# Patient Record
Sex: Female | Born: 1935
Health system: Southern US, Community
[De-identification: ages and names within clinical notes are randomized; demographics above are authoritative.]

## PROBLEM LIST (undated history)

## (undated) DIAGNOSIS — I10 Essential (primary) hypertension: Secondary | ICD-10-CM

## (undated) DIAGNOSIS — Z950 Presence of cardiac pacemaker: Secondary | ICD-10-CM

## (undated) DIAGNOSIS — E538 Deficiency of other specified B group vitamins: Secondary | ICD-10-CM

## (undated) DIAGNOSIS — I499 Cardiac arrhythmia, unspecified: Secondary | ICD-10-CM

## (undated) DIAGNOSIS — Z90710 Acquired absence of both cervix and uterus: Secondary | ICD-10-CM

## (undated) DIAGNOSIS — R0600 Dyspnea, unspecified: Secondary | ICD-10-CM

## (undated) DIAGNOSIS — IMO0001 Reserved for inherently not codable concepts without codable children: Secondary | ICD-10-CM

## (undated) DIAGNOSIS — C50919 Malignant neoplasm of unspecified site of unspecified female breast: Secondary | ICD-10-CM

## (undated) DIAGNOSIS — E1110 Type 2 diabetes mellitus with ketoacidosis without coma: Secondary | ICD-10-CM

## (undated) DIAGNOSIS — C50912 Malignant neoplasm of unspecified site of left female breast: Secondary | ICD-10-CM

## (undated) DIAGNOSIS — E785 Hyperlipidemia, unspecified: Secondary | ICD-10-CM

## (undated) DIAGNOSIS — N189 Chronic kidney disease, unspecified: Secondary | ICD-10-CM

## (undated) DIAGNOSIS — G839 Paralytic syndrome, unspecified: Secondary | ICD-10-CM

## (undated) DIAGNOSIS — Z9289 Personal history of other medical treatment: Secondary | ICD-10-CM

## (undated) DIAGNOSIS — E039 Hypothyroidism, unspecified: Secondary | ICD-10-CM

## (undated) DIAGNOSIS — Z9889 Other specified postprocedural states: Secondary | ICD-10-CM

## (undated) DIAGNOSIS — I4891 Unspecified atrial fibrillation: Secondary | ICD-10-CM

## (undated) HISTORY — DX: Essential (primary) hypertension: I10

## (undated) HISTORY — PX: BREAST LUMPECTOMY: SHX2

## (undated) HISTORY — PX: OTHER SURGICAL HISTORY: SHX169

## (undated) HISTORY — DX: Hyperlipidemia, unspecified: E78.5

## (undated) HISTORY — PX: BACK SURGERY: SHX140

## (undated) HISTORY — DX: Personal history of other medical treatment: Z92.89

## (undated) HISTORY — DX: Reserved for inherently not codable concepts without codable children: IMO0001

## (undated) HISTORY — PX: LAPAROSCOPIC CHOLECYSTECTOMY: SUR755

## (undated) HISTORY — PX: BREAST EXCISIONAL BIOPSY: SUR124

## (undated) HISTORY — DX: Acquired absence of both cervix and uterus: Z90.710

## (undated) HISTORY — DX: Other specified postprocedural states: Z98.890

## (undated) HISTORY — PX: ROTATOR CUFF REPAIR: SHX139

## (undated) HISTORY — PX: INSERT / REPLACE / REMOVE PACEMAKER: SUR710

## (undated) HISTORY — DX: Unspecified atrial fibrillation: I48.91

## (undated) HISTORY — PX: TUBAL LIGATION: SHX77

## (undated) HISTORY — DX: Type 2 diabetes mellitus with ketoacidosis without coma: E11.10

## (undated) HISTORY — PX: ABDOMINAL HYSTERECTOMY: SHX81

## (undated) HISTORY — DX: Deficiency of other specified B group vitamins: E53.8

---

## 1999-05-04 ENCOUNTER — Encounter (HOSPITAL_BASED_OUTPATIENT_CLINIC_OR_DEPARTMENT_OTHER): Payer: Self-pay | Admitting: Internal Medicine

## 1999-05-04 ENCOUNTER — Encounter: Admission: RE | Admit: 1999-05-04 | Discharge: 1999-05-04 | Payer: Self-pay | Admitting: Internal Medicine

## 1999-05-12 ENCOUNTER — Encounter (HOSPITAL_BASED_OUTPATIENT_CLINIC_OR_DEPARTMENT_OTHER): Payer: Self-pay | Admitting: Internal Medicine

## 1999-05-12 ENCOUNTER — Encounter: Admission: RE | Admit: 1999-05-12 | Discharge: 1999-05-12 | Payer: Self-pay | Admitting: Internal Medicine

## 2000-12-27 ENCOUNTER — Encounter (HOSPITAL_BASED_OUTPATIENT_CLINIC_OR_DEPARTMENT_OTHER): Payer: Self-pay | Admitting: Internal Medicine

## 2000-12-27 ENCOUNTER — Encounter: Admission: RE | Admit: 2000-12-27 | Discharge: 2000-12-27 | Payer: Self-pay | Admitting: Internal Medicine

## 2001-02-13 ENCOUNTER — Ambulatory Visit (HOSPITAL_COMMUNITY): Admission: RE | Admit: 2001-02-13 | Discharge: 2001-02-13 | Payer: Self-pay | Admitting: Gastroenterology

## 2001-10-09 ENCOUNTER — Encounter: Payer: Self-pay | Admitting: Urology

## 2001-10-14 ENCOUNTER — Observation Stay (HOSPITAL_COMMUNITY): Admission: RE | Admit: 2001-10-14 | Discharge: 2001-10-15 | Payer: Self-pay | Admitting: Urology

## 2002-05-19 ENCOUNTER — Encounter: Payer: Self-pay | Admitting: Internal Medicine

## 2002-05-19 ENCOUNTER — Encounter: Admission: RE | Admit: 2002-05-19 | Discharge: 2002-05-19 | Payer: Self-pay | Admitting: Internal Medicine

## 2003-06-12 ENCOUNTER — Encounter: Admission: RE | Admit: 2003-06-12 | Discharge: 2003-06-12 | Payer: Self-pay | Admitting: Urology

## 2003-06-22 ENCOUNTER — Ambulatory Visit (HOSPITAL_BASED_OUTPATIENT_CLINIC_OR_DEPARTMENT_OTHER): Admission: RE | Admit: 2003-06-22 | Discharge: 2003-06-22 | Payer: Self-pay | Admitting: Urology

## 2003-06-22 ENCOUNTER — Ambulatory Visit (HOSPITAL_COMMUNITY): Admission: RE | Admit: 2003-06-22 | Discharge: 2003-06-22 | Payer: Self-pay | Admitting: Urology

## 2003-12-11 ENCOUNTER — Encounter (INDEPENDENT_AMBULATORY_CARE_PROVIDER_SITE_OTHER): Payer: Self-pay | Admitting: *Deleted

## 2003-12-11 ENCOUNTER — Encounter: Admission: RE | Admit: 2003-12-11 | Discharge: 2003-12-11 | Payer: Self-pay | Admitting: Internal Medicine

## 2004-01-14 ENCOUNTER — Encounter: Admission: RE | Admit: 2004-01-14 | Discharge: 2004-01-14 | Payer: Self-pay | Admitting: Internal Medicine

## 2004-08-17 ENCOUNTER — Ambulatory Visit (HOSPITAL_COMMUNITY): Admission: RE | Admit: 2004-08-17 | Discharge: 2004-08-17 | Payer: Self-pay | Admitting: Internal Medicine

## 2004-09-13 ENCOUNTER — Observation Stay (HOSPITAL_COMMUNITY): Admission: EM | Admit: 2004-09-13 | Discharge: 2004-09-14 | Payer: Self-pay | Admitting: Emergency Medicine

## 2004-09-13 ENCOUNTER — Encounter (INDEPENDENT_AMBULATORY_CARE_PROVIDER_SITE_OTHER): Payer: Self-pay | Admitting: Specialist

## 2004-12-21 ENCOUNTER — Encounter: Admission: RE | Admit: 2004-12-21 | Discharge: 2004-12-21 | Payer: Self-pay | Admitting: Internal Medicine

## 2006-01-11 ENCOUNTER — Encounter: Admission: RE | Admit: 2006-01-11 | Discharge: 2006-01-11 | Payer: Self-pay | Admitting: Internal Medicine

## 2007-01-21 ENCOUNTER — Encounter: Admission: RE | Admit: 2007-01-21 | Discharge: 2007-01-21 | Payer: Self-pay | Admitting: Internal Medicine

## 2008-06-02 ENCOUNTER — Encounter: Admission: RE | Admit: 2008-06-02 | Discharge: 2008-06-02 | Payer: Self-pay | Admitting: Internal Medicine

## 2009-06-17 ENCOUNTER — Encounter: Admission: RE | Admit: 2009-06-17 | Discharge: 2009-06-17 | Payer: Self-pay | Admitting: Internal Medicine

## 2009-08-04 DIAGNOSIS — Z9289 Personal history of other medical treatment: Secondary | ICD-10-CM

## 2009-08-04 HISTORY — DX: Personal history of other medical treatment: Z92.89

## 2010-01-25 DIAGNOSIS — Z9889 Other specified postprocedural states: Secondary | ICD-10-CM

## 2010-01-25 HISTORY — DX: Other specified postprocedural states: Z98.890

## 2010-04-03 ENCOUNTER — Encounter: Payer: Self-pay | Admitting: Internal Medicine

## 2010-07-29 NOTE — Op Note (Signed)
[name], Summer Hawkins                            ACCOUNT NO.:  [id number]   MEDICAL RECORD NO.:  [id number]                   PATIENT TYPE:  AMB   LOCATION:  [facility]                                 FACILITY:  [facility]   PHYSICIAN:  Sigmund I. Patsi Sears, M.D.         DATE OF BIRTH:  1934-09-11   DATE OF PROCEDURE:  06/22/2003  DATE OF DISCHARGE:                                 OPERATIVE REPORT   PREOPERATIVE DIAGNOSIS:  Sphincteric urinary incontinence.   POSTOPERATIVE DIAGNOSIS:  Sphincteric urinary incontinence.   OPERATION/PROCEDURE:  Transurethral collagen injection.   SURGEON:  Sigmund I. Patsi Sears, M.D.   ANESTHESIA:  LMA.   PREPARATION:  After appropriate preanesthesia, the patient was brought to  the operating room and placed on the operating table in the dorsal supine  position.  General LMA anesthesia was introduced.  She was then replaced in  the dorsal lithotomy position where the pubis was prepped with Betadine  solution and draped in the usual fashion.   REVIEW OF HISTORY:  [name] is a 76 year old, obese, diabetic female  who has lost from 235 pounds to 195 pounds in the last year.  Status post  hysterectomy in 1980, and now status post pubovaginal sling in August 2003.  She recently had a leak point pressure of 14 cm of pressure and is  approximately 90% better after a pubovaginal sling surgery.  She now has  incontinence which has not been treatable by anticholinergics and is for  collagen injection.  The patient is status post urodynamics which show a  peak flow of 7.7 mL per second, low leak point pressure, decrease in bladder  compliance, multiple phasic contractions.  She has been treated with  multiple anticholinergics including __________ and multiple oral  medications, but these have not helped her incontinence.  She is now for  collagen injection.   DESCRIPTION OF PROCEDURE:  With the patient in the lithotomy position,  cystoscope was placed and an open  urethra and bladder neck was noted.  Two  vials of collagen were injected and excellent closure of the bladder neck  and urethra were obtained.  A 12-French catheter was used to drain the  bladder.  The patient was then awakened and taken to the recovery room in  good condition.  She had B&O suppository to start at the beginning of the  case.                                               Sigmund I. Patsi Sears, M.D.    SIT/MEDQ  D:  06/22/2003  T:  06/22/2003  Job:  [id number]

## 2010-07-29 NOTE — Op Note (Signed)
NAMEGEORGIANNE, GRITZ                ACCOUNT NO.:  [id number]   MEDICAL RECORD NO.:  [id number]          PATIENT TYPE:  INP   LOCATION:  1617                         FACILITY:  [facility]   PHYSICIAN:  Angelia Mould. Derrell Lolling, M.D.DATE OF BIRTH:  1934-07-09   DATE OF PROCEDURE:  DATE OF DISCHARGE:                                 OPERATIVE REPORT   PREOPERATIVE DIAGNOSIS:  Subacute cholecystitis with cholelithiasis.   POSTOPERATIVE DIAGNOSIS:  Subacute cholecystitis with cholelithiasis.   OPERATION PERFORMED:  Laparoscopic cholecystectomy with intraoperative  cholangiogram.   SURGEON:  Angelia Mould. Derrell Lolling, M.D.   FIRST ASSISTANT:  Velora Heckler, MD   OPERATIVE INDICATIONS:  This is a 76 year old white female with diabetes,  hypertension, hyperlipidemia, anemia, and hypothyroidism. For 3 months she  has been having intermittent episodes of right upper quadrant pain. She had  ultrasound about 1 month ago which showed several small gallstones, but no  other abnormalities. I saw her in the office about 3 weeks ago; and we had  planned elective cholecystectomy on July7. She states that she has continued  to have episodes of right upper quadrant pain and states that she had pain  all night last night. She came to the emergency room today. Her lab work was  normal and on exam she is found to be tender in the right upper quadrant,  but without any signs of peritonitis. I offered her cholecystectomy today;  and she stated that she would very much like have that done. She is brought  to operating room for that surgery.   OPERATIVE FINDINGS:  The gallbladder was thin-walled and chronically  inflamed but was not acutely inflamed. The cystic duct was tiny. The  cholangiogram was normal. The biliary tree was very small caliber, the  anatomy of the biliary tree was normal, the cholangiogram showed no filling  defects and no blockage with good flow of contrast into the duodenum. The  liver, stomach,  duodenum, small intestine and large intestine were grossly  normal to inspection.  I saw no evidence of any other inflammatory process.   OPERATIVE TECHNIQUE:  Following induction of general endotracheal  anesthesia. Intravenous antibiotics were given. The abdomen was prepped and  draped in sterile fashion. Marcaine 0.5% with epinephrine was used as a  local infiltration anesthetic. Transverse incision was made at the lower rim  of the umbilicus through a previous laparoscopy scar. The fascia was incised  in midline. The abdominal cavity was entered under direct vision. A 10-mm  Hassan trocar was inserted and secured with a pursestring suture of #0  Vicryl. A pneumoperitoneum was created. Video camera was inserted with  visualization and findings as described above. A 10-mm camera was inserted.  We found no abnormalities.   A 10-mm trocar was placed in the subxiphoid region and two 5-mm trocars  placed in the right midabdomen. The gallbladder fundus was elevated. We had  a lot of adhesions of omentum and duodenum to the gallbladder, but these  were chronic adhesions. These were taken down in a very straight-forward  fashion without any injury. The infundibulum was thus  identified and  retracted laterally. We dissected out the cystic duct and the cystic artery.  We created a large window behind the cystic duct. A metal clip was placed on  the cystic duct close to the gallbladder. A cholangiogram catheter was  inserted into the cystic duct. A cholangiogram was obtained using the C-arm.  This showed normal intrahepatic and extrahepatic biliary anatomy, although  the bile ducts were very small in caliber. There was no filling defect and  no obstruction. The cholangiogram catheter was removed.   The cystic duct was secured with multiple metal clips and divided. The  cystic artery was then secured with multiple metal clips and divided. The  gallbladder was dissected from its bed. We created  one small hole in the  gallbladder and spilled some clear bile. This was evacuated. The gallbladder  was placed in the specimen bag and removed. The operative field was  inspected and copiously irrigated. After irrigating extensively, the  irrigation fluid was completely clear, there was no bleeding and no bile  leak whatsoever. The trocars were removed under direct vision; and there was  no bleeding from trocar sites. The pneumoperitoneum was released. The fascia  at the umbilicus was closed with #0 Vicryl sutures. Skin incisions were  closed with subcuticular sutures of 4-0 Monocryl and interrupted sutures of  4-0 nylon. Clean bandages were placed; and the patient taken recovery room  in stable condition. Estimated blood loss was about 10 mL. Complications  were none  Sponge and needle counts were correct.       HMI/MEDQ  D:  09/13/2004  T:  09/13/2004  Job:  [id number]   cc:   Gaspar Garbe, M.D.  [address]  [place]  [place] [address]  Fax: [phone]

## 2010-07-29 NOTE — Op Note (Signed)
Summer Hawkins, Summer Hawkins                         ACCOUNT NO.:  [id number]   MEDICAL RECORD NO.:  [id number]                   PATIENT TYPE:  AMB   LOCATION:  DAY                                  FACILITY:  [facility]   PHYSICIAN:  Sigmund I. Patsi Sears, M.D.         DATE OF BIRTH:  [birth year]   DATE OF PROCEDURE:  DATE OF DISCHARGE:                                 OPERATIVE REPORT   PREOPERATIVE DIAGNOSES:  Urinary sphincteric incontinence.   POSTOPERATIVE DIAGNOSES:  Urinary sphincteric incontinence.   OPERATION:  Sparc pubovaginal sling.   SURGEON:  Sigmund I. Patsi Sears, M.D.   ANESTHESIA:  General (LMA).   PREPARATION:  After appropriate anesthesia, the patient was brought to the  operating room and placed on the operative table in the dorsal supine  position.  General LMA anesthesia was introduced.  She was then re-placed in  the dorsal lithotomy position, where the pubis was prepped with Betadine  solution and draped in usual sterile fashion.   REVIEW OF HISTORY:  This 76 year old obese female weighs 233 pounds,  diabetic with hypertension, is status post hysterectomy in 1980.  Currently  complains of urinary incontinence, mixed urge and stress.  She had  urodynamics showing a 14 cm leak point pressures, and has not been cured  with anticholinergics.  She is now for a pubovaginal sling.   PHYSICAL EXAMINATION:  Did not show a symptomatic cystocele or rectocele or  enterocele.   The patient is a four pad/day incontinence.   PROCEDURE:  Vaginal inspection under anesthesia again shows minimal, grade 1  cystocele, and no rectocele and no enterocele.  It was elected to proceed  with Sparc pubovaginal sling, and 4 cc of Marcaine 0.5% with epinephrine  1:200,000 was injected into the midline along the urethral surface of the  vaginal mucosa.  A 2 cm incision was made in the midline of the urethra,  distal to the palpable bladder neck.  Dissection was accomplished  bilaterally,  and bilateral pubic incisions are made two fingerbreadths above  the pubic tubercle on the right and left sides of the pelvis.  These  incisions measure approximately 1 cm each.  Subcutaneous tissue was  dissected with electrosurgical __________ .  Using the special Sparc  needles, the Sparc sling was brought into the retropubic space, with a right  angle behind the midline of the Sparc itself.  Each Sparc sleeve is cut, and  the plastic removed.  A right angled clamp is kept behind the midline to  ensure that the Sparc is not too tight.  Following this, cystoscopy had  previously been accomplished with the needles in place, with both the 12-  degree and 7-degree lenses; showing that there was no evidence of any needle  within her bladder.  The Sparc sleeves were cut below the level of the  dermis, and the suprapubic wounds closed with skin stapler.  The vaginal mucosa was closed with running  3-0 Vicryl suture.  Repeat  cystoscopy was accomplished with 12 and  7-degree lenses, and again showed no evidence of any suture or sling within  the bladder.  No bleeding was noted.  Sterile dressing was applied.  The  patient was awakened and taken to the recovery room in good condition.  She  was given B&O suppository at anesthesia induction, and IV Toradol prior to  awakening.                                                Sigmund I. Patsi Sears, M.D.    SIT/MEDQ  D:  10/14/2001  T:  10/17/2001  Job:  [id number]

## 2010-08-01 ENCOUNTER — Other Ambulatory Visit: Payer: Self-pay | Admitting: Internal Medicine

## 2010-08-01 DIAGNOSIS — Z1231 Encounter for screening mammogram for malignant neoplasm of breast: Secondary | ICD-10-CM

## 2010-08-16 ENCOUNTER — Ambulatory Visit
Admission: RE | Admit: 2010-08-16 | Discharge: 2010-08-16 | Disposition: A | Discharge status: Home / Self Care | Payer: Medicare Other | Source: Ambulatory Visit | Attending: Internal Medicine | Admitting: Internal Medicine

## 2010-08-16 DIAGNOSIS — Z1231 Encounter for screening mammogram for malignant neoplasm of breast: Secondary | ICD-10-CM

## 2011-01-04 ENCOUNTER — Other Ambulatory Visit (HOSPITAL_COMMUNITY): Payer: Self-pay | Admitting: Internal Medicine

## 2011-01-04 DIAGNOSIS — R0789 Other chest pain: Secondary | ICD-10-CM

## 2011-01-05 ENCOUNTER — Ambulatory Visit (HOSPITAL_COMMUNITY): Payer: Medicare Other | Attending: Internal Medicine | Admitting: Radiology

## 2011-01-05 ENCOUNTER — Ambulatory Visit (INDEPENDENT_AMBULATORY_CARE_PROVIDER_SITE_OTHER): Payer: Medicare Other | Admitting: Cardiovascular Disease

## 2011-01-05 VITALS — Ht 64.0 in | Wt 227.0 lb

## 2011-01-05 VITALS — BP 132/80 | HR 115 | Resp 12

## 2011-01-05 DIAGNOSIS — R079 Chest pain, unspecified: Secondary | ICD-10-CM | POA: Insufficient documentation

## 2011-01-05 DIAGNOSIS — R0989 Other specified symptoms and signs involving the circulatory and respiratory systems: Secondary | ICD-10-CM

## 2011-01-05 DIAGNOSIS — I4891 Unspecified atrial fibrillation: Secondary | ICD-10-CM

## 2011-01-05 DIAGNOSIS — R0609 Other forms of dyspnea: Secondary | ICD-10-CM

## 2011-01-05 DIAGNOSIS — I482 Chronic atrial fibrillation, unspecified: Secondary | ICD-10-CM | POA: Insufficient documentation

## 2011-01-05 DIAGNOSIS — R0789 Other chest pain: Secondary | ICD-10-CM

## 2011-01-05 MED ORDER — REGADENOSON 0.4 MG/5ML IV SOLN: 0.4000 mg | Freq: Once | INTRAVENOUS | Status: DC

## 2011-01-05 MED ORDER — REGADENOSON 0.4 MG/5ML IV SOLN
0.4000 mg | Freq: Once | INTRAVENOUS | Status: AC
  Administered 2011-01-05: 0.4 mg via INTRAVENOUS

## 2011-01-05 MED ORDER — METOPROLOL SUCCINATE ER 25 MG PO TB24: 25.0000 mg | ORAL_TABLET | Freq: Every day | ORAL | Status: DC

## 2011-01-05 MED ORDER — TECHNETIUM TC 99M TETROFOSMIN IV KIT
11.0000 | PACK | Freq: Once | INTRAVENOUS | Status: AC | PRN
  Administered 2011-01-05: 11 via INTRAVENOUS

## 2011-01-05 MED ORDER — DABIGATRAN ETEXILATE MESYLATE 150 MG PO CAPS: 150.0000 mg | ORAL_CAPSULE | Freq: Two times a day (BID) | ORAL | Status: DC

## 2011-01-05 MED ORDER — TECHNETIUM TC 99M TETROFOSMIN IV KIT
33.0000 | PACK | Freq: Once | INTRAVENOUS | Status: AC | PRN
  Administered 2011-01-05: 33 via INTRAVENOUS

## 2011-01-05 NOTE — Assessment & Plan Note (Signed)
New onset atrial fibrillation. She is asymptomatic. I have spent 30 minutes with the patient and her husband reviewing the mechanism of atrial fibrillation and the potential for stroke. She is willing to start Pradaxa for anticoagulation. I will also start Toprol XL 25 mg po once daily. I will arrange an echo. We will check a CBC today. Continue other meds. I think her atrial fibrillation likely explains her diaphoresis and weakness. It is unclear how long she has been in this rhythm.

## 2011-01-05 NOTE — Progress Notes (Signed)
[facility] SITE 3 NUCLEAR MED [address] [place] [address] [phone]  Cardiology Nuclear Med Study  Summer Hawkins is a 76 y.o. female [id number] 1934/07/16   Nuclear Med Background Indication for Stress Test:  Evaluation for Ischemia History:  ~15 yrs ago GXT:OK per patient Cardiac Risk Factors: Family History - CAD, Hypertension, Lipids, NIDDM and Obesity  Symptoms:  Diaphoresis with Exertion, DOE, Fatigue and Fatigue with Exertion   Nuclear Pre-Procedure Caffeine/Decaff Intake:  None NPO After: 7:00am   Lungs:  Clear.  O2 SAT 94% on RA. IV 0.9% NS with Angio Cath:  22g  IV Site: R Hand  IV Started by:  Bonnita Levan, RN  Chest Size (in):  48 Cup Size: C  Height: 5\' 4"  (1.626 m)  Weight:  227 lb (102.967 kg)  BMI:  Body mass index is 38.96 kg/(m^2). Tech Comments:  Patient took all medications today.    Nuclear Med Study 1 or 2 day study: 1 day  Stress Test Type:  Stress  Reading MD: Charlton Haws, MD  Order Authorizing Provider:  Guerry Bruin, MD  Resting Radionuclide: Technetium 49m Tetrofosmin  Resting Radionuclide Dose: 11.0 mCi   Stress Radionuclide:  Technetium 66m Tetrofosmin  Stress Radionuclide Dose: 33.0 mCi           Stress Protocol Rest HR: 113-128 Stress HR: 150  Rest BP: 140/95 Stress BP: 138/89  Exercise Time (min): n/a METS: n/a   Predicted Max HR: 146 bpm % Max HR: 102.74 bpm Rate Pressure Product: 78469   Dose of Adenosine (mg):  n/a Dose of Lexiscan: 0.4 mg  Dose of Atropine (mg): n/a Dose of Dobutamine: n/a mcg/kg/min (at max HR)  Stress Test Technologist: Smiley Houseman, CMA-N  Nuclear Technologist:  Domenic Polite, CNMT     Rest Procedure:  Myocardial perfusion imaging was performed at rest 45 minutes following the intravenous administration of Technetium 72m Tetrofosmin.  Rest ECG:  New Atrial Fibrilliation with RVR, rare PVC.  Spoke with Dr. Clifton James, DOD, and he said to complete study with Lexiscan and he  will see patient after study is completed.  Stress Procedure:  The patient received IV Lexiscan 0.4 mg over 15-seconds.  Technetium 31m Tetrofosmin injected at 30-seconds.  There were nonspecific T-wave changes and occasional PVC's with Lexiscan.  Quantitative spect images were obtained after a 45 minute delay.  Stress ECG: No significant change from baseline ECG  QPS Raw Data Images:  Patient motion noted. Stress Images:  There is decreased uptake in the anterior wall. Rest Images:  There is decreased uptake in the anterior wall. Subtraction (SDS):  Motion but breast artifact not apparent on RAW images Transient Ischemic Dilatation (Normal <1.22):  1.03 Lung/Heart Ratio (Normal <0.45):  0.36  Quantitative Gated Spect Images QGS EDV:  69 ml QGS ESV:  33 ml QGS cine images:  NL LV Function; NL Wall Motion QGS EF: 52%  Impression Exercise Capacity:  Lexiscan with no exercise. BP Response:  Normal blood pressure response. Clinical Symptoms:  No chest pain. ECG Impression:  Afib with nonspecific ST/T wave changes Comparison with Prior Nuclear Study: No previous nuclear study performed  Overall Impression:  Low risk study.  Small area of apical reversibility seen only on short axis and small area of midanterior wall reversibility seen only on vertical images.  This suggests artifact.  SDS 1 and normal wall motion       Charlton Haws

## 2011-01-05 NOTE — Progress Notes (Signed)
History of Present Illness:76 yo WF with history of DM, hypothyroidism, HTN, HLD, Vitamin B12 deficiency, iron deficiency here today as an add on to my schedule. She presented for her stress test as ordered by Dr. Wylene Simmer and was found to be in atrial fibrillation with HR of 115. She tells me that she feels well. NO awareness of palpitations. She has noticed frequent episodes of diaphoresis. This is associated with weakness. She has no chest pains or SOB. She has been coughing for several months. No fever or chills. She has no personal history of CAD or heart disease but she does have a strong family history of CAD. She has 7 aunts and uncles who died suddenly from heart issues. She has 2 brothers who have had CAD/MI. No stress induced ischemia on nuclear study today. Low risk study. Normal LVEF.   Past Medical History  Diagnosis Date  . Hyperthyroidism   . Hyperlipidemia   . Hypertension   . H/O: hysterectomy   . Ketoacidosis, diabetic, no coma, non-insulin dependent   . Vitamin B12 deficiency   . Glaucoma 2012    [place]  . History of colonoscopy 01/25/2010  . History of mammogram 08/04/2009  . Diabetes mellitus     Past Surgical History  Procedure Date  . Bilateral foot surgery   . Abdominal hysterectomy   . Tubal ligation   . Rotator cuff repair   . Laparoscopic cholecystectomy     Current Outpatient Prescriptions  Medication Sig Dispense Refill  . cyanocobalamin 100 MCG tablet Take 100 mcg by mouth daily.        . ferrous sulfate 325 (65 FE) MG tablet Take 325 mg by mouth daily with breakfast.        . levothyroxine (SYNTHROID, LEVOTHROID) 125 MCG tablet Take 125 mcg by mouth daily.        Marland Kitchen linagliptin (TRADJENTA) 5 MG TABS tablet Take 5 mg by mouth daily.        Marland Kitchen lisinopril (PRINIVIL,ZESTRIL) 40 MG tablet Take 40 mg by mouth daily.        . metFORMIN (GLUCOPHAGE) 850 MG tablet Take 850 mg by mouth 2 (two) times daily with a meal.        . Multiple Vitamins-Minerals  (MULTIVITAMIN WITH MINERALS) tablet Take 1 tablet by mouth daily.        . rosuvastatin (CRESTOR) 10 MG tablet Take 5 mg by mouth daily.        . solifenacin (VESICARE) 5 MG tablet Take 10 mg by mouth daily.        . dabigatran (PRADAXA) 150 MG CAPS Take 1 capsule (150 mg total) by mouth every 12 (twelve) hours.  60 capsule  6  . metoprolol succinate (TOPROL XL) 25 MG 24 hr tablet Take 1 tablet (25 mg total) by mouth daily.  30 tablet  6   No current facility-administered medications for this visit.   Facility-Administered Medications Ordered in Other Visits  Medication Dose Route Frequency Provider Last Rate Last Dose  . regadenoson (LEXISCAN) injection SOLN 0.4 mg  0.4 mg Intravenous Once Gaspar Garbe, MD   0.4 mg at 01/05/11 1410  . regadenoson (LEXISCAN) injection SOLN 0.4 mg  0.4 mg Intravenous Once Gaspar Garbe, MD      . technetium tetrofosmin (TC-MYOVIEW) injection 11 milli Curie  11 milli Curie Intravenous Once PRN Wendall Stade, MD   11 milli Curie at 01/05/11 1230  . technetium tetrofosmin (TC-MYOVIEW) injection 33 milli Curie  33  milli Curie Intravenous Once PRN Wendall Stade, MD   33 milli Curie at 01/05/11 1410    Allergies  Allergen Reactions  . Penicillins     History   Social History  . Marital Status: Married    Spouse Name: N/A    Number of Children: N/A  . Years of Education: N/A   Occupational History  . Not on file.   Social History Main Topics  . Smoking status: Never Smoker   . Smokeless tobacco: Not on file  . Alcohol Use: No  . Drug Use: No  . Sexually Active: Not on file   Other Topics Concern  . Not on file   Social History Narrative  . No narrative on file    No family history on file.  Review of Systems:  As stated in the HPI and otherwise negative.   BP 132/80  Pulse 115  Physical Examination: General: Well developed, well nourished, NAD HEENT: OP clear, mucus membranes moist SKIN: warm, dry. No rashes. Neuro: No  focal deficits Musculoskeletal: Muscle strength 5/5 all ext Psychiatric: Mood and affect normal Neck: No JVD, no carotid bruits, no thyromegaly, no lymphadenopathy. Lungs:Clear bilaterally, no wheezes, rhonci, crackles Cardiovascular: Irregular rate and rhythm. No murmurs, gallops or rubs. Abdomen:Soft. Bowel sounds present. Non-tender.  Extremities: No lower extremity edema. Pulses are 2 + in the bilateral DP/PT.  FAO:ZHYQMV fibrillation, 113 bpm.   Stress myoview: No ischemia. LVEF 52%. Apical thinning. Low risk study.

## 2011-01-05 NOTE — Patient Instructions (Signed)
Your physician recommends that you schedule a follow-up appointment in: 10-14 days  Your physician has requested that you have an echocardiogram. Echocardiography is a painless test that uses sound waves to create images of your heart. It provides your doctor with information about the size and shape of your heart and how well your heart's chambers and valves are working. This procedure takes approximately one hour. There are no restrictions for this procedure.   Your physician has recommended you make the following change in your medication:   Start Pradaxa 150 mg by mouth twice daily. Start Toprol XL 25 mg by mouth daily.

## 2011-01-06 LAB — CBC WITH DIFFERENTIAL/PLATELET
Basophils Relative: 0.2 % (ref 0.0–3.0)
HCT: 45.5 % (ref 36.0–46.0)
Lymphocytes Relative: 27.5 % (ref 12.0–46.0)
MCHC: 34.2 g/dL (ref 30.0–36.0)
MCV: 92.6 fl (ref 78.0–100.0)
Monocytes Relative: 7.1 % (ref 3.0–12.0)
Neutrophils Relative %: 58.3 % (ref 43.0–77.0)
Platelets: 224 10*3/uL (ref 150.0–400.0)

## 2011-01-06 NOTE — Progress Notes (Signed)
COPY OF NUCLEAR STUDY AND OFFICE NOTE FAXED TO DR. TISOVEC @ [phone]

## 2011-01-12 ENCOUNTER — Ambulatory Visit (HOSPITAL_COMMUNITY): Payer: Medicare Other | Attending: Cardiovascular Disease

## 2011-01-12 DIAGNOSIS — I4891 Unspecified atrial fibrillation: Secondary | ICD-10-CM

## 2011-01-12 DIAGNOSIS — I059 Rheumatic mitral valve disease, unspecified: Secondary | ICD-10-CM | POA: Insufficient documentation

## 2011-01-12 DIAGNOSIS — I079 Rheumatic tricuspid valve disease, unspecified: Secondary | ICD-10-CM | POA: Insufficient documentation

## 2011-01-12 DIAGNOSIS — I10 Essential (primary) hypertension: Secondary | ICD-10-CM | POA: Insufficient documentation

## 2011-01-12 DIAGNOSIS — I379 Nonrheumatic pulmonary valve disorder, unspecified: Secondary | ICD-10-CM | POA: Insufficient documentation

## 2011-01-12 DIAGNOSIS — E119 Type 2 diabetes mellitus without complications: Secondary | ICD-10-CM | POA: Insufficient documentation

## 2011-01-12 DIAGNOSIS — E785 Hyperlipidemia, unspecified: Secondary | ICD-10-CM | POA: Insufficient documentation

## 2011-01-18 ENCOUNTER — Encounter: Payer: Self-pay | Admitting: Cardiovascular Disease

## 2011-01-18 ENCOUNTER — Ambulatory Visit (INDEPENDENT_AMBULATORY_CARE_PROVIDER_SITE_OTHER): Payer: Medicare Other | Admitting: Cardiovascular Disease

## 2011-01-18 VITALS — BP 150/81 | HR 80 | Ht 64.0 in | Wt 231.0 lb

## 2011-01-18 DIAGNOSIS — I4891 Unspecified atrial fibrillation: Secondary | ICD-10-CM

## 2011-01-18 NOTE — Assessment & Plan Note (Signed)
She remains in atrial fibrillation today with good rate control on Toprol XL. Her echo shows normal LV function but she does have biatrial enlargement. Continue Pradaxa for anticoagulation. I will see her back in one month. If she is still in atrial fibrillation, will plan cardioversion at that time.

## 2011-01-18 NOTE — Patient Instructions (Signed)
Your physician recommends that you schedule a follow-up appointment in: 1 month. Your physician recommends that you continue on your current medications as directed. Please refer to the Current Medication list given to you today. 

## 2011-01-18 NOTE — Progress Notes (Signed)
History of Present Illness:76 yo WF with history of DM, hypothyroidism, HTN, HLD, Vitamin B12 deficiency, iron deficiency here today for cardiac followup. I saw her two weeks ago as a new patient. She presented for her stress test as ordered by Dr. Wylene Simmer and was found to be in atrial fibrillation with HR of 115. She told  me that she felt well. NO awareness of palpitations. She has noticed frequent episodes of diaphoresis. This is associated with weakness. She has no chest pains or SOB. She has been coughing for several months. No fever or chills. She has no personal history of CAD or heart disease but she does have a strong family history of CAD. She has 7 aunts and uncles who died suddenly from heart issues. She has 2 brothers who have had CAD/MI. No stress induced ischemia on nuclear study. Low risk study. Normal LVEF. I started Toprol XL and Pradaxa. Echo on 01/12/11 with normal LV size and function, LVEF of 55-60%, mild LVH, moderate TR, mild MR.   She is here today for two week f/u. She has been taking Pradaxa. No bleeding issues. She has no awareness of irregularity of her heart rhythm. She has no chest pain or SOB.    Past Medical History  Diagnosis Date  . Hyperthyroidism   . Hyperlipidemia   . Hypertension   . H/O: hysterectomy   . Ketoacidosis, diabetic, no coma, non-insulin dependent   . Vitamin B12 deficiency   . Glaucoma 2012    [place]  . History of colonoscopy 01/25/2010  . History of mammogram 08/04/2009  . Diabetes mellitus     Past Surgical History  Procedure Date  . Bilateral foot surgery   . Abdominal hysterectomy   . Tubal ligation   . Rotator cuff repair   . Laparoscopic cholecystectomy     Current Outpatient Prescriptions  Medication Sig Dispense Refill  . cyanocobalamin 100 MCG tablet Take 100 mcg by mouth daily.        . dabigatran (PRADAXA) 150 MG CAPS Take 1 capsule (150 mg total) by mouth every 12 (twelve) hours.  60 capsule  6  . ferrous sulfate  325 (65 FE) MG tablet Take 325 mg by mouth daily with breakfast.        . levothyroxine (SYNTHROID, LEVOTHROID) 125 MCG tablet Take 125 mcg by mouth daily.        Marland Kitchen linagliptin (TRADJENTA) 5 MG TABS tablet Take 5 mg by mouth daily.        Marland Kitchen lisinopril (PRINIVIL,ZESTRIL) 40 MG tablet Take 40 mg by mouth daily.        . metFORMIN (GLUCOPHAGE) 850 MG tablet Take 850 mg by mouth 2 (two) times daily with a meal.        . metoprolol succinate (TOPROL XL) 25 MG 24 hr tablet Take 1 tablet (25 mg total) by mouth daily.  30 tablet  6  . Multiple Vitamins-Minerals (MULTIVITAMIN WITH MINERALS) tablet Take 1 tablet by mouth daily.        . rosuvastatin (CRESTOR) 10 MG tablet Take 5 mg by mouth daily.        . solifenacin (VESICARE) 5 MG tablet Take 10 mg by mouth daily.          Allergies  Allergen Reactions  . Penicillins     History   Social History  . Marital Status: Married    Spouse Name: N/A    Number of Children: 2  . Years of Education: N/A  Occupational History  .  Other    Worked at [employer] History Main Topics  . Smoking status: Never Smoker   . Smokeless tobacco: Not on file  . Alcohol Use: No  . Drug Use: No  . Sexually Active: Not on file   Other Topics Concern  . Not on file   Social History Narrative   Patient since [phone] with prostate cancer10-siblings-no cancer    Family History  Problem Relation Age of Onset  . Heart failure Father 83    enlarged heart  . Pneumonia Mother 34  . Diabetes Mother 31  . Cancer Neg Hx     Review of Systems:  As stated in the HPI and otherwise negative.   BP 150/81  Pulse 80  Ht 5\' 4"  (1.626 m)  Wt 231 lb (104.781 kg)  BMI 39.65 kg/m2  Physical Examination: General: Well developed, well nourished, NAD HEENT: OP clear, mucus membranes moist SKIN: warm, dry. No rashes. Neuro: No focal deficits Musculoskeletal: Muscle strength 5/5 all ext Psychiatric: Mood and affect normal Neck: No JVD, no carotid bruits,  no thyromegaly, no lymphadenopathy. Lungs:Irregular, no wheezes, rhonci, crackles Cardiovascular: Regular rate and rhythm. No murmurs, gallops or rubs. Abdomen:Soft. Bowel sounds present. Non-tender.  Extremities: No lower extremity edema. Pulses are 2 + in the bilateral DP/PT.  YNW:GNFAOZ fibrillation, rate 80 bpm.   Echo 01/12/11:  Left ventricle: The cavity size was normal. Wall thickness was increased in a pattern of mild LVH. Systolic function was normal. The estimated ejection fraction was in the range of 55% to 65%. Wall motion was normal; there were no regional wall motion abnormalities. - Mitral valve: Calcified annulus. Mild regurgitation. - Left atrium: The atrium was moderately dilated. - Right atrium: The atrium was mildly dilated. - Tricuspid valve: Moderate regurgitation. - Pulmonary arteries: Systolic pressure was mildly increased.

## 2011-02-20 ENCOUNTER — Ambulatory Visit (INDEPENDENT_AMBULATORY_CARE_PROVIDER_SITE_OTHER): Payer: Medicare Other | Admitting: Cardiovascular Disease

## 2011-02-20 ENCOUNTER — Encounter: Payer: Self-pay | Admitting: *Deleted

## 2011-02-20 ENCOUNTER — Encounter (HOSPITAL_COMMUNITY): Payer: Self-pay | Admitting: Respiratory Therapy

## 2011-02-20 ENCOUNTER — Encounter: Payer: Self-pay | Admitting: Cardiovascular Disease

## 2011-02-20 VITALS — BP 140/83 | HR 61 | Ht 64.0 in | Wt 233.0 lb

## 2011-02-20 DIAGNOSIS — I4891 Unspecified atrial fibrillation: Secondary | ICD-10-CM

## 2011-02-20 NOTE — Progress Notes (Signed)
History of Present Illness: 76 yo WF with history of DM, hypothyroidism, HTN, HLD, Vitamin B12 deficiency, iron deficiency here today for cardiac followup. I saw her two weeks ago as a new patient. She presented for her stress test as ordered by Dr. Wylene Simmer and was found to be in atrial fibrillation with HR of 115. She told me that she felt well. NO awareness of palpitations. She has noticed frequent episodes of diaphoresis. This is associated with weakness. She has no chest pains or SOB. She has been coughing for several months. No fever or chills. She has no personal history of CAD or heart disease but she does have a strong family history of CAD. She has 7 aunts and uncles who died suddenly from heart issues. She has 2 brothers who have had CAD/MI. No stress induced ischemia on nuclear study. Low risk study. Normal LVEF. I started Toprol XL and Pradaxa. Echo on 01/12/11 with normal LV size and function, LVEF of 55-60%, mild LVH, moderate TR, mild MR.   She is here today for follow up.  She has been taking Pradaxa for one month. No bleeding issues. She has no awareness of irregularity of her heart rhythm. She has no chest pain or SOB.    Past Medical History  Diagnosis Date  . Hyperthyroidism   . Hyperlipidemia   . Hypertension   . H/O: hysterectomy   . Ketoacidosis, diabetic, no coma, non-insulin dependent   . Vitamin B12 deficiency   . Glaucoma 2012    [place]  . History of colonoscopy 01/25/2010  . History of mammogram 08/04/2009  . Diabetes mellitus   . Atrial fibrillation     Past Surgical History  Procedure Date  . Bilateral foot surgery   . Abdominal hysterectomy   . Tubal ligation   . Rotator cuff repair   . Laparoscopic cholecystectomy     Current Outpatient Prescriptions  Medication Sig Dispense Refill  . cyanocobalamin 100 MCG tablet Take 100 mcg by mouth daily.        . ferrous sulfate 325 (65 FE) MG tablet Take 325 mg by mouth daily with breakfast.        .  levothyroxine (SYNTHROID, LEVOTHROID) 125 MCG tablet Take 125 mcg by mouth daily.        Marland Kitchen linagliptin (TRADJENTA) 5 MG TABS tablet Take 5 mg by mouth daily.        Marland Kitchen lisinopril (PRINIVIL,ZESTRIL) 40 MG tablet Take 40 mg by mouth daily.        . metFORMIN (GLUCOPHAGE) 850 MG tablet Take 850 mg by mouth 2 (two) times daily with a meal.        . metoprolol succinate (TOPROL XL) 25 MG 24 hr tablet Take 1 tablet (25 mg total) by mouth daily.  30 tablet  6  . Multiple Vitamins-Minerals (MULTIVITAMIN WITH MINERALS) tablet Take 1 tablet by mouth daily.        . rosuvastatin (CRESTOR) 10 MG tablet Take 5 mg by mouth daily.        . solifenacin (VESICARE) 5 MG tablet Take 10 mg by mouth daily.        . dabigatran (PRADAXA) 150 MG CAPS Take 1 capsule (150 mg total) by mouth every 12 (twelve) hours.  60 capsule  6    Allergies  Allergen Reactions  . Penicillins     History   Social History  . Marital Status: Married    Spouse Name: N/A    Number of Children:  2  . Years of Education: N/A   Occupational History  .  Other    Worked at [employer] History Main Topics  . Smoking status: Never Smoker   . Smokeless tobacco: Not on file  . Alcohol Use: No  . Drug Use: No  . Sexually Active: Not on file   Other Topics Concern  . Not on file   Social History Narrative   Patient since [phone] with prostate cancer10-siblings-no cancer    Family History  Problem Relation Age of Onset  . Heart failure Father 83    enlarged heart  . Pneumonia Mother 46  . Diabetes Mother 76  . Cancer Neg Hx     Review of Systems:  As stated in the HPI and otherwise negative.   BP 140/83  Pulse 61  Ht 5\' 4"  (1.626 m)  Wt 233 lb (105.688 kg)  BMI 39.99 kg/m2  Physical Examination: General: Well developed, well nourished, NAD HEENT: OP clear, mucus membranes moist SKIN: warm, dry. No rashes. Neuro: No focal deficits Musculoskeletal: Muscle strength 5/5 all ext Psychiatric: Mood and affect  normal Neck: No JVD, no carotid bruits, no thyromegaly, no lymphadenopathy. Lungs:Clear bilaterally, no wheezes, rhonci, crackles Cardiovascular: Irregular. No murmurs, gallops or rubs. Abdomen:Soft. Bowel sounds present. Non-tender.  Extremities: No lower extremity edema. Pulses are 2 + in the bilateral DP/PT.  EKG: Atrial fibrillation, rate 73 bpm. Non-specific T wave changes.

## 2011-02-20 NOTE — Assessment & Plan Note (Addendum)
She remains in atrial fibrillation today. She is rate controlled. She has been anti-coagulated with Pradaxa for over one month. She is asymptomatic. I have discussed long term rate control with anti-coagulation versus arranging DCCV to attempt to restore sinus rhythm. She wishes to proceed with cardioversion. She cannot do it this week. I will be out of town next week. I will arrange for Monday December 17th with Dr. Shirlee Latch. I have reviewed the risks and potential benefits of the procedure. Will check BMET and CBC today.

## 2011-02-20 NOTE — Patient Instructions (Signed)
Your physician recommends that you schedule a follow-up appointment in: 1 month.  Your physician has recommended that you have a Cardioversion (DCCV). Electrical Cardioversion uses a jolt of electricity to your heart either through paddles or wired patches attached to your chest. This is a controlled, usually prescheduled, procedure. Defibrillation is done under light anesthesia in the hospital, and you usually go home the day of the procedure. This is done to get your heart back into a normal rhythm. You are not awake for the procedure. Please see the instruction sheet given to you today. Scheduled for February 27, 2011

## 2011-02-21 LAB — BASIC METABOLIC PANEL
CO2: 27 mEq/L (ref 19–32)
Calcium: 8.9 mg/dL (ref 8.4–10.5)
Creatinine, Ser: 0.9 mg/dL (ref 0.4–1.2)
GFR: 66.6 mL/min (ref >60.00)
Potassium: 4.7 mEq/L (ref 3.5–5.1)

## 2011-02-21 LAB — CBC WITH DIFFERENTIAL/PLATELET
HCT: 42.9 % (ref 36.0–46.0)
Hemoglobin: 14.5 g/dL (ref 12.0–15.0)
Lymphocytes Relative: 34.9 % (ref 12.0–46.0)
MCHC: 33.8 g/dL (ref 30.0–36.0)
MCV: 92.9 fl (ref 78.0–100.0)
Monocytes Relative: 9.3 % (ref 3.0–12.0)
Neutro Abs: 4.8 10*3/uL (ref 1.4–7.7)
Neutrophils Relative %: 45.6 % (ref 43.0–77.0)
Platelets: 219 10*3/uL (ref 150.0–400.0)
RBC: 4.62 Mil/uL (ref 3.87–5.11)
WBC: 10.5 10*3/uL (ref 4.5–10.5)

## 2011-02-27 ENCOUNTER — Encounter (HOSPITAL_COMMUNITY): Payer: Self-pay

## 2011-02-27 ENCOUNTER — Encounter (HOSPITAL_COMMUNITY)
Admission: RE | Disposition: A | Discharge status: Home / Self Care | Payer: Self-pay | Source: Ambulatory Visit | Attending: Cardiology

## 2011-02-27 ENCOUNTER — Other Ambulatory Visit: Payer: Self-pay

## 2011-02-27 ENCOUNTER — Ambulatory Visit (HOSPITAL_COMMUNITY): Payer: Medicare Other

## 2011-02-27 ENCOUNTER — Ambulatory Visit (HOSPITAL_COMMUNITY)
Admission: RE | Admit: 2011-02-27 | Discharge: 2011-02-27 | Disposition: A | Discharge status: Home / Self Care | Payer: Medicare Other | Source: Ambulatory Visit | Attending: Cardiology | Admitting: Cardiology

## 2011-02-27 DIAGNOSIS — I4891 Unspecified atrial fibrillation: Secondary | ICD-10-CM

## 2011-02-27 DIAGNOSIS — I10 Essential (primary) hypertension: Secondary | ICD-10-CM | POA: Insufficient documentation

## 2011-02-27 DIAGNOSIS — E119 Type 2 diabetes mellitus without complications: Secondary | ICD-10-CM | POA: Insufficient documentation

## 2011-02-27 DIAGNOSIS — E0590 Thyrotoxicosis, unspecified without thyrotoxic crisis or storm: Secondary | ICD-10-CM | POA: Insufficient documentation

## 2011-02-27 DIAGNOSIS — Z01810 Encounter for preprocedural cardiovascular examination: Secondary | ICD-10-CM | POA: Insufficient documentation

## 2011-02-27 HISTORY — PX: CARDIOVERSION: SHX1299

## 2011-02-27 LAB — GLUCOSE, CAPILLARY: Glucose-Capillary: 162 mg/dL — ABNORMAL HIGH (ref 70–99)

## 2011-02-27 SURGERY — CARDIOVERSION
Anesthesia: General | Wound class: Clean

## 2011-02-27 MED ORDER — SODIUM CHLORIDE 0.9 % IV SOLN: INTRAVENOUS | Status: AC

## 2011-02-27 MED ORDER — HYDROCORTISONE 1 % EX CREA
1.0000 "application " | TOPICAL_CREAM | Freq: Three times a day (TID) | CUTANEOUS | Status: DC | PRN
  Administered 2011-02-27: 1 via TOPICAL
  Filled 2011-02-27: qty 28

## 2011-02-27 MED ORDER — HYDRALAZINE HCL 20 MG/ML IJ SOLN
10.0000 mg | Freq: Once | INTRAMUSCULAR | Status: AC
  Administered 2011-02-27: 10 mg via INTRAVENOUS
  Filled 2011-02-27 (×2): qty 0.5

## 2011-02-27 MED ORDER — PROPOFOL 10 MG/ML IV BOLUS
INTRAVENOUS | Status: DC | PRN
  Administered 2011-02-27: 60 mg via INTRAVENOUS

## 2011-02-27 NOTE — Procedures (Signed)
Electrical Cardioversion Procedure Note Kary Sugrue [id number] 03/23/1934  Procedure: Electrical Cardioversion Indications:  Atrial Fibrillation.  The patient has been on Pradaxa for > 1 month without missing a dose.   Procedure Details Consent: Risks of procedure as well as the alternatives and risks of each were explained to the (patient/caregiver).  Consent for procedure obtained. Time Out: Verified patient identification, verified procedure, site/side was marked, verified correct patient position, special equipment/implants available, medications/allergies/relevent history reviewed, required imaging and test results available.  Performed  Patient placed on cardiac monitor, pulse oximetry, supplemental oxygen as necessary.  Sedation given: Propofol IV Pacer pads placed anterior and posterior chest.  Cardioverted 2 time(s).  Cardioverted at 150 J initially with conversion to atrial flutter.  Cardioverted at 200 J with conversion to NSR.  Evaluation Findings: Post procedure EKG shows: NSR Complications: None Patient did tolerate procedure well.   Marca Ancona 02/27/2011, 1:10 PM

## 2011-02-27 NOTE — Anesthesia Preprocedure Evaluation (Addendum)
Anesthesia Evaluation  Patient identified by MRN, date of birth, ID band Patient awake    Reviewed: Allergy & Precautions, H&P , NPO status , Patient's Chart, lab work & pertinent test results  Airway       Dental   Pulmonary          Cardiovascular hypertension, Pt. on home beta blockers + dysrhythmias Atrial Fibrillation     Neuro/Psych    GI/Hepatic   Endo/Other  Diabetes mellitus-, Type 2, Oral Hypoglycemic AgentsHyperthyroidism (medicated)   Renal/GU      Musculoskeletal   Abdominal   Peds  Hematology   Anesthesia Other Findings   Reproductive/Obstetrics                           Anesthesia Physical Anesthesia Plan  ASA: III  Anesthesia Plan: General   Post-op Pain Management:    Induction: Intravenous  Airway Management Planned: Mask  Additional Equipment:   Intra-op Plan:   Post-operative Plan:   Informed Consent: I have reviewed the patients History and Physical, chart, labs and discussed the procedure including the risks, benefits and alternatives for the proposed anesthesia with the patient or authorized representative who has indicated his/her understanding and acceptance.     Plan Discussed with: CRNA  Anesthesia Plan Comments:         Anesthesia Quick Evaluation

## 2011-02-27 NOTE — H&P (View-Only) (Signed)
 History of Present Illness: 76 yo WF with history of DM, hypothyroidism, HTN, HLD, Vitamin B12 deficiency, iron deficiency here today for cardiac followup. I saw her two weeks ago as a new patient. She presented for her stress test as ordered by Dr. Tisovec and was found to be in atrial fibrillation with HR of 115. She told me that she felt well. NO awareness of palpitations. She has noticed frequent episodes of diaphoresis. This is associated with weakness. She has no chest pains or SOB. She has been coughing for several months. No fever or chills. She has no personal history of CAD or heart disease but she does have a strong family history of CAD. She has 7 aunts and uncles who died suddenly from heart issues. She has 2 brothers who have had CAD/MI. No stress induced ischemia on nuclear study. Low risk study. Normal LVEF. I started Toprol XL and Pradaxa. Echo on 01/12/11 with normal LV size and function, LVEF of 55-60%, mild LVH, moderate TR, mild MR.   She is here today for follow up.  She has been taking Pradaxa for one month. No bleeding issues. She has no awareness of irregularity of her heart rhythm. She has no chest pain or SOB.    Past Medical History  Diagnosis Date  . Hyperthyroidism   . Hyperlipidemia   . Hypertension   . H/O: hysterectomy   . Ketoacidosis, diabetic, no coma, non-insulin dependent   . Vitamin B12 deficiency   . Glaucoma 2012    [place]  . History of colonoscopy 01/25/2010  . History of mammogram 08/04/2009  . Diabetes mellitus   . Atrial fibrillation     Past Surgical History  Procedure Date  . Bilateral foot surgery   . Abdominal hysterectomy   . Tubal ligation   . Rotator cuff repair   . Laparoscopic cholecystectomy     Current Outpatient Prescriptions  Medication Sig Dispense Refill  . cyanocobalamin 100 MCG tablet Take 100 mcg by mouth daily.        . ferrous sulfate 325 (65 FE) MG tablet Take 325 mg by mouth daily with breakfast.        .  levothyroxine (SYNTHROID, LEVOTHROID) 125 MCG tablet Take 125 mcg by mouth daily.        . linagliptin (TRADJENTA) 5 MG TABS tablet Take 5 mg by mouth daily.        . lisinopril (PRINIVIL,ZESTRIL) 40 MG tablet Take 40 mg by mouth daily.        . metFORMIN (GLUCOPHAGE) 850 MG tablet Take 850 mg by mouth 2 (two) times daily with a meal.        . metoprolol succinate (TOPROL XL) 25 MG 24 hr tablet Take 1 tablet (25 mg total) by mouth daily.  30 tablet  6  . Multiple Vitamins-Minerals (MULTIVITAMIN WITH MINERALS) tablet Take 1 tablet by mouth daily.        . rosuvastatin (CRESTOR) 10 MG tablet Take 5 mg by mouth daily.        . solifenacin (VESICARE) 5 MG tablet Take 10 mg by mouth daily.        . dabigatran (PRADAXA) 150 MG CAPS Take 1 capsule (150 mg total) by mouth every 12 (twelve) hours.  60 capsule  6    Allergies  Allergen Reactions  . Penicillins     History   Social History  . Marital Status: Married    Spouse Name: N/A    Number of Children:   2  . Years of Education: N/A   Occupational History  .  Other    Worked at [employer]   Social History Main Topics  . Smoking status: Never Smoker   . Smokeless tobacco: Not on file  . Alcohol Use: No  . Drug Use: No  . Sexually Active: Not on file   Other Topics Concern  . Not on file   Social History Narrative   Patient since 1995Husband with prostate cancer10-siblings-no cancer    Family History  Problem Relation Age of Onset  . Heart failure Father 83    enlarged heart  . Pneumonia Mother 76  . Diabetes Mother 76  . Cancer Neg Hx     Review of Systems:  As stated in the HPI and otherwise negative.   BP 140/83  Pulse 61  Ht 5' 4" (1.626 m)  Wt 233 lb (105.688 kg)  BMI 39.99 kg/m2  Physical Examination: General: Well developed, well nourished, NAD HEENT: OP clear, mucus membranes moist SKIN: warm, dry. No rashes. Neuro: No focal deficits Musculoskeletal: Muscle strength 5/5 all ext Psychiatric: Mood and affect  normal Neck: No JVD, no carotid bruits, no thyromegaly, no lymphadenopathy. Lungs:Clear bilaterally, no wheezes, rhonci, crackles Cardiovascular: Irregular. No murmurs, gallops or rubs. Abdomen:Soft. Bowel sounds present. Non-tender.  Extremities: No lower extremity edema. Pulses are 2 + in the bilateral DP/PT.  EKG: Atrial fibrillation, rate 73 bpm. Non-specific T wave changes.   

## 2011-02-27 NOTE — Interval H&P Note (Signed)
History and Physical Interval Note:  02/27/2011 12:46 PM  Summer Hawkins  has presented today for surgery, with the diagnosis of AFIB  The various methods of treatment have been discussed with the patient and family. After consideration of risks, benefits and other options for treatment, the patient has consented to  Procedure(s): CARDIOVERSION as a surgical intervention .  The patients' history has been reviewed, patient examined, no change in status, stable for surgery.  I have reviewed the patients' chart and labs.  Questions were answered to the patient's satisfaction.     Isabelle Matt [employer]

## 2011-02-27 NOTE — Progress Notes (Signed)
At 1351 Dr. Shirlee Latch paged and notified of elevated BP 185/159. Verbal order received for 10 mg Hydralazine IV x 1. At 1430: 168/72 IV hydralazine given.

## 2011-02-27 NOTE — Transfer of Care (Signed)
Immediate Anesthesia Transfer of Care Note  Patient: Summer Hawkins  Procedure(s) Performed:  CARDIOVERSION  Patient Location: PACU and Short Stay  Anesthesia Type: General  Level of Consciousness: sedated  Airway & Oxygen Therapy: Patient Spontanous Breathing and Patient connected to nasal cannula oxygen  Post-op Assessment: Report given to PACU RN and Post -op Vital signs reviewed and stable  Post vital signs: Reviewed and stable  Complications: No apparent anesthesia complications

## 2011-02-27 NOTE — Preoperative (Signed)
Beta Blockers   Reason not to administer Beta Blockers:pt took Toprol last pm

## 2011-02-27 NOTE — Anesthesia Postprocedure Evaluation (Signed)
  Anesthesia Post-op Note  Patient: Summer Hawkins  Procedure(s) Performed:  CARDIOVERSION  Patient Location: PACU and Short Stay  Anesthesia Type: General  Level of Consciousness: awake  Airway and Oxygen Therapy: Patient Spontanous Breathing and Patient connected to nasal cannula oxygen  Post-op Pain: none  Post-op Assessment: Post-op Vital signs reviewed, Patient's Cardiovascular Status Stable and Respiratory Function Stable  Post-op Vital Signs: Reviewed and stable  Complications: No apparent anesthesia complications

## 2011-02-28 ENCOUNTER — Encounter (HOSPITAL_COMMUNITY): Payer: Self-pay | Admitting: Cardiology

## 2011-03-04 ENCOUNTER — Encounter (HOSPITAL_COMMUNITY): Payer: Self-pay | Admitting: *Deleted

## 2011-03-04 ENCOUNTER — Other Ambulatory Visit: Payer: Self-pay

## 2011-03-04 ENCOUNTER — Emergency Department (HOSPITAL_COMMUNITY): Payer: Medicare Other

## 2011-03-04 ENCOUNTER — Emergency Department (HOSPITAL_COMMUNITY)
Admission: EM | Admit: 2011-03-04 | Discharge: 2011-03-05 | Disposition: A | Discharge status: Home / Self Care | Payer: Medicare Other | Attending: Emergency Medicine | Admitting: Emergency Medicine

## 2011-03-04 DIAGNOSIS — R0789 Other chest pain: Secondary | ICD-10-CM | POA: Insufficient documentation

## 2011-03-04 DIAGNOSIS — E876 Hypokalemia: Secondary | ICD-10-CM

## 2011-03-04 DIAGNOSIS — K219 Gastro-esophageal reflux disease without esophagitis: Secondary | ICD-10-CM

## 2011-03-04 LAB — POCT I-STAT, CHEM 8
BUN: 12 mg/dL (ref 6–23)
Calcium, Ion: <0.25 mmol/L — CL (ref 1.12–1.32)
Chloride: 101 mEq/L (ref 96–112)
HCT: 41 % (ref 36.0–46.0)
Potassium: 2.8 mEq/L — ABNORMAL LOW (ref 3.5–5.1)
Sodium: 142 mEq/L (ref 135–145)

## 2011-03-04 LAB — TROPONIN I: Troponin I: <0.3 ng/mL (ref <0.30)

## 2011-03-04 NOTE — ED Notes (Signed)
Patient states was shocked twice on Monday for afib. States since has had intermittent chest pain on the right side today lasted for a couple hours

## 2011-03-04 NOTE — ED Notes (Signed)
Chest pain on her midsternal area radiating to her right side of her chest.  Patient had cardiac conversion on Monday for patient has A-Fib.  Patient denies any other symptoms.  Patient's midsternal pain has been intermittent for 5 days

## 2011-03-05 MED ORDER — POTASSIUM CHLORIDE CRYS ER 20 MEQ PO TBCR
40.0000 meq | EXTENDED_RELEASE_TABLET | Freq: Once | ORAL | Status: AC
  Administered 2011-03-05: 40 meq via ORAL
  Filled 2011-03-05: qty 2

## 2011-03-05 NOTE — ED Provider Notes (Signed)
Medical screening examination/treatment/procedure(s) were performed by non-physician practitioner and as supervising physician I was immediately available for consultation/collaboration.  Izak Anding L Latron Ribas, MD 03/05/11 1301 

## 2011-03-05 NOTE — ED Provider Notes (Signed)
History     CSN: [id number]  Arrival date & time 03/04/11  1752   First MD Initiated Contact with Patient 03/04/11 2214      Chief Complaint  Patient presents with  . Chest Pain    (Consider location/radiation/quality/duration/timing/severity/associated sxs/prior treatment) HPI Comments: Patient here with a several day history of episodic epigastric to substernal chest pain with radiation to the right anterior chest - states the pain is burning in nature - last episode was about 4 hours ago which lasted the entire 2 hours while at the movies.  States upon arrival here the pain spontaneously resolved - denies pain at this time - reports that the pain was not associated with shortness of breath, nausea, vomiting, diarrhea, abdominal pain - states also not associated with food intake as well.  Reports recent history of cardioversion for afib with Dr. Shirlee Latch on Monday  Patient is a 76 y.o. female presenting with chest pain. The history is provided by the patient and the spouse. No language interpreter was used.  Chest Pain The chest pain began 3 - 5 days ago. Duration of episode(s) is 2 hours. Chest pain occurs intermittently. The chest pain is resolved. At its most intense, the pain is at 6/10. The pain is currently at 0/10. The severity of the pain is moderate. The quality of the pain is described as burning. The pain does not radiate. Pertinent negatives for primary symptoms include no fever, no fatigue, no syncope, no shortness of breath, no cough, no wheezing, no palpitations, no abdominal pain, no nausea, no vomiting, no dizziness and no altered mental status.  Pertinent negatives for associated symptoms include no diaphoresis, no lower extremity edema, no near-syncope, no numbness, no orthopnea, no paroxysmal nocturnal dyspnea and no weakness. She tried nothing for the symptoms.  Her past medical history is significant for CAD.     Past Medical History  Diagnosis Date  .  Hyperthyroidism   . Hyperlipidemia   . Hypertension   . H/O: hysterectomy   . Ketoacidosis, diabetic, no coma, non-insulin dependent   . Vitamin B12 deficiency   . Glaucoma 2012    [place]  . History of colonoscopy 01/25/2010  . History of mammogram 08/04/2009  . Diabetes mellitus   . Atrial fibrillation     Past Surgical History  Procedure Date  . Bilateral foot surgery   . Abdominal hysterectomy   . Tubal ligation   . Rotator cuff repair   . Laparoscopic cholecystectomy   . Cardioversion 02/27/2011    Procedure: CARDIOVERSION;  Surgeon: Marca Ancona, MD;  Location: [facility];  Service: Cardiovascular;  Laterality: N/A;    Family History  Problem Relation Age of Onset  . Heart failure Father 83    enlarged heart  . Pneumonia Mother 28  . Diabetes Mother 54  . Cancer Neg Hx     History  Substance Use Topics  . Smoking status: Never Smoker   . Smokeless tobacco: Not on file  . Alcohol Use: No    OB History    Grav Para Term Preterm Abortions TAB SAB Ect Mult Living                  Review of Systems  Constitutional: Negative for fever, diaphoresis and fatigue.  Respiratory: Negative for cough, shortness of breath and wheezing.   Cardiovascular: Positive for chest pain. Negative for palpitations, orthopnea, syncope and near-syncope.  Gastrointestinal: Negative for nausea, vomiting and abdominal pain.  Neurological: Negative for dizziness, weakness  and numbness.  Psychiatric/Behavioral: Negative for altered mental status.  All other systems reviewed and are negative.    Allergies  Penicillins  Home Medications   Current Outpatient Rx  Name Route Sig Dispense Refill  . CYANOCOBALAMIN 100 MCG PO TABS Oral Take 100 mcg by mouth daily.      Marland Kitchen DABIGATRAN ETEXILATE MESYLATE 150 MG PO CAPS Oral Take 1 capsule (150 mg total) by mouth every 12 (twelve) hours. 60 capsule 6  . FERROUS SULFATE 325 (65 FE) MG PO TABS Oral Take 325 mg by mouth daily with breakfast.       . LEVOTHYROXINE SODIUM 125 MCG PO TABS Oral Take 125 mcg by mouth daily.      Marland Kitchen LINAGLIPTIN 5 MG PO TABS Oral Take 5 mg by mouth daily.      Marland Kitchen LISINOPRIL 40 MG PO TABS Oral Take 40 mg by mouth daily.      Marland Kitchen METFORMIN HCL 850 MG PO TABS Oral Take 850 mg by mouth 2 (two) times daily with a meal.      . METOPROLOL SUCCINATE ER 25 MG PO TB24 Oral Take 1 tablet (25 mg total) by mouth daily. 30 tablet 6  . MULTI-VITAMIN/MINERALS PO TABS Oral Take 1 tablet by mouth daily.      Marland Kitchen ROSUVASTATIN CALCIUM 10 MG PO TABS Oral Take 5 mg by mouth daily.      Marland Kitchen SOLIFENACIN SUCCINATE 5 MG PO TABS Oral Take 5 mg by mouth daily.       BP 178/82  Pulse 72  Temp(Src) 98.5 F (36.9 C) (Oral)  Resp 20  SpO2 95%  Physical Exam  Nursing note and vitals reviewed. Constitutional: She is oriented to person, place, and time. She appears well-developed and well-nourished. No distress.  HENT:  Head: Normocephalic and atraumatic.  Right Ear: External ear normal.  Left Ear: External ear normal.  Mouth/Throat: Oropharynx is clear and moist. No oropharyngeal exudate.  Eyes: Conjunctivae are normal. Pupils are equal, round, and reactive to light. No scleral icterus.  Neck: Normal range of motion. Neck supple. No JVD present. No tracheal deviation present.  Cardiovascular: Normal rate, regular rhythm, normal heart sounds and intact distal pulses.  Exam reveals no gallop and no friction rub.   No murmur heard. Pulmonary/Chest: Effort normal and breath sounds normal. No respiratory distress. She exhibits no tenderness.  Abdominal: Soft. Bowel sounds are normal. She exhibits no distension. There is no tenderness.  Musculoskeletal: Normal range of motion.  Neurological: She is alert and oriented to person, place, and time. No cranial nerve deficit.  Skin: Skin is warm and dry. No rash noted. No erythema. No pallor.  Psychiatric: She has a normal mood and affect. Her behavior is normal. Judgment and thought content  normal.    ED Course  Procedures (including critical care time)  Labs Reviewed  POCT I-STAT, CHEM 8 - Abnormal; Notable for the following:    Potassium 2.8 (*)    Glucose, Bld 150 (*)    Calcium, Ion <0.25 (*)    All other components within normal limits  TROPONIN I  I-STAT, CHEM 8   Dg Chest 2 View  03/04/2011  *RADIOLOGY REPORT*  Clinical Data: Chest pain  CHEST - 2 VIEW  Comparison: 06/12/2003  Findings: The lungs are clear without focal infiltrate, edema, pneumothorax or pleural effusion.  Linear atelectasis or scarring is seen at the left lung base. The cardiopericardial silhouette is enlarged. Imaged bony structures of the thorax are intact.  IMPRESSION: No acute findings.  Original Report Authenticated By: ERIC A. MANSELL, M.D.    Date: 03/05/2011  Rate: 71  Rhythm: normal sinus rhythm  QRS Axis: normal  Intervals: QT prolonged  ST/T Wave abnormalities: normal  Conduction Disutrbances:none  Narrative Interpretation: Reviewed by Dr. Effie Shy  Old EKG Reviewed: unchanged   Hypokalemia Atypical chest pain GERD    MDM  I have spoken with Dr. Maryelizabeth Kaufmann with [facility] cardiology about this patient and with the exception of the hypokalemia, all other lab and x-ray values are normal - given the nature of this pain with the burning sensation, I feel that this is likely GERD - she would like to try tums at home first - Dr. Maryelizabeth Kaufmann believes that the K is also likely a one time lab abnormality - we will give the patient a single dose of of K here and then have her follow up with Dr. Shirlee Latch on Monday.        Izola Price [place], [place] 03/05/11 [phone]

## 2011-03-20 DIAGNOSIS — Z7901 Long term (current) use of anticoagulants: Secondary | ICD-10-CM | POA: Diagnosis not present

## 2011-03-20 DIAGNOSIS — E1129 Type 2 diabetes mellitus with other diabetic kidney complication: Secondary | ICD-10-CM | POA: Diagnosis not present

## 2011-03-20 DIAGNOSIS — I10 Essential (primary) hypertension: Secondary | ICD-10-CM | POA: Diagnosis not present

## 2011-03-20 DIAGNOSIS — I4891 Unspecified atrial fibrillation: Secondary | ICD-10-CM | POA: Diagnosis not present

## 2011-03-20 DIAGNOSIS — E039 Hypothyroidism, unspecified: Secondary | ICD-10-CM | POA: Diagnosis not present

## 2011-03-22 DIAGNOSIS — Z7901 Long term (current) use of anticoagulants: Secondary | ICD-10-CM | POA: Diagnosis not present

## 2011-03-22 DIAGNOSIS — I4891 Unspecified atrial fibrillation: Secondary | ICD-10-CM | POA: Diagnosis not present

## 2011-03-29 DIAGNOSIS — Z7901 Long term (current) use of anticoagulants: Secondary | ICD-10-CM | POA: Diagnosis not present

## 2011-03-29 DIAGNOSIS — I4891 Unspecified atrial fibrillation: Secondary | ICD-10-CM | POA: Diagnosis not present

## 2011-04-04 ENCOUNTER — Encounter: Payer: Self-pay | Admitting: Cardiovascular Disease

## 2011-04-04 ENCOUNTER — Ambulatory Visit (INDEPENDENT_AMBULATORY_CARE_PROVIDER_SITE_OTHER): Payer: Medicare Other | Admitting: Cardiovascular Disease

## 2011-04-04 VITALS — BP 132/70 | HR 90 | Ht 64.0 in | Wt 227.8 lb

## 2011-04-04 DIAGNOSIS — I4891 Unspecified atrial fibrillation: Secondary | ICD-10-CM

## 2011-04-04 MED ORDER — METOPROLOL SUCCINATE ER 25 MG PO TB24: 25.0000 mg | ORAL_TABLET | Freq: Every day | ORAL | Status: DC

## 2011-04-04 NOTE — Assessment & Plan Note (Addendum)
Maintaining sinus rhythm. Coumadin followed in Dr. Wetzel Bjornstad office. We would be glad to follow her in our coumadin clinic if needed in the future. Will restart Toprol XL 25 mg po Qdaily.

## 2011-04-04 NOTE — Patient Instructions (Signed)
Your physician wants you to follow-up in: 6 months.  You will receive a reminder letter in the mail two months in advance. If you don't receive a letter, please call our office to schedule the follow-up appointment.  Your physician has recommended you make the following change in your medication: Start Toprol XL 25 mg by mouth daily

## 2011-04-04 NOTE — Progress Notes (Signed)
History of Present Illness: 77 yo WF with history of DM, hypothyroidism, HTN, HLD, Vitamin B12 deficiency, iron deficiency here today for cardiac followup. I saw her in October 2012 as a new patient. She presented for her stress test as ordered by Dr. Wylene Simmer and was found to be in atrial fibrillation with HR of 115. She told me that she felt well. NO awareness of palpitations. She had no chest pains or SOB. She had been coughing for several months. No fever or chills. No stress induced ischemia on nuclear study. Low risk study. Normal LVEF. I started Toprol XL and Pradaxa. Echo on 01/12/11 with normal LV size and function, LVEF of 55-60%, mild LVH, moderate TR, mild MR. I started her on Pradaxa for anticoagulation. She was cardioverted on 02/27/11 and has maintained NSR since then. She started having heartburn and was seen in [facility] ED first week of January 2013. Chest pain felt to be GERD related. She stopped her Pradaxa and started coumadin per Dr. Darral Dash. Her coumadin is being followed in his office. She has had no bleeding issues. Her GERD has resolved. She has been doing well otherwise. No chest pain or SOB.    Past Medical History  Diagnosis Date  . Hyperthyroidism   . Hyperlipidemia   . Hypertension   . H/O: hysterectomy   . Ketoacidosis, diabetic, no coma, non-insulin dependent   . Vitamin B12 deficiency   . Glaucoma 2012    [place]  . History of colonoscopy 01/25/2010  . History of mammogram 08/04/2009  . Diabetes mellitus   . Atrial fibrillation     Past Surgical History  Procedure Date  . Bilateral foot surgery   . Abdominal hysterectomy   . Tubal ligation   . Rotator cuff repair   . Laparoscopic cholecystectomy   . Cardioversion 02/27/2011    Procedure: CARDIOVERSION;  Surgeon: Marca Ancona, MD;  Location: [facility] OR;  Service: Cardiovascular;  Laterality: N/A;    Current Outpatient Prescriptions  Medication Sig Dispense Refill  . cyanocobalamin 100 MCG tablet Take 100  mcg by mouth daily.        . ferrous sulfate 325 (65 FE) MG tablet Take 325 mg by mouth daily with breakfast.        . levothyroxine (SYNTHROID, LEVOTHROID) 125 MCG tablet Take 125 mcg by mouth daily.        Marland Kitchen linagliptin (TRADJENTA) 5 MG TABS tablet Take 5 mg by mouth daily.        Marland Kitchen lisinopril (PRINIVIL,ZESTRIL) 40 MG tablet Take 40 mg by mouth daily.        . metFORMIN (GLUCOPHAGE) 850 MG tablet Take 850 mg by mouth 2 (two) times daily with a meal.        . Multiple Vitamins-Minerals (MULTIVITAMIN WITH MINERALS) tablet Take 1 tablet by mouth daily.        . rosuvastatin (CRESTOR) 10 MG tablet Take 5 mg by mouth daily.        . solifenacin (VESICARE) 5 MG tablet Take 5 mg by mouth daily.       . WARFARIN SODIUM PO Take by mouth as directed.        Allergies  Allergen Reactions  . Penicillins     Patient stated that throat closes up, and she breaks out in hives.     History   Social History  . Marital Status: Married    Spouse Name: N/A    Number of Children: 2  . Years of Education: N/A  Occupational History  .  Other    Worked at [employer] History Main Topics  . Smoking status: Never Smoker   . Smokeless tobacco: Not on file  . Alcohol Use: No  . Drug Use: No  . Sexually Active: Not on file   Other Topics Concern  . Not on file   Social History Narrative   Patient since [phone] with prostate cancer10-siblings-no cancer    Family History  Problem Relation Age of Onset  . Heart failure Father 83    enlarged heart  . Pneumonia Mother 60  . Diabetes Mother 55  . Cancer Neg Hx     Review of Systems:  As stated in the HPI and otherwise negative.   BP 132/70  Pulse 90  Ht 5\' 4"  (1.626 m)  Wt 227 lb 12.8 oz (103.329 kg)  BMI 39.10 kg/m2  Physical Examination: General: Well developed, well nourished, NAD HEENT: OP clear, mucus membranes moist SKIN: warm, dry. No rashes. Neuro: No focal deficits Musculoskeletal: Muscle strength 5/5 all  ext Psychiatric: Mood and affect normal Neck: No JVD, no carotid bruits, no thyromegaly, no lymphadenopathy. Lungs:Clear bilaterally, no wheezes, rhonci, crackles Cardiovascular: Regular rate and rhythm. No murmurs, gallops or rubs. Abdomen:Soft. Bowel sounds present. Non-tender.  Extremities: No lower extremity edema. Pulses are 2 + in the bilateral DP/PT.  EKG: NSR, rate 90 bpm. Poor R wave progression precordial leads.

## 2011-04-12 DIAGNOSIS — I4891 Unspecified atrial fibrillation: Secondary | ICD-10-CM | POA: Diagnosis not present

## 2011-04-12 DIAGNOSIS — Z7901 Long term (current) use of anticoagulants: Secondary | ICD-10-CM | POA: Diagnosis not present

## 2011-04-18 DIAGNOSIS — I4891 Unspecified atrial fibrillation: Secondary | ICD-10-CM | POA: Diagnosis not present

## 2011-04-18 DIAGNOSIS — Z7901 Long term (current) use of anticoagulants: Secondary | ICD-10-CM | POA: Diagnosis not present

## 2011-04-26 DIAGNOSIS — I4891 Unspecified atrial fibrillation: Secondary | ICD-10-CM | POA: Diagnosis not present

## 2011-04-26 DIAGNOSIS — Z7901 Long term (current) use of anticoagulants: Secondary | ICD-10-CM | POA: Diagnosis not present

## 2011-05-03 ENCOUNTER — Telehealth: Payer: Self-pay | Admitting: Cardiovascular Disease

## 2011-05-03 NOTE — Telephone Encounter (Signed)
New Msg:  Pt wanted to see if new refill for coumadin has already been sent to [facility].  please return pt call to discuss further.  If not then pt needs new refill of coumadin sent to [facility].

## 2011-05-03 NOTE — Telephone Encounter (Signed)
Spoke with pt and told her Coumadin was monitored by Dr. Deneen Harts office and she should contact them for refills for this.  She then stated it was metoprolol she needed refills for.  This was sent at office visit on April 04, 2011. She will call us back if refill for metoprolol needed.

## 2011-05-22 DIAGNOSIS — J209 Acute bronchitis, unspecified: Secondary | ICD-10-CM | POA: Diagnosis not present

## 2011-05-24 DIAGNOSIS — Z7901 Long term (current) use of anticoagulants: Secondary | ICD-10-CM | POA: Diagnosis not present

## 2011-05-24 DIAGNOSIS — I4891 Unspecified atrial fibrillation: Secondary | ICD-10-CM | POA: Diagnosis not present

## 2011-06-07 DIAGNOSIS — Z7901 Long term (current) use of anticoagulants: Secondary | ICD-10-CM | POA: Diagnosis not present

## 2011-06-07 DIAGNOSIS — I4891 Unspecified atrial fibrillation: Secondary | ICD-10-CM | POA: Diagnosis not present

## 2011-06-19 DIAGNOSIS — E1129 Type 2 diabetes mellitus with other diabetic kidney complication: Secondary | ICD-10-CM | POA: Diagnosis not present

## 2011-06-19 DIAGNOSIS — E785 Hyperlipidemia, unspecified: Secondary | ICD-10-CM | POA: Diagnosis not present

## 2011-06-19 DIAGNOSIS — I4891 Unspecified atrial fibrillation: Secondary | ICD-10-CM | POA: Diagnosis not present

## 2011-06-19 DIAGNOSIS — E039 Hypothyroidism, unspecified: Secondary | ICD-10-CM | POA: Diagnosis not present

## 2011-06-28 DIAGNOSIS — Z7901 Long term (current) use of anticoagulants: Secondary | ICD-10-CM | POA: Diagnosis not present

## 2011-06-28 DIAGNOSIS — I4891 Unspecified atrial fibrillation: Secondary | ICD-10-CM | POA: Diagnosis not present

## 2011-07-27 DIAGNOSIS — I4891 Unspecified atrial fibrillation: Secondary | ICD-10-CM | POA: Diagnosis not present

## 2011-07-27 DIAGNOSIS — Z7901 Long term (current) use of anticoagulants: Secondary | ICD-10-CM | POA: Diagnosis not present

## 2011-08-31 DIAGNOSIS — Z7901 Long term (current) use of anticoagulants: Secondary | ICD-10-CM | POA: Diagnosis not present

## 2011-08-31 DIAGNOSIS — I4891 Unspecified atrial fibrillation: Secondary | ICD-10-CM | POA: Diagnosis not present

## 2011-09-18 ENCOUNTER — Other Ambulatory Visit: Payer: Self-pay | Admitting: Internal Medicine

## 2011-09-18 DIAGNOSIS — Z1231 Encounter for screening mammogram for malignant neoplasm of breast: Secondary | ICD-10-CM

## 2011-09-25 DIAGNOSIS — Z7901 Long term (current) use of anticoagulants: Secondary | ICD-10-CM | POA: Diagnosis not present

## 2011-09-25 DIAGNOSIS — E1129 Type 2 diabetes mellitus with other diabetic kidney complication: Secondary | ICD-10-CM | POA: Diagnosis not present

## 2011-09-25 DIAGNOSIS — I10 Essential (primary) hypertension: Secondary | ICD-10-CM | POA: Diagnosis not present

## 2011-09-25 DIAGNOSIS — E785 Hyperlipidemia, unspecified: Secondary | ICD-10-CM | POA: Diagnosis not present

## 2011-09-25 DIAGNOSIS — E039 Hypothyroidism, unspecified: Secondary | ICD-10-CM | POA: Diagnosis not present

## 2011-10-03 ENCOUNTER — Encounter: Payer: Self-pay | Admitting: Cardiovascular Disease

## 2011-10-03 ENCOUNTER — Ambulatory Visit (INDEPENDENT_AMBULATORY_CARE_PROVIDER_SITE_OTHER): Payer: Medicare Other | Admitting: Cardiovascular Disease

## 2011-10-03 VITALS — BP 130/80 | HR 66 | Ht 64.0 in | Wt 234.0 lb

## 2011-10-03 DIAGNOSIS — I4891 Unspecified atrial fibrillation: Secondary | ICD-10-CM | POA: Diagnosis not present

## 2011-10-03 NOTE — Patient Instructions (Addendum)
Your physician wants you to follow-up in: 1 year. You will receive a reminder letter in the mail two months in advance. If you don't receive a letter, please call our office to schedule the follow-up appointment.  

## 2011-10-03 NOTE — Progress Notes (Signed)
History of Present Illness: 77 yo WF with history of DM, hypothyroidism, HTN, HLD, Vitamin B12 deficiency, iron deficiency here today for cardiac followup. I saw her in October 2012 as a new patient. She presented for her stress test as ordered by Dr. Wylene Simmer and was found to be in atrial fibrillation with HR of 115. She told me that she felt well. NO awareness of palpitations. She had no chest pains or SOB. She had been coughing for several months. No fever or chills. No stress induced ischemia on nuclear study. Low risk study. Normal LVEF. I started Toprol XL and Pradaxa. Echo on 01/12/11 with normal LV size and function, LVEF of 55-60%, mild LVH, moderate TR, mild MR. She was cardioverted on 02/27/11 and has maintained NSR since then. She started having heartburn and was seen in [facility] ED first week of January 2013. Chest pain felt to be GERD related. She stopped her Pradaxa and started coumadin per Dr. Darral Dash. Her coumadin is being followed in his office.   She is here today for follow up. She is doing well. She has had no bleeding issues. Her GERD has resolved.  No chest pain or SOB.   Primary Care Physician: Guerry Bruin   Past Medical History  Diagnosis Date  . Hyperthyroidism   . Hyperlipidemia   . Hypertension   . H/O: hysterectomy   . Ketoacidosis, diabetic, no coma, non-insulin dependent   . Vitamin B12 deficiency   . Glaucoma 2012    [place]  . History of colonoscopy 01/25/2010  . History of mammogram 08/04/2009  . Diabetes mellitus   . Atrial fibrillation     Past Surgical History  Procedure Date  . Bilateral foot surgery   . Abdominal hysterectomy   . Tubal ligation   . Rotator cuff repair   . Laparoscopic cholecystectomy   . Cardioversion 02/27/2011    Procedure: CARDIOVERSION;  Surgeon: Marca Ancona, MD;  Location: [facility] OR;  Service: Cardiovascular;  Laterality: N/A;    Current Outpatient Prescriptions  Medication Sig Dispense Refill  . cyanocobalamin 100  MCG tablet Take 100 mcg by mouth daily.        . ferrous sulfate 325 (65 FE) MG tablet Take 325 mg by mouth daily with breakfast.        . levothyroxine (SYNTHROID, LEVOTHROID) 125 MCG tablet Take 125 mcg by mouth daily.        Marland Kitchen linagliptin (TRADJENTA) 5 MG TABS tablet Take 5 mg by mouth daily.        Marland Kitchen lisinopril (PRINIVIL,ZESTRIL) 40 MG tablet Take 40 mg by mouth daily.        . metFORMIN (GLUCOPHAGE) 850 MG tablet Take 850 mg by mouth 2 (two) times daily with a meal.        . metoprolol succinate (TOPROL XL) 25 MG 24 hr tablet Take 1 tablet (25 mg total) by mouth daily.  90 tablet  3  . Multiple Vitamins-Minerals (MULTIVITAMIN WITH MINERALS) tablet Take 1 tablet by mouth daily.        . rosuvastatin (CRESTOR) 10 MG tablet Take 5 mg by mouth daily.        . solifenacin (VESICARE) 5 MG tablet Take 5 mg by mouth daily.       . WARFARIN SODIUM PO Take by mouth as directed.        Allergies  Allergen Reactions  . Penicillins     Patient stated that throat closes up, and she breaks out in hives.  History   Social History  . Marital Status: Married    Spouse Name: N/A    Number of Children: 2  . Years of Education: N/A   Occupational History  .  Other    Worked at [employer] History Main Topics  . Smoking status: Never Smoker   . Smokeless tobacco: Not on file  . Alcohol Use: No  . Drug Use: No  . Sexually Active: Not on file   Other Topics Concern  . Not on file   Social History Narrative   Patient since [phone] with prostate cancer10-siblings-no cancer    Family History  Problem Relation Age of Onset  . Heart failure Father 83    enlarged heart  . Pneumonia Mother 27  . Diabetes Mother 95  . Cancer Neg Hx     Review of Systems:  As stated in the HPI and otherwise negative.   BP 179/89  Pulse 66  Ht 5\' 4"  (1.626 m)  Wt 234 lb (106.142 kg)  BMI 40.17 kg/m2  Physical Examination: General: Well developed, well nourished, NAD HEENT: OP clear,  mucus membranes moist SKIN: warm, dry. No rashes. Neuro: No focal deficits Musculoskeletal: Muscle strength 5/5 all ext Psychiatric: Mood and affect normal Neck: No JVD, no carotid bruits, no thyromegaly, no lymphadenopathy. Lungs:Clear bilaterally, no wheezes, rhonci, crackles Cardiovascular: Regular rate and rhythm. No murmurs, gallops or rubs. Abdomen:Soft. Bowel sounds present. Non-tender.  Extremities: No lower extremity edema. Pulses are 2 + in the bilateral DP/PT.

## 2011-10-03 NOTE — Assessment & Plan Note (Addendum)
She is maintaining NSR. She feels well. Will continue coumadin (followed by Dr. Wylene Simmer). Will continue Toprol. No changes today.

## 2011-10-16 DIAGNOSIS — I4891 Unspecified atrial fibrillation: Secondary | ICD-10-CM | POA: Diagnosis not present

## 2011-10-16 DIAGNOSIS — Z7901 Long term (current) use of anticoagulants: Secondary | ICD-10-CM | POA: Diagnosis not present

## 2011-10-20 ENCOUNTER — Ambulatory Visit
Admission: RE | Admit: 2011-10-20 | Discharge: 2011-10-20 | Disposition: A | Discharge status: Home / Self Care | Payer: Medicare Other | Source: Ambulatory Visit | Attending: Internal Medicine | Admitting: Internal Medicine

## 2011-10-20 DIAGNOSIS — Z1231 Encounter for screening mammogram for malignant neoplasm of breast: Secondary | ICD-10-CM

## 2011-11-14 DIAGNOSIS — Z7901 Long term (current) use of anticoagulants: Secondary | ICD-10-CM | POA: Diagnosis not present

## 2011-11-14 DIAGNOSIS — I4891 Unspecified atrial fibrillation: Secondary | ICD-10-CM | POA: Diagnosis not present

## 2011-12-14 DIAGNOSIS — I4891 Unspecified atrial fibrillation: Secondary | ICD-10-CM | POA: Diagnosis not present

## 2011-12-14 DIAGNOSIS — Z23 Encounter for immunization: Secondary | ICD-10-CM | POA: Diagnosis not present

## 2011-12-14 DIAGNOSIS — Z7901 Long term (current) use of anticoagulants: Secondary | ICD-10-CM | POA: Diagnosis not present

## 2012-01-08 DIAGNOSIS — E1139 Type 2 diabetes mellitus with other diabetic ophthalmic complication: Secondary | ICD-10-CM | POA: Diagnosis not present

## 2012-01-08 DIAGNOSIS — Z961 Presence of intraocular lens: Secondary | ICD-10-CM | POA: Diagnosis not present

## 2012-01-08 DIAGNOSIS — H52209 Unspecified astigmatism, unspecified eye: Secondary | ICD-10-CM | POA: Diagnosis not present

## 2012-01-17 DIAGNOSIS — Z7901 Long term (current) use of anticoagulants: Secondary | ICD-10-CM | POA: Diagnosis not present

## 2012-01-17 DIAGNOSIS — I4891 Unspecified atrial fibrillation: Secondary | ICD-10-CM | POA: Diagnosis not present

## 2012-02-22 DIAGNOSIS — I4891 Unspecified atrial fibrillation: Secondary | ICD-10-CM | POA: Diagnosis not present

## 2012-02-22 DIAGNOSIS — Z7901 Long term (current) use of anticoagulants: Secondary | ICD-10-CM | POA: Diagnosis not present

## 2012-03-02 DIAGNOSIS — N390 Urinary tract infection, site not specified: Secondary | ICD-10-CM | POA: Diagnosis not present

## 2012-03-27 DIAGNOSIS — E039 Hypothyroidism, unspecified: Secondary | ICD-10-CM | POA: Diagnosis not present

## 2012-03-27 DIAGNOSIS — I4891 Unspecified atrial fibrillation: Secondary | ICD-10-CM | POA: Diagnosis not present

## 2012-03-27 DIAGNOSIS — E538 Deficiency of other specified B group vitamins: Secondary | ICD-10-CM | POA: Diagnosis not present

## 2012-03-27 DIAGNOSIS — E785 Hyperlipidemia, unspecified: Secondary | ICD-10-CM | POA: Diagnosis not present

## 2012-03-27 DIAGNOSIS — E1129 Type 2 diabetes mellitus with other diabetic kidney complication: Secondary | ICD-10-CM | POA: Diagnosis not present

## 2012-03-27 DIAGNOSIS — R82998 Other abnormal findings in urine: Secondary | ICD-10-CM | POA: Diagnosis not present

## 2012-03-27 DIAGNOSIS — Z7901 Long term (current) use of anticoagulants: Secondary | ICD-10-CM | POA: Diagnosis not present

## 2012-04-04 DIAGNOSIS — J029 Acute pharyngitis, unspecified: Secondary | ICD-10-CM | POA: Diagnosis not present

## 2012-04-04 DIAGNOSIS — R509 Fever, unspecified: Secondary | ICD-10-CM | POA: Diagnosis not present

## 2012-04-04 DIAGNOSIS — R05 Cough: Secondary | ICD-10-CM | POA: Diagnosis not present

## 2012-04-04 DIAGNOSIS — R0602 Shortness of breath: Secondary | ICD-10-CM | POA: Diagnosis not present

## 2012-04-04 DIAGNOSIS — R059 Cough, unspecified: Secondary | ICD-10-CM | POA: Diagnosis not present

## 2012-04-18 DIAGNOSIS — I4891 Unspecified atrial fibrillation: Secondary | ICD-10-CM | POA: Diagnosis not present

## 2012-04-18 DIAGNOSIS — Z7901 Long term (current) use of anticoagulants: Secondary | ICD-10-CM | POA: Diagnosis not present

## 2012-05-02 DIAGNOSIS — I4891 Unspecified atrial fibrillation: Secondary | ICD-10-CM | POA: Diagnosis not present

## 2012-05-02 DIAGNOSIS — Z7901 Long term (current) use of anticoagulants: Secondary | ICD-10-CM | POA: Diagnosis not present

## 2012-05-30 DIAGNOSIS — R079 Chest pain, unspecified: Secondary | ICD-10-CM | POA: Diagnosis not present

## 2012-06-05 DIAGNOSIS — I4891 Unspecified atrial fibrillation: Secondary | ICD-10-CM | POA: Diagnosis not present

## 2012-06-05 DIAGNOSIS — Z7901 Long term (current) use of anticoagulants: Secondary | ICD-10-CM | POA: Diagnosis not present

## 2012-06-06 DIAGNOSIS — Z961 Presence of intraocular lens: Secondary | ICD-10-CM | POA: Diagnosis not present

## 2012-06-06 DIAGNOSIS — E119 Type 2 diabetes mellitus without complications: Secondary | ICD-10-CM | POA: Diagnosis not present

## 2012-06-06 DIAGNOSIS — H40019 Open angle with borderline findings, low risk, unspecified eye: Secondary | ICD-10-CM | POA: Diagnosis not present

## 2012-06-19 DIAGNOSIS — Z7901 Long term (current) use of anticoagulants: Secondary | ICD-10-CM | POA: Diagnosis not present

## 2012-06-19 DIAGNOSIS — I4891 Unspecified atrial fibrillation: Secondary | ICD-10-CM | POA: Diagnosis not present

## 2012-07-09 DIAGNOSIS — I4891 Unspecified atrial fibrillation: Secondary | ICD-10-CM | POA: Diagnosis not present

## 2012-07-09 DIAGNOSIS — Z7901 Long term (current) use of anticoagulants: Secondary | ICD-10-CM | POA: Diagnosis not present

## 2012-08-14 DIAGNOSIS — I4891 Unspecified atrial fibrillation: Secondary | ICD-10-CM | POA: Diagnosis not present

## 2012-08-14 DIAGNOSIS — Z7901 Long term (current) use of anticoagulants: Secondary | ICD-10-CM | POA: Diagnosis not present

## 2012-09-17 DIAGNOSIS — Z7901 Long term (current) use of anticoagulants: Secondary | ICD-10-CM | POA: Diagnosis not present

## 2012-09-17 DIAGNOSIS — I4891 Unspecified atrial fibrillation: Secondary | ICD-10-CM | POA: Diagnosis not present

## 2012-09-26 DIAGNOSIS — E538 Deficiency of other specified B group vitamins: Secondary | ICD-10-CM | POA: Diagnosis not present

## 2012-09-26 DIAGNOSIS — Z7901 Long term (current) use of anticoagulants: Secondary | ICD-10-CM | POA: Diagnosis not present

## 2012-09-26 DIAGNOSIS — I10 Essential (primary) hypertension: Secondary | ICD-10-CM | POA: Diagnosis not present

## 2012-09-26 DIAGNOSIS — E1129 Type 2 diabetes mellitus with other diabetic kidney complication: Secondary | ICD-10-CM | POA: Diagnosis not present

## 2012-09-26 DIAGNOSIS — E039 Hypothyroidism, unspecified: Secondary | ICD-10-CM | POA: Diagnosis not present

## 2012-09-26 DIAGNOSIS — E785 Hyperlipidemia, unspecified: Secondary | ICD-10-CM | POA: Diagnosis not present

## 2012-09-26 DIAGNOSIS — I4891 Unspecified atrial fibrillation: Secondary | ICD-10-CM | POA: Diagnosis not present

## 2012-09-26 DIAGNOSIS — Z23 Encounter for immunization: Secondary | ICD-10-CM | POA: Diagnosis not present

## 2012-10-08 ENCOUNTER — Ambulatory Visit: Payer: Medicare Other | Admitting: Cardiovascular Disease

## 2012-10-17 DIAGNOSIS — Z1212 Encounter for screening for malignant neoplasm of rectum: Secondary | ICD-10-CM | POA: Diagnosis not present

## 2012-10-21 ENCOUNTER — Ambulatory Visit (INDEPENDENT_AMBULATORY_CARE_PROVIDER_SITE_OTHER): Payer: Medicare Other | Admitting: Cardiovascular Disease

## 2012-10-21 ENCOUNTER — Encounter: Payer: Self-pay | Admitting: Cardiovascular Disease

## 2012-10-21 VITALS — BP 126/84 | HR 73 | Ht 63.5 in | Wt 232.0 lb

## 2012-10-21 DIAGNOSIS — I4891 Unspecified atrial fibrillation: Secondary | ICD-10-CM

## 2012-10-21 DIAGNOSIS — Z7901 Long term (current) use of anticoagulants: Secondary | ICD-10-CM | POA: Diagnosis not present

## 2012-10-21 MED ORDER — METOPROLOL SUCCINATE ER 25 MG PO TB24: 25.0000 mg | ORAL_TABLET | Freq: Every day | ORAL | Status: DC

## 2012-10-21 NOTE — Progress Notes (Signed)
History of Present Illness: 78 yo WF with history of DM, hypothyroidism, HTN, HLD, Vitamin B12 deficiency, iron deficiency here today for cardiac followup. I saw her in October 2012 as a new patient. She presented for her stress test as ordered by Dr. Wylene Simmer and was found to be in atrial fibrillation with HR of 115. She told me that she felt well. NO awareness of palpitations. She had no chest pains or SOB. She had been coughing for several months. No fever or chills. No stress induced ischemia on nuclear study. Low risk study. Normal LVEF. I started Toprol XL and Pradaxa. Echo on 01/12/11 with normal LV size and function, LVEF of 55-60%, mild LVH, moderate TR, mild MR. She was cardioverted on 02/27/11. She stopped her Pradaxa and started coumadin per Dr. Darral Dash. Her coumadin is being followed in his office.   She is here today for follow up. She is doing well. She has had no bleeding issue. No chest pain or SOB. No palpitations. She is exercising every day.   Primary Care Physician: Guerry Bruin  Past Medical History  Diagnosis Date  . Hyperthyroidism   . Hyperlipidemia   . Hypertension   . H/O: hysterectomy   . Ketoacidosis, diabetic, no coma, non-insulin dependent   . Vitamin B12 deficiency   . Glaucoma 2012    [place]  . History of colonoscopy 01/25/2010  . History of mammogram 08/04/2009  . Diabetes mellitus   . Atrial fibrillation     Past Surgical History  Procedure Laterality Date  . Bilateral foot surgery    . Abdominal hysterectomy    . Tubal ligation    . Rotator cuff repair    . Laparoscopic cholecystectomy    . Cardioversion  02/27/2011    Procedure: CARDIOVERSION;  Surgeon: Marca Ancona, MD;  Location: [facility] OR;  Service: Cardiovascular;  Laterality: N/A;    Current Outpatient Prescriptions  Medication Sig Dispense Refill  . cyanocobalamin 100 MCG tablet Take 100 mcg by mouth daily.        . ferrous sulfate 325 (65 FE) MG tablet Take 325 mg by mouth daily  with breakfast.        . glimepiride (AMARYL) 2 MG tablet 1 tab daily      . levothyroxine (SYNTHROID, LEVOTHROID) 125 MCG tablet Take 125 mcg by mouth daily.        Marland Kitchen linagliptin (TRADJENTA) 5 MG TABS tablet Take 5 mg by mouth daily.        Marland Kitchen lisinopril (PRINIVIL,ZESTRIL) 40 MG tablet Take 40 mg by mouth daily.        . metFORMIN (GLUCOPHAGE) 850 MG tablet Take 850 mg by mouth 2 (two) times daily with a meal.        . Multiple Vitamins-Minerals (MULTIVITAMIN WITH MINERALS) tablet Take 1 tablet by mouth daily.        . rosuvastatin (CRESTOR) 10 MG tablet Take 5 mg by mouth daily.        . solifenacin (VESICARE) 5 MG tablet Take 5 mg by mouth daily.       . WARFARIN SODIUM PO Take by mouth as directed.       No current facility-administered medications for this visit.    Allergies  Allergen Reactions  . Penicillins     Patient stated that throat closes up, and she breaks out in hives.     History   Social History  . Marital Status: Married    Spouse Name: N/A  Number of Children: 2  . Years of Education: N/A   Occupational History  .  Other    Worked at [employer] History Main Topics  . Smoking status: Never Smoker   . Smokeless tobacco: Not on file  . Alcohol Use: No  . Drug Use: No  . Sexually Active: Not on file   Other Topics Concern  . Not on file   Social History Narrative   Patient since 68   Husband with prostate cancer   10-siblings-no cancer    Family History  Problem Relation Age of Onset  . Heart failure Father 83    enlarged heart  . Pneumonia Mother 41  . Diabetes Mother 41  . Cancer Neg Hx     Review of Systems:  As stated in the HPI and otherwise negative.   BP 126/84  Pulse 73  Ht 5' 3.5" (1.613 m)  Wt 232 lb (105.235 kg)  BMI 40.45 kg/m2  Physical Examination: General: Well developed, well nourished, NAD HEENT: OP clear, mucus membranes moist SKIN: warm, dry. No rashes. Neuro: No focal deficits Musculoskeletal: Muscle  strength 5/5 all ext Psychiatric: Mood and affect normal Neck: No JVD, no carotid bruits, no thyromegaly, no lymphadenopathy. Lungs:Clear bilaterally, no wheezes, rhonci, crackles Cardiovascular: Regular rate and rhythm. No murmurs, gallops or rubs. Abdomen:Soft. Bowel sounds present. Non-tender.  Extremities: No lower extremity edema. Pulses are 2 + in the bilateral DP/PT.  EKG: Sinus, rate 73 bpm. Non-specific QTc 484 msec.   Assessment and Plan:   1. Atrial fibrillation: She is in NSR today. Will continue Toprol. Will continue coumadin for anticoagulation (followed by Dr. Wylene Simmer).

## 2012-10-21 NOTE — Patient Instructions (Addendum)
Your physician wants you to follow-up in:  12 months.  You will receive a reminder letter in the mail two months in advance. If you don't receive a letter, please call our office to schedule the follow-up appointment.   

## 2012-11-18 DIAGNOSIS — Z7901 Long term (current) use of anticoagulants: Secondary | ICD-10-CM | POA: Diagnosis not present

## 2012-11-18 DIAGNOSIS — Z23 Encounter for immunization: Secondary | ICD-10-CM | POA: Diagnosis not present

## 2012-11-18 DIAGNOSIS — S0003XA Contusion of scalp, initial encounter: Secondary | ICD-10-CM | POA: Diagnosis not present

## 2012-11-18 DIAGNOSIS — I4891 Unspecified atrial fibrillation: Secondary | ICD-10-CM | POA: Diagnosis not present

## 2012-11-18 DIAGNOSIS — T148XXA Other injury of unspecified body region, initial encounter: Secondary | ICD-10-CM | POA: Diagnosis not present

## 2012-11-20 DIAGNOSIS — T148XXA Other injury of unspecified body region, initial encounter: Secondary | ICD-10-CM | POA: Diagnosis not present

## 2012-11-20 DIAGNOSIS — Z7901 Long term (current) use of anticoagulants: Secondary | ICD-10-CM | POA: Diagnosis not present

## 2012-11-20 DIAGNOSIS — I4891 Unspecified atrial fibrillation: Secondary | ICD-10-CM | POA: Diagnosis not present

## 2012-11-22 ENCOUNTER — Other Ambulatory Visit: Payer: Self-pay

## 2012-11-22 DIAGNOSIS — Z1231 Encounter for screening mammogram for malignant neoplasm of breast: Secondary | ICD-10-CM

## 2012-12-04 DIAGNOSIS — I4891 Unspecified atrial fibrillation: Secondary | ICD-10-CM | POA: Diagnosis not present

## 2012-12-04 DIAGNOSIS — Z7901 Long term (current) use of anticoagulants: Secondary | ICD-10-CM | POA: Diagnosis not present

## 2012-12-18 DIAGNOSIS — Z7901 Long term (current) use of anticoagulants: Secondary | ICD-10-CM | POA: Diagnosis not present

## 2012-12-18 DIAGNOSIS — I4891 Unspecified atrial fibrillation: Secondary | ICD-10-CM | POA: Diagnosis not present

## 2012-12-20 ENCOUNTER — Ambulatory Visit
Admission: RE | Admit: 2012-12-20 | Discharge: 2012-12-20 | Disposition: A | Discharge status: Home / Self Care | Payer: Medicare Other | Source: Ambulatory Visit

## 2012-12-20 DIAGNOSIS — Z1231 Encounter for screening mammogram for malignant neoplasm of breast: Secondary | ICD-10-CM

## 2013-01-02 DIAGNOSIS — E538 Deficiency of other specified B group vitamins: Secondary | ICD-10-CM | POA: Diagnosis not present

## 2013-01-02 DIAGNOSIS — N318 Other neuromuscular dysfunction of bladder: Secondary | ICD-10-CM | POA: Diagnosis not present

## 2013-01-02 DIAGNOSIS — E1129 Type 2 diabetes mellitus with other diabetic kidney complication: Secondary | ICD-10-CM | POA: Diagnosis not present

## 2013-01-02 DIAGNOSIS — E785 Hyperlipidemia, unspecified: Secondary | ICD-10-CM | POA: Diagnosis not present

## 2013-01-02 DIAGNOSIS — I10 Essential (primary) hypertension: Secondary | ICD-10-CM | POA: Diagnosis not present

## 2013-01-02 DIAGNOSIS — E039 Hypothyroidism, unspecified: Secondary | ICD-10-CM | POA: Diagnosis not present

## 2013-01-02 DIAGNOSIS — Z7901 Long term (current) use of anticoagulants: Secondary | ICD-10-CM | POA: Diagnosis not present

## 2013-01-02 DIAGNOSIS — I4891 Unspecified atrial fibrillation: Secondary | ICD-10-CM | POA: Diagnosis not present

## 2013-01-02 DIAGNOSIS — Z1331 Encounter for screening for depression: Secondary | ICD-10-CM | POA: Diagnosis not present

## 2013-01-14 DIAGNOSIS — I4891 Unspecified atrial fibrillation: Secondary | ICD-10-CM | POA: Diagnosis not present

## 2013-01-14 DIAGNOSIS — Z7901 Long term (current) use of anticoagulants: Secondary | ICD-10-CM | POA: Diagnosis not present

## 2013-01-27 DIAGNOSIS — H04129 Dry eye syndrome of unspecified lacrimal gland: Secondary | ICD-10-CM | POA: Diagnosis not present

## 2013-01-27 DIAGNOSIS — E1139 Type 2 diabetes mellitus with other diabetic ophthalmic complication: Secondary | ICD-10-CM | POA: Diagnosis not present

## 2013-01-27 DIAGNOSIS — E11319 Type 2 diabetes mellitus with unspecified diabetic retinopathy without macular edema: Secondary | ICD-10-CM | POA: Diagnosis not present

## 2013-01-27 DIAGNOSIS — H3500 Unspecified background retinopathy: Secondary | ICD-10-CM | POA: Diagnosis not present

## 2013-02-11 DIAGNOSIS — K1321 Leukoplakia of oral mucosa, including tongue: Secondary | ICD-10-CM | POA: Diagnosis not present

## 2013-02-11 DIAGNOSIS — K1370 Unspecified lesions of oral mucosa: Secondary | ICD-10-CM | POA: Diagnosis not present

## 2013-02-12 DIAGNOSIS — Z7901 Long term (current) use of anticoagulants: Secondary | ICD-10-CM | POA: Diagnosis not present

## 2013-02-12 DIAGNOSIS — I4891 Unspecified atrial fibrillation: Secondary | ICD-10-CM | POA: Diagnosis not present

## 2013-02-17 DIAGNOSIS — K1321 Leukoplakia of oral mucosa, including tongue: Secondary | ICD-10-CM | POA: Diagnosis not present

## 2013-02-17 DIAGNOSIS — K1370 Unspecified lesions of oral mucosa: Secondary | ICD-10-CM | POA: Diagnosis not present

## 2013-03-18 DIAGNOSIS — Z7901 Long term (current) use of anticoagulants: Secondary | ICD-10-CM | POA: Diagnosis not present

## 2013-03-18 DIAGNOSIS — I4891 Unspecified atrial fibrillation: Secondary | ICD-10-CM | POA: Diagnosis not present

## 2013-04-22 DIAGNOSIS — Z7901 Long term (current) use of anticoagulants: Secondary | ICD-10-CM | POA: Diagnosis not present

## 2013-04-22 DIAGNOSIS — I4891 Unspecified atrial fibrillation: Secondary | ICD-10-CM | POA: Diagnosis not present

## 2013-05-06 DIAGNOSIS — Z7901 Long term (current) use of anticoagulants: Secondary | ICD-10-CM | POA: Diagnosis not present

## 2013-05-06 DIAGNOSIS — E039 Hypothyroidism, unspecified: Secondary | ICD-10-CM | POA: Diagnosis not present

## 2013-05-06 DIAGNOSIS — H3500 Unspecified background retinopathy: Secondary | ICD-10-CM | POA: Diagnosis not present

## 2013-05-06 DIAGNOSIS — I10 Essential (primary) hypertension: Secondary | ICD-10-CM | POA: Diagnosis not present

## 2013-05-06 DIAGNOSIS — E785 Hyperlipidemia, unspecified: Secondary | ICD-10-CM | POA: Diagnosis not present

## 2013-05-06 DIAGNOSIS — E1129 Type 2 diabetes mellitus with other diabetic kidney complication: Secondary | ICD-10-CM | POA: Diagnosis not present

## 2013-05-06 DIAGNOSIS — Z1331 Encounter for screening for depression: Secondary | ICD-10-CM | POA: Diagnosis not present

## 2013-05-06 DIAGNOSIS — E538 Deficiency of other specified B group vitamins: Secondary | ICD-10-CM | POA: Diagnosis not present

## 2013-05-06 DIAGNOSIS — I4891 Unspecified atrial fibrillation: Secondary | ICD-10-CM | POA: Diagnosis not present

## 2013-05-06 DIAGNOSIS — N318 Other neuromuscular dysfunction of bladder: Secondary | ICD-10-CM | POA: Diagnosis not present

## 2013-05-20 DIAGNOSIS — L821 Other seborrheic keratosis: Secondary | ICD-10-CM | POA: Diagnosis not present

## 2013-05-20 DIAGNOSIS — L219 Seborrheic dermatitis, unspecified: Secondary | ICD-10-CM | POA: Diagnosis not present

## 2013-05-20 DIAGNOSIS — D235 Other benign neoplasm of skin of trunk: Secondary | ICD-10-CM | POA: Diagnosis not present

## 2013-05-21 DIAGNOSIS — I4891 Unspecified atrial fibrillation: Secondary | ICD-10-CM | POA: Diagnosis not present

## 2013-05-21 DIAGNOSIS — Z7901 Long term (current) use of anticoagulants: Secondary | ICD-10-CM | POA: Diagnosis not present

## 2013-06-12 DIAGNOSIS — I4891 Unspecified atrial fibrillation: Secondary | ICD-10-CM | POA: Diagnosis not present

## 2013-06-12 DIAGNOSIS — Z7901 Long term (current) use of anticoagulants: Secondary | ICD-10-CM | POA: Diagnosis not present

## 2013-07-15 DIAGNOSIS — I4891 Unspecified atrial fibrillation: Secondary | ICD-10-CM | POA: Diagnosis not present

## 2013-07-15 DIAGNOSIS — Z7901 Long term (current) use of anticoagulants: Secondary | ICD-10-CM | POA: Diagnosis not present

## 2013-08-14 DIAGNOSIS — I4891 Unspecified atrial fibrillation: Secondary | ICD-10-CM | POA: Diagnosis not present

## 2013-08-14 DIAGNOSIS — Z7901 Long term (current) use of anticoagulants: Secondary | ICD-10-CM | POA: Diagnosis not present

## 2013-09-08 DIAGNOSIS — H3500 Unspecified background retinopathy: Secondary | ICD-10-CM | POA: Diagnosis not present

## 2013-09-08 DIAGNOSIS — E785 Hyperlipidemia, unspecified: Secondary | ICD-10-CM | POA: Diagnosis not present

## 2013-09-08 DIAGNOSIS — N318 Other neuromuscular dysfunction of bladder: Secondary | ICD-10-CM | POA: Diagnosis not present

## 2013-09-08 DIAGNOSIS — E1129 Type 2 diabetes mellitus with other diabetic kidney complication: Secondary | ICD-10-CM | POA: Diagnosis not present

## 2013-09-08 DIAGNOSIS — I10 Essential (primary) hypertension: Secondary | ICD-10-CM | POA: Diagnosis not present

## 2013-09-08 DIAGNOSIS — E538 Deficiency of other specified B group vitamins: Secondary | ICD-10-CM | POA: Diagnosis not present

## 2013-09-08 DIAGNOSIS — Z7901 Long term (current) use of anticoagulants: Secondary | ICD-10-CM | POA: Diagnosis not present

## 2013-09-08 DIAGNOSIS — E039 Hypothyroidism, unspecified: Secondary | ICD-10-CM | POA: Diagnosis not present

## 2013-09-16 DIAGNOSIS — I4891 Unspecified atrial fibrillation: Secondary | ICD-10-CM | POA: Diagnosis not present

## 2013-09-16 DIAGNOSIS — Z7901 Long term (current) use of anticoagulants: Secondary | ICD-10-CM | POA: Diagnosis not present

## 2013-09-18 DIAGNOSIS — H04129 Dry eye syndrome of unspecified lacrimal gland: Secondary | ICD-10-CM | POA: Diagnosis not present

## 2013-09-18 DIAGNOSIS — Z961 Presence of intraocular lens: Secondary | ICD-10-CM | POA: Diagnosis not present

## 2013-09-18 DIAGNOSIS — E119 Type 2 diabetes mellitus without complications: Secondary | ICD-10-CM | POA: Diagnosis not present

## 2013-09-18 DIAGNOSIS — H52209 Unspecified astigmatism, unspecified eye: Secondary | ICD-10-CM | POA: Diagnosis not present

## 2013-09-26 DIAGNOSIS — I4891 Unspecified atrial fibrillation: Secondary | ICD-10-CM | POA: Diagnosis not present

## 2013-09-26 DIAGNOSIS — Z7901 Long term (current) use of anticoagulants: Secondary | ICD-10-CM | POA: Diagnosis not present

## 2013-10-21 ENCOUNTER — Ambulatory Visit (INDEPENDENT_AMBULATORY_CARE_PROVIDER_SITE_OTHER): Payer: Medicare Other | Admitting: Cardiovascular Disease

## 2013-10-21 ENCOUNTER — Encounter: Payer: Self-pay | Admitting: Cardiovascular Disease

## 2013-10-21 VITALS — BP 178/93 | HR 106 | Ht 64.0 in | Wt 235.8 lb

## 2013-10-21 DIAGNOSIS — I4891 Unspecified atrial fibrillation: Secondary | ICD-10-CM

## 2013-10-21 DIAGNOSIS — I10 Essential (primary) hypertension: Secondary | ICD-10-CM

## 2013-10-21 DIAGNOSIS — I480 Paroxysmal atrial fibrillation: Secondary | ICD-10-CM

## 2013-10-21 MED ORDER — METOPROLOL SUCCINATE ER 50 MG PO TB24: 50.0000 mg | ORAL_TABLET | Freq: Every day | ORAL | Status: DC

## 2013-10-21 NOTE — Patient Instructions (Addendum)
Your physician recommends that you schedule a follow-up appointment in:  3-4 months. --January 29, 2014 at 1:45  Your physician has recommended you make the following change in your medication:  Increase Toprol to 50 mg by mouth daily

## 2013-10-21 NOTE — Progress Notes (Signed)
History of Present Illness: 79 yo WF with history of DM, hypothyroidism, HTN, HLD, Vitamin B12 deficiency, iron deficiency here today for cardiac followup. I saw her in October 2012 as a new patient. She presented for her stress test as ordered by Dr. Osborne Casco and was found to be in atrial fibrillation with HR of 115. NO awareness of palpitations. No stress induced ischemia on nuclear study. Low risk study. Normal LVEF. I started Toprol XL and Pradaxa. Echo on 01/12/11 with normal LV size and function, LVEF of 55-60%, mild LVH, moderate TR, mild MR. She was cardioverted on 02/27/11. She stopped her Pradaxa and started coumadin per Dr. Odette Fraction. Her coumadin is being followed in his office.    She is here today for follow up. She is doing well. She has had no bleeding issue. No chest pain or SOB. No palpitations. She is exercising every day. EKG today with atrial fib.   Primary Care Physician: Domenick Gong  Past Medical History  Diagnosis Date  . Hyperthyroidism   . Hyperlipidemia   . Hypertension   . H/O: hysterectomy   . Ketoacidosis, diabetic, no coma, non-insulin dependent   . Vitamin B12 deficiency   . Glaucoma 2012    [place]  . History of colonoscopy 01/25/2010  . History of mammogram 08/04/2009  . Diabetes mellitus   . Atrial fibrillation     Past Surgical History  Procedure Laterality Date  . Bilateral foot surgery    . Abdominal hysterectomy    . Tubal ligation    . Rotator cuff repair    . Laparoscopic cholecystectomy    . Cardioversion  02/27/2011    Procedure: CARDIOVERSION;  Surgeon: Loralie Champagne, MD;  Location: [facility] OR;  Service: Cardiovascular;  Laterality: N/A;    Current Outpatient Prescriptions  Medication Sig Dispense Refill  . cyanocobalamin 100 MCG tablet Take 100 mcg by mouth daily.        . ferrous sulfate 325 (65 FE) MG tablet Take 325 mg by mouth daily with breakfast.        . glimepiride (AMARYL) 2 MG tablet 1 tab daily      . levothyroxine  (SYNTHROID, LEVOTHROID) 125 MCG tablet Take 125 mcg by mouth daily.        Marland Kitchen lisinopril (PRINIVIL,ZESTRIL) 40 MG tablet Take 40 mg by mouth daily.        Marland Kitchen losartan (COZAAR) 50 MG tablet Take 50 mg by mouth daily.      . metFORMIN (GLUCOPHAGE) 850 MG tablet Take 850 mg by mouth 2 (two) times daily with a meal.        . metoprolol succinate (TOPROL XL) 25 MG 24 hr tablet Take 1 tablet (25 mg total) by mouth daily.  90 tablet  3  . Multiple Vitamins-Minerals (MULTIVITAMIN WITH MINERALS) tablet Take 1 tablet by mouth daily.        . rosuvastatin (CRESTOR) 10 MG tablet Take 5 mg by mouth daily.        . solifenacin (VESICARE) 5 MG tablet Take 5 mg by mouth daily.       Marland Kitchen VICTOZA 18 MG/3ML SOPN Inject 6 Units as directed daily.      . WARFARIN SODIUM PO Take by mouth as directed.       No current facility-administered medications for this visit.    Allergies  Allergen Reactions  . Penicillins     Patient stated that throat closes up, and she breaks out in hives.  History   Social History  . Marital Status: Married    Spouse Name: N/A    Number of Children: 2  . Years of Education: N/A   Occupational History  .  Other    Worked at [employer] Top[employer]  . Smoking status: Never Smoker   . Smokeless tobacco: Not on file  . Alcohol Use: No  . Drug Use: No  . Sexual Activity: Not on file   Other Topics Concern  . Not on file   Social History Narrative   Patient since 85   Husband with prostate cancer   10-siblings-no cancer    Family History  Problem Relation Age of Onset  . Heart failure Father 60    enlarged heart  . Pneumonia Mother 34  . Diabetes Mother 74  . Cancer Neg Hx     Review of Systems:  As stated in the HPI and otherwise negative.   BP 178/93  Pulse 106  Ht 5\' 4"  (1.626 m)  Wt 235 lb 12.8 oz (106.958 kg)  BMI 40.45 kg/m2  Physical Examination: General: Well developed, well nourished, NAD HEENT: OP clear, mucus membranes  moist SKIN: warm, dry. No rashes. Neuro: No focal deficits Musculoskeletal: Muscle strength 5/5 all ext Psychiatric: Mood and affect normal Neck: No JVD, no carotid bruits, no thyromegaly, no lymphadenopathy. Lungs:Clear bilaterally, no wheezes, rhonci, crackles Cardiovascular: Regular rate and rhythm. No murmurs, gallops or rubs. Abdomen:Soft. Bowel sounds present. Non-tender.  Extremities: No lower extremity edema. Pulses are 2 + in the bilateral DP/PT.  EKG: Atrial fib, rate 106 bpm.   Assessment and Plan:   1. Atrial fibrillation: She is in atrial fib today. She has not been known to have recurrence since her DCCV in 2012. HR is 100-105 bpm today. She is asymptomatic. Will increase Toprol to 50 mg daily for better rate control. Will continue coumadin for anticoagulation (followed by Dr. Osborne Casco). No bleeding issues.   2. HTN: BP elevated. Will increase Toprol as above

## 2013-10-22 DIAGNOSIS — I4891 Unspecified atrial fibrillation: Secondary | ICD-10-CM | POA: Diagnosis not present

## 2013-10-22 DIAGNOSIS — E1129 Type 2 diabetes mellitus with other diabetic kidney complication: Secondary | ICD-10-CM | POA: Diagnosis not present

## 2013-10-22 DIAGNOSIS — Z7901 Long term (current) use of anticoagulants: Secondary | ICD-10-CM | POA: Diagnosis not present

## 2013-11-26 DIAGNOSIS — I4891 Unspecified atrial fibrillation: Secondary | ICD-10-CM | POA: Diagnosis not present

## 2013-11-26 DIAGNOSIS — Z7901 Long term (current) use of anticoagulants: Secondary | ICD-10-CM | POA: Diagnosis not present

## 2013-12-03 ENCOUNTER — Other Ambulatory Visit: Payer: Self-pay | Admitting: *Deleted

## 2013-12-03 MED ORDER — METOPROLOL SUCCINATE ER 50 MG PO TB24: 50.0000 mg | ORAL_TABLET | Freq: Every day | ORAL | Status: DC

## 2013-12-05 DIAGNOSIS — I4891 Unspecified atrial fibrillation: Secondary | ICD-10-CM | POA: Diagnosis not present

## 2013-12-05 DIAGNOSIS — Z23 Encounter for immunization: Secondary | ICD-10-CM | POA: Diagnosis not present

## 2013-12-05 DIAGNOSIS — I10 Essential (primary) hypertension: Secondary | ICD-10-CM | POA: Diagnosis not present

## 2013-12-05 DIAGNOSIS — E1129 Type 2 diabetes mellitus with other diabetic kidney complication: Secondary | ICD-10-CM | POA: Diagnosis not present

## 2013-12-05 DIAGNOSIS — H3500 Unspecified background retinopathy: Secondary | ICD-10-CM | POA: Diagnosis not present

## 2013-12-05 DIAGNOSIS — E785 Hyperlipidemia, unspecified: Secondary | ICD-10-CM | POA: Diagnosis not present

## 2013-12-05 DIAGNOSIS — Z7901 Long term (current) use of anticoagulants: Secondary | ICD-10-CM | POA: Diagnosis not present

## 2013-12-05 DIAGNOSIS — E039 Hypothyroidism, unspecified: Secondary | ICD-10-CM | POA: Diagnosis not present

## 2013-12-09 DIAGNOSIS — Z7901 Long term (current) use of anticoagulants: Secondary | ICD-10-CM | POA: Diagnosis not present

## 2013-12-09 DIAGNOSIS — E785 Hyperlipidemia, unspecified: Secondary | ICD-10-CM | POA: Diagnosis not present

## 2013-12-09 DIAGNOSIS — E039 Hypothyroidism, unspecified: Secondary | ICD-10-CM | POA: Diagnosis not present

## 2013-12-09 DIAGNOSIS — I4891 Unspecified atrial fibrillation: Secondary | ICD-10-CM | POA: Diagnosis not present

## 2013-12-09 DIAGNOSIS — I10 Essential (primary) hypertension: Secondary | ICD-10-CM | POA: Diagnosis not present

## 2013-12-09 DIAGNOSIS — E1129 Type 2 diabetes mellitus with other diabetic kidney complication: Secondary | ICD-10-CM | POA: Diagnosis not present

## 2013-12-09 DIAGNOSIS — H3500 Unspecified background retinopathy: Secondary | ICD-10-CM | POA: Diagnosis not present

## 2013-12-30 DIAGNOSIS — I480 Paroxysmal atrial fibrillation: Secondary | ICD-10-CM | POA: Diagnosis not present

## 2013-12-30 DIAGNOSIS — Z7901 Long term (current) use of anticoagulants: Secondary | ICD-10-CM | POA: Diagnosis not present

## 2014-01-28 DIAGNOSIS — Z7901 Long term (current) use of anticoagulants: Secondary | ICD-10-CM | POA: Diagnosis not present

## 2014-01-28 DIAGNOSIS — I480 Paroxysmal atrial fibrillation: Secondary | ICD-10-CM | POA: Diagnosis not present

## 2014-01-29 ENCOUNTER — Encounter: Payer: Self-pay | Admitting: Cardiovascular Disease

## 2014-01-29 ENCOUNTER — Ambulatory Visit (INDEPENDENT_AMBULATORY_CARE_PROVIDER_SITE_OTHER): Payer: Medicare Other | Admitting: Cardiovascular Disease

## 2014-01-29 VITALS — BP 130/80 | HR 85 | Ht 64.0 in | Wt 235.0 lb

## 2014-01-29 DIAGNOSIS — I10 Essential (primary) hypertension: Secondary | ICD-10-CM | POA: Diagnosis not present

## 2014-01-29 DIAGNOSIS — I481 Persistent atrial fibrillation: Secondary | ICD-10-CM | POA: Diagnosis not present

## 2014-01-29 DIAGNOSIS — I4819 Other persistent atrial fibrillation: Secondary | ICD-10-CM

## 2014-01-29 NOTE — Progress Notes (Signed)
History of Present Illness: 79 yo WF with history of DM, hypothyroidism, HTN, HLD, Vitamin B12 deficiency, iron deficiency here today for cardiac followup. I saw her in October 2012 as a new patient. She presented for her stress test as ordered by Dr. Osborne Casco and was found to be in atrial fibrillation with HR of 115. NO awareness of palpitations. No stress induced ischemia on nuclear study. Low risk study. Normal LVEF. I started Toprol XL and Pradaxa. Echo on 01/12/11 with normal LV size and function, LVEF of 55-60%, mild LVH, moderate TR, mild MR. She was cardioverted on 02/27/11. She stopped her Pradaxa and started coumadin per Dr. Odette Fraction. Her coumadin is being followed in his office.  She was seen here in August 2015 for planned f/u and was in atrial fib. HR was 100-110. I increased her Toprol to 50 mg per day.   She is here today for follow up. She is doing well. She has had no bleeding issue. No chest pain or SOB. No palpitations. She is exercising every day on her [facility] for 20 minutes then 30 minutes on a bicycle and 10 minutes of light weights.   Primary Care Physician: Domenick Gong  Past Medical History  Diagnosis Date  . Hyperthyroidism   . Hyperlipidemia   . Hypertension   . H/O: hysterectomy   . Ketoacidosis, diabetic, no coma, non-insulin dependent   . Vitamin B12 deficiency   . Glaucoma 2012    [place]  . History of colonoscopy 01/25/2010  . History of mammogram 08/04/2009  . Diabetes mellitus   . Atrial fibrillation     Past Surgical History  Procedure Laterality Date  . Bilateral foot surgery    . Abdominal hysterectomy    . Tubal ligation    . Rotator cuff repair    . Laparoscopic cholecystectomy    . Cardioversion  02/27/2011    Procedure: CARDIOVERSION;  Surgeon: Loralie Champagne, MD;  Location: [facility] OR;  Service: Cardiovascular;  Laterality: N/A;    Current Outpatient Prescriptions  Medication Sig Dispense Refill  . cyanocobalamin 100 MCG tablet Take  100 mcg by mouth daily.      . Dulaglutide (TRULICITY [place]) Inject into the skin once a week.    . ferrous sulfate 325 (65 FE) MG tablet Take 325 mg by mouth daily with breakfast.      . glimepiride (AMARYL) 2 MG tablet 1 tab daily    . levothyroxine (SYNTHROID, LEVOTHROID) 125 MCG tablet Take 125 mcg by mouth daily.      Marland Kitchen lisinopril (PRINIVIL,ZESTRIL) 40 MG tablet Take 40 mg by mouth daily.      Marland Kitchen losartan (COZAAR) 50 MG tablet Take 50 mg by mouth daily.    . metFORMIN (GLUCOPHAGE) 850 MG tablet Take 850 mg by mouth 2 (two) times daily with a meal.      . metoprolol succinate (TOPROL-XL) 50 MG 24 hr tablet Take 1 tablet (50 mg total) by mouth daily. 90 tablet 1  . Multiple Vitamins-Minerals (MULTIVITAMIN WITH MINERALS) tablet Take 1 tablet by mouth daily.      . rosuvastatin (CRESTOR) 10 MG tablet Take 5 mg by mouth daily.      . solifenacin (VESICARE) 5 MG tablet Take 5 mg by mouth daily.     . WARFARIN SODIUM PO Take by mouth as directed.     No current facility-administered medications for this visit.    Allergies  Allergen Reactions  . Penicillins     Patient stated  that throat closes up, and she breaks out in hives.     History   Social History  . Marital Status: Married    Spouse Name: N/A    Number of Children: 2  . Years of Education: N/A   Occupational History  .  Other    Worked at [employer] Topics  . Smoking status: Never Smoker   . Smokeless tobacco: Not on file  . Alcohol Use: No  . Drug Use: No  . Sexual Activity: Not on file   Other Topics Concern  . Not on file   Social History Narrative   Patient since 50   Husband with prostate cancer   10-siblings-no cancer    Family History  Problem Relation Age of Onset  . Heart failure Father 49    enlarged heart  . Pneumonia Mother 76  . Diabetes Mother 72  . Cancer Neg Hx     Review of Systems:  As stated in the HPI and otherwise negative.   BP 130/80 mmHg  Pulse 85  Ht 5\' 4"   (1.626 m)  Wt 235 lb (106.595 kg)  BMI 40.32 kg/m2  Physical Examination: General: Well developed, well nourished, NAD HEENT: OP clear, mucus membranes moist SKIN: warm, dry. No rashes. Neuro: No focal deficits Musculoskeletal: Muscle strength 5/5 all ext Psychiatric: Mood and affect normal Neck: No JVD, no carotid bruits, no thyromegaly, no lymphadenopathy. Lungs:Clear bilaterally, no wheezes, rhonci, crackles Cardiovascular: Regular rate and rhythm. No murmurs, gallops or rubs. Abdomen:Soft. Bowel sounds present. Non-tender.  Extremities: No lower extremity edema. Pulses are 2 + in the bilateral DP/PT.  EKG: Atrial fib, rate 85 bpm. Poor R wave progression precordial leads.   Assessment and Plan:   1. Atrial fibrillation:  Rate controlled. Continue Toprol XL 50 mg po Qdaily. Continue coumadin. No bleeding issues.   2. HTN: BP controlled. No changes

## 2014-01-29 NOTE — Patient Instructions (Signed)
Your physician wants you to follow-up in:  12 months.  You will receive a reminder letter in the mail two months in advance. If you don't receive a letter, please call our office to schedule the follow-up appointment.   

## 2014-02-24 ENCOUNTER — Encounter: Payer: Self-pay | Admitting: Cardiovascular Disease

## 2014-03-10 DIAGNOSIS — Z7901 Long term (current) use of anticoagulants: Secondary | ICD-10-CM | POA: Diagnosis not present

## 2014-03-10 DIAGNOSIS — R8299 Other abnormal findings in urine: Secondary | ICD-10-CM | POA: Diagnosis not present

## 2014-03-10 DIAGNOSIS — I480 Paroxysmal atrial fibrillation: Secondary | ICD-10-CM | POA: Diagnosis not present

## 2014-03-10 DIAGNOSIS — I10 Essential (primary) hypertension: Secondary | ICD-10-CM | POA: Diagnosis not present

## 2014-03-10 DIAGNOSIS — E538 Deficiency of other specified B group vitamins: Secondary | ICD-10-CM | POA: Diagnosis not present

## 2014-03-10 DIAGNOSIS — E1129 Type 2 diabetes mellitus with other diabetic kidney complication: Secondary | ICD-10-CM | POA: Diagnosis not present

## 2014-03-10 DIAGNOSIS — E039 Hypothyroidism, unspecified: Secondary | ICD-10-CM | POA: Diagnosis not present

## 2014-03-10 DIAGNOSIS — E785 Hyperlipidemia, unspecified: Secondary | ICD-10-CM | POA: Diagnosis not present

## 2014-03-16 DIAGNOSIS — H3500 Unspecified background retinopathy: Secondary | ICD-10-CM | POA: Diagnosis not present

## 2014-03-16 DIAGNOSIS — Z0000 Encounter for general adult medical examination without abnormal findings: Secondary | ICD-10-CM | POA: Diagnosis not present

## 2014-03-16 DIAGNOSIS — E538 Deficiency of other specified B group vitamins: Secondary | ICD-10-CM | POA: Diagnosis not present

## 2014-03-16 DIAGNOSIS — N3281 Overactive bladder: Secondary | ICD-10-CM | POA: Diagnosis not present

## 2014-03-16 DIAGNOSIS — I480 Paroxysmal atrial fibrillation: Secondary | ICD-10-CM | POA: Diagnosis not present

## 2014-03-16 DIAGNOSIS — Z6841 Body Mass Index (BMI) 40.0 and over, adult: Secondary | ICD-10-CM | POA: Diagnosis not present

## 2014-03-16 DIAGNOSIS — Z7901 Long term (current) use of anticoagulants: Secondary | ICD-10-CM | POA: Diagnosis not present

## 2014-03-16 DIAGNOSIS — I10 Essential (primary) hypertension: Secondary | ICD-10-CM | POA: Diagnosis not present

## 2014-03-16 DIAGNOSIS — E785 Hyperlipidemia, unspecified: Secondary | ICD-10-CM | POA: Diagnosis not present

## 2014-03-16 DIAGNOSIS — E039 Hypothyroidism, unspecified: Secondary | ICD-10-CM | POA: Diagnosis not present

## 2014-03-16 DIAGNOSIS — E1129 Type 2 diabetes mellitus with other diabetic kidney complication: Secondary | ICD-10-CM | POA: Diagnosis not present

## 2014-03-16 DIAGNOSIS — Z1389 Encounter for screening for other disorder: Secondary | ICD-10-CM | POA: Diagnosis not present

## 2014-04-09 DIAGNOSIS — Z7901 Long term (current) use of anticoagulants: Secondary | ICD-10-CM | POA: Diagnosis not present

## 2014-04-09 DIAGNOSIS — I480 Paroxysmal atrial fibrillation: Secondary | ICD-10-CM | POA: Diagnosis not present

## 2014-04-09 DIAGNOSIS — E1129 Type 2 diabetes mellitus with other diabetic kidney complication: Secondary | ICD-10-CM | POA: Diagnosis not present

## 2014-04-13 ENCOUNTER — Emergency Department (HOSPITAL_COMMUNITY)
Admission: EM | Admit: 2014-04-13 | Discharge: 2014-04-14 | Disposition: A | Discharge status: Home / Self Care | Payer: Medicare Other | Attending: Emergency Medicine | Admitting: Emergency Medicine

## 2014-04-13 ENCOUNTER — Encounter (HOSPITAL_COMMUNITY): Payer: Self-pay | Admitting: Emergency Medicine

## 2014-04-13 DIAGNOSIS — I499 Cardiac arrhythmia, unspecified: Secondary | ICD-10-CM | POA: Diagnosis not present

## 2014-04-13 DIAGNOSIS — E785 Hyperlipidemia, unspecified: Secondary | ICD-10-CM | POA: Insufficient documentation

## 2014-04-13 DIAGNOSIS — Z90710 Acquired absence of both cervix and uterus: Secondary | ICD-10-CM | POA: Insufficient documentation

## 2014-04-13 DIAGNOSIS — Z880 Allergy status to penicillin: Secondary | ICD-10-CM | POA: Diagnosis not present

## 2014-04-13 DIAGNOSIS — E538 Deficiency of other specified B group vitamins: Secondary | ICD-10-CM | POA: Diagnosis not present

## 2014-04-13 DIAGNOSIS — Z79899 Other long term (current) drug therapy: Secondary | ICD-10-CM | POA: Diagnosis not present

## 2014-04-13 DIAGNOSIS — R040 Epistaxis: Secondary | ICD-10-CM

## 2014-04-13 DIAGNOSIS — Z7901 Long term (current) use of anticoagulants: Secondary | ICD-10-CM | POA: Diagnosis not present

## 2014-04-13 DIAGNOSIS — I10 Essential (primary) hypertension: Secondary | ICD-10-CM | POA: Insufficient documentation

## 2014-04-13 DIAGNOSIS — E0590 Thyrotoxicosis, unspecified without thyrotoxic crisis or storm: Secondary | ICD-10-CM | POA: Insufficient documentation

## 2014-04-13 DIAGNOSIS — E1310 Other specified diabetes mellitus with ketoacidosis without coma: Secondary | ICD-10-CM | POA: Diagnosis not present

## 2014-04-13 DIAGNOSIS — I4891 Unspecified atrial fibrillation: Secondary | ICD-10-CM | POA: Insufficient documentation

## 2014-04-13 NOTE — ED Notes (Signed)
Pt states her nose started bleeding today at 2pm and bled til 4pm  Pt states it has been bleeding off and on since then  Bleeding is uncontrolled upon arrival to triage  Pt is on coumadin

## 2014-04-14 DIAGNOSIS — R040 Epistaxis: Secondary | ICD-10-CM | POA: Diagnosis not present

## 2014-04-14 LAB — CBC WITH DIFFERENTIAL/PLATELET
BASOS ABS: 0.1 10*3/uL (ref 0.0–0.1)
BASOS PCT: 1 % (ref 0–1)
EOS ABS: 0.4 10*3/uL (ref 0.0–0.7)
EOS PCT: 5 % (ref 0–5)
HEMATOCRIT: 42 % (ref 36.0–46.0)
HEMOGLOBIN: 13.5 g/dL (ref 12.0–15.0)
LYMPHS ABS: 3.1 10*3/uL (ref 0.7–4.0)
Lymphocytes Relative: 34 % (ref 12–46)
MCH: 30.2 pg (ref 26.0–34.0)
MCHC: 32.1 g/dL (ref 30.0–36.0)
MCV: 94 fL (ref 78.0–100.0)
Monocytes Absolute: 1 10*3/uL (ref 0.1–1.0)
Monocytes Relative: 10 % (ref 3–12)
NEUTROS ABS: 4.6 10*3/uL (ref 1.7–7.7)
Neutrophils Relative %: 50 % (ref 43–77)
PLATELETS: 233 10*3/uL (ref 150–400)
RBC: 4.47 MIL/uL (ref 3.87–5.11)
RDW: 13.5 % (ref 11.5–15.5)
WBC: 9.1 10*3/uL (ref 4.0–10.5)

## 2014-04-14 LAB — BASIC METABOLIC PANEL
ANION GAP: 7 (ref 5–15)
BUN: 16 mg/dL (ref 6–23)
CALCIUM: 8.8 mg/dL (ref 8.4–10.5)
CO2: 25 mmol/L (ref 19–32)
CREATININE: 0.69 mg/dL (ref 0.50–1.10)
Chloride: 106 mmol/L (ref 96–112)
GFR, EST NON AFRICAN AMERICAN: 81 mL/min — AB (ref >90)
GLUCOSE: 152 mg/dL — AB (ref 70–99)
POTASSIUM: 3.9 mmol/L (ref 3.5–5.1)
Sodium: 138 mmol/L (ref 135–145)

## 2014-04-14 LAB — PROTIME-INR
INR: 2.36 — ABNORMAL HIGH (ref 0.00–1.49)
Prothrombin Time: 26 seconds — ABNORMAL HIGH (ref 11.6–15.2)

## 2014-04-14 MED ORDER — CLINDAMYCIN HCL 300 MG PO CAPS
300.0000 mg | ORAL_CAPSULE | Freq: Once | ORAL | Status: AC
  Administered 2014-04-14: 300 mg via ORAL
  Filled 2014-04-14: qty 1

## 2014-04-14 MED ORDER — SILVER NITRATE-POT NITRATE 75-25 % EX MISC
1.0000 | Freq: Once | CUTANEOUS | Status: AC
  Administered 2014-04-14: 1 via TOPICAL
  Filled 2014-04-14: qty 1

## 2014-04-14 MED ORDER — CLINDAMYCIN HCL 300 MG PO CAPS: 300.0000 mg | ORAL_CAPSULE | Freq: Four times a day (QID) | ORAL | Status: DC

## 2014-04-14 MED ORDER — TRANEXAMIC ACID 100 MG/ML IV SOLN
500.0000 mg | Freq: Once | INTRAVENOUS | Status: AC
  Administered 2014-04-14: 500 mg via INTRAVENOUS
  Filled 2014-04-14: qty 10

## 2014-04-14 NOTE — ED Notes (Signed)
Nose bleeding controlled. Pt resting quietly with eyes closed. Appears in no distress.

## 2014-04-14 NOTE — ED Provider Notes (Signed)
CSN: [id number]     Arrival date & time 04/13/14  2344 History   First MD Initiated Contact with Patient 04/14/14 0004     Chief Complaint  Patient presents with  . Epistaxis     (Consider location/radiation/quality/duration/timing/severity/associated sxs/prior Treatment) HPI 80 year old female presents to emergency department with complaint of nosebleed.  She reports onset of right sided nosebleed yesterday.  She reports bleeding from 2 until about 4.  She is on Coumadin for A. fib.  After holding pressure.  She was able to get the bleeding to stop.  Starting around 10 PM the bleeding returned and has been nonstop.  No prior history of same. Past Medical History  Diagnosis Date  . Hyperthyroidism   . Hyperlipidemia   . Hypertension   . H/O: hysterectomy   . Ketoacidosis, diabetic, no coma, non-insulin dependent   . Vitamin B12 deficiency   . History of colonoscopy 01/25/2010  . History of mammogram 08/04/2009  . Diabetes mellitus   . Atrial fibrillation    Past Surgical History  Procedure Laterality Date  . Bilateral foot surgery    . Abdominal hysterectomy    . Tubal ligation    . Rotator cuff repair    . Laparoscopic cholecystectomy    . Cardioversion  02/27/2011    Procedure: CARDIOVERSION;  Surgeon: Loralie Champagne, MD;  Location: [facility] OR;  Service: Cardiovascular;  Laterality: N/A;   Family History  Problem Relation Age of Onset  . Heart failure Father 23    enlarged heart  . Pneumonia Mother 2  . Diabetes Mother 16  . Cancer Neg Hx    History  Substance Use Topics  . Smoking status: Never Smoker   . Smokeless tobacco: Not on file  . Alcohol Use: No   OB History    No data available     Review of Systems  See History of Present Illness; otherwise all other systems are reviewed and negative   Allergies  Penicillins  Home Medications   Prior to Admission medications   Medication Sig Start Date End Date Taking? Authorizing Provider  cyanocobalamin 100  MCG tablet Take 100 mcg by mouth daily.     Yes Historical Provider, MD  Dulaglutide (TRULICITY [place]) Inject into the skin once a week.   Yes Historical Provider, MD  ferrous sulfate 325 (65 FE) MG tablet Take 325 mg by mouth daily with breakfast.     Yes Historical Provider, MD  glimepiride (AMARYL) 2 MG tablet 1 tab daily 09/11/11  Yes Historical Provider, MD  levothyroxine (SYNTHROID, LEVOTHROID) 125 MCG tablet Take 125 mcg by mouth daily.     Yes Historical Provider, MD  lisinopril (PRINIVIL,ZESTRIL) 40 MG tablet Take 40 mg by mouth daily.     Yes Historical Provider, MD  losartan (COZAAR) 50 MG tablet Take 50 mg by mouth daily. 10/18/13  Yes Historical Provider, MD  metFORMIN (GLUCOPHAGE) 850 MG tablet Take 850 mg by mouth 2 (two) times daily with a meal.     Yes Historical Provider, MD  metoprolol succinate (TOPROL-XL) 50 MG 24 hr tablet Take 1 tablet (50 mg total) by mouth daily. 12/03/13 12/03/14 Yes Burnell Blanks, MD  Multiple Vitamins-Minerals (MULTIVITAMIN WITH MINERALS) tablet Take 1 tablet by mouth daily.     Yes Historical Provider, MD  rosuvastatin (CRESTOR) 10 MG tablet Take 5 mg by mouth daily.     Yes Historical Provider, MD  solifenacin (VESICARE) 5 MG tablet Take 5 mg by mouth daily.  Yes Historical Provider, MD  WARFARIN SODIUM PO Take by mouth as directed.   Yes Historical Provider, MD   BP 174/93 mmHg  Pulse 96  Temp(Src) 97.6 F (36.4 C) (Oral)  Resp 18  SpO2 96% Physical Exam  Constitutional: She is oriented to person, place, and time. She appears well-developed and well-nourished.  HENT:  Head: Normocephalic and atraumatic.  Right Ear: External ear normal.  Left Ear: External ear normal.  Mouth/Throat: Oropharynx is clear and moist.  Patient has small pulsatile bleeding vessel to the right anterior plexus.  No bleeding down the back of throat noted.  No bleeding in the left naris  Eyes: Conjunctivae and EOM are normal. Pupils are equal, round, and reactive to  light.  Neck: Normal range of motion. Neck supple. No JVD present. No tracheal deviation present. No thyromegaly present.  Cardiovascular: Normal rate, normal heart sounds and intact distal pulses.  Exam reveals no gallop and no friction rub.   No murmur heard. Irregular rate  Pulmonary/Chest: Effort normal and breath sounds normal. No stridor. No respiratory distress. She has no wheezes. She has no rales. She exhibits no tenderness.  Abdominal: Soft. Bowel sounds are normal. She exhibits no distension and no mass. There is no tenderness. There is no rebound and no guarding.  Musculoskeletal: Normal range of motion. She exhibits no edema or tenderness.  Lymphadenopathy:    She has no cervical adenopathy.  Neurological: She is alert and oriented to person, place, and time. She displays normal reflexes. She exhibits normal muscle tone. Coordination normal.  Skin: Skin is warm and dry. No rash noted. No erythema. No pallor.  Psychiatric: She has a normal mood and affect. Her behavior is normal. Judgment and thought content normal.  Nursing note and vitals reviewed.   ED Course  EPISTAXIS MANAGEMENT Date/Time: 04/14/2014 3:00 AM Performed by: Kalman Drape Authorized by: Kalman Drape Consent: Verbal consent obtained. Patient sedated: no Treatment site: right anterior Repair method: silver nitrate, merocel sponge, nasal balloon and anterior pack Post-procedure assessment: bleeding stopped Treatment complexity: complex Recurrence: recurrence of recent bleed Patient tolerance: Patient tolerated the procedure well with no immediate complications Comments:  Bleed initially cauterized by silver nitrite, but had rebleed. Cautery powder used , with good success.  Patient was to be discharged when she had rebleed. Aerosolized transexamic acid  Utilize, had poor uptake. Initially, a Merocel sponge was placed , with only moderate success.  A Rhino Rocket with balloon was placed 5.5 cm.  Patient has had  no further rebleeding.  Will start her on antibiotics and have her follow-up with Dr. Benjamine Mola   (including critical care time) Labs Review Labs Reviewed  PROTIME-INR - Abnormal; Notable for the following:    Prothrombin Time 26.0 (*)    INR 2.36 (*)    All other components within normal limits  BASIC METABOLIC PANEL - Abnormal; Notable for the following:    Glucose, Bld 152 (*)    GFR calc non Af Amer 81 (*)    All other components within normal limits  CBC WITH DIFFERENTIAL/PLATELET    Imaging Review No results found.   EKG Interpretation None     CRITICAL CARE Performed by: Kalman Drape Total critical care time: 30 min Critical care time was exclusive of separately billable procedures and treating other patients. Critical care was necessary to treat or prevent imminent or life-threatening deterioration. Critical care was time spent personally by me on the following activities: development of treatment plan with  patient and/or surrogate as well as nursing, discussions with consultants, evaluation of patient's response to treatment, examination of patient, obtaining history from patient or surrogate, ordering and performing treatments and interventions, ordering and review of laboratory studies, ordering and review of radiographic studies, pulse oximetry and re-evaluation of patient's condition.  MDM   Final diagnoses:  Anterior epistaxis     80 year old female with anterior epistaxis that is been difficult to control. At this time , packing is preventing further bleeding.  We'll start her on clindamycin to cover for superimposed infection.  Patient to contact Dr. Deeann Saint office in the morning for close follow-up.    Kalman Drape, MD 04/14/14 [phone]

## 2014-04-14 NOTE — Discharge Instructions (Signed)
Please contact Dr. Mick Sell office for follow-up this week.  Return to the emergency department for return of bleeding or other concerning symptoms.  Take antibiotics as prescribed.   Nosebleed Nosebleeds can be caused by many conditions, including trauma, infections, polyps, foreign bodies, dry mucous membranes or climate, medicines, and air conditioning. Most nosebleeds occur in the front of the nose. Because of this location, most nosebleeds can be controlled by pinching the nostrils gently and continuously for at least 10 to 20 minutes. The long, continuous pressure allows enough time for the blood to clot. If pressure is released during that 10 to 20 minute time period, the process may have to be started again. The nosebleed may stop by itself or quit with pressure, or it may need concentrated heating (cautery) or pressure from packing. HOME CARE INSTRUCTIONS   If your nose was packed, try to maintain the pack inside until your health care provider removes it. If a gauze pack was used and it starts to fall out, gently replace it or cut the end off. Do not cut if a balloon catheter was used to pack the nose. Otherwise, do not remove unless instructed.  Avoid blowing your nose for 12 hours after treatment. This could dislodge the pack or clot and start the bleeding again.  If the bleeding starts again, sit up and bend forward, gently pinching the front half of your nose continuously for 20 minutes.  If bleeding was caused by dry mucous membranes, use over-the-counter saline nasal spray or gel. This will keep the mucous membranes moist and allow them to heal. If you must use a lubricant, choose the water-soluble variety. Use it only sparingly and not within several hours of lying down.  Do not use petroleum jelly or mineral oil, as these may drip into the lungs and cause serious problems.  Maintain humidity in your home by using less air conditioning or by using a humidifier.  Do not use aspirin or  medicines which make bleeding more likely. Your health care provider can give you recommendations on this.  Resume normal activities as you are able, but try to avoid straining, lifting, or bending at the waist for several days.  If the nosebleeds become recurrent and the cause is unknown, your health care provider may suggest laboratory tests. SEEK MEDICAL CARE IF: You have a fever. SEEK IMMEDIATE MEDICAL CARE IF:   Bleeding recurs and cannot be controlled.  There is unusual bleeding from or bruising on other parts of the body.  Nosebleeds continue.  There is any worsening of the condition which originally brought you in.  You become light-headed, feel faint, become sweaty, or vomit blood. MAKE SURE YOU:   Understand these instructions.  Will watch your condition.  Will get help right away if you are not doing well or get worse. Document Released: 12/07/2004 Document Revised: 07/14/2013 Document Reviewed: 01/28/2009 [facility] Patient Information 2015 [place], [place]. This information is not intended to replace advice given to you by your health care provider. Make sure you discuss any questions you have with your health care provider.

## 2014-04-14 NOTE — ED Notes (Signed)
MD at bedside. (Dr. Sharol Given)

## 2014-04-14 NOTE — ED Notes (Signed)
Patients nose bleed is being controlled by the rapid rhino but spitting up blood with medium size clots. Provided pt hygiene with cleaning face with wet wash clothes.

## 2014-04-14 NOTE — ED Notes (Signed)
Patients nose bleed is still stable with less spitting up blood. Pt is aware of monitoring of nose bleed.

## 2014-04-15 DIAGNOSIS — R040 Epistaxis: Secondary | ICD-10-CM | POA: Diagnosis not present

## 2014-04-21 DIAGNOSIS — R040 Epistaxis: Secondary | ICD-10-CM | POA: Diagnosis not present

## 2014-04-29 DIAGNOSIS — Z6841 Body Mass Index (BMI) 40.0 and over, adult: Secondary | ICD-10-CM | POA: Diagnosis not present

## 2014-04-29 DIAGNOSIS — Z7901 Long term (current) use of anticoagulants: Secondary | ICD-10-CM | POA: Diagnosis not present

## 2014-04-29 DIAGNOSIS — R040 Epistaxis: Secondary | ICD-10-CM | POA: Diagnosis not present

## 2014-04-29 DIAGNOSIS — I480 Paroxysmal atrial fibrillation: Secondary | ICD-10-CM | POA: Diagnosis not present

## 2014-05-12 DIAGNOSIS — I480 Paroxysmal atrial fibrillation: Secondary | ICD-10-CM | POA: Diagnosis not present

## 2014-05-12 DIAGNOSIS — Z7901 Long term (current) use of anticoagulants: Secondary | ICD-10-CM | POA: Diagnosis not present

## 2014-05-26 DIAGNOSIS — Z7901 Long term (current) use of anticoagulants: Secondary | ICD-10-CM | POA: Diagnosis not present

## 2014-05-26 DIAGNOSIS — I480 Paroxysmal atrial fibrillation: Secondary | ICD-10-CM | POA: Diagnosis not present

## 2014-06-10 DIAGNOSIS — Z7901 Long term (current) use of anticoagulants: Secondary | ICD-10-CM | POA: Diagnosis not present

## 2014-06-10 DIAGNOSIS — I480 Paroxysmal atrial fibrillation: Secondary | ICD-10-CM | POA: Diagnosis not present

## 2014-06-16 DIAGNOSIS — D225 Melanocytic nevi of trunk: Secondary | ICD-10-CM | POA: Diagnosis not present

## 2014-06-16 DIAGNOSIS — L820 Inflamed seborrheic keratosis: Secondary | ICD-10-CM | POA: Diagnosis not present

## 2014-06-16 DIAGNOSIS — L219 Seborrheic dermatitis, unspecified: Secondary | ICD-10-CM | POA: Diagnosis not present

## 2014-06-23 DIAGNOSIS — I480 Paroxysmal atrial fibrillation: Secondary | ICD-10-CM | POA: Diagnosis not present

## 2014-06-23 DIAGNOSIS — Z7901 Long term (current) use of anticoagulants: Secondary | ICD-10-CM | POA: Diagnosis not present

## 2014-07-10 DIAGNOSIS — Z1212 Encounter for screening for malignant neoplasm of rectum: Secondary | ICD-10-CM | POA: Diagnosis not present

## 2014-07-28 DIAGNOSIS — I480 Paroxysmal atrial fibrillation: Secondary | ICD-10-CM | POA: Diagnosis not present

## 2014-07-28 DIAGNOSIS — Z7901 Long term (current) use of anticoagulants: Secondary | ICD-10-CM | POA: Diagnosis not present

## 2014-09-01 DIAGNOSIS — Z7901 Long term (current) use of anticoagulants: Secondary | ICD-10-CM | POA: Diagnosis not present

## 2014-09-01 DIAGNOSIS — I480 Paroxysmal atrial fibrillation: Secondary | ICD-10-CM | POA: Diagnosis not present

## 2014-09-08 ENCOUNTER — Other Ambulatory Visit: Payer: Self-pay

## 2014-09-08 DIAGNOSIS — Z1231 Encounter for screening mammogram for malignant neoplasm of breast: Secondary | ICD-10-CM

## 2014-09-17 ENCOUNTER — Ambulatory Visit
Admission: RE | Admit: 2014-09-17 | Discharge: 2014-09-17 | Disposition: A | Discharge status: Home / Self Care | Payer: Medicare Other | Source: Ambulatory Visit

## 2014-09-17 DIAGNOSIS — Z1231 Encounter for screening mammogram for malignant neoplasm of breast: Secondary | ICD-10-CM

## 2014-09-22 DIAGNOSIS — I480 Paroxysmal atrial fibrillation: Secondary | ICD-10-CM | POA: Diagnosis not present

## 2014-09-22 DIAGNOSIS — E538 Deficiency of other specified B group vitamins: Secondary | ICD-10-CM | POA: Diagnosis not present

## 2014-09-22 DIAGNOSIS — E785 Hyperlipidemia, unspecified: Secondary | ICD-10-CM | POA: Diagnosis not present

## 2014-09-22 DIAGNOSIS — R809 Proteinuria, unspecified: Secondary | ICD-10-CM | POA: Diagnosis not present

## 2014-09-22 DIAGNOSIS — Z6839 Body mass index (BMI) 39.0-39.9, adult: Secondary | ICD-10-CM | POA: Diagnosis not present

## 2014-09-22 DIAGNOSIS — Z7901 Long term (current) use of anticoagulants: Secondary | ICD-10-CM | POA: Diagnosis not present

## 2014-09-22 DIAGNOSIS — E11359 Type 2 diabetes mellitus with proliferative diabetic retinopathy without macular edema: Secondary | ICD-10-CM | POA: Diagnosis not present

## 2014-09-22 DIAGNOSIS — N182 Chronic kidney disease, stage 2 (mild): Secondary | ICD-10-CM | POA: Diagnosis not present

## 2014-09-22 DIAGNOSIS — E1129 Type 2 diabetes mellitus with other diabetic kidney complication: Secondary | ICD-10-CM | POA: Diagnosis not present

## 2014-09-22 DIAGNOSIS — I129 Hypertensive chronic kidney disease with stage 1 through stage 4 chronic kidney disease, or unspecified chronic kidney disease: Secondary | ICD-10-CM | POA: Diagnosis not present

## 2014-09-22 DIAGNOSIS — Z1389 Encounter for screening for other disorder: Secondary | ICD-10-CM | POA: Diagnosis not present

## 2014-10-22 DIAGNOSIS — H26493 Other secondary cataract, bilateral: Secondary | ICD-10-CM | POA: Diagnosis not present

## 2014-10-22 DIAGNOSIS — E119 Type 2 diabetes mellitus without complications: Secondary | ICD-10-CM | POA: Diagnosis not present

## 2014-10-22 DIAGNOSIS — H52203 Unspecified astigmatism, bilateral: Secondary | ICD-10-CM | POA: Diagnosis not present

## 2014-10-23 DIAGNOSIS — R350 Frequency of micturition: Secondary | ICD-10-CM | POA: Diagnosis not present

## 2014-10-23 DIAGNOSIS — N3941 Urge incontinence: Secondary | ICD-10-CM | POA: Diagnosis not present

## 2014-10-23 DIAGNOSIS — R351 Nocturia: Secondary | ICD-10-CM | POA: Diagnosis not present

## 2014-11-03 DIAGNOSIS — Z7901 Long term (current) use of anticoagulants: Secondary | ICD-10-CM | POA: Diagnosis not present

## 2014-11-03 DIAGNOSIS — I480 Paroxysmal atrial fibrillation: Secondary | ICD-10-CM | POA: Diagnosis not present

## 2014-11-04 DIAGNOSIS — R350 Frequency of micturition: Secondary | ICD-10-CM | POA: Diagnosis not present

## 2014-11-04 DIAGNOSIS — N3941 Urge incontinence: Secondary | ICD-10-CM | POA: Diagnosis not present

## 2014-11-04 DIAGNOSIS — R351 Nocturia: Secondary | ICD-10-CM | POA: Diagnosis not present

## 2014-11-04 DIAGNOSIS — N281 Cyst of kidney, acquired: Secondary | ICD-10-CM | POA: Diagnosis not present

## 2014-11-04 DIAGNOSIS — N3020 Other chronic cystitis without hematuria: Secondary | ICD-10-CM | POA: Diagnosis not present

## 2014-11-05 DIAGNOSIS — H26491 Other secondary cataract, right eye: Secondary | ICD-10-CM | POA: Diagnosis not present

## 2014-11-05 DIAGNOSIS — H2640 Unspecified secondary cataract: Secondary | ICD-10-CM | POA: Diagnosis not present

## 2014-12-03 DIAGNOSIS — I480 Paroxysmal atrial fibrillation: Secondary | ICD-10-CM | POA: Diagnosis not present

## 2014-12-03 DIAGNOSIS — Z7901 Long term (current) use of anticoagulants: Secondary | ICD-10-CM | POA: Diagnosis not present

## 2014-12-04 DIAGNOSIS — I480 Paroxysmal atrial fibrillation: Secondary | ICD-10-CM | POA: Diagnosis not present

## 2014-12-04 DIAGNOSIS — Z7901 Long term (current) use of anticoagulants: Secondary | ICD-10-CM | POA: Diagnosis not present

## 2014-12-07 DIAGNOSIS — Z7901 Long term (current) use of anticoagulants: Secondary | ICD-10-CM | POA: Diagnosis not present

## 2014-12-07 DIAGNOSIS — Z23 Encounter for immunization: Secondary | ICD-10-CM | POA: Diagnosis not present

## 2014-12-07 DIAGNOSIS — I480 Paroxysmal atrial fibrillation: Secondary | ICD-10-CM | POA: Diagnosis not present

## 2014-12-09 DIAGNOSIS — N3941 Urge incontinence: Secondary | ICD-10-CM | POA: Diagnosis not present

## 2014-12-09 DIAGNOSIS — N281 Cyst of kidney, acquired: Secondary | ICD-10-CM | POA: Diagnosis not present

## 2014-12-09 DIAGNOSIS — K7689 Other specified diseases of liver: Secondary | ICD-10-CM | POA: Diagnosis not present

## 2014-12-09 DIAGNOSIS — N2889 Other specified disorders of kidney and ureter: Secondary | ICD-10-CM | POA: Diagnosis not present

## 2014-12-09 DIAGNOSIS — N3020 Other chronic cystitis without hematuria: Secondary | ICD-10-CM | POA: Diagnosis not present

## 2014-12-24 DIAGNOSIS — N3281 Overactive bladder: Secondary | ICD-10-CM | POA: Diagnosis not present

## 2014-12-24 DIAGNOSIS — Z6841 Body Mass Index (BMI) 40.0 and over, adult: Secondary | ICD-10-CM | POA: Diagnosis not present

## 2014-12-24 DIAGNOSIS — E785 Hyperlipidemia, unspecified: Secondary | ICD-10-CM | POA: Diagnosis not present

## 2014-12-24 DIAGNOSIS — Z1389 Encounter for screening for other disorder: Secondary | ICD-10-CM | POA: Diagnosis not present

## 2014-12-24 DIAGNOSIS — E1129 Type 2 diabetes mellitus with other diabetic kidney complication: Secondary | ICD-10-CM | POA: Diagnosis not present

## 2014-12-24 DIAGNOSIS — R809 Proteinuria, unspecified: Secondary | ICD-10-CM | POA: Diagnosis not present

## 2014-12-24 DIAGNOSIS — I480 Paroxysmal atrial fibrillation: Secondary | ICD-10-CM | POA: Diagnosis not present

## 2014-12-24 DIAGNOSIS — Z7901 Long term (current) use of anticoagulants: Secondary | ICD-10-CM | POA: Diagnosis not present

## 2014-12-24 DIAGNOSIS — I129 Hypertensive chronic kidney disease with stage 1 through stage 4 chronic kidney disease, or unspecified chronic kidney disease: Secondary | ICD-10-CM | POA: Diagnosis not present

## 2014-12-24 DIAGNOSIS — N182 Chronic kidney disease, stage 2 (mild): Secondary | ICD-10-CM | POA: Diagnosis not present

## 2014-12-29 ENCOUNTER — Other Ambulatory Visit: Payer: Self-pay | Admitting: Gastroenterology

## 2014-12-29 DIAGNOSIS — D124 Benign neoplasm of descending colon: Secondary | ICD-10-CM | POA: Diagnosis not present

## 2014-12-29 DIAGNOSIS — K5730 Diverticulosis of large intestine without perforation or abscess without bleeding: Secondary | ICD-10-CM | POA: Diagnosis not present

## 2014-12-29 DIAGNOSIS — Z09 Encounter for follow-up examination after completed treatment for conditions other than malignant neoplasm: Secondary | ICD-10-CM | POA: Diagnosis not present

## 2014-12-29 DIAGNOSIS — K621 Rectal polyp: Secondary | ICD-10-CM | POA: Diagnosis not present

## 2014-12-29 DIAGNOSIS — Z86010 Personal history of colonic polyps: Secondary | ICD-10-CM | POA: Diagnosis not present

## 2014-12-29 DIAGNOSIS — D123 Benign neoplasm of transverse colon: Secondary | ICD-10-CM | POA: Diagnosis not present

## 2014-12-29 DIAGNOSIS — D126 Benign neoplasm of colon, unspecified: Secondary | ICD-10-CM | POA: Diagnosis not present

## 2015-01-05 DIAGNOSIS — E1129 Type 2 diabetes mellitus with other diabetic kidney complication: Secondary | ICD-10-CM | POA: Diagnosis not present

## 2015-01-05 DIAGNOSIS — Z7901 Long term (current) use of anticoagulants: Secondary | ICD-10-CM | POA: Diagnosis not present

## 2015-01-05 DIAGNOSIS — I480 Paroxysmal atrial fibrillation: Secondary | ICD-10-CM | POA: Diagnosis not present

## 2015-01-05 DIAGNOSIS — Z6838 Body mass index (BMI) 38.0-38.9, adult: Secondary | ICD-10-CM | POA: Diagnosis not present

## 2015-01-05 DIAGNOSIS — I129 Hypertensive chronic kidney disease with stage 1 through stage 4 chronic kidney disease, or unspecified chronic kidney disease: Secondary | ICD-10-CM | POA: Diagnosis not present

## 2015-01-05 DIAGNOSIS — N182 Chronic kidney disease, stage 2 (mild): Secondary | ICD-10-CM | POA: Diagnosis not present

## 2015-01-22 DIAGNOSIS — E784 Other hyperlipidemia: Secondary | ICD-10-CM | POA: Diagnosis not present

## 2015-01-22 DIAGNOSIS — N182 Chronic kidney disease, stage 2 (mild): Secondary | ICD-10-CM | POA: Diagnosis not present

## 2015-01-22 DIAGNOSIS — I480 Paroxysmal atrial fibrillation: Secondary | ICD-10-CM | POA: Diagnosis not present

## 2015-01-22 DIAGNOSIS — E1129 Type 2 diabetes mellitus with other diabetic kidney complication: Secondary | ICD-10-CM | POA: Diagnosis not present

## 2015-01-22 DIAGNOSIS — I129 Hypertensive chronic kidney disease with stage 1 through stage 4 chronic kidney disease, or unspecified chronic kidney disease: Secondary | ICD-10-CM | POA: Diagnosis not present

## 2015-01-22 DIAGNOSIS — I10 Essential (primary) hypertension: Secondary | ICD-10-CM | POA: Diagnosis not present

## 2015-01-22 DIAGNOSIS — Z7901 Long term (current) use of anticoagulants: Secondary | ICD-10-CM | POA: Diagnosis not present

## 2015-01-22 DIAGNOSIS — Z6841 Body Mass Index (BMI) 40.0 and over, adult: Secondary | ICD-10-CM | POA: Diagnosis not present

## 2015-01-22 DIAGNOSIS — R808 Other proteinuria: Secondary | ICD-10-CM | POA: Diagnosis not present

## 2015-01-22 DIAGNOSIS — E039 Hypothyroidism, unspecified: Secondary | ICD-10-CM | POA: Diagnosis not present

## 2015-01-25 DIAGNOSIS — N3020 Other chronic cystitis without hematuria: Secondary | ICD-10-CM | POA: Diagnosis not present

## 2015-01-25 DIAGNOSIS — N3941 Urge incontinence: Secondary | ICD-10-CM | POA: Diagnosis not present

## 2015-02-17 DIAGNOSIS — I480 Paroxysmal atrial fibrillation: Secondary | ICD-10-CM | POA: Diagnosis not present

## 2015-02-17 DIAGNOSIS — Z7901 Long term (current) use of anticoagulants: Secondary | ICD-10-CM | POA: Diagnosis not present

## 2015-03-02 DIAGNOSIS — N3020 Other chronic cystitis without hematuria: Secondary | ICD-10-CM | POA: Diagnosis not present

## 2015-03-02 DIAGNOSIS — N3941 Urge incontinence: Secondary | ICD-10-CM | POA: Diagnosis not present

## 2015-03-10 DIAGNOSIS — E1129 Type 2 diabetes mellitus with other diabetic kidney complication: Secondary | ICD-10-CM | POA: Diagnosis not present

## 2015-03-10 DIAGNOSIS — E538 Deficiency of other specified B group vitamins: Secondary | ICD-10-CM | POA: Diagnosis not present

## 2015-03-10 DIAGNOSIS — R8290 Unspecified abnormal findings in urine: Secondary | ICD-10-CM | POA: Diagnosis not present

## 2015-03-10 DIAGNOSIS — E784 Other hyperlipidemia: Secondary | ICD-10-CM | POA: Diagnosis not present

## 2015-03-10 DIAGNOSIS — E038 Other specified hypothyroidism: Secondary | ICD-10-CM | POA: Diagnosis not present

## 2015-03-17 DIAGNOSIS — E538 Deficiency of other specified B group vitamins: Secondary | ICD-10-CM | POA: Diagnosis not present

## 2015-03-17 DIAGNOSIS — Z7901 Long term (current) use of anticoagulants: Secondary | ICD-10-CM | POA: Diagnosis not present

## 2015-03-17 DIAGNOSIS — Z1389 Encounter for screening for other disorder: Secondary | ICD-10-CM | POA: Diagnosis not present

## 2015-03-17 DIAGNOSIS — E113599 Type 2 diabetes mellitus with proliferative diabetic retinopathy without macular edema, unspecified eye: Secondary | ICD-10-CM | POA: Diagnosis not present

## 2015-03-17 DIAGNOSIS — Z0000 Encounter for general adult medical examination without abnormal findings: Secondary | ICD-10-CM | POA: Diagnosis not present

## 2015-03-17 DIAGNOSIS — E1129 Type 2 diabetes mellitus with other diabetic kidney complication: Secondary | ICD-10-CM | POA: Diagnosis not present

## 2015-03-17 DIAGNOSIS — I480 Paroxysmal atrial fibrillation: Secondary | ICD-10-CM | POA: Diagnosis not present

## 2015-03-17 DIAGNOSIS — R7989 Other specified abnormal findings of blood chemistry: Secondary | ICD-10-CM | POA: Diagnosis not present

## 2015-03-17 DIAGNOSIS — I1310 Hypertensive heart and chronic kidney disease without heart failure, with stage 1 through stage 4 chronic kidney disease, or unspecified chronic kidney disease: Secondary | ICD-10-CM | POA: Diagnosis not present

## 2015-03-17 DIAGNOSIS — Z6839 Body mass index (BMI) 39.0-39.9, adult: Secondary | ICD-10-CM | POA: Diagnosis not present

## 2015-03-17 NOTE — Progress Notes (Signed)
Chief Complaint  Patient presents with  . Palpitations    History of Present Illness: 81 yo WF with history of DM, hypothyroidism, HTN, HLD, Vitamin B12 deficiency, iron deficiency here today for cardiac followup. I saw her in October 2012 as a new patient. She presented for her stress test as ordered by Dr. Osborne Casco and was found to be in atrial fibrillation with HR of 115. NO awareness of palpitations. No stress induced ischemia on nuclear study. Low risk study. Normal LVEF. I started Toprol XL and Pradaxa. Echo on 01/12/11 with normal LV size and function, LVEF of 55-60%, mild LVH, moderate TR, mild MR. She was cardioverted on 02/27/11. She stopped her Pradaxa and started coumadin per Dr. Odette Fraction. Her coumadin is being followed in his office.  She was seen here in August 2015 for planned f/u and was in atrial fib. HR was 100-110. I increased her Toprol to 50 mg per day.   She is here today for follow up. She is doing well. She has had no bleeding issue. No chest pain or SOB. No palpitations. She is exercising every day.   Primary Care Physician: Domenick Gong  Past Medical History  Diagnosis Date  . Hyperthyroidism   . Hyperlipidemia   . Hypertension   . H/O: hysterectomy   . Ketoacidosis, diabetic, no coma, non-insulin dependent   . Vitamin B12 deficiency   . History of colonoscopy 01/25/2010  . History of mammogram 08/04/2009  . Diabetes mellitus   . Atrial fibrillation [facility])     Past Surgical History  Procedure Laterality Date  . Bilateral foot surgery    . Abdominal hysterectomy    . Tubal ligation    . Rotator cuff repair    . Laparoscopic cholecystectomy    . Cardioversion  02/27/2011    Procedure: CARDIOVERSION;  Surgeon: Loralie Champagne, MD;  Location: [facility] OR;  Service: Cardiovascular;  Laterality: N/A;    Current Outpatient Prescriptions  Medication Sig Dispense Refill  . clindamycin (CLEOCIN) 300 MG capsule Take 1 capsule (300 mg total) by mouth 4 (four) times  daily. 40 capsule 0  . cyanocobalamin 100 MCG tablet Take 100 mcg by mouth daily.      . Dulaglutide (TRULICITY [place]) Inject into the skin once a week.    . ferrous sulfate 325 (65 FE) MG tablet Take 325 mg by mouth daily with breakfast.      . glimepiride (AMARYL) 2 MG tablet 1 tab daily    . INVOKANA 100 MG TABS tablet Take 100 mg by mouth daily.    Marland Kitchen levothyroxine (SYNTHROID, LEVOTHROID) 125 MCG tablet Take 125 mcg by mouth daily.      Marland Kitchen lisinopril (PRINIVIL,ZESTRIL) 40 MG tablet Take 40 mg by mouth daily.      Marland Kitchen losartan (COZAAR) 50 MG tablet Take 50 mg by mouth daily.    . metFORMIN (GLUCOPHAGE) 850 MG tablet Take 850 mg by mouth 2 (two) times daily with a meal.      . metoprolol succinate (TOPROL-XL) 50 MG 24 hr tablet Take 1 tablet (50 mg total) by mouth daily. 30 tablet 11  . Multiple Vitamins-Minerals (MULTIVITAMIN WITH MINERALS) tablet Take 1 tablet by mouth daily.      Marland Kitchen MYRBETRIQ 50 MG TB24 tablet Take 50 mg by mouth daily.  11  . rosuvastatin (CRESTOR) 10 MG tablet Take 5 mg by mouth daily.      . solifenacin (VESICARE) 5 MG tablet Take 5 mg by mouth daily.     Marland Kitchen  TOUJEO SOLOSTAR 300 UNIT/ML SOPN INJECT 12 UNITS UNDER THE SKIN DAILY AND INCREASE AS DIRECTED UP TO 20 UNITS DAILY  1  . trimethoprim (TRIMPEX) 100 MG tablet Take 100 mg by mouth daily.  9  . warfarin (COUMADIN) 5 MG tablet Take 5 mg by mouth daily.    . WARFARIN SODIUM PO Take by mouth as directed.     No current facility-administered medications for this visit.    Allergies  Allergen Reactions  . Penicillins     Patient stated that throat closes up, and she breaks out in hives.     Social History   Social History  . Marital Status: Married    Spouse Name: N/A  . Number of Children: 2  . Years of Education: N/A   Occupational History  .  Other    Worked at [employer] Topics  . Smoking status: Never Smoker   . Smokeless tobacco: Never Used  . Alcohol Use: No  . Drug Use: No  .  Sexual Activity: Not on file   Other Topics Concern  . Not on file   Social History Narrative   Patient since 77   Husband with prostate cancer   10-siblings-no cancer    Family History  Problem Relation Age of Onset  . Heart failure Father 48    enlarged heart  . Pneumonia Mother 70  . Diabetes Mother 64  . Cancer Neg Hx     Review of Systems:  As stated in the HPI and otherwise negative.   BP 136/92 mmHg  Pulse 97  Ht 5' 4.5" (1.638 m)  Wt 235 lb 1.9 oz (106.65 kg)  BMI 39.75 kg/m2  Physical Examination: General: Well developed, well nourished, NAD HEENT: OP clear, mucus membranes moist SKIN: warm, dry. No rashes. Neuro: No focal deficits Musculoskeletal: Muscle strength 5/5 all ext Psychiatric: Mood and affect normal Neck: No JVD, no carotid bruits, no thyromegaly, no lymphadenopathy. Lungs:Clear bilaterally, no wheezes, rhonci, crackles Cardiovascular: Regular rate and rhythm. No murmurs, gallops or rubs. Abdomen:Soft. Bowel sounds present. Non-tender.  Extremities: No lower extremity edema. Pulses are 2 + in the bilateral DP/PT.  EKG:  EKG is ordered today. The ekg ordered today demonstrates Atrial fib, rate 97 bpm.   Recent Labs: 04/14/2014: BUN 16; Creatinine, Ser 0.69; Hemoglobin 13.5; Platelets 233; Potassium 3.9; Sodium 138   Lipid Panel No results found for: CHOL, TRIG, HDL, CHOLHDL, VLDL, LDLCALC, LDLDIRECT   Wt Readings from Last 3 Encounters:  03/18/15 235 lb 1.9 oz (106.65 kg)  01/29/14 235 lb (106.595 kg)  10/21/13 235 lb 12.8 oz (106.958 kg)     Other studies Reviewed: Additional studies/ records that were reviewed today include: . Review of the above records demonstrates:    Assessment and Plan:   1. Atrial fibrillation:  Rate controlled. Continue Toprol XL 50 mg po Qdaily. Continue coumadin. No bleeding issues.   2. HTN: BP controlled. No changes  Current medicines are reviewed at length with the patient today.  The patient does  not have concerns regarding medicines.  The following changes have been made:  no change  Labs/ tests ordered today include:   Orders Placed This Encounter  Procedures  . EKG 12-Lead  . Echocardiogram    Disposition:   FU with me in 12  months  Signed, Lauree Chandler, MD 03/18/2015 9:47 AM    [facility] [address], [place], [place]  [address] Phone: [phone])  [phone]; Fax: [phone]    

## 2015-03-18 ENCOUNTER — Ambulatory Visit (INDEPENDENT_AMBULATORY_CARE_PROVIDER_SITE_OTHER): Payer: Medicare Other | Admitting: Cardiovascular Disease

## 2015-03-18 ENCOUNTER — Encounter: Payer: Self-pay | Admitting: Cardiovascular Disease

## 2015-03-18 VITALS — BP 136/92 | HR 97 | Ht 64.5 in | Wt 235.1 lb

## 2015-03-18 DIAGNOSIS — I4821 Permanent atrial fibrillation: Secondary | ICD-10-CM

## 2015-03-18 DIAGNOSIS — I482 Chronic atrial fibrillation: Secondary | ICD-10-CM | POA: Diagnosis not present

## 2015-03-18 MED ORDER — METOPROLOL SUCCINATE ER 50 MG PO TB24: 50.0000 mg | ORAL_TABLET | Freq: Every day | ORAL | Status: DC

## 2015-03-18 NOTE — Patient Instructions (Signed)
Medication Instructions:  Your physician recommends that you continue on your current medications as directed. Please refer to the Current Medication list given to you today.   Labwork: none  Testing/Procedures: Your physician has requested that you have an echocardiogram. Echocardiography is a painless test that uses sound waves to create images of your heart. It provides your doctor with information about the size and shape of your heart and how well your heart's chambers and valves are working. This procedure takes approximately one hour. There are no restrictions for this procedure. To be done in 12 months--week or so prior to appt with Dr. Angelena Form    Follow-Up: Your physician wants you to follow-up in: 12 months.  You will receive a reminder letter in the mail two months in advance. If you don't receive a letter, please call our office to schedule the follow-up appointment.   Any Other Special Instructions Will Be Listed Below (If Applicable).     If you need a refill on your cardiac medications before your next appointment, please call your pharmacy.

## 2015-04-14 DIAGNOSIS — Z7901 Long term (current) use of anticoagulants: Secondary | ICD-10-CM | POA: Diagnosis not present

## 2015-04-14 DIAGNOSIS — I480 Paroxysmal atrial fibrillation: Secondary | ICD-10-CM | POA: Diagnosis not present

## 2015-05-24 DIAGNOSIS — R350 Frequency of micturition: Secondary | ICD-10-CM | POA: Diagnosis not present

## 2015-05-24 DIAGNOSIS — N3941 Urge incontinence: Secondary | ICD-10-CM | POA: Diagnosis not present

## 2015-05-24 DIAGNOSIS — Z0000 Encounter for general adult medical examination without abnormal findings: Secondary | ICD-10-CM | POA: Diagnosis not present

## 2015-06-16 DIAGNOSIS — I480 Paroxysmal atrial fibrillation: Secondary | ICD-10-CM | POA: Diagnosis not present

## 2015-06-16 DIAGNOSIS — R808 Other proteinuria: Secondary | ICD-10-CM | POA: Diagnosis not present

## 2015-06-16 DIAGNOSIS — E113599 Type 2 diabetes mellitus with proliferative diabetic retinopathy without macular edema, unspecified eye: Secondary | ICD-10-CM | POA: Diagnosis not present

## 2015-06-16 DIAGNOSIS — N3281 Overactive bladder: Secondary | ICD-10-CM | POA: Diagnosis not present

## 2015-06-16 DIAGNOSIS — N183 Chronic kidney disease, stage 3 (moderate): Secondary | ICD-10-CM | POA: Diagnosis not present

## 2015-06-16 DIAGNOSIS — E538 Deficiency of other specified B group vitamins: Secondary | ICD-10-CM | POA: Diagnosis not present

## 2015-06-16 DIAGNOSIS — I10 Essential (primary) hypertension: Secondary | ICD-10-CM | POA: Diagnosis not present

## 2015-06-16 DIAGNOSIS — I129 Hypertensive chronic kidney disease with stage 1 through stage 4 chronic kidney disease, or unspecified chronic kidney disease: Secondary | ICD-10-CM | POA: Diagnosis not present

## 2015-06-16 DIAGNOSIS — Z7901 Long term (current) use of anticoagulants: Secondary | ICD-10-CM | POA: Diagnosis not present

## 2015-06-16 DIAGNOSIS — E1129 Type 2 diabetes mellitus with other diabetic kidney complication: Secondary | ICD-10-CM | POA: Diagnosis not present

## 2015-06-16 DIAGNOSIS — Z6839 Body mass index (BMI) 39.0-39.9, adult: Secondary | ICD-10-CM | POA: Diagnosis not present

## 2015-06-16 DIAGNOSIS — E038 Other specified hypothyroidism: Secondary | ICD-10-CM | POA: Diagnosis not present

## 2015-06-18 DIAGNOSIS — R350 Frequency of micturition: Secondary | ICD-10-CM | POA: Diagnosis not present

## 2015-06-18 DIAGNOSIS — R351 Nocturia: Secondary | ICD-10-CM | POA: Diagnosis not present

## 2015-06-18 DIAGNOSIS — Z0000 Encounter for general adult medical examination without abnormal findings: Secondary | ICD-10-CM | POA: Diagnosis not present

## 2015-06-18 DIAGNOSIS — N3941 Urge incontinence: Secondary | ICD-10-CM | POA: Diagnosis not present

## 2015-06-24 DIAGNOSIS — R350 Frequency of micturition: Secondary | ICD-10-CM | POA: Diagnosis not present

## 2015-06-24 DIAGNOSIS — N3020 Other chronic cystitis without hematuria: Secondary | ICD-10-CM | POA: Diagnosis not present

## 2015-07-20 ENCOUNTER — Encounter: Payer: Self-pay | Admitting: *Deleted

## 2015-07-20 DIAGNOSIS — M25562 Pain in left knee: Secondary | ICD-10-CM

## 2015-07-20 DIAGNOSIS — I480 Paroxysmal atrial fibrillation: Secondary | ICD-10-CM | POA: Diagnosis not present

## 2015-07-20 DIAGNOSIS — Z7901 Long term (current) use of anticoagulants: Secondary | ICD-10-CM | POA: Diagnosis not present

## 2015-07-20 DIAGNOSIS — R350 Frequency of micturition: Secondary | ICD-10-CM | POA: Diagnosis not present

## 2015-07-20 DIAGNOSIS — N3941 Urge incontinence: Secondary | ICD-10-CM | POA: Diagnosis not present

## 2015-07-20 NOTE — Congregational Nurse Program (Signed)
Congregational Nurse Program Note  Date of Encounter: 07/20/2015  Past Medical History: Past Medical History  Diagnosis Date  . Hyperthyroidism   . Hyperlipidemia   . Hypertension   . H/O: hysterectomy   . Ketoacidosis, diabetic, no coma, non-insulin dependent   . Vitamin B12 deficiency   . History of colonoscopy 01/25/2010  . History of mammogram 08/04/2009  . Diabetes mellitus   . Atrial fibrillation [facility])     Encounter Details:     CNP Questionnaire - 07/20/15 1824    Patient Demographics   Is this a new or existing patient? New   Patient is considered a/an Not Applicable   Race Caucasian/White   Patient Assistance   Location of Patient Assistance Not Applicable   Patient's financial/insurance status Medicare   Uninsured Patient No   Patient referred to apply for the following financial assistance Not Applicable   Food insecurities addressed Not Applicable   Transportation assistance No   Assistance securing medications No   Educational health offerings Other   Encounter Details   Primary purpose of visit Other   Was an Emergency Department visit averted? Not Applicable   Does patient have a medical provider? Yes   Patient referred to Doctor referral for a non-emergent behavioral health crisis   Was a mental health screening completed? (GAINS tool) No   Does patient have dental issues? No   Does patient have vision issues? No   Does your patient have an abnormal blood pressure today? No   Since previous encounter, have you referred patient for abnormal blood pressure that resulted in a new diagnosis or medication change? No   Does your patient have an abnormal blood glucose today? No   Since previous encounter, have you referred patient for abnormal blood glucose that resulted in a new diagnosis or medication change? No   Was there a life-saving intervention made? No

## 2015-07-29 DIAGNOSIS — M25552 Pain in left hip: Secondary | ICD-10-CM | POA: Diagnosis not present

## 2015-07-29 DIAGNOSIS — M1612 Unilateral primary osteoarthritis, left hip: Secondary | ICD-10-CM | POA: Diagnosis not present

## 2015-07-29 DIAGNOSIS — S76212A Strain of adductor muscle, fascia and tendon of left thigh, initial encounter: Secondary | ICD-10-CM | POA: Diagnosis not present

## 2015-07-29 DIAGNOSIS — M7062 Trochanteric bursitis, left hip: Secondary | ICD-10-CM | POA: Diagnosis not present

## 2015-08-02 ENCOUNTER — Telehealth: Payer: Self-pay | Admitting: *Deleted

## 2015-08-02 DIAGNOSIS — Z0000 Encounter for general adult medical examination without abnormal findings: Secondary | ICD-10-CM | POA: Diagnosis not present

## 2015-08-02 DIAGNOSIS — R350 Frequency of micturition: Secondary | ICD-10-CM | POA: Diagnosis not present

## 2015-08-02 DIAGNOSIS — N3941 Urge incontinence: Secondary | ICD-10-CM | POA: Diagnosis not present

## 2015-08-02 NOTE — Telephone Encounter (Signed)
[id number]/tcf-patient/went to see md about back and leg pain,  Was given cortisone injection and pain meds.  States that she is feeling better and may be able to do physical therapy in the near future./Summer Hawkins Otho, [place], CCM, CN.

## 2015-08-13 DIAGNOSIS — S76212D Strain of adductor muscle, fascia and tendon of left thigh, subsequent encounter: Secondary | ICD-10-CM | POA: Diagnosis not present

## 2015-08-23 DIAGNOSIS — R1032 Left lower quadrant pain: Secondary | ICD-10-CM | POA: Diagnosis not present

## 2015-08-23 DIAGNOSIS — S76212A Strain of adductor muscle, fascia and tendon of left thigh, initial encounter: Secondary | ICD-10-CM | POA: Diagnosis not present

## 2015-08-23 DIAGNOSIS — M1612 Unilateral primary osteoarthritis, left hip: Secondary | ICD-10-CM | POA: Diagnosis not present

## 2015-08-23 DIAGNOSIS — M7062 Trochanteric bursitis, left hip: Secondary | ICD-10-CM | POA: Diagnosis not present

## 2015-08-24 DIAGNOSIS — Z7901 Long term (current) use of anticoagulants: Secondary | ICD-10-CM | POA: Diagnosis not present

## 2015-08-24 DIAGNOSIS — I480 Paroxysmal atrial fibrillation: Secondary | ICD-10-CM | POA: Diagnosis not present

## 2015-09-01 ENCOUNTER — Encounter: Payer: Self-pay | Admitting: *Deleted

## 2015-09-01 DIAGNOSIS — R32 Unspecified urinary incontinence: Secondary | ICD-10-CM

## 2015-09-01 DIAGNOSIS — N3642 Intrinsic sphincter deficiency (ISD): Secondary | ICD-10-CM

## 2015-09-01 DIAGNOSIS — R351 Nocturia: Secondary | ICD-10-CM | POA: Diagnosis not present

## 2015-09-01 DIAGNOSIS — N3941 Urge incontinence: Secondary | ICD-10-CM | POA: Diagnosis not present

## 2015-09-01 NOTE — Congregational Nurse Program (Signed)
Congregational Nurse Program Note  Date of Encounter: 09/01/2015  Past Medical History: Past Medical History  Diagnosis Date  . Hyperthyroidism   . Hyperlipidemia   . Hypertension   . H/O: hysterectomy   . Ketoacidosis, diabetic, no coma, non-insulin dependent   . Vitamin B12 deficiency   . History of colonoscopy 01/25/2010  . History of mammogram 08/04/2009  . Diabetes mellitus   . Atrial fibrillation [facility])     Encounter Details:     CNP Questionnaire - 09/01/15 1735    Patient Demographics   Is this a new or existing patient? Existing   Patient is considered a/an Not Applicable   Race Caucasian/White   Patient Assistance   Location of Patient Assistance Not Applicable   Uninsured Patient No   Patient referred to apply for the following financial assistance Not Applicable   Food insecurities addressed Not Applicable   Transportation assistance No   Assistance securing medications No   Educational health offerings Not Applicable   Encounter Details   Primary purpose of visit Other   Was an Emergency Department visit averted? Not Applicable   Does patient have a medical provider? Yes   Patient referred to Not Applicable   Was a mental health screening completed? (GAINS tool) No   Does patient have dental issues? No   Does patient have vision issues? No   Does your patient have an abnormal blood pressure today? No   Since previous encounter, have you referred patient for abnormal blood pressure that resulted in a new diagnosis or medication change? No   Does your patient have an abnormal blood glucose today? No   Since previous encounter, have you referred patient for abnormal blood glucose that resulted in a new diagnosis or medication change? No   Was there a life-saving intervention made? No       See the previous note

## 2015-09-17 DIAGNOSIS — N3941 Urge incontinence: Secondary | ICD-10-CM | POA: Diagnosis not present

## 2015-09-17 DIAGNOSIS — R350 Frequency of micturition: Secondary | ICD-10-CM | POA: Diagnosis not present

## 2015-09-23 DIAGNOSIS — I480 Paroxysmal atrial fibrillation: Secondary | ICD-10-CM | POA: Diagnosis not present

## 2015-09-23 DIAGNOSIS — Z7901 Long term (current) use of anticoagulants: Secondary | ICD-10-CM | POA: Diagnosis not present

## 2015-10-05 DIAGNOSIS — E538 Deficiency of other specified B group vitamins: Secondary | ICD-10-CM | POA: Diagnosis not present

## 2015-10-05 DIAGNOSIS — I10 Essential (primary) hypertension: Secondary | ICD-10-CM | POA: Diagnosis not present

## 2015-10-05 DIAGNOSIS — N183 Chronic kidney disease, stage 3 (moderate): Secondary | ICD-10-CM | POA: Diagnosis not present

## 2015-10-05 DIAGNOSIS — E113599 Type 2 diabetes mellitus with proliferative diabetic retinopathy without macular edema, unspecified eye: Secondary | ICD-10-CM | POA: Diagnosis not present

## 2015-10-05 DIAGNOSIS — R808 Other proteinuria: Secondary | ICD-10-CM | POA: Diagnosis not present

## 2015-10-05 DIAGNOSIS — E039 Hypothyroidism, unspecified: Secondary | ICD-10-CM | POA: Diagnosis not present

## 2015-10-05 DIAGNOSIS — I480 Paroxysmal atrial fibrillation: Secondary | ICD-10-CM | POA: Diagnosis not present

## 2015-10-05 DIAGNOSIS — Z6841 Body Mass Index (BMI) 40.0 and over, adult: Secondary | ICD-10-CM | POA: Diagnosis not present

## 2015-10-05 DIAGNOSIS — E7800 Pure hypercholesterolemia, unspecified: Secondary | ICD-10-CM | POA: Diagnosis not present

## 2015-10-05 DIAGNOSIS — E038 Other specified hypothyroidism: Secondary | ICD-10-CM | POA: Diagnosis not present

## 2015-10-05 DIAGNOSIS — I129 Hypertensive chronic kidney disease with stage 1 through stage 4 chronic kidney disease, or unspecified chronic kidney disease: Secondary | ICD-10-CM | POA: Diagnosis not present

## 2015-10-05 DIAGNOSIS — N3281 Overactive bladder: Secondary | ICD-10-CM | POA: Diagnosis not present

## 2015-10-06 DIAGNOSIS — Z7901 Long term (current) use of anticoagulants: Secondary | ICD-10-CM | POA: Diagnosis not present

## 2015-10-25 DIAGNOSIS — E113293 Type 2 diabetes mellitus with mild nonproliferative diabetic retinopathy without macular edema, bilateral: Secondary | ICD-10-CM | POA: Diagnosis not present

## 2015-10-25 DIAGNOSIS — H353131 Nonexudative age-related macular degeneration, bilateral, early dry stage: Secondary | ICD-10-CM | POA: Diagnosis not present

## 2015-10-25 DIAGNOSIS — Z0100 Encounter for examination of eyes and vision without abnormal findings: Secondary | ICD-10-CM | POA: Diagnosis not present

## 2015-10-25 DIAGNOSIS — H04123 Dry eye syndrome of bilateral lacrimal glands: Secondary | ICD-10-CM | POA: Diagnosis not present

## 2015-11-04 DIAGNOSIS — Z7901 Long term (current) use of anticoagulants: Secondary | ICD-10-CM | POA: Diagnosis not present

## 2015-11-04 DIAGNOSIS — I480 Paroxysmal atrial fibrillation: Secondary | ICD-10-CM | POA: Diagnosis not present

## 2015-11-10 ENCOUNTER — Other Ambulatory Visit: Payer: Self-pay | Admitting: Internal Medicine

## 2015-11-10 DIAGNOSIS — Z1231 Encounter for screening mammogram for malignant neoplasm of breast: Secondary | ICD-10-CM

## 2015-11-22 ENCOUNTER — Ambulatory Visit: Payer: Medicare Other

## 2015-11-23 DIAGNOSIS — I480 Paroxysmal atrial fibrillation: Secondary | ICD-10-CM | POA: Diagnosis not present

## 2015-11-23 DIAGNOSIS — Z7901 Long term (current) use of anticoagulants: Secondary | ICD-10-CM | POA: Diagnosis not present

## 2015-11-29 ENCOUNTER — Ambulatory Visit
Admission: RE | Admit: 2015-11-29 | Discharge: 2015-11-29 | Disposition: A | Discharge status: Home / Self Care | Payer: Medicare Other | Source: Ambulatory Visit | Attending: Internal Medicine | Admitting: Internal Medicine

## 2015-11-29 DIAGNOSIS — Z1231 Encounter for screening mammogram for malignant neoplasm of breast: Secondary | ICD-10-CM

## 2015-12-02 ENCOUNTER — Other Ambulatory Visit: Payer: Self-pay | Admitting: Internal Medicine

## 2015-12-02 DIAGNOSIS — R928 Other abnormal and inconclusive findings on diagnostic imaging of breast: Secondary | ICD-10-CM

## 2015-12-08 NOTE — Patient Instructions (Signed)
Follow with pcp

## 2015-12-09 ENCOUNTER — Other Ambulatory Visit: Payer: Medicare Other

## 2015-12-10 ENCOUNTER — Ambulatory Visit
Admission: RE | Admit: 2015-12-10 | Discharge: 2015-12-10 | Disposition: A | Discharge status: Home / Self Care | Payer: Medicare Other | Source: Ambulatory Visit | Attending: Internal Medicine | Admitting: Internal Medicine

## 2015-12-10 ENCOUNTER — Other Ambulatory Visit: Payer: Self-pay | Admitting: Internal Medicine

## 2015-12-10 DIAGNOSIS — IMO0002 Reserved for concepts with insufficient information to code with codable children: Secondary | ICD-10-CM

## 2015-12-10 DIAGNOSIS — N6489 Other specified disorders of breast: Secondary | ICD-10-CM | POA: Diagnosis not present

## 2015-12-10 DIAGNOSIS — R928 Other abnormal and inconclusive findings on diagnostic imaging of breast: Secondary | ICD-10-CM

## 2015-12-10 DIAGNOSIS — R921 Mammographic calcification found on diagnostic imaging of breast: Secondary | ICD-10-CM | POA: Diagnosis not present

## 2015-12-10 DIAGNOSIS — R229 Localized swelling, mass and lump, unspecified: Principal | ICD-10-CM

## 2015-12-12 HISTORY — PX: BREAST EXCISIONAL BIOPSY: SUR124

## 2015-12-14 ENCOUNTER — Other Ambulatory Visit: Payer: Self-pay | Admitting: Internal Medicine

## 2015-12-14 DIAGNOSIS — N6320 Unspecified lump in the left breast, unspecified quadrant: Secondary | ICD-10-CM

## 2015-12-14 DIAGNOSIS — R229 Localized swelling, mass and lump, unspecified: Principal | ICD-10-CM

## 2015-12-14 DIAGNOSIS — IMO0002 Reserved for concepts with insufficient information to code with codable children: Secondary | ICD-10-CM

## 2015-12-15 ENCOUNTER — Ambulatory Visit
Admission: RE | Admit: 2015-12-15 | Discharge: 2015-12-15 | Disposition: A | Discharge status: Home / Self Care | Payer: Medicare Other | Source: Ambulatory Visit | Attending: Internal Medicine | Admitting: Internal Medicine

## 2015-12-15 ENCOUNTER — Other Ambulatory Visit: Payer: Self-pay | Admitting: Internal Medicine

## 2015-12-15 DIAGNOSIS — IMO0002 Reserved for concepts with insufficient information to code with codable children: Secondary | ICD-10-CM

## 2015-12-15 DIAGNOSIS — R928 Other abnormal and inconclusive findings on diagnostic imaging of breast: Secondary | ICD-10-CM | POA: Diagnosis not present

## 2015-12-15 DIAGNOSIS — N6489 Other specified disorders of breast: Secondary | ICD-10-CM

## 2015-12-15 DIAGNOSIS — C50412 Malignant neoplasm of upper-outer quadrant of left female breast: Secondary | ICD-10-CM | POA: Diagnosis not present

## 2015-12-15 DIAGNOSIS — R229 Localized swelling, mass and lump, unspecified: Principal | ICD-10-CM

## 2015-12-15 DIAGNOSIS — N6320 Unspecified lump in the left breast, unspecified quadrant: Secondary | ICD-10-CM

## 2015-12-15 DIAGNOSIS — R921 Mammographic calcification found on diagnostic imaging of breast: Secondary | ICD-10-CM

## 2015-12-22 ENCOUNTER — Encounter: Payer: Self-pay | Admitting: Genetic Counselor

## 2015-12-24 ENCOUNTER — Ambulatory Visit: Payer: Self-pay | Admitting: General Surgery

## 2015-12-24 DIAGNOSIS — C50412 Malignant neoplasm of upper-outer quadrant of left female breast: Secondary | ICD-10-CM | POA: Diagnosis not present

## 2015-12-24 DIAGNOSIS — Z170 Estrogen receptor positive status [ER+]: Secondary | ICD-10-CM

## 2015-12-27 ENCOUNTER — Encounter: Payer: Self-pay | Admitting: Cardiovascular Disease

## 2015-12-27 ENCOUNTER — Telehealth: Payer: Self-pay | Admitting: *Deleted

## 2015-12-27 NOTE — Telephone Encounter (Signed)
I placed call to pt and left message to call back.

## 2015-12-27 NOTE — Telephone Encounter (Signed)
Letter has been written. Can we make sure she is having no chest pain or SOB? Thanks, chris

## 2015-12-27 NOTE — Telephone Encounter (Signed)
I spoke with pt and she is not having any chest pain or shortness of breath. I told her Dr. Angelena Form would clear her from a cardiac standpoint to have surgery.

## 2015-12-27 NOTE — Telephone Encounter (Signed)
Request for surgical clearance received in office from Dr. Chase Caller [facility]. Per note plan is for a left breast radioactive seed localized lumpectomy and sentinel node mapping.  Need to obtain cardiac clearance prior to scheduling surgery as she is on coumadin for atrial fibrillation.  If OK to proceed with a note indicating cardiac clearance is requested.

## 2015-12-28 DIAGNOSIS — Z7901 Long term (current) use of anticoagulants: Secondary | ICD-10-CM | POA: Diagnosis not present

## 2015-12-28 DIAGNOSIS — I480 Paroxysmal atrial fibrillation: Secondary | ICD-10-CM | POA: Diagnosis not present

## 2015-12-28 DIAGNOSIS — Z23 Encounter for immunization: Secondary | ICD-10-CM | POA: Diagnosis not present

## 2016-01-04 ENCOUNTER — Telehealth: Payer: Self-pay | Admitting: Hematology and Oncology

## 2016-01-04 NOTE — Congregational Nurse Program (Signed)
01/04/2015 note to close case. [id number] note to close case

## 2016-01-04 NOTE — Telephone Encounter (Signed)
Appt scheduled w/Summer Hawkins on 10/26 @345pm . Pt agreed. Demographics verified. Pt aware to arrive 30 minutes early.

## 2016-01-04 NOTE — Progress Notes (Signed)
Location of Breast Cancer: left breast cancer  Histology per Pathology Report:   12/15/15 Diagnosis Breast, left, needle core biopsy, outer breast - INVASIVE DUCTAL CARCINOMA WITH CALCIFICATIONS. - DUCTAL CARCINOMA IN SITU. - SEE COMMENT.  Receptor Status: ER(100%), PR (90%), Her2-neu (neg), Ki-(5%)  Did patient present with symptoms (if so, please note symptoms) or was this found on screening mammography?: screening mammogram  Past/Anticipated interventions by surgeon, if any: plans for lumpectomy and sentinel lymph node biopsy  Past/Anticipated interventions by medical oncology, if any: will see Dr. Lindi Adie tomorrow.  Lymphedema issues, if any: no  Pain issues, if any:  no   OB Gyn history: patient was 18 with her first menses.  Patient has 2 children.  She was 22 at birth of first child.  She used birth control pills for 15 years.  She used hormone replacement for 15 years.  She has a sister with breast cancer.  SAFETY ISSUES:  Prior radiation? no  Pacemaker/ICD? no  Possible current pregnancy?no  Is the patient on methotrexate? no  Current Complaints / other details:    BP (!) 171/73 (BP Location: Right Arm, Patient Position: Sitting)   Pulse 81   Temp 98.9 F (37.2 C) (Oral)   Ht 5' 4.5" (1.638 m)   Wt 238 lb 6.4 oz (108.1 kg)   LMP  (LMP Unknown)   SpO2 97%   BMI 40.29 kg/m    Wt Readings from Last 3 Encounters:  01/05/16 238 lb 6.4 oz (108.1 kg)  03/18/15 235 lb 1.9 oz (106.6 kg)  01/29/14 235 lb (106.6 kg)      Hess, Craige Cotta, RN 01/04/2016,2:45 PM

## 2016-01-05 ENCOUNTER — Encounter: Payer: Self-pay | Admitting: Radiation Oncology

## 2016-01-05 ENCOUNTER — Ambulatory Visit
Admission: RE | Admit: 2016-01-05 | Discharge: 2016-01-05 | Disposition: A | Discharge status: Home / Self Care | Payer: Medicare Other | Source: Ambulatory Visit | Attending: Radiation Oncology | Admitting: Radiation Oncology

## 2016-01-05 ENCOUNTER — Other Ambulatory Visit: Payer: Self-pay | Admitting: *Deleted

## 2016-01-05 DIAGNOSIS — C50412 Malignant neoplasm of upper-outer quadrant of left female breast: Secondary | ICD-10-CM | POA: Diagnosis not present

## 2016-01-05 DIAGNOSIS — Z803 Family history of malignant neoplasm of breast: Secondary | ICD-10-CM | POA: Diagnosis not present

## 2016-01-05 DIAGNOSIS — E785 Hyperlipidemia, unspecified: Secondary | ICD-10-CM | POA: Diagnosis not present

## 2016-01-05 DIAGNOSIS — E0590 Thyrotoxicosis, unspecified without thyrotoxic crisis or storm: Secondary | ICD-10-CM | POA: Diagnosis not present

## 2016-01-05 DIAGNOSIS — I4891 Unspecified atrial fibrillation: Secondary | ICD-10-CM | POA: Diagnosis not present

## 2016-01-05 DIAGNOSIS — Z79899 Other long term (current) drug therapy: Secondary | ICD-10-CM | POA: Diagnosis not present

## 2016-01-05 DIAGNOSIS — Z8249 Family history of ischemic heart disease and other diseases of the circulatory system: Secondary | ICD-10-CM | POA: Insufficient documentation

## 2016-01-05 DIAGNOSIS — Z170 Estrogen receptor positive status [ER+]: Secondary | ICD-10-CM

## 2016-01-05 DIAGNOSIS — Z880 Allergy status to penicillin: Secondary | ICD-10-CM | POA: Insufficient documentation

## 2016-01-05 DIAGNOSIS — Z833 Family history of diabetes mellitus: Secondary | ICD-10-CM | POA: Diagnosis not present

## 2016-01-05 DIAGNOSIS — Z7901 Long term (current) use of anticoagulants: Secondary | ICD-10-CM | POA: Diagnosis not present

## 2016-01-05 DIAGNOSIS — E119 Type 2 diabetes mellitus without complications: Secondary | ICD-10-CM | POA: Insufficient documentation

## 2016-01-05 DIAGNOSIS — Z90710 Acquired absence of both cervix and uterus: Secondary | ICD-10-CM | POA: Insufficient documentation

## 2016-01-05 DIAGNOSIS — I10 Essential (primary) hypertension: Secondary | ICD-10-CM | POA: Diagnosis not present

## 2016-01-05 DIAGNOSIS — Z7984 Long term (current) use of oral hypoglycemic drugs: Secondary | ICD-10-CM | POA: Insufficient documentation

## 2016-01-05 DIAGNOSIS — C50011 Malignant neoplasm of nipple and areola, right female breast: Secondary | ICD-10-CM

## 2016-01-05 HISTORY — DX: Malignant neoplasm of unspecified site of left female breast: C50.912

## 2016-01-05 NOTE — Progress Notes (Signed)
Radiation Oncology         [phone] ________________________________  Initial Outpatient Consultation  Name: Summer Hawkins MRN: [id number]  Date: 01/05/2016  DOB: Jan 05, 1935  WU:[id number] Sandrea Hughs, MD  Jovita Kussmaul, MD   REFERRING PHYSICIAN: Autumn Messing III, MD  DIAGNOSIS: The encounter diagnosis was Malignant neoplasm of upper-outer quadrant of left breast in female, estrogen receptor positive ([place]).   Clinical grade 1-2, T1c invasive ductal carcinoma and DCIS of the left breast (ER 100% +, PR 90% +, HER2 -, Ki67 5%)  HISTORY OF PRESENT ILLNESS::Summer Hawkins is a 81 y.o. female who had a screening mammogram on 11/29/15. This noted a possible mass and asymmetry in the left breast.  Diagnostic mammogram of the left breast on 12/10/15 revealed an opacity in the upper outer left breast spanning 1.9 x 1.0 x 1.1 cm. There were also associated mildly pleomorphic calcifications. On physical exam, no mass was palpated in the upper outer left breast. Targeted ultrasound showed a vague area of hypo echogenicity in the 1:00 position, but no convincing mass. Ultrasound of the left axilla was negative for adenopathy.  Needle core biopsy of the outer left breast on 12/15/15 revealed grade 1-2 invasive ductal carcinoma with calcifications and DCIS (ER 100% +, PR 90% +, HER2 -, Ki67 5%).  The patient presents today to discuss the role of radiation for the management of her disease.  PREVIOUS RADIATION THERAPY: No  PAST MEDICAL HISTORY:  has a past medical history of Atrial fibrillation ([place]); Breast cancer, left ([place]); Diabetes mellitus; H/O: hysterectomy; History of colonoscopy (01/25/2010); History of mammogram (08/04/2009); Hyperlipidemia; Hypertension; Hyperthyroidism; Ketoacidosis, diabetic, no coma, non-insulin dependent; and Vitamin B12 deficiency.    PAST SURGICAL HISTORY: Past Surgical History:  Procedure Laterality Date  . ABDOMINAL HYSTERECTOMY    . Bilateral foot surgery    .  CARDIOVERSION  02/27/2011   Procedure: CARDIOVERSION;  Surgeon: Loralie Champagne, MD;  Location: [facility];  Service: Cardiovascular;  Laterality: N/A;  . LAPAROSCOPIC CHOLECYSTECTOMY    . ROTATOR CUFF REPAIR    . TUBAL LIGATION      FAMILY HISTORY: family history includes Breast cancer in her sister; Diabetes (age of onset: 32) in her mother; Heart failure (age of onset: 103) in her father; Pneumonia (age of onset: 3) in her mother.  SOCIAL HISTORY:  reports that she has never smoked. She has never used smokeless tobacco. She reports that she does not drink alcohol or use drugs.  ALLERGIES: Penicillins  MEDICATIONS:  Current Outpatient Prescriptions  Medication Sig Dispense Refill  . cyanocobalamin 100 MCG tablet Take 100 mcg by mouth daily.      . ferrous sulfate 325 (65 FE) MG tablet Take 325 mg by mouth daily with breakfast.      . glimepiride (AMARYL) 2 MG tablet 1 tab daily    . levothyroxine (SYNTHROID, LEVOTHROID) 125 MCG tablet Take 125 mcg by mouth daily.      Marland Kitchen losartan (COZAAR) 50 MG tablet Take 50 mg by mouth daily.    . metFORMIN (GLUCOPHAGE) 850 MG tablet Take 850 mg by mouth 2 (two) times daily with a meal.      . metoprolol succinate (TOPROL-XL) 50 MG 24 hr tablet Take 1 tablet (50 mg total) by mouth daily. 30 tablet 11  . Multiple Vitamins-Minerals (MULTIVITAMIN WITH MINERALS) tablet Take 1 tablet by mouth daily.      Marland Kitchen MYRBETRIQ 50 MG TB24 tablet Take 50 mg by mouth daily.  11  . rosuvastatin (  CRESTOR) 10 MG tablet Take 5 mg by mouth daily.      . TOUJEO SOLOSTAR 300 UNIT/ML SOPN INJECT 12 UNITS UNDER THE SKIN DAILY AND INCREASE AS DIRECTED UP TO 20 UNITS DAILY  1  . trimethoprim (TRIMPEX) 100 MG tablet Take 100 mg by mouth daily.  9  . warfarin (COUMADIN) 5 MG tablet Take 5 mg by mouth daily.    . WARFARIN SODIUM PO Take by mouth as directed.     No current facility-administered medications for this encounter.     REVIEW OF SYSTEMS:  A 15 point review of systems is  documented in the electronic medical record. This was obtained by the nursing staff. However, I reviewed this with the patient to discuss relevant findings and make appropriate changes.  Pertinent items noted in HPI and remainder of comprehensive ROS otherwise negative.   PHYSICAL EXAM:  height is 5' 4.5" (1.638 m) and weight is 238 lb 6.4 oz (108.1 kg). Her oral temperature is 98.9 F (37.2 C). Her blood pressure is 171/73 (abnormal) and her pulse is 81. Her oxygen saturation is 97%.   General: Alert and oriented, in no acute distress HEENT: Head is normocephalic. Extraocular movements are intact. Oropharynx is clear. Neck: Neck is supple, no palpable cervical or supraclavicular lymphadenopathy. Heart: She had an irregular rhythm consistent with A-fib. Chest: Clear to auscultation bilaterally, with no rhonchi, wheezes, or rales. Abdomen: Soft, nontender, nondistended, with no rigidity or guarding. Extremities: No cyanosis or edema. Lymphatics: see Neck Exam. No axillary adenopathy. Skin: No concerning lesions. Musculoskeletal: symmetric strength and muscle tone throughout. Neurologic: Cranial nerves II through XII are grossly intact. No obvious focalities. Speech is fluent. Coordination is intact. Psychiatric: Judgment and insight are intact. Affect is appropriate. Breast Exam: Right breast no palpable mass or nipple discharge. Left breast small biopsy site in the UOQ, no palpable mass or nipple discharge.  ECOG = 1  LABORATORY DATA:  Lab Results  Component Value Date   WBC 9.1 04/14/2014   HGB 13.5 04/14/2014   HCT 42.0 04/14/2014   MCV 94.0 04/14/2014   PLT 233 04/14/2014   NEUTROABS 4.6 04/14/2014   Lab Results  Component Value Date   NA 138 04/14/2014   K 3.9 04/14/2014   CL 106 04/14/2014   CO2 25 04/14/2014   GLUCOSE 152 (H) 04/14/2014   CREATININE 0.69 04/14/2014   CALCIUM 8.8 04/14/2014      RADIOGRAPHY: US Breast Ltd Uni Left Inc Axilla  Result Date:  12/10/2015 CLINICAL DATA:  Screening recall for a possible mass in the left breast. EXAM: 2D DIGITAL DIAGNOSTIC LEFT MAMMOGRAM WITH CAD AND ADJUNCT TOMO ULTRASOUND LEFT BREAST COMPARISON:  Previous exam(s). ACR Breast Density Category b: There are scattered areas of fibroglandular density. FINDINGS: The possible left breast mass appears is a focal irregular opacity in the upper outer left breast on the diagnostic 2D and 3D images. There are associated mildly pleomorphic calcifications. The area of opacity spans approximately 19 x 10 x 11 mm. There are no other areas of significant asymmetry. There are no discrete masses. No other suspicious calcifications. Mammographic images were processed with CAD. On physical exam, no mass is palpated in the upper outer left breast. Targeted ultrasound is performed, showing a vague area of hypo echogenicity in the 1 o'clock position, but no convincing mass. Ultrasound of the left axilla shows no enlarged or abnormal lymph nodes. IMPRESSION: Suspicious focal opacity with associated calcifications in the upper outer left breast. RECOMMENDATION: Stereotactic  core needle biopsy. I have discussed the findings and recommendations with the patient. Results were also provided in writing at the conclusion of the visit. If applicable, a reminder letter will be sent to the patient regarding the next appointment. BI-RADS CATEGORY  4: Suspicious. Electronically Signed   By: Lajean Manes M.D.   On: 12/10/2015 09:07   Mm Diag Breast Tomo Uni Left  Result Date: 12/15/2015 CLINICAL DATA:  Post stereotactic core needle biopsy of left breast upper outer quadrant focal asymmetry. EXAM: DIAGNOSTIC LEFT MAMMOGRAM POST STEREOTACTIC BIOPSY COMPARISON:  Previous exam(s). FINDINGS: Mammographic images were obtained following stereotactic guided biopsy of left breast upper outer quadrant focal asymmetry. Two-view mammography demonstrates presence of coil shaped marker within the biopsy site in the  left breast upper outer quadrant. Expected post biopsy changes are seen. IMPRESSION: Successful placement of coil shaped marker within left breast upper outer quadrant biopsy site. Final Assessment: Post Procedure Mammograms for Marker Placement Electronically Signed   By: Fidela Salisbury M.D.   On: 12/15/2015 10:29   Mm Diag Breast Tomo Uni Left  Result Date: 12/10/2015 CLINICAL DATA:  Screening recall for a possible mass in the left breast. EXAM: 2D DIGITAL DIAGNOSTIC LEFT MAMMOGRAM WITH CAD AND ADJUNCT TOMO ULTRASOUND LEFT BREAST COMPARISON:  Previous exam(s). ACR Breast Density Category b: There are scattered areas of fibroglandular density. FINDINGS: The possible left breast mass appears is a focal irregular opacity in the upper outer left breast on the diagnostic 2D and 3D images. There are associated mildly pleomorphic calcifications. The area of opacity spans approximately 19 x 10 x 11 mm. There are no other areas of significant asymmetry. There are no discrete masses. No other suspicious calcifications. Mammographic images were processed with CAD. On physical exam, no mass is palpated in the upper outer left breast. Targeted ultrasound is performed, showing a vague area of hypo echogenicity in the 1 o'clock position, but no convincing mass. Ultrasound of the left axilla shows no enlarged or abnormal lymph nodes. IMPRESSION: Suspicious focal opacity with associated calcifications in the upper outer left breast. RECOMMENDATION: Stereotactic core needle biopsy. I have discussed the findings and recommendations with the patient. Results were also provided in writing at the conclusion of the visit. If applicable, a reminder letter will be sent to the patient regarding the next appointment. BI-RADS CATEGORY  4: Suspicious. Electronically Signed   By: Lajean Manes M.D.   On: 12/10/2015 09:07   Mm Lt Breast Bx W Loc Dev 1st Lesion Image Bx Spec Stereo Guide  Addendum Date: 12/16/2015   ADDENDUM REPORT:  12/16/2015 13:57 ADDENDUM: Pathology revealed GRADE I-II INVASIVE DUCTAL CARCINOMA WITH CALCIFICATIONS, DUCTAL CARCINOMA IN SITU of the outer Left breast. This was found to be concordant by Dr. Fidela Salisbury. Pathology results were discussed with the patient by telephone. The patient reported doing well after the biopsy with tenderness at the site. Post biopsy instructions and care were reviewed and questions were answered. The patient was encouraged to call [facility] for any additional concerns. Surgical consultation has been arranged with Dr. Autumn Messing at [facility] on December 24, 2015. Pathology results reported by Terie Purser, RN on 12/16/2015. Electronically Signed   By: Fidela Salisbury M.D.   On: 12/16/2015 13:57   Result Date: 12/16/2015 CLINICAL DATA:  Left breast upper outer quadrant focal asymmetry. EXAM: LEFT BREAST STEREOTACTIC CORE NEEDLE BIOPSY COMPARISON:  Previous exams. FINDINGS: The patient and I discussed the procedure of stereotactic-guided biopsy  including benefits and alternatives. We discussed the high likelihood of a successful procedure. We discussed the risks of the procedure including infection, bleeding, tissue injury, clip migration, and inadequate sampling. Informed written consent was given. The usual time out protocol was performed immediately prior to the procedure. Using sterile technique and 1% Lidocaine as local anesthetic, under stereotactic guidance, a 9 gauge vacuum device was used to perform core needle biopsy of focal asymmetry in the left breast upper outer quadrant using a superior approach. Specimen radiograph was not performed. At the conclusion of the procedure, a coil shaped tissue marker clip was deployed into the biopsy cavity. Follow-up 2-view mammogram was performed and dictated separately. IMPRESSION: Stereotactic-guided biopsy of left breast focal asymmetry. No apparent complications. Electronically Signed:  By: Fidela Salisbury M.D. On: 12/15/2015 10:27      IMPRESSION: Clinical grade 1-2 invasive ductal carcinoma and DCIS of the left breast (ER 100% +, PR 90% +, HER2 -, Ki67 5%)  The patient may be a candidate for radiation therapy depending on the results on her surgery. If her margins are clear, she has a small tumor size, negative lymph nodes, and agrees to take an AI, then she may not need radiation therapy.  I spoke to the patient today regarding her diagnosis and options for treatment. We discussed the equivalence in terms of survival and local failure between mastectomy and breast conservation. We discussed the role of radiation in decreasing local failures in patients who undergo lumpectomy. We discussed the process of simulation and the placement tattoos. We discussed 4-6 weeks of treatment as an outpatient. We discussed the possibility of asymptomatic lung damage. We discussed the low likelihood of secondary malignancies. We discussed the possible side effects including but not limited to skin redness, fatigue, permanent skin darkening, and breast swelling.  We discussed the use of cardiac sparing with deep inspiration breath hold if needed.  PLAN: The patient is scheduled to consult with Dr. Lindi Adie on 01/06/16. The patient will be scheduled for surgery at a later date. She will return to radiation oncology approximately 2 weeks after surgery to discuss radiation therapy.     ------------------------------------------------  Blair Promise, PhD, MD  This document serves as a record of services personally performed by Gery Pray, MD. It was created on his behalf by Darcus Austin, a trained medical scribe. The creation of this record is based on the scribe's personal observations and the provider's statements to them. This document has been checked and approved by the attending provider.

## 2016-01-06 ENCOUNTER — Ambulatory Visit (HOSPITAL_BASED_OUTPATIENT_CLINIC_OR_DEPARTMENT_OTHER): Payer: Medicare Other | Admitting: Hematology and Oncology

## 2016-01-06 ENCOUNTER — Encounter: Payer: Self-pay | Admitting: Hematology and Oncology

## 2016-01-06 ENCOUNTER — Other Ambulatory Visit: Payer: Self-pay | Admitting: General Surgery

## 2016-01-06 ENCOUNTER — Other Ambulatory Visit (HOSPITAL_BASED_OUTPATIENT_CLINIC_OR_DEPARTMENT_OTHER): Payer: Medicare Other

## 2016-01-06 DIAGNOSIS — C50412 Malignant neoplasm of upper-outer quadrant of left female breast: Secondary | ICD-10-CM

## 2016-01-06 DIAGNOSIS — Z170 Estrogen receptor positive status [ER+]: Secondary | ICD-10-CM | POA: Diagnosis not present

## 2016-01-06 DIAGNOSIS — C50011 Malignant neoplasm of nipple and areola, right female breast: Secondary | ICD-10-CM

## 2016-01-06 LAB — CBC WITH DIFFERENTIAL/PLATELET
BASO%: 0.5 % (ref 0.0–2.0)
Basophils Absolute: 0.1 10*3/uL (ref 0.0–0.1)
EOS ABS: 0.7 10*3/uL — AB (ref 0.0–0.5)
EOS%: 6.8 % (ref 0.0–7.0)
HEMATOCRIT: 41.9 % (ref 34.8–46.6)
HEMOGLOBIN: 13.9 g/dL (ref 11.6–15.9)
LYMPH#: 3.1 10*3/uL (ref 0.9–3.3)
LYMPH%: 28.2 % (ref 14.0–49.7)
MCH: 30.9 pg (ref 25.1–34.0)
MCHC: 33.2 g/dL (ref 31.5–36.0)
MCV: 93.1 fL (ref 79.5–101.0)
MONO#: 0.9 10*3/uL (ref 0.1–0.9)
MONO%: 8.1 % (ref 0.0–14.0)
NEUT%: 56.4 % (ref 38.4–76.8)
NEUTROS ABS: 6.2 10*3/uL (ref 1.5–6.5)
PLATELETS: 246 10*3/uL (ref 145–400)
RBC: 4.5 10*6/uL (ref 3.70–5.45)
RDW: 12.7 % (ref 11.2–14.5)
WBC: 11 10*3/uL — AB (ref 3.9–10.3)

## 2016-01-06 LAB — COMPREHENSIVE METABOLIC PANEL
ALBUMIN: 3.6 g/dL (ref 3.5–5.0)
ALK PHOS: 103 U/L (ref 40–150)
ALT: 26 U/L (ref 0–55)
ANION GAP: 10 meq/L (ref 3–11)
AST: 21 U/L (ref 5–34)
BILIRUBIN TOTAL: 0.5 mg/dL (ref 0.20–1.20)
BUN: 16.8 mg/dL (ref 7.0–26.0)
CALCIUM: 9.1 mg/dL (ref 8.4–10.4)
CO2: 24 meq/L (ref 22–29)
CREATININE: 0.9 mg/dL (ref 0.6–1.1)
Chloride: 104 mEq/L (ref 98–109)
EGFR: 59 mL/min/{1.73_m2} — AB (ref >90)
Glucose: 234 mg/dl — ABNORMAL HIGH (ref 70–140)
Potassium: 4.2 mEq/L (ref 3.5–5.1)
Sodium: 138 mEq/L (ref 136–145)
TOTAL PROTEIN: 7.3 g/dL (ref 6.4–8.3)

## 2016-01-06 NOTE — Addendum Note (Signed)
Encounter addended by: Jacqulyn Liner, RN on: 01/06/2016  9:02 AM<BR>    Actions taken: Charge Capture section accepted

## 2016-01-06 NOTE — Assessment & Plan Note (Signed)
12/10/2015: Left breast biopsy: Grade 1-2 IDC with calcifications with DCIS, ER 100%, PR 90%, Ki-67 5%, HER-2 negative ratio 1.45; left breast mass 1.9 x 1 x 1.1 cm; T1 CN 0 stage IA clinical stage  Pathology and radiology counseling:Discussed with the patient, the details of pathology including the type of breast cancer,the clinical staging, the significance of ER, PR and HER-2/neu receptors and the implications for treatment. After reviewing the pathology in detail, we proceeded to discuss the different treatment options between surgery, radiation, chemotherapy, antiestrogen therapies.  Recommendations: 1. Breast conserving surgery followed by 2. Oncotype DX testing to determine if chemotherapy would be of any benefit followed by 3. Adjuvant radiation therapy followed by 4. Adjuvant antiestrogen therapy  Oncotype counseling: I discussed Oncotype DX test. I explained to the patient that this is a 21 gene panel to evaluate patient tumors DNA to calculate recurrence score. This would help determine whether patient has high risk or intermediate risk or low risk breast cancer. She understands that if her tumor was found to be high risk, she would benefit from systemic chemotherapy. If low risk, no need of chemotherapy. If she was found to be intermediate risk, we would need to evaluate the score as well as other risk factors and determine if an abbreviated chemotherapy may be of benefit.  Return to clinic after surgery to discuss final pathology report and then determine if Oncotype DX testing will need to be sent.

## 2016-01-06 NOTE — Progress Notes (Signed)
[facility] NOTE  Patient Care Team: Haywood Pao, MD as PCP - General (Internal Medicine)  CHIEF COMPLAINTS/PURPOSE OF CONSULTATION:  Newly diagnosed breast cancer  HISTORY OF PRESENTING ILLNESS:  Summer Hawkins 81 y.o. female is here because of recent diagnosis of left breast cancer. Patient had a routine screening mammogram the detected abnormality in the left breast which by ultrasound measured 1.9 cm. She underwent ultrasound guided biopsy which revealed invasive ductal carcinoma with DCIS that was ER/PR positive HER-2 negative with a Ki-67 of 5%. She was seen by Dr. Marlou Starks who is planning to do a lumpectomy. She also saw radiation oncology yesterday. She is here by herself today. Her husband has severe arthritis and could not come.  I reviewed her records extensively and collaborated the history with the patient.  SUMMARY OF ONCOLOGIC HISTORY:   Breast cancer of upper-outer quadrant of left female breast ([place])   12/15/2015 Initial Diagnosis    Left breast biopsy: Grade 1-2 IDC with calcifications with DCIS, ER 100%, PR 90%, Ki-67 5%, HER-2 negative ratio 1.45; left breast mass 1.9 x 1 x 1.1 cm; T1 CN 0 stage IA clinical stage      MEDICAL HISTORY:  Past Medical History:  Diagnosis Date  . Atrial fibrillation ([place])   . Breast cancer, left ([place])   . Diabetes mellitus   . H/O: hysterectomy   . History of colonoscopy 01/25/2010  . History of mammogram 08/04/2009  . Hyperlipidemia   . Hypertension   . Hyperthyroidism   . Ketoacidosis, diabetic, no coma, non-insulin dependent   . Vitamin B12 deficiency     SURGICAL HISTORY: Past Surgical History:  Procedure Laterality Date  . ABDOMINAL HYSTERECTOMY    . Bilateral foot surgery    . CARDIOVERSION  02/27/2011   Procedure: CARDIOVERSION;  Surgeon: Loralie Champagne, MD;  Location: [facility];  Service: Cardiovascular;  Laterality: N/A;  . LAPAROSCOPIC CHOLECYSTECTOMY    . ROTATOR CUFF REPAIR    . TUBAL LIGATION       SOCIAL HISTORY: Social History   Social History  . Marital status: Married    Spouse name: N/A  . Number of children: 2  . Years of education: N/A   Occupational History  .  Other    Worked at [employer] Topics  . Smoking status: Never Smoker  . Smokeless tobacco: Never Used  . Alcohol use No  . Drug use: No  . Sexual activity: Not on file   Other Topics Concern  . Not on file   Social History Narrative   Patient since 61   Husband with prostate cancer   10-siblings-no cancer    FAMILY HISTORY: Family History  Problem Relation Age of Onset  . Heart failure Father 40    enlarged heart  . Pneumonia Mother 19  . Diabetes Mother 96  . Breast cancer Sister   . Cancer Neg Hx     ALLERGIES:  is allergic to penicillins.  MEDICATIONS:  Current Outpatient Prescriptions  Medication Sig Dispense Refill  . cyanocobalamin 100 MCG tablet Take 100 mcg by mouth daily.      . ferrous sulfate 325 (65 FE) MG tablet Take 325 mg by mouth daily with breakfast.      . glimepiride (AMARYL) 2 MG tablet 1 tab daily    . levothyroxine (SYNTHROID, LEVOTHROID) 125 MCG tablet Take 125 mcg by mouth daily.      Marland Kitchen losartan (COZAAR) 50 MG tablet Take  50 mg by mouth daily.    . metFORMIN (GLUCOPHAGE) 850 MG tablet Take 850 mg by mouth 2 (two) times daily with a meal.      . metoprolol succinate (TOPROL-XL) 50 MG 24 hr tablet Take 1 tablet (50 mg total) by mouth daily. 30 tablet 11  . Multiple Vitamins-Minerals (MULTIVITAMIN WITH MINERALS) tablet Take 1 tablet by mouth daily.      Marland Kitchen MYRBETRIQ 50 MG TB24 tablet Take 50 mg by mouth daily.  11  . rosuvastatin (CRESTOR) 10 MG tablet Take 5 mg by mouth daily.      . TOUJEO SOLOSTAR 300 UNIT/ML SOPN INJECT 12 UNITS UNDER THE SKIN DAILY AND INCREASE AS DIRECTED UP TO 20 UNITS DAILY  1  . trimethoprim (TRIMPEX) 100 MG tablet Take 100 mg by mouth daily.  9  . warfarin (COUMADIN) 5 MG tablet Take 5 mg by mouth daily.    .  WARFARIN SODIUM PO Take by mouth as directed.     No current facility-administered medications for this visit.     REVIEW OF SYSTEMS:   Constitutional: Denies fevers, chills or abnormal night sweats Eyes: Denies blurriness of vision, double vision or watery eyes Ears, nose, mouth, throat, and face: Denies mucositis or sore throat Respiratory: Denies cough, dyspnea or wheezes Cardiovascular: Atrial fibrillation on Coumadin Gastrointestinal:  Denies nausea, heartburn or change in bowel habits Skin: Denies abnormal skin rashes Lymphatics: Denies new lymphadenopathy or easy bruising Neurological:Denies numbness, tingling or new weaknesses Behavioral/Psych: Mood is stable, no new changes  Breast: Occasional mastitis with bloody nipple discharge All other systems were reviewed with the patient and are negative.  PHYSICAL EXAMINATION: ECOG PERFORMANCE STATUS: 1 - Symptomatic but completely ambulatory  Vitals:   01/06/16 1552  BP: (!) 166/83  Pulse: 87  Resp: 20  Temp: 98.9 F (37.2 C)   Filed Weights   01/06/16 1552  Weight: 240 lb 1.6 oz (108.9 kg)    GENERAL:alert, no distress and comfortable SKIN: skin color, texture, turgor are normal, no rashes or significant lesions EYES: normal, conjunctiva are pink and non-injected, sclera clear OROPHARYNX:no exudate, no erythema and lips, buccal mucosa, and tongue normal  NECK: supple, thyroid normal size, non-tender, without nodularity LYMPH:  no palpable lymphadenopathy in the cervical, axillary or inguinal LUNGS: clear to auscultation and percussion with normal breathing effort HEART: regular rate & rhythm and no murmurs and no lower extremity edema ABDOMEN:abdomen soft, non-tender and normal bowel sounds Musculoskeletal:no cyanosis of digits and no clubbing  PSYCH: alert & oriented x 3 with fluent speech NEURO: no focal motor/sensory deficits BREAST: No palpable nodules in breast. No palpable axillary or supraclavicular  lymphadenopathy (exam performed in the presence of a chaperone)   LABORATORY DATA:  I have reviewed the data as listed Lab Results  Component Value Date   WBC 11.0 (H) 01/06/2016   HGB 13.9 01/06/2016   HCT 41.9 01/06/2016   MCV 93.1 01/06/2016   PLT 246 01/06/2016   Lab Results  Component Value Date   NA 138 01/06/2016   K 4.2 01/06/2016   CL 106 04/14/2014   CO2 24 01/06/2016    RADIOGRAPHIC STUDIES: I have personally reviewed the radiological reports and agreed with the findings in the report.  ASSESSMENT AND PLAN:  Breast cancer of upper-outer quadrant of left female breast ([place]) 12/10/2015: Left breast biopsy: Grade 1-2 IDC with calcifications with DCIS, ER 100%, PR 90%, Ki-67 5%, HER-2 negative ratio 1.45; left breast mass 1.9 x 1 x  1.1 cm; T1 CN 0 stage IA clinical stage  Pathology and radiology counseling:Discussed with the patient, the details of pathology including the type of breast cancer,the clinical staging, the significance of ER, PR and HER-2/neu receptors and the implications for treatment. After reviewing the pathology in detail, we proceeded to discuss the different treatment options between surgery, radiation, chemotherapy, antiestrogen therapies.  Recommendations: 1. Breast conserving surgery followed by 2. Oncotype DX testing to determine if chemotherapy would be of any benefit followed by 3. Adjuvant radiation therapy followed by 4. Adjuvant antiestrogen therapy  Oncotype counseling: I discussed Oncotype DX test. I explained to the patient that this is a 21 gene panel to evaluate patient tumors DNA to calculate recurrence score. This would help determine whether patient has high risk or intermediate risk or low risk breast cancer. She understands that if her tumor was found to be high risk, she would benefit from systemic chemotherapy. If low risk, no need of chemotherapy. If she was found to be intermediate risk, we would need to evaluate the score as well  as other risk factors and determine if an abbreviated chemotherapy may be of benefit.  Return to clinic after surgery to discuss final pathology report and then determine if Oncotype DX testing will need to be sent.     All questions were answered. The patient knows to call the clinic with any problems, questions or concerns.    Rulon Eisenmenger, MD 01/06/16

## 2016-01-07 ENCOUNTER — Telehealth: Payer: Self-pay | Admitting: *Deleted

## 2016-01-07 DIAGNOSIS — E1129 Type 2 diabetes mellitus with other diabetic kidney complication: Secondary | ICD-10-CM | POA: Diagnosis not present

## 2016-01-07 DIAGNOSIS — E538 Deficiency of other specified B group vitamins: Secondary | ICD-10-CM | POA: Diagnosis not present

## 2016-01-07 DIAGNOSIS — E113599 Type 2 diabetes mellitus with proliferative diabetic retinopathy without macular edema, unspecified eye: Secondary | ICD-10-CM | POA: Diagnosis not present

## 2016-01-07 DIAGNOSIS — C50919 Malignant neoplasm of unspecified site of unspecified female breast: Secondary | ICD-10-CM | POA: Diagnosis not present

## 2016-01-07 DIAGNOSIS — Z7901 Long term (current) use of anticoagulants: Secondary | ICD-10-CM | POA: Diagnosis not present

## 2016-01-07 DIAGNOSIS — N183 Chronic kidney disease, stage 3 (moderate): Secondary | ICD-10-CM | POA: Diagnosis not present

## 2016-01-07 DIAGNOSIS — I480 Paroxysmal atrial fibrillation: Secondary | ICD-10-CM | POA: Diagnosis not present

## 2016-01-07 DIAGNOSIS — E7800 Pure hypercholesterolemia, unspecified: Secondary | ICD-10-CM | POA: Diagnosis not present

## 2016-01-07 DIAGNOSIS — I10 Essential (primary) hypertension: Secondary | ICD-10-CM | POA: Diagnosis not present

## 2016-01-07 DIAGNOSIS — E038 Other specified hypothyroidism: Secondary | ICD-10-CM | POA: Diagnosis not present

## 2016-01-07 DIAGNOSIS — Z6839 Body mass index (BMI) 39.0-39.9, adult: Secondary | ICD-10-CM | POA: Diagnosis not present

## 2016-01-07 DIAGNOSIS — I129 Hypertensive chronic kidney disease with stage 1 through stage 4 chronic kidney disease, or unspecified chronic kidney disease: Secondary | ICD-10-CM | POA: Diagnosis not present

## 2016-01-07 NOTE — Telephone Encounter (Signed)
  Oncology Nurse Navigator Documentation  Navigator Location: [facility]- (01/07/16 1000) Referral date to RadOnc/MedOnc: 01/04/16 (01/07/16 1000) )Navigator Encounter Type: Initial MedOnc (01/07/16 1000)   Abnormal Finding Date: 11/29/15 (01/07/16 1000) Confirmed Diagnosis Date: 12/15/15 (01/07/16 1000)               Patient Visit Type: MedOnc;Initial (01/07/16 1000) Treatment Phase: Pre-Tx/Tx Discussion (01/07/16 1000)                  Acuity: Level 2 (01/07/16 1000)   Acuity Level 2: Initial guidance, education and coordination as needed;Educational needs;Assistance expediting appointments;Referrals such as genetics, survivorship;Ongoing guidance and education throughout treatment as needed (01/07/16 1000)     Time Spent with Patient: 15 (01/07/16 1000)

## 2016-01-13 ENCOUNTER — Encounter (HOSPITAL_COMMUNITY): Payer: Self-pay

## 2016-01-13 ENCOUNTER — Encounter (HOSPITAL_COMMUNITY)
Admission: RE | Admit: 2016-01-13 | Discharge: 2016-01-13 | Disposition: A | Discharge status: Home / Self Care | Payer: Medicare Other | Source: Ambulatory Visit | Attending: General Surgery | Admitting: General Surgery

## 2016-01-13 ENCOUNTER — Telehealth: Payer: Self-pay | Admitting: Hematology and Oncology

## 2016-01-13 DIAGNOSIS — E119 Type 2 diabetes mellitus without complications: Secondary | ICD-10-CM | POA: Insufficient documentation

## 2016-01-13 DIAGNOSIS — Z01812 Encounter for preprocedural laboratory examination: Secondary | ICD-10-CM | POA: Diagnosis not present

## 2016-01-13 HISTORY — DX: Dyspnea, unspecified: R06.00

## 2016-01-13 HISTORY — DX: Chronic kidney disease, unspecified: N18.9

## 2016-01-13 HISTORY — DX: Hypothyroidism, unspecified: E03.9

## 2016-01-13 HISTORY — DX: Cardiac arrhythmia, unspecified: I49.9

## 2016-01-13 LAB — GLUCOSE, CAPILLARY: Glucose-Capillary: 186 mg/dL — ABNORMAL HIGH (ref 65–99)

## 2016-01-13 NOTE — Progress Notes (Addendum)
  How to Manage Your Diabetes Before and After Surgery  Why is it important to control my blood sugar before and after surgery? . Improving blood sugar levels before and after surgery helps healing and can limit problems. . A way of improving blood sugar control is eating a healthy diet by: o  Eating less sugar and carbohydrates o  Increasing activity/exercise o  Talking with your doctor about reaching your blood sugar goals . High blood sugars (greater than 180 mg/dL) can raise your risk of infections and slow your recovery, so you will need to focus on controlling your diabetes during the weeks before surgery. . Make sure that the doctor who takes care of your diabetes knows about your planned surgery including the date and location.  How do I manage my blood sugar before surgery? . Check your blood sugar at least 4 times a day, starting 2 days before surgery, to make sure that the level is not too high or low. o Check your blood sugar the morning of your surgery when you wake up and every 2 hours until you get to the Short Stay unit. . If your blood sugar is less than 70 mg/dL, you will need to treat for low blood sugar: o Do not take insulin. o Treat a low blood sugar (less than 70 mg/dL) with  cup of clear juice (cranberry or apple), 4 glucose tablets, OR glucose gel. o Recheck blood sugar in 15 minutes after treatment (to make sure it is greater than 70 mg/dL). If your blood sugar is not greater than 70 mg/dL on recheck, call [phone] for further instructions. . Report your blood sugar to the short stay nurse when you get to Short Stay.  . If you are admitted to the hospital after surgery: o Your blood sugar will be checked by the staff and you will probably be given insulin after surgery (instead of oral diabetes medicines) to make sure you have good blood sugar levels. o The goal for blood sugar control after surgery is 80-180 mg/dL.   WHAT DO I DO ABOUT MY DIABETES MEDICATION  ? DO Not take Gylimepiride the evening prior to surgery . Do not take .  oral diabetes medicines (pills) the morning of surgery.        . THE MORNING OF SURGERY, take ______8_______ units of ___Toujeo_______insulin.    Patient Signature:  Date:   Nurse Signature:  Date:   Reviewed and Endorsed by [facility] Patient Education Committee, August 2015

## 2016-01-13 NOTE — Telephone Encounter (Signed)
lvm to inform pt of 11/27 appt date/time per LOS °

## 2016-01-13 NOTE — Pre-Procedure Instructions (Addendum)
    Codey Livingston  01/13/2016    Your procedure is scheduled on Wednesday, November 8.  Report to [facility] Admitting at 6:30 AM               Your surgery or procedure is scheduled for 8:30 AM   Call this number if you have problems the morning of surgery:[phone]                 For any other questions, please call [phone], Monday - Friday 8 AM - 4 PM.   Remember:  Do not eat food or drink liquids after midnight Tuesday, November 7 .  Take these medicines the morning of surgery with A SIP OF WATER:levothyroxinemetoprolol succinate (TOPROL-XL), .                  Do NOT take Glimepride the evening before surgery                DO NOT take Metformin the morning of Surgery.                 1 Week prior to surgery STOP taking Aspirin , Aspirin Products (Goody Powder, Excedrin Migraine), Ibuprofen (Advil), Naproxen (Aleve), Vitamins and Herbal Products (ie Fish Oil)                   Stop Coumadin as Instructed by Dr Marlou Starks   Do not wear jewelry, make-up or nail polish.  Do not wear lotions, powders, or perfumes, or deodorant.  Do not shave 48 hours prior to surgery.    Do not bring valuables to the hospital.  [facility] is not responsible for any belongings or valuables.  Contacts, dentures or bridgework may not be worn into surgery.  Leave your suitcase in the car.  After surgery it may be brought to your room.  For patients admitted to the hospital, discharge time will be determined by your treatment team.  Patients discharged the day of surgery will not be allowed to drive home.   Special instructions:  Review  [facility] - Preparing For Surgery, How to Manage you Diabetes Before and After Surgery Please read over the following fact sheets that you were given:  [facility]- Preparing For Surgery, Coughing and Deep Breathing, Pain Booklet

## 2016-01-17 ENCOUNTER — Ambulatory Visit
Admission: RE | Admit: 2016-01-17 | Discharge: 2016-01-17 | Disposition: A | Discharge status: Home / Self Care | Payer: Medicare Other | Source: Ambulatory Visit | Attending: General Surgery | Admitting: General Surgery

## 2016-01-17 DIAGNOSIS — C50412 Malignant neoplasm of upper-outer quadrant of left female breast: Secondary | ICD-10-CM

## 2016-01-17 DIAGNOSIS — R928 Other abnormal and inconclusive findings on diagnostic imaging of breast: Secondary | ICD-10-CM | POA: Diagnosis not present

## 2016-01-17 DIAGNOSIS — Z170 Estrogen receptor positive status [ER+]: Secondary | ICD-10-CM

## 2016-01-18 MED ORDER — VANCOMYCIN HCL 10 G IV SOLR
1500.0000 mg | INTRAVENOUS | Status: AC
  Administered 2016-01-19: 1500 mg via INTRAVENOUS
  Filled 2016-01-18: qty 1500

## 2016-01-18 NOTE — Anesthesia Preprocedure Evaluation (Addendum)
Anesthesia Evaluation  Patient identified by MRN, date of birth, ID band Patient awake    Reviewed: Allergy & Precautions, H&P , NPO status , Patient's Chart, lab work & pertinent test results  History of Anesthesia Complications Negative for: history of anesthetic complications  Airway Mallampati: III  TM Distance: >3 FB Neck ROM: Full    Dental  (+) Teeth Intact, Dental Advisory Given   Pulmonary    Pulmonary exam normal        Cardiovascular hypertension, Pt. on home beta blockers Normal cardiovascular exam+ dysrhythmias Atrial Fibrillation      Neuro/Psych negative neurological ROS  negative psych ROS   GI/Hepatic negative GI ROS, Neg liver ROS,   Endo/Other  diabetes, Type 2, Oral Hypoglycemic AgentsHyperthyroidism (medicated)   Renal/GU Renal disease     Musculoskeletal   Abdominal   Peds  Hematology   Anesthesia Other Findings   Reproductive/Obstetrics                           Anesthesia Physical  Anesthesia Plan  ASA: III  Anesthesia Plan: General   Post-op Pain Management:    Induction: Intravenous  Airway Management Planned: LMA  Additional Equipment:   Intra-op Plan:   Post-operative Plan: Extubation in OR  Informed Consent: I have reviewed the patients History and Physical, chart, labs and discussed the procedure including the risks, benefits and alternatives for the proposed anesthesia with the patient or authorized representative who has indicated his/her understanding and acceptance.     Plan Discussed with: CRNA and Anesthesiologist  Anesthesia Plan Comments:        Anesthesia Quick Evaluation

## 2016-01-19 ENCOUNTER — Ambulatory Visit (HOSPITAL_COMMUNITY)
Admission: RE | Admit: 2016-01-19 | Discharge: 2016-01-20 | Disposition: A | Discharge status: Home / Self Care | Payer: Medicare Other | Source: Ambulatory Visit | Attending: General Surgery | Admitting: General Surgery

## 2016-01-19 ENCOUNTER — Encounter (HOSPITAL_COMMUNITY): Payer: Self-pay | Admitting: *Deleted

## 2016-01-19 ENCOUNTER — Ambulatory Visit (HOSPITAL_COMMUNITY)
Admission: RE | Admit: 2016-01-19 | Discharge: 2016-01-19 | Disposition: A | Discharge status: Home / Self Care | Payer: Medicare Other | Source: Ambulatory Visit | Attending: General Surgery | Admitting: General Surgery

## 2016-01-19 ENCOUNTER — Ambulatory Visit (HOSPITAL_COMMUNITY): Payer: Medicare Other | Admitting: Anesthesiology

## 2016-01-19 ENCOUNTER — Ambulatory Visit
Admission: RE | Admit: 2016-01-19 | Discharge: 2016-01-19 | Disposition: A | Discharge status: Home / Self Care | Payer: Medicare Other | Source: Ambulatory Visit | Attending: General Surgery | Admitting: General Surgery

## 2016-01-19 ENCOUNTER — Encounter (HOSPITAL_COMMUNITY)
Admission: RE | Disposition: A | Discharge status: Home / Self Care | Payer: Self-pay | Source: Ambulatory Visit | Attending: General Surgery

## 2016-01-19 DIAGNOSIS — Z90710 Acquired absence of both cervix and uterus: Secondary | ICD-10-CM | POA: Diagnosis not present

## 2016-01-19 DIAGNOSIS — I10 Essential (primary) hypertension: Secondary | ICD-10-CM | POA: Diagnosis not present

## 2016-01-19 DIAGNOSIS — E119 Type 2 diabetes mellitus without complications: Secondary | ICD-10-CM | POA: Insufficient documentation

## 2016-01-19 DIAGNOSIS — Z880 Allergy status to penicillin: Secondary | ICD-10-CM | POA: Diagnosis not present

## 2016-01-19 DIAGNOSIS — Z170 Estrogen receptor positive status [ER+]: Secondary | ICD-10-CM

## 2016-01-19 DIAGNOSIS — C50412 Malignant neoplasm of upper-outer quadrant of left female breast: Secondary | ICD-10-CM | POA: Diagnosis present

## 2016-01-19 DIAGNOSIS — Z794 Long term (current) use of insulin: Secondary | ICD-10-CM | POA: Diagnosis not present

## 2016-01-19 DIAGNOSIS — I4891 Unspecified atrial fibrillation: Secondary | ICD-10-CM | POA: Diagnosis not present

## 2016-01-19 DIAGNOSIS — E0590 Thyrotoxicosis, unspecified without thyrotoxic crisis or storm: Secondary | ICD-10-CM | POA: Diagnosis not present

## 2016-01-19 DIAGNOSIS — Z7901 Long term (current) use of anticoagulants: Secondary | ICD-10-CM | POA: Insufficient documentation

## 2016-01-19 DIAGNOSIS — Z803 Family history of malignant neoplasm of breast: Secondary | ICD-10-CM | POA: Insufficient documentation

## 2016-01-19 DIAGNOSIS — R928 Other abnormal and inconclusive findings on diagnostic imaging of breast: Secondary | ICD-10-CM | POA: Diagnosis not present

## 2016-01-19 DIAGNOSIS — C50912 Malignant neoplasm of unspecified site of left female breast: Secondary | ICD-10-CM | POA: Diagnosis not present

## 2016-01-19 DIAGNOSIS — G8918 Other acute postprocedural pain: Secondary | ICD-10-CM | POA: Diagnosis not present

## 2016-01-19 HISTORY — PX: BREAST LUMPECTOMY WITH RADIOACTIVE SEED AND SENTINEL LYMPH NODE BIOPSY: SHX6550

## 2016-01-19 HISTORY — PX: BREAST LUMPECTOMY WITH RADIOACTIVE SEED LOCALIZATION: SHX6424

## 2016-01-19 LAB — PROTIME-INR
INR: 1.1
PROTHROMBIN TIME: 14.2 s (ref 11.4–15.2)

## 2016-01-19 LAB — GLUCOSE, CAPILLARY
GLUCOSE-CAPILLARY: 145 mg/dL — AB (ref 65–99)
GLUCOSE-CAPILLARY: 210 mg/dL — AB (ref 65–99)
GLUCOSE-CAPILLARY: 319 mg/dL — AB (ref 65–99)
Glucose-Capillary: 117 mg/dL — ABNORMAL HIGH (ref 65–99)
Glucose-Capillary: 214 mg/dL — ABNORMAL HIGH (ref 65–99)
Glucose-Capillary: 240 mg/dL — ABNORMAL HIGH (ref 65–99)

## 2016-01-19 SURGERY — BREAST LUMPECTOMY WITH RADIOACTIVE SEED AND SENTINEL LYMPH NODE BIOPSY
Anesthesia: General | Site: Breast | Laterality: Left

## 2016-01-19 MED ORDER — WARFARIN SODIUM 5 MG PO TABS: 2.5000 mg | ORAL_TABLET | ORAL | Status: DC

## 2016-01-19 MED ORDER — EPHEDRINE SULFATE 50 MG/ML IJ SOLN
INTRAMUSCULAR | Status: DC | PRN
  Administered 2016-01-19: 10 mg via INTRAVENOUS

## 2016-01-19 MED ORDER — HYDROMORPHONE HCL 1 MG/ML IJ SOLN: 0.2500 mg | INTRAMUSCULAR | Status: DC | PRN

## 2016-01-19 MED ORDER — DEXAMETHASONE SODIUM PHOSPHATE 10 MG/ML IJ SOLN
INTRAMUSCULAR | Status: AC
  Filled 2016-01-19: qty 1

## 2016-01-19 MED ORDER — BUPIVACAINE HCL 0.25 % IJ SOLN
INTRAMUSCULAR | Status: DC | PRN
  Administered 2016-01-19: 10 mL

## 2016-01-19 MED ORDER — BUPIVACAINE HCL (PF) 0.25 % IJ SOLN
INTRAMUSCULAR | Status: AC
  Filled 2016-01-19: qty 30

## 2016-01-19 MED ORDER — SODIUM CHLORIDE 0.9 % IJ SOLN
INTRAMUSCULAR | Status: AC
  Filled 2016-01-19: qty 10

## 2016-01-19 MED ORDER — PHENYLEPHRINE HCL 10 MG/ML IJ SOLN
INTRAMUSCULAR | Status: DC | PRN
  Administered 2016-01-19: 40 ug via INTRAVENOUS

## 2016-01-19 MED ORDER — PHENYLEPHRINE HCL 10 MG/ML IJ SOLN
INTRAVENOUS | Status: DC | PRN
  Administered 2016-01-19: 50 ug/min via INTRAVENOUS

## 2016-01-19 MED ORDER — LIDOCAINE 2% (20 MG/ML) 5 ML SYRINGE
INTRAMUSCULAR | Status: AC
  Filled 2016-01-19: qty 5

## 2016-01-19 MED ORDER — PROPOFOL 10 MG/ML IV BOLUS
INTRAVENOUS | Status: AC
  Filled 2016-01-19: qty 20

## 2016-01-19 MED ORDER — MIDAZOLAM HCL 5 MG/5ML IJ SOLN
INTRAMUSCULAR | Status: DC | PRN
Start: 2016-01-19 — End: 2016-01-19
  Administered 2016-01-19: 2 mg via INTRAVENOUS

## 2016-01-19 MED ORDER — ROSUVASTATIN CALCIUM 5 MG PO TABS
5.0000 mg | ORAL_TABLET | Freq: Every evening | ORAL | Status: DC
Start: 2016-01-19 — End: 2016-01-20
  Administered 2016-01-19: 5 mg via ORAL
  Filled 2016-01-19: qty 1

## 2016-01-19 MED ORDER — LACTATED RINGERS IV SOLN
INTRAVENOUS | Status: DC | PRN
  Administered 2016-01-19: 08:00:00 via INTRAVENOUS

## 2016-01-19 MED ORDER — ONDANSETRON HCL 4 MG/2ML IJ SOLN
INTRAMUSCULAR | Status: DC | PRN
  Administered 2016-01-19: 4 mg via INTRAVENOUS

## 2016-01-19 MED ORDER — TRIMETHOPRIM 100 MG PO TABS
100.0000 mg | ORAL_TABLET | Freq: Every day | ORAL | Status: DC
  Administered 2016-01-20: 100 mg via ORAL
  Filled 2016-01-19 (×2): qty 1

## 2016-01-19 MED ORDER — HEPARIN SODIUM (PORCINE) 5000 UNIT/ML IJ SOLN
5000.0000 [IU] | Freq: Three times a day (TID) | INTRAMUSCULAR | Status: DC
  Administered 2016-01-20: 5000 [IU] via SUBCUTANEOUS
  Filled 2016-01-19: qty 1

## 2016-01-19 MED ORDER — METHYLENE BLUE 0.5 % INJ SOLN
INTRAVENOUS | Status: AC
  Filled 2016-01-19: qty 10

## 2016-01-19 MED ORDER — LEVOTHYROXINE SODIUM 100 MCG PO TABS
125.0000 ug | ORAL_TABLET | Freq: Every day | ORAL | Status: DC
  Administered 2016-01-20: 125 ug via ORAL
  Filled 2016-01-19: qty 1

## 2016-01-19 MED ORDER — BUPIVACAINE HCL (PF) 0.5 % IJ SOLN
INTRAMUSCULAR | Status: DC | PRN
  Administered 2016-01-19: 30 mg

## 2016-01-19 MED ORDER — ARTIFICIAL TEARS OP OINT
TOPICAL_OINTMENT | OPHTHALMIC | Status: DC | PRN
  Administered 2016-01-19: 1 via OPHTHALMIC

## 2016-01-19 MED ORDER — DEXAMETHASONE SODIUM PHOSPHATE 10 MG/ML IJ SOLN
INTRAMUSCULAR | Status: DC | PRN
  Administered 2016-01-19: 5 mg via INTRAVENOUS

## 2016-01-19 MED ORDER — DIPHENHYDRAMINE HCL 50 MG/ML IJ SOLN
INTRAMUSCULAR | Status: DC | PRN
  Administered 2016-01-19: 12.5 mg via INTRAVENOUS

## 2016-01-19 MED ORDER — ADULT MULTIVITAMIN W/MINERALS CH
1.0000 | ORAL_TABLET | Freq: Every day | ORAL | Status: DC
  Administered 2016-01-20: 1 via ORAL
  Filled 2016-01-19: qty 1

## 2016-01-19 MED ORDER — 0.9 % SODIUM CHLORIDE (POUR BTL) OPTIME
TOPICAL | Status: DC | PRN
  Administered 2016-01-19: 1000 mL

## 2016-01-19 MED ORDER — POTASSIUM CHLORIDE IN NACL 20-0.9 MEQ/L-% IV SOLN
INTRAVENOUS | Status: DC
  Administered 2016-01-19: 13:00:00 via INTRAVENOUS
  Filled 2016-01-19: qty 1000

## 2016-01-19 MED ORDER — METOPROLOL SUCCINATE ER 50 MG PO TB24
50.0000 mg | ORAL_TABLET | Freq: Every day | ORAL | Status: DC
  Administered 2016-01-20: 50 mg via ORAL
  Filled 2016-01-19: qty 1

## 2016-01-19 MED ORDER — KCL IN DEXTROSE-NACL 20-5-0.9 MEQ/L-%-% IV SOLN
INTRAVENOUS | Status: DC
Start: 2016-01-19 — End: 2016-01-19
  Filled 2016-01-19: qty 1000

## 2016-01-19 MED ORDER — FENTANYL CITRATE (PF) 100 MCG/2ML IJ SOLN
INTRAMUSCULAR | Status: AC
  Filled 2016-01-19: qty 4

## 2016-01-19 MED ORDER — GLIMEPIRIDE 2 MG PO TABS
2.0000 mg | ORAL_TABLET | Freq: Every day | ORAL | Status: DC
  Administered 2016-01-20: 2 mg via ORAL
  Filled 2016-01-19: qty 1

## 2016-01-19 MED ORDER — TECHNETIUM TC 99M SULFUR COLLOID FILTERED
1.0000 | Freq: Once | INTRAVENOUS | Status: AC | PRN
  Administered 2016-01-19: 1 via INTRADERMAL

## 2016-01-19 MED ORDER — EPHEDRINE 5 MG/ML INJ
INTRAVENOUS | Status: AC
  Filled 2016-01-19: qty 10

## 2016-01-19 MED ORDER — MIDAZOLAM HCL 2 MG/2ML IJ SOLN
INTRAMUSCULAR | Status: AC
  Filled 2016-01-19: qty 2

## 2016-01-19 MED ORDER — ONDANSETRON HCL 4 MG/2ML IJ SOLN: 4.0000 mg | Freq: Four times a day (QID) | INTRAMUSCULAR | Status: DC | PRN

## 2016-01-19 MED ORDER — HYDROCODONE-ACETAMINOPHEN 5-325 MG PO TABS
1.0000 | ORAL_TABLET | ORAL | Status: DC | PRN
  Administered 2016-01-19: 1 via ORAL
  Filled 2016-01-19: qty 1

## 2016-01-19 MED ORDER — PROPOFOL 10 MG/ML IV BOLUS
INTRAVENOUS | Status: DC | PRN
  Administered 2016-01-19: 150 mg via INTRAVENOUS

## 2016-01-19 MED ORDER — ONDANSETRON HCL 4 MG/2ML IJ SOLN
INTRAMUSCULAR | Status: AC
  Filled 2016-01-19: qty 2

## 2016-01-19 MED ORDER — METFORMIN HCL 850 MG PO TABS
850.0000 mg | ORAL_TABLET | Freq: Two times a day (BID) | ORAL | Status: DC
Start: 2016-01-19 — End: 2016-01-20
  Administered 2016-01-19 – 2016-01-20 (×2): 850 mg via ORAL
  Filled 2016-01-19 (×2): qty 1

## 2016-01-19 MED ORDER — ARTIFICIAL TEARS OP OINT
TOPICAL_OINTMENT | OPHTHALMIC | Status: AC
  Filled 2016-01-19: qty 3.5

## 2016-01-19 MED ORDER — MIRABEGRON ER 25 MG PO TB24
50.0000 mg | ORAL_TABLET | Freq: Every day | ORAL | Status: DC
  Administered 2016-01-20: 50 mg via ORAL
  Filled 2016-01-19: qty 2

## 2016-01-19 MED ORDER — FENTANYL CITRATE (PF) 100 MCG/2ML IJ SOLN
INTRAMUSCULAR | Status: DC | PRN
  Administered 2016-01-19: 75 ug via INTRAVENOUS
  Administered 2016-01-19 (×3): 25 ug via INTRAVENOUS

## 2016-01-19 MED ORDER — ONDANSETRON 4 MG PO TBDP: 4.0000 mg | ORAL_TABLET | Freq: Four times a day (QID) | ORAL | Status: DC | PRN

## 2016-01-19 MED ORDER — DIPHENHYDRAMINE HCL 50 MG/ML IJ SOLN
INTRAMUSCULAR | Status: AC
  Filled 2016-01-19: qty 1

## 2016-01-19 MED ORDER — INSULIN GLARGINE 100 UNIT/ML ~~LOC~~ SOLN
16.0000 [IU] | Freq: Every day | SUBCUTANEOUS | Status: DC
  Administered 2016-01-20: 16 [IU] via SUBCUTANEOUS
  Filled 2016-01-19: qty 0.16

## 2016-01-19 MED ORDER — PROMETHAZINE HCL 25 MG/ML IJ SOLN
6.2500 mg | INTRAMUSCULAR | Status: DC | PRN
Start: 2016-01-19 — End: 2016-01-19

## 2016-01-19 MED ORDER — VITAMIN B-12 1000 MCG PO TABS
1000.0000 ug | ORAL_TABLET | Freq: Every day | ORAL | Status: DC
  Administered 2016-01-19: 1000 ug via ORAL
  Filled 2016-01-19 (×3): qty 1

## 2016-01-19 MED ORDER — INSULIN ASPART 100 UNIT/ML ~~LOC~~ SOLN
0.0000 [IU] | Freq: Three times a day (TID) | SUBCUTANEOUS | Status: DC
  Administered 2016-01-19: 7 [IU] via SUBCUTANEOUS
  Administered 2016-01-20: 2 [IU] via SUBCUTANEOUS

## 2016-01-19 MED ORDER — CHLORHEXIDINE GLUCONATE CLOTH 2 % EX PADS: 6.0000 | MEDICATED_PAD | Freq: Once | CUTANEOUS | Status: DC

## 2016-01-19 MED ORDER — KCL IN DEXTROSE-NACL 20-5-0.45 MEQ/L-%-% IV SOLN
INTRAVENOUS | Status: AC
  Filled 2016-01-19: qty 1000

## 2016-01-19 MED ORDER — FAMOTIDINE IN NACL 20-0.9 MG/50ML-% IV SOLN
20.0000 mg | Freq: Two times a day (BID) | INTRAVENOUS | Status: DC
  Administered 2016-01-19 (×2): 20 mg via INTRAVENOUS
  Filled 2016-01-19 (×4): qty 50

## 2016-01-19 MED ORDER — PHENYLEPHRINE 40 MCG/ML (10ML) SYRINGE FOR IV PUSH (FOR BLOOD PRESSURE SUPPORT)
PREFILLED_SYRINGE | INTRAVENOUS | Status: AC
  Filled 2016-01-19: qty 10

## 2016-01-19 MED ORDER — WARFARIN - PHYSICIAN DOSING INPATIENT
Freq: Every day | Status: DC
Start: 2016-01-19 — End: 2016-01-20
  Administered 2016-01-19: 18:00:00

## 2016-01-19 MED ORDER — LOSARTAN POTASSIUM 50 MG PO TABS
50.0000 mg | ORAL_TABLET | Freq: Every day | ORAL | Status: DC
  Administered 2016-01-20: 50 mg via ORAL
  Filled 2016-01-19: qty 1

## 2016-01-19 MED ORDER — WARFARIN SODIUM 5 MG PO TABS
5.0000 mg | ORAL_TABLET | ORAL | Status: AC
  Administered 2016-01-19: 5 mg via ORAL
  Filled 2016-01-19: qty 1

## 2016-01-19 MED ORDER — MORPHINE SULFATE (PF) 2 MG/ML IV SOLN: 1.0000 mg | INTRAVENOUS | Status: DC | PRN

## 2016-01-19 MED ORDER — FERROUS SULFATE 325 (65 FE) MG PO TABS
325.0000 mg | ORAL_TABLET | Freq: Every day | ORAL | Status: DC
  Administered 2016-01-20: 325 mg via ORAL
  Filled 2016-01-19: qty 1

## 2016-01-19 SURGICAL SUPPLY — 48 items
ADH SKN CLS APL DERMABOND .7 (GAUZE/BANDAGES/DRESSINGS) ×1
APPLIER CLIP 9.375 MED OPEN (MISCELLANEOUS) ×2
APR CLP MED 9.3 20 MLT OPN (MISCELLANEOUS) ×1
BLADE SURG 15 STRL LF DISP TIS (BLADE) ×1 IMPLANT
BLADE SURG 15 STRL SS (BLADE) ×2
CANISTER SUCTION 2500CC (MISCELLANEOUS) ×2 IMPLANT
CHLORAPREP W/TINT 26ML (MISCELLANEOUS) ×2 IMPLANT
CLIP APPLIE 9.375 MED OPEN (MISCELLANEOUS) ×1 IMPLANT
CONT SPEC 4OZ CLIKSEAL STRL BL (MISCELLANEOUS) ×2 IMPLANT
COVER MAYO STAND STRL (DRAPES) ×1 IMPLANT
COVER PROBE W GEL 5X96 (DRAPES) ×2 IMPLANT
COVER SURGICAL LIGHT HANDLE (MISCELLANEOUS) ×2 IMPLANT
DECANTER SPIKE VIAL GLASS SM (MISCELLANEOUS) ×1 IMPLANT
DERMABOND ADVANCED (GAUZE/BANDAGES/DRESSINGS) ×1
DERMABOND ADVANCED .7 DNX12 (GAUZE/BANDAGES/DRESSINGS) ×1 IMPLANT
DEVICE DUBIN SPECIMEN MAMMOGRA (MISCELLANEOUS) ×1 IMPLANT
DRAPE CHEST BREAST 15X10 FENES (DRAPES) ×2 IMPLANT
DRAPE UTILITY XL STRL (DRAPES) ×2 IMPLANT
ELECT COATED BLADE 2.86 ST (ELECTRODE) ×2 IMPLANT
ELECT REM PT RETURN 9FT ADLT (ELECTROSURGICAL) ×2
ELECTRODE REM PT RTRN 9FT ADLT (ELECTROSURGICAL) ×1 IMPLANT
GLOVE BIO SURGEON STRL SZ7.5 (GLOVE) ×3 IMPLANT
GLOVE BIOGEL PI IND STRL 6.5 (GLOVE) IMPLANT
GLOVE BIOGEL PI INDICATOR 6.5 (GLOVE) ×1
GLOVE ECLIPSE 7.0 STRL STRAW (GLOVE) ×1 IMPLANT
GLOVE SURG SS PI 6.5 STRL IVOR (GLOVE) ×1 IMPLANT
GOWN STRL REUS W/ TWL LRG LVL3 (GOWN DISPOSABLE) ×2 IMPLANT
GOWN STRL REUS W/TWL LRG LVL3 (GOWN DISPOSABLE) ×4
KIT BASIN OR (CUSTOM PROCEDURE TRAY) ×2 IMPLANT
KIT MARKER MARGIN INK (KITS) ×3 IMPLANT
LIGHT WAVEGUIDE WIDE FLAT (MISCELLANEOUS) ×1 IMPLANT
NDL HYPO 25GX1X1/2 BEV (NEEDLE) IMPLANT
NDL HYPO 25X1 1.5 SAFETY (NEEDLE) ×1 IMPLANT
NEEDLE HYPO 25GX1X1/2 BEV (NEEDLE) ×2 IMPLANT
NEEDLE HYPO 25X1 1.5 SAFETY (NEEDLE) ×2 IMPLANT
NS IRRIG 1000ML POUR BTL (IV SOLUTION) ×2 IMPLANT
PACK SURGICAL SETUP 50X90 (CUSTOM PROCEDURE TRAY) ×2 IMPLANT
PENCIL BUTTON HOLSTER BLD 10FT (ELECTRODE) ×2 IMPLANT
SPONGE LAP 18X18 X RAY DECT (DISPOSABLE) ×2 IMPLANT
SUT ETHILON 3 0 FSL (SUTURE) IMPLANT
SUT MNCRL AB 4-0 PS2 18 (SUTURE) ×4 IMPLANT
SUT VIC AB 3-0 SH 18 (SUTURE) ×2 IMPLANT
SYR BULB 3OZ (MISCELLANEOUS) ×2 IMPLANT
SYR CONTROL 10ML LL (SYRINGE) ×2 IMPLANT
TOWEL OR 17X24 6PK STRL BLUE (TOWEL DISPOSABLE) ×2 IMPLANT
TOWEL OR 17X26 10 PK STRL BLUE (TOWEL DISPOSABLE) ×2 IMPLANT
TUBE CONNECTING 12X1/4 (SUCTIONS) ×2 IMPLANT
YANKAUER SUCT BULB TIP NO VENT (SUCTIONS) ×2 IMPLANT

## 2016-01-19 NOTE — Interval H&P Note (Signed)
History and Physical Interval Note:  01/19/2016 8:08 AM  Summer Hawkins  has presented today for surgery, with the diagnosis of LEFT BREAST CANCER  The various methods of treatment have been discussed with the patient and family. After consideration of risks, benefits and other options for treatment, the patient has consented to  Procedure(s): LEFT BREAST LUMPECTOMY WITH RADIOACTIVE SEED AND SENTINEL LYMPH NODE BIOPSY (Left) as a surgical intervention .  The patient's history has been reviewed, patient examined, no change in status, stable for surgery.  I have reviewed the patient's chart and labs.  Questions were answered to the patient's satisfaction.     TOTH III,Alonia Dibuono S

## 2016-01-19 NOTE — Op Note (Signed)
01/19/2016  9:45 AM  PATIENT:  Summer Hawkins  81 y.o. female  PRE-OPERATIVE DIAGNOSIS:  LEFT BREAST CANCER  POST-OPERATIVE DIAGNOSIS:  LEFT BREAST CANCER  PROCEDURE:  Procedure(s): LEFT BREAST LUMPECTOMY WITH RADIOACTIVE SEED AND SENTINEL LYMPH NODE BIOPSY (Left)  SURGEON:  Surgeon(s) and Role:    * Jovita Kussmaul, MD - Primary  PHYSICIAN ASSISTANT:   ASSISTANTS: none   ANESTHESIA:   local and general  EBL:  Minimal   BLOOD ADMINISTERED:none  DRAINS: none   LOCAL MEDICATIONS USED:  MARCAINE     SPECIMEN:  Source of Specimen:  left breast tissue and sentinel node  DISPOSITION OF SPECIMEN:  PATHOLOGY  COUNTS:  YES  TOURNIQUET:  * No tourniquets in log *  DICTATION: .Dragon Dictation   After informed consent was obtained the patient was brought to the operating room and placed in the supine position on the operating room table. After adequate induction of general anesthesia the patient's left chest, breast, and axillary area were prepped with ChloraPrep, allowed to dry, and draped in usual sterile manner. An appropriate timeout was performed. Earlier in the day the patient underwent injection of 1 mCi of technetium sulfur colloid in the subareolar position on the left. Previously an I-125 seed was placed in the upper outer quadrant of the left breast to mark an area of an invasive breast cancer. The neoprobe was initially set to technetium in the left axilla was examined. A hot spot was readily identified. The neoprobe was then set to I-125 in the area of the radioactive seed was also identified in the upper outer quadrant of the left breast. I decided to access both of these areas through a curvilinear incision on the upper outer edge of the breast. This area was infiltrated with quarter percent Marcaine. A small incision was made near the anterior axillary line at the edge of the breast with a 15 blade knife. The incision was carried through the skin and subcutaneous tissue  sharply with electrocautery. The neoprobe was then set to technetium. The dissection was carried into the axilla under the direction of the neoprobe. One hot lymph node was identified. This lymph node was excised sharply with the electrocautery and the lymphatics were controlled with clips. Ex vivo counts on the sentinel node were approximately 2500. No other hot or palpable lymph nodes were identified in the left axilla. The neoprobe was then set to I-125. The area of the radioactive seed was readily identified. The dissection was then carried through the previous incision into the upper outer quadrant of the breast. This dissection was directed by the neoprobe. Once we neared the area of the radioactive seeds then the area was grasped with an Allis clamp. A circular portion of breast tissue was excised sharply around the radioactive seed while checking the area of radioactivity frequently. Once the specimen was removed it was oriented with the appropriate paint colors. A specimen radiograph was obtained that showed the clip and seed to be near the center of the specimen. The specimen was then sent to pathology for further evaluation. Hemostasis was achieved using Bovie electrocautery. The wound was irrigated with saline and infiltrated with quarter percent Marcaine. The cavity was marked with clips. The deep layer of both wounds were then closed with layers of interrupted 3-0 Vicryl stitches. The skin was then closed with interrupted 4-0 Monocryl subcuticular stitches. Dermabond dressings were applied. The patient tolerated the procedure well. At the end of the case all needle sponge and  instrument counts were correct. The patient was then awakened and taken to recovery in stable condition.  PLAN OF CARE: Admit for overnight observation  PATIENT DISPOSITION:  PACU - hemodynamically stable.   Delay start of Pharmacological VTE agent (>24hrs) due to surgical blood loss or risk of bleeding: no

## 2016-01-19 NOTE — Anesthesia Procedure Notes (Signed)
Procedure Name: LMA Insertion Date/Time: 01/19/2016 8:41 AM Performed by: Suzy Bouchard Pre-anesthesia Checklist: Patient identified, Emergency Drugs available, Suction available, Patient being monitored and Timeout performed Patient Re-evaluated:Patient Re-evaluated prior to inductionOxygen Delivery Method: Circle system utilized Preoxygenation: Pre-oxygenation with 100% oxygen Intubation Type: IV induction Ventilation: Mask ventilation without difficulty LMA: LMA inserted LMA Size: 4.0 Number of attempts: 1 Placement Confirmation: ETT inserted through vocal cords under direct vision,  positive ETCO2 and breath sounds checked- equal and bilateral Tube secured with: Tape Dental Injury: Teeth and Oropharynx as per pre-operative assessment

## 2016-01-19 NOTE — Discharge Instructions (Signed)

## 2016-01-19 NOTE — Anesthesia Procedure Notes (Addendum)
Anesthesia Regional Block:  TAP block  Pre-Anesthetic Checklist: ,, timeout performed, Correct Patient, Correct Site, Correct Laterality, Correct Procedure, Correct Position, site marked, Risks and benefits discussed,  Surgical consent,  Pre-op evaluation,  At surgeon's request and post-op pain management  Laterality: Left  Prep: chloraprep       Needles:  Injection technique: Single-shot  Needle Type: Echogenic Stimulator Needle     Needle Length: 10cm 10 cm Needle Gauge: 21 and 21 G    Additional Needles:  Procedures: ultrasound guided (picture in chart) TAP block Narrative:  Start time: 01/19/2016 8:01 AM End time: 01/19/2016 8:11 AM Injection made incrementally with aspirations every 5 mL.  Performed by: Personally

## 2016-01-19 NOTE — Transfer of Care (Signed)
Immediate Anesthesia Transfer of Care Note  Patient: Summer Hawkins  Procedure(s) Performed: Procedure(s): LEFT BREAST LUMPECTOMY WITH RADIOACTIVE SEED AND SENTINEL LYMPH NODE BIOPSY (Left)  Patient Location: PACU  Anesthesia Type:General  Level of Consciousness: sedated  Airway & Oxygen Therapy: Patient Spontanous Breathing and Patient connected to nasal cannula oxygen  Post-op Assessment: Report given to RN and Post -op Vital signs reviewed and stable  Post vital signs: Reviewed and stable  Last Vitals:  Vitals:   01/19/16 0657 01/19/16 0952  BP: (!) 176/99   Pulse: 91   Resp: 20   Temp: 36.9 C 36.4 C    Last Pain:  Vitals:   01/19/16 0657  TempSrc: Oral      Patients Stated Pain Goal: 6 ([id number])  Complications: No apparent anesthesia complications

## 2016-01-19 NOTE — Anesthesia Postprocedure Evaluation (Signed)
Anesthesia Post Note  Patient: Summer Hawkins  Procedure(s) Performed: Procedure(s) (LRB): LEFT BREAST LUMPECTOMY WITH RADIOACTIVE SEED AND SENTINEL LYMPH NODE BIOPSY (Left)  Patient location during evaluation: PACU Anesthesia Type: General Level of consciousness: sedated Pain management: pain level controlled Vital Signs Assessment: post-procedure vital signs reviewed and stable Respiratory status: spontaneous breathing and respiratory function stable Cardiovascular status: stable Anesthetic complications: no    Last Vitals:  Vitals:   01/19/16 1028 01/19/16 1056  BP:  (!) 157/82  Pulse:  77  Resp:    Temp: 36.1 C 36.3 C    Last Pain:  Vitals:   01/19/16 1056  TempSrc: Oral                 Lawonda Pretlow DANIEL

## 2016-01-19 NOTE — Progress Notes (Signed)
1050 Received pt from PACU A&Ox4. Left breast incision with skin glue dry and intact, open to air. Pt denies pain at this time.

## 2016-01-19 NOTE — H&P (Signed)
Summer Hawkins  Location: [facility] Patient #: [id number] DOB: Sep 19, 1934 Married / Language: English / Race: White Female   History of Present Illness The patient is a 81 year old female who presents with breast cancer. We are asked to see the patient in consultation by Dr. Osborne Casco to evaluate her for a new left breast cancer. The patient is a 81 year old white female who recently went for a routine screening mammogram. At that time she was found to have an area of abnormal calcification in the upper outer left breast. This measured 1.9 cm. This was biopsied and came back as an invasive breast cancer. She was ER/PR positive and HER-2 negative with a Ki-67 of 5%. She does not take any hormone replacement. She is on Coumadin for atrial fibrillation. Her only family history of breast cancer is a sister at the age of 93 years old   Other Problems  Atrial Fibrillation Breast Cancer Cholelithiasis Diabetes Mellitus High blood pressure Oophorectomy Bilateral. Thyroid Disease  Past Surgical History Breast Biopsy Bilateral. Cataract Surgery Bilateral. Foot Surgery Bilateral. Gallbladder Surgery - Laparoscopic Hysterectomy (not due to cancer) - Complete Shoulder Surgery Left.  Diagnostic Studies History Colonoscopy 1-5 years ago Mammogram within last year Pap Smear >5 years ago  Allergies  Penicillin G Pot in Dextrose *PENICILLINS*  Medication History Levothyroxine Sodium (125MCG Tablet, Oral) Active. Myrbetriq (50MG Tablet ER 24HR, Oral) Active. Warfarin Sodium (5MG Tablet, Oral) Active. Glimepiride (2MG Tablet, Oral) Active. Invokana (100MG Tablet, Oral) Active. Losartan Potassium (50MG Tablet, Oral) Active. Metoprolol Succinate ER (50MG Tablet ER 24HR, Oral) Active. Rosuvastatin Calcium (10MG Tablet, Oral) Active. Toujeo SoloStar (300UNIT/ML Soln Pen-inj, Subcutaneous) Active. MetFORMIN HCl (850MG Tablet, Oral)  Active. Medications Reconciled  Social History Caffeine use Coffee. No alcohol use No drug use Tobacco use Never smoker.  Family History Arthritis Father, Mother, Sister. Cervical Cancer Sister. Diabetes Mellitus Brother, Mother, Sister. Heart Disease Mother, Sister. Hypertension Brother, Mother, Sister. Seizure disorder Sister.  Pregnancy / Birth History  Age at menarche 49 years. Age of menopause 69-60 Gravida 2 Length (months) of breastfeeding 7-12 Maternal age 50-20 Para 2    Review of Systems  General Not Present- Appetite Loss, Chills, Fatigue, Fever, Night Sweats, Weight Gain and Weight Loss. Skin Not Present- Change in Wart/Mole, Dryness, Hives, Jaundice, New Lesions, Non-Healing Wounds, Rash and Ulcer. HEENT Present- Wears glasses/contact lenses. Not Present- Earache, Hearing Loss, Hoarseness, Nose Bleed, Oral Ulcers, Ringing in the Ears, Seasonal Allergies, Sinus Pain, Sore Throat, Visual Disturbances and Yellow Eyes. Breast Present- Breast Mass. Not Present- Breast Pain, Nipple Discharge and Skin Changes. Cardiovascular Not Present- Chest Pain, Difficulty Breathing Lying Down, Leg Cramps, Palpitations, Rapid Heart Rate, Shortness of Breath and Swelling of Extremities. Gastrointestinal Not Present- Abdominal Pain, Bloating, Bloody Stool, Change in Bowel Habits, Chronic diarrhea, Constipation, Difficulty Swallowing, Excessive gas, Gets full quickly at meals, Hemorrhoids, Indigestion, Nausea, Rectal Pain and Vomiting. Female Genitourinary Present- Frequency, Nocturia and Urgency. Not Present- Painful Urination and Pelvic Pain. Musculoskeletal Present- Joint Stiffness. Not Present- Back Pain, Joint Pain, Muscle Pain, Muscle Weakness and Swelling of Extremities. Neurological Present- Trouble walking. Not Present- Decreased Memory, Fainting, Headaches, Numbness, Seizures, Tingling, Tremor and Weakness. Psychiatric Not Present- Anxiety, Bipolar, Change in  Sleep Pattern, Depression, Fearful and Frequent crying. Endocrine Not Present- Cold Intolerance, Excessive Hunger, Hair Changes, Heat Intolerance, Hot flashes and New Diabetes. Hematology Present- Blood Thinners and Easy Bruising. Not Present- Excessive bleeding, Gland problems, HIV and Persistent Infections.  Vitals  Weight: 234 lb Height:  64in Body Surface Area: 2.09 m Body Mass Index: 40.17 kg/m  Temp.: 73F(Temporal)  Pulse: 76 (Regular)  BP: 132/72 (Sitting, Left Arm, Standard)       Physical Exam General Mental Status-Alert. General Appearance-Consistent with stated age. Hydration-Well hydrated. Voice-Normal.  Head and Neck Head-normocephalic, atraumatic with no lesions or palpable masses. Trachea-midline. Thyroid Gland Characteristics - normal size and consistency.  Eye Eyeball - Bilateral-Extraocular movements intact. Sclera/Conjunctiva - Bilateral-No scleral icterus.  Chest and Lung Exam Chest and lung exam reveals -quiet, even and easy respiratory effort with no use of accessory muscles and on auscultation, normal breath sounds, no adventitious sounds and normal vocal resonance. Inspection Chest Wall - Normal. Back - normal.  Breast Note: There is no palpable mass in either breast. There is no palpable axillary, supraclavicular, or cervical lymphadenopathy.   Cardiovascular Cardiovascular examination reveals -normal heart sounds, regular rate and rhythm with no murmurs and normal pedal pulses bilaterally.  Abdomen Inspection Inspection of the abdomen reveals - No Hernias. Skin - Scar - no surgical scars. Palpation/Percussion Palpation and Percussion of the abdomen reveal - Soft, Non Tender, No Rebound tenderness, No Rigidity (guarding) and No hepatosplenomegaly. Auscultation Auscultation of the abdomen reveals - Bowel sounds normal.  Neurologic Neurologic evaluation reveals -alert and oriented x 3 with no impairment  of recent or remote memory. Mental Status-Normal.  Musculoskeletal Normal Exam - Left-Upper Extremity Strength Normal and Lower Extremity Strength Normal. Normal Exam - Right-Upper Extremity Strength Normal and Lower Extremity Strength Normal.  Lymphatic Head & Neck  General Head & Neck Lymphatics: Bilateral - Description - Normal. Axillary  General Axillary Region: Bilateral - Description - Normal. Tenderness - Non Tender. Femoral & Inguinal  Generalized Femoral & Inguinal Lymphatics: Bilateral - Description - Normal. Tenderness - Non Tender.    Assessment & Plan  MALIGNANT NEOPLASM OF UPPER-OUTER QUADRANT OF LEFT BREAST IN FEMALE, ESTROGEN RECEPTOR POSITIVE (C50.412) Impression: The patient appears to have a small stage I cancer in the upper outer left breast. I have discussed with her in detail the different options for treatment and at this point she favors breast conservation. I think this is a very reasonable way of treating her breast cancer. I have discussed with her in detail the risks and benefits of the operation to remove the cancer as well as some of the technical aspects and she understands and wishes to proceed. I will plan for a left breast radioactive seed localized lumpectomy and sentinel node mapping. I will obtain cardiac clearance prior to scheduling surgery as she is on Coumadin for atrial fibrillation. I will go ahead and refer her to medical and radiation oncology as well. Current Plans Referred to Oncology, for evaluation and follow up (Oncology). Routine.

## 2016-01-20 ENCOUNTER — Encounter (HOSPITAL_COMMUNITY): Payer: Self-pay | Admitting: General Surgery

## 2016-01-20 DIAGNOSIS — C50412 Malignant neoplasm of upper-outer quadrant of left female breast: Secondary | ICD-10-CM | POA: Diagnosis not present

## 2016-01-20 DIAGNOSIS — I4891 Unspecified atrial fibrillation: Secondary | ICD-10-CM | POA: Diagnosis not present

## 2016-01-20 DIAGNOSIS — I10 Essential (primary) hypertension: Secondary | ICD-10-CM | POA: Diagnosis not present

## 2016-01-20 DIAGNOSIS — E119 Type 2 diabetes mellitus without complications: Secondary | ICD-10-CM | POA: Diagnosis not present

## 2016-01-20 DIAGNOSIS — Z880 Allergy status to penicillin: Secondary | ICD-10-CM | POA: Diagnosis not present

## 2016-01-20 DIAGNOSIS — E0590 Thyrotoxicosis, unspecified without thyrotoxic crisis or storm: Secondary | ICD-10-CM | POA: Diagnosis not present

## 2016-01-20 LAB — PROTIME-INR
INR: 1.07
Prothrombin Time: 13.9 seconds (ref 11.4–15.2)

## 2016-01-20 LAB — GLUCOSE, CAPILLARY: Glucose-Capillary: 194 mg/dL — ABNORMAL HIGH (ref 65–99)

## 2016-01-20 MED ORDER — HYDROCODONE-ACETAMINOPHEN 5-325 MG PO TABS: 1.0000 | ORAL_TABLET | ORAL | 0 refills | Status: DC | PRN

## 2016-01-20 NOTE — Progress Notes (Signed)
1 Day Post-Op  Subjective: Doing well. Denies pain  Objective: Vital signs in last 24 hours: Temp:  [97 F (36.1 C)-98.2 F (36.8 C)] 97.4 F (36.3 C) (11/09 0451) Pulse Rate:  [73-93] 73 (11/09 0451) Resp:  [17-18] 18 (11/09 0451) BP: (143-166)/(74-90) 166/90 (11/09 0451) SpO2:  [94 %-99 %] 99 % (11/09 0451) Last BM Date: 01/19/16  Intake/Output from previous day: 11/08 0701 - 11/09 0700 In: 1030 [P.O.:240; I.V.:740; IV Piggyback:50] Out: 250 [Urine:200; Blood:50] Intake/Output this shift: No intake/output data recorded.  Resp: clear to auscultation bilaterally Breasts: incision looks good Cardio: regular rate and rhythm GI: soft, non-tender; bowel sounds normal; no masses,  no organomegaly  Lab Results:  No results for input(s): WBC, HGB, HCT, PLT in the last 72 hours. BMET No results for input(s): NA, K, CL, CO2, GLUCOSE, BUN, CREATININE, CALCIUM in the last 72 hours. PT/INR  Recent Labs  01/19/16 0703 01/20/16 0243  LABPROT 14.2 13.9  INR 1.10 1.07   ABG No results for input(s): PHART, HCO3 in the last 72 hours.  Invalid input(s): PCO2, PO2  Studies/Results: Mm Breast Surgical Specimen  Result Date: 01/19/2016 CLINICAL DATA:  Post surgical removal of left breast lesion. EXAM: SPECIMEN RADIOGRAPH OF THE LEFT BREAST COMPARISON:  Previous exam(s). FINDINGS: Status post excision of the left breast. The radioactive seed and biopsy marker clip are present, completely intact, and were marked for pathology. IMPRESSION: Specimen radiograph of the left breast. Electronically Signed   By: Altamese Cabal M.D.   On: 01/19/2016 09:30    Anti-infectives: Anti-infectives    Start     Dose/Rate Route Frequency Ordered Stop   01/19/16 1100  trimethoprim (TRIMPEX) tablet 100 mg     100 mg Oral Daily 01/19/16 1054     01/19/16 0700  vancomycin (VANCOCIN) 1,500 mg in sodium chloride 0.9 % 500 mL IVPB     1,500 mg 250 mL/hr over 120 Minutes Intravenous To ShortStay  Surgical 01/18/16 1311 01/19/16 0941      Assessment/Plan: s/p Procedure(s): LEFT BREAST LUMPECTOMY WITH RADIOACTIVE SEED AND SENTINEL LYMPH NODE BIOPSY (Left) Advance diet Discharge  LOS: 0 days    TOTH III,Haitham Dolinsky S 01/20/2016

## 2016-01-20 NOTE — Discharge Summary (Signed)
Physician Discharge Summary  Patient ID: Summer Hawkins MRN: [id number] DOB/AGE: [birth year] 81 y.o.  Admit date: 01/19/2016 Discharge date: 01/20/2016  Admission Diagnoses:  Discharge Diagnoses:  Active Problems:   Breast cancer of upper-outer quadrant of left female breast [facility])   Discharged Condition: good  Hospital Course: the patient underwent left breast lumpectomy for breast cancer. She tolerated surgery well. On pod 1 she was ready for discharge home  Consults: None  Significant Diagnostic Studies: none  Treatments: surgery: as above  Discharge Exam: Blood pressure (!) 166/90, pulse 73, temperature 97.4 F (36.3 C), temperature source Oral, resp. rate 18, height 5\' 4"  (1.626 m), weight 108.9 kg (240 lb), SpO2 99 %. Resp: clear to auscultation bilaterally Breasts: incision looks good Cardio: regular rate and rhythm GI: soft, non-tender; bowel sounds normal; no masses,  no organomegaly  Disposition: 01-Home or Self Care  Discharge Instructions    Call MD for:  difficulty breathing, headache or visual disturbances    Complete by:  As directed    Call MD for:  extreme fatigue    Complete by:  As directed    Call MD for:  hives    Complete by:  As directed    Call MD for:  persistant dizziness or light-headedness    Complete by:  As directed    Call MD for:  persistant nausea and vomiting    Complete by:  As directed    Call MD for:  redness, tenderness, or signs of infection (pain, swelling, redness, odor or green/yellow discharge around incision site)    Complete by:  As directed    Call MD for:  severe uncontrolled pain    Complete by:  As directed    Call MD for:  temperature >100.4    Complete by:  As directed    Diet - low sodium heart healthy    Complete by:  As directed    Discharge instructions    Complete by:  As directed    May shower. Diet and activity as tolerated. You may be most comfortable supporting the breast in a bra. Avoid hard direct contact  to the incision for the first couple weeks.   Increase activity slowly    Complete by:  As directed    No wound care    Complete by:  As directed        Medication List    TAKE these medications   cyanocobalamin 1000 MCG tablet Take 1,000 mcg by mouth daily.   ferrous sulfate 325 (65 FE) MG tablet Take 325 mg by mouth daily with breakfast.   glimepiride 2 MG tablet Commonly known as:  AMARYL Take 2 mg by mouth daily with breakfast.   HYDROcodone-acetaminophen 5-325 MG tablet Commonly known as:  NORCO/VICODIN Take 1-2 tablets by mouth every 4 (four) hours as needed for moderate pain.   levothyroxine 125 MCG tablet Commonly known as:  SYNTHROID, LEVOTHROID Take 125 mcg by mouth daily.   losartan 50 MG tablet Commonly known as:  COZAAR Take 50 mg by mouth daily.   metFORMIN 850 MG tablet Commonly known as:  GLUCOPHAGE Take 850 mg by mouth 2 (two) times daily with a meal.   metoprolol succinate 50 MG 24 hr tablet Commonly known as:  TOPROL-XL Take 1 tablet (50 mg total) by mouth daily.   multivitamin with minerals tablet Take 1 tablet by mouth daily.   MYRBETRIQ 50 MG Tb24 tablet Generic drug:  mirabegron ER Take 50 mg by mouth  daily.   rosuvastatin 10 MG tablet Commonly known as:  CRESTOR Take 5 mg by mouth every evening.   TOUJEO SOLOSTAR 300 UNIT/ML Sopn Generic drug:  Insulin Glargine Inject 16 Units into the skin daily.   trimethoprim 100 MG tablet Commonly known as:  TRIMPEX Take 100 mg by mouth daily.   warfarin 5 MG tablet Commonly known as:  COUMADIN Take 2.5-5 mg by mouth See admin instructions. Take 2.5 mg by mouth daily on Monday and Friday. Take 5 mg by mouth on all other days      Follow-up Information    TOTH III,Myan Suit S, MD Follow up in 2 week(s).   Specialty:  General Surgery Contact information: [address] [place]  [address] [phone]           Signed: Merrie Roof 01/20/2016, 8:04 AM

## 2016-01-20 NOTE — Progress Notes (Signed)
D/C papers gone over with pt. And husband. IV taken out. No questions/complaints. Prescription given to pt. Pt. D/c'd successfully (refused w/c.).

## 2016-01-25 ENCOUNTER — Telehealth: Payer: Self-pay | Admitting: *Deleted

## 2016-01-25 ENCOUNTER — Other Ambulatory Visit (HOSPITAL_COMMUNITY): Payer: Medicare Other

## 2016-01-25 NOTE — Telephone Encounter (Signed)
Received order for onctoype testing. Requisition sent to pathology. Received by Summer Hawkins.

## 2016-01-29 ENCOUNTER — Emergency Department (HOSPITAL_COMMUNITY)
Admission: EM | Admit: 2016-01-29 | Discharge: 2016-01-29 | Disposition: A | Discharge status: Home / Self Care | Payer: Medicare Other | Attending: Emergency Medicine | Admitting: Emergency Medicine

## 2016-01-29 ENCOUNTER — Encounter (HOSPITAL_COMMUNITY): Payer: Self-pay

## 2016-01-29 DIAGNOSIS — Z7901 Long term (current) use of anticoagulants: Secondary | ICD-10-CM | POA: Diagnosis not present

## 2016-01-29 DIAGNOSIS — Z79899 Other long term (current) drug therapy: Secondary | ICD-10-CM | POA: Insufficient documentation

## 2016-01-29 DIAGNOSIS — Z853 Personal history of malignant neoplasm of breast: Secondary | ICD-10-CM | POA: Insufficient documentation

## 2016-01-29 DIAGNOSIS — M5440 Lumbago with sciatica, unspecified side: Secondary | ICD-10-CM | POA: Diagnosis not present

## 2016-01-29 DIAGNOSIS — M549 Dorsalgia, unspecified: Secondary | ICD-10-CM

## 2016-01-29 DIAGNOSIS — M5430 Sciatica, unspecified side: Secondary | ICD-10-CM

## 2016-01-29 DIAGNOSIS — N189 Chronic kidney disease, unspecified: Secondary | ICD-10-CM | POA: Insufficient documentation

## 2016-01-29 DIAGNOSIS — Z7984 Long term (current) use of oral hypoglycemic drugs: Secondary | ICD-10-CM | POA: Insufficient documentation

## 2016-01-29 DIAGNOSIS — E1122 Type 2 diabetes mellitus with diabetic chronic kidney disease: Secondary | ICD-10-CM | POA: Diagnosis not present

## 2016-01-29 DIAGNOSIS — M5441 Lumbago with sciatica, right side: Secondary | ICD-10-CM | POA: Diagnosis not present

## 2016-01-29 DIAGNOSIS — E039 Hypothyroidism, unspecified: Secondary | ICD-10-CM | POA: Diagnosis not present

## 2016-01-29 DIAGNOSIS — M545 Low back pain: Secondary | ICD-10-CM | POA: Diagnosis present

## 2016-01-29 DIAGNOSIS — I129 Hypertensive chronic kidney disease with stage 1 through stage 4 chronic kidney disease, or unspecified chronic kidney disease: Secondary | ICD-10-CM | POA: Diagnosis not present

## 2016-01-29 MED ORDER — HYDROCODONE-ACETAMINOPHEN 5-325 MG PO TABS: 2.0000 | ORAL_TABLET | ORAL | 0 refills | Status: DC | PRN

## 2016-01-29 MED ORDER — HYDROCODONE-ACETAMINOPHEN 5-325 MG PO TABS
1.0000 | ORAL_TABLET | Freq: Once | ORAL | Status: AC
  Administered 2016-01-29: 1 via ORAL
  Filled 2016-01-29: qty 1

## 2016-01-29 MED ORDER — DIAZEPAM 2 MG PO TABS: 2.0000 mg | ORAL_TABLET | Freq: Four times a day (QID) | ORAL | 0 refills | Status: DC | PRN

## 2016-01-29 MED ORDER — DIAZEPAM 2 MG PO TABS
2.0000 mg | ORAL_TABLET | Freq: Once | ORAL | Status: AC
Start: 2016-01-29 — End: 2016-01-29
  Administered 2016-01-29: 2 mg via ORAL
  Filled 2016-01-29: qty 1

## 2016-01-29 NOTE — ED Triage Notes (Signed)
Pt presents with c/o spasms in her right lower back. Pt reports she initially started having the spasms and pain approx 6 weeks ago but that this particular time, it started about 11 days ago. Pt reports she had breast cancer surgery on 11/8. Pt reports the pain is constant and is now sore in nature. Pt is ambulatory, denies any recent injury, urinary burning, or hematuria.

## 2016-01-29 NOTE — ED Provider Notes (Signed)
[name] DEPT Provider Note   CSN: [id number] Arrival date & time: 01/29/16  [id number]     History   Chief Complaint Chief Complaint  Patient presents with  . Back spasms    HPI Summer Hawkins is a 81 y.o. female.  81 year old female here with right-sided back spasms 6 weeks. Seen by her Dr. prescribed a muscle relaxant which has helped somewhat. Took an overdose of her hydrocodone which did help as well 2. Denies any history of trauma. No rashes. Pain comes and goes and nothing seems to make it better but is worse with movement. Denies any urinary symptoms. No radiation of the pain down her leg. Denies any trauma.      Past Medical History:  Diagnosis Date  . Atrial fibrillation ([place])   . Breast cancer, left ([place])   . Chronic kidney disease    stage III - patient was unaware  . Diabetes mellitus   . Dyspnea   . Dysrhythmia    Afib  . H/O: hysterectomy   . History of colonoscopy 01/25/2010  . History of mammogram 08/04/2009  . Hyperlipidemia   . Hypertension   . Hypothyroidism   . Ketoacidosis, diabetic, no coma, non-insulin dependent    Type II  . Vitamin B12 deficiency     Patient Active Problem List   Diagnosis Date Noted  . Breast cancer of upper-outer quadrant of left female breast ([place]) 01/05/2016    Past Surgical History:  Procedure Laterality Date  . ABDOMINAL HYSTERECTOMY    . Bilateral foot surgery    . BREAST LUMPECTOMY WITH RADIOACTIVE SEED AND SENTINEL LYMPH NODE BIOPSY Left 01/19/2016   Procedure: LEFT BREAST LUMPECTOMY WITH RADIOACTIVE SEED AND SENTINEL LYMPH NODE BIOPSY;  Surgeon: Autumn Messing III, MD;  Location: [facility];  Service: General;  Laterality: Left;  . BREAST LUMPECTOMY WITH RADIOACTIVE SEED LOCALIZATION Left 01/19/2016  . CARDIOVERSION  02/27/2011   Procedure: CARDIOVERSION;  Surgeon: Loralie Champagne, MD;  Location: [facility];  Service: Cardiovascular;  Laterality: N/A;  . LAPAROSCOPIC CHOLECYSTECTOMY    . ROTATOR CUFF REPAIR Left   .  TUBAL LIGATION      OB History    No data available       Home Medications    Prior to Admission medications   Medication Sig Start Date End Date Taking? Authorizing Provider  cyanocobalamin 1000 MCG tablet Take 1,000 mcg by mouth daily.    Yes Historical Provider, MD  cyclobenzaprine (FLEXERIL) 10 MG tablet Take 10 mg by mouth 3 (three) times daily as needed for muscle spasms. 01/24/16  Yes Historical Provider, MD  ferrous sulfate 325 (65 FE) MG tablet Take 325 mg by mouth daily with breakfast.     Yes Historical Provider, MD  glimepiride (AMARYL) 2 MG tablet Take 2 mg by mouth daily with breakfast.  09/11/11  Yes Historical Provider, MD  HYDROcodone-acetaminophen (NORCO/VICODIN) 5-325 MG tablet Take 1-2 tablets by mouth every 4 (four) hours as needed for moderate pain. 01/20/16  Yes Autumn Messing III, MD  levothyroxine (SYNTHROID, LEVOTHROID) 125 MCG tablet Take 125 mcg by mouth daily.     Yes Historical Provider, MD  losartan (COZAAR) 50 MG tablet Take 50 mg by mouth daily. 10/18/13  Yes Historical Provider, MD  metFORMIN (GLUCOPHAGE) 850 MG tablet Take 850 mg by mouth 2 (two) times daily with a meal.     Yes Historical Provider, MD  metoprolol succinate (TOPROL-XL) 50 MG 24 hr tablet Take 1 tablet (50 mg total) by  mouth daily. 03/18/15  Yes Burnell Blanks, MD  Multiple Vitamins-Minerals (MULTIVITAMIN WITH MINERALS) tablet Take 1 tablet by mouth daily.     Yes Historical Provider, MD  MYRBETRIQ 50 MG TB24 tablet Take 50 mg by mouth daily. 03/03/15  Yes Historical Provider, MD  rosuvastatin (CRESTOR) 10 MG tablet Take 5 mg by mouth every evening.    Yes Historical Provider, MD  TOUJEO SOLOSTAR 300 UNIT/ML SOPN Inject 16 Units into the skin daily.  03/12/15  Yes Historical Provider, MD  trimethoprim (TRIMPEX) 100 MG tablet Take 100 mg by mouth daily. 03/13/15  Yes Historical Provider, MD  warfarin (COUMADIN) 5 MG tablet Take 2.5-5 mg by mouth See admin instructions. Take 2.5 mg by mouth  daily on Monday and Friday. Take 5 mg by mouth on all other days   Yes Historical Provider, MD    Family History Family History  Problem Relation Age of Onset  . Heart failure Father 90    enlarged heart  . Pneumonia Mother 43  . Diabetes Mother 29  . Breast cancer Sister   . Cancer Neg Hx     Social History Social History  Substance Use Topics  . Smoking status: Never Smoker  . Smokeless tobacco: Never Used  . Alcohol use No     Allergies   Bee venom and Penicillins   Review of Systems Review of Systems  All other systems reviewed and are negative.    Physical Exam Updated Vital Signs BP 162/100 (BP Location: Right Arm)   Pulse 101   Temp 98.3 F (36.8 C) (Oral)   Resp 18   Ht 5\' 4"  (1.626 m)   LMP  (LMP Unknown)   SpO2 96%   Physical Exam  Constitutional: She is oriented to person, place, and time. She appears well-developed and well-nourished.  Non-toxic appearance. No distress.  HENT:  Head: Normocephalic and atraumatic.  Eyes: Conjunctivae, EOM and lids are normal. Pupils are equal, round, and reactive to light.  Neck: Normal range of motion. Neck supple. No tracheal deviation present. No thyroid mass present.  Cardiovascular: Normal rate, regular rhythm and normal heart sounds.  Exam reveals no gallop.   No murmur heard. Pulmonary/Chest: Effort normal and breath sounds normal. No stridor. No respiratory distress. She has no decreased breath sounds. She has no wheezes. She has no rhonchi. She has no rales.  Abdominal: Soft. Normal appearance and bowel sounds are normal. She exhibits no distension. There is no tenderness. There is no rebound and no CVA tenderness.  Musculoskeletal: Normal range of motion. She exhibits no edema or tenderness.       Arms: Neurological: She is alert and oriented to person, place, and time. She has normal strength. No cranial nerve deficit or sensory deficit. GCS eye subscore is 4. GCS verbal subscore is 5. GCS motor subscore  is 6.  Skin: Skin is warm and dry. No abrasion and no rash noted.  Psychiatric: She has a normal mood and affect. Her speech is normal and behavior is normal.  Nursing note and vitals reviewed.    ED Treatments / Results  Labs (all labs ordered are listed, but only abnormal results are displayed) Labs Reviewed - No data to display  EKG  EKG Interpretation None       Radiology No results found.  Procedures Procedures (including critical care time)  Medications Ordered in ED Medications  diazepam (VALIUM) tablet 2 mg (not administered)  HYDROcodone-acetaminophen (NORCO/VICODIN) 5-325 MG per tablet 1 tablet (not  administered)     Initial Impression / Assessment and Plan / ED Course  I have reviewed the triage vital signs and the nursing notes.  Pertinent labs & imaging results that were available during my care of the patient were reviewed by me and considered in my medical decision making (see chart for details).  Clinical Course     Patient likely muscular skeletal pain. Does not complain of being short of breath. Rashes noted. She has diffuse tenderness at her lateral right thoracic spine. Have offered her blood work to assess her potassium and calcium which she has deferred. Treated for pain here and she will follow-up with her doctor  Final Clinical Impressions(s) / ED Diagnoses   Final diagnoses:  None    New Prescriptions New Prescriptions   No medications on file     Lacretia Leigh, MD 01/29/16 [id number]

## 2016-01-31 ENCOUNTER — Encounter (HOSPITAL_COMMUNITY): Payer: Medicare Other

## 2016-02-02 DIAGNOSIS — C50412 Malignant neoplasm of upper-outer quadrant of left female breast: Secondary | ICD-10-CM | POA: Diagnosis not present

## 2016-02-04 ENCOUNTER — Telehealth: Payer: Self-pay | Admitting: *Deleted

## 2016-02-04 ENCOUNTER — Encounter (HOSPITAL_COMMUNITY): Payer: Self-pay

## 2016-02-04 NOTE — Telephone Encounter (Signed)
Received oncotype score of 8/6% Gave pt results and discussed she does NOT need chemo Confirmed appt with Dr Lindi Adie on 02/07/16 and that she will receive a call for Dr Clabe Seal appt for xrt. denies questions or needs.

## 2016-02-07 ENCOUNTER — Telehealth: Payer: Self-pay | Admitting: Hematology and Oncology

## 2016-02-07 ENCOUNTER — Encounter: Payer: Self-pay | Admitting: Hematology and Oncology

## 2016-02-07 ENCOUNTER — Ambulatory Visit (HOSPITAL_BASED_OUTPATIENT_CLINIC_OR_DEPARTMENT_OTHER): Payer: Medicare Other | Admitting: Hematology and Oncology

## 2016-02-07 DIAGNOSIS — C50412 Malignant neoplasm of upper-outer quadrant of left female breast: Secondary | ICD-10-CM

## 2016-02-07 DIAGNOSIS — Z170 Estrogen receptor positive status [ER+]: Secondary | ICD-10-CM | POA: Diagnosis not present

## 2016-02-07 MED ORDER — ANASTROZOLE 1 MG PO TABS: 1.0000 mg | ORAL_TABLET | Freq: Every day | ORAL | 3 refills | Status: DC

## 2016-02-07 NOTE — Telephone Encounter (Signed)
Gave patient avs report and appointments for April  °

## 2016-02-07 NOTE — Addendum Note (Signed)
Addended by: Thelma Barge MAY J on: 02/07/2016 05:11 PM   Modules accepted: Orders

## 2016-02-07 NOTE — Progress Notes (Signed)
Patient Care Team: Haywood Pao, MD as PCP - General (Internal Medicine)  DIAGNOSIS:  Encounter Diagnosis  Name Primary?  . Malignant neoplasm of upper-outer quadrant of left breast in female, estrogen receptor positive ([place])     SUMMARY OF ONCOLOGIC HISTORY:   Breast cancer of upper-outer quadrant of left female breast ([place])   12/15/2015 Initial Diagnosis    Left breast biopsy: Grade 1-2 IDC with calcifications with DCIS, ER 100%, PR 90%, Ki-67 5%, HER-2 negative ratio 1.45; left breast mass 1.9 x 1 x 1.1 cm; T1 CN 0 stage IA clinical stage      01/19/2016 Surgery    Left lumpectomy: IDC with calcifications, 1.8 cm, grade 2, DCIS with calcifications, 0/1 lymph node negative, T1 CN 0 stage IA      01/26/2016 Oncotype testing    Oncotype DX score 8: risk of recurrence 6%       CHIEF COMPLIANT: Follow-up to discuss Oncotype DX score  INTERVAL HISTORY: Summer Hawkins is a 81 year old with above-mentioned history of left breast cancer treated with lumpectomy. She had Oncotype DX testing which revealed low risk of recurrence. She is here to discuss the results and to discuss the follow-up plan. She is healing very well from the surgery.  REVIEW OF SYSTEMS:   Constitutional: Denies fevers, chills or abnormal weight loss Eyes: Denies blurriness of vision Ears, nose, mouth, throat, and face: Denies mucositis or sore throat Respiratory: Denies cough, dyspnea or wheezes Cardiovascular: Denies palpitation, chest discomfort Gastrointestinal:  Denies nausea, heartburn or change in bowel habits Skin: Denies abnormal skin rashes Lymphatics: Denies new lymphadenopathy or easy bruising Neurological:Denies numbness, tingling or new weaknesses Behavioral/Psych: Mood is stable, no new changes  Extremities: No lower extremity edema Breast:  denies any pain or lumps or nodules in either breasts All other systems were reviewed with the patient and are negative.  I have reviewed the past  medical history, past surgical history, social history and family history with the patient and they are unchanged from previous note.  ALLERGIES:  is allergic to bee venom and penicillins.  MEDICATIONS:  Current Outpatient Prescriptions  Medication Sig Dispense Refill  . [START ON 04/13/2016] anastrozole (ARIMIDEX) 1 MG tablet Take 1 tablet (1 mg total) by mouth daily. 90 tablet 3  . cyanocobalamin 1000 MCG tablet Take 1,000 mcg by mouth daily.     . cyclobenzaprine (FLEXERIL) 10 MG tablet Take 10 mg by mouth 3 (three) times daily as needed for muscle spasms.    . diazepam (VALIUM) 2 MG tablet Take 1 tablet (2 mg total) by mouth every 6 (six) hours as needed for anxiety. 15 tablet 0  . ferrous sulfate 325 (65 FE) MG tablet Take 325 mg by mouth daily with breakfast.      . glimepiride (AMARYL) 2 MG tablet Take 2 mg by mouth daily with breakfast.     . levothyroxine (SYNTHROID, LEVOTHROID) 125 MCG tablet Take 125 mcg by mouth daily.      Marland Kitchen losartan (COZAAR) 50 MG tablet Take 50 mg by mouth daily.    . metFORMIN (GLUCOPHAGE) 850 MG tablet Take 850 mg by mouth 2 (two) times daily with a meal.      . metoprolol succinate (TOPROL-XL) 50 MG 24 hr tablet Take 1 tablet (50 mg total) by mouth daily. 30 tablet 11  . Multiple Vitamins-Minerals (MULTIVITAMIN WITH MINERALS) tablet Take 1 tablet by mouth daily.      Marland Kitchen MYRBETRIQ 50 MG TB24 tablet Take 50  mg by mouth daily.  11  . rosuvastatin (CRESTOR) 10 MG tablet Take 5 mg by mouth every evening.     Marland Kitchen TOUJEO SOLOSTAR 300 UNIT/ML SOPN Inject 16 Units into the skin daily.   1  . trimethoprim (TRIMPEX) 100 MG tablet Take 100 mg by mouth daily.  9  . warfarin (COUMADIN) 5 MG tablet Take 2.5-5 mg by mouth See admin instructions. Take 2.5 mg by mouth daily on Monday and Friday. Take 5 mg by mouth on all other days     No current facility-administered medications for this visit.     PHYSICAL EXAMINATION: ECOG PERFORMANCE STATUS: 0 - Asymptomatic  Vitals:    02/07/16 1514  BP: (!) 179/124  Pulse: 93  Resp: 19  Temp: 98.3 F (36.8 C)   Filed Weights   02/07/16 1514  Weight: 242 lb (109.8 kg)    GENERAL:alert, no distress and comfortable SKIN: skin color, texture, turgor are normal, no rashes or significant lesions EYES: normal, Conjunctiva are pink and non-injected, sclera clear OROPHARYNX:no exudate, no erythema and lips, buccal mucosa, and tongue normal  NECK: supple, thyroid normal size, non-tender, without nodularity LYMPH:  no palpable lymphadenopathy in the cervical, axillary or inguinal LUNGS: clear to auscultation and percussion with normal breathing effort HEART: regular rate & rhythm and no murmurs and no lower extremity edema ABDOMEN:abdomen soft, non-tender and normal bowel sounds MUSCULOSKELETAL:no cyanosis of digits and no clubbing  NEURO: alert & oriented x 3 with fluent speech, no focal motor/sensory deficits EXTREMITIES: No lower extremity edema  LABORATORY DATA:  I have reviewed the data as listed   Chemistry      Component Value Date/Time   NA 138 01/06/2016 1533   K 4.2 01/06/2016 1533   CL 106 04/14/2014 0006   CO2 24 01/06/2016 1533   BUN 16.8 01/06/2016 1533   CREATININE 0.9 01/06/2016 1533      Component Value Date/Time   CALCIUM 9.1 01/06/2016 1533   ALKPHOS 103 01/06/2016 1533   AST 21 01/06/2016 1533   ALT 26 01/06/2016 1533   BILITOT 0.50 01/06/2016 1533       Lab Results  Component Value Date   WBC 11.0 (H) 01/06/2016   HGB 13.9 01/06/2016   HCT 41.9 01/06/2016   MCV 93.1 01/06/2016   PLT 246 01/06/2016   NEUTROABS 6.2 01/06/2016    ASSESSMENT & PLAN:  Breast cancer of upper-outer quadrant of left female breast ([place]) 12/10/2015: Left breast biopsy: Grade 1-2 IDC with calcifications with DCIS, ER 100%, PR 90%, Ki-67 5%, HER-2 negative ratio 1.45; left breast mass 1.9 x 1 x 1.1 cm; T1 CN 0 stage IA clinical stage 01/19/2016 Left lumpectomy: IDC with calcifications, 1.8 cms, grade 2,  DCIS with calcifications, 0/1 lymph node negative, T1 CN 0 stage IA Oncotype DX score 8: Risk of recurrence 6%  Recommendation: No role of systemic chemotherapy 1. Adjuvant radiation therapy followed by 2. adjuvant antiestrogen therapy with anastrozole 1 mg by mouth daily 5 years, this will start approximately in February 2018.  Anastrozole counseling: We discussed the risks and benefits of anti-estrogen therapy with aromatase inhibitors. These include but not limited to insomnia, hot flashes, mood changes, vaginal dryness, bone density loss, and weight gain. We strongly believe that the benefits far outweigh the risks. Patient understands these risks and consented to starting treatment. Planned treatment duration is 5 years.  I would like to see her back in April 2018 to discuss tolerability to antiestrogen therapy.  No orders of the defined types were placed in this encounter.  The patient has a good understanding of the overall plan. she agrees with it. she will call with any problems that may develop before the next visit here.   Rulon Eisenmenger, MD 02/07/16

## 2016-02-07 NOTE — Progress Notes (Signed)
Sent scheduling msg for pt to set up radiation treatment appt. Sent referral to radonc and in basket msg sent to Dr. Clabe Seal nurse.

## 2016-02-07 NOTE — Assessment & Plan Note (Signed)
12/10/2015: Left breast biopsy: Grade 1-2 IDC with calcifications with DCIS, ER 100%, PR 90%, Ki-67 5%, HER-2 negative ratio 1.45; left breast mass 1.9 x 1 x 1.1 cm; T1 CN 0 stage IA clinical stage 01/19/2016 Left lumpectomy: IDC with calcifications, 1.8 cinnamon is, grade 2, DCIS with calcifications, 0/1 lymph node negative, T1 CN 0 stage IA Oncotype DX score 8: Risk of recurrence 6%  Recommendation: No role of systemic chemotherapy 1. Adjuvant radiation therapy followed by 2. adjuvant antiestrogen therapy with anastrozole 1 mg by mouth daily 5 years  Return to clinic in 3 months to start antiestrogen therapy.

## 2016-02-10 DIAGNOSIS — I480 Paroxysmal atrial fibrillation: Secondary | ICD-10-CM | POA: Diagnosis not present

## 2016-02-10 DIAGNOSIS — Z7901 Long term (current) use of anticoagulants: Secondary | ICD-10-CM | POA: Diagnosis not present

## 2016-02-11 NOTE — Progress Notes (Signed)
Location of Breast Cancer: left breast cancer  Histology per Pathology Report:   01/19/16 FINAL DIAGNOSIS Diagnosis 1. Breast, lumpectomy, Left INVASIVE DUCTAL CARCINOMA WITH CALCIFICATIONS (1.8 CM, GRADE 2) CARCINOMA IN SITU WITH CALCIFICATIONS ALL MARGINS OF RESECTION ARE NEGATIVE FOR CARCINOMA 2. Lymph node, sentinel, biopsy, Left axillary #1 ONE LYMPH NODE, NEGATIVE FOR CARCINOMA (0/1)  12/15/15 Diagnosis Breast, left, needle core biopsy, outer breast - INVASIVE DUCTAL CARCINOMA WITH CALCIFICATIONS. - DUCTAL CARCINOMA IN SITU. - SEE COMMENT.  Receptor Status: ER(100%), PR (90%), Her2-neu (neg), Ki-(5%)  Did patient present with symptoms (if so, please note symptoms) or was this found on screening mammography?: screening mammogram  Past/Anticipated interventions by surgeon, if any: 01/19/16 - Procedure: LEFT BREAST LUMPECTOMY WITH RADIOACTIVE SEED AND SENTINEL LYMPH NODE BIOPSY;  Surgeon: Autumn Messing III, MD  Past/Anticipated interventions by medical oncology, if any: Plan per Dr. Lindi Adie is "No role of systemic chemotherapy. Adjuvant radiation therapy followed by adjuvant antiestrogen therapy with anastrozole 1 mg by mouth daily 5 years, this will start approximately in February 2018."   Lymphedema issues, if any: no  Pain issues, if any:  no   OB Gyn history: patient was 65 with her first menses.  Patient has 2 children.  She was 22 at birth of first child.  She used birth control pills for 15 years.  She used hormone replacement for 15 years.  She has a sister with breast cancer.  SAFETY ISSUES:  Prior radiation? no  Pacemaker/ICD? no  Possible current pregnancy?no  Is the patient on methotrexate? no  Current Complaints / other details:    BP (!) 144/81 (BP Location: Right Arm, Patient Position: Sitting)   Pulse (!) 107   Temp 98.6 F (37 C) (Oral)   Ht '5\' 4"'$  (1.626 m)   Wt 238 lb 9.6 oz (108.2 kg)   LMP  (LMP Unknown)   SpO2 98%   BMI 40.96 kg/m    Wt  Readings from Last 3 Encounters:  02/16/16 238 lb 9.6 oz (108.2 kg)  02/07/16 242 lb (109.8 kg)  01/19/16 240 lb (108.9 kg)      Jacqulyn Liner, RN 02/11/2016,9:47 AM

## 2016-02-16 ENCOUNTER — Telehealth: Payer: Self-pay | Admitting: *Deleted

## 2016-02-16 ENCOUNTER — Encounter: Payer: Self-pay | Admitting: Radiation Oncology

## 2016-02-16 ENCOUNTER — Ambulatory Visit
Admission: RE | Admit: 2016-02-16 | Discharge: 2016-02-16 | Disposition: A | Discharge status: Home / Self Care | Payer: Medicare Other | Source: Ambulatory Visit | Attending: Radiation Oncology | Admitting: Radiation Oncology

## 2016-02-16 DIAGNOSIS — I4891 Unspecified atrial fibrillation: Secondary | ICD-10-CM | POA: Diagnosis not present

## 2016-02-16 DIAGNOSIS — Z170 Estrogen receptor positive status [ER+]: Secondary | ICD-10-CM | POA: Diagnosis not present

## 2016-02-16 DIAGNOSIS — C50412 Malignant neoplasm of upper-outer quadrant of left female breast: Secondary | ICD-10-CM

## 2016-02-16 DIAGNOSIS — I10 Essential (primary) hypertension: Secondary | ICD-10-CM | POA: Diagnosis not present

## 2016-02-16 DIAGNOSIS — Z9889 Other specified postprocedural states: Secondary | ICD-10-CM | POA: Diagnosis not present

## 2016-02-16 DIAGNOSIS — E785 Hyperlipidemia, unspecified: Secondary | ICD-10-CM | POA: Diagnosis not present

## 2016-02-16 DIAGNOSIS — E119 Type 2 diabetes mellitus without complications: Secondary | ICD-10-CM | POA: Diagnosis not present

## 2016-02-16 NOTE — Telephone Encounter (Signed)
[id number] phone/left message to call for any needs or querstions about recent outpt therapies and surgery/Rhonda Davis,BSN,RN3,CCM

## 2016-02-16 NOTE — Progress Notes (Signed)
Radiation Oncology         [phone] ________________________________  Name: Summer Hawkins MRN: [id number]  Date: 02/16/2016  DOB: March 16, 1934  Re-Evaluation Visit Note  CC: Haywood Pao, MD  Nicholas Lose, MD    ICD-9-CM ICD-10-CM   1. Malignant neoplasm of upper-outer quadrant of left breast in female, estrogen receptor positive ([facility]) 174.4 C50.412    V86.0 Z17.0     Diagnosis: Stage IA (pT1c, pN0) grade 2 invasive ductal carcinoma of the left breast (ER/PR+, HER2-)  Interval Since Last Radiation:  N/A  Narrative:  The patient returns today for a re-evaluation. The patient's initial consultation with radiation oncology was on 01/05/16.  The patient had surgery consisting of a left lumpectomy and sentinel lymph node biopsy on 01/19/16. This revealed grade 2 invasive ductal carcinoma with calcifications measuring 1.8 cm and clear margins, intermediate grade DCIS, and 0/1 left axillary sentinel lymph nodes were negative.  Oncotype DX score was 8; low risk. Therefore chemotherapy was not recommended.  The patient presents today to discuss the role of radiation for the management of her disease.  On review of systems, the patient denies lymphedema or pain.  ALLERGIES:  is allergic to bee venom and penicillins.  Meds: Current Outpatient Prescriptions  Medication Sig Dispense Refill  . [START ON 04/13/2016] anastrozole (ARIMIDEX) 1 MG tablet Take 1 tablet (1 mg total) by mouth daily. 90 tablet 3  . cyanocobalamin 1000 MCG tablet Take 1,000 mcg by mouth daily.     . cyclobenzaprine (FLEXERIL) 10 MG tablet Take 10 mg by mouth 3 (three) times daily as needed for muscle spasms.    . ferrous sulfate 325 (65 FE) MG tablet Take 325 mg by mouth daily with breakfast.      . glimepiride (AMARYL) 2 MG tablet Take 2 mg by mouth daily with breakfast.     . levothyroxine (SYNTHROID, LEVOTHROID) 125 MCG tablet Take 125 mcg by mouth daily.      Marland Kitchen losartan (COZAAR) 50 MG tablet Take 50 mg by  mouth daily.    . metFORMIN (GLUCOPHAGE) 850 MG tablet Take 850 mg by mouth 2 (two) times daily with a meal.      . metoprolol succinate (TOPROL-XL) 50 MG 24 hr tablet Take 1 tablet (50 mg total) by mouth daily. 30 tablet 11  . Multiple Vitamins-Minerals (MULTIVITAMIN WITH MINERALS) tablet Take 1 tablet by mouth daily.      Marland Kitchen MYRBETRIQ 50 MG TB24 tablet Take 50 mg by mouth daily.  11  . rosuvastatin (CRESTOR) 10 MG tablet Take 5 mg by mouth every evening.     Marland Kitchen TOUJEO SOLOSTAR 300 UNIT/ML SOPN Inject 16 Units into the skin daily.   1  . warfarin (COUMADIN) 5 MG tablet Take 2.5-5 mg by mouth See admin instructions. Take 2.5 mg by mouth daily on Monday and Friday. Take 5 mg by mouth on all other days    . diazepam (VALIUM) 2 MG tablet Take 1 tablet (2 mg total) by mouth every 6 (six) hours as needed for anxiety. (Patient not taking: Reported on 02/16/2016) 15 tablet 0  . trimethoprim (TRIMPEX) 100 MG tablet Take 100 mg by mouth daily.  9   No current facility-administered medications for this encounter.     Physical Findings: The patient is in no acute distress. Patient is alert and oriented.  height is '5\' 4"'$  (1.626 m) and weight is 238 lb 9.6 oz (108.2 kg). Her oral temperature is 98.6 F (37 C).  Her blood pressure is 144/81 (abnormal) and her pulse is 107 (abnormal). Her oxygen saturation is 98%.   Lungs are clear to auscultation bilaterally. Heart has regular rate and rhythm. No palpable cervical, supraclavicular, or axillary adenopathy. Right breast no palpable mass or nipple discharge. Left breast one scar in the UOQ, no palpable mass or nipple discharge.  Lab Findings: Lab Results  Component Value Date   WBC 11.0 (H) 01/06/2016   HGB 13.9 01/06/2016   HCT 41.9 01/06/2016   MCV 93.1 01/06/2016   PLT 246 01/06/2016    Radiographic Findings: Mm Breast Surgical Specimen  Result Date: 01/19/2016 CLINICAL DATA:  Post surgical removal of left breast lesion. EXAM: SPECIMEN RADIOGRAPH OF  THE LEFT BREAST COMPARISON:  Previous exam(s). FINDINGS: Status post excision of the left breast. The radioactive seed and biopsy marker clip are present, completely intact, and were marked for pathology. IMPRESSION: Specimen radiograph of the left breast. Electronically Signed   By: Altamese Cabal M.D.   On: 01/19/2016 09:30    Impression:  Stage IA (pT1c, pN0) grade 2 invasive ductal carcinoma of the left breast (ER/PR+, HER2-)  The patient has clear margins, a small tumor size, negative lymph nodes, and agrees to take an aromatase inhibitor.   I spoke to the patient today regarding her diagnosis and options for treatment. We discussed the equivalence in terms of survival and local failure between mastectomy and breast conservation. We discussed the role of radiation in decreasing local failures in patients who undergo lumpectomy. We discussed the low rates of failure in patients her age with breast cancers. We discussed the randomized trials in elderly women showing equivalent survival when omitting radiaton and taking an AI instead. We discussed the randomized trials showing a slight improvement in local control with the combination. We discussed the differences between systemic and local treatment. We discussed the process of simulation and the placement tattoos. We discussed the possibility of asymptomatic lung damage. We discussed the low likelihood of secondary malignancies. We discussed the possible side effects including but not limited to skin redness, fatigue, permanent skin darkening, and breast swelling.  Plan: The patient has ultimately decided not to undergo radiation therapy and will undergo antiestrogen therapy under Dr. Lindi Adie. This seems very reasonable given the patient's age and stage as well as tumor characteristics. She is comfortable with this decision. ____________________________________ -----------------------------------  Blair Promise, PhD, MD  This document serves  as a record of services personally performed by Gery Pray, MD. It was created on his behalf by Darcus Austin, a trained medical scribe. The creation of this record is based on the scribe's personal observations and the provider's statements to them. This document has been checked and approved by the attending provider.

## 2016-02-16 NOTE — Progress Notes (Signed)
Please see the Nurse Progress Note in the MD Initial Consult Encounter for this patient. 

## 2016-02-17 ENCOUNTER — Ambulatory Visit: Payer: Self-pay | Admitting: Radiation Oncology

## 2016-03-14 DIAGNOSIS — E538 Deficiency of other specified B group vitamins: Secondary | ICD-10-CM | POA: Diagnosis not present

## 2016-03-14 DIAGNOSIS — E1129 Type 2 diabetes mellitus with other diabetic kidney complication: Secondary | ICD-10-CM | POA: Diagnosis not present

## 2016-03-14 DIAGNOSIS — I10 Essential (primary) hypertension: Secondary | ICD-10-CM | POA: Diagnosis not present

## 2016-03-14 DIAGNOSIS — E038 Other specified hypothyroidism: Secondary | ICD-10-CM | POA: Diagnosis not present

## 2016-03-17 ENCOUNTER — Encounter: Payer: Self-pay | Admitting: Radiology

## 2016-03-21 DIAGNOSIS — Z7901 Long term (current) use of anticoagulants: Secondary | ICD-10-CM | POA: Diagnosis not present

## 2016-03-21 DIAGNOSIS — N183 Chronic kidney disease, stage 3 (moderate): Secondary | ICD-10-CM | POA: Diagnosis not present

## 2016-03-21 DIAGNOSIS — I129 Hypertensive chronic kidney disease with stage 1 through stage 4 chronic kidney disease, or unspecified chronic kidney disease: Secondary | ICD-10-CM | POA: Diagnosis not present

## 2016-03-21 DIAGNOSIS — Z6839 Body mass index (BMI) 39.0-39.9, adult: Secondary | ICD-10-CM | POA: Diagnosis not present

## 2016-03-21 DIAGNOSIS — E11319 Type 2 diabetes mellitus with unspecified diabetic retinopathy without macular edema: Secondary | ICD-10-CM | POA: Diagnosis not present

## 2016-03-21 DIAGNOSIS — C50919 Malignant neoplasm of unspecified site of unspecified female breast: Secondary | ICD-10-CM | POA: Diagnosis not present

## 2016-03-21 DIAGNOSIS — E1129 Type 2 diabetes mellitus with other diabetic kidney complication: Secondary | ICD-10-CM | POA: Diagnosis not present

## 2016-03-21 DIAGNOSIS — I480 Paroxysmal atrial fibrillation: Secondary | ICD-10-CM | POA: Diagnosis not present

## 2016-03-21 DIAGNOSIS — Z0000 Encounter for general adult medical examination without abnormal findings: Secondary | ICD-10-CM | POA: Diagnosis not present

## 2016-03-21 DIAGNOSIS — Z1389 Encounter for screening for other disorder: Secondary | ICD-10-CM | POA: Diagnosis not present

## 2016-03-21 DIAGNOSIS — R808 Other proteinuria: Secondary | ICD-10-CM | POA: Diagnosis not present

## 2016-04-17 ENCOUNTER — Other Ambulatory Visit: Payer: Self-pay | Admitting: Cardiovascular Disease

## 2016-04-17 DIAGNOSIS — N3941 Urge incontinence: Secondary | ICD-10-CM | POA: Diagnosis not present

## 2016-04-17 DIAGNOSIS — N3020 Other chronic cystitis without hematuria: Secondary | ICD-10-CM | POA: Diagnosis not present

## 2016-04-18 DIAGNOSIS — Z7901 Long term (current) use of anticoagulants: Secondary | ICD-10-CM | POA: Diagnosis not present

## 2016-04-18 DIAGNOSIS — Z780 Asymptomatic menopausal state: Secondary | ICD-10-CM | POA: Diagnosis not present

## 2016-04-18 DIAGNOSIS — E538 Deficiency of other specified B group vitamins: Secondary | ICD-10-CM | POA: Diagnosis not present

## 2016-04-25 DIAGNOSIS — E113293 Type 2 diabetes mellitus with mild nonproliferative diabetic retinopathy without macular edema, bilateral: Secondary | ICD-10-CM | POA: Diagnosis not present

## 2016-04-25 DIAGNOSIS — H353131 Nonexudative age-related macular degeneration, bilateral, early dry stage: Secondary | ICD-10-CM | POA: Diagnosis not present

## 2016-04-28 DIAGNOSIS — N3941 Urge incontinence: Secondary | ICD-10-CM | POA: Diagnosis not present

## 2016-04-28 DIAGNOSIS — R350 Frequency of micturition: Secondary | ICD-10-CM | POA: Diagnosis not present

## 2016-05-02 DIAGNOSIS — Z170 Estrogen receptor positive status [ER+]: Secondary | ICD-10-CM | POA: Diagnosis not present

## 2016-05-02 DIAGNOSIS — C50412 Malignant neoplasm of upper-outer quadrant of left female breast: Secondary | ICD-10-CM | POA: Diagnosis not present

## 2016-05-04 DIAGNOSIS — Z7901 Long term (current) use of anticoagulants: Secondary | ICD-10-CM | POA: Diagnosis not present

## 2016-05-04 DIAGNOSIS — I480 Paroxysmal atrial fibrillation: Secondary | ICD-10-CM | POA: Diagnosis not present

## 2016-05-09 ENCOUNTER — Telehealth: Payer: Self-pay | Admitting: Hematology and Oncology

## 2016-05-09 NOTE — Telephone Encounter (Signed)
Left message for patient for June appt for SCP. Sent letter in the mail as a reminder. 05/09/2016

## 2016-05-17 DIAGNOSIS — N3941 Urge incontinence: Secondary | ICD-10-CM | POA: Diagnosis not present

## 2016-05-17 DIAGNOSIS — N3020 Other chronic cystitis without hematuria: Secondary | ICD-10-CM | POA: Diagnosis not present

## 2016-05-18 ENCOUNTER — Ambulatory Visit (HOSPITAL_BASED_OUTPATIENT_CLINIC_OR_DEPARTMENT_OTHER): Payer: Medicare Other | Admitting: Hematology and Oncology

## 2016-05-18 ENCOUNTER — Encounter: Payer: Self-pay | Admitting: Hematology and Oncology

## 2016-05-18 DIAGNOSIS — Z7901 Long term (current) use of anticoagulants: Secondary | ICD-10-CM | POA: Diagnosis not present

## 2016-05-18 DIAGNOSIS — Z79811 Long term (current) use of aromatase inhibitors: Secondary | ICD-10-CM

## 2016-05-18 DIAGNOSIS — Z170 Estrogen receptor positive status [ER+]: Secondary | ICD-10-CM

## 2016-05-18 DIAGNOSIS — C50412 Malignant neoplasm of upper-outer quadrant of left female breast: Secondary | ICD-10-CM | POA: Diagnosis not present

## 2016-05-18 DIAGNOSIS — N951 Menopausal and female climacteric states: Secondary | ICD-10-CM | POA: Diagnosis not present

## 2016-05-18 DIAGNOSIS — Z171 Estrogen receptor negative status [ER-]: Secondary | ICD-10-CM | POA: Diagnosis not present

## 2016-05-18 DIAGNOSIS — I480 Paroxysmal atrial fibrillation: Secondary | ICD-10-CM | POA: Diagnosis not present

## 2016-05-18 NOTE — Assessment & Plan Note (Signed)
12/10/2015: Left breast biopsy: Grade 1-2 IDC with calcifications with DCIS, ER 100%, PR 90%, Ki-67 5%, HER-2 negative ratio 1.45; left breast mass 1.9 x 1 x 1.1 cm; T1 CN 0 stage IA clinical stage 01/19/2016 Left lumpectomy: IDC with calcifications, 1.8 cms, grade 2, DCIS with calcifications, 0/1 lymph node negative, T1 CN 0 stage IA Oncotype DX score 8: Risk of recurrence 6%  Recommendation: No role of systemic chemotherapy 1. Adjuvant radiation therapy followed by 2. adjuvant antiestrogen therapy with anastrozole 1 mg by mouth daily 5 years, started February 2018.  Anastrozole toxicities:  Return to clinic in 6 months for follow-up

## 2016-05-18 NOTE — Progress Notes (Signed)
Patient Care Team: Haywood Pao, MD as PCP - General (Internal Medicine)  DIAGNOSIS:  Encounter Diagnosis  Name Primary?  . Malignant neoplasm of upper-outer quadrant of left breast in female, estrogen receptor positive ([facility])     SUMMARY OF ONCOLOGIC HISTORY:   Breast cancer of upper-outer quadrant of left female breast ([place])   12/15/2015 Initial Diagnosis    Left breast biopsy: Grade 1-2 IDC with calcifications with DCIS, ER 100%, PR 90%, Ki-67 5%, HER-2 negative ratio 1.45; left breast mass 1.9 x 1 x 1.1 cm; T1 CN 0 stage IA clinical stage      01/19/2016 Surgery    Left lumpectomy: IDC with calcifications, 1.8 cm, grade 2, DCIS with calcifications, 0/1 lymph node negative, T1 CN 0 stage IA      01/26/2016 Oncotype testing    Oncotype DX score 8: risk of recurrence 6%       Radiation Therapy    Refused radiation therapy      04/13/2016 -  Anti-estrogen oral therapy    Anastrozole 1 mg daily       CHIEF COMPLIANT: Follow-up on anastrozole therapy  INTERVAL HISTORY: Summer Hawkins is a 82-year-old with above-mentioned history of left breast cancer underwent lumpectomy followed by anastrozole therapy. She did not want to undergo radiation. She is tolerating anastrozole extremely well. She has very occasional hot flashes. Denies myalgias or arthralgias. She is very independent and can take care of herself. She drives as well.  REVIEW OF SYSTEMS:   Constitutional: Denies fevers, chills or abnormal weight loss Eyes: Denies blurriness of vision Ears, nose, mouth, throat, and face: Denies mucositis or sore throat Respiratory: Denies cough, dyspnea or wheezes Cardiovascular: Denies palpitation, chest discomfort Gastrointestinal:  Denies nausea, heartburn or change in bowel habits Skin: Denies abnormal skin rashes Lymphatics: Denies new lymphadenopathy or easy bruising Neurological:Denies numbness, tingling or new weaknesses Behavioral/Psych: Mood is stable, no new changes   Extremities: No lower extremity edema Breast:  denies any pain or lumps or nodules in either breasts All other systems were reviewed with the patient and are negative.  I have reviewed the past medical history, past surgical history, social history and family history with the patient and they are unchanged from previous note.  ALLERGIES:  is allergic to bee venom and penicillins.  MEDICATIONS:  Current Outpatient Prescriptions  Medication Sig Dispense Refill  . anastrozole (ARIMIDEX) 1 MG tablet Take 1 tablet (1 mg total) by mouth daily. 90 tablet 3  . cyanocobalamin 1000 MCG tablet Take 1,000 mcg by mouth daily.     . ferrous sulfate 325 (65 FE) MG tablet Take 325 mg by mouth daily with breakfast.      . glimepiride (AMARYL) 2 MG tablet Take 2 mg by mouth daily with breakfast.     . levothyroxine (SYNTHROID, LEVOTHROID) 125 MCG tablet Take 125 mcg by mouth daily.      Marland Kitchen losartan (COZAAR) 50 MG tablet Take 50 mg by mouth daily.    . metFORMIN (GLUCOPHAGE) 850 MG tablet Take 850 mg by mouth 2 (two) times daily with a meal.      . metoprolol succinate (TOPROL-XL) 50 MG 24 hr tablet TAKE 1 TABLET(50 MG) BY MOUTH DAILY 30 tablet 1  . Multiple Vitamins-Minerals (MULTIVITAMIN WITH MINERALS) tablet Take 1 tablet by mouth daily.      Marland Kitchen MYRBETRIQ 50 MG TB24 tablet Take 50 mg by mouth daily.  11  . rosuvastatin (CRESTOR) 10 MG tablet Take 5 mg  by mouth every evening.     Marland Kitchen TOUJEO SOLOSTAR 300 UNIT/ML SOPN Inject 16 Units into the skin daily.   1  . trimethoprim (TRIMPEX) 100 MG tablet Take 100 mg by mouth daily.  9  . warfarin (COUMADIN) 5 MG tablet Take 2.5-5 mg by mouth See admin instructions. Take 2.5 mg by mouth daily on Monday and Friday. Take 5 mg by mouth on all other days     No current facility-administered medications for this visit.     PHYSICAL EXAMINATION: ECOG PERFORMANCE STATUS: 1 - Symptomatic but completely ambulatory  Vitals:   05/18/16 1357  BP: (!) 185/78  Pulse: 84    Resp: 17  Temp: 97.8 F (36.6 C)   Filed Weights   05/18/16 1357  Weight: 231 lb 14.4 oz (105.2 kg)    GENERAL:alert, no distress and comfortable SKIN: skin color, texture, turgor are normal, no rashes or significant lesions EYES: normal, Conjunctiva are pink and non-injected, sclera clear OROPHARYNX:no exudate, no erythema and lips, buccal mucosa, and tongue normal  NECK: supple, thyroid normal size, non-tender, without nodularity LYMPH:  no palpable lymphadenopathy in the cervical, axillary or inguinal LUNGS: clear to auscultation and percussion with normal breathing effort HEART: Atrial fibrillation ABDOMEN:abdomen soft, non-tender and normal bowel sounds MUSCULOSKELETAL:no cyanosis of digits and no clubbing  NEURO: alert & oriented x 3 with fluent speech, no focal motor/sensory deficits EXTREMITIES: No lower extremity edema  LABORATORY DATA:  I have reviewed the data as listed   Chemistry      Component Value Date/Time   NA 138 01/06/2016 1533   K 4.2 01/06/2016 1533   CL 106 04/14/2014 0006   CO2 24 01/06/2016 1533   BUN 16.8 01/06/2016 1533   CREATININE 0.9 01/06/2016 1533      Component Value Date/Time   CALCIUM 9.1 01/06/2016 1533   ALKPHOS 103 01/06/2016 1533   AST 21 01/06/2016 1533   ALT 26 01/06/2016 1533   BILITOT 0.50 01/06/2016 1533       Lab Results  Component Value Date   WBC 11.0 (H) 01/06/2016   HGB 13.9 01/06/2016   HCT 41.9 01/06/2016   MCV 93.1 01/06/2016   PLT 246 01/06/2016   NEUTROABS 6.2 01/06/2016    ASSESSMENT & PLAN:  Breast cancer of upper-outer quadrant of left female breast ([facility]) 12/10/2015: Left breast biopsy: Grade 1-2 IDC with calcifications with DCIS, ER 100%, PR 90%, Ki-67 5%, HER-2 negative ratio 1.45; left breast mass 1.9 x 1 x 1.1 cm; T1 CN 0 stage IA clinical stage 01/19/2016 Left lumpectomy: IDC with calcifications, 1.8 cms, grade 2, DCIS with calcifications, 0/1 lymph node negative, T1 CN 0 stage IA Oncotype DX  score 8: Risk of recurrence 6% Patient refused radiation therapy  Current treatment: No role of systemic chemotherapy adjuvant antiestrogen therapy with anastrozole 1 mg by mouth daily 5 years, started February 2018.  Anastrozole toxicities: 1. Intermittent hot flashes Denies any arthralgias or myalgias  Return to clinic in 6 months for follow-up   I spent 25 minutes talking to the patient of which more than half was spent in counseling and coordination of care.  No orders of the defined types were placed in this encounter.  The patient has a good understanding of the overall plan. she agrees with it. she will call with any problems that may develop before the next visit here.   Rulon Eisenmenger, MD 05/18/16

## 2016-05-19 ENCOUNTER — Encounter: Payer: Self-pay | Admitting: Cardiovascular Disease

## 2016-05-26 ENCOUNTER — Encounter (INDEPENDENT_AMBULATORY_CARE_PROVIDER_SITE_OTHER): Payer: Self-pay

## 2016-05-26 ENCOUNTER — Ambulatory Visit (INDEPENDENT_AMBULATORY_CARE_PROVIDER_SITE_OTHER): Payer: Medicare Other | Admitting: Cardiovascular Disease

## 2016-05-26 ENCOUNTER — Encounter: Payer: Self-pay | Admitting: Cardiovascular Disease

## 2016-05-26 VITALS — BP 124/80 | HR 99 | Ht 64.0 in | Wt 233.0 lb

## 2016-05-26 DIAGNOSIS — I10 Essential (primary) hypertension: Secondary | ICD-10-CM | POA: Diagnosis not present

## 2016-05-26 DIAGNOSIS — I482 Chronic atrial fibrillation: Secondary | ICD-10-CM | POA: Diagnosis not present

## 2016-05-26 DIAGNOSIS — I4821 Permanent atrial fibrillation: Secondary | ICD-10-CM

## 2016-05-26 MED ORDER — METOPROLOL SUCCINATE ER 50 MG PO TB24: ORAL_TABLET | ORAL | 11 refills | Status: DC

## 2016-05-26 NOTE — Progress Notes (Signed)
Chief Complaint  Patient presents with  . Follow-up   History of Present Illness: 82 yo WF with history of atrial fibrillation, DM, hypothyroidism, HTN, HLD, Vitamin B12 deficiency, iron deficiency here today for cardiac followup. I saw her in October 2012 as a new patient. She presented for her stress test as ordered by Dr. Osborne Casco and was found to be in atrial fibrillation. She had no awareness of palpitations. No stress induced ischemia on nuclear study. Low risk study. Normal LVEF. I started Toprol XL and Pradaxa. Echo on 01/12/11 with normal LV size and function, LVEF of 55-60%, mild LVH, moderate TR, mild MR. She was cardioverted on 02/27/11. She stopped her Pradaxa and started coumadin per Dr. Odette Fraction. She was seen here in August 2015 for planned f/u and was in atrial fib. HR was 100-110. I increased her Toprol to 50 mg per day.   She is here today for follow up. She is doing well. She has had no bleeding issue. No chest pain or SOB. No palpitations. She is exercising every day.   Primary Care Physician: Haywood Pao, MD  Past Medical History:  Diagnosis Date  . Atrial fibrillation ([facility])   . Breast cancer, left ([facility])   . Chronic kidney disease    stage III - patient was unaware  . Diabetes mellitus   . Dyspnea   . Dysrhythmia    Afib  . H/O: hysterectomy   . History of colonoscopy 01/25/2010  . History of mammogram 08/04/2009  . Hyperlipidemia   . Hypertension   . Hypothyroidism   . Ketoacidosis, diabetic, no coma, non-insulin dependent    Type II  . Vitamin B12 deficiency     Past Surgical History:  Procedure Laterality Date  . ABDOMINAL HYSTERECTOMY    . Bilateral foot surgery    . BREAST LUMPECTOMY WITH RADIOACTIVE SEED AND SENTINEL LYMPH NODE BIOPSY Left 01/19/2016   Procedure: LEFT BREAST LUMPECTOMY WITH RADIOACTIVE SEED AND SENTINEL LYMPH NODE BIOPSY;  Surgeon: Autumn Messing III, MD;  Location: [facility];  Service: General;  Laterality: Left;  . BREAST LUMPECTOMY  WITH RADIOACTIVE SEED LOCALIZATION Left 01/19/2016  . CARDIOVERSION  02/27/2011   Procedure: CARDIOVERSION;  Surgeon: Loralie Champagne, MD;  Location: [facility];  Service: Cardiovascular;  Laterality: N/A;  . LAPAROSCOPIC CHOLECYSTECTOMY    . ROTATOR CUFF REPAIR Left   . TUBAL LIGATION      Current Outpatient Prescriptions  Medication Sig Dispense Refill  . anastrozole (ARIMIDEX) 1 MG tablet Take 1 tablet (1 mg total) by mouth daily. 90 tablet 3  . cyanocobalamin 1000 MCG tablet Take 1,000 mcg by mouth daily.     . ferrous sulfate 325 (65 FE) MG tablet Take 325 mg by mouth daily with breakfast.      . glimepiride (AMARYL) 2 MG tablet Take 2 mg by mouth daily with breakfast.     . levothyroxine (SYNTHROID, LEVOTHROID) 125 MCG tablet Take 125 mcg by mouth daily.      Marland Kitchen losartan (COZAAR) 50 MG tablet Take 50 mg by mouth daily.    . metFORMIN (GLUCOPHAGE) 850 MG tablet Take 850 mg by mouth 2 (two) times daily with a meal.      . metoprolol succinate (TOPROL-XL) 50 MG 24 hr tablet TAKE 1 TABLET(50 MG) BY MOUTH DAILY 30 tablet 11  . Multiple Vitamins-Minerals (MULTIVITAMIN WITH MINERALS) tablet Take 1 tablet by mouth daily.      Marland Kitchen MYRBETRIQ 50 MG TB24 tablet Take 50 mg by mouth  daily.  11  . TOUJEO SOLOSTAR 300 UNIT/ML SOPN Inject 16 Units into the skin daily.   1  . trimethoprim (TRIMPEX) 100 MG tablet Take 100 mg by mouth daily.  9  . warfarin (COUMADIN) 5 MG tablet Take 2.5-5 mg by mouth See admin instructions. Take 2.5 mg by mouth daily on Monday and Friday. Take 5 mg by mouth on all other days     No current facility-administered medications for this visit.     Allergies  Allergen Reactions  . Bee Venom Nausea And Vomiting    Pt feels faint  . Penicillins Anaphylaxis, Hives and Other (See Comments)    THROAT CLOSES LIPS SWELL TURNS PURPLE FEEL FAINT    Social History   Social History  . Marital status: Married    Spouse name: N/A  . Number of children: 2  . Years of education: N/A    Occupational History  .  Other    Worked at [employer] Topics  . Smoking status: Never Smoker  . Smokeless tobacco: Never Used  . Alcohol use No  . Drug use: No  . Sexual activity: Not on file   Other Topics Concern  . Not on file   Social History Narrative   Patient since 44   Husband with prostate cancer   10-siblings-no cancer    Family History  Problem Relation Age of Onset  . Heart failure Father 17    enlarged heart  . Pneumonia Mother 19  . Diabetes Mother 73  . Breast cancer Sister   . Cancer Neg Hx     Review of Systems:  As stated in the HPI and otherwise negative.   BP 124/80   Pulse 99   Ht 5\' 4"  (1.626 m)   Wt 233 lb (105.7 kg)   LMP  (LMP Unknown)   SpO2 98%   BMI 39.99 kg/m   Physical Examination: General: Well developed, well nourished, NAD  HEENT: OP clear, mucus membranes moist  SKIN: warm, dry. No rashes. Neuro: No focal deficits  Musculoskeletal: Muscle strength 5/5 all ext  Psychiatric: Mood and affect normal  Neck: No JVD, no carotid bruits, no thyromegaly, no lymphadenopathy.  Lungs:Clear bilaterally, no wheezes, rhonci, crackles Cardiovascular: Regular rate and rhythm. No murmurs, gallops or rubs. Abdomen:Soft. Bowel sounds present. Non-tender.  Extremities: No lower extremity edema. Pulses are 2 + in the bilateral DP/PT.  EKG:  EKG is  ordered today. The ekg ordered today demonstrates Atrial fib, rate 99 bpm. Poor R wave progression precordial leads. Unchanged  Recent Labs: 01/06/2016: ALT 26; BUN 16.8; Creatinine 0.9; HGB 13.9; Platelets 246; Potassium 4.2; Sodium 138   Lipid Panel No results found for: CHOL, TRIG, HDL, CHOLHDL, VLDL, LDLCALC, LDLDIRECT   Wt Readings from Last 3 Encounters:  05/26/16 233 lb (105.7 kg)  05/18/16 231 lb 14.4 oz (105.2 kg)  02/16/16 238 lb 9.6 oz (108.2 kg)     Other studies Reviewed: Additional studies/ records that were reviewed today include: . Review of the above  records demonstrates:    Assessment and Plan:   1. Atrial fibrillation, permanent: Rate controlled. Continue Toprol XL 50 mg po Qdaily. Continue coumadin. No bleeding issues. I have asked her to consider changing to Eliquis or Xarelto as she has had INR levels high and low recently.   2. HTN: BP controlled. No changes  Current medicines are reviewed at length with the patient today.  The patient does not have concerns  regarding medicines.  The following changes have been made:  no change  Labs/ tests ordered today include:   Orders Placed This Encounter  Procedures  . EKG 12-Lead    Disposition:   FU with me in 12  months  Signed, Lauree Chandler, MD 05/26/2016 1:33 PM    [facility] [address], [place], [place]  [address] Phone: [phone]; Fax: [phone]

## 2016-05-26 NOTE — Patient Instructions (Signed)

## 2016-06-01 DIAGNOSIS — Z7901 Long term (current) use of anticoagulants: Secondary | ICD-10-CM | POA: Diagnosis not present

## 2016-06-19 ENCOUNTER — Ambulatory Visit: Payer: Medicare Other | Admitting: Hematology and Oncology

## 2016-06-28 DIAGNOSIS — R809 Proteinuria, unspecified: Secondary | ICD-10-CM | POA: Diagnosis not present

## 2016-06-28 DIAGNOSIS — E11319 Type 2 diabetes mellitus with unspecified diabetic retinopathy without macular edema: Secondary | ICD-10-CM | POA: Diagnosis not present

## 2016-06-28 DIAGNOSIS — I129 Hypertensive chronic kidney disease with stage 1 through stage 4 chronic kidney disease, or unspecified chronic kidney disease: Secondary | ICD-10-CM | POA: Diagnosis not present

## 2016-06-28 DIAGNOSIS — E1129 Type 2 diabetes mellitus with other diabetic kidney complication: Secondary | ICD-10-CM | POA: Diagnosis not present

## 2016-06-28 DIAGNOSIS — Z7901 Long term (current) use of anticoagulants: Secondary | ICD-10-CM | POA: Diagnosis not present

## 2016-06-28 DIAGNOSIS — E538 Deficiency of other specified B group vitamins: Secondary | ICD-10-CM | POA: Diagnosis not present

## 2016-06-28 DIAGNOSIS — N183 Chronic kidney disease, stage 3 (moderate): Secondary | ICD-10-CM | POA: Diagnosis not present

## 2016-06-28 DIAGNOSIS — Z6838 Body mass index (BMI) 38.0-38.9, adult: Secondary | ICD-10-CM | POA: Diagnosis not present

## 2016-06-28 DIAGNOSIS — N3281 Overactive bladder: Secondary | ICD-10-CM | POA: Diagnosis not present

## 2016-06-28 DIAGNOSIS — C50919 Malignant neoplasm of unspecified site of unspecified female breast: Secondary | ICD-10-CM | POA: Diagnosis not present

## 2016-06-28 DIAGNOSIS — I480 Paroxysmal atrial fibrillation: Secondary | ICD-10-CM | POA: Diagnosis not present

## 2016-08-23 NOTE — Progress Notes (Deleted)
CLINIC:  Survivorship   REASON FOR VISIT:  Routine follow-up post-treatment for a recent history of breast cancer.  BRIEF ONCOLOGIC HISTORY:    Breast cancer of upper-outer quadrant of left female breast ([place])   12/15/2015 Initial Diagnosis    Left breast biopsy: Grade 1-2 IDC with calcifications with DCIS, ER 100%, PR 90%, Ki-67 5%, HER-2 negative ratio 1.45; left breast mass 1.9 x 1 x 1.1 cm; T1 CN 0 stage IA clinical stage      01/19/2016 Surgery    Left lumpectomy: IDC with calcifications, 1.8 cm, grade 2, DCIS with calcifications, 0/1 lymph node negative, T1 CN 0 stage IA      01/26/2016 Oncotype testing    Oncotype DX score 8: risk of recurrence 6%       Radiation Therapy    Refused radiation therapy      04/13/2016 -  Anti-estrogen oral therapy    Anastrozole 1 mg daily       INTERVAL HISTORY:  Summer Hawkins presents to the [facility] today for our initial meeting to review her survivorship care plan detailing her treatment course for breast cancer, as well as monitoring long-term side effects of that treatment, education regarding health maintenance, screening, and overall wellness and health promotion.     Overall, Summer Hawkins reports feeling quite well since completing her radiation therapy approximately 3 months ago.  She ***    REVIEW OF SYSTEMS:  ***  Breast: Denies any new nodularity, masses, tenderness, nipple changes, or nipple discharge.    A 14-point review of systems was completed and was negative, except as noted above.   ONCOLOGY TREATMENT TEAM:  1. Surgeon:  Dr. Harolyn Rutherford*** at [facility] 2. Medical Oncologist: Dr. Magrinat/Gudena/Feng***  3. Radiation Oncologist: Dr. Verl Bangs***    PAST MEDICAL/SURGICAL HISTORY:  Past Medical History:  Diagnosis Date  . Atrial fibrillation ([place])   . Breast cancer, left ([place])   . Chronic kidney disease    stage III - patient was  unaware  . Diabetes mellitus   . Dyspnea   . Dysrhythmia    Afib  . H/O: hysterectomy   . History of colonoscopy 01/25/2010  . History of mammogram 08/04/2009  . Hyperlipidemia   . Hypertension   . Hypothyroidism   . Ketoacidosis, diabetic, no coma, non-insulin dependent    Type II  . Vitamin B12 deficiency    Past Surgical History:  Procedure Laterality Date  . ABDOMINAL HYSTERECTOMY    . Bilateral foot surgery    . BREAST LUMPECTOMY WITH RADIOACTIVE SEED AND SENTINEL LYMPH NODE BIOPSY Left 01/19/2016   Procedure: LEFT BREAST LUMPECTOMY WITH RADIOACTIVE SEED AND SENTINEL LYMPH NODE BIOPSY;  Surgeon: Autumn Messing III, MD;  Location: [place];  Service: General;  Laterality: Left;  . BREAST LUMPECTOMY WITH RADIOACTIVE SEED LOCALIZATION Left 01/19/2016  . CARDIOVERSION  02/27/2011   Procedure: CARDIOVERSION;  Surgeon: Loralie Champagne, MD;  Location: [place];  Service: Cardiovascular;  Laterality: N/A;  . LAPAROSCOPIC CHOLECYSTECTOMY    . ROTATOR CUFF REPAIR Left   . TUBAL LIGATION       ALLERGIES:  Allergies  Allergen Reactions  . Bee Venom Nausea And Vomiting    Pt feels faint  . Penicillins Anaphylaxis, Hives and Other (See Comments)    THROAT CLOSES LIPS SWELL TURNS PURPLE FEEL FAINT     CURRENT MEDICATIONS:  Outpatient Encounter Prescriptions as of 08/24/2016  Medication Sig  . anastrozole (ARIMIDEX) 1 MG tablet Take 1 tablet (1 mg  total) by mouth daily.  . cyanocobalamin 1000 MCG tablet Take 1,000 mcg by mouth daily.   . ferrous sulfate 325 (65 FE) MG tablet Take 325 mg by mouth daily with breakfast.    . glimepiride (AMARYL) 2 MG tablet Take 2 mg by mouth daily with breakfast.   . levothyroxine (SYNTHROID, LEVOTHROID) 125 MCG tablet Take 125 mcg by mouth daily.    Marland Kitchen losartan (COZAAR) 50 MG tablet Take 50 mg by mouth daily.  . metFORMIN (GLUCOPHAGE) 850 MG tablet Take 850 mg by mouth 2 (two) times daily with a meal.    . metoprolol succinate (TOPROL-XL) 50 MG 24 hr tablet  TAKE 1 TABLET(50 MG) BY MOUTH DAILY  . Multiple Vitamins-Minerals (MULTIVITAMIN WITH MINERALS) tablet Take 1 tablet by mouth daily.    Marland Kitchen MYRBETRIQ 50 MG TB24 tablet Take 50 mg by mouth daily.  Nelva Nay SOLOSTAR 300 UNIT/ML SOPN Inject 16 Units into the skin daily.   Marland Kitchen trimethoprim (TRIMPEX) 100 MG tablet Take 100 mg by mouth daily.  Marland Kitchen warfarin (COUMADIN) 5 MG tablet Take 2.5-5 mg by mouth See admin instructions. Take 2.5 mg by mouth daily on Monday and Friday. Take 5 mg by mouth on all other days   No facility-administered encounter medications on file as of 08/24/2016.      ONCOLOGIC FAMILY HISTORY:  Family History  Problem Relation Age of Onset  . Heart failure Father 41       enlarged heart  . Pneumonia Mother 65  . Diabetes Mother 52  . Breast cancer Sister   . Cancer Neg Hx      GENETIC COUNSELING/TESTING: ***  SOCIAL HISTORY:  Summer Hawkins is /single/married/divorced/widowed/separated and lives alone/with her spouse/family/friend in (city), [place].  She has (#) children and they live in (city).  Summer Hawkins is currently retired/disabled/working part-time/full-time as ***.  She denies any current or history of tobacco, alcohol, or illicit drug use.     PHYSICAL EXAMINATION:  Vital Signs:  There were no vitals filed for this visit. There were no vitals filed for this visit. General: Well-nourished, well-appearing female in no acute distress.  She is unaccompanied/accompanied in clinic by her ***** today.   HEENT: Head is normocephalic.  Pupils equal and reactive to light. Conjunctivae clear without exudate.  Sclerae anicteric. Oral mucosa is pink, moist.  Oropharynx is pink without lesions or erythema.  Lymph: No cervical, supraclavicular, or infraclavicular lymphadenopathy noted on palpation.  Cardiovascular: Regular rate and rhythm.Marland Kitchen Respiratory: Clear to auscultation bilaterally. Chest expansion symmetric; breathing non-labored.  GI: Abdomen soft and round;  non-tender, non-distended. Bowel sounds normoactive.  GU: Deferred.  Neuro: No focal deficits. Steady gait.  Psych: Mood and affect normal and appropriate for situation.  Extremities: No edema. Skin: Warm and dry.  LABORATORY DATA:  None for this visit.  DIAGNOSTIC IMAGING:  None for this visit.      ASSESSMENT AND PLAN:  Ms.. Hawkins is a pleasant 82 y.o. female with Stage *** right/left breast invasive ductal carcinoma, ER+/PR+/HER2-, diagnosed in (date), treated with lumpectomy, adjuvant radiation therapy, and anti-estrogen therapy with *** beginning in (date).  She presents to the Survivorship Clinic for our initial meeting and routine follow-up post-completion of treatment for breast cancer.    1. Stage *** right/left breast cancer:  Summer Hawkins is continuing to recover from definitive treatment for breast cancer. She will follow-up with her medical oncologist, Dr. Ross Ludwig in (month) /2017 with history and physical exam per surveillance protocol.  She  will continue her anti-estrogen therapy with (drug). Thus far, she is tolerating the *** well, with minimal side effects. She was instructed to make Dr. Lindi Adie or myself aware if she begins to experience any worsening side effects of the medication and I could see her back in clinic to help manage those side effects, as needed. Though the incidence is low, there is an associated risk of endometrial cancer with anti-estrogen therapies like Tamoxifen.  Summer Hawkins was encouraged to contact Dr. Carrington Hawkins or myself with any vaginal bleeding while taking Tamoxifen. Other side effects of Tamoxifen were again reviewed with her as well. Today, a comprehensive survivorship care plan and treatment summary was reviewed with the patient today detailing her breast cancer diagnosis, treatment course, potential late/long-term effects of treatment, appropriate follow-up care with recommendations for the future, and patient education  resources.  A copy of this summary, along with a letter will be sent to the patient's primary care provider via mail/fax/In Basket message after today's visit.    #. Problem(s) at Visit______________  #. Bone health:  Given Summer Hawkins's age/history of breast cancer and her current treatment regimen including anti-estrogen therapy with _______, she is at risk for bone demineralization.  Her last DEXA scan was **/**/20**, which showed (results).***  In the meantime, she was encouraged to increase her consumption of foods rich in calcium, as well as increase her weight-bearing activities.  She was given education on specific activities to promote bone health.  #. Cancer screening:  Due to Summer Hawkins's history and her age, she should receive screening for skin cancers, colon cancer, and gynecologic cancers.  The information and recommendations are listed on the patient's comprehensive care plan/treatment summary and were reviewed in detail with the patient.    #. Health maintenance and wellness promotion: Summer Hawkins was encouraged to consume 5-7 servings of fruits and vegetables per day. We reviewed the "Nutrition Rainbow" handout, as well as the handout "Take Control of Your Health and Reduce Your Cancer Risk" from the [facility].  She was also encouraged to engage in moderate to vigorous exercise for 30 minutes per day most days of the week. We discussed the [employer] fitness program, which is designed for cancer survivors to help them become more physically fit after cancer treatments.  She was instructed to limit her alcohol consumption and continue to abstain from tobacco use/***was encouraged stop smoking.     #. Support services/counseling: It is not uncommon for this period of the patient's cancer care trajectory to be one of many emotions and stressors.  We discussed an opportunity for her to participate in the next session of [facility] ("Finding Your New Normal") support group series  designed for patients after they have completed treatment.   Summer Hawkins was encouraged to take advantage of our many other support services programs, support groups, and/or counseling in coping with her new life as a cancer survivor after completing anti-cancer treatment.  She was offered support today through active listening and expressive supportive counseling.  She was given information regarding our available services and encouraged to contact me with any questions or for help enrolling in any of our support group/programs.    Dispo:   -Return to cancer center ***  -She is welcome to return back to the Survivorship Clinic at any time; no additional follow-up needed at this time.  -Consider referral back to survivorship as a long-term survivor for continued surveillance  A total of (#) minutes of face-to-face time was  spent with this patient with greater than 50% of that time in counseling and care-coordination.   Charlestine Massed, NP Survivorship Program [place] [phone]   Note: PRIMARY CARE PROVIDER Haywood Pao, [place] [phone]

## 2016-08-24 ENCOUNTER — Encounter: Payer: Medicare Other | Admitting: Adult Health

## 2016-09-18 DIAGNOSIS — N3941 Urge incontinence: Secondary | ICD-10-CM | POA: Diagnosis not present

## 2016-09-18 DIAGNOSIS — N3020 Other chronic cystitis without hematuria: Secondary | ICD-10-CM | POA: Diagnosis not present

## 2016-10-24 DIAGNOSIS — H353131 Nonexudative age-related macular degeneration, bilateral, early dry stage: Secondary | ICD-10-CM | POA: Diagnosis not present

## 2016-10-24 DIAGNOSIS — E113293 Type 2 diabetes mellitus with mild nonproliferative diabetic retinopathy without macular edema, bilateral: Secondary | ICD-10-CM | POA: Diagnosis not present

## 2016-10-24 DIAGNOSIS — H43813 Vitreous degeneration, bilateral: Secondary | ICD-10-CM | POA: Diagnosis not present

## 2016-10-25 DIAGNOSIS — H52203 Unspecified astigmatism, bilateral: Secondary | ICD-10-CM | POA: Diagnosis not present

## 2016-10-25 DIAGNOSIS — H43813 Vitreous degeneration, bilateral: Secondary | ICD-10-CM | POA: Diagnosis not present

## 2016-10-25 DIAGNOSIS — Z961 Presence of intraocular lens: Secondary | ICD-10-CM | POA: Diagnosis not present

## 2016-10-25 DIAGNOSIS — H04123 Dry eye syndrome of bilateral lacrimal glands: Secondary | ICD-10-CM | POA: Diagnosis not present

## 2016-11-16 ENCOUNTER — Ambulatory Visit (HOSPITAL_BASED_OUTPATIENT_CLINIC_OR_DEPARTMENT_OTHER): Payer: Medicare Other | Admitting: Hematology and Oncology

## 2016-11-16 DIAGNOSIS — Z79811 Long term (current) use of aromatase inhibitors: Secondary | ICD-10-CM

## 2016-11-16 DIAGNOSIS — N951 Menopausal and female climacteric states: Secondary | ICD-10-CM

## 2016-11-16 DIAGNOSIS — C50412 Malignant neoplasm of upper-outer quadrant of left female breast: Secondary | ICD-10-CM | POA: Diagnosis not present

## 2016-11-16 DIAGNOSIS — Z170 Estrogen receptor positive status [ER+]: Secondary | ICD-10-CM | POA: Diagnosis not present

## 2016-11-16 MED ORDER — ANASTROZOLE 1 MG PO TABS: 1.0000 mg | ORAL_TABLET | Freq: Every day | ORAL | 3 refills | Status: DC

## 2016-11-16 MED ORDER — APIXABAN 5 MG PO TABS
5.0000 mg | ORAL_TABLET | Freq: Two times a day (BID) | ORAL | Status: DC
Start: 2016-11-16 — End: 2020-12-01

## 2016-11-16 NOTE — Assessment & Plan Note (Signed)
12/10/2015: Left breast biopsy: Grade 1-2 IDC with calcifications with DCIS, ER 100%, PR 90%, Ki-67 5%, HER-2 negative ratio 1.45; left breast mass 1.9 x 1 x 1.1 cm; T1 CN 0 stage IA clinical stage 01/19/2016 Left lumpectomy: IDC with calcifications, 1.8 cms, grade 2, DCIS with calcifications, 0/1 lymph node negative, T1 CN 0 stage IA Oncotype DX score 8: Risk of recurrence 6% Patient refused radiation therapy  Current treatment:No role of systemic chemotherapy adjuvant antiestrogen therapy with anastrozole 1 mg by mouth daily 5 years, started February 2018.  Anastrozole toxicities: 1. Intermittent hot flashes Denies any arthralgias or myalgias  Surveillance: 1. Breast exam 11/16/2016: No palpable hematochezia concern 2. Mammogram will be scheduled for September 2018  Return to clinic in one year for follow-up

## 2016-11-16 NOTE — Progress Notes (Signed)
Patient Care Team: Tisovec, Fransico Him, MD as PCP - General (Internal Medicine) Jovita Kussmaul, MD as Consulting Physician (General Surgery) Nicholas Lose, MD as Consulting Physician (Hematology and Oncology) Delice Bison Charlestine Massed, NP as Nurse Practitioner (Hematology and Oncology)  DIAGNOSIS:  Encounter Diagnosis  Name Primary?  . Malignant neoplasm of upper-outer quadrant of left breast in female, estrogen receptor positive ([name])     SUMMARY OF ONCOLOGIC HISTORY:   Breast cancer of upper-outer quadrant of left female breast ([name])   12/15/2015 Initial Diagnosis    Left breast biopsy: Grade 1-2 IDC with calcifications with DCIS, ER 100%, PR 90%, Ki-67 5%, HER-2 negative ratio 1.45; left breast mass 1.9 x 1 x 1.1 cm; T1 CN 0 stage IA clinical stage      01/19/2016 Surgery    Left lumpectomy: IDC with calcifications, 1.8 cm, grade 2, DCIS with calcifications, 0/1 lymph node negative, T1 CN 0 stage IA      01/26/2016 Oncotype testing    Oncotype DX score 8: risk of recurrence 6%       Radiation Therapy    Refused radiation therapy      04/13/2016 -  Anti-estrogen oral therapy    Anastrozole 1 mg daily       CHIEF COMPLIANT: Follow-up on anastrozole therapy  INTERVAL HISTORY: Summer Hawkins is a 82 year old with above-mentioned history left breast cancer treated with lumpectomy and is currently on anastrozole therapy. She refuses radiation. She appears to be tolerating anastrozole extremely well. She does have intermittent muscle aches and pains. Very occasional hot flashes. Denies any lumps or nodules in the breast.  REVIEW OF SYSTEMS:   Constitutional: Denies fevers, chills or abnormal weight loss Eyes: Denies blurriness of vision Ears, nose, mouth, throat, and face: Denies mucositis or sore throat Respiratory: Denies cough, dyspnea or wheezes Cardiovascular: Denies palpitation, chest discomfort Gastrointestinal:  Denies nausea, heartburn or change in bowel  habits Skin: Denies abnormal skin rashes Lymphatics: Denies new lymphadenopathy or easy bruising Neurological:Denies numbness, tingling or new weaknesses Behavioral/Psych: Mood is stable, no new changes  Extremities: No lower extremity edema Breast:  denies any pain or lumps or nodules in either breasts All other systems were reviewed with the patient and are negative.  I have reviewed the past medical history, past surgical history, social history and family history with the patient and they are unchanged from previous note.  ALLERGIES:  is allergic to bee venom and penicillins.  MEDICATIONS:  Current Outpatient Prescriptions  Medication Sig Dispense Refill  . anastrozole (ARIMIDEX) 1 MG tablet Take 1 tablet (1 mg total) by mouth daily. 90 tablet 3  . cyanocobalamin 1000 MCG tablet Take 1,000 mcg by mouth daily.     . ferrous sulfate 325 (65 FE) MG tablet Take 325 mg by mouth daily with breakfast.      . glimepiride (AMARYL) 2 MG tablet Take 2 mg by mouth daily with breakfast.     . levothyroxine (SYNTHROID, LEVOTHROID) 125 MCG tablet Take 125 mcg by mouth daily.      Marland Kitchen losartan (COZAAR) 50 MG tablet Take 50 mg by mouth daily.    . metFORMIN (GLUCOPHAGE) 850 MG tablet Take 850 mg by mouth 2 (two) times daily with a meal.      . metoprolol succinate (TOPROL-XL) 50 MG 24 hr tablet TAKE 1 TABLET(50 MG) BY MOUTH DAILY 30 tablet 11  . Multiple Vitamins-Minerals (MULTIVITAMIN WITH MINERALS) tablet Take 1 tablet by mouth daily.      Marland Kitchen  MYRBETRIQ 50 MG TB24 tablet Take 50 mg by mouth daily.  11  . TOUJEO SOLOSTAR 300 UNIT/ML SOPN Inject 16 Units into the skin daily.   1  . trimethoprim (TRIMPEX) 100 MG tablet Take 100 mg by mouth daily.  9  . warfarin (COUMADIN) 5 MG tablet Take 2.5-5 mg by mouth See admin instructions. Take 2.5 mg by mouth daily on Monday and Friday. Take 5 mg by mouth on all other days     No current facility-administered medications for this visit.     PHYSICAL  EXAMINATION: ECOG PERFORMANCE STATUS: 1 - Symptomatic but completely ambulatory  Vitals:   11/16/16 1404  BP: (!) 168/100  Pulse: 90  Resp: 18  Temp: 98.7 F (37.1 C)  SpO2: 97%   Filed Weights   11/16/16 1404  Weight: 232 lb (105.2 kg)    GENERAL:alert, no distress and comfortable SKIN: skin color, texture, turgor are normal, no rashes or significant lesions EYES: normal, Conjunctiva are pink and non-injected, sclera clear OROPHARYNX:no exudate, no erythema and lips, buccal mucosa, and tongue normal  NECK: supple, thyroid normal size, non-tender, without nodularity LYMPH:  no palpable lymphadenopathy in the cervical, axillary or inguinal LUNGS: clear to auscultation and percussion with normal breathing effort HEART: regular rate & rhythm and no murmurs and no lower extremity edema ABDOMEN:abdomen soft, non-tender and normal bowel sounds MUSCULOSKELETAL:no cyanosis of digits and no clubbing  NEURO: alert & oriented x 3 with fluent speech, no focal motor/sensory deficits EXTREMITIES: No lower extremity edema BREAST: No palpable masses or nodules in either right or left breasts. No palpable axillary supraclavicular or infraclavicular adenopathy no breast tenderness or nipple discharge. (exam performed in the presence of a chaperone)  LABORATORY DATA:  I have reviewed the data as listed   Chemistry      Component Value Date/Time   NA 138 01/06/2016 1533   K 4.2 01/06/2016 1533   CL 106 04/14/2014 0006   CO2 24 01/06/2016 1533   BUN 16.8 01/06/2016 1533   CREATININE 0.9 01/06/2016 1533      Component Value Date/Time   CALCIUM 9.1 01/06/2016 1533   ALKPHOS 103 01/06/2016 1533   AST 21 01/06/2016 1533   ALT 26 01/06/2016 1533   BILITOT 0.50 01/06/2016 1533       Lab Results  Component Value Date   WBC 11.0 (H) 01/06/2016   HGB 13.9 01/06/2016   HCT 41.9 01/06/2016   MCV 93.1 01/06/2016   PLT 246 01/06/2016   NEUTROABS 6.2 01/06/2016    ASSESSMENT & PLAN:   Breast cancer of upper-outer quadrant of left female breast ([name]) 12/10/2015: Left breast biopsy: Grade 1-2 IDC with calcifications with DCIS, ER 100%, PR 90%, Ki-67 5%, HER-2 negative ratio 1.45; left breast mass 1.9 x 1 x 1.1 cm; T1 CN 0 stage IA clinical stage 01/19/2016 Left lumpectomy: IDC with calcifications, 1.8 cms, grade 2, DCIS with calcifications, 0/1 lymph node negative, T1 CN 0 stage IA Oncotype DX score 8: Risk of recurrence 6% Patient refused radiation therapy  Current treatment:No role of systemic chemotherapy adjuvant antiestrogen therapy with anastrozole 1 mg by mouth daily 5 years, started February 2018.  Anastrozole toxicities: 1. Intermittent hot flashes Denies any arthralgias or myalgias  Surveillance: 1. Breast exam 11/16/2016: No palpable hematochezia concern 2. Mammogram will be scheduled for September 2018  Return to clinic in one year for follow-up   I spent 25 minutes talking to the patient of which more than half was spent  in counseling and coordination of care.  No orders of the defined types were placed in this encounter.  The patient has a good understanding of the overall plan. she agrees with it. she will call with any problems that may develop before the next visit here.   Rulon Eisenmenger, MD 11/16/16

## 2016-12-08 ENCOUNTER — Emergency Department (HOSPITAL_COMMUNITY)
Admission: EM | Admit: 2016-12-08 | Discharge: 2016-12-08 | Disposition: A | Discharge status: Home / Self Care | Payer: Medicare Other | Attending: Emergency Medicine | Admitting: Emergency Medicine

## 2016-12-08 ENCOUNTER — Encounter (HOSPITAL_COMMUNITY): Payer: Self-pay

## 2016-12-08 DIAGNOSIS — M62830 Muscle spasm of back: Secondary | ICD-10-CM | POA: Diagnosis not present

## 2016-12-08 DIAGNOSIS — Z5321 Procedure and treatment not carried out due to patient leaving prior to being seen by health care provider: Secondary | ICD-10-CM | POA: Diagnosis not present

## 2016-12-08 DIAGNOSIS — S39012A Strain of muscle, fascia and tendon of lower back, initial encounter: Secondary | ICD-10-CM | POA: Diagnosis not present

## 2016-12-08 NOTE — ED Notes (Signed)
Pt told registration and EMT she was leaving.

## 2016-12-08 NOTE — ED Triage Notes (Addendum)
Patient c/o intermittent left lower back spasms. Patient states she has been taking hydrocodone and the last being 0200 today with no relief. Patient states she last took an Aleve at 1200 last night

## 2016-12-08 NOTE — ED Notes (Signed)
Pt refused vital recheck. Pt stated they have already been done twice and she doesn't want EMT to do them again.

## 2016-12-12 ENCOUNTER — Ambulatory Visit
Admission: RE | Admit: 2016-12-12 | Discharge: 2016-12-12 | Disposition: A | Discharge status: Home / Self Care | Payer: Medicare Other | Source: Ambulatory Visit | Attending: Hematology and Oncology | Admitting: Hematology and Oncology

## 2016-12-12 DIAGNOSIS — Z170 Estrogen receptor positive status [ER+]: Secondary | ICD-10-CM

## 2016-12-12 DIAGNOSIS — R928 Other abnormal and inconclusive findings on diagnostic imaging of breast: Secondary | ICD-10-CM | POA: Diagnosis not present

## 2016-12-12 DIAGNOSIS — C50412 Malignant neoplasm of upper-outer quadrant of left female breast: Secondary | ICD-10-CM

## 2016-12-12 HISTORY — DX: Malignant neoplasm of unspecified site of unspecified female breast: C50.919

## 2016-12-26 DIAGNOSIS — I129 Hypertensive chronic kidney disease with stage 1 through stage 4 chronic kidney disease, or unspecified chronic kidney disease: Secondary | ICD-10-CM | POA: Diagnosis not present

## 2016-12-26 DIAGNOSIS — I480 Paroxysmal atrial fibrillation: Secondary | ICD-10-CM | POA: Diagnosis not present

## 2016-12-26 DIAGNOSIS — E7800 Pure hypercholesterolemia, unspecified: Secondary | ICD-10-CM | POA: Diagnosis not present

## 2016-12-26 DIAGNOSIS — Z6839 Body mass index (BMI) 39.0-39.9, adult: Secondary | ICD-10-CM | POA: Diagnosis not present

## 2016-12-26 DIAGNOSIS — R808 Other proteinuria: Secondary | ICD-10-CM | POA: Diagnosis not present

## 2016-12-26 DIAGNOSIS — Z7901 Long term (current) use of anticoagulants: Secondary | ICD-10-CM | POA: Diagnosis not present

## 2016-12-26 DIAGNOSIS — Z23 Encounter for immunization: Secondary | ICD-10-CM | POA: Diagnosis not present

## 2016-12-26 DIAGNOSIS — E11319 Type 2 diabetes mellitus with unspecified diabetic retinopathy without macular edema: Secondary | ICD-10-CM | POA: Diagnosis not present

## 2016-12-26 DIAGNOSIS — C50919 Malignant neoplasm of unspecified site of unspecified female breast: Secondary | ICD-10-CM | POA: Diagnosis not present

## 2016-12-26 DIAGNOSIS — E038 Other specified hypothyroidism: Secondary | ICD-10-CM | POA: Diagnosis not present

## 2016-12-26 DIAGNOSIS — N183 Chronic kidney disease, stage 3 (moderate): Secondary | ICD-10-CM | POA: Diagnosis not present

## 2016-12-26 DIAGNOSIS — E1129 Type 2 diabetes mellitus with other diabetic kidney complication: Secondary | ICD-10-CM | POA: Diagnosis not present

## 2017-03-15 DIAGNOSIS — N183 Chronic kidney disease, stage 3 (moderate): Secondary | ICD-10-CM | POA: Diagnosis not present

## 2017-03-15 DIAGNOSIS — E538 Deficiency of other specified B group vitamins: Secondary | ICD-10-CM | POA: Diagnosis not present

## 2017-03-15 DIAGNOSIS — E1129 Type 2 diabetes mellitus with other diabetic kidney complication: Secondary | ICD-10-CM | POA: Diagnosis not present

## 2017-03-15 DIAGNOSIS — E7800 Pure hypercholesterolemia, unspecified: Secondary | ICD-10-CM | POA: Diagnosis not present

## 2017-03-15 DIAGNOSIS — R82998 Other abnormal findings in urine: Secondary | ICD-10-CM | POA: Diagnosis not present

## 2017-03-15 DIAGNOSIS — L648 Other androgenic alopecia: Secondary | ICD-10-CM | POA: Diagnosis not present

## 2017-03-15 DIAGNOSIS — M792 Neuralgia and neuritis, unspecified: Secondary | ICD-10-CM | POA: Diagnosis not present

## 2017-03-15 DIAGNOSIS — E038 Other specified hypothyroidism: Secondary | ICD-10-CM | POA: Diagnosis not present

## 2017-03-22 DIAGNOSIS — Z1389 Encounter for screening for other disorder: Secondary | ICD-10-CM | POA: Diagnosis not present

## 2017-03-22 DIAGNOSIS — E1129 Type 2 diabetes mellitus with other diabetic kidney complication: Secondary | ICD-10-CM | POA: Diagnosis not present

## 2017-03-22 DIAGNOSIS — C50919 Malignant neoplasm of unspecified site of unspecified female breast: Secondary | ICD-10-CM | POA: Diagnosis not present

## 2017-03-22 DIAGNOSIS — Z7901 Long term (current) use of anticoagulants: Secondary | ICD-10-CM | POA: Diagnosis not present

## 2017-03-22 DIAGNOSIS — I480 Paroxysmal atrial fibrillation: Secondary | ICD-10-CM | POA: Diagnosis not present

## 2017-03-22 DIAGNOSIS — E11319 Type 2 diabetes mellitus with unspecified diabetic retinopathy without macular edema: Secondary | ICD-10-CM | POA: Diagnosis not present

## 2017-03-22 DIAGNOSIS — Z6839 Body mass index (BMI) 39.0-39.9, adult: Secondary | ICD-10-CM | POA: Diagnosis not present

## 2017-03-22 DIAGNOSIS — I129 Hypertensive chronic kidney disease with stage 1 through stage 4 chronic kidney disease, or unspecified chronic kidney disease: Secondary | ICD-10-CM | POA: Diagnosis not present

## 2017-03-22 DIAGNOSIS — E7800 Pure hypercholesterolemia, unspecified: Secondary | ICD-10-CM | POA: Diagnosis not present

## 2017-03-22 DIAGNOSIS — Z0000 Encounter for general adult medical examination without abnormal findings: Secondary | ICD-10-CM | POA: Diagnosis not present

## 2017-03-22 DIAGNOSIS — R808 Other proteinuria: Secondary | ICD-10-CM | POA: Diagnosis not present

## 2017-03-22 DIAGNOSIS — N183 Chronic kidney disease, stage 3 (moderate): Secondary | ICD-10-CM | POA: Diagnosis not present

## 2017-05-15 DIAGNOSIS — E113293 Type 2 diabetes mellitus with mild nonproliferative diabetic retinopathy without macular edema, bilateral: Secondary | ICD-10-CM | POA: Diagnosis not present

## 2017-05-15 DIAGNOSIS — H43813 Vitreous degeneration, bilateral: Secondary | ICD-10-CM | POA: Diagnosis not present

## 2017-05-15 DIAGNOSIS — H353131 Nonexudative age-related macular degeneration, bilateral, early dry stage: Secondary | ICD-10-CM | POA: Diagnosis not present

## 2017-06-19 DIAGNOSIS — I10 Essential (primary) hypertension: Secondary | ICD-10-CM | POA: Diagnosis not present

## 2017-06-19 DIAGNOSIS — R0609 Other forms of dyspnea: Secondary | ICD-10-CM | POA: Diagnosis not present

## 2017-06-19 DIAGNOSIS — I480 Paroxysmal atrial fibrillation: Secondary | ICD-10-CM | POA: Diagnosis not present

## 2017-06-19 DIAGNOSIS — I498 Other specified cardiac arrhythmias: Secondary | ICD-10-CM | POA: Diagnosis not present

## 2017-06-19 DIAGNOSIS — Z6839 Body mass index (BMI) 39.0-39.9, adult: Secondary | ICD-10-CM | POA: Diagnosis not present

## 2017-06-19 DIAGNOSIS — I129 Hypertensive chronic kidney disease with stage 1 through stage 4 chronic kidney disease, or unspecified chronic kidney disease: Secondary | ICD-10-CM | POA: Diagnosis not present

## 2017-06-22 DIAGNOSIS — Z6839 Body mass index (BMI) 39.0-39.9, adult: Secondary | ICD-10-CM | POA: Diagnosis not present

## 2017-06-22 DIAGNOSIS — I129 Hypertensive chronic kidney disease with stage 1 through stage 4 chronic kidney disease, or unspecified chronic kidney disease: Secondary | ICD-10-CM | POA: Diagnosis not present

## 2017-06-22 DIAGNOSIS — I480 Paroxysmal atrial fibrillation: Secondary | ICD-10-CM | POA: Diagnosis not present

## 2017-06-22 DIAGNOSIS — I498 Other specified cardiac arrhythmias: Secondary | ICD-10-CM | POA: Diagnosis not present

## 2017-06-22 DIAGNOSIS — R0609 Other forms of dyspnea: Secondary | ICD-10-CM | POA: Diagnosis not present

## 2017-07-12 DIAGNOSIS — C50919 Malignant neoplasm of unspecified site of unspecified female breast: Secondary | ICD-10-CM | POA: Diagnosis not present

## 2017-07-12 DIAGNOSIS — N183 Chronic kidney disease, stage 3 (moderate): Secondary | ICD-10-CM | POA: Diagnosis not present

## 2017-07-12 DIAGNOSIS — Z6838 Body mass index (BMI) 38.0-38.9, adult: Secondary | ICD-10-CM | POA: Diagnosis not present

## 2017-07-12 DIAGNOSIS — I129 Hypertensive chronic kidney disease with stage 1 through stage 4 chronic kidney disease, or unspecified chronic kidney disease: Secondary | ICD-10-CM | POA: Diagnosis not present

## 2017-07-12 DIAGNOSIS — E7800 Pure hypercholesterolemia, unspecified: Secondary | ICD-10-CM | POA: Diagnosis not present

## 2017-07-12 DIAGNOSIS — E11319 Type 2 diabetes mellitus with unspecified diabetic retinopathy without macular edema: Secondary | ICD-10-CM | POA: Diagnosis not present

## 2017-07-12 DIAGNOSIS — I480 Paroxysmal atrial fibrillation: Secondary | ICD-10-CM | POA: Diagnosis not present

## 2017-07-12 DIAGNOSIS — Z7901 Long term (current) use of anticoagulants: Secondary | ICD-10-CM | POA: Diagnosis not present

## 2017-07-12 DIAGNOSIS — I509 Heart failure, unspecified: Secondary | ICD-10-CM | POA: Diagnosis not present

## 2017-07-12 DIAGNOSIS — I10 Essential (primary) hypertension: Secondary | ICD-10-CM | POA: Diagnosis not present

## 2017-07-12 DIAGNOSIS — E1129 Type 2 diabetes mellitus with other diabetic kidney complication: Secondary | ICD-10-CM | POA: Diagnosis not present

## 2017-07-12 DIAGNOSIS — E038 Other specified hypothyroidism: Secondary | ICD-10-CM | POA: Diagnosis not present

## 2017-07-12 DIAGNOSIS — N3281 Overactive bladder: Secondary | ICD-10-CM | POA: Diagnosis not present

## 2017-07-13 ENCOUNTER — Ambulatory Visit (INDEPENDENT_AMBULATORY_CARE_PROVIDER_SITE_OTHER): Payer: Medicare Other | Admitting: Cardiovascular Disease

## 2017-07-13 ENCOUNTER — Encounter: Payer: Self-pay | Admitting: Cardiovascular Disease

## 2017-07-13 VITALS — BP 136/90 | HR 88 | Ht 64.0 in | Wt 231.6 lb

## 2017-07-13 DIAGNOSIS — I4821 Permanent atrial fibrillation: Secondary | ICD-10-CM

## 2017-07-13 DIAGNOSIS — I10 Essential (primary) hypertension: Secondary | ICD-10-CM | POA: Diagnosis not present

## 2017-07-13 DIAGNOSIS — Z1212 Encounter for screening for malignant neoplasm of rectum: Secondary | ICD-10-CM | POA: Diagnosis not present

## 2017-07-13 DIAGNOSIS — I482 Chronic atrial fibrillation: Secondary | ICD-10-CM | POA: Diagnosis not present

## 2017-07-13 DIAGNOSIS — I340 Nonrheumatic mitral (valve) insufficiency: Secondary | ICD-10-CM | POA: Diagnosis not present

## 2017-07-13 DIAGNOSIS — R001 Bradycardia, unspecified: Secondary | ICD-10-CM

## 2017-07-13 DIAGNOSIS — I5033 Acute on chronic diastolic (congestive) heart failure: Secondary | ICD-10-CM

## 2017-07-13 LAB — IFOBT (OCCULT BLOOD): IMMUNOLOGICAL FECAL OCCULT BLOOD TEST: NEGATIVE

## 2017-07-13 NOTE — Progress Notes (Signed)
Chief Complaint  Patient presents with  . Follow-up    atrial fib   History of Present Illness: 83 yo female with with history of atrial fibrillation, DM, hypothyroidism, HTN, HLD, Vitamin B12 deficiency, iron deficiency here today for cardiac followup. I saw her in October 2012 as a new patient. She presented for her stress test as ordered by Dr. Osborne Casco and was found to be in atrial fibrillation. She had no awareness of palpitations. No stress induced ischemia on nuclear study. Low risk study. Normal LVEF. I started Toprol XL and Pradaxa. Echo on 01/12/11 with normal LV size and function, LVEF of 55-60%, mild LVH, moderate TR, mild MR. She was cardioverted on 02/27/11. She has been on Eliquis. She was recently seen in primary care and was c/o dyspnea and weakness. HR was in the 40s on 06/22/17 with atrial fib. Her Toprol dose was reduced to 25 mg daily. She was felt to be volume overloaded and was started on Lasix 20 mg daily.   She is here today for follow up. The patient denies any chest pain, dyspnea, palpitations, lower extremity edema, orthopnea, PND, dizziness, near syncope or syncope. She is feeling much better. She is still taking Lasix 20 mg daily. She had labs yesterday in primary care.   Primary Care Physician: Haywood Pao, MD  Past Medical History:  Diagnosis Date  . Atrial fibrillation ([place])   . Breast cancer ([place])   . Breast cancer, left ([place])   . Chronic kidney disease    stage III - patient was unaware  . Diabetes mellitus   . Dyspnea   . Dysrhythmia    Afib  . H/O: hysterectomy   . History of colonoscopy 01/25/2010  . History of mammogram 08/04/2009  . Hyperlipidemia   . Hypertension   . Hypothyroidism   . Ketoacidosis, diabetic, no coma, non-insulin dependent    Type II  . Vitamin B12 deficiency     Past Surgical History:  Procedure Laterality Date  . ABDOMINAL HYSTERECTOMY    . Bilateral foot surgery    . BREAST EXCISIONAL BIOPSY    . BREAST  LUMPECTOMY Left    2017  . BREAST LUMPECTOMY WITH RADIOACTIVE SEED AND SENTINEL LYMPH NODE BIOPSY Left 01/19/2016   Procedure: LEFT BREAST LUMPECTOMY WITH RADIOACTIVE SEED AND SENTINEL LYMPH NODE BIOPSY;  Surgeon: Autumn Messing III, MD;  Location: [facility];  Service: General;  Laterality: Left;  . BREAST LUMPECTOMY WITH RADIOACTIVE SEED LOCALIZATION Left 01/19/2016  . CARDIOVERSION  02/27/2011   Procedure: CARDIOVERSION;  Surgeon: Loralie Champagne, MD;  Location: [facility];  Service: Cardiovascular;  Laterality: N/A;  . LAPAROSCOPIC CHOLECYSTECTOMY    . ROTATOR CUFF REPAIR Left   . TUBAL LIGATION      Current Outpatient Medications  Medication Sig Dispense Refill  . anastrozole (ARIMIDEX) 1 MG tablet Take 1 tablet (1 mg total) by mouth daily. 90 tablet 3  . apixaban (ELIQUIS) 5 MG TABS tablet Take 1 tablet (5 mg total) by mouth 2 (two) times daily. 60 tablet   . cyanocobalamin 1000 MCG tablet Take 1,000 mcg by mouth daily.     . ferrous sulfate 325 (65 FE) MG tablet Take 325 mg by mouth daily with breakfast.      . glimepiride (AMARYL) 2 MG tablet Take 2 mg by mouth daily with breakfast.     . levothyroxine (SYNTHROID, LEVOTHROID) 125 MCG tablet Take 125 mcg by mouth daily.      Marland Kitchen losartan (COZAAR) 50 MG  tablet Take 50 mg by mouth daily.    . metFORMIN (GLUCOPHAGE) 850 MG tablet Take 850 mg by mouth 2 (two) times daily with a meal.      . metoprolol succinate (TOPROL-XL) 25 MG 24 hr tablet Take 1 tablet by mouth daily.  5  . Multiple Vitamins-Minerals (MULTIVITAMIN WITH MINERALS) tablet Take 1 tablet by mouth daily.      Nelva Nay SOLOSTAR 300 UNIT/ML SOPN Inject 16 Units into the skin daily.   1   No current facility-administered medications for this visit.     Allergies  Allergen Reactions  . Bee Venom Nausea And Vomiting    Pt feels faint  . Penicillins Anaphylaxis, Hives and Other (See Comments)    THROAT CLOSES LIPS SWELL TURNS PURPLE FEEL FAINT    Social History   Socioeconomic  History  . Marital status: Married    Spouse name: Not on file  . Number of children: 2  . Years of education: Not on file  . Highest education level: Not on file  Occupational History    Employer: OTHER    Comment: Worked at [employer]  . Financial resource strain: Not on file  . Food insecurity:    Worry: Not on file    Inability: Not on file  . Transportation needs:    Medical: Not on file    Non-medical: Not on file  Tobacco Use  . Smoking status: Never Smoker  . Smokeless tobacco: Never Used  Substance and Sexual Activity  . Alcohol use: No    Alcohol/week: 0.0 oz  . Drug use: No  . Sexual activity: Not on file  Lifestyle  . Physical activity:    Days per week: Not on file    Minutes per session: Not on file  . Stress: Not on file  Relationships  . Social connections:    Talks on phone: Not on file    Gets together: Not on file    Attends religious service: Not on file    Active member of club or organization: Not on file    Attends meetings of clubs or organizations: Not on file    Relationship status: Not on file  . Intimate partner violence:    Fear of current or ex partner: Not on file    Emotionally abused: Not on file    Physically abused: Not on file    Forced sexual activity: Not on file  Other Topics Concern  . Not on file  Social History Narrative   Patient since 42   Husband with prostate cancer   10-siblings-no cancer    Family History  Problem Relation Age of Onset  . Heart failure Father 91       enlarged heart  . Pneumonia Mother 37  . Diabetes Mother 12  . Breast cancer Sister   . Cancer Neg Hx     Review of Systems:  As stated in the HPI and otherwise negative.   BP 136/90   Pulse 88   Ht 5\' 4"  (1.626 m)   Wt 231 lb 9.6 oz (105.1 kg)   LMP  (LMP Unknown)   SpO2 97%   BMI 39.75 kg/m   Physical Examination:  General: Well developed, well nourished, NAD  HEENT: OP clear, mucus membranes moist  SKIN: warm, dry.  No rashes. Neuro: No focal deficits  Musculoskeletal: Muscle strength 5/5 all ext  Psychiatric: Mood and affect normal  Neck: No JVD, no carotid bruits, no  thyromegaly, no lymphadenopathy.  Lungs:Clear bilaterally, no wheezes, rhonci, crackles Cardiovascular: Irregular. No murmurs, gallops or rubs. Abdomen:Soft. Bowel sounds present. Non-tender.  Extremities: No lower extremity edema. Pulses are 2 + in the bilateral DP/PT.  EKG:  EKG is  ordered today. The ekg ordered today demonstrates Atrial fib rate 88 bpm. Poor R wave progression septal leads.   Recent Labs: No results found for requested labs within last 8760 hours.   Lipid Panel No results found for: CHOL, TRIG, HDL, CHOLHDL, VLDL, LDLCALC, LDLDIRECT   Wt Readings from Last 3 Encounters:  07/13/17 231 lb 9.6 oz (105.1 kg)  11/16/16 232 lb (105.2 kg)  05/26/16 233 lb (105.7 kg)     Other studies Reviewed: Additional studies/ records that were reviewed today include: . Review of the above records demonstrates:    Assessment and Plan:   1. Atrial fibrillation, permanent: She is in atrial fibrillation today with controlled rate. Will continue Toprol 25 mg daily. Given recent bradycardia on Toprol 50 mg, this is concerning for tachy/brady and future bradycardic events. Will continue Toprol 25 mg daily. 48 hour cardiac monitor to follow HR. Continue Eliquis.    2. Bradycardia: See above. Resolved with lowering her Toprol to 25 mg daily. No indication for a pacemaker at this time  3. HTN: BP controlled. No changes.   4. Mitral valve insufficiency: Mild by echo in 2012. Will repeat echo now  5. Acute on chronic diastolic CHF: Volume controlled with Lasix 20 mg daily. BMET yesterday in primary care. We will try to get this to check on her renal function.   Current medicines are reviewed at length with the patient today.  The patient does not have concerns regarding medicines.  The following changes have been made:  no  change  Labs/ tests ordered today include:   No orders of the defined types were placed in this encounter.   Disposition:   FU with me in 12  months  Signed, Lauree Chandler, MD 07/13/2017 2:21 PM    [facility] [place], [place], [place]  [address] Phone: [phone]; Fax: [phone]

## 2017-07-13 NOTE — Patient Instructions (Signed)
Medication Instructions:  Your provider recommends that you continue on your current medications as directed. Please refer to the Current Medication list given to you today.    Labwork: None  Testing/Procedures: Your physician has recommended that you wear a holter monitor. Holter monitors are medical devices that record the heart's electrical activity. Doctors most often use these monitors to diagnose arrhythmias. Arrhythmias are problems with the speed or rhythm of the heartbeat. The monitor is a small, portable device. You can wear one while you do your normal daily activities. This is usually used to diagnose what is causing palpitations/syncope (passing out).   Your provider has requested that you have an echocardiogram. Echocardiography is a painless test that uses sound waves to create images of your heart. It provides your doctor with information about the size and shape of your heart and how well your heart's chambers and valves are working. This procedure takes approximately one hour. There are no restrictions for this procedure.  Follow-Up: Your provider recommends that you schedule a follow-up appointment in: 2-3 weeks with Dr. Camillia Herter assistant.   Any Other Special Instructions Will Be Listed Below (If Applicable).     If you need a refill on your cardiac medications before your next appointment, please call your pharmacy.

## 2017-07-26 ENCOUNTER — Ambulatory Visit (HOSPITAL_COMMUNITY): Payer: Medicare Other | Attending: Cardiovascular Disease

## 2017-07-26 ENCOUNTER — Other Ambulatory Visit: Payer: Self-pay

## 2017-07-26 ENCOUNTER — Ambulatory Visit (INDEPENDENT_AMBULATORY_CARE_PROVIDER_SITE_OTHER): Payer: Medicare Other

## 2017-07-26 DIAGNOSIS — I4821 Permanent atrial fibrillation: Secondary | ICD-10-CM

## 2017-07-26 DIAGNOSIS — I340 Nonrheumatic mitral (valve) insufficiency: Secondary | ICD-10-CM

## 2017-07-26 DIAGNOSIS — R001 Bradycardia, unspecified: Secondary | ICD-10-CM

## 2017-07-26 DIAGNOSIS — I119 Hypertensive heart disease without heart failure: Secondary | ICD-10-CM | POA: Diagnosis not present

## 2017-07-26 DIAGNOSIS — E785 Hyperlipidemia, unspecified: Secondary | ICD-10-CM | POA: Diagnosis not present

## 2017-07-26 DIAGNOSIS — I482 Chronic atrial fibrillation: Secondary | ICD-10-CM | POA: Insufficient documentation

## 2017-07-26 MED ORDER — PERFLUTREN LIPID MICROSPHERE
1.0000 mL | INTRAVENOUS | Status: AC | PRN
  Administered 2017-07-26: 1 mL via INTRAVENOUS
  Administered 2017-07-26: 2 mL via INTRAVENOUS

## 2017-08-08 ENCOUNTER — Ambulatory Visit (INDEPENDENT_AMBULATORY_CARE_PROVIDER_SITE_OTHER): Payer: Medicare Other | Admitting: Physician Assistant

## 2017-08-08 ENCOUNTER — Encounter: Payer: Self-pay | Admitting: Physician Assistant

## 2017-08-08 VITALS — BP 146/100 | HR 107 | Ht 64.0 in | Wt 223.2 lb

## 2017-08-08 DIAGNOSIS — I482 Chronic atrial fibrillation, unspecified: Secondary | ICD-10-CM

## 2017-08-08 DIAGNOSIS — I5032 Chronic diastolic (congestive) heart failure: Secondary | ICD-10-CM | POA: Diagnosis not present

## 2017-08-08 DIAGNOSIS — I10 Essential (primary) hypertension: Secondary | ICD-10-CM | POA: Diagnosis not present

## 2017-08-08 MED ORDER — FUROSEMIDE 20 MG PO TABS: 20.0000 mg | ORAL_TABLET | ORAL | 9 refills | Status: DC | PRN

## 2017-08-08 MED ORDER — METOPROLOL SUCCINATE ER 50 MG PO TB24: 50.0000 mg | ORAL_TABLET | Freq: Every day | ORAL | 11 refills | Status: DC

## 2017-08-08 NOTE — Patient Instructions (Addendum)
Medication Instructions:  Your physician has recommended you make the following change in your medication:  1. Increase Toprol (50 mg) daily. Sent in today to patient's requested pharmacy. 2. Change Lasix to as needed with the intentions of following the criteria below.   Labwork: -None   Testing/Procedures: -None  Follow-Up: Your physician wants you to follow-up in: 1 year with Dr. Angelena Form.  You will receive a reminder letter in the mail two months in advance. If you don't receive a letter, please call our office to schedule the follow-up appointment.   Any Other Special Instructions Will Be Listed Below (If Applicable).  If heart rate gets too fast or too slow please, below 50 or above 100,  BP  above135/85 and weight gain of 2-3 lbs overnite or 5 lbs in one week please call [facility], Dr. Camillia Herter nurse at [phone].      Two Gram Sodium Diet 2000 mg  What is Sodium? Sodium is a mineral found naturally in many foods. The most significant source of sodium in the diet is table salt, which is about 40% sodium.  Processed, convenience, and preserved foods also contain a large amount of sodium.  The body needs only 500 mg of sodium daily to function,  A normal diet provides more than enough sodium even if you do not use salt.  Why Limit Sodium? A build up of sodium in the body can cause thirst, increased blood pressure, shortness of breath, and water retention.  Decreasing sodium in the diet can reduce edema and risk of heart attack or stroke associated with high blood pressure.  Keep in mind that there are many other factors involved in these health problems.  Heredity, obesity, lack of exercise, cigarette smoking, stress and what you eat all play a role.  General Guidelines:  Do not add salt at the table or in cooking.  One teaspoon of salt contains over 2 grams of sodium.  Read food labels  Avoid processed and convenience foods  Ask your dietitian before eating any foods not  dicussed in the menu planning guidelines  Consult your physician if you wish to use a salt substitute or a sodium containing medication such as antacids.  Limit milk and milk products to 16 oz (2 cups) per day.  Shopping Hints:  READ LABELS!! "Dietetic" does not necessarily mean low sodium.  Salt and other sodium ingredients are often added to foods during processing.   Menu Planning Guidelines Food Group Choose More Often Avoid  Beverages (see also the milk group All fruit juices, low-sodium, salt-free vegetables juices, low-sodium carbonated beverages Regular vegetable or tomato juices, commercially softened water used for drinking or cooking  Breads and Cereals Enriched white, wheat, rye and pumpernickel bread, hard rolls and dinner rolls; muffins, cornbread and waffles; most dry cereals, cooked cereal without added salt; unsalted crackers and breadsticks; low sodium or homemade bread crumbs Bread, rolls and crackers with salted tops; quick breads; instant hot cereals; pancakes; commercial bread stuffing; self-rising flower and biscuit mixes; regular bread crumbs or cracker crumbs  Desserts and Sweets Desserts and sweets mad with mild should be within allowance Instant pudding mixes and cake mixes  Fats Butter or margarine; vegetable oils; unsalted salad dressings, regular salad dressings limited to 1 Tbs; light, sour and heavy cream Regular salad dressings containing bacon fat, bacon bits, and salt pork; snack dips made with instant soup mixes or processed cheese; salted nuts  Fruits Most fresh, frozen and canned fruits Fruits processed with salt or  sodium-containing ingredient (some dried fruits are processed with sodium sulfites        Vegetables Fresh, frozen vegetables and low- sodium canned vegetables Regular canned vegetables, sauerkraut, pickled vegetables, and others prepared in brine; frozen vegetables in sauces; vegetables seasoned with ham, bacon or salt pork  Condiments,  Sauces, Miscellaneous  Salt substitute with physician's approval; pepper, herbs, spices; vinegar, lemon or lime juice; hot pepper sauce; garlic powder, onion powder, low sodium soy sauce (1 Tbs.); low sodium condiments (ketchup, chili sauce, mustard) in limited amounts (1 tsp.) fresh ground horseradish; unsalted tortilla chips, pretzels, potato chips, popcorn, salsa (1/4 cup) Any seasoning made with salt including garlic salt, celery salt, onion salt, and seasoned salt; sea salt, rock salt, kosher salt; meat tenderizers; monosodium glutamate; mustard, regular soy sauce, barbecue, sauce, chili sauce, teriyaki sauce, steak sauce, Worcestershire sauce, and most flavored vinegars; canned gravy and mixes; regular condiments; salted snack foods, olives, picles, relish, horseradish sauce, catsup   Food preparation: Try these seasonings Meats:    Pork Sage, onion Serve with applesauce  Chicken Poultry seasoning, thyme, parsley Serve with cranberry sauce  Lamb Curry powder, rosemary, garlic, thyme Serve with mint sauce or jelly  Veal Marjoram, basil Serve with current jelly, cranberry sauce  Beef Pepper, bay leaf Serve with dry mustard, unsalted chive butter  Fish [place] leaf, dill Serve with unsalted lemon butter, unsalted parsley butter  Vegetables:    Asparagus Lemon juice   Broccoli Lemon juice   Carrots Mustard dressing parsley, mint, nutmeg, glazed with unsalted butter and sugar   Green beans Marjoram, lemon juice, nutmeg,dill seed   Tomatoes Basil, marjoram, onion   Spice /blend for [employer]" 4 tsp ground thyme 1 tsp ground sage 3 tsp ground rosemary 4 tsp ground marjoram   Test your knowledge 1. A product that says "Salt Free" may still contain sodium. True or False 2. Garlic Powder and Hot Pepper Sauce an be used as alternative seasonings.True or False 3. Processed foods have more sodium than fresh foods.  True or False 4. Canned Vegetables have less sodium than froze True or False  WAYS  TO DECREASE YOUR SODIUM INTAKE 1. Avoid the use of added salt in cooking and at the table.  Table salt (and other prepared seasonings which contain salt) is probably one of the greatest sources of sodium in the diet.  Unsalted foods can gain flavor from the sweet, sour, and butter taste sensations of herbs and spices.  Instead of using salt for seasoning, try the following seasonings with the foods listed.  Remember: how you use them to enhance natural food flavors is limited only by your creativity... Allspice-Meat, fish, eggs, fruit, peas, red and yellow vegetables Almond Extract-Fruit baked goods Anise Seed-Sweet breads, fruit, carrots, beets, cottage cheese, cookies (tastes like licorice) Basil-Meat, fish, eggs, vegetables, rice, vegetables salads, soups, sauces Bay Leaf-Meat, fish, stews, poultry Burnet-Salad, vegetables (cucumber-like flavor) Caraway Seed-Bread, cookies, cottage cheese, meat, vegetables, cheese, rice Cardamon-Baked goods, fruit, soups Celery Powder or seed-Salads, salad dressings, sauces, meatloaf, soup, bread.Do not use  celery salt Chervil-Meats, salads, fish, eggs, vegetables, cottage cheese (parsley-like flavor) Chili Power-Meatloaf, chicken cheese, corn, eggplant, egg dishes Chives-Salads cottage cheese, egg dishes, soups, vegetables, sauces Cilantro-Salsa, casseroles Cinnamon-Baked goods, fruit, pork, lamb, chicken, carrots Cloves-Fruit, baked goods, fish, pot roast, green beans, beets, carrots Coriander-Pastry, cookies, meat, salads, cheese (lemon-orange flavor) Cumin-Meatloaf, fish,cheese, eggs, cabbage,fruit pie (caraway flavor) [employer], fruit, eggs, fish, poultry, cottage cheese, vegetables Dill Seed-Meat, cottage cheese, poultry, vegetables, fish,  salads, bread Fennel Seed-Bread, cookies, apples, pork, eggs, fish, beets, cabbage, cheese, Licorice-like flavor Garlic-(buds or powder) Salads, meat, poultry, fish, bread, butter, vegetables, potatoes.Do  not  use garlic salt Ginger-Fruit, vegetables, baked goods, meat, fish, poultry Horseradish Root-Meet, vegetables, butter Lemon Juice or Extract-Vegetables, fruit, tea, baked goods, fish salads Mace-Baked goods fruit, vegetables, fish, poultry (taste like nutmeg) Maple Extract-Syrups Marjoram-Meat, chicken, fish, vegetables, breads, green salads (taste like Sage) Mint-Tea, lamb, sherbet, vegetables, desserts, carrots, cabbage Mustard, Dry or Seed-Cheese, eggs, meats, vegetables, poultry Nutmeg-Baked goods, fruit, chicken, eggs, vegetables, desserts Onion Powder-Meat, fish, poultry, vegetables, cheese, eggs, bread, rice salads (Do not use   Onion salt) Orange Extract-Desserts, baked goods Oregano-Pasta, eggs, cheese, onions, pork, lamb, fish, chicken, vegetables, green salads Paprika-Meat, fish, poultry, eggs, cheese, vegetables Parsley Flakes-Butter, vegetables, meat fish, poultry, eggs, bread, salads (certain forms may   Contain sodium Pepper-Meat fish, poultry, vegetables, eggs Peppermint Extract-Desserts, baked goods Poppy Seed-Eggs, bread, cheese, fruit dressings, baked goods, noodles, vegetables, cottage  [employer], poultry, meat, fish, cauliflower, turnips,eggs bread Saffron-Rice, bread, veal, chicken, fish, eggs Sage-Meat, fish, poultry, onions, eggplant, tomateos, pork, stews Savory-Eggs, salads, poultry, meat, rice, vegetables, soups, pork Tarragon-Meat, poultry, fish, eggs, butter, vegetables (licorice-like flavor)  Thyme-Meat, poultry, fish, eggs, vegetables, (clover-like flavor), sauces, soups Tumeric-Salads, butter, eggs, fish, rice, vegetables (saffron-like flavor) Vanilla Extract-Baked goods, candy Vinegar-Salads, vegetables, meat marinades Walnut Extract-baked goods, candy  2. Choose your Foods Wisely   The following is a list of foods to avoid which are high in sodium:  Meats-Avoid all smoked, canned, salt cured, dried and  kosher meat and fish as well as Anchovies   [employer] meats:Bologna, Liverwurst, Pastrami Canned meat or fish  Marinated herring Caviar    Pepperoni Corned Beef   Pizza Dried chipped beef  Salami Frozen breaded fish or meat Salt pork Frankfurters or hot dogs  Sardines Gefilte fish   Sausage Ham (boiled ham, Proscuitto Smoked butt    spiced ham)   Spam      TV Dinners Vegetables Canned vegetables (Regular) Relish Canned mushrooms  Sauerkraut Olives    Tomato juice Pickles  Bakery and Dessert Products Canned puddings  Cream pies Cheesecake   Decorated cakes Cookies  Beverages/Juices Tomato juice, regular  Gatorade   V-8 vegetable juice, regular  Breads and Cereals Biscuit mixes   Salted potato chips, corn chips, pretzels Bread stuffing mixes  Salted crackers and rolls Pancake and waffle mixes Self-rising flour  Seasonings Accent    Meat sauces Barbecue sauce  Meat tenderizer Catsup    Monosodium glutamate (MSG) Celery salt   Onion salt Chili sauce   Prepared mustard Garlic salt   Salt, seasoned salt, sea salt Gravy mixes   Soy sauce Horseradish   Steak sauce Ketchup   Tartar sauce Lite salt    Teriyaki sauce Marinade mixes   Worcestershire sauce  Others Baking powder   Cocoa and cocoa mixes Baking soda   Commercial casserole mixes Candy-caramels, chocolate  Dehydrated soups    Bars, fudge,nougats  Instant rice and pasta mixes Canned broth or soup  Maraschino cherries Cheese, aged and processed cheese and cheese spreads  Learning Assessment Quiz  Indicated T (for True) or F (for False) for each of the following statements:  1. _____ Fresh fruits and vegetables and unprocessed grains are generally low in sodium 2. _____ Water may contain a considerable amount of sodium, depending on the source 3. _____ You can always tell if a food  is high in sodium by tasting it 4. _____ Certain laxatives my be high in sodium and should be avoided unless  prescribed   by a physician or pharmacist 5. _____ Salt substitutes may be used freely by anyone on a sodium restricted diet 6. _____ Sodium is present in table salt, food additives and as a natural component of   most foods 7. _____ Table salt is approximately 90% sodium 8. _____ Limiting sodium intake may help prevent excess fluid accumulation in the body 9. _____ On a sodium-restricted diet, seasonings such as bouillon soy sauce, and    cooking wine should be used in place of table salt 10. _____ On an ingredient list, a product which lists monosodium glutamate as the first   ingredient is an appropriate food to include on a low sodium diet  Circle the best answer(s) to the following statements (Hint: there may be more than one correct answer)  11. On a low-sodium diet, some acceptable snack items are:    A. Olives  F. Bean dip   K. Grapefruit juice    B. Salted Pretzels G. Commercial Popcorn   L. Canned peaches    C. Carrot Sticks  H. Bouillon   M. Unsalted nuts   D. [place] fries  I. Peanut butter crackers N. Salami   E. Sweet pickles J. Tomato Juice   O. Pizza  12.  Seasonings that may be used freely on a reduced - sodium diet include   A. Lemon wedges F.Monosodium glutamate K. Celery seed    B.Soysauce   G. Pepper   L. Mustard powder   C. Sea salt  H. Cooking wine  M. Onion flakes   D. Vinegar  E. Prepared horseradish N. Salsa   E. Sage   J. Worcestershire sauce  O. Chutney  If you need a refill on your cardiac medications before your next appointment, please call your pharmacy.

## 2017-08-08 NOTE — Progress Notes (Signed)
Cardiology Office Note    Date:  08/08/2017   ID:  SENG FOUTS, DOB 1934-06-12, MRN [id number]  PCP:  Haywood Pao, MD  Cardiologist: Lauree Chandler, MD  Chief Complaint  Patient presents with  . Follow-up    History of Present Illness:  Summer Hawkins is a 83 y.o. female with history of atrial fibrillation status post cardioversion 2012 on Eliquis.  Heart rate slow in the 40s 06/22/2017 and Toprol decreased to 25 mg daily.  Also had fluid overload and was started on Lasix.  Has hypertension, HLD.  Patient saw Dr. Angelena Form 07/13/2017 which time she was in atrial fibrillation with controlled rate.  2D echo was ordered and showed normal LV function EF 55 to 60% with mild LVH and only mildly thickened mitral valve.  48-hour monitor was also ordered to rule out tachybradycardia.  She had atrial fibrillation with no rates below 57 bpm.  There were 1072 PVCs noted during the 48-hour period.  Dr. Angelena Form reviewed and did not recommend any changes.  Patient comes in today for follow-up.  She feels better than she has in a long time.  She has lost 8 pounds on the Lasix.  She does not like taking Lasix because she is going to the bathroom every 15 minutes for 6 hours.  Blood pressure is up today and pulse was 96 when she walked in here.  She eats out regularly at places such as [place].  She does not add salt.    Past Medical History:  Diagnosis Date  . Atrial fibrillation ([place])   . Breast cancer ([place])   . Breast cancer, left ([place])   . Chronic kidney disease    stage III - patient was unaware  . Diabetes mellitus   . Dyspnea   . Dysrhythmia    Afib  . H/O: hysterectomy   . History of colonoscopy 01/25/2010  . History of mammogram 08/04/2009  . Hyperlipidemia   . Hypertension   . Hypothyroidism   . Ketoacidosis, diabetic, no coma, non-insulin dependent    Type II  . Vitamin B12 deficiency     Past Surgical History:  Procedure Laterality Date  . ABDOMINAL  HYSTERECTOMY    . Bilateral foot surgery    . BREAST EXCISIONAL BIOPSY    . BREAST LUMPECTOMY Left    2017  . BREAST LUMPECTOMY WITH RADIOACTIVE SEED AND SENTINEL LYMPH NODE BIOPSY Left 01/19/2016   Procedure: LEFT BREAST LUMPECTOMY WITH RADIOACTIVE SEED AND SENTINEL LYMPH NODE BIOPSY;  Surgeon: Autumn Messing III, MD;  Location: [facility];  Service: General;  Laterality: Left;  . BREAST LUMPECTOMY WITH RADIOACTIVE SEED LOCALIZATION Left 01/19/2016  . CARDIOVERSION  02/27/2011   Procedure: CARDIOVERSION;  Surgeon: Loralie Champagne, MD;  Location: [facility];  Service: Cardiovascular;  Laterality: N/A;  . LAPAROSCOPIC CHOLECYSTECTOMY    . ROTATOR CUFF REPAIR Left   . TUBAL LIGATION      Current Medications: Current Meds  Medication Sig  . anastrozole (ARIMIDEX) 1 MG tablet Take 1 tablet (1 mg total) by mouth daily.  Marland Kitchen apixaban (ELIQUIS) 5 MG TABS tablet Take 1 tablet (5 mg total) by mouth 2 (two) times daily.  . cyanocobalamin 1000 MCG tablet Take 1,000 mcg by mouth daily.   . ferrous sulfate 325 (65 FE) MG tablet Take 325 mg by mouth daily with breakfast.    . furosemide (LASIX) 20 MG tablet Take 1 tablet (20 mg total) by mouth as needed for fluid or edema.  Marland Kitchen  glimepiride (AMARYL) 2 MG tablet Take 2 mg by mouth daily with breakfast.   . levothyroxine (SYNTHROID, LEVOTHROID) 125 MCG tablet Take 125 mcg by mouth daily.    Marland Kitchen losartan (COZAAR) 50 MG tablet Take 50 mg by mouth daily.  . metFORMIN (GLUCOPHAGE) 850 MG tablet Take 850 mg by mouth 2 (two) times daily with a meal.    . metoprolol succinate (TOPROL-XL) 25 MG 24 hr tablet Take 1 tablet by mouth daily.  . Multiple Vitamins-Minerals (MULTIVITAMIN WITH MINERALS) tablet Take 1 tablet by mouth daily.    Nelva Nay SOLOSTAR 300 UNIT/ML SOPN Inject 16 Units into the skin daily.   . [DISCONTINUED] furosemide (LASIX) 20 MG tablet Take 20 mg by mouth daily.     Allergies:   Bee venom and Penicillins   Social History   Socioeconomic History  . Marital  status: Married    Spouse name: Not on file  . Number of children: 2  . Years of education: Not on file  . Highest education level: Not on file  Occupational History    Employer: OTHER    Comment: Worked at [employer]  . Financial resource strain: Not on file  . Food insecurity:    Worry: Not on file    Inability: Not on file  . Transportation needs:    Medical: Not on file    Non-medical: Not on file  Tobacco Use  . Smoking status: Never Smoker  . Smokeless tobacco: Never Used  Substance and Sexual Activity  . Alcohol use: No    Alcohol/week: 0.0 oz  . Drug use: No  . Sexual activity: Not on file  Lifestyle  . Physical activity:    Days per week: Not on file    Minutes per session: Not on file  . Stress: Not on file  Relationships  . Social connections:    Talks on phone: Not on file    Gets together: Not on file    Attends religious service: Not on file    Active member of club or organization: Not on file    Attends meetings of clubs or organizations: Not on file    Relationship status: Not on file  Other Topics Concern  . Not on file  Social History Narrative   Patient since 89   Husband with prostate cancer   10-siblings-no cancer     Family History:  The patient's family history includes Breast cancer in her sister; Diabetes (age of onset: 28) in her mother; Heart failure (age of onset: 23) in her father; Pneumonia (age of onset: 41) in her mother.   ROS:   Please see the history of present illness.    Review of Systems  Constitution: Negative.  HENT: Negative.   Eyes: Negative.   Cardiovascular: Positive for irregular heartbeat.  Respiratory: Negative.   Hematologic/Lymphatic: Negative.   Musculoskeletal: Negative.  Negative for joint pain.  Gastrointestinal: Negative.   Genitourinary: Negative.   Neurological: Negative.    All other systems reviewed and are negative.   PHYSICAL EXAM:   VS:  BP (!) 146/100   Pulse (!) 107   Ht 5\' 4"   (1.626 m)   Wt 223 lb 3.2 oz (101.2 kg)   LMP  (LMP Unknown)   SpO2 96%   BMI 38.31 kg/m   Physical Exam  GEN: Obese, in no acute distress  Neck: no JVD, carotid bruits, or masses Cardiac: Irregular irregular, distant heart sounds Respiratory:  clear to auscultation bilaterally,  normal work of breathing GI: soft, nontender, nondistended, + BS Ext: without cyanosis, clubbing, or edema, Good distal pulses bilaterally Neuro:  Alert and Oriented x 3 Psych: euthymic mood, full affect  Wt Readings from Last 3 Encounters:  08/08/17 223 lb 3.2 oz (101.2 kg)  07/13/17 231 lb 9.6 oz (105.1 kg)  11/16/16 232 lb (105.2 kg)      Studies/Labs Reviewed:   EKG:  EKG is not ordered today.   Recent Labs: No results found for requested labs within last 8760 hours.   Lipid Panel No results found for: CHOL, TRIG, HDL, CHOLHDL, VLDL, LDLCALC, LDLDIRECT  Additional studies/ records that were reviewed today include:  2D echo 5/16/2019Study Conclusions   - Left ventricle: The cavity size was normal. Wall thickness was   increased in a pattern of mild LVH. Systolic function was normal.   The estimated ejection fraction was in the range of 55% to 60%.   Wall motion was normal; there were no regional wall motion   abnormalities. - Mitral valve: Mildly to moderately calcified annulus. Mildly   thickened leaflets . - Left atrium: The atrium was mildly dilated. - Pulmonary arteries: Systolic pressure was mildly increased. PA   peak pressure: 36 mm Hg (S).   48-hour monitor 5/16/2019Study Highlights   Atrial fibrillation during monitoring period. No heart rate below 57 bpm.  PVCs (1072 during 48 hour monitoring period)         ASSESSMENT:    1. Chronic atrial fibrillation ([place])   2. Chronic diastolic CHF (congestive heart failure) ([place])   3. Essential hypertension      PLAN:  In order of problems listed above:  Chronic diastolic CHF 2D echo showed normal LVEF 55 to 60% has lost 8  pounds on low-dose Lasix.  She did like to stop this.  I told her it is reasonable to take as needed if she follows a low-sodium diet but she does eat out a lot.  She has trouble with appointments and her insurance company paying for them so she would rather call us with her weight and fluid status then schedule an appointment.  I told her it is reasonable to try to take the Lasix as needed and to call Doristine Locks, RN with an update.  Essential hypertension blood pressure is up today.  It was 140/90 when I retook it.  Pulse was also elevated when she walked in here.  Discussed with Dr. Angelena Form who feels it is reasonable to try to increase her Toprol-XL to 50 mg once daily.  2 g sodium diet given.  Chronic atrial fibrillation on Eliquis.  Recent 48-hour monitor did not show any heart rates below 57.  Heart rate was 92 when she walked in here.  We will try to increase Toprol-XL 50 mg once daily.  Patient will call us with heart rates from home.    Medication Adjustments/Labs and Tests Ordered: Current medicines are reviewed at length with the patient today.  Concerns regarding medicines are outlined above.  Medication changes, Labs and Tests ordered today are listed in the Patient Instructions below. Patient Instructions   Medication Instructions:  Your physician has recommended you make the following change in your medication:  1. Increase Toprol (50 mg) daily. Sent in today to patient's requested pharmacy. 2. Change Lasix to as needed with the intentions of following the criteria below.   Labwork: -None   Testing/Procedures: -None  Follow-Up: Your physician wants you to follow-up in: 1 year with Dr.  McAlhany.  You will receive a reminder letter in the mail two months in advance. If you don't receive a letter, please call our office to schedule the follow-up appointment.   Any Other Special Instructions Will Be Listed Below (If Applicable).  If heart rate gets too fast or too slow  please, below 50 or above 100,  BP  above135/85 and weight gain of 2-3 lbs overnite or 5 lbs in one week please call Fraser Din, Dr. Camillia Herter nurse at [phone].      Two Gram Sodium Diet 2000 mg  What is Sodium? Sodium is a mineral found naturally in many foods. The most significant source of sodium in the diet is table salt, which is about 40% sodium.  Processed, convenience, and preserved foods also contain a large amount of sodium.  The body needs only 500 mg of sodium daily to function,  A normal diet provides more than enough sodium even if you do not use salt.  Why Limit Sodium? A build up of sodium in the body can cause thirst, increased blood pressure, shortness of breath, and water retention.  Decreasing sodium in the diet can reduce edema and risk of heart attack or stroke associated with high blood pressure.  Keep in mind that there are many other factors involved in these health problems.  Heredity, obesity, lack of exercise, cigarette smoking, stress and what you eat all play a role.  General Guidelines:  Do not add salt at the table or in cooking.  One teaspoon of salt contains over 2 grams of sodium.  Read food labels  Avoid processed and convenience foods  Ask your dietitian before eating any foods not dicussed in the menu planning guidelines  Consult your physician if you wish to use a salt substitute or a sodium containing medication such as antacids.  Limit milk and milk products to 16 oz (2 cups) per day.  Shopping Hints:  READ LABELS!! "Dietetic" does not necessarily mean low sodium.  Salt and other sodium ingredients are often added to foods during processing.   Menu Planning Guidelines Food Group Choose More Often Avoid  Beverages (see also the milk group All fruit juices, low-sodium, salt-free vegetables juices, low-sodium carbonated beverages Regular vegetable or tomato juices, commercially softened water used for drinking or cooking  Breads and Cereals  Enriched white, wheat, rye and pumpernickel bread, hard rolls and dinner rolls; muffins, cornbread and waffles; most dry cereals, cooked cereal without added salt; unsalted crackers and breadsticks; low sodium or homemade bread crumbs Bread, rolls and crackers with salted tops; quick breads; instant hot cereals; pancakes; commercial bread stuffing; self-rising flower and biscuit mixes; regular bread crumbs or cracker crumbs  Desserts and Sweets Desserts and sweets mad with mild should be within allowance Instant pudding mixes and cake mixes  Fats Butter or margarine; vegetable oils; unsalted salad dressings, regular salad dressings limited to 1 Tbs; light, sour and heavy cream Regular salad dressings containing bacon fat, bacon bits, and salt pork; snack dips made with instant soup mixes or processed cheese; salted nuts  Fruits Most fresh, frozen and canned fruits Fruits processed with salt or sodium-containing ingredient (some dried fruits are processed with sodium sulfites        Vegetables Fresh, frozen vegetables and low- sodium canned vegetables Regular canned vegetables, sauerkraut, pickled vegetables, and others prepared in brine; frozen vegetables in sauces; vegetables seasoned with ham, bacon or salt pork  Condiments, Sauces, Miscellaneous  Salt substitute with physician's approval; pepper, herbs, spices; vinegar,  lemon or lime juice; hot pepper sauce; garlic powder, onion powder, low sodium soy sauce (1 Tbs.); low sodium condiments (ketchup, chili sauce, mustard) in limited amounts (1 tsp.) fresh ground horseradish; unsalted tortilla chips, pretzels, potato chips, popcorn, salsa (1/4 cup) Any seasoning made with salt including garlic salt, celery salt, onion salt, and seasoned salt; sea salt, rock salt, kosher salt; meat tenderizers; monosodium glutamate; mustard, regular soy sauce, barbecue, sauce, chili sauce, teriyaki sauce, steak sauce, Worcestershire sauce, and most flavored vinegars;  canned gravy and mixes; regular condiments; salted snack foods, olives, picles, relish, horseradish sauce, catsup   Food preparation: Try these seasonings Meats:    Pork Sage, onion Serve with applesauce  Chicken Poultry seasoning, thyme, parsley Serve with cranberry sauce  Lamb Curry powder, rosemary, garlic, thyme Serve with mint sauce or jelly  Veal Marjoram, basil Serve with current jelly, cranberry sauce  Beef Pepper, bay leaf Serve with dry mustard, unsalted chive butter  Fish [place] leaf, dill Serve with unsalted lemon butter, unsalted parsley butter  Vegetables:    Asparagus Lemon juice   Broccoli Lemon juice   Carrots Mustard dressing parsley, mint, nutmeg, glazed with unsalted butter and sugar   Green beans Marjoram, lemon juice, nutmeg,dill seed   Tomatoes Basil, marjoram, onion   Spice /blend for [employer]" 4 tsp ground thyme 1 tsp ground sage 3 tsp ground rosemary 4 tsp ground marjoram   Test your knowledge 1. A product that says "Salt Free" may still contain sodium. True or False 2. Garlic Powder and Hot Pepper Sauce an be used as alternative seasonings.True or False 3. Processed foods have more sodium than fresh foods.  True or False 4. Canned Vegetables have less sodium than froze True or False  WAYS TO DECREASE YOUR SODIUM INTAKE 1. Avoid the use of added salt in cooking and at the table.  Table salt (and other prepared seasonings which contain salt) is probably one of the greatest sources of sodium in the diet.  Unsalted foods can gain flavor from the sweet, sour, and butter taste sensations of herbs and spices.  Instead of using salt for seasoning, try the following seasonings with the foods listed.  Remember: how you use them to enhance natural food flavors is limited only by your creativity... Allspice-Meat, fish, eggs, fruit, peas, red and yellow vegetables Almond Extract-Fruit baked goods Anise Seed-Sweet breads, fruit, carrots, beets, cottage cheese, cookies  (tastes like licorice) Basil-Meat, fish, eggs, vegetables, rice, vegetables salads, soups, sauces Bay Leaf-Meat, fish, stews, poultry Burnet-Salad, vegetables (cucumber-like flavor) Caraway Seed-Bread, cookies, cottage cheese, meat, vegetables, cheese, rice Cardamon-Baked goods, fruit, soups Celery Powder or seed-Salads, salad dressings, sauces, meatloaf, soup, bread.Do not use  celery salt Chervil-Meats, salads, fish, eggs, vegetables, cottage cheese (parsley-like flavor) Chili Power-Meatloaf, chicken cheese, corn, eggplant, egg dishes Chives-Salads cottage cheese, egg dishes, soups, vegetables, sauces Cilantro-Salsa, casseroles Cinnamon-Baked goods, fruit, pork, lamb, chicken, carrots Cloves-Fruit, baked goods, fish, pot roast, green beans, beets, carrots Coriander-Pastry, cookies, meat, salads, cheese (lemon-orange flavor) Cumin-Meatloaf, fish,cheese, eggs, cabbage,fruit pie (caraway flavor) [employer], fruit, eggs, fish, poultry, cottage cheese, vegetables Dill Seed-Meat, cottage cheese, poultry, vegetables, fish, salads, bread Fennel Seed-Bread, cookies, apples, pork, eggs, fish, beets, cabbage, cheese, Licorice-like flavor Garlic-(buds or powder) Salads, meat, poultry, fish, bread, butter, vegetables, potatoes.Do not  use garlic salt Ginger-Fruit, vegetables, baked goods, meat, fish, poultry Horseradish Root-Meet, vegetables, butter Lemon Juice or Extract-Vegetables, fruit, tea, baked goods, fish salads Mace-Baked goods fruit, vegetables, fish, poultry (taste like nutmeg) Maple Extract-Syrups Marjoram-Meat,  chicken, fish, vegetables, breads, green salads (taste like Sage) Mint-Tea, lamb, sherbet, vegetables, desserts, carrots, cabbage Mustard, Dry or Seed-Cheese, eggs, meats, vegetables, poultry Nutmeg-Baked goods, fruit, chicken, eggs, vegetables, desserts Onion Powder-Meat, fish, poultry, vegetables, cheese, eggs, bread, rice salads (Do not use   Onion salt) Orange  Extract-Desserts, baked goods Oregano-Pasta, eggs, cheese, onions, pork, lamb, fish, chicken, vegetables, green salads Paprika-Meat, fish, poultry, eggs, cheese, vegetables Parsley Flakes-Butter, vegetables, meat fish, poultry, eggs, bread, salads (certain forms may   Contain sodium Pepper-Meat fish, poultry, vegetables, eggs Peppermint Extract-Desserts, baked goods Poppy Seed-Eggs, bread, cheese, fruit dressings, baked goods, noodles, vegetables, cottage  [employer], poultry, meat, fish, cauliflower, turnips,eggs bread Saffron-Rice, bread, veal, chicken, fish, eggs Sage-Meat, fish, poultry, onions, eggplant, tomateos, pork, stews Savory-Eggs, salads, poultry, meat, rice, vegetables, soups, pork Tarragon-Meat, poultry, fish, eggs, butter, vegetables (licorice-like flavor)  Thyme-Meat, poultry, fish, eggs, vegetables, (clover-like flavor), sauces, soups Tumeric-Salads, butter, eggs, fish, rice, vegetables (saffron-like flavor) Vanilla Extract-Baked goods, candy Vinegar-Salads, vegetables, meat marinades Walnut Extract-baked goods, candy  2. Choose your Foods Wisely   The following is a list of foods to avoid which are high in sodium:  Meats-Avoid all smoked, canned, salt cured, dried and kosher meat and fish as well as Anchovies   [employer] meats:Bologna, Liverwurst, Pastrami Canned meat or fish  Marinated herring Caviar    Pepperoni Corned Beef   Pizza Dried chipped beef  Salami Frozen breaded fish or meat Salt pork Frankfurters or hot dogs  Sardines Gefilte fish   Sausage Ham (boiled ham, Proscuitto Smoked butt    spiced ham)   Spam      TV Dinners Vegetables Canned vegetables (Regular) Relish Canned mushrooms  Sauerkraut Olives    Tomato juice Pickles  Bakery and Dessert Products Canned puddings  Cream pies Cheesecake   Decorated cakes Cookies  Beverages/Juices Tomato juice, regular  Gatorade   V-8 vegetable  juice, regular  Breads and Cereals Biscuit mixes   Salted potato chips, corn chips, pretzels Bread stuffing mixes  Salted crackers and rolls Pancake and waffle mixes Self-rising flour  Seasonings Accent    Meat sauces Barbecue sauce  Meat tenderizer Catsup    Monosodium glutamate (MSG) Celery salt   Onion salt Chili sauce   Prepared mustard Garlic salt   Salt, seasoned salt, sea salt Gravy mixes   Soy sauce Horseradish   Steak sauce Ketchup   Tartar sauce Lite salt    Teriyaki sauce Marinade mixes   Worcestershire sauce  Others Baking powder   Cocoa and cocoa mixes Baking soda   Commercial casserole mixes Candy-caramels, chocolate  Dehydrated soups    Bars, fudge,nougats  Instant rice and pasta mixes Canned broth or soup  Maraschino cherries Cheese, aged and processed cheese and cheese spreads  Learning Assessment Quiz  Indicated T (for True) or F (for False) for each of the following statements:  1. _____ Fresh fruits and vegetables and unprocessed grains are generally low in sodium 2. _____ Water may contain a considerable amount of sodium, depending on the source 3. _____ You can always tell if a food is high in sodium by tasting it 4. _____ Certain laxatives my be high in sodium and should be avoided unless prescribed   by a physician or pharmacist 5. _____ Salt substitutes may be used freely by anyone on a sodium restricted diet 6. _____ Sodium is present in table salt, food additives and as a natural component of   most  foods 7. _____ Table salt is approximately 90% sodium 8. _____ Limiting sodium intake may help prevent excess fluid accumulation in the body 9. _____ On a sodium-restricted diet, seasonings such as bouillon soy sauce, and    cooking wine should be used in place of table salt 10. _____ On an ingredient list, a product which lists monosodium glutamate as the first   ingredient is an appropriate food to include on a low sodium diet  Circle the best  answer(s) to the following statements (Hint: there may be more than one correct answer)  11. On a low-sodium diet, some acceptable snack items are:    A. Olives  F. Bean dip   K. Grapefruit juice    B. Salted Pretzels G. Commercial Popcorn   L. Canned peaches    C. Carrot Sticks  H. Bouillon   M. Unsalted nuts   D. [place] fries  I. Peanut butter crackers N. Salami   E. Sweet pickles J. Tomato Juice   O. Pizza  12.  Seasonings that may be used freely on a reduced - sodium diet include   A. Lemon wedges F.Monosodium glutamate K. Celery seed    B.Soysauce   G. Pepper   L. Mustard powder   C. Sea salt  H. Cooking wine  M. Onion flakes   D. Vinegar  E. Prepared horseradish N. Salsa   E. Sage   J. Worcestershire sauce  O. Chutney  If you need a refill on your cardiac medications before your next appointment, please call your pharmacy.      Sumner Boast, PA-C  08/08/2017 11:27 AM    [facility] [place], [place], [place]  [address] Phone: [phone]; Fax: [phone]

## 2017-08-09 ENCOUNTER — Telehealth: Payer: Self-pay | Admitting: Physician Assistant

## 2017-08-09 NOTE — Telephone Encounter (Signed)
Spoke with [employer] (Chance).  Pt at store to pick up medications. Thought there were 2 for her to pick up but they only have one (Toprol XL 50 mg) Advised according to ov note from yesterday, the Toprol XL was increased and the lasix was adjusted but no new script was sent in for lasix. Advised Chance of this information.

## 2017-08-09 NOTE — Telephone Encounter (Signed)
Chance from [place] stated pt is at pharmacy and stated she is suppose to  Have 2 prescriptions from her visit with [facility] yesterday. But pharmacy only has one for Metoprolol 50mg 

## 2017-09-19 DIAGNOSIS — N183 Chronic kidney disease, stage 3 (moderate): Secondary | ICD-10-CM | POA: Diagnosis not present

## 2017-09-19 DIAGNOSIS — Z6838 Body mass index (BMI) 38.0-38.9, adult: Secondary | ICD-10-CM | POA: Diagnosis not present

## 2017-09-19 DIAGNOSIS — I509 Heart failure, unspecified: Secondary | ICD-10-CM | POA: Diagnosis not present

## 2017-09-19 DIAGNOSIS — R5381 Other malaise: Secondary | ICD-10-CM | POA: Diagnosis not present

## 2017-09-19 DIAGNOSIS — E038 Other specified hypothyroidism: Secondary | ICD-10-CM | POA: Diagnosis not present

## 2017-09-19 DIAGNOSIS — I10 Essential (primary) hypertension: Secondary | ICD-10-CM | POA: Diagnosis not present

## 2017-09-19 DIAGNOSIS — Z7901 Long term (current) use of anticoagulants: Secondary | ICD-10-CM | POA: Diagnosis not present

## 2017-09-19 DIAGNOSIS — I498 Other specified cardiac arrhythmias: Secondary | ICD-10-CM | POA: Diagnosis not present

## 2017-09-25 ENCOUNTER — Inpatient Hospital Stay (HOSPITAL_COMMUNITY)
Admission: EM | Admit: 2017-09-25 | Discharge: 2017-09-27 | DRG: 308 | Disposition: A | Discharge status: Home / Self Care | Payer: Medicare Other | Attending: Cardiovascular Disease | Admitting: Cardiovascular Disease

## 2017-09-25 ENCOUNTER — Other Ambulatory Visit: Payer: Self-pay

## 2017-09-25 ENCOUNTER — Encounter (HOSPITAL_COMMUNITY): Payer: Self-pay

## 2017-09-25 ENCOUNTER — Emergency Department (HOSPITAL_COMMUNITY): Payer: Medicare Other

## 2017-09-25 DIAGNOSIS — E785 Hyperlipidemia, unspecified: Secondary | ICD-10-CM | POA: Diagnosis present

## 2017-09-25 DIAGNOSIS — Z91030 Bee allergy status: Secondary | ICD-10-CM

## 2017-09-25 DIAGNOSIS — Z90710 Acquired absence of both cervix and uterus: Secondary | ICD-10-CM

## 2017-09-25 DIAGNOSIS — Z8249 Family history of ischemic heart disease and other diseases of the circulatory system: Secondary | ICD-10-CM

## 2017-09-25 DIAGNOSIS — I4821 Permanent atrial fibrillation: Secondary | ICD-10-CM | POA: Insufficient documentation

## 2017-09-25 DIAGNOSIS — Z803 Family history of malignant neoplasm of breast: Secondary | ICD-10-CM

## 2017-09-25 DIAGNOSIS — Z79890 Hormone replacement therapy (postmenopausal): Secondary | ICD-10-CM

## 2017-09-25 DIAGNOSIS — I481 Persistent atrial fibrillation: Secondary | ICD-10-CM | POA: Diagnosis present

## 2017-09-25 DIAGNOSIS — Z7901 Long term (current) use of anticoagulants: Secondary | ICD-10-CM

## 2017-09-25 DIAGNOSIS — Z794 Long term (current) use of insulin: Secondary | ICD-10-CM | POA: Diagnosis not present

## 2017-09-25 DIAGNOSIS — N183 Chronic kidney disease, stage 3 (moderate): Secondary | ICD-10-CM | POA: Diagnosis present

## 2017-09-25 DIAGNOSIS — I5033 Acute on chronic diastolic (congestive) heart failure: Secondary | ICD-10-CM | POA: Diagnosis present

## 2017-09-25 DIAGNOSIS — I482 Chronic atrial fibrillation: Secondary | ICD-10-CM | POA: Diagnosis not present

## 2017-09-25 DIAGNOSIS — I509 Heart failure, unspecified: Secondary | ICD-10-CM | POA: Insufficient documentation

## 2017-09-25 DIAGNOSIS — Z880 Allergy status to penicillin: Secondary | ICD-10-CM | POA: Diagnosis not present

## 2017-09-25 DIAGNOSIS — E1122 Type 2 diabetes mellitus with diabetic chronic kidney disease: Secondary | ICD-10-CM | POA: Diagnosis present

## 2017-09-25 DIAGNOSIS — Z7984 Long term (current) use of oral hypoglycemic drugs: Secondary | ICD-10-CM | POA: Diagnosis not present

## 2017-09-25 DIAGNOSIS — I130 Hypertensive heart and chronic kidney disease with heart failure and stage 1 through stage 4 chronic kidney disease, or unspecified chronic kidney disease: Secondary | ICD-10-CM | POA: Diagnosis present

## 2017-09-25 DIAGNOSIS — E039 Hypothyroidism, unspecified: Secondary | ICD-10-CM | POA: Diagnosis present

## 2017-09-25 DIAGNOSIS — R001 Bradycardia, unspecified: Secondary | ICD-10-CM | POA: Diagnosis not present

## 2017-09-25 DIAGNOSIS — R0602 Shortness of breath: Secondary | ICD-10-CM | POA: Diagnosis not present

## 2017-09-25 DIAGNOSIS — I5031 Acute diastolic (congestive) heart failure: Secondary | ICD-10-CM | POA: Diagnosis not present

## 2017-09-25 DIAGNOSIS — Z833 Family history of diabetes mellitus: Secondary | ICD-10-CM | POA: Diagnosis not present

## 2017-09-25 DIAGNOSIS — E0590 Thyrotoxicosis, unspecified without thyrotoxic crisis or storm: Secondary | ICD-10-CM | POA: Diagnosis present

## 2017-09-25 DIAGNOSIS — I4891 Unspecified atrial fibrillation: Secondary | ICD-10-CM | POA: Diagnosis not present

## 2017-09-25 DIAGNOSIS — I10 Essential (primary) hypertension: Secondary | ICD-10-CM | POA: Diagnosis not present

## 2017-09-25 DIAGNOSIS — Z853 Personal history of malignant neoplasm of breast: Secondary | ICD-10-CM

## 2017-09-25 DIAGNOSIS — I110 Hypertensive heart disease with heart failure: Secondary | ICD-10-CM | POA: Diagnosis not present

## 2017-09-25 DIAGNOSIS — Z9049 Acquired absence of other specified parts of digestive tract: Secondary | ICD-10-CM

## 2017-09-25 DIAGNOSIS — I495 Sick sinus syndrome: Secondary | ICD-10-CM | POA: Diagnosis not present

## 2017-09-25 DIAGNOSIS — Z9114 Patient's other noncompliance with medication regimen: Secondary | ICD-10-CM | POA: Diagnosis not present

## 2017-09-25 LAB — CBG MONITORING, ED: GLUCOSE-CAPILLARY: 145 mg/dL — AB (ref 70–99)

## 2017-09-25 LAB — CBC
HEMATOCRIT: 40.1 % (ref 36.0–46.0)
HEMOGLOBIN: 12.6 g/dL (ref 12.0–15.0)
MCH: 30.1 pg (ref 26.0–34.0)
MCHC: 31.4 g/dL (ref 30.0–36.0)
MCV: 95.7 fL (ref 78.0–100.0)
Platelets: 221 10*3/uL (ref 150–400)
RBC: 4.19 MIL/uL (ref 3.87–5.11)
RDW: 13 % (ref 11.5–15.5)
WBC: 9.8 10*3/uL (ref 4.0–10.5)

## 2017-09-25 LAB — PROTIME-INR
INR: 1.19
Prothrombin Time: 15 seconds (ref 11.4–15.2)

## 2017-09-25 LAB — BASIC METABOLIC PANEL
ANION GAP: 10 (ref 5–15)
BUN: 24 mg/dL — ABNORMAL HIGH (ref 8–23)
CHLORIDE: 108 mmol/L (ref 98–111)
CO2: 22 mmol/L (ref 22–32)
Calcium: 9.2 mg/dL (ref 8.9–10.3)
Creatinine, Ser: 1.27 mg/dL — ABNORMAL HIGH (ref 0.44–1.00)
GFR, EST AFRICAN AMERICAN: 45 mL/min — AB (ref >60)
GFR, EST NON AFRICAN AMERICAN: 38 mL/min — AB (ref >60)
Glucose, Bld: 217 mg/dL — ABNORMAL HIGH (ref 70–99)
POTASSIUM: 4 mmol/L (ref 3.5–5.1)
SODIUM: 140 mmol/L (ref 135–145)

## 2017-09-25 MED ORDER — INSULIN ASPART 100 UNIT/ML ~~LOC~~ SOLN
0.0000 [IU] | Freq: Three times a day (TID) | SUBCUTANEOUS | Status: DC
  Administered 2017-09-26: 3 [IU] via SUBCUTANEOUS
  Administered 2017-09-26: 8 [IU] via SUBCUTANEOUS
  Administered 2017-09-27 (×2): 3 [IU] via SUBCUTANEOUS

## 2017-09-25 MED ORDER — FUROSEMIDE 10 MG/ML IJ SOLN
40.0000 mg | Freq: Once | INTRAMUSCULAR | Status: AC
  Administered 2017-09-26: 40 mg via INTRAVENOUS
  Filled 2017-09-25: qty 4

## 2017-09-25 NOTE — ED Notes (Signed)
Called Patient to reassess vitals x3 and had no response. 

## 2017-09-25 NOTE — ED Notes (Signed)
Pt stated she takes Lasix and wished to be placed on a peerwick so this was completed per her request

## 2017-09-25 NOTE — ED Notes (Signed)
RN attempted to give report, RN unavailable  

## 2017-09-25 NOTE — ED Provider Notes (Signed)
[facility] EMERGENCY DEPARTMENT Provider Note   CSN: [id number] Arrival date & time: 09/25/17  1440     History   Chief Complaint Chief Complaint  Patient presents with  . Shortness of Breath    HPI Summer Hawkins is a 83 y.o. female.  Patient is an 83 year old female with a history of atrial fibrillation on Eliquis, hypertension, hyperlipidemia, chronic kidney disease and diabetes who is presenting today with worsening exertional dyspnea.  Patient states that in May she saw Dr. Angelena Form and at that time had an echo which showed an EF between 55 and 60%.  She states with atrial fibrillation she has struggled with having fast heart rate and that time had her metoprolol increased to 50 mg long acting which she takes daily.  She also had increase of her Synthroid because they felt like her thyroid was off.  At that time she was also started on Lasix which she has been taking daily.  She has been checking her weight regularly and has not noted any increased weight.  She is continued to take her medications as prescribed but states over the last few weeks she has increased swelling of her legs and exertional dyspnea.  She states now she is at the point where anytime she walks more than 5-10 steps she becomes very winded and has to sit down.  Also over the last few weeks she has had more episodes of feeling lightheaded like she might pass out.  She denies any chest pain, fevers, cough.  She has had no abdominal pain, nausea or vomiting.  She has eating and drinking normally.  The history is provided by the patient.  Shortness of Breath  This is a new problem. The average episode lasts 2 weeks. The problem occurs continuously.Episode onset: 2 weeks ago. The problem has been gradually worsening. Associated symptoms include leg swelling. Pertinent negatives include no fever, no cough, no PND, no orthopnea and no chest pain. Associated symptoms comments: SOB on exertion.  No can only  walk 5-10 steps and is short of breath. It is unknown what precipitated the problem. Treatments tried: lasix. The treatment provided no relief.    Past Medical History:  Diagnosis Date  . Atrial fibrillation ([place])   . Breast cancer ([place])   . Breast cancer, left ([place])   . Chronic kidney disease    stage III - patient was unaware  . Diabetes mellitus   . Dyspnea   . Dysrhythmia    Afib  . H/O: hysterectomy   . History of colonoscopy 01/25/2010  . History of mammogram 08/04/2009  . Hyperlipidemia   . Hypertension   . Hypothyroidism   . Ketoacidosis, diabetic, no coma, non-insulin dependent    Type II  . Vitamin B12 deficiency     Patient Active Problem List   Diagnosis Date Noted  . Bradycardia 09/25/2017  . Acute congestive heart failure ([place])   . Permanent atrial fibrillation ([place])   . Chronic diastolic CHF (congestive heart failure) ([place]) 08/08/2017  . Essential hypertension 08/08/2017  . Breast cancer of upper-outer quadrant of left female breast ([place]) 01/05/2016  . Chronic atrial fibrillation ([place]) 01/05/2011    Past Surgical History:  Procedure Laterality Date  . ABDOMINAL HYSTERECTOMY    . Bilateral foot surgery    . BREAST EXCISIONAL BIOPSY    . BREAST LUMPECTOMY Left    2017  . BREAST LUMPECTOMY WITH RADIOACTIVE SEED AND SENTINEL LYMPH NODE BIOPSY Left 01/19/2016   Procedure:  LEFT BREAST LUMPECTOMY WITH RADIOACTIVE SEED AND SENTINEL LYMPH NODE BIOPSY;  Surgeon: Autumn Messing III, MD;  Location: [facility];  Service: General;  Laterality: Left;  . BREAST LUMPECTOMY WITH RADIOACTIVE SEED LOCALIZATION Left 01/19/2016  . CARDIOVERSION  02/27/2011   Procedure: CARDIOVERSION;  Surgeon: Loralie Champagne, MD;  Location: [facility];  Service: Cardiovascular;  Laterality: N/A;  . LAPAROSCOPIC CHOLECYSTECTOMY    . ROTATOR CUFF REPAIR Left   . TUBAL LIGATION       OB History   None      Home Medications    Prior to Admission medications   Medication Sig Start Date End Date  Taking? Authorizing Provider  anastrozole (ARIMIDEX) 1 MG tablet Take 1 tablet (1 mg total) by mouth daily. 11/16/16  Yes Nicholas Lose, MD  apixaban (ELIQUIS) 5 MG TABS tablet Take 1 tablet (5 mg total) by mouth 2 (two) times daily. 11/16/16  Yes Nicholas Lose, MD  cyanocobalamin 1000 MCG tablet Take 1,000 mcg by mouth daily.    Yes [provider]  ferrous sulfate 325 (65 FE) MG tablet Take 325 mg by mouth daily with breakfast.     Yes [provider]  furosemide (LASIX) 20 MG tablet Take 1 tablet (20 mg total) by mouth as needed for fluid or edema. 08/08/17  Yes Imogene Burn, PA-C  glimepiride (AMARYL) 2 MG tablet Take 2 mg by mouth daily with breakfast.  09/11/11  Yes [provider]  levothyroxine (SYNTHROID, LEVOTHROID) 125 MCG tablet Take 125 mcg by mouth daily.     Yes [provider]  losartan (COZAAR) 50 MG tablet Take 50 mg by mouth daily. 10/18/13  Yes [provider]  metFORMIN (GLUCOPHAGE) 850 MG tablet Take 850 mg by mouth daily with breakfast.    Yes [provider]  metoprolol succinate (TOPROL-XL) 50 MG 24 hr tablet Take 1 tablet (50 mg total) by mouth daily. Take with or immediately following a meal. 08/08/17 11/06/17 Yes Imogene Burn, PA-C  Multiple Vitamins-Minerals (MULTIVITAMIN WITH MINERALS) tablet Take 1 tablet by mouth daily.     Yes [provider]  TOUJEO SOLOSTAR 300 UNIT/ML SOPN Inject 26 Units into the skin daily.  03/12/15  Yes [provider]    Family History Family History  Problem Relation Age of Onset  . Heart failure Father 49       enlarged heart  . Pneumonia Mother 61  . Diabetes Mother 53  . Breast cancer Sister   . Cancer Neg Hx     Social History Social History   Tobacco Use  . Smoking status: Never Smoker  . Smokeless tobacco: Never Used  Substance Use Topics  . Alcohol use: No    Alcohol/week: 0.0 oz  . Drug use: No     Allergies   Bee venom and  Penicillins   Review of Systems Review of Systems  Constitutional: Negative for fever.  Respiratory: Positive for shortness of breath. Negative for cough.   Cardiovascular: Positive for leg swelling. Negative for chest pain, orthopnea and PND.  All other systems reviewed and are negative.    Physical Exam Updated Vital Signs BP (!) 190/71   Pulse (!) 43   Temp 98.7 F (37.1 C) (Oral)   Resp 17   LMP  (LMP Unknown)   SpO2 100%   Physical Exam  Constitutional: She is oriented to person, place, and time. She appears well-developed and well-nourished. No distress.  HENT:  Head: Normocephalic and atraumatic.  Mouth/Throat: Oropharynx is clear and moist.  Eyes: Pupils are equal, round, and reactive to light. Conjunctivae and EOM are normal.  Neck: Normal range of motion. Neck supple.  Cardiovascular: Intact distal pulses. An irregularly irregular rhythm present. Bradycardia present.  No murmur heard. Pulmonary/Chest: Effort normal and breath sounds normal. No respiratory distress. She has no wheezes. She has no rales.  Abdominal: Soft. She exhibits no distension. There is no tenderness. There is no rebound and no guarding.  Musculoskeletal: Normal range of motion. She exhibits no tenderness.       Right lower leg: She exhibits edema.       Left lower leg: She exhibits edema.  1+ pitting edema bilateral lower extremities.  Neurological: She is alert and oriented to person, place, and time.  Skin: Skin is warm and dry. No rash noted. No erythema.  Psychiatric: She has a normal mood and affect. Her behavior is normal.  Nursing note and vitals reviewed.    ED Treatments / Results  Labs (all labs ordered are listed, but only abnormal results are displayed) Labs Reviewed  BASIC METABOLIC PANEL - Abnormal; Notable for the following components:      Result Value   Glucose, Bld 217 (*)    BUN 24 (*)    Creatinine, Ser 1.27 (*)    GFR calc non Af Amer 38 (*)    GFR calc Af Amer  45 (*)    All other components within normal limits  CBG MONITORING, ED - Abnormal; Notable for the following components:   Glucose-Capillary 145 (*)    All other components within normal limits  CBC  PROTIME-INR    EKG EKG Interpretation  Date/Time:  Tuesday September 25 2017 14:47:56 EDT Ventricular Rate:  45 PR Interval:    QRS Duration: 74 QT Interval:  492 QTC Calculation: 425 R Axis:   73 Text Interpretation:  Atrial fibrillation with slow ventricular response Low voltage QRS Cannot rule out Anterior infarct , age undetermined Confirmed by Blanchie Dessert ([id number]) on 09/25/2017 8:25:19 PM   Radiology Dg Chest 2 View  Result Date: 09/25/2017 CLINICAL DATA:  Shortness of breath. EXAM: CHEST - 2 VIEW COMPARISON:  03/04/2011. FINDINGS: Surgical clips are noted over the chest. Cardiomegaly. Mild basilar interstitial prominence. Mild CHF cannot be excluded. No pleural effusion or pneumothorax. Degenerative change thoracic spine. IMPRESSION: Cardiomegaly with mild bilateral interstitial prominence. Mild CHF cannot be excluded. Electronically Signed   By: Marcello Moores  Register   On: 09/25/2017 15:56    Procedures Procedures (including critical care time)  Medications Ordered in ED Medications  furosemide (LASIX) injection 40 mg (has no administration in time range)     Initial Impression / Assessment and Plan / ED Course  I have reviewed the triage vital signs and the nursing notes.  Pertinent labs & imaging results that were available during my care of the patient were reviewed by me and considered in my medical decision making (see chart for details).     Patient presenting today with symptoms of exertional dyspnea and evidence of fluid overload on exam and by history.  Patient on x-ray shows some mild CHF and mild elevation of her creatinine to 1.26.  However feel patient's fluid overload is most likely related to her symptomatic bradycardia.  This may be related to her metoprolol  but also with a history of atrial fibrillation she may have sick sinus syndrome and require a pacemaker.  She has no infectious symptoms today and is otherwise stable at  rest.  Discussed with cardiology who will admit the patient for further care.  Patient recently had her thyroid checked and had adjustment of her levothyroxine but do not feel that this is the cause of her bradycardia today.  No evidence of anemia.  Final Clinical Impressions(s) / ED Diagnoses   Final diagnoses:  Acute congestive heart failure, unspecified heart failure type [facility])  Bradycardia    ED Discharge Orders    None       Blanchie Dessert, MD 09/25/17 2323

## 2017-09-25 NOTE — H&P (Signed)
Admit date: 09/25/2017 Referring Physician: Dr. Maryan Rued Primary Cardiologist: Dr. Angelena Form Chief complaint/reason for admission:Bradycardia and atrial fibrillation  HPI: Summer Hawkins is a 83 y.o. female who is being seen today for the evaluation of bradycardia at the request of Dr. Maryan Rued.  This is an 83yo female with a history of atrial fibrillation s/p remote DCCV in 2012.  She has been on anticoagulation with Eliquis for CHADS2VASC score of 5.  She also has a history of DM, HTN, hypothyroidism and CKD stage 3.  She apparently has had some problems with her thyroid recently and her synthroid was increased for a while and then TSH came back low and her synthroid was decreased.    In April 2019, her HR was in the 40's and Toprol was decreased to 25mg  daily.  She was also dx with diastolic CHF as that time and started on Lasix but she says that she could not stand urinating so much so she stopped it and only has been taking it when she gets edema or SOB.  She saw Dr. Angelena Form back 07/13/2017 and her HR was well controlled. 2D echo showed normal LVF with EF 55-60%.  A 48 hour Holter was done to assess adequacy of rate control and showed an average heart rate of 94bpm with no HR below 57bpm.    She was seen back by Estella Husk, PA in the office 08/08/2017 feeling well and weight was down 8lbs.  Her HR was 96bpm so her lopressor was increased to 50mg  daily.  Her BP was also elevated at 140/21mmHg.  She was instructed to follow a low sodium diet.    She was doing well until about 2 weeks ago when she started having fatigue, increased SOB, LE edema and dizziness.  She denies any chest pain and no syncope.  In ER HR was noted to be in the 40's but would dip down into the mid 30's.      PMH:    Past Medical History:  Diagnosis Date  . Atrial fibrillation ([place])   . Breast cancer ([place])   . Breast cancer, left ([place])   . Chronic kidney disease    stage III - patient was unaware  . Diabetes  mellitus   . Dyspnea   . Dysrhythmia    Afib  . H/O: hysterectomy   . History of colonoscopy 01/25/2010  . History of mammogram 08/04/2009  . Hyperlipidemia   . Hypertension   . Hypothyroidism   . Ketoacidosis, diabetic, no coma, non-insulin dependent    Type II  . Vitamin B12 deficiency     PSH:    Past Surgical History:  Procedure Laterality Date  . ABDOMINAL HYSTERECTOMY    . Bilateral foot surgery    . BREAST EXCISIONAL BIOPSY    . BREAST LUMPECTOMY Left    2017  . BREAST LUMPECTOMY WITH RADIOACTIVE SEED AND SENTINEL LYMPH NODE BIOPSY Left 01/19/2016   Procedure: LEFT BREAST LUMPECTOMY WITH RADIOACTIVE SEED AND SENTINEL LYMPH NODE BIOPSY;  Surgeon: Autumn Messing III, MD;  Location: [facility];  Service: General;  Laterality: Left;  . BREAST LUMPECTOMY WITH RADIOACTIVE SEED LOCALIZATION Left 01/19/2016  . CARDIOVERSION  02/27/2011   Procedure: CARDIOVERSION;  Surgeon: Loralie Champagne, MD;  Location: [facility];  Service: Cardiovascular;  Laterality: N/A;  . LAPAROSCOPIC CHOLECYSTECTOMY    . ROTATOR CUFF REPAIR Left   . TUBAL LIGATION      ALLERGIES:   Bee venom and Penicillins  Prior to Admit Meds:   (  Not in a hospital admission) Family HX:    Family History  Problem Relation Age of Onset  . Heart failure Father 44       enlarged heart  . Pneumonia Mother 7  . Diabetes Mother 3  . Breast cancer Sister   . Cancer Neg Hx    Social HX:    Social History   Socioeconomic History  . Marital status: Married    Spouse name: Not on file  . Number of children: 2  . Years of education: Not on file  . Highest education level: Not on file  Occupational History    Employer: OTHER    Comment: Worked at [employer]  . Financial resource strain: Not on file  . Food insecurity:    Worry: Not on file    Inability: Not on file  . Transportation needs:    Medical: Not on file    Non-medical: Not on file  Tobacco Use  . Smoking status: Never Smoker  . Smokeless tobacco: Never  Used  Substance and Sexual Activity  . Alcohol use: No    Alcohol/week: 0.0 oz  . Drug use: No  . Sexual activity: Not on file  Lifestyle  . Physical activity:    Days per week: Not on file    Minutes per session: Not on file  . Stress: Not on file  Relationships  . Social connections:    Talks on phone: Not on file    Gets together: Not on file    Attends religious service: Not on file    Active member of club or organization: Not on file    Attends meetings of clubs or organizations: Not on file    Relationship status: Not on file  . Intimate partner violence:    Fear of current or ex partner: Not on file    Emotionally abused: Not on file    Physically abused: Not on file    Forced sexual activity: Not on file  Other Topics Concern  . Not on file  Social History Narrative   Patient since 40   Husband with prostate cancer   10-siblings-no cancer     ROS:  All ROS were addressed and are negative except what is stated in the HPI  PHYSICAL EXAM Vitals:   09/25/17 2115 09/25/17 2200  BP: (!) 181/69 (!) 190/71  Pulse: (!) 43 (!) 43  Resp: 17   Temp:    SpO2: 98% 100%   General: Well developed, well nourished, in no acute distress Head: Eyes PERRLA, No xanthomas.   Normal cephalic and atramatic  Lungs:   Clear bilaterally to auscultation and percussion. Heart:   Irregularly irregular and brady S1 S2 Pulses are 2+ & equal.            No carotid bruit. No JVD.  No abdominal bruits. No femoral bruits. Abdomen: Bowel sounds are positive, abdomen soft and non-tender without masses or                  Hernia's noted. Msk:  Back normal, normal gait. Normal strength and tone for age. Extremities:   No clubbing, cyanosis.  1-2+ pedal edema.  DP +1 Neuro: Alert and oriented X 3. Psych:  Good affect, responds appropriately   Labs:   Lab Results  Component Value Date   WBC 9.8 09/25/2017   HGB 12.6 09/25/2017   HCT 40.1 09/25/2017   MCV 95.7 09/25/2017   PLT 221  09/25/2017  Recent Labs  Lab 09/25/17 1513  NA 140  K 4.0  CL 108  CO2 22  BUN 24*  CREATININE 1.27*  CALCIUM 9.2  GLUCOSE 217*   Lab Results  Component Value Date   TROPONINI <0.30 03/04/2011   No results found for: PTT Lab Results  Component Value Date   INR 1.19 09/25/2017   INR 1.07 01/20/2016   INR 1.10 01/19/2016    No results found for: CHOL No results found for: HDL No results found for: LDLCALC No results found for: TRIG No results found for: CHOLHDL No results found for: LDLDIRECT    Radiology:  Dg Chest 2 View  Result Date: 09/25/2017 CLINICAL DATA:  Shortness of breath. EXAM: CHEST - 2 VIEW COMPARISON:  03/04/2011. FINDINGS: Surgical clips are noted over the chest. Cardiomegaly. Mild basilar interstitial prominence. Mild CHF cannot be excluded. No pleural effusion or pneumothorax. Degenerative change thoracic spine. IMPRESSION: Cardiomegaly with mild bilateral interstitial prominence. Mild CHF cannot be excluded. Electronically Signed   By: Marcello [place]  Register   On: 09/25/2017 15:56     Telemetry    Atrial fibrillation with slow VR - Personally Reviewed  ECG    Atrial fibrillation with SVR at 45bpm, low voltage QRS and anterior infarct  - Personally Reviewed   ASSESSMENT/PLAN:   1.  Symptomatic bradycardia - HR is in the 40's today with 1-2 week hx of fatigue, dizziness, SOB and LE edema.  Her Toprol was doubled the last of May and sx started the beginning of July.   -admit to tele bed -stop BB -check TSH given recent changes in Synthroid -watch on tele while BB washes out -may need PPM at some point since she likely has tachybrady syndrome  2.  Permanent atrial fibrillation -continue Eliquis 5mg  BID for CHADS2VASC score of 5. -BB on hold for bradycardia  3.  Acute diastolic CHF likely secondary to severe bradycardia -cxray with interstitial edema and she has LE edema on exam -she has only been taking Lasix PRN due to having to use the bathroom so  much -will give Lasix 40mg  IV now and reassess in am -recent 2D echo showed normal LVF  4.  HTN -BP poorly controlled currently -she tells me her BP always runs high at MD office -increase losartan to 100mg  daily daily since we are stopping BB  5.  DM type 2 -continue metformin, glimepiride and Toujeo. -qac and qhs BS checks with SS Insulin coverage  6.  Hypothyroidism -continue synthroid -check TSH   Fransico Him, MD  09/25/2017  11:04 PM

## 2017-09-25 NOTE — ED Triage Notes (Signed)
Pt here for shortness of breath worse with exertion. Pt states she has hx of afib. Pt bradycardic in triage, she does take a beta blocker. Pt denies chest pain.

## 2017-09-26 ENCOUNTER — Other Ambulatory Visit: Payer: Self-pay

## 2017-09-26 DIAGNOSIS — I4891 Unspecified atrial fibrillation: Secondary | ICD-10-CM

## 2017-09-26 DIAGNOSIS — I5031 Acute diastolic (congestive) heart failure: Secondary | ICD-10-CM

## 2017-09-26 DIAGNOSIS — E785 Hyperlipidemia, unspecified: Secondary | ICD-10-CM

## 2017-09-26 DIAGNOSIS — I10 Essential (primary) hypertension: Secondary | ICD-10-CM

## 2017-09-26 LAB — CBC WITH DIFFERENTIAL/PLATELET
Abs Immature Granulocytes: 0 10*3/uL (ref 0.0–0.1)
BASOS ABS: 0.1 10*3/uL (ref 0.0–0.1)
Basophils Relative: 1 %
EOS ABS: 0.4 10*3/uL (ref 0.0–0.7)
EOS PCT: 4 %
HCT: 39.8 % (ref 36.0–46.0)
Hemoglobin: 12.7 g/dL (ref 12.0–15.0)
Immature Granulocytes: 0 %
LYMPHS PCT: 31 %
Lymphs Abs: 2.8 10*3/uL (ref 0.7–4.0)
MCH: 30.7 pg (ref 26.0–34.0)
MCHC: 31.9 g/dL (ref 30.0–36.0)
MCV: 96.1 fL (ref 78.0–100.0)
Monocytes Absolute: 0.9 10*3/uL (ref 0.1–1.0)
Monocytes Relative: 11 %
Neutro Abs: 4.7 10*3/uL (ref 1.7–7.7)
Neutrophils Relative %: 53 %
Platelets: 190 10*3/uL (ref 150–400)
RBC: 4.14 MIL/uL (ref 3.87–5.11)
RDW: 13 % (ref 11.5–15.5)
WBC: 8.8 10*3/uL (ref 4.0–10.5)

## 2017-09-26 LAB — HEMOGLOBIN A1C
Hgb A1c MFr Bld: 7.7 % — ABNORMAL HIGH (ref 4.8–5.6)
MEAN PLASMA GLUCOSE: 174.29 mg/dL

## 2017-09-26 LAB — BASIC METABOLIC PANEL
Anion gap: 11 (ref 5–15)
BUN: 26 mg/dL — ABNORMAL HIGH (ref 8–23)
CO2: 23 mmol/L (ref 22–32)
CREATININE: 1.28 mg/dL — AB (ref 0.44–1.00)
Calcium: 9.1 mg/dL (ref 8.9–10.3)
Chloride: 106 mmol/L (ref 98–111)
GFR calc Af Amer: 44 mL/min — ABNORMAL LOW (ref >60)
GFR calc non Af Amer: 38 mL/min — ABNORMAL LOW (ref >60)
GLUCOSE: 245 mg/dL — AB (ref 70–99)
Potassium: 4 mmol/L (ref 3.5–5.1)
Sodium: 140 mmol/L (ref 135–145)

## 2017-09-26 LAB — PROTIME-INR
INR: 1.18
PROTHROMBIN TIME: 14.9 s (ref 11.4–15.2)

## 2017-09-26 LAB — TSH: TSH: 0.015 u[IU]/mL — AB (ref 0.350–4.500)

## 2017-09-26 LAB — COMPREHENSIVE METABOLIC PANEL
ALT: 42 U/L (ref 0–44)
ANION GAP: 11 (ref 5–15)
AST: 39 U/L (ref 15–41)
Albumin: 3.5 g/dL (ref 3.5–5.0)
Alkaline Phosphatase: 56 U/L (ref 38–126)
BUN: 24 mg/dL — ABNORMAL HIGH (ref 8–23)
CHLORIDE: 109 mmol/L (ref 98–111)
CO2: 21 mmol/L — ABNORMAL LOW (ref 22–32)
CREATININE: 1.17 mg/dL — AB (ref 0.44–1.00)
Calcium: 9.1 mg/dL (ref 8.9–10.3)
GFR, EST AFRICAN AMERICAN: 49 mL/min — AB (ref >60)
GFR, EST NON AFRICAN AMERICAN: 43 mL/min — AB (ref >60)
Glucose, Bld: 162 mg/dL — ABNORMAL HIGH (ref 70–99)
Potassium: 4.1 mmol/L (ref 3.5–5.1)
Sodium: 141 mmol/L (ref 135–145)
Total Bilirubin: 1 mg/dL (ref 0.3–1.2)
Total Protein: 6.1 g/dL — ABNORMAL LOW (ref 6.5–8.1)

## 2017-09-26 LAB — GLUCOSE, CAPILLARY
Glucose-Capillary: 183 mg/dL — ABNORMAL HIGH (ref 70–99)
Glucose-Capillary: 260 mg/dL — ABNORMAL HIGH (ref 70–99)
Glucose-Capillary: 104 mg/dL — ABNORMAL HIGH (ref 70–99)
Glucose-Capillary: 172 mg/dL — ABNORMAL HIGH (ref 70–99)

## 2017-09-26 LAB — APTT: aPTT: 28 seconds (ref 24–36)

## 2017-09-26 LAB — BRAIN NATRIURETIC PEPTIDE: B Natriuretic Peptide: 349.9 pg/mL — ABNORMAL HIGH (ref 0.0–100.0)

## 2017-09-26 LAB — MAGNESIUM: MAGNESIUM: 1.9 mg/dL (ref 1.7–2.4)

## 2017-09-26 LAB — TROPONIN I
Troponin I: <0.03 ng/mL (ref <0.03)
Troponin I: <0.03 ng/mL (ref <0.03)

## 2017-09-26 MED ORDER — SODIUM CHLORIDE 0.9 % IV SOLN
250.0000 mL | INTRAVENOUS | Status: DC | PRN
Start: 2017-09-26 — End: 2017-09-27

## 2017-09-26 MED ORDER — POTASSIUM CHLORIDE CRYS ER 20 MEQ PO TBCR
20.0000 meq | EXTENDED_RELEASE_TABLET | Freq: Once | ORAL | Status: AC
  Administered 2017-09-26: 20 meq via ORAL
  Filled 2017-09-26: qty 1

## 2017-09-26 MED ORDER — ANASTROZOLE 1 MG PO TABS
1.0000 mg | ORAL_TABLET | Freq: Every day | ORAL | Status: DC
  Administered 2017-09-26 – 2017-09-27 (×2): 1 mg via ORAL
  Filled 2017-09-26 (×2): qty 1

## 2017-09-26 MED ORDER — SODIUM CHLORIDE 0.9% FLUSH
3.0000 mL | Freq: Two times a day (BID) | INTRAVENOUS | Status: DC
  Administered 2017-09-26 (×2): 3 mL via INTRAVENOUS

## 2017-09-26 MED ORDER — ACETAMINOPHEN 325 MG PO TABS
650.0000 mg | ORAL_TABLET | ORAL | Status: DC | PRN
  Administered 2017-09-26: 650 mg via ORAL
  Filled 2017-09-26: qty 2

## 2017-09-26 MED ORDER — GLIMEPIRIDE 1 MG PO TABS
2.0000 mg | ORAL_TABLET | Freq: Every day | ORAL | Status: DC
  Administered 2017-09-26 – 2017-09-27 (×2): 2 mg via ORAL
  Filled 2017-09-26 (×2): qty 2

## 2017-09-26 MED ORDER — SODIUM CHLORIDE 0.9% FLUSH
3.0000 mL | INTRAVENOUS | Status: DC | PRN
Start: 2017-09-26 — End: 2017-09-27

## 2017-09-26 MED ORDER — FUROSEMIDE 10 MG/ML IJ SOLN
60.0000 mg | Freq: Once | INTRAMUSCULAR | Status: AC
  Administered 2017-09-26: 60 mg via INTRAVENOUS
  Filled 2017-09-26: qty 6

## 2017-09-26 MED ORDER — LEVOTHYROXINE SODIUM 100 MCG PO TABS
100.0000 ug | ORAL_TABLET | Freq: Every day | ORAL | Status: DC
  Administered 2017-09-27: 100 ug via ORAL
  Filled 2017-09-26: qty 1

## 2017-09-26 MED ORDER — LEVOTHYROXINE SODIUM 25 MCG PO TABS
125.0000 ug | ORAL_TABLET | Freq: Every day | ORAL | Status: DC
  Administered 2017-09-26: 125 ug via ORAL
  Filled 2017-09-26 (×2): qty 1

## 2017-09-26 MED ORDER — FERROUS SULFATE 325 (65 FE) MG PO TABS
325.0000 mg | ORAL_TABLET | Freq: Every day | ORAL | Status: DC
  Administered 2017-09-26 – 2017-09-27 (×2): 325 mg via ORAL
  Filled 2017-09-26 (×2): qty 1

## 2017-09-26 MED ORDER — AMLODIPINE BESYLATE 5 MG PO TABS
5.0000 mg | ORAL_TABLET | Freq: Every day | ORAL | Status: DC
  Administered 2017-09-26 – 2017-09-27 (×2): 5 mg via ORAL
  Filled 2017-09-26 (×2): qty 1

## 2017-09-26 MED ORDER — INSULIN GLARGINE 100 UNIT/ML ~~LOC~~ SOLN
26.0000 [IU] | Freq: Every day | SUBCUTANEOUS | Status: DC
  Administered 2017-09-26 – 2017-09-27 (×2): 26 [IU] via SUBCUTANEOUS
  Filled 2017-09-26 (×2): qty 0.26

## 2017-09-26 MED ORDER — APIXABAN 5 MG PO TABS
5.0000 mg | ORAL_TABLET | Freq: Two times a day (BID) | ORAL | Status: DC
  Administered 2017-09-26 – 2017-09-27 (×3): 5 mg via ORAL
  Filled 2017-09-26 (×3): qty 1

## 2017-09-26 MED ORDER — ONDANSETRON HCL 4 MG/2ML IJ SOLN: 4.0000 mg | Freq: Four times a day (QID) | INTRAMUSCULAR | Status: DC | PRN

## 2017-09-26 MED ORDER — LOSARTAN POTASSIUM 50 MG PO TABS
50.0000 mg | ORAL_TABLET | Freq: Every day | ORAL | Status: DC
  Administered 2017-09-26 – 2017-09-27 (×2): 50 mg via ORAL
  Filled 2017-09-26 (×2): qty 1

## 2017-09-26 MED ORDER — METFORMIN HCL 850 MG PO TABS
850.0000 mg | ORAL_TABLET | Freq: Every day | ORAL | Status: DC
  Administered 2017-09-26 – 2017-09-27 (×2): 850 mg via ORAL
  Filled 2017-09-26 (×2): qty 1

## 2017-09-26 NOTE — Progress Notes (Signed)
Notified MD of HTN

## 2017-09-26 NOTE — Progress Notes (Signed)
Progress Note  Patient Name: Summer Hawkins Date of Encounter: 09/26/2017  Primary Cardiologist: Lauree Chandler, MD   Subjective   Breathing is some better   No CP    Inpatient Medications    Scheduled Meds: . anastrozole  1 mg Oral Daily  . apixaban  5 mg Oral BID  . ferrous sulfate  325 mg Oral Q breakfast  . glimepiride  2 mg Oral Q breakfast  . insulin aspart  0-15 Units Subcutaneous TID WC  . insulin glargine  26 Units Subcutaneous Daily  . levothyroxine  125 mcg Oral QAC breakfast  . losartan  50 mg Oral Daily  . metFORMIN  850 mg Oral Q breakfast  . sodium chloride flush  3 mL Intravenous Q12H   Continuous Infusions: . sodium chloride     PRN Meds: sodium chloride, acetaminophen, ondansetron (ZOFRAN) IV, sodium chloride flush   Vital Signs    Vitals:   09/25/17 2359 09/26/17 0139 09/26/17 0518 09/26/17 0605  BP: (!) 132/112 (!) 177/61 (!) 166/66 (!) 197/81  Pulse: (!) 46 (!) 40 (!) 46 (!) 30  Resp: 19  18   Temp: (!) 97.5 F (36.4 C)  (!) 97.5 F (36.4 C)   TempSrc: Oral  Oral   SpO2: 100% 100% 100% 100%  Weight:   108.2 kg (238 lb 9.6 oz)     Intake/Output Summary (Last 24 hours) at 09/26/2017 0915 Last data filed at 09/26/2017 0500 Gross per 24 hour  Intake 240 ml  Output 600 ml  Net -360 ml   Filed Weights   09/26/17 0518  Weight: 108.2 kg (238 lb 9.6 oz)    Telemetry    afib 40s    - Personally Reviewed  ECG      Physical Exam   GEN: No acute distress.   Neck: No JVD Cardiac: Irreg irreg  no murmurs, rubs, or gallops.  Respiratory:Mild rales at base GI: Soft, nontender, non-distended  MS: 1+ edema; No deformity. Neuro:  Nonfocal  Psych: Normal affect   Labs    Chemistry Recent Labs  Lab 09/25/17 1513 09/26/17 0030 09/26/17 0534  NA 140 141 140  K 4.0 4.1 4.0  CL 108 109 106  CO2 22 21* 23  GLUCOSE 217* 162* 245*  BUN 24* 24* 26*  CREATININE 1.27* 1.17* 1.28*  CALCIUM 9.2 9.1 9.1  PROT  --  6.1*  --     ALBUMIN  --  3.5  --   AST  --  39  --   ALT  --  42  --   ALKPHOS  --  56  --   BILITOT  --  1.0  --   GFRNONAA 38* 43* 38*  GFRAA 45* 49* 44*  ANIONGAP 10 11 11      Hematology Recent Labs  Lab 09/25/17 1513 09/26/17 0030  WBC 9.8 8.8  RBC 4.19 4.14  HGB 12.6 12.7  HCT 40.1 39.8  MCV 95.7 96.1  MCH 30.1 30.7  MCHC 31.4 31.9  RDW 13.0 13.0  PLT 221 190    Cardiac Enzymes Recent Labs  Lab 09/26/17 0030 09/26/17 0534  TROPONINI <0.03 <0.03   No results for input(s): TROPIPOC in the last 168 hours.   BNP Recent Labs  Lab 09/26/17 0030  BNP 349.9*     DDimer No results for input(s): DDIMER in the last 168 hours.   Radiology    Dg Chest 2 View  Result Date: 09/25/2017 CLINICAL DATA:  Shortness  of breath. EXAM: CHEST - 2 VIEW COMPARISON:  03/04/2011. FINDINGS: Surgical clips are noted over the chest. Cardiomegaly. Mild basilar interstitial prominence. Mild CHF cannot be excluded. No pleural effusion or pneumothorax. Degenerative change thoracic spine. IMPRESSION: Cardiomegaly with mild bilateral interstitial prominence. Mild CHF cannot be excluded. Electronically Signed   By: Marcello [place]  Register   On: 09/25/2017 15:56    Cardiac Studies     Patient Profile     83 y.o. female   Assessment & Plan    1  Bradycardia  HR improving   Off metoprolol  Follow    2  Permanent atrial fib  On anticoag  Off AV nodal blocking agents    3   Acute diastolic CHF  Volume still a little increased even after 1 dose of lasix   Will give 1 more   4  HTN  BP is up   Continue loasartan   Add amlodipine    5  DM    6  Hypothyroidism  TSH is now low consistent with hyperthyroidism    Will cut to 100 mcg per day of synthroid   For questions or updates, please contact [facility] Please consult [email or web address] for contact info under Cardiology/STEMI.      Signed, Dorris Carnes, MD  09/26/2017, 9:15 AM

## 2017-09-27 ENCOUNTER — Other Ambulatory Visit: Payer: Self-pay | Admitting: Physician Assistant

## 2017-09-27 ENCOUNTER — Other Ambulatory Visit: Payer: Self-pay | Admitting: Hematology and Oncology

## 2017-09-27 LAB — GLUCOSE, CAPILLARY
GLUCOSE-CAPILLARY: 192 mg/dL — AB (ref 70–99)
Glucose-Capillary: 155 mg/dL — ABNORMAL HIGH (ref 70–99)

## 2017-09-27 MED ORDER — LEVOTHYROXINE SODIUM 100 MCG PO TABS: 100.0000 ug | ORAL_TABLET | Freq: Every day | ORAL | 1 refills | Status: DC

## 2017-09-27 MED ORDER — AMLODIPINE BESYLATE 5 MG PO TABS: 5.0000 mg | ORAL_TABLET | Freq: Two times a day (BID) | ORAL | 3 refills | Status: DC

## 2017-09-27 NOTE — Progress Notes (Signed)
Progress Note  Patient Name: Summer Hawkins Date of Encounter: 09/27/2017  Primary Cardiologist: Lauree Chandler, MD   Subjective   I feel great   No SOB   No CP    Inpatient Medications    Scheduled Meds: . amLODipine  5 mg Oral Daily  . anastrozole  1 mg Oral Daily  . apixaban  5 mg Oral BID  . ferrous sulfate  325 mg Oral Q breakfast  . glimepiride  2 mg Oral Q breakfast  . insulin aspart  0-15 Units Subcutaneous TID WC  . insulin glargine  26 Units Subcutaneous Daily  . levothyroxine  100 mcg Oral QAC breakfast  . losartan  50 mg Oral Daily  . metFORMIN  850 mg Oral Q breakfast  . sodium chloride flush  3 mL Intravenous Q12H   Continuous Infusions: . sodium chloride     PRN Meds: sodium chloride, acetaminophen, ondansetron (ZOFRAN) IV, sodium chloride flush   Vital Signs    Vitals:   09/26/17 1700 09/26/17 1800 09/26/17 2043 09/27/17 0555  BP:   (!) 143/127 109/73  Pulse: (!) 45 (!) 36 (!) 43 (!) 46  Resp:   (!) 21 16  Temp:   98.1 F (36.7 C) 97.6 F (36.4 C)  TempSrc:   Oral Oral  SpO2: 96% 95% 98% 93%  Weight:    102.9 kg (226 lb 12.8 oz)    Intake/Output Summary (Last 24 hours) at 09/27/2017 0835 Last data filed at 09/26/2017 2200 Gross per 24 hour  Intake 120 ml  Output 1400 ml  Net -1280 ml   Filed Weights   09/26/17 0518 09/27/17 0555  Weight: 108.2 kg (238 lb 9.6 oz) 102.9 kg (226 lb 12.8 oz)    Telemetry    afib 40s to 60s      - Personally Reviewed  ECG      Physical Exam   GEN: No acute distress.   Neck: No JVD Cardiac: Irreg irreg  no murmurs, rubs, or gallops.  Respiratory:Mild rales at base GI: Soft, nontender, non-distended  MS:  No edema; No deformity. Neuro:  Nonfocal  Psych: Normal affect   Labs    Chemistry Recent Labs  Lab 09/25/17 1513 09/26/17 0030 09/26/17 0534  NA 140 141 140  K 4.0 4.1 4.0  CL 108 109 106  CO2 22 21* 23  GLUCOSE 217* 162* 245*  BUN 24* 24* 26*  CREATININE 1.27* 1.17* 1.28*    CALCIUM 9.2 9.1 9.1  PROT  --  6.1*  --   ALBUMIN  --  3.5  --   AST  --  39  --   ALT  --  42  --   ALKPHOS  --  56  --   BILITOT  --  1.0  --   GFRNONAA 38* 43* 38*  GFRAA 45* 49* 44*  ANIONGAP 10 11 11      Hematology Recent Labs  Lab 09/25/17 1513 09/26/17 0030  WBC 9.8 8.8  RBC 4.19 4.14  HGB 12.6 12.7  HCT 40.1 39.8  MCV 95.7 96.1  MCH 30.1 30.7  MCHC 31.4 31.9  RDW 13.0 13.0  PLT 221 190    Cardiac Enzymes Recent Labs  Lab 09/26/17 0030 09/26/17 0534 09/26/17 1107  TROPONINI <0.03 <0.03 <0.03   No results for input(s): TROPIPOC in the last 168 hours.   BNP Recent Labs  Lab 09/26/17 0030  BNP 349.9*     DDimer No results for input(s): DDIMER  in the last 168 hours.   Radiology    Dg Chest 2 View  Result Date: 09/25/2017 CLINICAL DATA:  Shortness of breath. EXAM: CHEST - 2 VIEW COMPARISON:  03/04/2011. FINDINGS: Surgical clips are noted over the chest. Cardiomegaly. Mild basilar interstitial prominence. Mild CHF cannot be excluded. No pleural effusion or pneumothorax. Degenerative change thoracic spine. IMPRESSION: Cardiomegaly with mild bilateral interstitial prominence. Mild CHF cannot be excluded. Electronically Signed   By: Marcello Moores  Register   On: 09/25/2017 15:56    Cardiac Studies     Patient Profile     83 y.o. female   Assessment & Plan    1  Bradycardia  40sto 60s   Afib    Narrow complex  2  Permanent atrial fib  On anticoag  Off AV nodal blocking agents    3   Acute diastolic CHF Volume is good      4  HTN  BP is up   Continue loasartan   Added amlodipine   BP is better   Pt needs to take at home and bring in log   5  DM    6  Hypothyroidism  TSH is now low consistent with hyperthyroidism    Will cut to 100 mcg per day of synthroid  Inform pt of this  OK to d/c   WOuld follow up in cardiology in a few wks     For questions or updates, please contact [place] Please consult [email or web address] for contact info under  Cardiology/STEMI.      Signed, Dorris Carnes, MD  09/27/2017, 8:35 AM

## 2017-09-27 NOTE — Discharge Summary (Signed)
Discharge Summary    Patient ID: Summer Hawkins,  MRN: [id number], DOB/AGE: [birth year] 83 y.o.  Admit date: 09/25/2017 Discharge date: 09/27/2017  Primary Care Provider: Haywood Pao Primary Cardiologist: Lauree Chandler, MD  Discharge Diagnoses    Active Problems:   Symptomatic Bradycardia   Persistent Atrial fibrillation   HTN   DM   CKD stage III   Hypothyroidism   Allergies Allergies  Allergen Reactions  . Bee Venom Nausea And Vomiting    Pt feels faint  . Penicillins Anaphylaxis, Hives and Other (See Comments)    Has patient had a PCN reaction causing immediate rash, facial/tongue/throat swelling, SOB or lightheadedness with hypotension: Yes Has patient had a PCN reaction causing severe rash involving mucus membranes or skin necrosis: No Has patient had a PCN reaction that required hospitalization: No Has patient had a PCN reaction occurring within the last 10 years: No If all of the above answers are "NO", then may proceed with Cephalosporin use.     Diagnostic Studies/Procedures    None this admission    History of Present Illness     Summer Hawkins is a 83 y.o. female with a history of persistent atrial fibrillation s/p remote DCCV in 2012, on anticoagulation with Eliquis for CHADS2VASC score of 5, DM, chronic diastolic CHF, HTN, hypothyroidism and CKD stage 3 presented 7/16 with fatigue who admitted for bradycardia.   She apparently has had some problems with her thyroid recently and her synthroid was increased for a while and then TSH came back low and her synthroid was decreased.    In April 2019, her HR was in the 40's and Toprol was decreased to 25mg  daily.  She was also dx with diastolic CHF as that time and started on Lasix but she says that she could not stand urinating so much so she stopped it and only has been taking it when she gets edema or SOB.  She saw Dr. Angelena Form 07/13/2017 and her HR was well controlled. 2D echo showed normal LVF  with EF 55-60%.  A 48 hour Holter was done to assess adequacy of rate control and showed an average heart rate of 94bpm with no HR below 57bpm.    She was seen back by Estella Husk, PA in the office 08/08/2017 feeling well and weight was down 8lbs.  Her HR was 96bpm so her lopressor was increased to 50mg  daily.  Her BP was also elevated at 140/1mmHg.  She was instructed to follow a low sodium diet.    She was doing well until about 2 weeks ago prior to presentation when she started having fatigue, increased SOB, LE edema and dizziness.  She denies any chest pain and no syncope.  In ER HR was noted to be in the 40's but would dip down into the mid 30's.    Hospital Course     Consultants: None  1.  Symptomatic bradycardia - The patient was admitted and BB was held given her symptoms was likely due to symptomatic bradycardia.  - HR improved with holding BB.  - May need PPM at some point since she likely has tachybrady syndrome  2.  Permanent atrial fibrillation -Continued Eliquis 5mg  BID for CHADS2VASC score of 5. -BB on hold for bradycardia.   3.  Acute diastolic CHF likely secondary to severe bradycardia -cxray with interstitial edema and she has had LE edema on exam - recent 2D echo showed normal LVF - Treated with IV lasix intermittently.  Net I & O negative 1.4L.   4.  HTN -BP poorly controlled. Continued losartan to 50mg  daily (plan was to increase but never did). Due to persistently elevated BP of 170-180s, added amlodipine 5mg  qd which increased to BID dose at discarhge.  Advised to keep log and bring during office visit.   5.  DM type 2 -continue metformin, glimepiride and Toujeo.  6.  Hypothyroidism -TSH is low consistent with hyperthyroidism. Reduced to 100 mcg per day of synthroid. Follow up with PCP in 4 weeks.   Discharge Vitals Blood pressure (!) 180/76, pulse (!) 46, temperature 97.6 F (36.4 C), temperature source Oral, resp. rate 16, weight 226 lb 12.8 oz  (102.9 kg), SpO2 93 %.  Filed Weights   09/26/17 0518 09/27/17 0555  Weight: 238 lb 9.6 oz (108.2 kg) 226 lb 12.8 oz (102.9 kg)    Labs & Radiologic Studies    CBC Recent Labs    09/25/17 1513 09/26/17 0030  WBC 9.8 8.8  NEUTROABS  --  4.7  HGB 12.6 12.7  HCT 40.1 39.8  MCV 95.7 96.1  PLT 221 322   Basic Metabolic Panel Recent Labs    09/26/17 0030 09/26/17 0534  NA 141 140  K 4.1 4.0  CL 109 106  CO2 21* 23  GLUCOSE 162* 245*  BUN 24* 26*  CREATININE 1.17* 1.28*  CALCIUM 9.1 9.1  MG 1.9  --    Liver Function Tests Recent Labs    09/26/17 0030  AST 39  ALT 42  ALKPHOS 56  BILITOT 1.0  PROT 6.1*  ALBUMIN 3.5   Cardiac Enzymes Recent Labs    09/26/17 0030 09/26/17 0534 09/26/17 1107  TROPONINI <0.03 <0.03 <0.03    Hemoglobin A1C Recent Labs    09/26/17 0030  HGBA1C 7.7*   Fasting Lipid Panel Thyroid Function Tests Recent Labs    09/26/17 0030  TSH 0.015*   _____________  Dg Chest 2 View  Result Date: 09/25/2017 CLINICAL DATA:  Shortness of breath. EXAM: CHEST - 2 VIEW COMPARISON:  03/04/2011. FINDINGS: Surgical clips are noted over the chest. Cardiomegaly. Mild basilar interstitial prominence. Mild CHF cannot be excluded. No pleural effusion or pneumothorax. Degenerative change thoracic spine. IMPRESSION: Cardiomegaly with mild bilateral interstitial prominence. Mild CHF cannot be excluded. Electronically Signed   By: Marcello Moores  Register   On: 09/25/2017 15:56   Disposition   Pt is being discharged home today in good condition.  Follow-up Plans & Appointments    Follow-up Information    Tisovec, Fransico Him, MD Follow up in 4 week(s).   Specialty:  Internal Medicine Why:  for TSH check as synthroid reduced to 100 mcg per day during admission   Contact information: [address] [place] [address] [phone]        Richardson Dopp T, PA-C. Go on 10/15/2017.   Specialties:  Cardiology, Physician Assistant Why:  @11 :45am for  hospital follow up Contact information: [address] [address] [phone]          Discharge Instructions    Diet - low sodium heart healthy   Complete by:  As directed    Discharge instructions   Complete by:  As directed    Keep log of blood pressure and bring during next office visit.   Increase activity slowly   Complete by:  As directed       Discharge Medications   Allergies as of 09/27/2017      Reactions  Bee Venom Nausea And Vomiting   Pt feels faint   Penicillins Anaphylaxis, Hives, Other (See Comments)   Has patient had a PCN reaction causing immediate rash, facial/tongue/throat swelling, SOB or lightheadedness with hypotension: Yes Has patient had a PCN reaction causing severe rash involving mucus membranes or skin necrosis: No Has patient had a PCN reaction that required hospitalization: No Has patient had a PCN reaction occurring within the last 10 years: No If all of the above answers are "NO", then may proceed with Cephalosporin use.      Medication List    STOP taking these medications   metoprolol succinate 50 MG 24 hr tablet Commonly known as:  TOPROL-XL     TAKE these medications   amLODipine 5 MG tablet Commonly known as:  NORVASC Take 1 tablet (5 mg total) by mouth 2 (two) times daily.   anastrozole 1 MG tablet Commonly known as:  ARIMIDEX Take 1 tablet (1 mg total) by mouth daily.   apixaban 5 MG Tabs tablet Commonly known as:  ELIQUIS Take 1 tablet (5 mg total) by mouth 2 (two) times daily.   cyanocobalamin 1000 MCG tablet Take 1,000 mcg by mouth daily.   ferrous sulfate 325 (65 FE) MG tablet Take 325 mg by mouth daily with breakfast.   furosemide 20 MG tablet Commonly known as:  LASIX Take 1 tablet (20 mg total) by mouth as needed for fluid or edema.   glimepiride 2 MG tablet Commonly known as:  AMARYL Take 2 mg by mouth daily with breakfast.   levothyroxine 100 MCG tablet Commonly known  as:  SYNTHROID, LEVOTHROID Take 1 tablet (100 mcg total) by mouth daily before breakfast. Start taking on:  09/28/2017 What changed:    medication strength  how much to take  when to take this   losartan 50 MG tablet Commonly known as:  COZAAR Take 50 mg by mouth daily.   metFORMIN 850 MG tablet Commonly known as:  GLUCOPHAGE Take 850 mg by mouth daily with breakfast.   multivitamin with minerals tablet Take 1 tablet by mouth daily.   TOUJEO SOLOSTAR 300 UNIT/ML Sopn Generic drug:  Insulin Glargine Inject 26 Units into the skin daily.        Acute coronary syndrome (MI, NSTEMI, STEMI, etc) this admission?: No.    Outstanding Labs/Studies   TSH in 4 weeks with PCP  Duration of Discharge Encounter   Greater than 30 minutes including physician time.  Jarrett Soho, PA 09/27/2017, 1:44 PM

## 2017-09-27 NOTE — Telephone Encounter (Signed)
Pt's pharmacy is requesting a refill on levothyroxine. Would Dr. Angelena Form like to refill this medication? Please address

## 2017-09-27 NOTE — Consult Note (Signed)
            [facility] CM Primary Care Navigator  09/27/2017  [place] 1934-07-16 [id number]   Wenttosee patient at the bedside to identify possible discharge needs but she was alreadydischargedhome per staff.  Per chart review,patient presented with fatigue and was admitted for symptomatic bradycardia  Primary care provider's office is listed as providing transition of care (TOC) follow-up.   For additional questions please contact:  Edwena Felty A. Adelin Ventrella, BSN, RN-BC [facility] PRIMARY CARE Navigator Cell: [phone]

## 2017-09-27 NOTE — Discharge Instructions (Signed)
Bradycardia, Adult °Bradycardia is a slower-than-normal heartbeat. A normal resting heart rate for an adult ranges from 60 to 100 beats per minute. With bradycardia, the resting heart rate is less than 60 beats per minute. °Bradycardia can prevent enough oxygen from reaching certain areas of your body when you are active. It can be serious if it keeps enough oxygen from reaching your brain and other parts of your body. Bradycardia is not a problem for everyone. For some healthy adults, a slow resting heart rate is normal. °What are the causes? °This condition may be caused by: °· A problem with the heart, including: °? A problem with the heart's electrical system, such as a heart block. °? A problem with the heart's natural pacemaker (sinus node). °? Heart disease. °? A heart attack. °? Heart damage. °? A heart infection. °? A heart condition that is present at birth (congenital heart defect). °· Certain medicines that treat heart conditions. °· Certain conditions, such as hypothyroidism and obstructive sleep apnea. °· Problems with the balance of chemicals and other substances, like potassium, in the blood. ° °What increases the risk? °This condition is more likely to develop in adults who: °· Are age 65 or older. °· Have high blood pressure (hypertension), high cholesterol (hyperlipidemia), or diabetes. °· Drink heavily, use tobacco or nicotine products, or use drugs. °· Are stressed. ° °What are the signs or symptoms? °Symptoms of this condition include: °· Light-headedness. °· Feeling faint or fainting. °· Fatigue and weakness. °· Shortness of breath. °· Chest pain (angina). °· Drowsiness. °· Confusion. °· Dizziness. ° °How is this diagnosed? °This condition may be diagnosed based on: °· Your symptoms. °· Your medical history. °· A physical exam. ° °During the exam, your health care provider will listen to your heartbeat and check your pulse. To confirm the diagnosis, your health care provider may order tests,  such as: °· Blood tests. °· An electrocardiogram (ECG). This test records the heart's electrical activity. The test can show how fast your heart is beating and whether the heartbeat is steady. °· A test in which you wear a portable device (event recorder or Holter monitor) to record your heart's electrical activity while you go about your day. °· An exercise test. ° °How is this treated? °Treatment for this condition depends on the cause of the condition and how severe your symptoms are. Treatment may involve: °· Treatment of the underlying condition. °· Changing your medicines or how much medicine you take. °· Having a small, battery-operated device called a pacemaker implanted under the skin. When bradycardia occurs, this device can be used to increase your heart rate and help your heart to beat in a regular rhythm. ° °Follow these instructions at home: °Lifestyle ° °· Manage any health conditions that contribute to bradycardia as told by your health care provider. °· Follow a heart-healthy diet. A nutrition specialist (dietitian) can help to educate you about healthy food options and changes. °· Follow an exercise program that is approved by your health care provider. °· Maintain a healthy weight. °· Try to reduce or manage your stress, such as with yoga or meditation. If you need help reducing stress, ask your health care provider. °· Do not use use any products that contain nicotine or tobacco, such as cigarettes and e-cigarettes. If you need help quitting, ask your health care provider. °· Do not use illegal drugs. °· Limit alcohol intake to no more than 1 drink per day for nonpregnant women and 2 drinks per   day for men. One drink equals 12 oz of beer, 5 oz of wine, or 1 oz of hard liquor. General instructions  Take over-the-counter and prescription medicines only as told by your health care provider.  Keep all follow-up visits as directed by your health care provider. This is important. How is this  prevented? In some cases, bradycardia may be prevented by:  Treating underlying medical problems.  Stopping behaviors or medicines that can trigger the condition.  Contact a health care provider if:  You feel light-headed or dizzy.  You almost faint.  You feel weak or are easily fatigued during physical activity.  You experience confusion or have memory problems. Get help right away if:  You faint.  You have an irregular heartbeat (palpitations).  You have chest pain.  You have trouble breathing. This information is not intended to replace advice given to you by your health care provider. Make sure you discuss any questions you have with your health care provider. Document Released: 11/19/2001 Document Revised: 10/26/2015 Document Reviewed: 08/19/2015 Elsevier Interactive Patient Education  2017 [place] Heart-healthy meal planning includes:  Limiting unhealthy fats.  Increasing healthy fats.  Making other small dietary changes.  You may need to talk with your doctor or a diet specialist (dietitian) to create an eating plan that is right for you. What types of fat should I choose?  Choose healthy fats. These include olive oil and canola oil, flaxseeds, walnuts, almonds, and seeds.  Eat more omega-3 fats. These include salmon, mackerel, sardines, tuna, flaxseed oil, and ground flaxseeds. Try to eat fish at least twice each week.  Limit saturated fats. ? Saturated fats are often found in animal products, such as meats, butter, and cream. ? Plant sources of saturated fats include palm oil, palm kernel oil, and coconut oil.  Avoid foods with partially hydrogenated oils in them. These include stick margarine, some tub margarines, cookies, crackers, and other baked goods. These contain trans fats. What general guidelines do I need to follow?  Check food labels carefully. Identify foods with trans fats or high amounts of saturated  fat.  Fill one half of your plate with vegetables and green salads. Eat 4-5 servings of vegetables per day. A serving of vegetables is: ? 1 cup of raw leafy vegetables. ?  cup of raw or cooked cut-up vegetables. ?  cup of vegetable juice.  Fill one fourth of your plate with whole grains. Look for the word "whole" as the first word in the ingredient list.  Fill one fourth of your plate with lean protein foods.  Eat 4-5 servings of fruit per day. A serving of fruit is: ? One medium whole fruit. ?  cup of dried fruit. ?  cup of fresh, frozen, or canned fruit. ?  cup of 100% fruit juice.  Eat more foods that contain soluble fiber. These include apples, broccoli, carrots, beans, peas, and barley. Try to get 20-30 g of fiber per day.  Eat more home-cooked food. Eat less restaurant, buffet, and fast food.  Limit or avoid alcohol.  Limit foods high in starch and sugar.  Avoid fried foods.  Avoid frying your food. Try baking, boiling, grilling, or broiling it instead. You can also reduce fat by: ? Removing the skin from poultry. ? Removing all visible fats from meats. ? Skimming the fat off of stews, soups, and gravies before serving them. ? Steaming vegetables in water or broth.  Lose weight if you are overweight.  Eat 4-5 servings of nuts, legumes, and seeds per week: ? One serving of dried beans or legumes equals  cup after being cooked. ? One serving of nuts equals 1 ounces. ? One serving of seeds equals  ounce or one tablespoon.  You may need to keep track of how much salt or sodium you eat. This is especially true if you have high blood pressure. Talk with your doctor or dietitian to get more information. What foods can I eat? Grains Breads, including [place], white, pita, wheat, raisin, rye, oatmeal, and [place]. Tortillas that are neither fried nor made with lard or trans fat. Low-fat rolls, including hotdog and hamburger buns and English muffins. Biscuits. Muffins.  Waffles. Pancakes. Light popcorn. Whole-grain cereals. Flatbread. Melba toast. Pretzels. Breadsticks. Rusks. Low-fat snacks. Low-fat crackers, including oyster, saltine, matzo, graham, animal, and rye. Rice and pasta, including brown rice and pastas that are made with whole wheat. Vegetables All vegetables. Fruits All fruits, but limit coconut. Meats and Other Protein Sources Lean, well-trimmed beef, veal, pork, and lamb. Chicken and [place] without skin. All fish and shellfish. Wild duck, rabbit, pheasant, and venison. Egg whites or low-cholesterol egg substitutes. Dried beans, peas, lentils, and tofu. Seeds and most nuts. Dairy Low-fat or nonfat cheeses, including ricotta, string, and mozzarella. Skim or 1% milk that is liquid, powdered, or evaporated. Buttermilk that is made with low-fat milk. Nonfat or low-fat yogurt. Beverages Mineral water. Diet carbonated beverages. Sweets and Desserts Sherbets and fruit ices. Honey, jam, marmalade, jelly, and syrups. Meringues and gelatins. Pure sugar candy, such as hard candy, jelly beans, gumdrops, mints, marshmallows, and small amounts of dark chocolate. [employer] Eat all sweets and desserts in moderation. Fats and Oils Nonhydrogenated (trans-free) margarines. Vegetable oils, including soybean, sesame, sunflower, olive, peanut, safflower, corn, canola, and cottonseed. Salad dressings or mayonnaise made with a vegetable oil. Limit added fats and oils that you use for cooking, baking, salads, and as spreads. Other Cocoa powder. Coffee and tea. All seasonings and condiments. The items listed above may not be a complete list of recommended foods or beverages. Contact your dietitian for more options. What foods are not recommended? Grains Breads that are made with saturated or trans fats, oils, or whole milk. Croissants. Butter rolls. Cheese breads. Sweet rolls. Donuts. Buttered popcorn. Chow mein noodles. High-fat crackers, such as cheese or butter  crackers. Meats and Other Protein Sources Fatty meats, such as hotdogs, short ribs, sausage, spareribs, bacon, rib eye roast or steak, and mutton. High-fat deli meats, such as salami and bologna. Caviar. Domestic duck and goose. Organ meats, such as kidney, liver, sweetbreads, and heart. Dairy Cream, sour cream, cream cheese, and creamed cottage cheese. Whole-milk cheeses, including blue (bleu), Monte[place] Jack, [place], [place], American, [place], Swiss, cheddar, [place], and [place]. Whole or 2% milk that is liquid, evaporated, or condensed. Whole buttermilk. Cream sauce or high-fat cheese sauce. Yogurt that is made from whole milk. Beverages Regular sodas and juice drinks with added sugar. Sweets and Desserts Frosting. Pudding. Cookies. Cakes other than angel food cake. Candy that has milk chocolate or white chocolate, hydrogenated fat, butter, coconut, or unknown ingredients. Buttered syrups. Full-fat ice cream or ice cream drinks. Fats and Oils Gravy that has suet, meat fat, or shortening. Cocoa butter, hydrogenated oils, palm oil, coconut oil, palm kernel oil. These can often be found in baked products, candy, fried foods, nondairy creamers, and whipped toppings. Solid fats and shortenings, including bacon fat, salt pork, lard, and butter. Nondairy cream substitutes, such  as coffee creamers and sour cream substitutes. Salad dressings that are made of unknown oils, cheese, or sour cream. The items listed above may not be a complete list of foods and beverages to avoid. Contact your dietitian for more information. This information is not intended to replace advice given to you by your health care provider. Make sure you discuss any questions you have with your health care provider. Document Released: 08/29/2011 Document Revised: 08/05/2015 Document Reviewed: 08/21/2013 Elsevier Interactive Patient Education  [employer].

## 2017-10-02 ENCOUNTER — Encounter: Payer: Self-pay | Admitting: *Deleted

## 2017-10-02 NOTE — Congregational Nurse Program (Signed)
[id number]/[name] /doing well still some weakness.  BP still running somewhat high and glucose is higher than usual but number for both are improving.  Offered assistance to her for bp monitoring states that she is checking with an automated cuff and reporting levels back to the md.  No further needs found.

## 2017-10-03 NOTE — Telephone Encounter (Signed)
Prescription was sent to pharmacy on 7/18 upon discharge from hospital.  Further refills should be from primary care.

## 2017-10-12 DIAGNOSIS — E038 Other specified hypothyroidism: Secondary | ICD-10-CM | POA: Diagnosis not present

## 2017-10-12 DIAGNOSIS — E7800 Pure hypercholesterolemia, unspecified: Secondary | ICD-10-CM | POA: Diagnosis not present

## 2017-10-12 DIAGNOSIS — E1129 Type 2 diabetes mellitus with other diabetic kidney complication: Secondary | ICD-10-CM | POA: Diagnosis not present

## 2017-10-12 DIAGNOSIS — Z6838 Body mass index (BMI) 38.0-38.9, adult: Secondary | ICD-10-CM | POA: Diagnosis not present

## 2017-10-12 DIAGNOSIS — I10 Essential (primary) hypertension: Secondary | ICD-10-CM | POA: Diagnosis not present

## 2017-10-12 DIAGNOSIS — I498 Other specified cardiac arrhythmias: Secondary | ICD-10-CM | POA: Diagnosis not present

## 2017-10-12 DIAGNOSIS — E11319 Type 2 diabetes mellitus with unspecified diabetic retinopathy without macular edema: Secondary | ICD-10-CM | POA: Diagnosis not present

## 2017-10-12 DIAGNOSIS — N183 Chronic kidney disease, stage 3 (moderate): Secondary | ICD-10-CM | POA: Diagnosis not present

## 2017-10-12 DIAGNOSIS — R0609 Other forms of dyspnea: Secondary | ICD-10-CM | POA: Diagnosis not present

## 2017-10-12 DIAGNOSIS — I129 Hypertensive chronic kidney disease with stage 1 through stage 4 chronic kidney disease, or unspecified chronic kidney disease: Secondary | ICD-10-CM | POA: Diagnosis not present

## 2017-10-12 DIAGNOSIS — I509 Heart failure, unspecified: Secondary | ICD-10-CM | POA: Diagnosis not present

## 2017-10-15 ENCOUNTER — Encounter: Payer: Self-pay | Admitting: Physician Assistant

## 2017-10-15 ENCOUNTER — Ambulatory Visit (INDEPENDENT_AMBULATORY_CARE_PROVIDER_SITE_OTHER): Payer: Medicare Other | Admitting: Physician Assistant

## 2017-10-15 VITALS — BP 130/72 | HR 76 | Ht 64.0 in | Wt 231.0 lb

## 2017-10-15 DIAGNOSIS — R001 Bradycardia, unspecified: Secondary | ICD-10-CM

## 2017-10-15 DIAGNOSIS — I5032 Chronic diastolic (congestive) heart failure: Secondary | ICD-10-CM | POA: Diagnosis not present

## 2017-10-15 DIAGNOSIS — I482 Chronic atrial fibrillation: Secondary | ICD-10-CM | POA: Diagnosis not present

## 2017-10-15 DIAGNOSIS — I10 Essential (primary) hypertension: Secondary | ICD-10-CM | POA: Diagnosis not present

## 2017-10-15 DIAGNOSIS — M7989 Other specified soft tissue disorders: Secondary | ICD-10-CM

## 2017-10-15 DIAGNOSIS — I4821 Permanent atrial fibrillation: Secondary | ICD-10-CM

## 2017-10-15 NOTE — Patient Instructions (Addendum)
Medication Instructions:  Your physician recommends that you continue on your current medications as directed. Please refer to the Current Medication list given to you today.   Labwork: None ordered  Testing/Procedures: None ordered  Follow-Up:  Your physician wants you to follow-up in: 3 months with Dr. Angelena Form.   Any Other Special Instructions Will Be Listed Below (If Applicable).   If you need a refill on your cardiac medications before your next appointment, please call your pharmacy.

## 2017-10-15 NOTE — Progress Notes (Signed)
Cardiology Office Note:    Date:  10/15/2017   ID:  FARHEEN PFAHLER, DOB Aug 01, 1934, MRN [id number]  PCP:  Haywood Pao, MD  Cardiologist:  Lauree Chandler, MD    Referring MD: Haywood Pao, MD   Chief Complaint  Patient presents with  . Hospitalization Follow-up    bradycardia, CHF    History of Present Illness:    Summer Hawkins is a 83 y.o. female with permanent atrial fibrillation, diastolic heart failure, diabetes, hypertension, hyperlipidemia, iron deficiency.  She was seen in 07/2017 for symptomatic bradycardia.  Her beta-blocker dose was reduced.  A 48 Hour Holter did not demonstrate significant bradycardia.  She was admitted 7/16-7/18 with symptomatic bradycardia and decompensated heart failure. Her HR was in the 40s.  Her beta-blocker was discontinued with improved heart rate.  She responded to IV Lasix.  Amlodipine was added due to uncontrolled hypertension.  Ms. Moro returns for post hospital follow up.  She is here alone.  Since DC from the hospital, she has been feeling better. Her breathing is improved. She denies chest pain, syncope, paroxysmal nocturnal dyspnea.  She does note lower extremity swelling.  It is somewhat better when she awakens in the AM and is worse throughout the day.  She saw her PCP last week and had follow up lab work.    Prior CV studies:   The following studies were reviewed today:  48-hour Holter 08/01/2017 Atrial fibrillation during monitoring period. No heart rate below 57 bpm.  PVCs (1072 during 48 hour monitoring period)  Echo 07/26/2017 Mild LVH, EF 55-60, normal wall motion, mild to moderate MAC, mild LAE, PASP 36  Nuclear stress test 01/06/2011 Low risk, small area of apical reversibility suggesting artifact, EF 52  Past Medical History:  Diagnosis Date  . Atrial fibrillation ([place])   . Breast cancer ([place])   . Breast cancer, left ([place])   . Chronic kidney disease    stage III - patient was unaware  . Diabetes  mellitus   . Dyspnea   . Dysrhythmia    Afib  . H/O: hysterectomy   . History of colonoscopy 01/25/2010  . History of mammogram 08/04/2009  . Hyperlipidemia   . Hypertension   . Hypothyroidism   . Ketoacidosis, diabetic, no coma, non-insulin dependent    Type II  . Vitamin B12 deficiency    Surgical Hx: The patient  has a past surgical history that includes Bilateral foot surgery; Abdominal hysterectomy; Tubal ligation; Rotator cuff repair (Left); Laparoscopic cholecystectomy; Cardioversion (02/27/2011); Breast lumpectomy with radioactive seed localization (Left, 01/19/2016); Breast lumpectomy with radioactive seed and sentinel lymph node biopsy (Left, 01/19/2016); Breast lumpectomy (Left); and Breast excisional biopsy.   Current Medications: Current Meds  Medication Sig  . amLODipine (NORVASC) 5 MG tablet Take 1 tablet (5 mg total) by mouth 2 (two) times daily.  Marland Kitchen anastrozole (ARIMIDEX) 1 MG tablet TAKE 1 TABLET(1 MG) BY MOUTH DAILY  . apixaban (ELIQUIS) 5 MG TABS tablet Take 1 tablet (5 mg total) by mouth 2 (two) times daily.  . cyanocobalamin 1000 MCG tablet Take 1,000 mcg by mouth daily.   . ferrous sulfate 325 (65 FE) MG tablet Take 325 mg by mouth daily with breakfast.    . furosemide (LASIX) 20 MG tablet Take 1 tablet (20 mg total) by mouth as needed for fluid or edema.  Marland Kitchen glimepiride (AMARYL) 2 MG tablet Take 2 mg by mouth daily with breakfast.   . levothyroxine (SYNTHROID, LEVOTHROID) 100 MCG tablet  Take 1 tablet (100 mcg total) by mouth daily before breakfast.  . losartan (COZAAR) 50 MG tablet Take 50 mg by mouth daily.  . metFORMIN (GLUCOPHAGE) 850 MG tablet Take 850 mg by mouth daily with breakfast.   . Multiple Vitamins-Minerals (MULTIVITAMIN WITH MINERALS) tablet Take 1 tablet by mouth daily.    Nelva Nay SOLOSTAR 300 UNIT/ML SOPN Inject 30 Units into the skin daily.      Allergies:   Bee venom and Penicillins   Social History   Tobacco Use  . Smoking status: Never  Smoker  . Smokeless tobacco: Never Used  Substance Use Topics  . Alcohol use: No    Alcohol/week: 0.0 oz  . Drug use: No     Family Hx: The patient's family history includes Breast cancer in her sister; Diabetes (age of onset: 56) in her mother; Heart failure (age of onset: 10) in her father; Pneumonia (age of onset: 66) in her mother. There is no history of Cancer.  ROS:   Please see the history of present illness.    Review of Systems  Constitution: Positive for chills.  Cardiovascular: Positive for irregular heartbeat and leg swelling.   All other systems reviewed and are negative.   EKGs/Labs/Other Test Reviewed:    EKG:  EKG is  ordered today.  The ekg ordered today demonstrates atrial fibrillation, HR 97, septal Q waves, QTc 447  Recent Labs: 09/26/2017: ALT 42; B Natriuretic Peptide 349.9; BUN 26; Creatinine, Ser 1.28; Hemoglobin 12.7; Magnesium 1.9; Platelets 190; Potassium 4.0; Sodium 140; TSH 0.015   Recent Lipid Panel No results found for: CHOL, TRIG, HDL, CHOLHDL, LDLCALC, LDLDIRECT  Physical Exam:    VS:  BP 130/72   Pulse 76   Ht 5' 4"  (1.626 m)   Wt 231 lb (104.8 kg)   LMP  (LMP Unknown)   SpO2 97%   BMI 39.65 kg/m     Wt Readings from Last 3 Encounters:  10/15/17 231 lb (104.8 kg)  09/27/17 226 lb 12.8 oz (102.9 kg)  08/08/17 223 lb 3.2 oz (101.2 kg)     Physical Exam  Constitutional: She is oriented to person, place, and time. She appears well-developed and well-nourished. No distress.  HENT:  Head: Normocephalic and atraumatic.  Eyes: No scleral icterus.  Neck: No JVD present.  Cardiovascular: Normal rate. An irregularly irregular rhythm present.  No murmur heard. Pulmonary/Chest: Effort normal. She has no wheezes. She has no rales.  Abdominal: Soft. She exhibits no distension.  Musculoskeletal: She exhibits edema (1+ bilat LE edema).  Neurological: She is alert and oriented to person, place, and time.  Skin: Skin is warm and dry.    Psychiatric: She has a normal mood and affect.    ASSESSMENT & PLAN:    Permanent atrial fibrillation (HCC) HR is now in the 90s.  She is no longer having symptoms.  Her HR seems controlled for now.  If her rates increase, she will likely need referral to EP to consider pacemaker implantation.  She is tolerating anticoagulation with Eliquis.  Continue current regimen.  I will request recent labs from her PCP.  Bradycardia As noted, her heart rate is improved.  Continue observation.  If she shows more signs of tachybradycardia syndrome, refer to EP for pacemaker implantation.  Chronic diastolic CHF (congestive heart failure) (HCC) Volume appears stable.  Continue current therapy.  Essential hypertension The patient's blood pressure is controlled on her current regimen.  Continue current therapy.    Leg swelling  I suspect her leg swelling is due to the Amlodipine.  We discussed adjusting her dose of amlodipine to 5 mg QD and increasing Losartan to 100 mg QD.  However, for now, I have recommended she wear compression stockings and keep her legs elevated.  If her swelling is bothersome, we can adjust her medications.     Dispo:  Return in about 3 months (around 01/15/2018) for Routine Follow Up w/ Dr. Angelena Form.   Medication Adjustments/Labs and Tests Ordered: Current medicines are reviewed at length with the patient today.  Concerns regarding medicines are outlined above.  Tests Ordered: Orders Placed This Encounter  Procedures  . EKG 12-Lead   Medication Changes: No orders of the defined types were placed in this encounter.   Signed, Richardson Dopp, PA-C  10/15/2017 1:50 PM    [facility] [place], [place], [place]  [address] Phone: [phone]; Fax: [phone]

## 2017-11-13 ENCOUNTER — Telehealth: Payer: Self-pay | Admitting: Hematology and Oncology

## 2017-11-13 DIAGNOSIS — H353132 Nonexudative age-related macular degeneration, bilateral, intermediate dry stage: Secondary | ICD-10-CM | POA: Diagnosis not present

## 2017-11-13 DIAGNOSIS — H43813 Vitreous degeneration, bilateral: Secondary | ICD-10-CM | POA: Diagnosis not present

## 2017-11-13 DIAGNOSIS — E113293 Type 2 diabetes mellitus with mild nonproliferative diabetic retinopathy without macular edema, bilateral: Secondary | ICD-10-CM | POA: Diagnosis not present

## 2017-11-13 NOTE — Telephone Encounter (Signed)
[facility] CALL DAY - moved 9/10 f/u to AM. Left message for patient. Schedule mailed.

## 2017-11-20 ENCOUNTER — Telehealth: Payer: Self-pay | Admitting: Hematology and Oncology

## 2017-11-20 ENCOUNTER — Other Ambulatory Visit: Payer: Self-pay | Admitting: Internal Medicine

## 2017-11-20 ENCOUNTER — Inpatient Hospital Stay: Payer: Medicare Other | Attending: Hematology and Oncology | Admitting: Hematology and Oncology

## 2017-11-20 ENCOUNTER — Other Ambulatory Visit: Payer: Self-pay | Admitting: Hematology and Oncology

## 2017-11-20 DIAGNOSIS — Z170 Estrogen receptor positive status [ER+]: Secondary | ICD-10-CM | POA: Diagnosis not present

## 2017-11-20 DIAGNOSIS — Z79811 Long term (current) use of aromatase inhibitors: Secondary | ICD-10-CM

## 2017-11-20 DIAGNOSIS — Z853 Personal history of malignant neoplasm of breast: Secondary | ICD-10-CM

## 2017-11-20 DIAGNOSIS — N951 Menopausal and female climacteric states: Secondary | ICD-10-CM | POA: Diagnosis not present

## 2017-11-20 DIAGNOSIS — C50412 Malignant neoplasm of upper-outer quadrant of left female breast: Secondary | ICD-10-CM | POA: Diagnosis not present

## 2017-11-20 MED ORDER — ANASTROZOLE 1 MG PO TABS: ORAL_TABLET | ORAL | 3 refills | Status: DC

## 2017-11-20 NOTE — Assessment & Plan Note (Signed)
12/10/2015: Left breast biopsy: Grade 1-2 IDC with calcifications with DCIS, ER 100%, PR 90%, Ki-67 5%, HER-2 negative ratio 1.45; left breast mass 1.9 x 1 x 1.1 cm; T1 CN 0 stage IA clinical stage 01/19/2016 Left lumpectomy: IDC with calcifications, 1.8 cms, grade 2, DCIS with calcifications, 0/1 lymph node negative, T1 CN 0 stage IA Oncotype DX score 8: Risk of recurrence 6% Patient refusedradiation therapy  Current treatment:No role of systemic chemotherapy adjuvant antiestrogen therapy with anastrozole 1 mg by mouth daily 5 years,startedFebruary 2018.  Anastrozoletoxicities: 1. Intermittent hot flashes Denies any arthralgias or myalgias  Surveillance: 1. Breast exam 11/20/2017: Benign 2. Mammogram 12/12/2016: Breast density category B, no evidence of breast cancer.  Hospitalization for symptomatic bradycardia and atrial fibrillation 09/25/2017 to 09/27/2017 Return to clinic in one year for follow-up

## 2017-11-20 NOTE — Telephone Encounter (Signed)
Gave patient avs and calendar.   °

## 2017-11-20 NOTE — Progress Notes (Signed)
Patient Care Team: Tisovec, Fransico Him, MD as PCP - General (Internal Medicine) Burnell Blanks, MD as PCP - Cardiology (Cardiology) Jovita Kussmaul, MD as Consulting Physician (General Surgery) Nicholas Lose, MD as Consulting Physician (Hematology and Oncology) Delice Bison Charlestine Massed, NP as Nurse Practitioner (Hematology and Oncology)  DIAGNOSIS:  Encounter Diagnosis  Name Primary?  . Malignant neoplasm of upper-outer quadrant of left breast in female, estrogen receptor positive ([place])     SUMMARY OF ONCOLOGIC HISTORY:   Breast cancer of upper-outer quadrant of left female breast ([place])   12/15/2015 Initial Diagnosis    Left breast biopsy: Grade 1-2 IDC with calcifications with DCIS, ER 100%, PR 90%, Ki-67 5%, HER-2 negative ratio 1.45; left breast mass 1.9 x 1 x 1.1 cm; T1 CN 0 stage IA clinical stage    01/19/2016 Surgery    Left lumpectomy: IDC with calcifications, 1.8 cm, grade 2, DCIS with calcifications, 0/1 lymph node negative, T1 CN 0 stage IA    01/26/2016 Oncotype testing    Oncotype DX score 8: risk of recurrence 6%     Radiation Therapy    Refused radiation therapy    04/13/2016 -  Anti-estrogen oral therapy    Anastrozole 1 mg daily     CHIEF COMPLIANT: Follow-up on anastrozole therapy  INTERVAL HISTORY: RAVON MCILHENNY is a 83 year old with above-mentioned history of left breast cancer.  She underwent lumpectomy and refused radiation therapy and is currently on anastrozole therapy.  She is tolerating it extremely well.  She does have occasional hot flashes but they are not bothering her.  Denies any arthralgias or myalgias.  Denies any lumps or nodules in the breast.  REVIEW OF SYSTEMS:   Constitutional: Denies fevers, chills or abnormal weight loss Eyes: Denies blurriness of vision Ears, nose, mouth, throat, and face: Denies mucositis or sore throat Respiratory: Denies cough, dyspnea or wheezes Cardiovascular: Denies palpitation, chest  discomfort Gastrointestinal:  Denies nausea, heartburn or change in bowel habits Skin: Denies abnormal skin rashes Lymphatics: Denies new lymphadenopathy or easy bruising Neurological:Denies numbness, tingling or new weaknesses Behavioral/Psych: Mood is stable, no new changes  Extremities: No lower extremity edema  All other systems were reviewed with the patient and are negative.  I have reviewed the past medical history, past surgical history, social history and family history with the patient and they are unchanged from previous note.  ALLERGIES:  is allergic to bee venom and penicillins.  MEDICATIONS:  Current Outpatient Medications  Medication Sig Dispense Refill  . amLODipine (NORVASC) 5 MG tablet Take 1 tablet (5 mg total) by mouth 2 (two) times daily. 60 tablet 3  . anastrozole (ARIMIDEX) 1 MG tablet TAKE 1 TABLET(1 MG) BY MOUTH DAILY 90 tablet 0  . apixaban (ELIQUIS) 5 MG TABS tablet Take 1 tablet (5 mg total) by mouth 2 (two) times daily. 60 tablet   . cyanocobalamin 1000 MCG tablet Take 1,000 mcg by mouth daily.     . ferrous sulfate 325 (65 FE) MG tablet Take 325 mg by mouth daily with breakfast.      . furosemide (LASIX) 20 MG tablet Take 1 tablet (20 mg total) by mouth as needed for fluid or edema. 30 tablet 9  . glimepiride (AMARYL) 2 MG tablet Take 2 mg by mouth daily with breakfast.     . levothyroxine (SYNTHROID, LEVOTHROID) 100 MCG tablet Take 1 tablet (100 mcg total) by mouth daily before breakfast. 30 tablet 1  . losartan (COZAAR) 50 MG tablet Take  50 mg by mouth daily.    . metFORMIN (GLUCOPHAGE) 850 MG tablet Take 850 mg by mouth daily with breakfast.     . Multiple Vitamins-Minerals (MULTIVITAMIN WITH MINERALS) tablet Take 1 tablet by mouth daily.      Nelva Nay SOLOSTAR 300 UNIT/ML SOPN Inject 30 Units into the skin daily.   1   No current facility-administered medications for this visit.     PHYSICAL EXAMINATION: ECOG PERFORMANCE STATUS: 1 - Symptomatic but  completely ambulatory  Vitals:   11/20/17 1138  BP: 140/85  Pulse: (!) 106  Resp: 18  Temp: 98.9 F (37.2 C)  SpO2: 96%   Filed Weights   11/20/17 1138  Weight: 224 lb 15.5 oz (102 kg)    GENERAL:alert, no distress and comfortable SKIN: skin color, texture, turgor are normal, no rashes or significant lesions EYES: normal, Conjunctiva are pink and non-injected, sclera clear OROPHARYNX:no exudate, no erythema and lips, buccal mucosa, and tongue normal  NECK: supple, thyroid normal size, non-tender, without nodularity LYMPH:  no palpable lymphadenopathy in the cervical, axillary or inguinal LUNGS: clear to auscultation and percussion with normal breathing effort HEART: regular rate & rhythm and no murmurs and no lower extremity edema ABDOMEN:abdomen soft, non-tender and normal bowel sounds MUSCULOSKELETAL:no cyanosis of digits and no clubbing  NEURO: alert & oriented x 3 with fluent speech, no focal motor/sensory deficits EXTREMITIES: No lower extremity edema   LABORATORY DATA:  I have reviewed the data as listed CMP Latest Ref Rng & Units 09/26/2017 09/26/2017 09/25/2017  Glucose 70 - 99 mg/dL 245(H) 162(H) 217(H)  BUN 8 - 23 mg/dL 26(H) 24(H) 24(H)  Creatinine 0.44 - 1.00 mg/dL 1.28(H) 1.17(H) 1.27(H)  Sodium 135 - 145 mmol/L 140 141 140  Potassium 3.5 - 5.1 mmol/L 4.0 4.1 4.0  Chloride 98 - 111 mmol/L 106 109 108  CO2 22 - 32 mmol/L 23 21(L) 22  Calcium 8.9 - 10.3 mg/dL 9.1 9.1 9.2  Total Protein 6.5 - 8.1 g/dL - 6.1(L) -  Total Bilirubin 0.3 - 1.2 mg/dL - 1.0 -  Alkaline Phos 38 - 126 U/L - 56 -  AST 15 - 41 U/L - 39 -  ALT 0 - 44 U/L - 42 -    Lab Results  Component Value Date   WBC 8.8 09/26/2017   HGB 12.7 09/26/2017   HCT 39.8 09/26/2017   MCV 96.1 09/26/2017   PLT 190 09/26/2017   NEUTROABS 4.7 09/26/2017    ASSESSMENT & PLAN:  Breast cancer of upper-outer quadrant of left female breast ([place]) 12/10/2015: Left breast biopsy: Grade 1-2 IDC with  calcifications with DCIS, ER 100%, PR 90%, Ki-67 5%, HER-2 negative ratio 1.45; left breast mass 1.9 x 1 x 1.1 cm; T1 CN 0 stage IA clinical stage 01/19/2016 Left lumpectomy: IDC with calcifications, 1.8 cms, grade 2, DCIS with calcifications, 0/1 lymph node negative, T1 CN 0 stage IA Oncotype DX score 8: Risk of recurrence 6% Patient refusedradiation therapy  Current treatment:No role of systemic chemotherapy adjuvant antiestrogen therapy with anastrozole 1 mg by mouth daily 5 years,startedFebruary 2018.  Anastrozoletoxicities: 1. Intermittent hot flashes Denies any arthralgias or myalgias  Surveillance: 1. Breast exam 11/20/2017: Benign 2. Mammogram  12/12/2016: Breast density category B, no evidence of breast cancer.  Hospitalization for symptomatic bradycardia and atrial fibrillation 09/25/2017 to 09/27/2017 Return to clinic in one year for follow-up    No orders of the defined types were placed in this encounter.  The patient has a good  understanding of the overall plan. she agrees with it. she will call with any problems that may develop before the next visit here.   Harriette Ohara, MD 11/20/17

## 2017-12-12 ENCOUNTER — Other Ambulatory Visit: Payer: Self-pay | Admitting: Hematology and Oncology

## 2017-12-12 ENCOUNTER — Other Ambulatory Visit: Payer: Self-pay

## 2017-12-12 DIAGNOSIS — Z853 Personal history of malignant neoplasm of breast: Secondary | ICD-10-CM

## 2017-12-14 ENCOUNTER — Ambulatory Visit
Admission: RE | Admit: 2017-12-14 | Discharge: 2017-12-14 | Disposition: A | Discharge status: Home / Self Care | Payer: Medicare Other | Source: Ambulatory Visit | Attending: Hematology and Oncology | Admitting: Hematology and Oncology

## 2017-12-14 DIAGNOSIS — R928 Other abnormal and inconclusive findings on diagnostic imaging of breast: Secondary | ICD-10-CM | POA: Diagnosis not present

## 2017-12-14 DIAGNOSIS — Z853 Personal history of malignant neoplasm of breast: Secondary | ICD-10-CM

## 2017-12-26 ENCOUNTER — Ambulatory Visit (INDEPENDENT_AMBULATORY_CARE_PROVIDER_SITE_OTHER): Payer: Medicare Other

## 2017-12-26 ENCOUNTER — Telehealth: Payer: Self-pay | Admitting: Cardiovascular Disease

## 2017-12-26 ENCOUNTER — Encounter: Payer: Self-pay | Admitting: Cardiovascular Disease

## 2017-12-26 ENCOUNTER — Ambulatory Visit (INDEPENDENT_AMBULATORY_CARE_PROVIDER_SITE_OTHER): Payer: Medicare Other | Admitting: Cardiovascular Disease

## 2017-12-26 VITALS — BP 112/60 | HR 50 | Ht 64.0 in | Wt 203.8 lb

## 2017-12-26 DIAGNOSIS — I482 Chronic atrial fibrillation, unspecified: Secondary | ICD-10-CM

## 2017-12-26 DIAGNOSIS — I10 Essential (primary) hypertension: Secondary | ICD-10-CM

## 2017-12-26 DIAGNOSIS — R001 Bradycardia, unspecified: Secondary | ICD-10-CM

## 2017-12-26 DIAGNOSIS — I5033 Acute on chronic diastolic (congestive) heart failure: Secondary | ICD-10-CM

## 2017-12-26 NOTE — Progress Notes (Signed)
Chief Complaint  Patient presents with  . Follow-up    PAF   History of Present Illness: 83 yo female with with history of persistent atrial fibrillation, DM, hypothyroidism, HTN and hyperlipidemia today for cardiac followup. I saw her in October 2012 as a new patient. She presented for her stress test as ordered by Dr. Osborne Casco and was found to be in atrial fibrillation. She had no awareness of palpitations. No stress induced ischemia on nuclear study. Low risk study. Normal LVEF. I started Toprol XL and Pradaxa. Echo on 01/12/11 with normal LV size and function, LVEF of 55-60%, mild LVH, moderate TR, mild MR. She was cardioverted on 02/27/11. She has been on Eliquis. She was seen in primary care in May 2019 and  dyspnea and weakness. HR was in the 40s on 06/22/17 with atrial fib. Her Toprol dose was reduced to 25 mg daily. She was felt to be volume overloaded and was started on Lasix 20 mg daily. I saw her in our office 07/13/17 and her heart was improved. Her symptoms had resolved. 48 hour cardiac monitor May 2019 showed atrial fib with lowest heart rate 57 bpm, PVCs. Echo May 2019 with LvEF=55-60%, no wall motion abnormalities. No significant valve disease. She admitted to [facility] July 16-18, 2019 with symptomatic bradycardia and decompensated CHF. Her heart rate was in the 40s. Her beta blocker was stopped and her heart rate improved. She was diuresed with IV Lasix. Norvasc added due to uncontrolled HTN.   She is here today as an add on to our schedule. She called in this am and reported weakness and fatigue. Her heart rate had been in the 60-100 range for the past two months but three days ago she began to feel weak and noted her heart rate was in the 40s. She woke up today and went to breakfast with her son and has felt weak all day. The patient denies any chest pain, dyspnea, palpitations, lower extremity edema, orthopnea, PND, near syncope or syncope.   Primary Care Physician: Haywood Pao,  MD  Past Medical History:  Diagnosis Date  . Atrial fibrillation ([place])   . Breast cancer ([place])   . Breast cancer, left ([place])   . Chronic kidney disease    stage III - patient was unaware  . Diabetes mellitus   . Dyspnea   . Dysrhythmia    Afib  . H/O: hysterectomy   . History of colonoscopy 01/25/2010  . History of mammogram 08/04/2009  . Hyperlipidemia   . Hypertension   . Hypothyroidism   . Ketoacidosis, diabetic, no coma, non-insulin dependent    Type II  . Vitamin B12 deficiency     Past Surgical History:  Procedure Laterality Date  . ABDOMINAL HYSTERECTOMY    . Bilateral foot surgery    . BREAST EXCISIONAL BIOPSY    . BREAST LUMPECTOMY Left    2017  . BREAST LUMPECTOMY WITH RADIOACTIVE SEED AND SENTINEL LYMPH NODE BIOPSY Left 01/19/2016   Procedure: LEFT BREAST LUMPECTOMY WITH RADIOACTIVE SEED AND SENTINEL LYMPH NODE BIOPSY;  Surgeon: Autumn Messing III, MD;  Location: [place] [facility];  Service: General;  Laterality: Left;  . BREAST LUMPECTOMY WITH RADIOACTIVE SEED LOCALIZATION Left 01/19/2016  . CARDIOVERSION  02/27/2011   Procedure: CARDIOVERSION;  Surgeon: Loralie Champagne, MD;  Location: [place];  Service: Cardiovascular;  Laterality: N/A;  . LAPAROSCOPIC CHOLECYSTECTOMY    . ROTATOR CUFF REPAIR Left   . TUBAL LIGATION      Current Outpatient Medications  Medication Sig Dispense Refill  . amLODipine (NORVASC) 5 MG tablet Take 1 tablet (5 mg total) by mouth 2 (two) times daily. 60 tablet 3  . anastrozole (ARIMIDEX) 1 MG tablet TAKE 1 TABLET(1 MG) BY MOUTH DAILY 90 tablet 3  . apixaban (ELIQUIS) 5 MG TABS tablet Take 1 tablet (5 mg total) by mouth 2 (two) times daily. 60 tablet   . cyanocobalamin 1000 MCG tablet Take 1,000 mcg by mouth daily.     . ferrous sulfate 325 (65 FE) MG tablet Take 325 mg by mouth daily with breakfast.      . furosemide (LASIX) 20 MG tablet Take 1 tablet (20 mg total) by mouth as needed for fluid or edema. 30 tablet 9  . glimepiride (AMARYL) 2 MG tablet  Take 2 mg by mouth daily with breakfast.     . levothyroxine (SYNTHROID, LEVOTHROID) 137 MCG tablet Take 1 tablet by mouth daily.  2  . losartan (COZAAR) 50 MG tablet Take 50 mg by mouth daily.    . metFORMIN (GLUCOPHAGE) 850 MG tablet Take 850 mg by mouth daily with breakfast.     . Multiple Vitamins-Minerals (MULTIVITAMIN WITH MINERALS) tablet Take 1 tablet by mouth daily.      Nelva Nay SOLOSTAR 300 UNIT/ML SOPN Inject 30 Units into the skin daily.   1   No current facility-administered medications for this visit.     Allergies  Allergen Reactions  . Bee Venom Nausea And Vomiting    Pt feels faint  . Penicillins Anaphylaxis, Hives and Other (See Comments)    Has patient had a PCN reaction causing immediate rash, facial/tongue/throat swelling, SOB or lightheadedness with hypotension: Yes Has patient had a PCN reaction causing severe rash involving mucus membranes or skin necrosis: No Has patient had a PCN reaction that required hospitalization: No Has patient had a PCN reaction occurring within the last 10 years: No If all of the above answers are "NO", then may proceed with Cephalosporin use.     Social History   Socioeconomic History  . Marital status: Married    Spouse name: Not on file  . Number of children: 2  . Years of education: Not on file  . Highest education level: Not on file  Occupational History    Employer: OTHER    Comment: Worked at [employer]  . Financial resource strain: Not on file  . Food insecurity:    Worry: Not on file    Inability: Not on file  . Transportation needs:    Medical: Not on file    Non-medical: Not on file  Tobacco Use  . Smoking status: Never Smoker  . Smokeless tobacco: Never Used  Substance and Sexual Activity  . Alcohol use: No    Alcohol/week: 0.0 standard drinks  . Drug use: No  . Sexual activity: Not on file  Lifestyle  . Physical activity:    Days per week: Not on file    Minutes per session: Not on file  .  Stress: Not on file  Relationships  . Social connections:    Talks on phone: Not on file    Gets together: Not on file    Attends religious service: Not on file    Active member of club or organization: Not on file    Attends meetings of clubs or organizations: Not on file    Relationship status: Not on file  . Intimate partner violence:    Fear of current or ex  partner: Not on file    Emotionally abused: Not on file    Physically abused: Not on file    Forced sexual activity: Not on file  Other Topics Concern  . Not on file  Social History Narrative   Patient since 29   Husband with prostate cancer   10-siblings-no cancer    Family History  Problem Relation Age of Onset  . Heart failure Father 36       enlarged heart  . Pneumonia Mother 77  . Diabetes Mother 83  . Breast cancer Sister   . Cancer Neg Hx     Review of Systems:  As stated in the HPI and otherwise negative.   BP 112/60   Pulse (!) 50   Ht 5\' 4"  (1.626 m)   Wt 203 lb 12.8 oz (92.4 kg)   LMP  (LMP Unknown)   SpO2 97%   BMI 34.98 kg/m   Physical Examination:  General: Well developed, well nourished, NAD  HEENT: OP clear, mucus membranes moist  SKIN: warm, dry. No rashes. Neuro: No focal deficits  Musculoskeletal: Muscle strength 5/5 all ext  Psychiatric: Mood and affect normal  Neck: No JVD, no carotid bruits, no thyromegaly, no lymphadenopathy.  Lungs:Clear bilaterally, no wheezes, rhonci, crackles Cardiovascular: Irregular, brady. No murmurs, gallops or rubs. Abdomen:Soft. Bowel sounds present. Non-tender.  Extremities: No lower extremity edema. Pulses are 2 + in the bilateral DP/PT.  Echo May 2019: - Left ventricle: The cavity size was normal. Wall thickness was   increased in a pattern of mild LVH. Systolic function was normal.   The estimated ejection fraction was in the range of 55% to 60%.   Wall motion was normal; there were no regional wall motion   abnormalities. - Mitral valve:  Mildly to moderately calcified annulus. Mildly   thickened leaflets . - Left atrium: The atrium was mildly dilated. - Pulmonary arteries: Systolic pressure was mildly increased. PA   peak pressure: 36 mm Hg (S).  EKG:  EKG is ordered today. The ekg ordered today demonstrates Atrial fibrillation, rate 50 bpm.   Recent Labs: 09/26/2017: ALT 42; B Natriuretic Peptide 349.9; BUN 26; Creatinine, Ser 1.28; Hemoglobin 12.7; Magnesium 1.9; Platelets 190; Potassium 4.0; Sodium 140; TSH 0.015   Lipid Panel No results found for: CHOL, TRIG, HDL, CHOLHDL, VLDL, LDLCALC, LDLDIRECT   Wt Readings from Last 3 Encounters:  12/26/17 203 lb 12.8 oz (92.4 kg)  11/20/17 224 lb 15.5 oz (102 kg)  10/15/17 231 lb (104.8 kg)     Other studies Reviewed: Additional studies/ records that were reviewed today include: . Review of the above records demonstrates:    Assessment and Plan:   1. Atrial fibrillation, permanent: She is in atrial fibrillation today. Her heart rate is 50 bpm. She has a log of her heart rates over the past 2 months and her rates had been over 60 bpm until the last three days. She has been feeling poorly for the past three days with no energy. No near syncope or syncope. Her Toprol was stopped in July 2019. This was stopped in the setting of an admission for symptomatic bradycardia. I will arrange a 30 day event monitor. Check TSH and BMET today. Will refer to EP to discuss a pacemaker.   If she has any events prior to her EP appt, would need to admit her to discuss pacemaker placement.   2. HTN: BP is controlled. No changes  3. Mitral valve insufficiency: Trivial by echo  May 2019  4. Acute on chronic diastolic CHF: Weight is stable. No volume overload on exam. Continue Lasix 20 mg daily. Use extra Lasix as needed.   5. Hyperthyroidism: Her TSH was very low in July 2019 while admitted to [facility]. She says her synthroid was increased but the d/c summary says it was decreased. She has been  taking 137 mcg of Synthroid daily. Will check TSH today.   Current medicines are reviewed at length with the patient today.  The patient does not have concerns regarding medicines.  The following changes have been made:  no change  Labs/ tests ordered today include:   Orders Placed This Encounter  Procedures  . Basic Metabolic Panel (BMET)  . TSH  . Cardiac event monitor  . EKG 12-Lead    Disposition:   FU with me in 3  months  Signed, Lauree Chandler, MD 12/26/2017 3:43 PM    [facility] [place], [place], [place]  [address] Phone: [phone]; Fax: [phone]

## 2017-12-26 NOTE — Telephone Encounter (Signed)
Let's add her on to see me today. Gerald Stabs

## 2017-12-26 NOTE — Patient Instructions (Signed)
Medication Instructions:  Your physician recommends that you continue on your current medications as directed. Please refer to the Current Medication list given to you today.  If you need a refill on your cardiac medications before your next appointment, please call your pharmacy.   Lab work: Lab work to be done today--BMP and TSH If you have labs (blood work) drawn today and your tests are completely normal, you will receive your results only by: Marland Kitchen MyChart Message (if you have MyChart) OR . A paper copy in the mail If you have any lab test that is abnormal or we need to change your treatment, we will call you to review the results.  Testing/Procedures:  Your physician has recommended that you wear an event monitor. Event monitors are medical devices that record the heart's electrical activity. Doctors most often [place] these monitors to diagnose arrhythmias. Arrhythmias are problems with the speed or rhythm of the heartbeat. The monitor is a small, portable device. You can wear one while you do your normal daily activities. This is usually used to diagnose what is causing palpitations/syncope (passing out).    Follow-Up: You have been referred to Electrophysiology.  Please schedule a new patient appointment for next 1-2 weeks.       Your physician recommends that you schedule a follow-up appointment in: 3-4 months with Dr. Angelena Form

## 2017-12-26 NOTE — Telephone Encounter (Signed)
New message:      STAT if HR is under 50 or over 120 (normal HR is 60-100 beats per minute)  1) What is your heart rate? 44 (am)  2) Do you have a log of your heart rate readings (document readings)? 102/110/56/53  3) Do you have any other symptoms? Fatigue    *Did not send to triage due to the pt stating she was heading out

## 2017-12-26 NOTE — Telephone Encounter (Signed)
I spoke with pt and scheduled her to see Dr. Angelena Form at 3:00 today.  I advised her to go to ED if she feels dizzy, short of breath or has any problems prior to appointment.

## 2017-12-26 NOTE — Telephone Encounter (Signed)
I spoke with pt.  She is not home at present time. Currently out eating breakfast with her son.  Her heart rate this morning was 44. Reports the following readings 10/15-53 10/14-58 10/13-98 10/12-110 10/11-102 Prior to this was 102-110 She checks with BP cuff. She felt shaky this morning but feels better now that she has eaten breakfast. Does feel fatigued. This is new this morning. No energy. When she went to the ED this summer she was feeling short of breath. No shortness of breath at this time. Does not feel like she will pass out. Has not rechecked heart rate today.  Will review with Dr. Angelena Form

## 2017-12-27 ENCOUNTER — Telehealth: Payer: Self-pay | Admitting: *Deleted

## 2017-12-27 DIAGNOSIS — I5033 Acute on chronic diastolic (congestive) heart failure: Secondary | ICD-10-CM

## 2017-12-27 LAB — BASIC METABOLIC PANEL
BUN/Creatinine Ratio: 21 (ref 12–28)
BUN: 38 mg/dL — ABNORMAL HIGH (ref 8–27)
CHLORIDE: 102 mmol/L (ref 96–106)
CO2: 18 mmol/L — AB (ref 20–29)
Calcium: 9.7 mg/dL (ref 8.7–10.3)
Creatinine, Ser: 1.85 mg/dL — ABNORMAL HIGH (ref 0.57–1.00)
GFR calc Af Amer: 29 mL/min/{1.73_m2} — ABNORMAL LOW (ref >59)
GFR, EST NON AFRICAN AMERICAN: 25 mL/min/{1.73_m2} — AB (ref >59)
GLUCOSE: 150 mg/dL — AB (ref 65–99)
POTASSIUM: 4.6 mmol/L (ref 3.5–5.2)
SODIUM: 141 mmol/L (ref 134–144)

## 2017-12-27 LAB — TSH: TSH: 0.055 u[IU]/mL — AB (ref 0.450–4.500)

## 2017-12-27 MED ORDER — LEVOTHYROXINE SODIUM 100 MCG PO TABS: 100.0000 ug | ORAL_TABLET | Freq: Every day | ORAL | 2 refills | Status: DC

## 2017-12-27 NOTE — Telephone Encounter (Signed)
-----   Message from Burnell Blanks, MD sent at 12/27/2017  9:33 AM EDT ----- TSH is still low. She will need to lower her Synthroid to 100 mcg daily. Creatinine is up. She should not take any Lasix for the next week. Push po hydration. Recheck BMET in 7-10 days. cdm

## 2017-12-27 NOTE — Telephone Encounter (Signed)
Pt notified. Will send prescription for synthroid to [facility] at [facility] and [facility]. Pt will come in for BMP on 01/02/18

## 2018-01-02 ENCOUNTER — Other Ambulatory Visit: Payer: Medicare Other | Admitting: *Deleted

## 2018-01-02 ENCOUNTER — Telehealth: Payer: Self-pay | Admitting: *Deleted

## 2018-01-02 DIAGNOSIS — I5033 Acute on chronic diastolic (congestive) heart failure: Secondary | ICD-10-CM

## 2018-01-02 LAB — BASIC METABOLIC PANEL
BUN/Creatinine Ratio: 12 (ref 12–28)
BUN: 16 mg/dL (ref 8–27)
CALCIUM: 9.9 mg/dL (ref 8.7–10.3)
CO2: 21 mmol/L (ref 20–29)
CREATININE: 1.36 mg/dL — AB (ref 0.57–1.00)
Chloride: 105 mmol/L (ref 96–106)
GFR, EST AFRICAN AMERICAN: 42 mL/min/{1.73_m2} — AB (ref >59)
GFR, EST NON AFRICAN AMERICAN: 37 mL/min/{1.73_m2} — AB (ref >59)
Glucose: 52 mg/dL — ABNORMAL LOW (ref 65–99)
POTASSIUM: 5.1 mmol/L (ref 3.5–5.2)
SODIUM: 141 mmol/L (ref 134–144)

## 2018-01-02 MED ORDER — FUROSEMIDE 20 MG PO TABS: ORAL_TABLET | ORAL | 3 refills | Status: DC

## 2018-01-02 NOTE — Telephone Encounter (Signed)
-----   Message from Burnell Blanks, MD sent at 01/02/2018  3:49 PM EDT ----- Creatinine is improved. Not back to baseline but close. She should follow daily weights and take take lasix 20 mg if her weight goes up or she notices edema. Thanks, chris

## 2018-01-02 NOTE — Telephone Encounter (Signed)
I spoke with pt and reviewed lab results and recommendations from Dr. Angelena Form with her.

## 2018-01-07 ENCOUNTER — Institutional Professional Consult (permissible substitution): Payer: Medicare Other | Admitting: Internal Medicine

## 2018-01-17 ENCOUNTER — Ambulatory Visit (INDEPENDENT_AMBULATORY_CARE_PROVIDER_SITE_OTHER): Payer: Medicare Other | Admitting: Cardiology

## 2018-01-17 ENCOUNTER — Encounter: Payer: Self-pay | Admitting: Cardiology

## 2018-01-17 VITALS — BP 126/62 | HR 63 | Ht 64.0 in | Wt 221.0 lb

## 2018-01-17 DIAGNOSIS — I5032 Chronic diastolic (congestive) heart failure: Secondary | ICD-10-CM

## 2018-01-17 DIAGNOSIS — I4821 Permanent atrial fibrillation: Secondary | ICD-10-CM | POA: Diagnosis not present

## 2018-01-17 DIAGNOSIS — I10 Essential (primary) hypertension: Secondary | ICD-10-CM

## 2018-01-17 NOTE — Progress Notes (Signed)
Electrophysiology Office Note   Date:  01/17/2018   ID:  Summer Hawkins, DOB [birth year], MRN [id number]  PCP:  Haywood Pao, MD  Cardiologist: Summer Hawkins Primary Electrophysiologist:  Summer Babson Meredith Leeds, MD    No chief complaint on file.    History of Present Illness: Summer Hawkins is a 83 y.o. female who is being seen today for the evaluation of persistent atrial fibrillation at the request of Summer Hawkins*. Presenting today for electrophysiology evaluation.  She has a history of persistent atrial fibrillation, diabetes, hypothyroidism, hypertension, and hyperlipidemia.  She currently takes Eliquis for anticoagulation.  She was found to have dyspnea and weakness May 2019.  Her heart rate was in the 40s and atrial fibrillation.  Her metoprolol dose was decreased to 25 mg a day.  She was started on Lasix as well due to some mild volume overload.  She subsequently wore a 48-hour monitor that showed heart rate dips into the 50s but no further changes.  She was admitted to [facility] July 2019 with symptomatic bradycardia and decompensated heart failure.  Her heart rate was in the 40s.  Her beta-blocker was stopped and her heart rate improved.  She tolerated diuresis with Lasix.  She was seen again in cardiology clinic who noted that her heart rate was in the 40s.    Today, she denies symptoms of palpitations, chest pain, shortness of breath, orthopnea, PND, lower extremity edema, claudication, dizziness, presyncope, syncope, bleeding, or neurologic sequela. The patient is tolerating medications without difficulties.  She is currently feeling well.  She has no chest pain or shortness of breath.  She has had no weakness or fatigue.  She is able to do all of her daily activities without restriction.  She is currently wearing a cardiac monitor.  Her heart rates are in the 40s while sleeping, but generally in the 60s to 80s while awake.  She does have short periods where her  heart rate gets above 100.   Past Medical History:  Diagnosis Date  . Atrial fibrillation ([place])   . Breast cancer ([place])   . Breast cancer, left ([place])   . Chronic kidney disease    stage III - patient was unaware  . Diabetes mellitus   . Dyspnea   . Dysrhythmia    Afib  . H/O: hysterectomy   . History of colonoscopy 01/25/2010  . History of mammogram 08/04/2009  . Hyperlipidemia   . Hypertension   . Hypothyroidism   . Ketoacidosis, diabetic, no coma, non-insulin dependent    Type II  . Vitamin B12 deficiency    Past Surgical History:  Procedure Laterality Date  . ABDOMINAL HYSTERECTOMY    . Bilateral foot surgery    . BREAST EXCISIONAL BIOPSY    . BREAST LUMPECTOMY Left    2017  . BREAST LUMPECTOMY WITH RADIOACTIVE SEED AND SENTINEL LYMPH NODE BIOPSY Left 01/19/2016   Procedure: LEFT BREAST LUMPECTOMY WITH RADIOACTIVE SEED AND SENTINEL LYMPH NODE BIOPSY;  Surgeon: Autumn Messing III, MD;  Location: [place];  Service: General;  Laterality: Left;  . BREAST LUMPECTOMY WITH RADIOACTIVE SEED LOCALIZATION Left 01/19/2016  . CARDIOVERSION  02/27/2011   Procedure: CARDIOVERSION;  Surgeon: Loralie Champagne, MD;  Location: [place];  Service: Cardiovascular;  Laterality: N/A;  . LAPAROSCOPIC CHOLECYSTECTOMY    . ROTATOR CUFF REPAIR Left   . TUBAL LIGATION       Current Outpatient Medications  Medication Sig Dispense Refill  . amLODipine (NORVASC) 5 MG  tablet Take 1 tablet (5 mg total) by mouth 2 (two) times daily. 60 tablet 3  . anastrozole (ARIMIDEX) 1 MG tablet TAKE 1 TABLET(1 MG) BY MOUTH DAILY 90 tablet 3  . apixaban (ELIQUIS) 5 MG TABS tablet Take 1 tablet (5 mg total) by mouth 2 (two) times daily. 60 tablet   . cyanocobalamin 1000 MCG tablet Take 1,000 mcg by mouth daily.     . ferrous sulfate 325 (65 FE) MG tablet Take 325 mg by mouth daily with breakfast.      . furosemide (LASIX) 20 MG tablet Take one tablet by mouth daily as needed for swelling or weight gain 20 tablet 3  .  glimepiride (AMARYL) 2 MG tablet Take 2 mg by mouth daily with breakfast.     . levothyroxine (SYNTHROID) 100 MCG tablet Take 1 tablet (100 mcg total) by mouth daily before breakfast. 30 tablet 2  . losartan (COZAAR) 50 MG tablet Take 50 mg by mouth daily.    . metFORMIN (GLUCOPHAGE) 850 MG tablet Take 850 mg by mouth daily with breakfast.     . Multiple Vitamins-Minerals (MULTIVITAMIN WITH MINERALS) tablet Take 1 tablet by mouth daily.      Nelva Nay SOLOSTAR 300 UNIT/ML SOPN Inject 30 Units into the skin daily.   1   No current facility-administered medications for this visit.     Allergies:   Bee venom and Penicillins   Social History:  The patient  reports that she has never smoked. She has never used smokeless tobacco. She reports that she does not drink alcohol or use drugs.   Family History:  The patient's family history includes Breast cancer in her sister; Diabetes (age of onset: 80) in her mother; Heart failure (age of onset: 41) in her father; Pneumonia (age of onset: 53) in her mother.    ROS:  Please see the history of present illness.   Otherwise, review of systems is positive for potation's, rash.   All other systems are reviewed and negative.    PHYSICAL EXAM: VS:  BP 126/62   Pulse 63   Ht 5\' 4"  (1.626 m)   Wt 221 lb (100.2 kg)   LMP  (LMP Unknown)   SpO2 97%   BMI 37.93 kg/m  , BMI Body mass index is 37.93 kg/m. GEN: Well nourished, well developed, in no acute distress  HEENT: normal  Neck: no JVD, carotid bruits, or masses Cardiac: iRRR; no murmurs, rubs, or gallops,no edema  Respiratory:  clear to auscultation bilaterally, normal work of breathing GI: soft, nontender, nondistended, + BS MS: no deformity or atrophy  Skin: warm and dry Neuro:  Strength and sensation are intact Psych: euthymic mood, full affect  EKG:  EKG is not ordered today. Personal review of the ekg ordered 12/26/17 shows AF with slow response, septal Q waves  Recent Labs: 09/26/2017:  ALT 42; B Natriuretic Peptide 349.9; Hemoglobin 12.7; Magnesium 1.9; Platelets 190 12/26/2017: TSH 0.055 01/02/2018: BUN 16; Creatinine, Ser 1.36; Potassium 5.1; Sodium 141    Lipid Panel  No results found for: CHOL, TRIG, HDL, CHOLHDL, VLDL, LDLCALC, LDLDIRECT   Wt Readings from Last 3 Encounters:  01/17/18 221 lb (100.2 kg)  12/26/17 203 lb 12.8 oz (92.4 kg)  11/20/17 224 lb 15.5 oz (102 kg)      Other studies Reviewed: Additional studies/ records that were reviewed today include: TTE 07/26/17  Review of the above records today demonstrates:  - Left ventricle: The cavity size was normal. Wall  thickness was   increased in a pattern of mild LVH. Systolic function was normal.   The estimated ejection fraction was in the range of 55% to 60%.   Wall motion was normal; there were no regional wall motion   abnormalities. - Mitral valve: Mildly to moderately calcified annulus. Mildly   thickened leaflets . - Left atrium: The atrium was mildly dilated. - Pulmonary arteries: Systolic pressure was mildly increased. PA   peak pressure: 36 mm Hg (S).   ASSESSMENT AND PLAN:  1.  Permanent atrial fibrillation: Heart rates have been slow down into the 40s to 50s with significant lack of energy.  She is currently not on rate controlling medications.  She is currently wearing a cardiac monitor that shows heart rates that range from the 40s while sleeping up into the 120s.  She has had no episodes of weakness and fatigue while wearing the monitor.  I do not see any evidence of high-grade heart block on her monitor.  At this point, we Marietta Sikkema avoid pacemaker implant as she is feeling well.  If she does have further episodes of weakness and fatigue that are associated with bradycardia, pacemaker implant may be warranted.  2.  Hypertension: Well-controlled.  No changes.  3.  Chronic diastolic heart failure: No acute signs of volume overload noted.    Current medicines are reviewed at length with the  patient today.   The patient does not have concerns regarding her medicines.  The following changes were made today:  none  Labs/ tests ordered today include:  No orders of the defined types were placed in this encounter.  Case discussed with primary cardiology  Disposition:   FU with Natanya Holecek PRN  Signed, Delton Stelle Meredith Leeds, MD  01/17/2018 10:26 AM     [facility] [address] [place] [address] [phone] (office) [phone] (fax)

## 2018-01-17 NOTE — Patient Instructions (Signed)
Medication Instructions:  Your physician recommends that you continue on your current medications as directed. Please refer to the Current Medication list given to you today.  If you need a refill on your cardiac medications before your next appointment, please call your pharmacy.   Lab work: None ordered  Testing/Procedures: None ordered  Follow-Up: No follow up is needed at this time with Dr. Curt Bears.  He will see you on an as needed basis.  Thank you for choosing [facility]!!

## 2018-01-21 ENCOUNTER — Ambulatory Visit: Payer: Medicare Other | Admitting: Cardiovascular Disease

## 2018-01-24 DIAGNOSIS — R001 Bradycardia, unspecified: Secondary | ICD-10-CM | POA: Diagnosis not present

## 2018-01-24 DIAGNOSIS — I482 Chronic atrial fibrillation, unspecified: Secondary | ICD-10-CM

## 2018-01-25 DIAGNOSIS — R001 Bradycardia, unspecified: Secondary | ICD-10-CM | POA: Diagnosis not present

## 2018-03-06 ENCOUNTER — Other Ambulatory Visit (HOSPITAL_COMMUNITY): Payer: Self-pay | Admitting: Physician Assistant

## 2018-03-19 DIAGNOSIS — E538 Deficiency of other specified B group vitamins: Secondary | ICD-10-CM | POA: Diagnosis not present

## 2018-03-19 DIAGNOSIS — E1129 Type 2 diabetes mellitus with other diabetic kidney complication: Secondary | ICD-10-CM | POA: Diagnosis not present

## 2018-03-19 DIAGNOSIS — E038 Other specified hypothyroidism: Secondary | ICD-10-CM | POA: Diagnosis not present

## 2018-03-19 DIAGNOSIS — Z0000 Encounter for general adult medical examination without abnormal findings: Secondary | ICD-10-CM | POA: Diagnosis not present

## 2018-03-19 DIAGNOSIS — R82998 Other abnormal findings in urine: Secondary | ICD-10-CM | POA: Diagnosis not present

## 2018-03-19 DIAGNOSIS — E039 Hypothyroidism, unspecified: Secondary | ICD-10-CM | POA: Diagnosis not present

## 2018-03-19 DIAGNOSIS — E7800 Pure hypercholesterolemia, unspecified: Secondary | ICD-10-CM | POA: Diagnosis not present

## 2018-03-26 DIAGNOSIS — Z23 Encounter for immunization: Secondary | ICD-10-CM | POA: Diagnosis not present

## 2018-03-26 DIAGNOSIS — I509 Heart failure, unspecified: Secondary | ICD-10-CM | POA: Diagnosis not present

## 2018-03-26 DIAGNOSIS — Z6837 Body mass index (BMI) 37.0-37.9, adult: Secondary | ICD-10-CM | POA: Diagnosis not present

## 2018-03-26 DIAGNOSIS — E538 Deficiency of other specified B group vitamins: Secondary | ICD-10-CM | POA: Diagnosis not present

## 2018-03-26 DIAGNOSIS — R808 Other proteinuria: Secondary | ICD-10-CM | POA: Diagnosis not present

## 2018-03-26 DIAGNOSIS — E1129 Type 2 diabetes mellitus with other diabetic kidney complication: Secondary | ICD-10-CM | POA: Diagnosis not present

## 2018-03-26 DIAGNOSIS — Z0000 Encounter for general adult medical examination without abnormal findings: Secondary | ICD-10-CM | POA: Diagnosis not present

## 2018-03-26 DIAGNOSIS — I129 Hypertensive chronic kidney disease with stage 1 through stage 4 chronic kidney disease, or unspecified chronic kidney disease: Secondary | ICD-10-CM | POA: Diagnosis not present

## 2018-03-26 DIAGNOSIS — E11319 Type 2 diabetes mellitus with unspecified diabetic retinopathy without macular edema: Secondary | ICD-10-CM | POA: Diagnosis not present

## 2018-03-26 DIAGNOSIS — Z7901 Long term (current) use of anticoagulants: Secondary | ICD-10-CM | POA: Diagnosis not present

## 2018-03-26 DIAGNOSIS — I498 Other specified cardiac arrhythmias: Secondary | ICD-10-CM | POA: Diagnosis not present

## 2018-03-26 DIAGNOSIS — C50412 Malignant neoplasm of upper-outer quadrant of left female breast: Secondary | ICD-10-CM | POA: Diagnosis not present

## 2018-03-26 DIAGNOSIS — N183 Chronic kidney disease, stage 3 (moderate): Secondary | ICD-10-CM | POA: Diagnosis not present

## 2018-04-03 DIAGNOSIS — L648 Other androgenic alopecia: Secondary | ICD-10-CM | POA: Diagnosis not present

## 2018-04-03 DIAGNOSIS — L821 Other seborrheic keratosis: Secondary | ICD-10-CM | POA: Diagnosis not present

## 2018-04-11 ENCOUNTER — Encounter (HOSPITAL_COMMUNITY): Payer: Self-pay | Admitting: Emergency Medicine

## 2018-04-11 ENCOUNTER — Emergency Department (HOSPITAL_COMMUNITY): Payer: Medicare Other

## 2018-04-11 ENCOUNTER — Emergency Department (HOSPITAL_COMMUNITY)
Admission: EM | Admit: 2018-04-11 | Discharge: 2018-04-11 | Disposition: A | Discharge status: Home / Self Care | Payer: Medicare Other | Attending: Emergency Medicine | Admitting: Emergency Medicine

## 2018-04-11 DIAGNOSIS — S2241XA Multiple fractures of ribs, right side, initial encounter for closed fracture: Secondary | ICD-10-CM | POA: Diagnosis not present

## 2018-04-11 DIAGNOSIS — E119 Type 2 diabetes mellitus without complications: Secondary | ICD-10-CM | POA: Insufficient documentation

## 2018-04-11 DIAGNOSIS — R0781 Pleurodynia: Secondary | ICD-10-CM | POA: Diagnosis not present

## 2018-04-11 DIAGNOSIS — E039 Hypothyroidism, unspecified: Secondary | ICD-10-CM | POA: Insufficient documentation

## 2018-04-11 DIAGNOSIS — Y92009 Unspecified place in unspecified non-institutional (private) residence as the place of occurrence of the external cause: Secondary | ICD-10-CM | POA: Diagnosis not present

## 2018-04-11 DIAGNOSIS — Z79899 Other long term (current) drug therapy: Secondary | ICD-10-CM | POA: Diagnosis not present

## 2018-04-11 DIAGNOSIS — Y939 Activity, unspecified: Secondary | ICD-10-CM | POA: Diagnosis not present

## 2018-04-11 DIAGNOSIS — I10 Essential (primary) hypertension: Secondary | ICD-10-CM | POA: Insufficient documentation

## 2018-04-11 DIAGNOSIS — I4891 Unspecified atrial fibrillation: Secondary | ICD-10-CM | POA: Diagnosis not present

## 2018-04-11 DIAGNOSIS — W19XXXA Unspecified fall, initial encounter: Secondary | ICD-10-CM | POA: Insufficient documentation

## 2018-04-11 DIAGNOSIS — R52 Pain, unspecified: Secondary | ICD-10-CM | POA: Diagnosis not present

## 2018-04-11 DIAGNOSIS — Y999 Unspecified external cause status: Secondary | ICD-10-CM | POA: Diagnosis not present

## 2018-04-11 LAB — BASIC METABOLIC PANEL
Anion gap: 11 (ref 5–15)
BUN: 21 mg/dL (ref 8–23)
CALCIUM: 9.1 mg/dL (ref 8.9–10.3)
CO2: 25 mmol/L (ref 22–32)
CREATININE: 1.56 mg/dL — AB (ref 0.44–1.00)
Chloride: 103 mmol/L (ref 98–111)
GFR calc Af Amer: 35 mL/min — ABNORMAL LOW (ref >60)
GFR calc non Af Amer: 31 mL/min — ABNORMAL LOW (ref >60)
Glucose, Bld: 223 mg/dL — ABNORMAL HIGH (ref 70–99)
Potassium: 4.1 mmol/L (ref 3.5–5.1)
Sodium: 139 mmol/L (ref 135–145)

## 2018-04-11 LAB — CBC WITH DIFFERENTIAL/PLATELET
Abs Immature Granulocytes: 0.05 10*3/uL (ref 0.00–0.07)
Basophils Absolute: 0.1 10*3/uL (ref 0.0–0.1)
Basophils Relative: 1 %
EOS ABS: 0.2 10*3/uL (ref 0.0–0.5)
Eosinophils Relative: 2 %
HCT: 38.8 % (ref 36.0–46.0)
Hemoglobin: 12.1 g/dL (ref 12.0–15.0)
Immature Granulocytes: 0 %
Lymphocytes Relative: 11 %
Lymphs Abs: 1.5 10*3/uL (ref 0.7–4.0)
MCH: 30 pg (ref 26.0–34.0)
MCHC: 31.2 g/dL (ref 30.0–36.0)
MCV: 96.3 fL (ref 80.0–100.0)
MONOS PCT: 6 %
Monocytes Absolute: 0.8 10*3/uL (ref 0.1–1.0)
Neutro Abs: 10.9 10*3/uL — ABNORMAL HIGH (ref 1.7–7.7)
Neutrophils Relative %: 80 %
Platelets: 271 10*3/uL (ref 150–400)
RBC: 4.03 MIL/uL (ref 3.87–5.11)
RDW: 12.2 % (ref 11.5–15.5)
WBC: 13.5 10*3/uL — ABNORMAL HIGH (ref 4.0–10.5)
nRBC: 0 % (ref 0.0–0.2)

## 2018-04-11 MED ORDER — HYDROCODONE-ACETAMINOPHEN 5-325 MG PO TABS: 1.0000 | ORAL_TABLET | Freq: Four times a day (QID) | ORAL | 0 refills | Status: DC | PRN

## 2018-04-11 MED ORDER — HYDROCODONE-ACETAMINOPHEN 5-325 MG PO TABS
1.0000 | ORAL_TABLET | Freq: Once | ORAL | Status: AC
  Administered 2018-04-11: 1 via ORAL
  Filled 2018-04-11: qty 1

## 2018-04-11 MED ORDER — HYDROCODONE-ACETAMINOPHEN 5-325 MG PO TABS
2.0000 | ORAL_TABLET | Freq: Once | ORAL | Status: AC
  Administered 2018-04-11: 2 via ORAL
  Filled 2018-04-11: qty 2

## 2018-04-11 MED ORDER — ONDANSETRON 4 MG PO TBDP
8.0000 mg | ORAL_TABLET | Freq: Once | ORAL | Status: AC
  Administered 2018-04-11: 8 mg via ORAL
  Filled 2018-04-11: qty 2

## 2018-04-11 NOTE — ED Triage Notes (Addendum)
Patient arrived with EMS from home lost her balance/stumbled and fell on carpeted floor , she hit her right side against the couch arm , presents with right lareral ribcage pain , respirations unlabored , CBG= 110 by EMS , she took Oxycodone prior to arrival but vomitted once. Pain increases with deep inspiration/movement and palpation .

## 2018-04-11 NOTE — ED Provider Notes (Signed)
[facility] EMERGENCY DEPARTMENT Provider Note   CSN: [id number] Arrival date & time: 04/11/18  0218     History   Chief Complaint Chief Complaint  Patient presents with  . Fall    Rib Pain    Patient gives permission to perform history and physical in front of family/friend HPI Summer Hawkins is a 84 y.o. female.  The history is provided by the patient and the spouse.  Fall  This is a new problem. The current episode started 1 to 2 hours ago. The problem occurs constantly. The problem has been gradually worsening. Associated symptoms include chest pain. Pertinent negatives include no abdominal pain, no headaches and no shortness of breath. Exacerbated by: Palpation. Nothing relieves the symptoms.   Patient with history of atrial fibrillation, hypertension, hyperlipidemia presents after fall.  She reports she stumbled on shoes at home, and she fell on her right side.  She reports the right side of her chest ran into a couch.  No LOC.  No head or neck injury.  She reports diffuse right-sided chest wall pain No abdominal pain. Past Medical History:  Diagnosis Date  . Atrial fibrillation ([place])   . Breast cancer ([place])   . Breast cancer, left ([place])   . Chronic kidney disease    stage III - patient was unaware  . Diabetes mellitus   . Dyspnea   . Dysrhythmia    Afib  . H/O: hysterectomy   . History of colonoscopy 01/25/2010  . History of mammogram 08/04/2009  . Hyperlipidemia   . Hypertension   . Hypothyroidism   . Ketoacidosis, diabetic, no coma, non-insulin dependent    Type II  . Vitamin B12 deficiency     Patient Active Problem List   Diagnosis Date Noted  . Bradycardia 09/25/2017  . Acute congestive heart failure ([place])   . Permanent atrial fibrillation   . Chronic diastolic CHF (congestive heart failure) ([place]) 08/08/2017  . Essential hypertension 08/08/2017  . Breast cancer of upper-outer quadrant of left female breast ([place]) 01/05/2016  .  Chronic atrial fibrillation 01/05/2011    Past Surgical History:  Procedure Laterality Date  . ABDOMINAL HYSTERECTOMY    . Bilateral foot surgery    . BREAST EXCISIONAL BIOPSY    . BREAST LUMPECTOMY Left    2017  . BREAST LUMPECTOMY WITH RADIOACTIVE SEED AND SENTINEL LYMPH NODE BIOPSY Left 01/19/2016   Procedure: LEFT BREAST LUMPECTOMY WITH RADIOACTIVE SEED AND SENTINEL LYMPH NODE BIOPSY;  Surgeon: Autumn Messing III, MD;  Location: [place] [facility];  Service: General;  Laterality: Left;  . BREAST LUMPECTOMY WITH RADIOACTIVE SEED LOCALIZATION Left 01/19/2016  . CARDIOVERSION  02/27/2011   Procedure: CARDIOVERSION;  Surgeon: Loralie Champagne, MD;  Location: [facility];  Service: Cardiovascular;  Laterality: N/A;  . LAPAROSCOPIC CHOLECYSTECTOMY    . ROTATOR CUFF REPAIR Left   . TUBAL LIGATION       OB History   No obstetric history on file.      Home Medications    Prior to Admission medications   Medication Sig Start Date End Date Taking? Authorizing Provider  amLODipine (NORVASC) 5 MG tablet TAKE 1 TABLET(5 MG) BY MOUTH TWICE DAILY 03/07/18   Bhagat, Bhavinkumar, PA  anastrozole (ARIMIDEX) 1 MG tablet TAKE 1 TABLET(1 MG) BY MOUTH DAILY 11/20/17   Nicholas Lose, MD  apixaban (ELIQUIS) 5 MG TABS tablet Take 1 tablet (5 mg total) by mouth 2 (two) times daily. 11/16/16   Nicholas Lose, MD  cyanocobalamin 1000  MCG tablet Take 1,000 mcg by mouth daily.     [provider]  ferrous sulfate 325 (65 FE) MG tablet Take 325 mg by mouth daily with breakfast.      [provider]  furosemide (LASIX) 20 MG tablet Take one tablet by mouth daily as needed for swelling or weight gain 01/02/18   Burnell Blanks, MD  glimepiride (AMARYL) 2 MG tablet Take 2 mg by mouth daily with breakfast.  09/11/11   [provider]  levothyroxine (SYNTHROID) 100 MCG tablet Take 1 tablet (100 mcg total) by mouth daily before breakfast. 12/27/17   Burnell Blanks, MD  losartan (COZAAR) 50 MG tablet  Take 50 mg by mouth daily. 10/18/13   [provider]  metFORMIN (GLUCOPHAGE) 850 MG tablet Take 850 mg by mouth daily with breakfast.     [provider]  Multiple Vitamins-Minerals (MULTIVITAMIN WITH MINERALS) tablet Take 1 tablet by mouth daily.      [provider]  TOUJEO SOLOSTAR 300 UNIT/ML SOPN Inject 30 Units into the skin daily.  03/12/15   [provider]    Family History Family History  Problem Relation Age of Onset  . Heart failure Father 14       enlarged heart  . Pneumonia Mother 29  . Diabetes Mother 70  . Breast cancer Sister   . Cancer Neg Hx     Social History Social History   Tobacco Use  . Smoking status: Never Smoker  . Smokeless tobacco: Never Used  Substance Use Topics  . Alcohol use: No    Alcohol/week: 0.0 standard drinks  . Drug use: No     Allergies   Bee venom and Penicillins   Review of Systems Review of Systems  Constitutional: Negative for fever.  Respiratory: Negative for shortness of breath.   Cardiovascular: Positive for chest pain.  Gastrointestinal: Negative for abdominal pain and vomiting.  Musculoskeletal: Negative for back pain and neck pain.  Neurological: Negative for headaches.  All other systems reviewed and are negative.    Physical Exam Updated Vital Signs BP (!) 171/87 (BP Location: Right Arm)   Pulse 93   Temp 97.8 F (36.6 C) (Oral)   Resp 18   Ht 1.626 m (5\' 4" )   Wt (!) 142.9 kg   LMP  (LMP Unknown)   SpO2 94%   BMI 54.07 kg/m   Physical Exam CONSTITUTIONAL: Well developed/well nourished HEAD: Normocephalic/atraumatic EYES: EOMI/PERRL ENMT: Mucous membranes moist NECK: supple no meningeal signs SPINE/BACK:entire spine nontender no bruising/crepitance/stepoffs noted to spine CV: Irregular, no loud murmurs Chest-diffuse right-sided chest wall tenderness without crepitus, no bruising LUNGS: Lungs are clear to auscultation bilaterally, no apparent distress ABDOMEN:  soft, nontender, no rebound or guarding, bowel sounds noted throughout abdomen, no right upper quadrant tenderness, no bruising GU:no cva tenderness NEURO: Pt is awake/alert/appropriate, moves all extremitiesx4.  No facial droop.   EXTREMITIES: pulses normal/equal, full ROM, pelvis stable, all other extremities/joints palpated/ranged and nontender SKIN: warm, color normal PSYCH: no abnormalities of mood noted, alert and oriented to situation   ED Treatments / Results  Labs (all labs ordered are listed, but only abnormal results are displayed) Labs Reviewed  BASIC METABOLIC PANEL - Abnormal; Notable for the following components:      Result Value   Glucose, Bld 223 (*)    Creatinine, Ser 1.56 (*)    GFR calc non Af Amer 31 (*)    GFR calc Af Amer 35 (*)  All other components within normal limits  CBC WITH DIFFERENTIAL/PLATELET - Abnormal; Notable for the following components:   WBC 13.5 (*)    Neutro Abs 10.9 (*)    All other components within normal limits  CBC WITH DIFFERENTIAL/PLATELET    EKG EKG Interpretation  Date/Time:  Thursday April 11 2018 04:35:05 EST Ventricular Rate:  87 PR Interval:    QRS Duration: 94 QT Interval:  409 QTC Calculation: 445 R Axis:   14 Text Interpretation:  Atrial fibrillation Ventricular bigeminy Anterior infarct, old Nonspecific T abnormalities, lateral leads No significant change since last tracing Confirmed by Ripley Fraise [phone]) on 04/11/2018 4:55:08 AM   Radiology Dg Ribs Unilateral W/chest Right  Result Date: 04/11/2018 CLINICAL DATA:  Right rib pain after fall today trying to turn off TV. EXAM: RIGHT RIBS AND CHEST - 3+ VIEW COMPARISON:  Chest radiograph 09/25/2017 FINDINGS: Minimally displaced right lateral fifth and sixth rib fractures. No pulmonary complication such as pneumothorax or hemothorax. No right lung opacity. Unchanged heart size and mediastinal contours with stable cardiomegaly. Subsegmental atelectasis or scarring  in the left lung. Surgical clips in the left breast. IMPRESSION: Minimally displaced right lateral fifth and sixth rib fractures. No pulmonary complication. Electronically Signed   By: Keith Rake M.D.   On: 04/11/2018 03:53    Procedures Procedures  FRACTURE CARE - RIB FRACTURE Patient with 2 rib fractures.  Discussed need for pain control, use of incentive spirometer.  Discussed need to return if any fever, cough, hemoptysis or shortness of breath Medications Ordered in ED Medications  ondansetron (ZOFRAN-ODT) disintegrating tablet 8 mg (8 mg Oral Given 04/11/18 0257)  HYDROcodone-acetaminophen (NORCO/VICODIN) 5-325 MG per tablet 2 tablet (2 tablets Oral Given 04/11/18 0256)  HYDROcodone-acetaminophen (NORCO/VICODIN) 5-325 MG per tablet 1 tablet (1 tablet Oral Given 04/11/18 2355)     Initial Impression / Assessment and Plan / ED Course  I have reviewed the triage vital signs and the nursing notes.  Pertinent labs & imaging results that were available during my care of the patient were reviewed by me and considered in my medical decision making (see chart for details).     4:07 AM Patient sustained a mechanical fall landing on her right chest.  She has sustained rib fractures.  She is on anticoagulation.  No signs of any abdominal trauma.  No signs of any head or spinal trauma.  She did report dizziness upon standing.  Will obtain labs and EKG. 6:54 AM Patient is improved.  She can ambulate with moderate assistance, but she does feel steady.  No other pain reported.  She specifically denies any abdominal pain.  She is in no distress at this time. No signs of any acute head or spinal trauma, therefore will defer imaging. She feels comfortable for discharge home Short course of pain medications prescribed. Discussed strict return precautions Final Clinical Impressions(s) / ED Diagnoses   Final diagnoses:  Closed fracture of multiple ribs of right side, initial encounter    ED  Discharge Orders         Ordered    HYDROcodone-acetaminophen (NORCO/VICODIN) 5-325 MG tablet  Every 6 hours PRN     04/11/18 7322           Ripley Fraise, MD 04/11/18 [phone]

## 2018-04-11 NOTE — ED Notes (Signed)
Patient ambulated hallway with moderate assistance .

## 2018-04-18 ENCOUNTER — Ambulatory Visit: Payer: Medicare Other | Admitting: Cardiovascular Disease

## 2018-04-18 NOTE — Progress Notes (Signed)
Cardiology Office Note:    Date:  04/19/2018   ID:  PRENTISS HAMMETT, DOB Mar 29, 1934, MRN [id number]  PCP:  Haywood Pao, MD  Cardiologist:  Lauree Chandler, MD   Electrophysiologist:  None   Referring MD: Haywood Pao, MD   Chief Complaint  Patient presents with  . Follow-up    CHF, AFib     History of Present Illness:    Summer Hawkins is a 84 y.o. female with permanent atrial fibrillation, diastolic heart failure, diabetes, hypertension, hyperlipidemia, iron deficiency, chronic kidney disease.  Summer Hawkins was seen in 07/2017 for symptomatic bradycardia and her beta-blocker dose was reduced.  A 48 Hour Holter did not demonstrate significant bradycardia.  Summer Hawkins was then admitted 09/2017 with symptomatic bradycardia and decompensated heart failure. Her HR was in the 40s and her beta-blocker was discontinued with improved heart rate.  Last seen by Dr. Angelena Form in 12/2017.  Summer Hawkins wore an event monitor that did not show any evidence of high grade heart block.  Summer Hawkins saw Dr. Curt Bears and pacer implantation was not felt to be warranted a that time.        Summer Hawkins returns for follow-up.  Summer Hawkins is here alone.  Summer Hawkins fell last week and fractured a couple of ribs on the right.  Summer Hawkins was getting up to get her TV remote and tripped on her shoes.  Summer Hawkins did not have any dizziness or near syncope prior to falling.  Summer Hawkins did feel somewhat nauseated and near syncopal after falling due to the pain.  Otherwise, Summer Hawkins denies syncope, paroxysmal nocturnal dyspnea, substernal chest pain or significantly worsening lower extremity swelling.  Summer Hawkins has not had any significant shortness of breath.  Prior CV studies:   The following studies were reviewed today:  Event Monitor 12/2017 Atrial fibrillation Rates between 31 beats per minute up to 140 beats per minute No long pauses.  Summer Hawkins has been seen in EP. Pacemaker not indicated currently in absence of symptoms that can be attributed to bradycardia. (syncope or worsened  weakness).   48-hour Holter 08/01/2017 Atrial fibrillation during monitoring period. No heart rate below 57 bpm.  PVCs (1072 during 48 hour monitoring period)  Echo 07/26/2017 Mild LVH, EF 55-60, normal wall motion, mild to moderate MAC, mild LAE, PASP 36  Nuclear stress test 01/06/2011 Low risk, small area of apical reversibility suggesting artifact, EF 52  Past Medical History:  Diagnosis Date  . Atrial fibrillation ([place])   . Breast cancer ([place])   . Breast cancer, left ([place])   . Chronic kidney disease    stage III - patient was unaware  . Diabetes mellitus   . Dyspnea   . Dysrhythmia    Afib  . H/O: hysterectomy   . History of colonoscopy 01/25/2010  . History of mammogram 08/04/2009  . Hyperlipidemia   . Hypertension   . Hypothyroidism   . Ketoacidosis, diabetic, no coma, non-insulin dependent    Type II  . Vitamin B12 deficiency    Surgical Hx: The patient  has a past surgical history that includes Bilateral foot surgery; Abdominal hysterectomy; Tubal ligation; Rotator cuff repair (Left); Laparoscopic cholecystectomy; Cardioversion (02/27/2011); Breast lumpectomy with radioactive seed localization (Left, 01/19/2016); Breast lumpectomy with radioactive seed and sentinel lymph node biopsy (Left, 01/19/2016); Breast lumpectomy (Left); and Breast excisional biopsy.   Current Medications: Current Meds  Medication Sig  . amLODipine (NORVASC) 5 MG tablet TAKE 1 TABLET(5 MG) BY MOUTH TWICE DAILY  . anastrozole (ARIMIDEX) 1 MG tablet  TAKE 1 TABLET(1 MG) BY MOUTH DAILY  . apixaban (ELIQUIS) 5 MG TABS tablet Take 1 tablet (5 mg total) by mouth 2 (two) times daily.  . BD PEN NEEDLE NANO U/F 32G X 4 MM MISC USE WITH INSULIN PEN QD  . cyanocobalamin 1000 MCG tablet Take 1,000 mcg by mouth daily.   . ferrous sulfate 325 (65 FE) MG tablet Take 325 mg by mouth daily with breakfast.    . fluocinonide (LIDEX) 0.05 % external solution Apply 1 application topically daily.   . furosemide  (LASIX) 20 MG tablet Take one tablet by mouth daily as needed for swelling or weight gain  . glimepiride (AMARYL) 2 MG tablet Take 2 mg by mouth daily with breakfast.   . levothyroxine (SYNTHROID) 100 MCG tablet Take 1 tablet (100 mcg total) by mouth daily before breakfast.  . losartan (COZAAR) 50 MG tablet Take 50 mg by mouth daily.  . metFORMIN (GLUCOPHAGE) 850 MG tablet Take 850 mg by mouth daily with breakfast.   . Multiple Vitamins-Minerals (MULTIVITAMIN WITH MINERALS) tablet Take 1 tablet by mouth daily.    . ONE TOUCH ULTRA TEST test strip USE TO TEST BLOOD SUGAR ONCE DAILY  . TOUJEO SOLOSTAR 300 UNIT/ML SOPN Inject 30 Units into the skin daily.      Allergies:   Bee venom and Penicillins   Social History   Tobacco Use  . Smoking status: Never Smoker  . Smokeless tobacco: Never Used  Substance Use Topics  . Alcohol use: No    Alcohol/week: 0.0 standard drinks  . Drug use: No     Family Hx: The patient's family history includes Breast cancer in her sister; Diabetes (age of onset: 46) in her mother; Heart failure (age of onset: 79) in her father; Pneumonia (age of onset: 12) in her mother. There is no history of Cancer.  ROS:   Please see the history of present illness.    Review of Systems  Cardiovascular: Positive for irregular heartbeat.   All other systems reviewed and are negative.   EKGs/Labs/Other Test Reviewed:    EKG:  EKG is not ordered today.   EKG from 04/11/2018 demonstrated atrial fibrillation with a heart rate of 87, PVCs  Recent Labs: 09/26/2017: ALT 42; B Natriuretic Peptide 349.9; Magnesium 1.9 12/26/2017: TSH 0.055 04/11/2018: BUN 21; Creatinine, Ser 1.56; Hemoglobin 12.1; Platelets 271; Potassium 4.1; Sodium 139   Recent Lipid Panel No results found for: CHOL, TRIG, HDL, CHOLHDL, LDLCALC, LDLDIRECT  Physical Exam:    VS:  BP 136/70   Pulse 65   Ht 5' 4"  (1.626 m)   Wt 223 lb 6.4 oz (101.3 kg)   LMP  (LMP Unknown)   SpO2 97%   BMI 38.35 kg/m      Wt Readings from Last 3 Encounters:  04/19/18 223 lb 6.4 oz (101.3 kg)  04/11/18 (!) 315 lb (142.9 kg)  01/17/18 221 lb (100.2 kg)     Physical Exam  Constitutional: Summer Hawkins is oriented to person, place, and time. Summer Hawkins appears well-developed and well-nourished. No distress.  HENT:  Head: Normocephalic and atraumatic.  Eyes: No scleral icterus.  Neck: No thyromegaly present.  Cardiovascular: Normal rate. An irregularly irregular rhythm present.  No murmur heard. Pulmonary/Chest: Summer Hawkins has no wheezes. Summer Hawkins has no rales.  Abdominal: Soft.  Musculoskeletal:        General: No edema.  Lymphadenopathy:    Summer Hawkins has no cervical adenopathy.  Neurological: Summer Hawkins is alert and oriented to person, place, and  time.  Skin: Skin is warm and dry.  Psychiatric: Summer Hawkins has a normal mood and affect.    ASSESSMENT & PLAN:    Chronic diastolic CHF (congestive heart failure) (HCC) Volume appears stable.  Continue current management.  Permanent atrial fibrillation Heart rate is controlled.  Summer Hawkins remains on Apixaban for anticoagulation.  Summer Hawkins was seen in the emergency room recently for a fall and rib fractures.  Her creatinine at that time was 1.56.  I will arrange a follow-up BMET in 1 month.  If her creatinine continues to remain 1.5 or greater, we will need to decrease her Apixaban to 2.5 mg twice daily.  Recent hemoglobin normal.  Essential hypertension The patient's blood pressure is controlled on her current regimen.  Continue current therapy.   Bradycardia Beta-blocker therapy has been stopped and recent event monitor did not demonstrate high-grade heart block.  There has been no indication for pacemaker implantation.  Summer Hawkins has not had any episodes of syncope.   Dispo:  Return in about 4 months (around 08/18/2018) for Routine Follow Up w/ Dr. Angelena Form.   Medication Adjustments/Labs and Tests Ordered: Current medicines are reviewed at length with the patient today.  Concerns regarding medicines are  outlined above.  Tests Ordered: Orders Placed This Encounter  Procedures  . Basic metabolic panel   Medication Changes: No orders of the defined types were placed in this encounter.   Signed, Richardson Dopp, PA-C  04/19/2018 9:04 AM    [facility] [address], [place], [place]  [address] Phone: [phone]; Fax: [phone]

## 2018-04-18 NOTE — Progress Notes (Deleted)
No chief complaint on file.  History of Present Illness: 84 yo female with with history of persistent atrial fibrillation, DM, hypothyroidism, HTN and hyperlipidemia today for cardiac followup. I saw her in October 2012 as a new patient. She presented for her stress test as ordered by Dr. Osborne Casco and was found to be in atrial fibrillation. She had no awareness of palpitations. No stress induced ischemia on nuclear study. Low risk study. Normal LVEF. I started Toprol XL and Pradaxa. Echo on 01/12/11 with normal LV size and function, LVEF of 55-60%, mild LVH, moderate TR, mild MR. She was cardioverted on 02/27/11. She has been on Eliquis. She was seen in primary care in May 2019 and  dyspnea and weakness. HR was in the 40s on 06/22/17 with atrial fib. Her Toprol dose was reduced to 25 mg daily. She was felt to be volume overloaded and was started on Lasix 20 mg daily. I saw her in our office 07/13/17 and her heart was improved. Her symptoms had resolved. 48 hour cardiac monitor May 2019 showed atrial fib with lowest heart rate 57 bpm, PVCs. Echo May 2019 with LvEF=55-60%, no wall motion abnormalities. No significant valve disease. She admitted to [facility] July 16-18, 2019 with symptomatic bradycardia and decompensated CHF. Her heart rate was in the 40s. Her beta blocker was stopped and her heart rate improved. She was diuresed with IV Lasix. Norvasc added due to uncontrolled HTN. I saw her in the office 12/26/17 due to fatigue and her heart rate was in the 40-50s. Cardiac monitor with nocturnal heart rates of 40s and higher during the day. She was seen in EP clinic by Dr. Lennie Odor and no changes were made.   She is here today for follow up. The patient denies any chest pain, dyspnea, palpitations, lower extremity edema, orthopnea, PND, dizziness, near syncope or syncope.   Primary Care Physician: Haywood Pao, MD  Past Medical History:  Diagnosis Date  . Atrial fibrillation ([place])   . Breast cancer ([place])    . Breast cancer, left ([place])   . Chronic kidney disease    stage III - patient was unaware  . Diabetes mellitus   . Dyspnea   . Dysrhythmia    Afib  . H/O: hysterectomy   . History of colonoscopy 01/25/2010  . History of mammogram 08/04/2009  . Hyperlipidemia   . Hypertension   . Hypothyroidism   . Ketoacidosis, diabetic, no coma, non-insulin dependent    Type II  . Vitamin B12 deficiency     Past Surgical History:  Procedure Laterality Date  . ABDOMINAL HYSTERECTOMY    . Bilateral foot surgery    . BREAST EXCISIONAL BIOPSY    . BREAST LUMPECTOMY Left    2017  . BREAST LUMPECTOMY WITH RADIOACTIVE SEED AND SENTINEL LYMPH NODE BIOPSY Left 01/19/2016   Procedure: LEFT BREAST LUMPECTOMY WITH RADIOACTIVE SEED AND SENTINEL LYMPH NODE BIOPSY;  Surgeon: Autumn Messing III, MD;  Location: [facility];  Service: General;  Laterality: Left;  . BREAST LUMPECTOMY WITH RADIOACTIVE SEED LOCALIZATION Left 01/19/2016  . CARDIOVERSION  02/27/2011   Procedure: CARDIOVERSION;  Surgeon: Loralie Champagne, MD;  Location: [facility];  Service: Cardiovascular;  Laterality: N/A;  . LAPAROSCOPIC CHOLECYSTECTOMY    . ROTATOR CUFF REPAIR Left   . TUBAL LIGATION      Current Outpatient Medications  Medication Sig Dispense Refill  . amLODipine (NORVASC) 5 MG tablet TAKE 1 TABLET(5 MG) BY MOUTH TWICE DAILY 60 tablet 11  . anastrozole (  ARIMIDEX) 1 MG tablet TAKE 1 TABLET(1 MG) BY MOUTH DAILY 90 tablet 3  . apixaban (ELIQUIS) 5 MG TABS tablet Take 1 tablet (5 mg total) by mouth 2 (two) times daily. 60 tablet   . cyanocobalamin 1000 MCG tablet Take 1,000 mcg by mouth daily.     . ferrous sulfate 325 (65 FE) MG tablet Take 325 mg by mouth daily with breakfast.      . furosemide (LASIX) 20 MG tablet Take one tablet by mouth daily as needed for swelling or weight gain 20 tablet 3  . glimepiride (AMARYL) 2 MG tablet Take 2 mg by mouth daily with breakfast.     . HYDROcodone-acetaminophen (NORCO/VICODIN) 5-325 MG tablet Take 1  tablet by mouth every 6 (six) hours as needed for severe pain. 10 tablet 0  . levothyroxine (SYNTHROID) 100 MCG tablet Take 1 tablet (100 mcg total) by mouth daily before breakfast. 30 tablet 2  . losartan (COZAAR) 50 MG tablet Take 50 mg by mouth daily.    . metFORMIN (GLUCOPHAGE) 850 MG tablet Take 850 mg by mouth daily with breakfast.     . Multiple Vitamins-Minerals (MULTIVITAMIN WITH MINERALS) tablet Take 1 tablet by mouth daily.      Nelva Nay SOLOSTAR 300 UNIT/ML SOPN Inject 30 Units into the skin daily.   1   No current facility-administered medications for this visit.     Allergies  Allergen Reactions  . Bee Venom Nausea And Vomiting    Pt feels faint  . Penicillins Anaphylaxis, Hives and Other (See Comments)    Has patient had a PCN reaction causing immediate rash, facial/tongue/throat swelling, SOB or lightheadedness with hypotension: Yes Has patient had a PCN reaction causing severe rash involving mucus membranes or skin necrosis: No Has patient had a PCN reaction that required hospitalization: No Has patient had a PCN reaction occurring within the last 10 years: No If all of the above answers are "NO", then may proceed with Cephalosporin use.     Social History   Socioeconomic History  . Marital status: Married    Spouse name: Not on file  . Number of children: 2  . Years of education: Not on file  . Highest education level: Not on file  Occupational History    Employer: OTHER    Comment: Worked at [employer]  . Financial resource strain: Not on file  . Food insecurity:    Worry: Not on file    Inability: Not on file  . Transportation needs:    Medical: Not on file    Non-medical: Not on file  Tobacco Use  . Smoking status: Never Smoker  . Smokeless tobacco: Never Used  Substance and Sexual Activity  . Alcohol use: No    Alcohol/week: 0.0 standard drinks  . Drug use: No  . Sexual activity: Not on file  Lifestyle  . Physical activity:    Days  per week: Not on file    Minutes per session: Not on file  . Stress: Not on file  Relationships  . Social connections:    Talks on phone: Not on file    Gets together: Not on file    Attends religious service: Not on file    Active member of club or organization: Not on file    Attends meetings of clubs or organizations: Not on file    Relationship status: Not on file  . Intimate partner violence:    Fear of current or ex partner:  Not on file    Emotionally abused: Not on file    Physically abused: Not on file    Forced sexual activity: Not on file  Other Topics Concern  . Not on file  Social History Narrative   Patient since 68   Husband with prostate cancer   10-siblings-no cancer    Family History  Problem Relation Age of Onset  . Heart failure Father 93       enlarged heart  . Pneumonia Mother 1  . Diabetes Mother 5  . Breast cancer Sister   . Cancer Neg Hx     Review of Systems:  As stated in the HPI and otherwise negative.   LMP  (LMP Unknown)   Physical Examination:  General: Well developed, well nourished, NAD  HEENT: OP clear, mucus membranes moist  SKIN: warm, dry. No rashes. Neuro: No focal deficits  Musculoskeletal: Muscle strength 5/5 all ext  Psychiatric: Mood and affect normal  Neck: No JVD, no carotid bruits, no thyromegaly, no lymphadenopathy.  Lungs:Clear bilaterally, no wheezes, rhonci, crackles Cardiovascular: Regular rate and rhythm. No murmurs, gallops or rubs. Abdomen:Soft. Bowel sounds present. Non-tender.  Extremities: No lower extremity edema. Pulses are 2 + in the bilateral DP/PT.  Echo May 2019: - Left ventricle: The cavity size was normal. Wall thickness was   increased in a pattern of mild LVH. Systolic function was normal.   The estimated ejection fraction was in the range of 55% to 60%.   Wall motion was normal; there were no regional wall motion   abnormalities. - Mitral valve: Mildly to moderately calcified annulus.  Mildly   thickened leaflets . - Left atrium: The atrium was mildly dilated. - Pulmonary arteries: Systolic pressure was mildly increased. PA   peak pressure: 36 mm Hg (S).  EKG:  EKG is *** ordered today. The ekg ordered today demonstrates   Recent Labs: 09/26/2017: ALT 42; B Natriuretic Peptide 349.9; Magnesium 1.9 12/26/2017: TSH 0.055 04/11/2018: BUN 21; Creatinine, Ser 1.56; Hemoglobin 12.1; Platelets 271; Potassium 4.1; Sodium 139   Lipid Panel No results found for: CHOL, TRIG, HDL, CHOLHDL, VLDL, LDLCALC, LDLDIRECT   Wt Readings from Last 3 Encounters:  04/11/18 (!) 315 lb (142.9 kg)  01/17/18 221 lb (100.2 kg)  12/26/17 203 lb 12.8 oz (92.4 kg)     Other studies Reviewed: Additional studies/ records that were reviewed today include: . Review of the above records demonstrates:    Assessment and Plan:   1. Atrial fibrillation, permanent: Atrial fib today. Heart rate is ***. She feels well. She is on no rate control agents due to bradycardia. She was seen in the EP clinic November 2019 and no indication for pacemaker. Continue Eliquis.   2. HTN: BP is controlled.   3. Mitral valve insufficiency: Trivial by echo May 2019  4. Acute on chronic diastolic CHF: Volume status is ok. Continue Lasix  5. Hyperthyroidism: Her TSH was very low in October 2019. Synthroid dose was lowered. *** ? Repeated again?    Current medicines are reviewed at length with the patient today.  The patient does not have concerns regarding medicines.  The following changes have been made:  no change  Labs/ tests ordered today include:   No orders of the defined types were placed in this encounter.   Disposition:   FU with me in ***  months  Signed, Lauree Chandler, MD 04/18/2018 [place],  [place]  [address] Phone: [phone]; Fax: [phone]

## 2018-04-19 ENCOUNTER — Ambulatory Visit (INDEPENDENT_AMBULATORY_CARE_PROVIDER_SITE_OTHER): Payer: Medicare Other | Admitting: Physician Assistant

## 2018-04-19 ENCOUNTER — Encounter: Payer: Self-pay | Admitting: Physician Assistant

## 2018-04-19 VITALS — BP 136/70 | HR 65 | Ht 64.0 in | Wt 223.4 lb

## 2018-04-19 DIAGNOSIS — I10 Essential (primary) hypertension: Secondary | ICD-10-CM

## 2018-04-19 DIAGNOSIS — I5032 Chronic diastolic (congestive) heart failure: Secondary | ICD-10-CM | POA: Diagnosis not present

## 2018-04-19 DIAGNOSIS — I4821 Permanent atrial fibrillation: Secondary | ICD-10-CM | POA: Diagnosis not present

## 2018-04-19 DIAGNOSIS — R001 Bradycardia, unspecified: Secondary | ICD-10-CM | POA: Diagnosis not present

## 2018-04-19 NOTE — Patient Instructions (Signed)
Medication Instructions:  Your physician recommends that you continue on your current medications as directed. Please refer to the Current Medication list given to you today.   If you need a refill on your cardiac medications before your next appointment, please call your pharmacy.   Lab work: TO BE DONE IN 4 WEEKS: BMET   If you have labs (blood work) drawn today and your tests are completely normal, you will receive your results only by: Marland Kitchen MyChart Message (if you have MyChart) OR . A paper copy in the mail If you have any lab test that is abnormal or we need to change your treatment, we will call you to review the results.  Testing/Procedures: NONE  Follow-Up: At [facility], you and your health needs are our priority.  As part of our continuing mission to provide you with exceptional heart care, we have created designated Provider Care Teams.  These Care Teams include your primary Cardiologist (physician) and Advanced Practice Providers (APPs -  Physician Assistants and Nurse Practitioners) who all work together to provide you with the care you need, when you need it. . You will need a follow up appointment in:  4 months.  You may see Lauree Chandler, MD or one of the following Advanced Practice Providers on your designated Care Team: . Richardson Dopp, PA-C . Vin Bhagat, PA-C . Daune Perch, NP  Any Other Special Instructions Will Be Listed Below (If Applicable).

## 2018-05-03 DIAGNOSIS — Z1212 Encounter for screening for malignant neoplasm of rectum: Secondary | ICD-10-CM | POA: Diagnosis not present

## 2018-05-15 ENCOUNTER — Other Ambulatory Visit: Payer: Medicare Other | Admitting: *Deleted

## 2018-05-15 DIAGNOSIS — I5032 Chronic diastolic (congestive) heart failure: Secondary | ICD-10-CM | POA: Diagnosis not present

## 2018-05-15 DIAGNOSIS — R001 Bradycardia, unspecified: Secondary | ICD-10-CM

## 2018-05-15 DIAGNOSIS — I4821 Permanent atrial fibrillation: Secondary | ICD-10-CM | POA: Diagnosis not present

## 2018-05-15 DIAGNOSIS — I10 Essential (primary) hypertension: Secondary | ICD-10-CM

## 2018-05-15 LAB — BASIC METABOLIC PANEL
BUN/Creatinine Ratio: 19 (ref 12–28)
BUN: 26 mg/dL (ref 8–27)
CO2: 20 mmol/L (ref 20–29)
CREATININE: 1.37 mg/dL — AB (ref 0.57–1.00)
Calcium: 9.6 mg/dL (ref 8.7–10.3)
Chloride: 103 mmol/L (ref 96–106)
GFR calc Af Amer: 41 mL/min/{1.73_m2} — ABNORMAL LOW (ref >59)
GFR, EST NON AFRICAN AMERICAN: 36 mL/min/{1.73_m2} — AB (ref >59)
Glucose: 65 mg/dL (ref 65–99)
Potassium: 4.2 mmol/L (ref 3.5–5.2)
Sodium: 140 mmol/L (ref 134–144)

## 2018-07-23 ENCOUNTER — Telehealth: Payer: Self-pay | Admitting: Cardiovascular Disease

## 2018-07-23 DIAGNOSIS — H43813 Vitreous degeneration, bilateral: Secondary | ICD-10-CM | POA: Diagnosis not present

## 2018-07-23 DIAGNOSIS — H52203 Unspecified astigmatism, bilateral: Secondary | ICD-10-CM | POA: Diagnosis not present

## 2018-07-23 DIAGNOSIS — E113293 Type 2 diabetes mellitus with mild nonproliferative diabetic retinopathy without macular edema, bilateral: Secondary | ICD-10-CM | POA: Diagnosis not present

## 2018-07-23 DIAGNOSIS — H353132 Nonexudative age-related macular degeneration, bilateral, intermediate dry stage: Secondary | ICD-10-CM | POA: Diagnosis not present

## 2018-07-23 NOTE — Telephone Encounter (Signed)
° °  5/12 :  Left VM for patient to return call regarding visit on 08/26/2018. When patient returns call please transfer to me. Cell # [phone]

## 2018-08-13 NOTE — Telephone Encounter (Signed)
Phone-June [phone] Consent obtained on June 2,2020 Virtual Visit Pre-Appointment Phone Call  "(Name), I am calling you today to discuss your upcoming appointment. We are currently trying to limit exposure to the virus that causes COVID-19 by seeing patients at home rather than in the office."  1. "What is the BEST phone number to call the day of the visit?" - include this in appointment [phone]  2. "Do you have or have access to (through a family member/friend) a smartphone with video capability that we can use for your visit?" yes but pt does not want video visit.  Would like phone visit a. If yes - list this number in appt notes as "cell" (if different from BEST phone #) and list the appointment type as a VIDEO visit in appointment notes b. If no - list the appointment type as a PHONE visit in appointment notes  3. Confirm consent - "In the setting of the current Covid19 crisis, you are scheduled for a phone visit with your provider on June 15,2020 at 9:30.  Just as we do with many in-office visits, in order for you to participate in this visit, we must obtain consent.  If you'd like, I can send this to your mychart (if signed up) or email for you to review.  Otherwise, I can obtain your verbal consent now.  All virtual visits are billed to your insurance company just like a normal visit would be.  By agreeing to a virtual visit, we'd like you to understand that the technology does not allow for your provider to perform an examination, and thus may limit your provider's ability to fully assess your condition. If your provider identifies any concerns that need to be evaluated in person, we will make arrangements to do so.  Finally, though the technology is pretty good, we cannot assure that it will always work on either your or our end, and in the setting of a video visit, we may have to convert it to a phone-only visit.  In either situation, we cannot ensure that we have a secure  connection.  Are you willing to proceed?" STAFF: Did the patient verbally acknowledge consent to telehealth visit? Document YES/NO here: yes  4. Advise patient to be prepared - "Two hours prior to your appointment, go ahead and check your blood pressure, pulse, oxygen saturation, and your weight (if you have the equipment to check those) and write them all down. When your visit starts, your provider will ask you for this information. If you have an Apple Watch or Kardia device, please plan to have heart rate information ready on the day of your appointment. Please have a pen and paper handy nearby the day of the visit as well."  5. Give patient instructions for MyChart download to smartphone OR Doximity/Doxy.me as below if video visit (depending on what platform provider is using)  6. Inform patient they will receive a phone call 15 minutes prior to their appointment time (may be from unknown caller ID) so they should be prepared to answer    TELEPHONE CALL NOTE  Summer Hawkins has been deemed a candidate for a follow-up tele-health visit to limit community exposure during the Covid-19 pandemic. I spoke with the patient via phone to ensure availability of phone/video source, confirm preferred email & phone number, and discuss instructions and expectations.  I reminded Selenne Coggin Gancarz to be prepared with any vital sign and/or heart rhythm information that could potentially be obtained via home monitoring,  at the time of her visit. I reminded Augusta Mirkin Minter to expect a phone call prior to her visit.  Leodis Liverpool, RN 08/13/2018 1:17 PM   INSTRUCTIONS FOR DOWNLOADING THE MYCHART APP TO SMARTPHONE  - The patient must first make sure to have activated MyChart and know their login information - If Apple, go to [employer] and type in MyChart in the search bar and download the app. If Android, ask patient to go to [employer] and type in [place] in the search bar and download the app. The app is  free but as with any other app downloads, their phone may require them to verify saved payment information or Apple/Android password.  - The patient will need to then log into the app with their MyChart username and password, and select [facility] as their healthcare provider to link the account. When it is time for your visit, go to the MyChart app, find appointments, and click Begin Video Visit. Be sure to Select Allow for your device to access the Microphone and Camera for your visit. You will then be connected, and your provider will be with you shortly.  **If they have any issues connecting, or need assistance please contact MyChart service desk [phone])**  **If using a computer, in order to ensure the best quality for their visit they will need to use either of the following Internet Browsers: [employer], or Google Chrome**  IF USING DOXIMITY or DOXY.ME - The patient will receive a link just prior to their visit by text.     FULL LENGTH CONSENT FOR TELE-HEALTH VISIT   I hereby voluntarily request, consent and authorize [facility] and its employed or contracted physicians, physician assistants, nurse practitioners or other licensed health care professionals (the Practitioner), to provide me with telemedicine health care services (the "Services") as deemed necessary by the treating Practitioner. I acknowledge and consent to receive the Services by the Practitioner via telemedicine. I understand that the telemedicine visit will involve communicating with the Practitioner through live audiovisual communication technology and the disclosure of certain medical information by electronic transmission. I acknowledge that I have been given the opportunity to request an in-person assessment or other available alternative prior to the telemedicine visit and am voluntarily participating in the telemedicine visit.  I understand that I have the right to withhold or withdraw my  consent to the use of telemedicine in the course of my care at any time, without affecting my right to future care or treatment, and that the Practitioner or I may terminate the telemedicine visit at any time. I understand that I have the right to inspect all information obtained and/or recorded in the course of the telemedicine visit and may receive copies of available information for a reasonable fee.  I understand that some of the potential risks of receiving the Services via telemedicine include:  Marland Kitchen Delay or interruption in medical evaluation due to technological equipment failure or disruption; . Information transmitted may not be sufficient (e.g. poor resolution of images) to allow for appropriate medical decision making by the Practitioner; and/or  . In rare instances, security protocols could fail, causing a breach of personal health information.  Furthermore, I acknowledge that it is my responsibility to provide information about my medical history, conditions and care that is complete and accurate to the best of my ability. I acknowledge that Practitioner's advice, recommendations, and/or decision may be based on factors not within their control, such as incomplete or inaccurate  data provided by me or distortions of diagnostic images or specimens that may result from electronic transmissions. I understand that the practice of medicine is not an exact science and that Practitioner makes no warranties or guarantees regarding treatment outcomes. I acknowledge that I will receive a copy of this consent concurrently upon execution via email to the email address I last provided but may also request a printed copy by calling the office of [facility].    I understand that my insurance will be billed for this visit.   I have read or had this consent read to me. . I understand the contents of this consent, which adequately explains the benefits and risks of the Services being provided via telemedicine.   . I have been provided ample opportunity to ask questions regarding this consent and the Services and have had my questions answered to my satisfaction. . I give my informed consent for the services to be provided through the use of telemedicine in my medical care  By participating in this telemedicine visit I agree to the above.

## 2018-08-26 ENCOUNTER — Encounter: Payer: Self-pay | Admitting: Cardiovascular Disease

## 2018-08-26 ENCOUNTER — Other Ambulatory Visit: Payer: Self-pay

## 2018-08-26 ENCOUNTER — Telehealth (INDEPENDENT_AMBULATORY_CARE_PROVIDER_SITE_OTHER): Payer: Medicare Other | Admitting: Cardiovascular Disease

## 2018-08-26 VITALS — BP 129/85 | HR 104 | Ht 64.0 in | Wt 219.0 lb

## 2018-08-26 DIAGNOSIS — I4821 Permanent atrial fibrillation: Secondary | ICD-10-CM

## 2018-08-26 DIAGNOSIS — I10 Essential (primary) hypertension: Secondary | ICD-10-CM

## 2018-08-26 DIAGNOSIS — I5032 Chronic diastolic (congestive) heart failure: Secondary | ICD-10-CM

## 2018-08-26 DIAGNOSIS — I340 Nonrheumatic mitral (valve) insufficiency: Secondary | ICD-10-CM

## 2018-08-26 NOTE — Progress Notes (Signed)
Virtual Visit via Video Note   This visit type was conducted due to national recommendations for restrictions regarding the COVID-19 Pandemic (e.g. social distancing) in an effort to limit this patient's exposure and mitigate transmission in our community.  Due to her co-morbid illnesses, this patient is at least at moderate risk for complications without adequate follow up.  This format is felt to be most appropriate for this patient at this time.  All issues noted in this document were discussed and addressed.  A limited physical exam was performed with this format.  Please refer to the patient's chart for her consent to telehealth for [facility].   Date:  08/26/2018   ID:  Summer Hawkins, DOB 1934-04-10, MRN [id number]  Patient Location: Home Provider Location: Office  PCP:  Haywood Pao, MD  Cardiologist:  Lauree Chandler, MD  Electrophysiologist:  None   Evaluation Performed:  Follow-Up Visit  Chief Complaint:  Follow up- Atrial fib  History of Present Illness:    Summer Hawkins is a 84 y.o. female with history of persistent atrial fibrillation, DM, hypothyroidism, HTN and hyperlipidemia who is being seen today by virtual e-visit due to the Covid19 pandemic. I saw her in October 2012 as a new patient. She presented for her stress test as ordered by Dr. Osborne Casco and was found to be in atrial fibrillation. She had no awareness of palpitations. No stress induced ischemia on nuclear study. Low risk study. Normal LVEF. She was started on Toprol and Pradaxa. Echo in 2012  with normal LV size and function, LVEF of 55-60%, mild LVH, mild MR. She was cardioverted on 02/27/11. She has been on Eliquis over the past few years. She was seen in primary care in May 2019 with dyspnea and weakness. HR was in the 40s on 06/22/17 with atrial fib. Her Toprol dose was reduced to 25 mg daily. She was felt to be volume overloaded and was started on Lasix 20 mg daily. I saw her in our office  07/13/17 and her heart was improved. Her symptoms had resolved. 48 hour cardiac monitor May 2019 showed atrial fib with lowest heart rate 57 bpm, PVCs. Echo May 2019 with LvEF=55-60%, no wall motion abnormalities. No significant valve disease. She admitted to [facility] July 2019 with symptomatic bradycardia and decompensated CHF. Her heart rate was in the 40s. Her beta blocker was stopped and her heart rate improved. She was diuresed with IV Lasix. Norvasc added due to uncontrolled HTN. Event monitor October 2019 with no high grade AV block. She was seen by EP and no pacemaker was recommended.   The patient denies chest pain, dyspnea, palpitations, dizziness, near syncope or syncope. No lower extremity edema.   The patient does not have symptoms concerning for COVID-19 infection (fever, chills, cough, or new shortness of breath).    Past Medical History:  Diagnosis Date  . Atrial fibrillation ([place])   . Breast cancer ([place])   . Breast cancer, left ([place])   . Chronic kidney disease    stage III - patient was unaware  . Diabetes mellitus   . Dyspnea   . Dysrhythmia    Afib  . H/O: hysterectomy   . History of colonoscopy 01/25/2010  . History of mammogram 08/04/2009  . Hyperlipidemia   . Hypertension   . Hypothyroidism   . Ketoacidosis, diabetic, no coma, non-insulin dependent    Type II  . Vitamin B12 deficiency    Past Surgical History:  Procedure  Laterality Date  . ABDOMINAL HYSTERECTOMY    . Bilateral foot surgery    . BREAST EXCISIONAL BIOPSY    . BREAST LUMPECTOMY Left    2017  . BREAST LUMPECTOMY WITH RADIOACTIVE SEED AND SENTINEL LYMPH NODE BIOPSY Left 01/19/2016   Procedure: LEFT BREAST LUMPECTOMY WITH RADIOACTIVE SEED AND SENTINEL LYMPH NODE BIOPSY;  Surgeon: Autumn Messing III, MD;  Location: [facility];  Service: General;  Laterality: Left;  . BREAST LUMPECTOMY WITH RADIOACTIVE SEED LOCALIZATION Left 01/19/2016  . CARDIOVERSION  02/27/2011   Procedure: CARDIOVERSION;  Surgeon: Loralie Champagne, MD;  Location: [facility];  Service: Cardiovascular;  Laterality: N/A;  . LAPAROSCOPIC CHOLECYSTECTOMY    . ROTATOR CUFF REPAIR Left   . TUBAL LIGATION       Current Meds  Medication Sig  . amLODipine (NORVASC) 5 MG tablet TAKE 1 TABLET(5 MG) BY MOUTH TWICE DAILY  . anastrozole (ARIMIDEX) 1 MG tablet TAKE 1 TABLET(1 MG) BY MOUTH DAILY  . apixaban (ELIQUIS) 5 MG TABS tablet Take 1 tablet (5 mg total) by mouth 2 (two) times daily.  . BD PEN NEEDLE NANO U/F 32G X 4 MM MISC USE WITH INSULIN PEN QD  . ferrous sulfate 325 (65 FE) MG tablet Take 325 mg by mouth daily with breakfast.    . furosemide (LASIX) 20 MG tablet Take one tablet by mouth daily as needed for swelling or weight gain  . glimepiride (AMARYL) 2 MG tablet Take 2 mg by mouth daily with breakfast.   . levothyroxine (SYNTHROID) 137 MCG tablet Take 137 mcg by mouth daily.  Marland Kitchen losartan (COZAAR) 100 MG tablet Take 100 mg by mouth daily.  . metFORMIN (GLUCOPHAGE) 850 MG tablet Take 850 mg by mouth 2 (two) times daily with a meal.   . Multiple Vitamins-Minerals (MULTIVITAMIN WITH MINERALS) tablet Take 1 tablet by mouth daily.    . ONE TOUCH ULTRA TEST test strip USE TO TEST BLOOD SUGAR ONCE DAILY  . TOUJEO SOLOSTAR 300 UNIT/ML SOPN Inject 30 Units into the skin daily.      Allergies:   Bee venom and Penicillins   Social History   Tobacco Use  . Smoking status: Never Smoker  . Smokeless tobacco: Never Used  Substance Use Topics  . Alcohol use: No    Alcohol/week: 0.0 standard drinks  . Drug use: No     Family Hx: The patient's family history includes Breast cancer in her sister; Diabetes (age of onset: 27) in her mother; Heart failure (age of onset: 35) in her father; Pneumonia (age of onset: 47) in her mother. There is no history of Cancer.  ROS:   Please see the history of present illness.    All other systems reviewed and are negative.   Prior CV studies:   The following studies were reviewed today:  Echo May  2019: - Left ventricle: The cavity size was normal. Wall thickness was   increased in a pattern of mild LVH. Systolic function was normal.   The estimated ejection fraction was in the range of 55% to 60%.   Wall motion was normal; there were no regional wall motion   abnormalities. - Mitral valve: Mildly to moderately calcified annulus. Mildly   thickened leaflets . - Left atrium: The atrium was mildly dilated. - Pulmonary arteries: Systolic pressure was mildly increased. PA   peak pressure: 36 mm Hg (S).  Labs/Other Tests and Data Reviewed:    EKG:  No ECG reviewed.  Recent Labs: 09/26/2017: ALT  42; B Natriuretic Peptide 349.9; Magnesium 1.9 12/26/2017: TSH 0.055 04/11/2018: Hemoglobin 12.1; Platelets 271 05/15/2018: BUN 26; Creatinine, Ser 1.37; Potassium 4.2; Sodium 140   Recent Lipid Panel No results found for: CHOL, TRIG, HDL, CHOLHDL, LDLCALC, LDLDIRECT  Wt Readings from Last 3 Encounters:  08/26/18 219 lb (99.3 kg)  04/19/18 223 lb 6.4 oz (101.3 kg)  04/11/18 (!) 315 lb (142.9 kg)     Objective:    Vital Signs:  BP 129/85   Pulse (!) 104   Ht 5\' 4"  (1.626 m)   Wt 219 lb (99.3 kg)   LMP  (LMP Unknown)   BMI 37.59 kg/m    VITAL SIGNS:  reviewed GEN:  no acute distress  ASSESSMENT & PLAN:    1. Atrial fibrillation, permanent/Bradycardia: She is on Eliquis. No rate controlling agents due to bradycardia in the past on metoprolol. Currently with no dizziness. She has been seen by Dr. Lennie Odor in the EP clinic and not felt need a pacemaker. Continue Eliquis.    2. HTN: BP is controlled  3. Mitral valve insufficiency: Trivial by echo May 2019  4. Chronic diastolic CHF: Weight stable at home per pt. Continue Lasix.    COVID-19 Education: The signs and symptoms of COVID-19 were discussed with the patient and how to seek care for testing (follow up with PCP or arrange E-visit).  The importance of social distancing was discussed today.  Time:   Today, I have spent  17 minutes with the patient with telehealth technology discussing the above problems.     Medication Adjustments/Labs and Tests Ordered: Current medicines are reviewed at length with the patient today.  Concerns regarding medicines are outlined above.   Tests Ordered: No orders of the defined types were placed in this encounter.   Medication Changes: No orders of the defined types were placed in this encounter.   Disposition:  Follow up in 6 month(s)  Signed, Lauree Chandler, MD  08/26/2018 9:34 AM     [facility]

## 2018-08-26 NOTE — Patient Instructions (Signed)

## 2018-09-18 DIAGNOSIS — H43812 Vitreous degeneration, left eye: Secondary | ICD-10-CM | POA: Diagnosis not present

## 2018-09-18 DIAGNOSIS — H43811 Vitreous degeneration, right eye: Secondary | ICD-10-CM | POA: Diagnosis not present

## 2018-09-18 DIAGNOSIS — H353131 Nonexudative age-related macular degeneration, bilateral, early dry stage: Secondary | ICD-10-CM | POA: Diagnosis not present

## 2018-09-18 DIAGNOSIS — E113393 Type 2 diabetes mellitus with moderate nonproliferative diabetic retinopathy without macular edema, bilateral: Secondary | ICD-10-CM | POA: Diagnosis not present

## 2018-10-10 DIAGNOSIS — I130 Hypertensive heart and chronic kidney disease with heart failure and stage 1 through stage 4 chronic kidney disease, or unspecified chronic kidney disease: Secondary | ICD-10-CM | POA: Diagnosis not present

## 2018-10-10 DIAGNOSIS — E038 Other specified hypothyroidism: Secondary | ICD-10-CM | POA: Diagnosis not present

## 2018-10-10 DIAGNOSIS — Z7901 Long term (current) use of anticoagulants: Secondary | ICD-10-CM | POA: Diagnosis not present

## 2018-10-10 DIAGNOSIS — I480 Paroxysmal atrial fibrillation: Secondary | ICD-10-CM | POA: Diagnosis not present

## 2018-10-10 DIAGNOSIS — E039 Hypothyroidism, unspecified: Secondary | ICD-10-CM | POA: Diagnosis not present

## 2018-10-10 DIAGNOSIS — E7800 Pure hypercholesterolemia, unspecified: Secondary | ICD-10-CM | POA: Diagnosis not present

## 2018-10-10 DIAGNOSIS — E1129 Type 2 diabetes mellitus with other diabetic kidney complication: Secondary | ICD-10-CM | POA: Diagnosis not present

## 2018-10-10 DIAGNOSIS — I509 Heart failure, unspecified: Secondary | ICD-10-CM | POA: Diagnosis not present

## 2018-10-10 DIAGNOSIS — N3281 Overactive bladder: Secondary | ICD-10-CM | POA: Diagnosis not present

## 2018-10-10 DIAGNOSIS — N183 Chronic kidney disease, stage 3 (moderate): Secondary | ICD-10-CM | POA: Diagnosis not present

## 2018-10-10 DIAGNOSIS — R809 Proteinuria, unspecified: Secondary | ICD-10-CM | POA: Diagnosis not present

## 2018-10-24 DIAGNOSIS — G5601 Carpal tunnel syndrome, right upper limb: Secondary | ICD-10-CM | POA: Insufficient documentation

## 2018-11-15 ENCOUNTER — Encounter (HOSPITAL_COMMUNITY): Payer: Self-pay

## 2018-11-15 ENCOUNTER — Emergency Department (HOSPITAL_COMMUNITY)
Admission: EM | Admit: 2018-11-15 | Discharge: 2018-11-15 | Disposition: A | Discharge status: Home / Self Care | Payer: Medicare Other | Attending: Emergency Medicine | Admitting: Emergency Medicine

## 2018-11-15 ENCOUNTER — Emergency Department (HOSPITAL_COMMUNITY): Payer: Medicare Other

## 2018-11-15 ENCOUNTER — Telehealth: Payer: Self-pay | Admitting: *Deleted

## 2018-11-15 DIAGNOSIS — I10 Essential (primary) hypertension: Secondary | ICD-10-CM | POA: Diagnosis not present

## 2018-11-15 DIAGNOSIS — Z23 Encounter for immunization: Secondary | ICD-10-CM | POA: Diagnosis not present

## 2018-11-15 DIAGNOSIS — E039 Hypothyroidism, unspecified: Secondary | ICD-10-CM | POA: Insufficient documentation

## 2018-11-15 DIAGNOSIS — S0101XA Laceration without foreign body of scalp, initial encounter: Secondary | ICD-10-CM | POA: Insufficient documentation

## 2018-11-15 DIAGNOSIS — N183 Chronic kidney disease, stage 3 (moderate): Secondary | ICD-10-CM | POA: Insufficient documentation

## 2018-11-15 DIAGNOSIS — S199XXA Unspecified injury of neck, initial encounter: Secondary | ICD-10-CM | POA: Diagnosis not present

## 2018-11-15 DIAGNOSIS — Z794 Long term (current) use of insulin: Secondary | ICD-10-CM | POA: Diagnosis not present

## 2018-11-15 DIAGNOSIS — Z79899 Other long term (current) drug therapy: Secondary | ICD-10-CM | POA: Insufficient documentation

## 2018-11-15 DIAGNOSIS — I5032 Chronic diastolic (congestive) heart failure: Secondary | ICD-10-CM | POA: Insufficient documentation

## 2018-11-15 DIAGNOSIS — R000 Tachycardia, unspecified: Secondary | ICD-10-CM | POA: Diagnosis not present

## 2018-11-15 DIAGNOSIS — S0003XA Contusion of scalp, initial encounter: Secondary | ICD-10-CM | POA: Diagnosis not present

## 2018-11-15 DIAGNOSIS — Y999 Unspecified external cause status: Secondary | ICD-10-CM | POA: Diagnosis not present

## 2018-11-15 DIAGNOSIS — Y9389 Activity, other specified: Secondary | ICD-10-CM | POA: Insufficient documentation

## 2018-11-15 DIAGNOSIS — W01198A Fall on same level from slipping, tripping and stumbling with subsequent striking against other object, initial encounter: Secondary | ICD-10-CM | POA: Insufficient documentation

## 2018-11-15 DIAGNOSIS — Y92009 Unspecified place in unspecified non-institutional (private) residence as the place of occurrence of the external cause: Secondary | ICD-10-CM | POA: Insufficient documentation

## 2018-11-15 DIAGNOSIS — E1122 Type 2 diabetes mellitus with diabetic chronic kidney disease: Secondary | ICD-10-CM | POA: Insufficient documentation

## 2018-11-15 DIAGNOSIS — I130 Hypertensive heart and chronic kidney disease with heart failure and stage 1 through stage 4 chronic kidney disease, or unspecified chronic kidney disease: Secondary | ICD-10-CM | POA: Insufficient documentation

## 2018-11-15 DIAGNOSIS — W19XXXA Unspecified fall, initial encounter: Secondary | ICD-10-CM

## 2018-11-15 DIAGNOSIS — R001 Bradycardia, unspecified: Secondary | ICD-10-CM | POA: Diagnosis not present

## 2018-11-15 DIAGNOSIS — I4891 Unspecified atrial fibrillation: Secondary | ICD-10-CM | POA: Diagnosis not present

## 2018-11-15 DIAGNOSIS — S0990XA Unspecified injury of head, initial encounter: Secondary | ICD-10-CM | POA: Diagnosis not present

## 2018-11-15 DIAGNOSIS — R52 Pain, unspecified: Secondary | ICD-10-CM | POA: Diagnosis not present

## 2018-11-15 DIAGNOSIS — Z7901 Long term (current) use of anticoagulants: Secondary | ICD-10-CM | POA: Insufficient documentation

## 2018-11-15 MED ORDER — TETANUS-DIPHTH-ACELL PERTUSSIS 5-2.5-18.5 LF-MCG/0.5 IM SUSP
0.5000 mL | Freq: Once | INTRAMUSCULAR | Status: AC
  Administered 2018-11-15: 0.5 mL via INTRAMUSCULAR
  Filled 2018-11-15: qty 0.5

## 2018-11-15 NOTE — Telephone Encounter (Signed)
[phone] phone to check on after fall. No answer will try again.

## 2018-11-15 NOTE — ED Notes (Signed)
Patient transported to CT 

## 2018-11-15 NOTE — ED Provider Notes (Signed)
[facility] EMERGENCY DEPARTMENT Provider Note   CSN: [id number] Arrival date & time: 11/15/18  0428    History   Chief Complaint Chief Complaint  Patient presents with   Head Injury    HPI Summer Hawkins is a 84 y.o. female.   The history is provided by the patient.  Head Injury She has history of diabetes, hypertension, hyperlipidemia, chronic kidney disease, breast cancer, atrial fibrillation anticoagulated on apixaban and is brought in by ambulance following head injury at home.  She woke up to go to the bathroom and stumbled and hit her head on a closet door.  She denies loss of consciousness.  She does not know when her last tetanus immunization was.  She denies other injury.  Past Medical History:  Diagnosis Date   Atrial fibrillation ([place])    Breast cancer ([place])    Breast cancer, left ([place])    Chronic kidney disease    stage III - patient was unaware   Diabetes mellitus    Dyspnea    Dysrhythmia    Afib   H/O: hysterectomy    History of colonoscopy 01/25/2010   History of mammogram 08/04/2009   Hyperlipidemia    Hypertension    Hypothyroidism    Ketoacidosis, diabetic, no coma, non-insulin dependent    Type II   Vitamin B12 deficiency     Patient Active Problem List   Diagnosis Date Noted   Bradycardia 09/25/2017   Permanent atrial fibrillation    Chronic diastolic CHF (congestive heart failure) ([place]) 08/08/2017   Essential hypertension 08/08/2017   Breast cancer of upper-outer quadrant of left female breast ([place]) 01/05/2016   Chronic atrial fibrillation 01/05/2011    Past Surgical History:  Procedure Laterality Date   ABDOMINAL HYSTERECTOMY     Bilateral foot surgery     BREAST EXCISIONAL BIOPSY     BREAST LUMPECTOMY Left    2017   BREAST LUMPECTOMY WITH RADIOACTIVE SEED AND SENTINEL LYMPH NODE BIOPSY Left 01/19/2016   Procedure: LEFT BREAST LUMPECTOMY WITH RADIOACTIVE SEED AND SENTINEL LYMPH NODE  BIOPSY;  Surgeon: Autumn Messing III, MD;  Location: [facility];  Service: General;  Laterality: Left;   BREAST LUMPECTOMY WITH RADIOACTIVE SEED LOCALIZATION Left 01/19/2016   CARDIOVERSION  02/27/2011   Procedure: CARDIOVERSION;  Surgeon: Loralie Champagne, MD;  Location: [facility];  Service: Cardiovascular;  Laterality: N/A;   LAPAROSCOPIC CHOLECYSTECTOMY     ROTATOR CUFF REPAIR Left    TUBAL LIGATION       OB History   No obstetric history on file.      Home Medications    Prior to Admission medications   Medication Sig Start Date End Date Taking? Authorizing Provider  amLODipine (NORVASC) 5 MG tablet TAKE 1 TABLET(5 MG) BY MOUTH TWICE DAILY 03/07/18   Bhagat, Bhavinkumar, PA  anastrozole (ARIMIDEX) 1 MG tablet TAKE 1 TABLET(1 MG) BY MOUTH DAILY 11/20/17   Nicholas Lose, MD  apixaban (ELIQUIS) 5 MG TABS tablet Take 1 tablet (5 mg total) by mouth 2 (two) times daily. 11/16/16   Nicholas Lose, MD  BD PEN NEEDLE NANO U/F 32G X 4 MM MISC USE WITH INSULIN PEN QD 03/27/18   [provider]  ferrous sulfate 325 (65 FE) MG tablet Take 325 mg by mouth daily with breakfast.      [provider]  furosemide (LASIX) 20 MG tablet Take one tablet by mouth daily as needed for swelling or weight gain 01/02/18   Burnell Blanks,  MD  glimepiride (AMARYL) 2 MG tablet Take 2 mg by mouth daily with breakfast.  09/11/11   [provider]  levothyroxine (SYNTHROID) 137 MCG tablet Take 137 mcg by mouth daily. 07/02/18   [provider]  losartan (COZAAR) 100 MG tablet Take 100 mg by mouth daily. 07/02/18   [provider]  metFORMIN (GLUCOPHAGE) 850 MG tablet Take 850 mg by mouth 2 (two) times daily with a meal.     [provider]  Multiple Vitamins-Minerals (MULTIVITAMIN WITH MINERALS) tablet Take 1 tablet by mouth daily.      [provider]  ONE TOUCH ULTRA TEST test strip USE TO TEST BLOOD SUGAR ONCE DAILY 04/03/18   [provider]  TOUJEO  SOLOSTAR 300 UNIT/ML SOPN Inject 30 Units into the skin daily.  03/12/15   [provider]    Family History Family History  Problem Relation Age of Onset   Heart failure Father 16       enlarged heart   Pneumonia Mother 23   Diabetes Mother 50   Breast cancer Sister    Cancer Neg Hx     Social History Social History   Tobacco Use   Smoking status: Never Smoker   Smokeless tobacco: Never Used  Substance Use Topics   Alcohol use: No    Alcohol/week: 0.0 standard drinks   Drug use: No     Allergies   Bee venom and Penicillins   Review of Systems Review of Systems  All other systems reviewed and are negative.    Physical Exam Updated Vital Signs BP (!) 176/89    Pulse 77    Temp 98 F (36.7 C) (Oral)    Resp 20    LMP  (LMP Unknown)    SpO2 97%   Physical Exam Vitals signs and nursing note reviewed.    84 year old female, resting comfortably and in no acute distress. Vital signs are significant for elevated blood pressure. Oxygen saturation is 97%, which is normal. Head is normocephalic.  Hematoma and laceration are present in the left parietal area. PERRLA, EOMI. Oropharynx is clear. Neck is immobilized in a stiff cervical collar and is nontender without adenopathy or JVD. Back is nontender and there is no CVA tenderness. Lungs are clear without rales, wheezes, or rhonchi. Chest is nontender. Heart has regular rate and rhythm without murmur. Abdomen is soft, flat, nontender without masses or hepatosplenomegaly and peristalsis is normoactive. Extremities have no cyanosis or edema, full range of motion is present. Skin is warm and dry without rash. Neurologic: Mental status is normal, cranial nerves are intact, there are no motor or sensory deficits.  ED Treatments / Results  Labs (all labs ordered are listed, but only abnormal results are displayed) Labs Reviewed - No data to display  EKG EKG Interpretation  Date/Time:  Friday November 15 2018 04:32:06 EDT Ventricular Rate:  78 PR Interval:    QRS Duration: 99 QT Interval:  430 QTC Calculation: 426 R Axis:   88 Text Interpretation:  Atrial fibrillation Premature ventricular complexes Low voltage QRS Probable anterior infarct, age indeterminate When compared with ECG of 04/11/2018, No significant change was found Confirmed by Delora Fuel ([id number]) on 11/15/2018 4:38:39 AM   Radiology Ct Head Wo Contrast  Result Date: 11/15/2018 CLINICAL DATA:  [place] [address] and hit head on closet door with hematoma. EXAM: CT HEAD WITHOUT CONTRAST CT CERVICAL SPINE WITHOUT CONTRAST TECHNIQUE: Multidetector CT imaging of the head and cervical spine was performed  following the standard protocol without intravenous contrast. Multiplanar CT image reconstructions of the cervical spine were also generated. COMPARISON:  None. FINDINGS: CT HEAD FINDINGS Brain: There is no evidence of acute infarct, intracranial hemorrhage, mass, midline shift, or extra-axial fluid collection. There is mild cerebral atrophy. Cerebral white matter hypodensities are nonspecific but compatible with chronic small vessel ischemic disease, mild for age. Vascular: Calcified atherosclerosis at the skull base. No hyperdense vessel. Skull: No fracture or focal osseous lesion. Sinuses/Orbits: Trace mucosal thickening in the left maxillary sinus. Clear mastoid air cells. Bilateral cataract extraction. Other: Moderately large left scalp hematoma near the vertex. CT CERVICAL SPINE FINDINGS Alignment: Trace retrolisthesis of C5 on C6. Skull base and vertebrae: No fracture or destructive osseous process. Mild-to-moderate median C1-2 arthropathy. Soft tissues and spinal canal: No prevertebral fluid or swelling. No visible canal hematoma. Disc levels: Multilevel cervical disc degeneration, greatest at C5-6 where there is moderate to severe disc space narrowing and degenerative endplate sclerosis and spurring with disc bulging and uncovertebral spurring  resulting in mild-to-moderate spinal stenosis and moderate bilateral neural foraminal stenosis. Upper chest: Clear lung apices. Other: Mild calcified atherosclerosis involving the carotid arteries. IMPRESSION: 1. No evidence of acute intracranial abnormality. 2. Left scalp hematoma. 3. Mild chronic small vessel ischemic disease and cerebral atrophy. 4. No evidence of acute fracture in the cervical spine. Electronically Signed   By: Logan Bores M.D.   On: 11/15/2018 05:33   Ct Cervical Spine Wo Contrast  Result Date: 11/15/2018 CLINICAL DATA:  [address] and hit head on closet door with hematoma. EXAM: CT HEAD WITHOUT CONTRAST CT CERVICAL SPINE WITHOUT CONTRAST TECHNIQUE: Multidetector CT imaging of the head and cervical spine was performed following the standard protocol without intravenous contrast. Multiplanar CT image reconstructions of the cervical spine were also generated. COMPARISON:  None. FINDINGS: CT HEAD FINDINGS Brain: There is no evidence of acute infarct, intracranial hemorrhage, mass, midline shift, or extra-axial fluid collection. There is mild cerebral atrophy. Cerebral white matter hypodensities are nonspecific but compatible with chronic small vessel ischemic disease, mild for age. Vascular: Calcified atherosclerosis at the skull base. No hyperdense vessel. Skull: No fracture or focal osseous lesion. Sinuses/Orbits: Trace mucosal thickening in the left maxillary sinus. Clear mastoid air cells. Bilateral cataract extraction. Other: Moderately large left scalp hematoma near the vertex. CT CERVICAL SPINE FINDINGS Alignment: Trace retrolisthesis of C5 on C6. Skull base and vertebrae: No fracture or destructive osseous process. Mild-to-moderate median C1-2 arthropathy. Soft tissues and spinal canal: No prevertebral fluid or swelling. No visible canal hematoma. Disc levels: Multilevel cervical disc degeneration, greatest at C5-6 where there is moderate to severe disc space narrowing and degenerative  endplate sclerosis and spurring with disc bulging and uncovertebral spurring resulting in mild-to-moderate spinal stenosis and moderate bilateral neural foraminal stenosis. Upper chest: Clear lung apices. Other: Mild calcified atherosclerosis involving the carotid arteries. IMPRESSION: 1. No evidence of acute intracranial abnormality. 2. Left scalp hematoma. 3. Mild chronic small vessel ischemic disease and cerebral atrophy. 4. No evidence of acute fracture in the cervical spine. Electronically Signed   By: Logan Bores M.D.   On: 11/15/2018 05:33    Procedures .Marland KitchenLaceration Repair  Date/Time: 11/15/2018 5:48 AM Performed by: Delora Fuel, MD Authorized by: Delora Fuel, MD   Consent:    Consent obtained:  Verbal   Consent given by:  Patient   Risks discussed:  Infection, pain and poor wound healing   Alternatives discussed:  No treatment Anesthesia (see MAR for exact dosages):  Anesthesia method:  None Laceration details:    Location:  Scalp   Scalp location:  L parietal   Length (cm):  6   Depth (mm):  5 Repair type:    Repair type:  Simple Pre-procedure details:    Preparation:  Patient was prepped and draped in usual sterile fashion Exploration:    Hemostasis achieved with:  Direct pressure   Wound exploration: entire depth of wound probed and visualized     Wound extent: no foreign bodies/material noted and no underlying fracture noted     Contaminated: no   Treatment:    Area cleansed with:  Saline   Amount of cleaning:  Standard Skin repair:    Repair method:  Staples   Number of staples:  11 Approximation:    Approximation:  Close Post-procedure details:    Dressing:  Open (no dressing)   Patient tolerance of procedure:  Tolerated well, no immediate complications    Medications Ordered in ED Medications  Tdap (BOOSTRIX) injection 0.5 mL (has no administration in time range)     Initial Impression / Assessment and Plan / ED Course  I have reviewed the triage  vital signs and the nursing notes.  Pertinent labs & imaging results that were available during my care of the patient were reviewed by me and considered in my medical decision making (see chart for details).  Fall with head injury and patient was anticoagulated.  She will be sent for CT of head and cervical spine.  Laceration will be closed with staples.  ECG is significant for atrial fibrillation and occasional PVC, unchanged from prior ECG.  Old records are reviewed, and she has a prior ED visit for a fall in January 2020.  CT scans show no intracranial bleeding, no C-spine fracture.  She is discharged with instructions of staples removed in 7-10 days.  Advised of possibility of delayed bleeding given her anticoagulated state.  Final Clinical Impressions(s) / ED Diagnoses   Final diagnoses:  Fall at home, initial encounter  Scalp laceration, initial encounter  Elevated blood pressure reading with diagnosis of hypertension  Chronic anticoagulation    ED Discharge Orders    None       Delora Fuel, MD [id number] [phone]

## 2018-11-15 NOTE — Discharge Instructions (Signed)
Your CT scan did not show signs of any bleeding inside the brain.  However, since you are on a blood thinner, you are at risk for having delayed bleeding.  If there are any problems that occur in the next 1-2 days, return to the emergency department immediately for [place] to reevaluate you.  This is your second fall in less than a year.  Please discuss your falls with the provider who is prescribing your blood thinner.

## 2018-11-15 NOTE — ED Triage Notes (Signed)
Pt comes via [facility] after falling getting up to go to the bathroom, hit head on closet door, large hematoma to front L side, hx of afib, on eliquis.

## 2018-11-20 ENCOUNTER — Telehealth: Payer: Self-pay | Admitting: Cardiovascular Disease

## 2018-11-20 NOTE — Telephone Encounter (Signed)
STAT if HR is under 50 or over 120 (normal HR is 60-100 beats per minute)  1) What is your heart rate? 51 today.   2) Do you have a log of your heart rate readings (document readings)? 9/8 55            9/7 45            9/6 43             3) Do you have any other symptoms? Last Friday 9/4 she passed out and hit her head, she was rushed to the hospital and she had to have 15 stiches on her head.  When she got home she checked her HR she said it was 40. ED notes in Epic, pluse in ED was 42, she states they would not admit her, they told her she had to go home.   Patient states she is feeling very weak.

## 2018-11-20 NOTE — Telephone Encounter (Signed)
I spoke to the patient who is calling because of slow HR, SOB, dizziness and lightheadedness.  She fainted last Friday night and was treated in the ED receiving stitches.  Her BP has been normal 132/79, but HR is 40s-50s and symptomatic.  I scheduled an appointment with Dr Angelena Form 9/10 for f/u.

## 2018-11-21 ENCOUNTER — Encounter: Payer: Self-pay | Admitting: Cardiovascular Disease

## 2018-11-21 ENCOUNTER — Encounter (HOSPITAL_COMMUNITY): Payer: Self-pay

## 2018-11-21 ENCOUNTER — Ambulatory Visit (INDEPENDENT_AMBULATORY_CARE_PROVIDER_SITE_OTHER): Payer: Medicare Other | Admitting: Cardiovascular Disease

## 2018-11-21 ENCOUNTER — Inpatient Hospital Stay (HOSPITAL_COMMUNITY)
Admission: EM | Admit: 2018-11-21 | Discharge: 2018-11-23 | DRG: 291 | Disposition: A | Discharge status: Home / Self Care | Payer: Medicare Other | Source: Ambulatory Visit | Attending: Cardiovascular Disease | Admitting: Cardiovascular Disease

## 2018-11-21 ENCOUNTER — Other Ambulatory Visit: Payer: Self-pay

## 2018-11-21 ENCOUNTER — Telehealth: Payer: Self-pay | Admitting: *Deleted

## 2018-11-21 ENCOUNTER — Emergency Department (HOSPITAL_COMMUNITY): Payer: Medicare Other

## 2018-11-21 VITALS — BP 120/70 | HR 94 | Ht 64.0 in | Wt 230.0 lb

## 2018-11-21 DIAGNOSIS — I4891 Unspecified atrial fibrillation: Secondary | ICD-10-CM | POA: Diagnosis not present

## 2018-11-21 DIAGNOSIS — R0600 Dyspnea, unspecified: Secondary | ICD-10-CM | POA: Diagnosis not present

## 2018-11-21 DIAGNOSIS — E162 Hypoglycemia, unspecified: Secondary | ICD-10-CM | POA: Diagnosis not present

## 2018-11-21 DIAGNOSIS — E538 Deficiency of other specified B group vitamins: Secondary | ICD-10-CM | POA: Diagnosis present

## 2018-11-21 DIAGNOSIS — E1122 Type 2 diabetes mellitus with diabetic chronic kidney disease: Secondary | ICD-10-CM | POA: Diagnosis present

## 2018-11-21 DIAGNOSIS — I5033 Acute on chronic diastolic (congestive) heart failure: Secondary | ICD-10-CM | POA: Diagnosis not present

## 2018-11-21 DIAGNOSIS — Z79811 Long term (current) use of aromatase inhibitors: Secondary | ICD-10-CM

## 2018-11-21 DIAGNOSIS — I4821 Permanent atrial fibrillation: Secondary | ICD-10-CM | POA: Diagnosis not present

## 2018-11-21 DIAGNOSIS — Z9012 Acquired absence of left breast and nipple: Secondary | ICD-10-CM

## 2018-11-21 DIAGNOSIS — Z79890 Hormone replacement therapy (postmenopausal): Secondary | ICD-10-CM

## 2018-11-21 DIAGNOSIS — I130 Hypertensive heart and chronic kidney disease with heart failure and stage 1 through stage 4 chronic kidney disease, or unspecified chronic kidney disease: Secondary | ICD-10-CM | POA: Diagnosis not present

## 2018-11-21 DIAGNOSIS — Z880 Allergy status to penicillin: Secondary | ICD-10-CM | POA: Diagnosis not present

## 2018-11-21 DIAGNOSIS — Z7901 Long term (current) use of anticoagulants: Secondary | ICD-10-CM

## 2018-11-21 DIAGNOSIS — R0602 Shortness of breath: Secondary | ICD-10-CM | POA: Diagnosis not present

## 2018-11-21 DIAGNOSIS — Z7984 Long term (current) use of oral hypoglycemic drugs: Secondary | ICD-10-CM

## 2018-11-21 DIAGNOSIS — I4819 Other persistent atrial fibrillation: Secondary | ICD-10-CM

## 2018-11-21 DIAGNOSIS — N189 Chronic kidney disease, unspecified: Secondary | ICD-10-CM | POA: Diagnosis present

## 2018-11-21 DIAGNOSIS — E161 Other hypoglycemia: Secondary | ICD-10-CM | POA: Diagnosis not present

## 2018-11-21 DIAGNOSIS — E039 Hypothyroidism, unspecified: Secondary | ICD-10-CM | POA: Diagnosis present

## 2018-11-21 DIAGNOSIS — R42 Dizziness and giddiness: Secondary | ICD-10-CM

## 2018-11-21 DIAGNOSIS — Z20828 Contact with and (suspected) exposure to other viral communicable diseases: Secondary | ICD-10-CM | POA: Diagnosis not present

## 2018-11-21 DIAGNOSIS — Z833 Family history of diabetes mellitus: Secondary | ICD-10-CM

## 2018-11-21 DIAGNOSIS — R001 Bradycardia, unspecified: Secondary | ICD-10-CM | POA: Diagnosis present

## 2018-11-21 DIAGNOSIS — Z8249 Family history of ischemic heart disease and other diseases of the circulatory system: Secondary | ICD-10-CM

## 2018-11-21 DIAGNOSIS — R55 Syncope and collapse: Secondary | ICD-10-CM | POA: Diagnosis present

## 2018-11-21 DIAGNOSIS — Z9181 History of falling: Secondary | ICD-10-CM

## 2018-11-21 DIAGNOSIS — Z79899 Other long term (current) drug therapy: Secondary | ICD-10-CM

## 2018-11-21 DIAGNOSIS — Z853 Personal history of malignant neoplasm of breast: Secondary | ICD-10-CM

## 2018-11-21 DIAGNOSIS — E785 Hyperlipidemia, unspecified: Secondary | ICD-10-CM | POA: Diagnosis present

## 2018-11-21 DIAGNOSIS — I5032 Chronic diastolic (congestive) heart failure: Secondary | ICD-10-CM

## 2018-11-21 LAB — CBC WITH DIFFERENTIAL/PLATELET
Abs Immature Granulocytes: 0.03 10*3/uL (ref 0.00–0.07)
Basophils Absolute: 0.1 10*3/uL (ref 0.0–0.1)
Basophils Relative: 1 %
Eosinophils Absolute: 0.3 10*3/uL (ref 0.0–0.5)
Eosinophils Relative: 2 %
HCT: 33.4 % — ABNORMAL LOW (ref 36.0–46.0)
Hemoglobin: 10.4 g/dL — ABNORMAL LOW (ref 12.0–15.0)
Immature Granulocytes: 0 %
Lymphocytes Relative: 23 %
Lymphs Abs: 2.4 10*3/uL (ref 0.7–4.0)
MCH: 30.7 pg (ref 26.0–34.0)
MCHC: 31.1 g/dL (ref 30.0–36.0)
MCV: 98.5 fL (ref 80.0–100.0)
Monocytes Absolute: 1 10*3/uL (ref 0.1–1.0)
Monocytes Relative: 9 %
Neutro Abs: 7 10*3/uL (ref 1.7–7.7)
Neutrophils Relative %: 65 %
Platelets: 279 10*3/uL (ref 150–400)
RBC: 3.39 MIL/uL — ABNORMAL LOW (ref 3.87–5.11)
RDW: 13.1 % (ref 11.5–15.5)
WBC: 10.8 10*3/uL — ABNORMAL HIGH (ref 4.0–10.5)
nRBC: 0 % (ref 0.0–0.2)

## 2018-11-21 LAB — CBG MONITORING, ED
Glucose-Capillary: 111 mg/dL — ABNORMAL HIGH (ref 70–99)
Glucose-Capillary: 89 mg/dL (ref 70–99)

## 2018-11-21 LAB — BASIC METABOLIC PANEL
Anion gap: 12 (ref 5–15)
BUN: 28 mg/dL — ABNORMAL HIGH (ref 8–23)
CO2: 18 mmol/L — ABNORMAL LOW (ref 22–32)
Calcium: 8.8 mg/dL — ABNORMAL LOW (ref 8.9–10.3)
Chloride: 107 mmol/L (ref 98–111)
Creatinine, Ser: 1.51 mg/dL — ABNORMAL HIGH (ref 0.44–1.00)
GFR calc Af Amer: 37 mL/min — ABNORMAL LOW (ref >60)
GFR calc non Af Amer: 32 mL/min — ABNORMAL LOW (ref >60)
Glucose, Bld: 109 mg/dL — ABNORMAL HIGH (ref 70–99)
Potassium: 4 mmol/L (ref 3.5–5.1)
Sodium: 137 mmol/L (ref 135–145)

## 2018-11-21 LAB — BRAIN NATRIURETIC PEPTIDE: B Natriuretic Peptide: 233.5 pg/mL — ABNORMAL HIGH (ref 0.0–100.0)

## 2018-11-21 LAB — PROTIME-INR
INR: 1.4 — ABNORMAL HIGH (ref 0.8–1.2)
Prothrombin Time: 16.9 seconds — ABNORMAL HIGH (ref 11.4–15.2)

## 2018-11-21 LAB — MAGNESIUM: Magnesium: 2 mg/dL (ref 1.7–2.4)

## 2018-11-21 LAB — TSH: TSH: 0.781 u[IU]/mL (ref 0.350–4.500)

## 2018-11-21 MED ORDER — LOSARTAN POTASSIUM 50 MG PO TABS
100.0000 mg | ORAL_TABLET | Freq: Every day | ORAL | Status: DC
  Administered 2018-11-22 – 2018-11-23 (×2): 100 mg via ORAL
  Filled 2018-11-21 (×2): qty 2

## 2018-11-21 MED ORDER — INSULIN ASPART 100 UNIT/ML ~~LOC~~ SOLN: 0.0000 [IU] | Freq: Three times a day (TID) | SUBCUTANEOUS | Status: DC

## 2018-11-21 MED ORDER — SODIUM CHLORIDE 0.9 % IV SOLN: 250.0000 mL | INTRAVENOUS | Status: DC | PRN

## 2018-11-21 MED ORDER — LEVOTHYROXINE SODIUM 25 MCG PO TABS
137.0000 ug | ORAL_TABLET | Freq: Every day | ORAL | Status: DC
  Administered 2018-11-22 – 2018-11-23 (×2): 137 ug via ORAL
  Filled 2018-11-21 (×2): qty 1

## 2018-11-21 MED ORDER — INSULIN GLARGINE (1 UNIT DIAL) 300 UNIT/ML ~~LOC~~ SOPN: 15.0000 [IU] | PEN_INJECTOR | Freq: Two times a day (BID) | SUBCUTANEOUS | Status: DC

## 2018-11-21 MED ORDER — ANASTROZOLE 1 MG PO TABS
1.0000 mg | ORAL_TABLET | Freq: Every day | ORAL | Status: DC
  Administered 2018-11-22 – 2018-11-23 (×2): 1 mg via ORAL
  Filled 2018-11-21 (×2): qty 1

## 2018-11-21 MED ORDER — AMLODIPINE BESYLATE 5 MG PO TABS
5.0000 mg | ORAL_TABLET | Freq: Two times a day (BID) | ORAL | Status: DC
  Administered 2018-11-21 – 2018-11-23 (×4): 5 mg via ORAL
  Filled 2018-11-21 (×4): qty 1

## 2018-11-21 MED ORDER — SODIUM CHLORIDE 0.9% FLUSH
3.0000 mL | Freq: Two times a day (BID) | INTRAVENOUS | Status: DC
  Administered 2018-11-21 – 2018-11-23 (×4): 3 mL via INTRAVENOUS

## 2018-11-21 MED ORDER — ZOLPIDEM TARTRATE 5 MG PO TABS: 5.0000 mg | ORAL_TABLET | Freq: Every evening | ORAL | Status: DC | PRN

## 2018-11-21 MED ORDER — APIXABAN 5 MG PO TABS
5.0000 mg | ORAL_TABLET | Freq: Two times a day (BID) | ORAL | Status: DC
  Administered 2018-11-21 – 2018-11-23 (×4): 5 mg via ORAL
  Filled 2018-11-21 (×4): qty 1

## 2018-11-21 MED ORDER — NITROGLYCERIN 0.4 MG SL SUBL: 0.4000 mg | SUBLINGUAL_TABLET | SUBLINGUAL | Status: DC | PRN

## 2018-11-21 MED ORDER — MULTI-VITAMIN/MINERALS PO TABS: 1.0000 | ORAL_TABLET | Freq: Every day | ORAL | Status: DC

## 2018-11-21 MED ORDER — ADULT MULTIVITAMIN W/MINERALS CH
1.0000 | ORAL_TABLET | Freq: Every day | ORAL | Status: DC
  Administered 2018-11-22 – 2018-11-23 (×2): 1 via ORAL
  Filled 2018-11-21 (×2): qty 1

## 2018-11-21 MED ORDER — INSULIN GLARGINE 100 UNIT/ML ~~LOC~~ SOLN
15.0000 [IU] | Freq: Two times a day (BID) | SUBCUTANEOUS | Status: DC
  Administered 2018-11-21 – 2018-11-23 (×4): 15 [IU] via SUBCUTANEOUS
  Filled 2018-11-21 (×6): qty 0.15

## 2018-11-21 MED ORDER — SODIUM CHLORIDE 0.9% FLUSH: 3.0000 mL | INTRAVENOUS | Status: DC | PRN

## 2018-11-21 MED ORDER — FUROSEMIDE 10 MG/ML IJ SOLN
40.0000 mg | Freq: Two times a day (BID) | INTRAMUSCULAR | Status: DC
  Administered 2018-11-22 – 2018-11-23 (×3): 40 mg via INTRAVENOUS
  Filled 2018-11-21 (×3): qty 4

## 2018-11-21 MED ORDER — ACETAMINOPHEN 325 MG PO TABS: 650.0000 mg | ORAL_TABLET | ORAL | Status: DC | PRN

## 2018-11-21 MED ORDER — ONDANSETRON HCL 4 MG/2ML IJ SOLN: 4.0000 mg | Freq: Four times a day (QID) | INTRAMUSCULAR | Status: DC | PRN

## 2018-11-21 MED ORDER — ALPRAZOLAM 0.25 MG PO TABS: 0.2500 mg | ORAL_TABLET | Freq: Two times a day (BID) | ORAL | Status: DC | PRN

## 2018-11-21 MED ORDER — DIPHENHYDRAMINE-APAP (SLEEP) 25-500 MG PO TABS: 1.0000 | ORAL_TABLET | Freq: Every evening | ORAL | Status: DC | PRN

## 2018-11-21 NOTE — ED Triage Notes (Signed)
Pt BIB [facility] after having a syncopal event at her cardiologists office. Per patient she is in a-fib all the time and her heart rate varies from 40's to 120's due to her a-fib. Is on eliquis, with a recent fall last week requiring her to have staples placed due to a large laceration on her head. Pt is alert and oriented x4 at present. Husband at bedside.

## 2018-11-21 NOTE — ED Notes (Signed)
Pt given graham crackers and cranberry juice per RN.

## 2018-11-21 NOTE — Progress Notes (Signed)
Chief Complaint  Patient presents with  . Follow-up    History of Present Illness: 84 yo female with with history of persistent atrial fibrillation, bradycardia, DM, hypothyroidism, HTN and hyperlipidemia today for cardiac followup. She was found to have atrial fibrillation in 2012. She had no awareness of palpitations. No stress induced ischemia on nuclear study. Low risk study. Normal LVEF. I started Toprol XL and Pradaxa. She has since been changed to Eliquis. Echo on 01/12/11 with normal LV size and function, LVEF of 55-60%, mild LVH, moderate TR, mild MR. She was cardioverted on 02/27/11. She was seen in primary care in May 2019 and  dyspnea and weakness. HR was in the 40s on 06/22/17 with atrial fib. Her Toprol dose was reduced to 25 mg daily. She was felt to be volume overloaded and was started on Lasix 20 mg daily. I saw her in our office 07/13/17 and her heart rate was improved. Her symptoms had resolved. 48 hour cardiac monitor May 2019 showed atrial fib with lowest heart rate 57 bpm, PVCs. Echo May 2019 with LvEF=55-60%, no wall motion abnormalities. No significant valve disease. She was admitted to [facility] July 16-18, 2019 with symptomatic bradycardia and decompensated CHF. Her heart rate was in the 40s. Her beta blocker was stopped and her heart rate improved. She was diuresed with IV Lasix. Norvasc added due to uncontrolled HTN. She was seen in EP clinic November 2019 by Dr. Lennie Odor and he did not feel that a pacemaker was indicated. Event monitor November 2019 with no high grade AV block.   She is here today for follow up. She called our office yesterday and reported dyspnea, dizziness, weakness. ED visit 11/15/18 after a syncopal event. Staples placed in scalp laceration. EKG with atrial fib. No labs drawn. She has continued to feel weak and dizzy over the past week. She had a near syncopal event while being examined in our office today. Orthostatics here in the office with fall of SBP from  126/72 down to 110/64 with standing. Heart rate increased from 73bpm to 114 bpm with standing. Blood sugar 44 after near syncopal event.   Primary Care Physician: Summer Pao, MD  Past Medical History:  Diagnosis Date  . Atrial fibrillation ([place])   . Breast cancer ([place])   . Breast cancer, left ([place])   . Chronic kidney disease    stage III - patient was unaware  . Diabetes mellitus   . Dyspnea   . Dysrhythmia    Afib  . H/O: hysterectomy   . History of colonoscopy 01/25/2010  . History of mammogram 08/04/2009  . Hyperlipidemia   . Hypertension   . Hypothyroidism   . Ketoacidosis, diabetic, no coma, non-insulin dependent    Type II  . Vitamin B12 deficiency     Past Surgical History:  Procedure Laterality Date  . ABDOMINAL HYSTERECTOMY    . Bilateral foot surgery    . BREAST EXCISIONAL BIOPSY    . BREAST LUMPECTOMY Left    2017  . BREAST LUMPECTOMY WITH RADIOACTIVE SEED AND SENTINEL LYMPH NODE BIOPSY Left 01/19/2016   Procedure: LEFT BREAST LUMPECTOMY WITH RADIOACTIVE SEED AND SENTINEL LYMPH NODE BIOPSY;  Surgeon: Autumn Messing III, MD;  Location: [facility];  Service: General;  Laterality: Left;  . BREAST LUMPECTOMY WITH RADIOACTIVE SEED LOCALIZATION Left 01/19/2016  . CARDIOVERSION  02/27/2011   Procedure: CARDIOVERSION;  Surgeon: Loralie Champagne, MD;  Location: [facility];  Service: Cardiovascular;  Laterality: N/A;  . LAPAROSCOPIC CHOLECYSTECTOMY    .  ROTATOR CUFF REPAIR Left   . TUBAL LIGATION      Current Outpatient Medications  Medication Sig Dispense Refill  . amLODipine (NORVASC) 5 MG tablet TAKE 1 TABLET(5 MG) BY MOUTH TWICE DAILY 60 tablet 11  . anastrozole (ARIMIDEX) 1 MG tablet TAKE 1 TABLET(1 MG) BY MOUTH DAILY 90 tablet 3  . apixaban (ELIQUIS) 5 MG TABS tablet Take 1 tablet (5 mg total) by mouth 2 (two) times daily. 60 tablet   . BD PEN NEEDLE NANO U/F 32G X 4 MM MISC USE WITH INSULIN PEN QD    . ferrous sulfate 325 (65 FE) MG tablet Take 325 mg by mouth daily with  breakfast.      . furosemide (LASIX) 20 MG tablet Take one tablet by mouth daily as needed for swelling or weight gain 20 tablet 3  . glimepiride (AMARYL) 2 MG tablet Take 2 mg by mouth daily with breakfast.     . levothyroxine (SYNTHROID) 137 MCG tablet Take 137 mcg by mouth daily.    Marland Kitchen losartan (COZAAR) 100 MG tablet Take 100 mg by mouth daily.    . metFORMIN (GLUCOPHAGE) 850 MG tablet Take 850 mg by mouth 2 (two) times daily with a meal.     . Multiple Vitamins-Minerals (MULTIVITAMIN WITH MINERALS) tablet Take 1 tablet by mouth daily.      . ONE TOUCH ULTRA TEST test strip USE TO TEST BLOOD SUGAR ONCE DAILY    . TOUJEO SOLOSTAR 300 UNIT/ML SOPN Inject 30 Units into the skin daily.   1   No current facility-administered medications for this visit.     Allergies  Allergen Reactions  . Bee Venom Nausea And Vomiting    Pt feels faint  . Penicillins Anaphylaxis, Hives and Other (See Comments)    Has patient had a PCN reaction causing immediate rash, facial/tongue/throat swelling, SOB or lightheadedness with hypotension: Yes Has patient had a PCN reaction causing severe rash involving mucus membranes or skin necrosis: No Has patient had a PCN reaction that required hospitalization: No Has patient had a PCN reaction occurring within the last 10 years: No If all of the above answers are "NO", then may proceed with Cephalosporin use.     Social History   Socioeconomic History  . Marital status: Married    Spouse name: Not on file  . Number of children: 2  . Years of education: Not on file  . Highest education level: Not on file  Occupational History    Employer: OTHER    Comment: Worked at [employer]  . Financial resource strain: Not on file  . Food insecurity    Worry: Not on file    Inability: Not on file  . Transportation needs    Medical: Not on file    Non-medical: Not on file  Tobacco Use  . Smoking status: Never Smoker  . Smokeless tobacco: Never Used   Substance and Sexual Activity  . Alcohol use: No    Alcohol/week: 0.0 standard drinks  . Drug use: No  . Sexual activity: Not on file  Lifestyle  . Physical activity    Days per week: Not on file    Minutes per session: Not on file  . Stress: Not on file  Relationships  . Social Herbalist on phone: Not on file    Gets together: Not on file    Attends religious service: Not on file    Active member of club or  organization: Not on file    Attends meetings of clubs or organizations: Not on file    Relationship status: Not on file  . Intimate partner violence    Fear of current or ex partner: Not on file    Emotionally abused: Not on file    Physically abused: Not on file    Forced sexual activity: Not on file  Other Topics Concern  . Not on file  Social History Narrative   Patient since 73   Husband with prostate cancer   10-siblings-no cancer    Family History  Problem Relation Age of Onset  . Heart failure Father 45       enlarged heart  . Pneumonia Mother 80  . Diabetes Mother 33  . Breast cancer Sister   . Cancer Neg Hx     Review of Systems:  As stated in the HPI and otherwise negative.   BP 120/70   Pulse 94   Ht 5\' 4"  (1.626 m)   Wt 230 lb (104.3 kg)   LMP  (LMP Unknown)   SpO2 98%   BMI 39.48 kg/m   Physical Examination:  General: Well developed, well nourished, NAD  HEENT: OP clear, mucus membranes moist  SKIN: warm, dry. No rashes. Neuro: No focal deficits  Musculoskeletal: Muscle strength 5/5 all ext  Psychiatric: Mood and affect normal  Neck: No JVD, no carotid bruits, no thyromegaly, no lymphadenopathy.  Lungs:Clear bilaterally, no wheezes, rhonci, crackles Cardiovascular: Regular rate and rhythm. No murmurs, gallops or rubs. Abdomen:Soft. Bowel sounds present. Non-tender.  Extremities: 1+ bilateral lower extremity edema. Pulses are 2 + in the bilateral DP/PT.  Echo May 2019: - Left ventricle: The cavity size was normal. Wall  thickness was   increased in a pattern of mild LVH. Systolic function was normal.   The estimated ejection fraction was in the range of 55% to 60%.   Wall motion was normal; there were no regional wall motion   abnormalities. - Mitral valve: Mildly to moderately calcified annulus. Mildly   thickened leaflets . - Left atrium: The atrium was mildly dilated. - Pulmonary arteries: Systolic pressure was mildly increased. PA   peak pressure: 36 mm Hg (S).  EKG:  EKG is ordered today. The ekg ordered today demonstrates Atrial fibrillation, rate 94 bpm. Non-specific T wave abnormalities  Recent Labs: 12/26/2017: TSH 0.055 04/11/2018: Hemoglobin 12.1; Platelets 271 05/15/2018: BUN 26; Creatinine, Ser 1.37; Potassium 4.2; Sodium 140   Lipid Panel No results found for: CHOL, TRIG, HDL, CHOLHDL, VLDL, LDLCALC, LDLDIRECT   Wt Readings from Last 3 Encounters:  11/21/18 230 lb (104.3 kg)  08/26/18 219 lb (99.3 kg)  04/19/18 223 lb 6.4 oz (101.3 kg)     Other studies Reviewed: Additional studies/ records that were reviewed today include: . Review of the above records demonstrates:    Assessment and Plan:   1. Atrial fibrillation, permanent/Bradycardia: She is in atrial fib today. Her heart rate is in the 80-90s. She is now off of beta blockers due to bradycardia. She has been seen in our EP clinic and was not felt to need a pacemaker in November 2019. She is continuing to have episodes of dizziness with near syncope almost every day. Syncopal episode last week and she hit her head sustaining a scalp laceration. She will need cardiac monitoring on telemetry to exclude heart block.   2. HTN: BP is controlled. She is orthostatic today in the office.   3. Mitral valve insufficiency: Trivial by  echo May 2019  4. Chronic diastolic CHF: Weight is up. She has worsened LE edema. She is volume overloaded on exam. Her orthostatic vital signs suggest orthostasis. Will begin diuresis tonight with IV Lasix.    5. Near syncope: She had a near syncopal event while being examined today. Her heart rate was in the 70s and BP was 120/70 just after this event. Blood sugar 44 in just after the event. Syncopal event 6 days ago and seen in the ED but no lab work done that night. Unclear if her events are due to long pauses or orthostatic hypotension.   We will have her taken to the ED by EMS today. She will be admitted to cardiology. Will place on telemetry. Will need a BMET, CBC and TSH. Will not need a troponin. Begin diuresis with IV Lasix. Repeat orthostatic vital signs in the am.   Current medicines are reviewed at length with the patient today.  The patient does not have concerns regarding medicines.  The following changes have been made:  no change  Labs/ tests ordered today include:   No orders of the defined types were placed in this encounter.  Signed, Lauree Chandler, MD 11/21/2018 3:23 PM    [facility] [address], [place], [place]  [address] Phone: [phone]; Fax: [phone]

## 2018-11-21 NOTE — Patient Instructions (Signed)
Please proceed to the [facility] for evaluation of pre-syncope and low blood sugar.

## 2018-11-21 NOTE — H&P (Signed)
Chief Complaint  Patient presents with  . Follow-up    History of Present Illness: 84 yo female with with history of persistent atrial fibrillation, bradycardia, DM, hypothyroidism, HTN and hyperlipidemia today for cardiac followup. She was found to have atrial fibrillation in 2012. She had no awareness of palpitations. No stress induced ischemia on nuclear study. Low risk study. Normal LVEF. I started Toprol XL and Pradaxa. She has since been changed to Eliquis. Echo on 01/12/11 with normal LV size and function, LVEF of 55-60%, mild LVH, moderate TR, mild MR. She was cardioverted on 02/27/11. She was seen in primary care in May 2019 and  dyspnea and weakness. HR was in the 40s on 06/22/17 with atrial fib. Her Toprol dose was reduced to 25 mg daily. She was felt to be volume overloaded and was started on Lasix 20 mg daily. I saw her in our office 07/13/17 and her heart rate was improved. Her symptoms had resolved. 48 hour cardiac monitor May 2019 showed atrial fib with lowest heart rate 57 bpm, PVCs. Echo May 2019 with LvEF=55-60%, no wall motion abnormalities. No significant valve disease. She was admitted to [facility] July 16-18, 2019 with symptomatic bradycardia and decompensated CHF. Her heart rate was in the 40s. Her beta blocker was stopped and her heart rate improved. She was diuresed with IV Lasix. Norvasc added due to uncontrolled HTN. She was seen in EP clinic November 2019 by Dr. Lennie Odor and he did not feel that a pacemaker was indicated. Event monitor November 2019 with no high grade AV block.   She is here today for follow up. She called our office yesterday and reported dyspnea, dizziness, weakness. ED visit 11/15/18 after a syncopal event. Staples placed in scalp laceration. EKG with atrial fib. No labs drawn. She has continued to feel weak and dizzy over the past week. She had a near syncopal event while being examined in our office today. Orthostatics here in the office with fall of SBP  from 126/72 down to 110/64 with standing. Heart rate increased from 73bpm to 114 bpm with standing. Blood sugar 44 after near syncopal event.   Primary Care Physician: Haywood Pao, MD      Past Medical History:  Diagnosis Date  . Atrial fibrillation ([place])   . Breast cancer ([place])   . Breast cancer, left ([place])   . Chronic kidney disease    stage III - patient was unaware  . Diabetes mellitus   . Dyspnea   . Dysrhythmia    Afib  . H/O: hysterectomy   . History of colonoscopy 01/25/2010  . History of mammogram 08/04/2009  . Hyperlipidemia   . Hypertension   . Hypothyroidism   . Ketoacidosis, diabetic, no coma, non-insulin dependent    Type II  . Vitamin B12 deficiency          Past Surgical History:  Procedure Laterality Date  . ABDOMINAL HYSTERECTOMY    . Bilateral foot surgery    . BREAST EXCISIONAL BIOPSY    . BREAST LUMPECTOMY Left    2017  . BREAST LUMPECTOMY WITH RADIOACTIVE SEED AND SENTINEL LYMPH NODE BIOPSY Left 01/19/2016   Procedure: LEFT BREAST LUMPECTOMY WITH RADIOACTIVE SEED AND SENTINEL LYMPH NODE BIOPSY;  Surgeon: Autumn Messing III, MD;  Location: [facility];  Service: General;  Laterality: Left;  . BREAST LUMPECTOMY WITH RADIOACTIVE SEED LOCALIZATION Left 01/19/2016  . CARDIOVERSION  02/27/2011   Procedure: CARDIOVERSION;  Surgeon: Loralie Champagne, MD;  Location: [facility];  Service: Cardiovascular;  Laterality: N/A;  . LAPAROSCOPIC CHOLECYSTECTOMY    . ROTATOR CUFF REPAIR Left   . TUBAL LIGATION            Current Outpatient Medications  Medication Sig Dispense Refill  . amLODipine (NORVASC) 5 MG tablet TAKE 1 TABLET(5 MG) BY MOUTH TWICE DAILY 60 tablet 11  . anastrozole (ARIMIDEX) 1 MG tablet TAKE 1 TABLET(1 MG) BY MOUTH DAILY 90 tablet 3  . apixaban (ELIQUIS) 5 MG TABS tablet Take 1 tablet (5 mg total) by mouth 2 (two) times daily. 60 tablet   . BD PEN NEEDLE NANO U/F 32G X 4 MM MISC USE WITH INSULIN PEN QD    . ferrous  sulfate 325 (65 FE) MG tablet Take 325 mg by mouth daily with breakfast.      . furosemide (LASIX) 20 MG tablet Take one tablet by mouth daily as needed for swelling or weight gain 20 tablet 3  . glimepiride (AMARYL) 2 MG tablet Take 2 mg by mouth daily with breakfast.     . levothyroxine (SYNTHROID) 137 MCG tablet Take 137 mcg by mouth daily.    Marland Kitchen losartan (COZAAR) 100 MG tablet Take 100 mg by mouth daily.    . metFORMIN (GLUCOPHAGE) 850 MG tablet Take 850 mg by mouth 2 (two) times daily with a meal.     . Multiple Vitamins-Minerals (MULTIVITAMIN WITH MINERALS) tablet Take 1 tablet by mouth daily.      . ONE TOUCH ULTRA TEST test strip USE TO TEST BLOOD SUGAR ONCE DAILY    . TOUJEO SOLOSTAR 300 UNIT/ML SOPN Inject 30 Units into the skin daily.   1   No current facility-administered medications for this visit.          Allergies  Allergen Reactions  . Bee Venom Nausea And Vomiting    Pt feels faint  . Penicillins Anaphylaxis, Hives and Other (See Comments)    Has patient had a PCN reaction causing immediate rash, facial/tongue/throat swelling, SOB or lightheadedness with hypotension: Yes Has patient had a PCN reaction causing severe rash involving mucus membranes or skin necrosis: No Has patient had a PCN reaction that required hospitalization: No Has patient had a PCN reaction occurring within the last 10 years: No If all of the above answers are "NO", then may proceed with Cephalosporin use.     Social History        Socioeconomic History  . Marital status: Married    Spouse name: Not on file  . Number of children: 2  . Years of education: Not on file  . Highest education level: Not on file  Occupational History    Employer: OTHER    Comment: Worked at [employer]  . Financial resource strain: Not on file  . Food insecurity    Worry: Not on file    Inability: Not on file  . Transportation needs    Medical: Not on file     Non-medical: Not on file  Tobacco Use  . Smoking status: Never Smoker  . Smokeless tobacco: Never Used  Substance and Sexual Activity  . Alcohol use: No    Alcohol/week: 0.0 standard drinks  . Drug use: No  . Sexual activity: Not on file  Lifestyle  . Physical activity    Days per week: Not on file    Minutes per session: Not on file  . Stress: Not on file  Relationships  . Social connections  Talks on phone: Not on file    Gets together: Not on file    Attends religious service: Not on file    Active member of club or organization: Not on file    Attends meetings of clubs or organizations: Not on file    Relationship status: Not on file  . Intimate partner violence    Fear of current or ex partner: Not on file    Emotionally abused: Not on file    Physically abused: Not on file    Forced sexual activity: Not on file  Other Topics Concern  . Not on file  Social History Narrative   Patient since 75   Husband with prostate cancer   10-siblings-no cancer    Family History  Problem Relation Age of Onset  . Heart failure Father 68       enlarged heart  . Pneumonia Mother 27  . Diabetes Mother 78  . Breast cancer Sister   . Cancer Neg Hx     Review of Systems:  As stated in the HPI and otherwise negative.   BP 120/70   Pulse 94   Ht 5\' 4"  (1.626 m)   Wt 230 lb (104.3 kg)   LMP  (LMP Unknown)   SpO2 98%   BMI 39.48 kg/m   Physical Examination:  General: Well developed, well nourished, NAD  HEENT: OP clear, mucus membranes moist  SKIN: warm, dry. No rashes. Neuro: No focal deficits  Musculoskeletal: Muscle strength 5/5 all ext  Psychiatric: Mood and affect normal  Neck: No JVD, no carotid bruits, no thyromegaly, no lymphadenopathy.  Lungs:Clear bilaterally, no wheezes, rhonci, crackles Cardiovascular: Regular rate and rhythm. No murmurs, gallops or rubs. Abdomen:Soft. Bowel sounds present. Non-tender.  Extremities:  1+ bilateral lower extremity edema. Pulses are 2 + in the bilateral DP/PT.  Echo May 2019: - Left ventricle: The cavity size was normal. Wall thickness was increased in a pattern of mild LVH. Systolic function was normal. The estimated ejection fraction was in the range of 55% to 60%. Wall motion was normal; there were no regional wall motion abnormalities. - Mitral valve: Mildly to moderately calcified annulus. Mildly thickened leaflets . - Left atrium: The atrium was mildly dilated. - Pulmonary arteries: Systolic pressure was mildly increased. PA peak pressure: 36 mm Hg (S).  EKG:  EKG is ordered today. The ekg ordered today demonstrates Atrial fibrillation, rate 94 bpm. Non-specific T wave abnormalities  Recent Labs: 12/26/2017: TSH 0.055 04/11/2018: Hemoglobin 12.1; Platelets 271 05/15/2018: BUN 26; Creatinine, Ser 1.37; Potassium 4.2; Sodium 140   Lipid Panel Labs (Brief)  No results found for: CHOL, TRIG, HDL, CHOLHDL, VLDL, LDLCALC, LDLDIRECT        Wt Readings from Last 3 Encounters:  11/21/18 230 lb (104.3 kg)  08/26/18 219 lb (99.3 kg)  04/19/18 223 lb 6.4 oz (101.3 kg)     Other studies Reviewed: Additional studies/ records that were reviewed today include: . Review of the above records demonstrates:    Assessment and Plan:   1. Atrial fibrillation, permanent/Bradycardia: She is in atrial fib today. Her heart rate is in the 80-90s. She is now off of beta blockers due to bradycardia. She has been seen in our EP clinic and was not felt to need a pacemaker in November 2019. She is continuing to have episodes of dizziness with near syncope almost every day. Syncopal episode last week and she hit her head sustaining a scalp laceration. She will need cardiac monitoring on  telemetry to exclude heart block.   2. HTN: BP is controlled. She is orthostatic today in the office.   3. Mitral valve insufficiency: Trivial by echo May 2019  4. Chronic  diastolic CHF: Weight is up. She has worsened LE edema. She is volume overloaded on exam. Her orthostatic vital signs suggest orthostasis. Will begin diuresis tonight with IV Lasix.   5. Near syncope: She had a near syncopal event while being examined today. Her heart rate was in the 70s and BP was 120/70 just after this event. Blood sugar 44 in just after the event. Syncopal event 6 days ago and seen in the ED but no lab work done that night. Unclear if her events are due to long pauses or orthostatic hypotension.   We will have her taken to the ED by EMS today. She will be admitted to cardiology. Will place on telemetry. Will need a BMET, CBC and TSH. Will not need a troponin. Begin diuresis with IV Lasix. Repeat orthostatic vital signs in the am.   Lauree Chandler 11/21/2018 4:42 PM

## 2018-11-21 NOTE — Telephone Encounter (Signed)
[id number] checking on condition and if needs.  Fell at home this week resulting a laceration of the head-was seen in the ed and sutures were used.  Had some cardiac arrhthymias on [id number] and was seen by her cardiologist.  Request for return call given.

## 2018-11-22 ENCOUNTER — Other Ambulatory Visit: Payer: Self-pay

## 2018-11-22 DIAGNOSIS — Z833 Family history of diabetes mellitus: Secondary | ICD-10-CM | POA: Diagnosis not present

## 2018-11-22 DIAGNOSIS — E039 Hypothyroidism, unspecified: Secondary | ICD-10-CM | POA: Diagnosis present

## 2018-11-22 DIAGNOSIS — I5033 Acute on chronic diastolic (congestive) heart failure: Secondary | ICD-10-CM | POA: Diagnosis present

## 2018-11-22 DIAGNOSIS — N189 Chronic kidney disease, unspecified: Secondary | ICD-10-CM | POA: Diagnosis present

## 2018-11-22 DIAGNOSIS — E1122 Type 2 diabetes mellitus with diabetic chronic kidney disease: Secondary | ICD-10-CM | POA: Diagnosis present

## 2018-11-22 DIAGNOSIS — I4821 Permanent atrial fibrillation: Secondary | ICD-10-CM | POA: Diagnosis present

## 2018-11-22 DIAGNOSIS — Z853 Personal history of malignant neoplasm of breast: Secondary | ICD-10-CM | POA: Diagnosis not present

## 2018-11-22 DIAGNOSIS — I130 Hypertensive heart and chronic kidney disease with heart failure and stage 1 through stage 4 chronic kidney disease, or unspecified chronic kidney disease: Secondary | ICD-10-CM | POA: Diagnosis present

## 2018-11-22 DIAGNOSIS — Z9012 Acquired absence of left breast and nipple: Secondary | ICD-10-CM | POA: Diagnosis not present

## 2018-11-22 DIAGNOSIS — R001 Bradycardia, unspecified: Secondary | ICD-10-CM | POA: Diagnosis present

## 2018-11-22 DIAGNOSIS — Z880 Allergy status to penicillin: Secondary | ICD-10-CM | POA: Diagnosis not present

## 2018-11-22 DIAGNOSIS — E785 Hyperlipidemia, unspecified: Secondary | ICD-10-CM | POA: Diagnosis present

## 2018-11-22 DIAGNOSIS — Z8249 Family history of ischemic heart disease and other diseases of the circulatory system: Secondary | ICD-10-CM | POA: Diagnosis not present

## 2018-11-22 DIAGNOSIS — I4819 Other persistent atrial fibrillation: Secondary | ICD-10-CM | POA: Diagnosis not present

## 2018-11-22 DIAGNOSIS — Z20828 Contact with and (suspected) exposure to other viral communicable diseases: Secondary | ICD-10-CM | POA: Diagnosis present

## 2018-11-22 DIAGNOSIS — R55 Syncope and collapse: Secondary | ICD-10-CM | POA: Diagnosis not present

## 2018-11-22 DIAGNOSIS — Z7984 Long term (current) use of oral hypoglycemic drugs: Secondary | ICD-10-CM | POA: Diagnosis not present

## 2018-11-22 DIAGNOSIS — E538 Deficiency of other specified B group vitamins: Secondary | ICD-10-CM | POA: Diagnosis present

## 2018-11-22 DIAGNOSIS — Z79811 Long term (current) use of aromatase inhibitors: Secondary | ICD-10-CM | POA: Diagnosis not present

## 2018-11-22 DIAGNOSIS — Z9181 History of falling: Secondary | ICD-10-CM | POA: Diagnosis not present

## 2018-11-22 DIAGNOSIS — Z79899 Other long term (current) drug therapy: Secondary | ICD-10-CM | POA: Diagnosis not present

## 2018-11-22 DIAGNOSIS — Z79890 Hormone replacement therapy (postmenopausal): Secondary | ICD-10-CM | POA: Diagnosis not present

## 2018-11-22 DIAGNOSIS — Z7901 Long term (current) use of anticoagulants: Secondary | ICD-10-CM | POA: Diagnosis not present

## 2018-11-22 LAB — BASIC METABOLIC PANEL
Anion gap: 11 (ref 5–15)
BUN: 30 mg/dL — ABNORMAL HIGH (ref 8–23)
CO2: 23 mmol/L (ref 22–32)
Calcium: 9 mg/dL (ref 8.9–10.3)
Chloride: 101 mmol/L (ref 98–111)
Creatinine, Ser: 1.82 mg/dL — ABNORMAL HIGH (ref 0.44–1.00)
GFR calc Af Amer: 29 mL/min — ABNORMAL LOW (ref >60)
GFR calc non Af Amer: 25 mL/min — ABNORMAL LOW (ref >60)
Glucose, Bld: 257 mg/dL — ABNORMAL HIGH (ref 70–99)
Potassium: 4.2 mmol/L (ref 3.5–5.1)
Sodium: 135 mmol/L (ref 135–145)

## 2018-11-22 LAB — GLUCOSE, CAPILLARY
Glucose-Capillary: 117 mg/dL — ABNORMAL HIGH (ref 70–99)
Glucose-Capillary: 95 mg/dL (ref 70–99)
Glucose-Capillary: 186 mg/dL — ABNORMAL HIGH (ref 70–99)
Glucose-Capillary: 176 mg/dL — ABNORMAL HIGH (ref 70–99)
Glucose-Capillary: 239 mg/dL — ABNORMAL HIGH (ref 70–99)

## 2018-11-22 LAB — SARS CORONAVIRUS 2 (TAT 6-24 HRS): SARS Coronavirus 2: NEGATIVE

## 2018-11-22 MED ORDER — INSULIN ASPART 100 UNIT/ML ~~LOC~~ SOLN
0.0000 [IU] | Freq: Three times a day (TID) | SUBCUTANEOUS | Status: DC
  Administered 2018-11-22 (×2): 3 [IU] via SUBCUTANEOUS

## 2018-11-22 NOTE — Plan of Care (Signed)
  Problem: Education: Goal: Knowledge of General Education information will improve Description Including pain rating scale, medication(s)/side effects and non-pharmacologic comfort measures Outcome: Progressing   

## 2018-11-22 NOTE — Progress Notes (Addendum)
Progress Note  Patient Name: Summer Hawkins Date of Encounter: 11/22/2018  Primary Cardiologist: Lauree Chandler, MD   Subjective   Patient is feeling well this AM. She has not felt dizzy or lightheaded. No recurrent syncope. Denies chest pain. She is still fluid overloaded.   Inpatient Medications    Scheduled Meds: . amLODipine  5 mg Oral BID  . anastrozole  1 mg Oral Daily  . apixaban  5 mg Oral BID  . furosemide  40 mg Intravenous BID  . insulin aspart  0-15 Units Subcutaneous TID WC  . insulin glargine  15 Units Subcutaneous BID AC & HS  . levothyroxine  137 mcg Oral QAC breakfast  . losartan  100 mg Oral Daily  . multivitamin with minerals  1 tablet Oral Daily  . sodium chloride flush  3 mL Intravenous Q12H   Continuous Infusions: . sodium chloride     PRN Meds: sodium chloride, acetaminophen, ALPRAZolam, nitroGLYCERIN, ondansetron (ZOFRAN) IV, sodium chloride flush, zolpidem   Vital Signs    Vitals:   11/22/18 0000 11/22/18 0529 11/22/18 0807 11/22/18 0917  BP:  139/84 (!) 166/62 (!) 146/59  Pulse:  82 80 (!) 51  Resp:  18 18   Temp:  98.4 F (36.9 C) 98.1 F (36.7 C)   TempSrc:  Oral Oral   SpO2:  98% 98%   Weight: 103.8 kg 104 kg    Height: 5\' 4"  (1.626 m)       Intake/Output Summary (Last 24 hours) at 11/22/2018 0954 Last data filed at 11/22/2018 0952 Gross per 24 hour  Intake 240 ml  Output 1275 ml  Net -1035 ml   Last 3 Weights 11/22/2018 11/22/2018 11/21/2018  Weight (lbs) 229 lb 3.2 oz 228 lb 12.8 oz 230 lb  Weight (kg) 103.964 kg 103.783 kg 104.327 kg      Telemetry    Afib, rates fluctuating. Overnight rates were 70-80s this AM she is 40-50s; occasional PVCs - Personally Reviewed  ECG    Afib, 47 bpm, possible RBBB QRS 136 ms, QtC 520 - Personally Reviewed  Physical Exam   GEN: No acute distress.  Head: laceration with staples  Neck: No JVD Cardiac: RRR, no murmurs, rubs, or gallops.  Respiratory:CTA bilaterally. GI: Soft,  nontender, non-distended  MS: 1+ pedal edema; No deformity. Neuro:  Nonfocal  Psych: Normal affect   Labs    High Sensitivity Troponin:  No results for input(s): TROPONINIHS in the last 720 hours.    Chemistry Recent Labs  Lab 11/21/18 1838  NA 137  K 4.0  CL 107  CO2 18*  GLUCOSE 109*  BUN 28*  CREATININE 1.51*  CALCIUM 8.8*  GFRNONAA 32*  GFRAA 37*  ANIONGAP 12     Hematology Recent Labs  Lab 11/21/18 1838  WBC 10.8*  RBC 3.39*  HGB 10.4*  HCT 33.4*  MCV 98.5  MCH 30.7  MCHC 31.1  RDW 13.1  PLT 279    BNP Recent Labs  Lab 11/21/18 1841  BNP 233.5*     DDimer No results for input(s): DDIMER in the last 168 hours.   Radiology    Dg Chest Port 1 View  Result Date: 11/21/2018 CLINICAL DATA:  Shortness of breath and syncope. EXAM: PORTABLE CHEST 1 VIEW COMPARISON:  04/11/2018 FINDINGS: Mild cardiomegaly. No focal airspace consolidation or pulmonary edema. No pleural effusion or pneumothorax. IMPRESSION: No active cardiopulmonary disease. Mild cardiomegaly. Electronically Signed   By: Ulyses Jarred M.D.   On:  11/21/2018 19:21    Cardiac Studies   Echo 07/27/18 Study Conclusions - Left ventricle: The cavity size was normal. Wall thickness was   increased in a pattern of mild LVH. Systolic function was normal.   The estimated ejection fraction was in the range of 55% to 60%.   Wall motion was normal; there were no regional wall motion   abnormalities. - Mitral valve: Mildly to moderately calcified annulus. Mildly   thickened leaflets . - Left atrium: The atrium was mildly dilated. - Pulmonary arteries: Systolic pressure was mildly increased. PA   peak pressure: 36 mm Hg (S).  Patient Profile     84 y.o. female with with history of persistent atrial fibrillation, bradycardia, DM, hypothyroidism, HTN and hyperlipidemia admitted from the office for dizziness and syncope for cardiac monitoring.   Assessment & Plan    Near syncope Patient went to the  ED last week for a syncopal event but no labs were drawn. She had another near syncopal event will in the cardiology office yesterday during examination. Her heart rate was in the 70s and BP was 120/70 just after this event. Blood sugar 44 in just after the event. Patient was noted to be orthostatic in the office. Holter monitor 2019 showed Atrial fibrillation during monitoring period. No heart rate below 57 bpm with PVCs. She had been seen in our EP clinic and was not felt to need a pacemaker in November 2019. - Unsure reason for syncope ? - Patient is permanently in afib with fluctuating rates. On tele at night patient was 70-80s and around 8:30 AM she dropped to 40-50s. - She denies lightheadedness of dizziness or recurrence of syncope - Recheck orthostatics this AM - BG was 44 yesterday in the office. Patient reports this is rare for her. BG normally ranges 90-140 in the AM.  - BP have been slightly elevated>>will likely improve with further diuresis. - Not on BB for bradycardia - Continue cardiac monitoring   Atrial fibrillation, permanent/Bradycardia Patient was seen in the cardiology office yesterday and reported daily dizziness and syncope for the past week. She has a history of afib with rates down to the 40s and 50s., but not a candidate for Ppm in 2019.  - Patient permanently in afib with fluctuating rates - TSH 0.781 - K+ 4.0, Mag 2.0 - Patient has not had recurrent syncope and no pre-syncopal symptoms - Continue Eliquis  Chronic diastolic CHF Patient noted to be volume overloaded on exam. SOB and swelling worsening over the last 2 weeks. At home patient takes lasix as needed. She reports her baseline weight is 218 lbs. - EF 55-60% on echo 07/2017 - BNP on admission 233 - Creatinine 1.51 on admission. Was 1.37 in March 2020 - Patient was started on IV lasix 40 mg BID. - Volume status -1L - Admission weight 228 lbs.  - Daily weights, Strict I&Os - not on BB due to braydcardia -  Continue Losartan  - BMET daily  HTN - Bps slightly elevated >>suspect will improve with diuresis - Orthostatic yesterday, will recheck - Continue amlodipine 5 mg and Losartan 100 mg daily  Mitral Valve Insufficiency - Trivial in Echo May 2019  For questions or updates, please contact [place] Please consult [email or web address] for contact info under        Signed, Cadence Ninfa Meeker, PA-C  11/22/2018, 9:54 AM    Personally seen and examined. Agree with above.  Looks reasonable today. No further attack.   Unexplained syncope  -  HR 40, but asymptomatic.   - Drop-like attack in the office, witnessed. Glucose 40's. At home fell, sutures.   - Has seen EP, in the past with determination at the time of no pacemaker needed.   Diastolic HF  - IV lasix.   Candee Furbish, MD

## 2018-11-23 LAB — HEMOGLOBIN A1C
Hgb A1c MFr Bld: 6.9 % — ABNORMAL HIGH (ref 4.8–5.6)
Mean Plasma Glucose: 151 mg/dL

## 2018-11-23 LAB — GLUCOSE, CAPILLARY
Glucose-Capillary: 107 mg/dL — ABNORMAL HIGH (ref 70–99)
Glucose-Capillary: 117 mg/dL — ABNORMAL HIGH (ref 70–99)
Glucose-Capillary: 191 mg/dL — ABNORMAL HIGH (ref 70–99)

## 2018-11-23 NOTE — Progress Notes (Signed)
Nsg Discharge Note  Admit Date:  11/21/2018 Discharge date: 11/23/2018   [place] to be dc per MD order.  AVS completed.   Patient/caregiver able to verbalize understanding.  Discharge Medication: Allergies as of 11/23/2018      Reactions   Bee Venom Shortness Of Breath, Nausea And Vomiting, Other (See Comments)   Makes the patient feel faint, also   Penicillins Anaphylaxis, Hives, Swelling, Other (See Comments)   Has patient had a PCN reaction causing immediate rash, facial/tongue/throat swelling, SOB or lightheadedness with hypotension: Yes Has patient had a PCN reaction causing severe rash involving mucus membranes or skin necrosis: No Has patient had a PCN reaction that required hospitalization: No Has patient had a PCN reaction occurring within the last 10 years: No If all of the above answers are "NO", then may proceed with Cephalosporin use.      Medication List    TAKE these medications   amLODipine 5 MG tablet Commonly known as: NORVASC TAKE 1 TABLET(5 MG) BY MOUTH TWICE DAILY What changed: See the new instructions.   anastrozole 1 MG tablet Commonly known as: ARIMIDEX TAKE 1 TABLET(1 MG) BY MOUTH DAILY What changed:   how much to take  how to take this  when to take this  additional instructions   apixaban 5 MG Tabs tablet Commonly known as: Eliquis Take 1 tablet (5 mg total) by mouth 2 (two) times daily.   BD Pen Needle Nano U/F 32G X 4 MM Misc Generic drug: Insulin Pen Needle daily.   diphenhydramine-acetaminophen 25-500 MG Tabs tablet Commonly known as: TYLENOL PM Take 1 tablet by mouth at bedtime as needed (for sleep).   ferrous sulfate 325 (65 FE) MG tablet Take 325 mg by mouth daily with breakfast.   furosemide 20 MG tablet Commonly known as: LASIX Take one tablet by mouth daily as needed for swelling or weight gain What changed:   how much to take  how to take this  when to take this  additional instructions   glimepiride 2 MG  tablet Commonly known as: AMARYL Take 2 mg by mouth daily with breakfast.   levothyroxine 137 MCG tablet Commonly known as: SYNTHROID Take 137 mcg by mouth daily.   losartan 100 MG tablet Commonly known as: COZAAR Take 100 mg by mouth daily.   metFORMIN 850 MG tablet Commonly known as: GLUCOPHAGE Take 850 mg by mouth 2 (two) times daily with a meal.   multivitamin with minerals tablet Take 1 tablet by mouth daily.   naproxen sodium 220 MG tablet Commonly known as: ALEVE Take 220 mg by mouth 2 (two) times daily as needed (for pain or headaches).   ONE TOUCH ULTRA TEST test strip Generic drug: glucose blood daily.   Toujeo SoloStar 300 UNIT/ML Sopn Generic drug: Insulin Glargine (1 Unit Dial) Inject 15 Units into the skin See admin instructions. Inject 15 units into the skin in the morning before breakfast and 15 units at bedtime       Discharge Assessment: Vitals:   11/22/18 1938 11/23/18 0431  BP: (!) 147/56 135/65  Pulse: (!) 46 (!) 45  Resp: 18 18  Temp: 98.7 F (37.1 C) 98.7 F (37.1 C)  SpO2: 99% 97%   Skin clean, dry and intact without evidence of skin break down, no evidence of skin tears noted. IV catheter discontinued intact. Site without signs and symptoms of complications - no redness or edema noted at insertion site, patient denies c/o pain - only slight  tenderness at site.  Dressing with slight pressure applied.  D/c Instructions-Education: Discharge instructions given to patient/family with verbalized understanding. D/c education completed with patient/family including follow up instructions, medication list, d/c activities limitations if indicated, with other d/c instructions as indicated by MD - patient able to verbalize understanding, all questions fully answered. Patient instructed to return to ED, call 911, or call MD for any changes in condition.  Patient escorted via [place], and D/C home via private auto.  Karren Newland, Jolene Schimke, RN 11/23/2018  12:54 PM  Nsg Discharge Note   Discharge Assessment: Vitals:   11/22/18 1938 11/23/18 0431  BP: (!) 147/56 135/65  Pulse: (!) 46 (!) 45  Resp: 18 18  Temp: 98.7 F (37.1 C) 98.7 F (37.1 C)  SpO2: 99% 97%   Skin clean, dry and intact without evidence of skin break down, no evidence of skin tears noted. IV catheter discontinued intact. Site without signs and symptoms of complications - no redness or edema noted at insertion site, patient denies c/o pain - only slight tenderness at site.  Dressing with slight pressure applied.  D/c Instructions-Education: Discharge instructions given to patient/family with verbalized understanding. D/c education completed with patient/family including follow up instructions, medication list, d/c activities limitations if indicated, with other d/c instructions as indicated by MD - patient able to verbalize understanding, all questions fully answered. Patient instructed to return to ED, call 911, or call MD for any changes in condition.  Patient escorted via [place], and D/C home via private auto.  Lorris Carducci, Jolene Schimke, RN 11/23/2018 12:54 PM

## 2018-11-23 NOTE — Progress Notes (Signed)
Progress Note  Patient Name: Summer Hawkins Date of Encounter: 11/23/2018  Primary Cardiologist: Lauree Chandler, MD   Subjective   Laying comfortably in chair, no further dizzy spells no chest pain  Inpatient Medications    Scheduled Meds: . amLODipine  5 mg Oral BID  . anastrozole  1 mg Oral Daily  . apixaban  5 mg Oral BID  . furosemide  40 mg Intravenous BID  . insulin aspart  0-15 Units Subcutaneous TID WC  . insulin glargine  15 Units Subcutaneous BID AC & HS  . levothyroxine  137 mcg Oral QAC breakfast  . losartan  100 mg Oral Daily  . multivitamin with minerals  1 tablet Oral Daily  . sodium chloride flush  3 mL Intravenous Q12H   Continuous Infusions: . sodium chloride     PRN Meds: sodium chloride, acetaminophen, ALPRAZolam, nitroGLYCERIN, ondansetron (ZOFRAN) IV, sodium chloride flush, zolpidem   Vital Signs    Vitals:   11/22/18 0917 11/22/18 1309 11/22/18 1938 11/23/18 0431  BP: (!) 146/59 (!) 153/61 (!) 147/56 135/65  Pulse: (!) 51 (!) 47 (!) 46 (!) 45  Resp:  20 18 18   Temp:  98.2 F (36.8 C) 98.7 F (37.1 C) 98.7 F (37.1 C)  TempSrc:  Oral Oral Oral  SpO2:  98% 99% 97%  Weight:    103.1 kg  Height:        Intake/Output Summary (Last 24 hours) at 11/23/2018 1054 Last data filed at 11/23/2018 1038 Gross per 24 hour  Intake 1078 ml  Output 2955 ml  Net -1877 ml   Last 3 Weights 11/23/2018 11/22/2018 11/22/2018  Weight (lbs) 227 lb 3.2 oz 229 lb 3.2 oz 228 lb 12.8 oz  Weight (kg) 103.057 kg 103.964 kg 103.783 kg      Telemetry    Heart rate overnight at times in the 40s, atrial fibrillation no significant pauses- Personally Reviewed  ECG    Atrial fibrillation- Personally Reviewed  Physical Exam   GEN: No acute distress.   Neck: No JVD, black eyes bilaterally Cardiac:  Irregularly irregular bradycardic, no murmurs, rubs, or gallops.  Respiratory: Clear to auscultation bilaterally. GI: Soft, nontender, non-distended  MS: No  edema; No deformity. Neuro:  Nonfocal  Psych: Normal affect   Labs    High Sensitivity Troponin:  No results for input(s): TROPONINIHS in the last 720 hours.    Chemistry Recent Labs  Lab 11/21/18 1838 11/22/18 2106  NA 137 135  K 4.0 4.2  CL 107 101  CO2 18* 23  GLUCOSE 109* 257*  BUN 28* 30*  CREATININE 1.51* 1.82*  CALCIUM 8.8* 9.0  GFRNONAA 32* 25*  GFRAA 37* 29*  ANIONGAP 12 11     Hematology Recent Labs  Lab 11/21/18 1838  WBC 10.8*  RBC 3.39*  HGB 10.4*  HCT 33.4*  MCV 98.5  MCH 30.7  MCHC 31.1  RDW 13.1  PLT 279    BNP Recent Labs  Lab 11/21/18 1841  BNP 233.5*     DDimer No results for input(s): DDIMER in the last 168 hours.   Radiology    Dg Chest Port 1 View  Result Date: 11/21/2018 CLINICAL DATA:  Shortness of breath and syncope. EXAM: PORTABLE CHEST 1 VIEW COMPARISON:  04/11/2018 FINDINGS: Mild cardiomegaly. No focal airspace consolidation or pulmonary edema. No pleural effusion or pneumothorax. IMPRESSION: No active cardiopulmonary disease. Mild cardiomegaly. Electronically Signed   By: Ulyses Jarred M.D.   On: 11/21/2018 19:21  Cardiac Studies   EF normal  Patient Profile     84 y.o. female permanent atrial fibrillation with bradycardia diabetes hypothyroidism hypertension occasional hypoglycemia hyperlipidemia admitted from the office after witnessed dizzy episode, possible syncope for further cardiac monitoring.  Assessment & Plan    Dizziness/syncope - Still not quite sure what the ultimate etiology is for her episodes.  Could be disequilibrium?  Could have been hypoglycemia-in the office her blood sugars were 44 just after the event.  Could be transient orthostasis?  She does have known bradycardia however she has been in persistent atrial fibrillation this whole time, no evidence of paroxysmal A. fib with compensatory pauses.  Also despite her bradycardia at times in the 40s, she is asymptomatic with this.  I do not think that  a pacemaker is warranted at this time.  It would not be a bad idea however to have her revisit the EP clinic as she did in November 2019 for further discussion.  She knows to be absolutely careful with ambulation, getting up etc.  Chronic diastolic heart failure - Creatinine has increased slightly from 1.5-1.8.  Was 1.4 in March 2020.  Good overall diuresis with IV Lasix, about 3 L net out. -Continue with home Lasix dose as previously prescribed.  [place] for DC.    For questions or updates, please contact [place] Please consult [email or web address] for contact info under        Signed, Candee Furbish, MD  11/23/2018, 10:54 AM

## 2018-11-23 NOTE — Discharge Summary (Addendum)
Discharge Summary    Patient ID: Summer Hawkins MRN: [id number]; DOB: 30-Apr-1934  Admit date: 11/21/2018 Discharge date: 11/23/2018  Primary Care Provider: Haywood Pao, MD  Primary Cardiologist: Lauree Chandler, MD  Primary Electrophysiologist:  Dr. Curt Bears   Discharge Diagnoses    Active Problems:   Syncope and collapse   Allergies Allergies  Allergen Reactions   Bee Venom Shortness Of Breath, Nausea And Vomiting and Other (See Comments)    Makes the patient feel faint, also   Penicillins Anaphylaxis, Hives, Swelling and Other (See Comments)    Has patient had a PCN reaction causing immediate rash, facial/tongue/throat swelling, SOB or lightheadedness with hypotension: Yes Has patient had a PCN reaction causing severe rash involving mucus membranes or skin necrosis: No Has patient had a PCN reaction that required hospitalization: No Has patient had a PCN reaction occurring within the last 10 years: No If all of the above answers are "NO", then may proceed with Cephalosporin use.     Diagnostic Studies/Procedures    None    History of Present Illness     84yo female with with history of persistent atrial fibrillation, bradycardia, DM, hypothyroidism, HTN and hyperlipidemia. [facility] with symptomatic bradycardia and decompensated CHF. Her heart rate was in the 40s. Her beta blocker was stopped and her heart rate improved. She was diuresed with IV Lasix. Norvasc added due to uncontrolled HTN.She was seen in EP clinic November 2019 by Dr. Lennie Odor and he did not feel that a pacemaker was indicated. Event monitor November 2019 with no high grade AV block. Echo in 2019 showed normal LVEF and no significant valvular dysfunction.   On 11/15/18, she presented to the [facility] ED after a fall at home w/ subequent head injury. Found to have a scalp laceration. CT of head and neck showed no intracranial bleeding and no C-spine fracture. Scalp  laceration was closed w/ stables. EKG showed chronic afib w/ CVR and occasional PVCs. She was discharged home same day.   She had f/u with Dr. Angelena Form on 11/21/18. She reported dyspnea, persistent dizziness and weakness since leaving the ED. Vital signs were checked in clinic and she was found to be a bit orthostatic. She was also noted to be volume overloaded on exam. Her weight was up and she had bilateral LEE. She also had a near syncopal event during office visit. Her HR was in the 70s and BP was 120/70 just after event. Blood sugar was 44 just after event. EMS was called to transport pt to [facility] for admission, treatment of acute CHF and telemetry monitoring to exclude heart block.   Hospital Course     Pt admitted to tele. No adverse arrhthymias were found. She was observed to have some occasional bradycardia w/ HR in the 40s but asymptomatic. No AV block. Her volume and dyspnea improved w/ IV Lasix. No recurrent orthostasis. Her blood sugars were also closely monitored and no recurrent hypoglycemia. She had no recurrent dizziness, syncope/ near syncope.    Still not quite sure what the ultimate etiology is for her episodes.  Could be disequilibrium?  Could have been hypoglycemia-in the office her blood sugars were 44 just after the event.  Could be transient orthostasis?  She does have known bradycardia however she has been in persistent atrial fibrillation this whole time, no evidence of paroxysmal A. fib with compensatory pauses.  Also despite her bradycardia at times in the 40s, she is asymptomatic with this.  MD  did not think that a pacemaker is warranted at this time but did recommend that she f/u with EP in clinic to further discuss.  Pt advised to be absolutely careful with ambulation, getting up etc.  She was last seen and examined by Dr. Marlou Porch and felt stable for d/c home. We will have her f/u with EP and APP on Dr. Camillia Herter team.    Consultants: none   Discharge Vitals Blood pressure  135/65, pulse (!) 45, temperature 98.7 F (37.1 C), temperature source Oral, resp. rate 18, height 5\' 4"  (1.626 m), weight 103.1 kg, SpO2 97 %.  Filed Weights   11/22/18 0000 11/22/18 0529 11/23/18 0431  Weight: 103.8 kg 104 kg 103.1 kg    Labs & Radiologic Studies    CBC Recent Labs    11/21/18 1838  WBC 10.8*  NEUTROABS 7.0  HGB 10.4*  HCT 33.4*  MCV 98.5  PLT 123XX123   Basic Metabolic Panel Recent Labs    11/21/18 1838 11/22/18 2106  NA 137 135  K 4.0 4.2  CL 107 101  CO2 18* 23  GLUCOSE 109* 257*  BUN 28* 30*  CREATININE 1.51* 1.82*  CALCIUM 8.8* 9.0  MG 2.0  --    Liver Function Tests No results for input(s): AST, ALT, ALKPHOS, BILITOT, PROT, ALBUMIN in the last 72 hours. No results for input(s): LIPASE, AMYLASE in the last 72 hours. High Sensitivity Troponin:   No results for input(s): TROPONINIHS in the last 720 hours.  BNP Invalid input(s): POCBNP D-Dimer No results for input(s): DDIMER in the last 72 hours. Hemoglobin A1C Recent Labs    11/21/18 2347  HGBA1C 6.9*   Fasting Lipid Panel No results for input(s): CHOL, HDL, LDLCALC, TRIG, CHOLHDL, LDLDIRECT in the last 72 hours. Thyroid Function Tests Recent Labs    11/21/18 1838  TSH 0.781   _____________  Ct Head Wo Contrast  Result Date: 11/15/2018 CLINICAL DATA:  [place] and hit head on closet door with hematoma. EXAM: CT HEAD WITHOUT CONTRAST CT CERVICAL SPINE WITHOUT CONTRAST TECHNIQUE: Multidetector CT imaging of the head and cervical spine was performed following the standard protocol without intravenous contrast. Multiplanar CT image reconstructions of the cervical spine were also generated. COMPARISON:  None. FINDINGS: CT HEAD FINDINGS Brain: There is no evidence of acute infarct, intracranial hemorrhage, mass, midline shift, or extra-axial fluid collection. There is mild cerebral atrophy. Cerebral white matter hypodensities are nonspecific but compatible with chronic small vessel ischemic disease,  mild for age. Vascular: Calcified atherosclerosis at the skull base. No hyperdense vessel. Skull: No fracture or focal osseous lesion. Sinuses/Orbits: Trace mucosal thickening in the left maxillary sinus. Clear mastoid air cells. Bilateral cataract extraction. Other: Moderately large left scalp hematoma near the vertex. CT CERVICAL SPINE FINDINGS Alignment: Trace retrolisthesis of C5 on C6. Skull base and vertebrae: No fracture or destructive osseous process. Mild-to-moderate median C1-2 arthropathy. Soft tissues and spinal canal: No prevertebral fluid or swelling. No visible canal hematoma. Disc levels: Multilevel cervical disc degeneration, greatest at C5-6 where there is moderate to severe disc space narrowing and degenerative endplate sclerosis and spurring with disc bulging and uncovertebral spurring resulting in mild-to-moderate spinal stenosis and moderate bilateral neural foraminal stenosis. Upper chest: Clear lung apices. Other: Mild calcified atherosclerosis involving the carotid arteries. IMPRESSION: 1. No evidence of acute intracranial abnormality. 2. Left scalp hematoma. 3. Mild chronic small vessel ischemic disease and cerebral atrophy. 4. No evidence of acute fracture in the cervical spine. Electronically Signed   By:  Logan Bores M.D.   On: 11/15/2018 05:33   Ct Cervical Spine Wo Contrast  Result Date: 11/15/2018 CLINICAL DATA:  [place] [address] and hit head on closet door with hematoma. EXAM: CT HEAD WITHOUT CONTRAST CT CERVICAL SPINE WITHOUT CONTRAST TECHNIQUE: Multidetector CT imaging of the head and cervical spine was performed following the standard protocol without intravenous contrast. Multiplanar CT image reconstructions of the cervical spine were also generated. COMPARISON:  None. FINDINGS: CT HEAD FINDINGS Brain: There is no evidence of acute infarct, intracranial hemorrhage, mass, midline shift, or extra-axial fluid collection. There is mild cerebral atrophy. Cerebral white matter hypodensities are  nonspecific but compatible with chronic small vessel ischemic disease, mild for age. Vascular: Calcified atherosclerosis at the skull base. No hyperdense vessel. Skull: No fracture or focal osseous lesion. Sinuses/Orbits: Trace mucosal thickening in the left maxillary sinus. Clear mastoid air cells. Bilateral cataract extraction. Other: Moderately large left scalp hematoma near the vertex. CT CERVICAL SPINE FINDINGS Alignment: Trace retrolisthesis of C5 on C6. Skull base and vertebrae: No fracture or destructive osseous process. Mild-to-moderate median C1-2 arthropathy. Soft tissues and spinal canal: No prevertebral fluid or swelling. No visible canal hematoma. Disc levels: Multilevel cervical disc degeneration, greatest at C5-6 where there is moderate to severe disc space narrowing and degenerative endplate sclerosis and spurring with disc bulging and uncovertebral spurring resulting in mild-to-moderate spinal stenosis and moderate bilateral neural foraminal stenosis. Upper chest: Clear lung apices. Other: Mild calcified atherosclerosis involving the carotid arteries. IMPRESSION: 1. No evidence of acute intracranial abnormality. 2. Left scalp hematoma. 3. Mild chronic small vessel ischemic disease and cerebral atrophy. 4. No evidence of acute fracture in the cervical spine. Electronically Signed   By: Logan Bores M.D.   On: 11/15/2018 05:33   Dg Chest Port 1 View  Result Date: 11/21/2018 CLINICAL DATA:  Shortness of breath and syncope. EXAM: PORTABLE CHEST 1 VIEW COMPARISON:  04/11/2018 FINDINGS: Mild cardiomegaly. No focal airspace consolidation or pulmonary edema. No pleural effusion or pneumothorax. IMPRESSION: No active cardiopulmonary disease. Mild cardiomegaly. Electronically Signed   By: Ulyses Jarred M.D.   On: 11/21/2018 19:21   Disposition   Pt is being discharged home today in good condition.  Follow-up Plans & Appointments    Follow-up Information    Constance Haw, MD Follow up.     Specialty: Cardiology Why: our office will call you with a hospital follow-up visit  Contact information: [place] [phone]          Discharge Instructions    Diet - low sodium heart healthy   Complete by: As directed    Increase activity slowly   Complete by: As directed       Discharge Medications   Allergies as of 11/23/2018      Reactions   Bee Venom Shortness Of Breath, Nausea And Vomiting, Other (See Comments)   Makes the patient feel faint, also   Penicillins Anaphylaxis, Hives, Swelling, Other (See Comments)   Has patient had a PCN reaction causing immediate rash, facial/tongue/throat swelling, SOB or lightheadedness with hypotension: Yes Has patient had a PCN reaction causing severe rash involving mucus membranes or skin necrosis: No Has patient had a PCN reaction that required hospitalization: No Has patient had a PCN reaction occurring within the last 10 years: No If all of the above answers are "NO", then may proceed with Cephalosporin use.      Medication List    TAKE these medications  amLODipine 5 MG tablet Commonly known as: NORVASC TAKE 1 TABLET(5 MG) BY MOUTH TWICE DAILY What changed: See the new instructions.   anastrozole 1 MG tablet Commonly known as: ARIMIDEX TAKE 1 TABLET(1 MG) BY MOUTH DAILY What changed:   how much to take  how to take this  when to take this  additional instructions   apixaban 5 MG Tabs tablet Commonly known as: Eliquis Take 1 tablet (5 mg total) by mouth 2 (two) times daily.   BD Pen Needle Nano U/F 32G X 4 MM Misc Generic drug: Insulin Pen Needle daily.   diphenhydramine-acetaminophen 25-500 MG Tabs tablet Commonly known as: TYLENOL PM Take 1 tablet by mouth at bedtime as needed (for sleep).   ferrous sulfate 325 (65 FE) MG tablet Take 325 mg by mouth daily with breakfast.   furosemide 20 MG tablet Commonly known as: LASIX Take one tablet by mouth daily as  needed for swelling or weight gain What changed:   how much to take  how to take this  when to take this  additional instructions   glimepiride 2 MG tablet Commonly known as: AMARYL Take 2 mg by mouth daily with breakfast.   levothyroxine 137 MCG tablet Commonly known as: SYNTHROID Take 137 mcg by mouth daily.   losartan 100 MG tablet Commonly known as: COZAAR Take 100 mg by mouth daily.   metFORMIN 850 MG tablet Commonly known as: GLUCOPHAGE Take 850 mg by mouth 2 (two) times daily with a meal.   multivitamin with minerals tablet Take 1 tablet by mouth daily.   naproxen sodium 220 MG tablet Commonly known as: ALEVE Take 220 mg by mouth 2 (two) times daily as needed (for pain or headaches).   ONE TOUCH ULTRA TEST test strip Generic drug: glucose blood daily.   Toujeo SoloStar 300 UNIT/ML Sopn Generic drug: Insulin Glargine (1 Unit Dial) Inject 15 Units into the skin See admin instructions. Inject 15 units into the skin in the morning before breakfast and 15 units at bedtime        Acute coronary syndrome (MI, NSTEMI, STEMI, etc) this admission?: No.    Outstanding Labs/Studies   none  Duration of Discharge Encounter   Greater than 30 minutes including physician time.  Signed, Lyda Jester, PA-C 11/23/2018, 11:59 AM  Personally seen and examined. Agree with above.   Primary Cardiologist: Lauree Chandler, MD   Subjective   Laying comfortably in chair, no further dizzy spells no chest pain  Inpatient Medications    Scheduled Meds:  amLODipine  5 mg Oral BID   anastrozole  1 mg Oral Daily   apixaban  5 mg Oral BID   furosemide  40 mg Intravenous BID   insulin aspart  0-15 Units Subcutaneous TID WC   insulin glargine  15 Units Subcutaneous BID AC & HS   levothyroxine  137 mcg Oral QAC breakfast   losartan  100 mg Oral Daily   multivitamin with minerals  1 tablet Oral Daily   sodium chloride flush  3 mL Intravenous Q12H     Continuous Infusions:  sodium chloride     PRN Meds: sodium chloride, acetaminophen, ALPRAZolam, nitroGLYCERIN, ondansetron (ZOFRAN) IV, sodium chloride flush, zolpidem   Vital Signs          Vitals:   11/22/18 0917 11/22/18 1309 11/22/18 1938 11/23/18 0431  BP: (!) 146/59 (!) 153/61 (!) 147/56 135/65  Pulse: (!) 51 (!) 47 (!) 46 (!) 45  Resp:  20 18 18  Temp:  98.2 F (36.8 C) 98.7 F (37.1 C) 98.7 F (37.1 C)  TempSrc:  Oral Oral Oral  SpO2:  98% 99% 97%  Weight:    103.1 kg  Height:        Intake/Output Summary (Last 24 hours) at 11/23/2018 1054 Last data filed at 11/23/2018 1038    Gross per 24 hour  Intake 1078 ml  Output 2955 ml  Net -1877 ml   Last 3 Weights 11/23/2018 11/22/2018 11/22/2018  Weight (lbs) 227 lb 3.2 oz 229 lb 3.2 oz 228 lb 12.8 oz  Weight (kg) 103.057 kg 103.964 kg 103.783 kg      Telemetry    Heart rate overnight at times in the 40s, atrial fibrillation no significant pauses- Personally Reviewed  ECG    Atrial fibrillation- Personally Reviewed  Physical Exam   GEN:No acute distress.   Neck:No JVD, black eyes bilaterally Cardiac: Irregularly irregular bradycardic, no murmurs, rubs, or gallops.  Respiratory:Clear to auscultation bilaterally. [id number], nontender, non-distended  MS:No edema; No deformity. Neuro:Nonfocal  Psych: Normal affect   Labs    High Sensitivity Troponin:   Last Labs   No results for input(s): TROPONINIHS in the last 720 hours.      Chemistry Last Labs       Recent Labs  Lab 11/21/18 1838 11/22/18 2106  NA 137 135  K 4.0 4.2  CL 107 101  CO2 18* 23  GLUCOSE 109* 257*  BUN 28* 30*  CREATININE 1.51* 1.82*  CALCIUM 8.8* 9.0  GFRNONAA 32* 25*  GFRAA 37* 29*  ANIONGAP 12 11       Hematology Last Labs      Recent Labs  Lab 11/21/18 1838  WBC 10.8*  RBC 3.39*  HGB 10.4*  HCT 33.4*  MCV 98.5  MCH 30.7  MCHC 31.1  RDW 13.1  PLT 279      BNP Last Labs       Recent Labs  Lab 11/21/18 1841  BNP 233.5*       DDimer  Last Labs   No results for input(s): DDIMER in the last 168 hours.     Radiology     Imaging Results (Last 48 hours)  Dg Chest Port 1 View  Result Date: 11/21/2018 CLINICAL DATA:  Shortness of breath and syncope. EXAM: PORTABLE CHEST 1 VIEW COMPARISON:  04/11/2018 FINDINGS: Mild cardiomegaly. No focal airspace consolidation or pulmonary edema. No pleural effusion or pneumothorax. IMPRESSION: No active cardiopulmonary disease. Mild cardiomegaly. Electronically Signed   By: Ulyses Jarred M.D.   On: 11/21/2018 19:21     Cardiac Studies   EF normal  Patient Profile     84 y.o. female permanent atrial fibrillation with bradycardia diabetes hypothyroidism hypertension occasional hypoglycemia hyperlipidemia admitted from the office after witnessed dizzy episode, possible syncope for further cardiac monitoring.  Assessment & Plan    Dizziness/syncope - Still not quite sure what the ultimate etiology is for her episodes.  Could be disequilibrium?  Could have been hypoglycemia-in the office her blood sugars were 44 just after the event.  Could be transient orthostasis?  She does have known bradycardia however she has been in persistent atrial fibrillation this whole time, no evidence of paroxysmal A. fib with compensatory pauses.  Also despite her bradycardia at times in the 40s, she is asymptomatic with this.  I do not think that a pacemaker is warranted at this time.  It would not be a bad idea however to have her revisit the  EP clinic as she did in November 2019 for further discussion.  She knows to be absolutely careful with ambulation, getting up etc.  Chronic diastolic heart failure - Creatinine has increased slightly from 1.5-1.8.  Was 1.4 in March 2020.  Good overall diuresis with IV Lasix, about 3 L net out. -Continue with home Lasix dose as previously prescribed.  [place] for DC.    For questions or updates,  please contact [place] Please consult [email or web address] for contact info under        Signed, Candee Furbish, MD

## 2018-11-23 NOTE — Plan of Care (Signed)
Patient discharged to go home. DC instructions given and verbalizes understanding

## 2018-11-24 NOTE — ED Provider Notes (Signed)
[facility] Provider Note   CSN: [id number] Arrival date & time: 11/21/18  1627     History   Chief Complaint No chief complaint on file.   HPI Summer Hawkins is a 84 y.o. female.     HPI 84 y/o comes in w/ cc of near fainting spell. She has hx of AF, DM and she had the episode whilst at the Cardiologist. Has had weakness and dizziness for the last few days and was advised to come to the ER earlier, but she preferred Cards f/u in clinic.  HR has been labile. She denies any new med changes. Pt has no chest pain. She is on DOAC.  Pt denies nausea, emesis, fevers, chills, chest pains, shortness of breath, headaches, abdominal pain, uti like symptoms.   Past Medical History:  Diagnosis Date  . Atrial fibrillation ([place])   . Breast cancer ([place])   . Breast cancer, left ([place])   . Chronic kidney disease    stage III - patient was unaware  . Diabetes mellitus   . Dyspnea   . Dysrhythmia    Afib  . H/O: hysterectomy   . History of colonoscopy 01/25/2010  . History of mammogram 08/04/2009  . Hyperlipidemia   . Hypertension   . Hypothyroidism   . Ketoacidosis, diabetic, no coma, non-insulin dependent    Type II  . Vitamin B12 deficiency     Patient Active Problem List   Diagnosis Date Noted  . Syncope and collapse 11/21/2018  . Bradycardia 09/25/2017  . Permanent atrial fibrillation   . Chronic diastolic CHF (congestive heart failure) ([place]) 08/08/2017  . Essential hypertension 08/08/2017  . Breast cancer of upper-outer quadrant of left female breast ([place]) 01/05/2016  . Chronic atrial fibrillation 01/05/2011    Past Surgical History:  Procedure Laterality Date  . ABDOMINAL HYSTERECTOMY    . Bilateral foot surgery    . BREAST EXCISIONAL BIOPSY    . BREAST LUMPECTOMY Left    2017  . BREAST LUMPECTOMY WITH RADIOACTIVE SEED AND SENTINEL LYMPH NODE BIOPSY Left 01/19/2016   Procedure: LEFT BREAST LUMPECTOMY WITH RADIOACTIVE SEED AND SENTINEL  LYMPH NODE BIOPSY;  Surgeon: Autumn Messing III, MD;  Location: [facility];  Service: General;  Laterality: Left;  . BREAST LUMPECTOMY WITH RADIOACTIVE SEED LOCALIZATION Left 01/19/2016  . CARDIOVERSION  02/27/2011   Procedure: CARDIOVERSION;  Surgeon: Loralie Champagne, MD;  Location: [facility];  Service: Cardiovascular;  Laterality: N/A;  . LAPAROSCOPIC CHOLECYSTECTOMY    . ROTATOR CUFF REPAIR Left   . TUBAL LIGATION       OB History   No obstetric history on file.      Home Medications    Prior to Admission medications   Medication Sig Start Date End Date Taking? Authorizing Provider  amLODipine (NORVASC) 5 MG tablet TAKE 1 TABLET(5 MG) BY MOUTH TWICE DAILY Patient taking differently: Take 5 mg by mouth 2 (two) times daily.  03/07/18  Yes Bhagat, Bhavinkumar, PA  anastrozole (ARIMIDEX) 1 MG tablet TAKE 1 TABLET(1 MG) BY MOUTH DAILY Patient taking differently: Take 1 mg by mouth daily.  11/20/17  Yes Nicholas Lose, MD  apixaban (ELIQUIS) 5 MG TABS tablet Take 1 tablet (5 mg total) by mouth 2 (two) times daily. 11/16/16  Yes Nicholas Lose, MD  diphenhydramine-acetaminophen (TYLENOL PM) 25-500 MG TABS tablet Take 1 tablet by mouth at bedtime as needed (for sleep).   Yes [provider]  ferrous sulfate 325 (65 FE) MG tablet  Take 325 mg by mouth daily with breakfast.     Yes [provider]  furosemide (LASIX) 20 MG tablet Take one tablet by mouth daily as needed for swelling or weight gain Patient taking differently: Take 20 mg by mouth See admin instructions. Take 20 mg by mouth once a day as needed for swelling or weight gain 01/02/18  Yes Burnell Blanks, MD  glimepiride (AMARYL) 2 MG tablet Take 2 mg by mouth daily with breakfast.  09/11/11  Yes [provider]  levothyroxine (SYNTHROID) 137 MCG tablet Take 137 mcg by mouth daily. 07/02/18  Yes [provider]  losartan (COZAAR) 100 MG tablet Take 100 mg by mouth daily. 07/02/18  Yes [provider]   metFORMIN (GLUCOPHAGE) 850 MG tablet Take 850 mg by mouth 2 (two) times daily with a meal.    Yes [provider]  Multiple Vitamins-Minerals (MULTIVITAMIN WITH MINERALS) tablet Take 1 tablet by mouth daily.     Yes [provider]  naproxen sodium (ALEVE) 220 MG tablet Take 220 mg by mouth 2 (two) times daily as needed (for pain or headaches).   Yes [provider]  TOUJEO SOLOSTAR 300 UNIT/ML SOPN Inject 15 Units into the skin See admin instructions. Inject 15 units into the skin in the morning before breakfast and 15 units at bedtime 03/12/15  Yes [provider]  BD PEN NEEDLE NANO U/F 32G X 4 MM MISC daily.  03/27/18   [provider]  ONE TOUCH ULTRA TEST test strip daily.  04/03/18   [provider]    Family History Family History  Problem Relation Age of Onset  . Heart failure Father 56       enlarged heart  . Pneumonia Mother 27  . Diabetes Mother 90  . Breast cancer Sister   . Cancer Neg Hx     Social History Social History   Tobacco Use  . Smoking status: Never Smoker  . Smokeless tobacco: Never Used  Substance Use Topics  . Alcohol use: No    Alcohol/week: 0.0 standard drinks  . Drug use: No     Allergies   Bee venom and Penicillins   Review of Systems Review of Systems  Constitutional: Positive for activity change.  Neurological: Positive for syncope.  All other systems reviewed and are negative.    Physical Exam Updated Vital Signs BP 135/65 (BP Location: Right Arm)   Pulse (!) 45   Temp 98.7 F (37.1 C) (Oral)   Resp 18   Ht 5\' 4"  (1.626 m)   Wt 103.1 kg   LMP  (LMP Unknown)   SpO2 97%   BMI 39.00 kg/m   Physical Exam Vitals signs and nursing note reviewed.  Constitutional:      Appearance: She is well-developed.  HENT:     Head: Normocephalic and atraumatic.  Eyes:     Pupils: Pupils are equal, round, and reactive to light.  Neck:     Musculoskeletal: Neck supple.  Cardiovascular:      Rate and Rhythm: Normal rate. Rhythm irregular.     Heart sounds: Murmur present.  Pulmonary:     Effort: Pulmonary effort is normal. No respiratory distress.  Abdominal:     General: There is no distension.     Palpations: Abdomen is soft.     Tenderness: There is no abdominal tenderness. There is no guarding or rebound.  Musculoskeletal:     Right lower leg: No edema.  Left lower leg: No edema.  Skin:    General: Skin is warm and dry.  Neurological:     Mental Status: She is alert and oriented to person, place, and time.      ED Treatments / Results  Labs (all labs ordered are listed, but only abnormal results are displayed) Labs Reviewed  BASIC METABOLIC PANEL - Abnormal; Notable for the following components:      Result Value   CO2 18 (*)    Glucose, Bld 109 (*)    BUN 28 (*)    Creatinine, Ser 1.51 (*)    Calcium 8.8 (*)    GFR calc non Af Amer 32 (*)    GFR calc Af Amer 37 (*)    All other components within normal limits  CBC WITH DIFFERENTIAL/PLATELET - Abnormal; Notable for the following components:   WBC 10.8 (*)    RBC 3.39 (*)    Hemoglobin 10.4 (*)    HCT 33.4 (*)    All other components within normal limits  PROTIME-INR - Abnormal; Notable for the following components:   Prothrombin Time 16.9 (*)    INR 1.4 (*)    All other components within normal limits  BRAIN NATRIURETIC PEPTIDE - Abnormal; Notable for the following components:   B Natriuretic Peptide 233.5 (*)    All other components within normal limits  HEMOGLOBIN A1C - Abnormal; Notable for the following components:   Hgb A1c MFr Bld 6.9 (*)    All other components within normal limits  BASIC METABOLIC PANEL - Abnormal; Notable for the following components:   Glucose, Bld 257 (*)    BUN 30 (*)    Creatinine, Ser 1.82 (*)    GFR calc non Af Amer 25 (*)    GFR calc Af Amer 29 (*)    All other components within normal limits  GLUCOSE, CAPILLARY - Abnormal; Notable for the following  components:   Glucose-Capillary 117 (*)    All other components within normal limits  GLUCOSE, CAPILLARY - Abnormal; Notable for the following components:   Glucose-Capillary 186 (*)    All other components within normal limits  GLUCOSE, CAPILLARY - Abnormal; Notable for the following components:   Glucose-Capillary 176 (*)    All other components within normal limits  GLUCOSE, CAPILLARY - Abnormal; Notable for the following components:   Glucose-Capillary 239 (*)    All other components within normal limits  GLUCOSE, CAPILLARY - Abnormal; Notable for the following components:   Glucose-Capillary 107 (*)    All other components within normal limits  GLUCOSE, CAPILLARY - Abnormal; Notable for the following components:   Glucose-Capillary 117 (*)    All other components within normal limits  GLUCOSE, CAPILLARY - Abnormal; Notable for the following components:   Glucose-Capillary 191 (*)    All other components within normal limits  CBG MONITORING, ED - Abnormal; Notable for the following components:   Glucose-Capillary 111 (*)    All other components within normal limits  SARS CORONAVIRUS 2 (TAT 6-24 HRS)  TSH  MAGNESIUM  GLUCOSE, CAPILLARY  CBG MONITORING, ED    EKG EKG Interpretation  Date/Time:  Thursday November 21 2018 16:46:36 EDT Ventricular Rate:  47 PR Interval:    QRS Duration: 136 QT Interval:  588 QTC Calculation: 520 R Axis:   68 Text Interpretation:  Atrial fibrillation IVCD, consider atypical RBBB Confirmed by Julianne Rice [phone]) on 11/22/2018 4:20:17 PM   Radiology No results found.  Procedures Procedures (including critical  care time)  Medications Ordered in ED Medications - No data to display   Initial Impression / Assessment and Plan / ED Course  I have reviewed the triage vital signs and the nursing notes.  Pertinent labs & imaging results that were available during my care of the patient were reviewed by me and considered in my medical  decision making (see chart for details).        DDx includes: Orthostatic hypotension Stroke Vertebral artery dissection/stenosis Dysrhythmia PE Vasovagal/neurocardiogenic syncope Aortic stenosis Valvular disorder/Cardiomyopathy Anemia   Pt has had episodes of dizziness and near syncope. At the clinic she had likely a syncope episode. Cards to see patient. VSS and WNL, current exam shows no decompensation.  Final Clinical Impressions(s) / ED Diagnoses   Final diagnoses:  Syncope and collapse  Atrial fibrillation, unspecified type [facility])    ED Discharge Orders         Ordered    Increase activity slowly     11/23/18 1157    Diet - low sodium heart healthy     11/23/18 1157           Varney Biles, MD 11/24/18 2350

## 2018-11-26 DIAGNOSIS — Z4802 Encounter for removal of sutures: Secondary | ICD-10-CM | POA: Diagnosis not present

## 2018-11-26 DIAGNOSIS — N183 Chronic kidney disease, stage 3 (moderate): Secondary | ICD-10-CM | POA: Diagnosis not present

## 2018-11-26 DIAGNOSIS — I509 Heart failure, unspecified: Secondary | ICD-10-CM | POA: Diagnosis not present

## 2018-11-26 DIAGNOSIS — R55 Syncope and collapse: Secondary | ICD-10-CM | POA: Diagnosis not present

## 2018-11-26 DIAGNOSIS — I130 Hypertensive heart and chronic kidney disease with heart failure and stage 1 through stage 4 chronic kidney disease, or unspecified chronic kidney disease: Secondary | ICD-10-CM | POA: Diagnosis not present

## 2018-11-27 ENCOUNTER — Ambulatory Visit (INDEPENDENT_AMBULATORY_CARE_PROVIDER_SITE_OTHER)
Admission: RE | Admit: 2018-11-27 | Discharge: 2018-11-27 | Disposition: A | Discharge status: Home / Self Care | Payer: Medicare Other | Source: Ambulatory Visit | Attending: Family | Admitting: Family

## 2018-11-27 ENCOUNTER — Other Ambulatory Visit: Payer: Self-pay

## 2018-11-27 ENCOUNTER — Other Ambulatory Visit (HOSPITAL_COMMUNITY): Payer: Self-pay | Admitting: Internal Medicine

## 2018-11-27 DIAGNOSIS — R55 Syncope and collapse: Secondary | ICD-10-CM

## 2018-11-27 DIAGNOSIS — I5032 Chronic diastolic (congestive) heart failure: Secondary | ICD-10-CM | POA: Diagnosis not present

## 2018-11-27 DIAGNOSIS — R001 Bradycardia, unspecified: Secondary | ICD-10-CM | POA: Diagnosis not present

## 2018-11-27 DIAGNOSIS — Z23 Encounter for immunization: Secondary | ICD-10-CM | POA: Diagnosis not present

## 2018-11-27 DIAGNOSIS — R531 Weakness: Secondary | ICD-10-CM | POA: Diagnosis not present

## 2018-11-27 DIAGNOSIS — I129 Hypertensive chronic kidney disease with stage 1 through stage 4 chronic kidney disease, or unspecified chronic kidney disease: Secondary | ICD-10-CM | POA: Diagnosis not present

## 2018-11-27 DIAGNOSIS — R0602 Shortness of breath: Secondary | ICD-10-CM | POA: Diagnosis not present

## 2018-11-27 DIAGNOSIS — N183 Chronic kidney disease, stage 3 (moderate): Secondary | ICD-10-CM | POA: Diagnosis not present

## 2018-11-27 DIAGNOSIS — Z20828 Contact with and (suspected) exposure to other viral communicable diseases: Secondary | ICD-10-CM | POA: Diagnosis not present

## 2018-11-27 DIAGNOSIS — I4891 Unspecified atrial fibrillation: Secondary | ICD-10-CM | POA: Diagnosis not present

## 2018-11-27 DIAGNOSIS — I130 Hypertensive heart and chronic kidney disease with heart failure and stage 1 through stage 4 chronic kidney disease, or unspecified chronic kidney disease: Secondary | ICD-10-CM | POA: Diagnosis not present

## 2018-11-29 ENCOUNTER — Telehealth: Payer: Self-pay | Admitting: Cardiovascular Disease

## 2018-11-29 NOTE — Telephone Encounter (Signed)
STAT if HR is under 50 or over 120 (normal HR is 60-100 beats per minute)  1) What is your heart rate? *40,42, 46,40 and 49  2) Do you have a log of your heart rate readings (document readings)?   3) Do you have any other symptoms?  Shortness of breath,weakness and no energy

## 2018-11-29 NOTE — Telephone Encounter (Signed)
The patient is concerned because her HR has been in the 40s ever since she was discharged from the hospital (she was discharged with HR 45) and she has no energy.  She was told she would be given an appointment with Dr. Curt Bears at discharge from recent hospital stay but was not scheduled. She saw Dr. Curt Bears November 2019 and PPM was not indicated at that time, but she is becoming more symptomatic.  Her BP at home is staying 130s/70s since discharge and HR in the 40s. Her main complaint is fatigue but she denies dizziness and feeling like she may faint. She was discharged 9/12. She understands Dr. Macky Lower nurse will call her to arrange appointment. ED precautions reviewed. She was grateful for assistance.

## 2018-11-30 ENCOUNTER — Inpatient Hospital Stay (HOSPITAL_COMMUNITY)
Admission: EM | Admit: 2018-11-30 | Discharge: 2018-12-03 | DRG: 243 | Disposition: A | Discharge status: Home / Self Care | Payer: Medicare Other | Attending: Cardiovascular Disease | Admitting: Cardiovascular Disease

## 2018-11-30 ENCOUNTER — Telehealth: Payer: Self-pay | Admitting: Student

## 2018-11-30 ENCOUNTER — Emergency Department (HOSPITAL_COMMUNITY): Payer: Medicare Other

## 2018-11-30 ENCOUNTER — Other Ambulatory Visit: Payer: Self-pay

## 2018-11-30 ENCOUNTER — Encounter (HOSPITAL_COMMUNITY): Payer: Self-pay

## 2018-11-30 DIAGNOSIS — Z833 Family history of diabetes mellitus: Secondary | ICD-10-CM

## 2018-11-30 DIAGNOSIS — Z79899 Other long term (current) drug therapy: Secondary | ICD-10-CM

## 2018-11-30 DIAGNOSIS — R001 Bradycardia, unspecified: Principal | ICD-10-CM | POA: Diagnosis present

## 2018-11-30 DIAGNOSIS — Z90710 Acquired absence of both cervix and uterus: Secondary | ICD-10-CM | POA: Diagnosis not present

## 2018-11-30 DIAGNOSIS — Z6839 Body mass index (BMI) 39.0-39.9, adult: Secondary | ICD-10-CM | POA: Diagnosis not present

## 2018-11-30 DIAGNOSIS — R0609 Other forms of dyspnea: Secondary | ICD-10-CM

## 2018-11-30 DIAGNOSIS — Z880 Allergy status to penicillin: Secondary | ICD-10-CM

## 2018-11-30 DIAGNOSIS — Z23 Encounter for immunization: Secondary | ICD-10-CM | POA: Diagnosis not present

## 2018-11-30 DIAGNOSIS — Z7901 Long term (current) use of anticoagulants: Secondary | ICD-10-CM

## 2018-11-30 DIAGNOSIS — I4821 Permanent atrial fibrillation: Secondary | ICD-10-CM | POA: Diagnosis not present

## 2018-11-30 DIAGNOSIS — Z91030 Bee allergy status: Secondary | ICD-10-CM

## 2018-11-30 DIAGNOSIS — N183 Chronic kidney disease, stage 3 unspecified: Secondary | ICD-10-CM

## 2018-11-30 DIAGNOSIS — I4891 Unspecified atrial fibrillation: Secondary | ICD-10-CM | POA: Diagnosis not present

## 2018-11-30 DIAGNOSIS — R0602 Shortness of breath: Secondary | ICD-10-CM | POA: Diagnosis not present

## 2018-11-30 DIAGNOSIS — R531 Weakness: Secondary | ICD-10-CM | POA: Diagnosis not present

## 2018-11-30 DIAGNOSIS — Z9049 Acquired absence of other specified parts of digestive tract: Secondary | ICD-10-CM | POA: Diagnosis not present

## 2018-11-30 DIAGNOSIS — E039 Hypothyroidism, unspecified: Secondary | ICD-10-CM | POA: Diagnosis present

## 2018-11-30 DIAGNOSIS — I5032 Chronic diastolic (congestive) heart failure: Secondary | ICD-10-CM | POA: Diagnosis not present

## 2018-11-30 DIAGNOSIS — E538 Deficiency of other specified B group vitamins: Secondary | ICD-10-CM | POA: Diagnosis not present

## 2018-11-30 DIAGNOSIS — Z853 Personal history of malignant neoplasm of breast: Secondary | ICD-10-CM

## 2018-11-30 DIAGNOSIS — I441 Atrioventricular block, second degree: Secondary | ICD-10-CM

## 2018-11-30 DIAGNOSIS — Z794 Long term (current) use of insulin: Secondary | ICD-10-CM | POA: Diagnosis not present

## 2018-11-30 DIAGNOSIS — Z20828 Contact with and (suspected) exposure to other viral communicable diseases: Secondary | ICD-10-CM | POA: Diagnosis present

## 2018-11-30 DIAGNOSIS — E785 Hyperlipidemia, unspecified: Secondary | ICD-10-CM | POA: Diagnosis present

## 2018-11-30 DIAGNOSIS — Z8249 Family history of ischemic heart disease and other diseases of the circulatory system: Secondary | ICD-10-CM

## 2018-11-30 DIAGNOSIS — E1122 Type 2 diabetes mellitus with diabetic chronic kidney disease: Secondary | ICD-10-CM | POA: Diagnosis present

## 2018-11-30 DIAGNOSIS — Z79890 Hormone replacement therapy (postmenopausal): Secondary | ICD-10-CM

## 2018-11-30 DIAGNOSIS — E669 Obesity, unspecified: Secondary | ICD-10-CM | POA: Diagnosis not present

## 2018-11-30 DIAGNOSIS — I130 Hypertensive heart and chronic kidney disease with heart failure and stage 1 through stage 4 chronic kidney disease, or unspecified chronic kidney disease: Secondary | ICD-10-CM | POA: Diagnosis present

## 2018-11-30 DIAGNOSIS — I482 Chronic atrial fibrillation, unspecified: Secondary | ICD-10-CM | POA: Diagnosis not present

## 2018-11-30 DIAGNOSIS — Z803 Family history of malignant neoplasm of breast: Secondary | ICD-10-CM

## 2018-11-30 DIAGNOSIS — I4819 Other persistent atrial fibrillation: Secondary | ICD-10-CM | POA: Diagnosis not present

## 2018-11-30 DIAGNOSIS — R0600 Dyspnea, unspecified: Secondary | ICD-10-CM

## 2018-11-30 DIAGNOSIS — I129 Hypertensive chronic kidney disease with stage 1 through stage 4 chronic kidney disease, or unspecified chronic kidney disease: Secondary | ICD-10-CM | POA: Diagnosis not present

## 2018-11-30 DIAGNOSIS — I4430 Unspecified atrioventricular block: Secondary | ICD-10-CM | POA: Diagnosis not present

## 2018-11-30 LAB — CBC
HCT: 34.3 % — ABNORMAL LOW (ref 36.0–46.0)
Hemoglobin: 10.8 g/dL — ABNORMAL LOW (ref 12.0–15.0)
MCH: 30.7 pg (ref 26.0–34.0)
MCHC: 31.5 g/dL (ref 30.0–36.0)
MCV: 97.4 fL (ref 80.0–100.0)
Platelets: 286 10*3/uL (ref 150–400)
RBC: 3.52 MIL/uL — ABNORMAL LOW (ref 3.87–5.11)
RDW: 13.3 % (ref 11.5–15.5)
WBC: 11.6 10*3/uL — ABNORMAL HIGH (ref 4.0–10.5)
nRBC: 0 % (ref 0.0–0.2)

## 2018-11-30 LAB — URINALYSIS, ROUTINE W REFLEX MICROSCOPIC
Bacteria, UA: NONE SEEN
Bilirubin Urine: NEGATIVE
Glucose, UA: NEGATIVE mg/dL
Hgb urine dipstick: NEGATIVE
Ketones, ur: NEGATIVE mg/dL
Nitrite: NEGATIVE
Protein, ur: 100 mg/dL — AB
Specific Gravity, Urine: 1.015 (ref 1.005–1.030)
pH: 5 (ref 5.0–8.0)

## 2018-11-30 LAB — BRAIN NATRIURETIC PEPTIDE: B Natriuretic Peptide: 349 pg/mL — ABNORMAL HIGH (ref 0.0–100.0)

## 2018-11-30 LAB — BASIC METABOLIC PANEL
Anion gap: 11 (ref 5–15)
BUN: 29 mg/dL — ABNORMAL HIGH (ref 8–23)
CO2: 21 mmol/L — ABNORMAL LOW (ref 22–32)
Calcium: 9.3 mg/dL (ref 8.9–10.3)
Chloride: 106 mmol/L (ref 98–111)
Creatinine, Ser: 1.62 mg/dL — ABNORMAL HIGH (ref 0.44–1.00)
GFR calc Af Amer: 34 mL/min — ABNORMAL LOW (ref >60)
GFR calc non Af Amer: 29 mL/min — ABNORMAL LOW (ref >60)
Glucose, Bld: 167 mg/dL — ABNORMAL HIGH (ref 70–99)
Potassium: 4.8 mmol/L (ref 3.5–5.1)
Sodium: 138 mmol/L (ref 135–145)

## 2018-11-30 LAB — SARS CORONAVIRUS 2 BY RT PCR (HOSPITAL ORDER, PERFORMED IN ~~LOC~~ HOSPITAL LAB): SARS Coronavirus 2: NEGATIVE

## 2018-11-30 LAB — TROPONIN I (HIGH SENSITIVITY)
Troponin I (High Sensitivity): 22 ng/L — ABNORMAL HIGH (ref <18)
Troponin I (High Sensitivity): 18 ng/L — ABNORMAL HIGH (ref <18)

## 2018-11-30 LAB — CBG MONITORING, ED: Glucose-Capillary: 215 mg/dL — ABNORMAL HIGH (ref 70–99)

## 2018-11-30 LAB — GLUCOSE, CAPILLARY: Glucose-Capillary: 221 mg/dL — ABNORMAL HIGH (ref 70–99)

## 2018-11-30 MED ORDER — FERROUS SULFATE 325 (65 FE) MG PO TABS
325.0000 mg | ORAL_TABLET | Freq: Every day | ORAL | Status: DC
  Administered 2018-12-01 – 2018-12-03 (×3): 325 mg via ORAL
  Filled 2018-11-30 (×3): qty 1

## 2018-11-30 MED ORDER — AMLODIPINE BESYLATE 5 MG PO TABS
5.0000 mg | ORAL_TABLET | Freq: Two times a day (BID) | ORAL | Status: DC
  Administered 2018-11-30 – 2018-12-03 (×6): 5 mg via ORAL
  Filled 2018-11-30 (×6): qty 1

## 2018-11-30 MED ORDER — INSULIN GLARGINE 100 UNIT/ML ~~LOC~~ SOLN
15.0000 [IU] | Freq: Two times a day (BID) | SUBCUTANEOUS | Status: DC
  Administered 2018-11-30 – 2018-12-03 (×5): 15 [IU] via SUBCUTANEOUS
  Filled 2018-11-30 (×7): qty 0.15

## 2018-11-30 MED ORDER — APIXABAN 2.5 MG PO TABS
2.5000 mg | ORAL_TABLET | Freq: Two times a day (BID) | ORAL | Status: DC
  Administered 2018-11-30 – 2018-12-01 (×2): 2.5 mg via ORAL
  Filled 2018-11-30 (×2): qty 1

## 2018-11-30 MED ORDER — LOSARTAN POTASSIUM 50 MG PO TABS
100.0000 mg | ORAL_TABLET | Freq: Every day | ORAL | Status: DC
  Administered 2018-12-01 – 2018-12-03 (×3): 100 mg via ORAL
  Filled 2018-11-30 (×3): qty 2

## 2018-11-30 MED ORDER — ANASTROZOLE 1 MG PO TABS
1.0000 mg | ORAL_TABLET | Freq: Every day | ORAL | Status: DC
  Administered 2018-12-01 – 2018-12-03 (×3): 1 mg via ORAL
  Filled 2018-11-30 (×4): qty 1

## 2018-11-30 MED ORDER — APIXABAN 5 MG PO TABS: 5.0000 mg | ORAL_TABLET | Freq: Two times a day (BID) | ORAL | Status: DC

## 2018-11-30 MED ORDER — LEVOTHYROXINE SODIUM 25 MCG PO TABS
137.0000 ug | ORAL_TABLET | Freq: Every day | ORAL | Status: DC
  Administered 2018-12-01 – 2018-12-03 (×3): 137 ug via ORAL
  Filled 2018-11-30 (×4): qty 1

## 2018-11-30 NOTE — ED Triage Notes (Signed)
Pt endorses shob and bradycardia x 1 week. Unable to take 3 steps with getting shob. Pt has seen a cardiologist due to HR in the 40s and supposed to have more tests and possibly a pacemaker. Axox4. HR 49 in triage. Denies CP.

## 2018-11-30 NOTE — Telephone Encounter (Signed)
    Patient called the after hours line reporting worsening dyspnea for the past week. Says her HR has been in the 40's and has not gone above that, even with ambulation or doing activities around the house. No associated chest pain, palpitations, PND, or edema. Weight has been stable on her home scales (takes PRN Lasix).  At the time of our call, she did not sound short of breath. She wanted to see if I could arrange for her to have PPM placement. I informed her she would need to be evaluated by Electrophysiology to see if this is in fact appropriate (message had been sent to Dr. Ileana Ladd and his nurse yesterday by review of notes).   She reported her dyspnea has progressed since her recent admission and continues to worsen. Given her progressive symptoms, I recommended she come to the ED for evaluation. [facility] Triage made aware.   Signed, Erma Heritage, PA-C 11/30/2018, 10:49 AM Pager: [phone]

## 2018-11-30 NOTE — ED Notes (Signed)
Dinner tray arrived 

## 2018-11-30 NOTE — ED Provider Notes (Addendum)
[facility] EMERGENCY DEPARTMENT Provider Note   CSN: [id number] Arrival date & time: 11/30/18  1126     History   Chief Complaint Chief Complaint  Patient presents with   Shortness of Breath   Bradycardia    HPI Summer Hawkins is a 84 y.o. female.     Patient c/o feeling generally weak, tired, no energy, and sob in the past couple weeks. Symptoms gradual onset, moderate, persistent, slowly worse. Worse w any activity or exertion. Denies chest pain. No cough or uri symptoms. No fever or chills. No increased leg swelling or leg pain. Patient has hx chronic afib, compliant w home meds including anticoag therapy. Hx bradycardia - states is being considered for possible pacemaker.   The history is provided by the patient.  Shortness of Breath Associated symptoms: no abdominal pain, no chest pain, no cough, no fever, no headaches, no neck pain, no rash, no sore throat and no vomiting     Past Medical History:  Diagnosis Date   Atrial fibrillation ([place])    Breast cancer ([place])    Breast cancer, left ([place])    Chronic kidney disease    stage III - patient was unaware   Diabetes mellitus    Dyspnea    Dysrhythmia    Afib   H/O: hysterectomy    History of colonoscopy 01/25/2010   History of mammogram 08/04/2009   Hyperlipidemia    Hypertension    Hypothyroidism    Ketoacidosis, diabetic, no coma, non-insulin dependent    Type II   Vitamin B12 deficiency     Patient Active Problem List   Diagnosis Date Noted   Syncope and collapse 11/21/2018   Bradycardia 09/25/2017   Permanent atrial fibrillation    Chronic diastolic CHF (congestive heart failure) ([place]) 08/08/2017   Essential hypertension 08/08/2017   Breast cancer of upper-outer quadrant of left female breast ([place]) 01/05/2016   Chronic atrial fibrillation 01/05/2011    Past Surgical History:  Procedure Laterality Date   ABDOMINAL HYSTERECTOMY     Bilateral foot surgery      BREAST EXCISIONAL BIOPSY     BREAST LUMPECTOMY Left    2017   BREAST LUMPECTOMY WITH RADIOACTIVE SEED AND SENTINEL LYMPH NODE BIOPSY Left 01/19/2016   Procedure: LEFT BREAST LUMPECTOMY WITH RADIOACTIVE SEED AND SENTINEL LYMPH NODE BIOPSY;  Surgeon: Autumn Messing III, MD;  Location: [facility];  Service: General;  Laterality: Left;   BREAST LUMPECTOMY WITH RADIOACTIVE SEED LOCALIZATION Left 01/19/2016   CARDIOVERSION  02/27/2011   Procedure: CARDIOVERSION;  Surgeon: Loralie Champagne, MD;  Location: [facility];  Service: Cardiovascular;  Laterality: N/A;   LAPAROSCOPIC CHOLECYSTECTOMY     ROTATOR CUFF REPAIR Left    TUBAL LIGATION       OB History   No obstetric history on file.      Home Medications    Prior to Admission medications   Medication Sig Start Date End Date Taking? Authorizing Provider  amLODipine (NORVASC) 5 MG tablet TAKE 1 TABLET(5 MG) BY MOUTH TWICE DAILY Patient taking differently: Take 5 mg by mouth 2 (two) times daily.  03/07/18   Bhagat, Crista Luria, PA  anastrozole (ARIMIDEX) 1 MG tablet TAKE 1 TABLET(1 MG) BY MOUTH DAILY Patient taking differently: Take 1 mg by mouth daily.  11/20/17   Nicholas Lose, MD  apixaban (ELIQUIS) 5 MG TABS tablet Take 1 tablet (5 mg total) by mouth 2 (two) times daily. 11/16/16   Nicholas Lose, MD  BD PEN NEEDLE  NANO U/F 32G X 4 MM MISC daily.  03/27/18   [provider]  diphenhydramine-acetaminophen (TYLENOL PM) 25-500 MG TABS tablet Take 1 tablet by mouth at bedtime as needed (for sleep).    [provider]  ferrous sulfate 325 (65 FE) MG tablet Take 325 mg by mouth daily with breakfast.      [provider]  furosemide (LASIX) 20 MG tablet Take one tablet by mouth daily as needed for swelling or weight gain Patient taking differently: Take 20 mg by mouth See admin instructions. Take 20 mg by mouth once a day as needed for swelling or weight gain 01/02/18   Burnell Blanks, MD  glimepiride (AMARYL) 2 MG  tablet Take 2 mg by mouth daily with breakfast.  09/11/11   [provider]  levothyroxine (SYNTHROID) 137 MCG tablet Take 137 mcg by mouth daily. 07/02/18   [provider]  losartan (COZAAR) 100 MG tablet Take 100 mg by mouth daily. 07/02/18   [provider]  metFORMIN (GLUCOPHAGE) 850 MG tablet Take 850 mg by mouth 2 (two) times daily with a meal.     [provider]  Multiple Vitamins-Minerals (MULTIVITAMIN WITH MINERALS) tablet Take 1 tablet by mouth daily.      [provider]  naproxen sodium (ALEVE) 220 MG tablet Take 220 mg by mouth 2 (two) times daily as needed (for pain or headaches).    [provider]  ONE TOUCH ULTRA TEST test strip daily.  04/03/18   [provider]  TOUJEO SOLOSTAR 300 UNIT/ML SOPN Inject 15 Units into the skin See admin instructions. Inject 15 units into the skin in the morning before breakfast and 15 units at bedtime 03/12/15   [provider]    Family History Family History  Problem Relation Age of Onset   Heart failure Father 55       enlarged heart   Pneumonia Mother 26   Diabetes Mother 48   Breast cancer Sister    Cancer Neg Hx     Social History Social History   Tobacco Use   Smoking status: Never Smoker   Smokeless tobacco: Never Used  Substance Use Topics   Alcohol use: No    Alcohol/week: 0.0 standard drinks   Drug use: No     Allergies   Bee venom and Penicillins   Review of Systems Review of Systems  Constitutional: Negative for chills and fever.  HENT: Negative for sore throat.   Eyes: Negative for redness.  Respiratory: Positive for shortness of breath. Negative for cough.   Cardiovascular: Negative for chest pain, palpitations and leg swelling.  Gastrointestinal: Negative for abdominal pain, blood in stool, diarrhea and vomiting.  Endocrine: Negative for polyuria.  Genitourinary: Negative for flank pain.  Musculoskeletal: Negative for back pain  and neck pain.  Skin: Negative for rash.  Neurological: Negative for headaches.  Hematological:       On eliquis, no abnormal bruising or bleeding.   Psychiatric/Behavioral: Negative for confusion.     Physical Exam Updated Vital Signs BP (!) 159/47 (BP Location: Right Arm)    Pulse (!) 49    Temp 98 F (36.7 C) (Oral)    Resp 16    LMP  (LMP Unknown)    SpO2 100%   Physical Exam Vitals signs and nursing note reviewed.  Constitutional:      Appearance: Normal appearance. She is well-developed.  HENT:     Head: Atraumatic.     Nose:  Nose normal.     Mouth/Throat:     Mouth: Mucous membranes are moist.  Eyes:     General: No scleral icterus.    Conjunctiva/sclera: Conjunctivae normal.     Pupils: Pupils are equal, round, and reactive to light.  Neck:     Musculoskeletal: Normal range of motion and neck supple. No neck rigidity or muscular tenderness.     Trachea: No tracheal deviation.  Cardiovascular:     Rate and Rhythm: Bradycardia present. Rhythm irregular.     Pulses: Normal pulses.     Heart sounds: Normal heart sounds. No murmur. No friction rub. No gallop.   Pulmonary:     Effort: No respiratory distress.     Breath sounds: Normal breath sounds.     Comments: Appears mildly dyspneic. o2 sats 99% room air.  Abdominal:     General: Bowel sounds are normal. There is no distension.     Palpations: Abdomen is soft. There is no mass.     Tenderness: There is no abdominal tenderness. There is no guarding.  Genitourinary:    Comments: No cva tenderness.  Musculoskeletal:        General: No swelling or tenderness.  Skin:    General: Skin is warm and dry.     Findings: No rash.  Neurological:     Mental Status: She is alert.     Comments: Alert, speech normal.   Psychiatric:        Mood and Affect: Mood normal.      ED Treatments / Results  Labs (all labs ordered are listed, but only abnormal results are displayed) Results for orders placed or performed during  the hospital encounter of [id number]  Basic metabolic panel  Result Value Ref Range   Sodium 138 135 - 145 mmol/L   Potassium 4.8 3.5 - 5.1 mmol/L   Chloride 106 98 - 111 mmol/L   CO2 21 (L) 22 - 32 mmol/L   Glucose, Bld 167 (H) 70 - 99 mg/dL   BUN 29 (H) 8 - 23 mg/dL   Creatinine, Ser 1.62 (H) 0.44 - 1.00 mg/dL   Calcium 9.3 8.9 - 10.3 mg/dL   GFR calc non Af Amer 29 (L) >60 mL/min   GFR calc Af Amer 34 (L) >60 mL/min   Anion gap 11 5 - 15  CBC  Result Value Ref Range   WBC 11.6 (H) 4.0 - 10.5 K/uL   RBC 3.52 (L) 3.87 - 5.11 MIL/uL   Hemoglobin 10.8 (L) 12.0 - 15.0 g/dL   HCT 34.3 (L) 36.0 - 46.0 %   MCV 97.4 80.0 - 100.0 fL   MCH 30.7 26.0 - 34.0 pg   MCHC 31.5 30.0 - 36.0 g/dL   RDW 13.3 11.5 - 15.5 %   Platelets 286 150 - 400 K/uL   nRBC 0.0 0.0 - 0.2 %  Troponin I (High Sensitivity)  Result Value Ref Range   Troponin I (High Sensitivity) 22 (H) <18 ng/L   Dg Chest 2 View  Result Date: 11/30/2018 CLINICAL DATA:  Shortness of breath and bradycardia for week. EXAM: CHEST - 2 VIEW COMPARISON:  Chest x-rays dated 11/21/2018 and 04/11/2018. FINDINGS: Stable cardiomegaly. Lungs are clear. No pleural effusion or pneumothorax is seen. No acute appearing osseous abnormality. Chronic degenerative spondylosis of the thoracic spine, mild to moderate in degree. IMPRESSION: 1. No active cardiopulmonary disease. No evidence of pneumonia or pulmonary edema. 2. Stable cardiomegaly. Electronically Signed   By: Roxy Horseman.D.  On: 11/30/2018 12:34   Ct Head Wo Contrast  Result Date: 11/15/2018 CLINICAL DATA:  [place] and hit head on closet door with hematoma. EXAM: CT HEAD WITHOUT CONTRAST CT CERVICAL SPINE WITHOUT CONTRAST TECHNIQUE: Multidetector CT imaging of the head and cervical spine was performed following the standard protocol without intravenous contrast. Multiplanar CT image reconstructions of the cervical spine were also generated. COMPARISON:  None. FINDINGS: CT HEAD FINDINGS Brain:  There is no evidence of acute infarct, intracranial hemorrhage, mass, midline shift, or extra-axial fluid collection. There is mild cerebral atrophy. Cerebral white matter hypodensities are nonspecific but compatible with chronic small vessel ischemic disease, mild for age. Vascular: Calcified atherosclerosis at the skull base. No hyperdense vessel. Skull: No fracture or focal osseous lesion. Sinuses/Orbits: Trace mucosal thickening in the left maxillary sinus. Clear mastoid air cells. Bilateral cataract extraction. Other: Moderately large left scalp hematoma near the vertex. CT CERVICAL SPINE FINDINGS Alignment: Trace retrolisthesis of C5 on C6. Skull base and vertebrae: No fracture or destructive osseous process. Mild-to-moderate median C1-2 arthropathy. Soft tissues and spinal canal: No prevertebral fluid or swelling. No visible canal hematoma. Disc levels: Multilevel cervical disc degeneration, greatest at C5-6 where there is moderate to severe disc space narrowing and degenerative endplate sclerosis and spurring with disc bulging and uncovertebral spurring resulting in mild-to-moderate spinal stenosis and moderate bilateral neural foraminal stenosis. Upper chest: Clear lung apices. Other: Mild calcified atherosclerosis involving the carotid arteries. IMPRESSION: 1. No evidence of acute intracranial abnormality. 2. Left scalp hematoma. 3. Mild chronic small vessel ischemic disease and cerebral atrophy. 4. No evidence of acute fracture in the cervical spine. Electronically Signed   By: Logan Bores M.D.   On: 11/15/2018 05:33   Ct Cervical Spine Wo Contrast  Result Date: 11/15/2018 CLINICAL DATA:  [place] [address] and hit head on closet door with hematoma. EXAM: CT HEAD WITHOUT CONTRAST CT CERVICAL SPINE WITHOUT CONTRAST TECHNIQUE: Multidetector CT imaging of the head and cervical spine was performed following the standard protocol without intravenous contrast. Multiplanar CT image reconstructions of the cervical spine  were also generated. COMPARISON:  None. FINDINGS: CT HEAD FINDINGS Brain: There is no evidence of acute infarct, intracranial hemorrhage, mass, midline shift, or extra-axial fluid collection. There is mild cerebral atrophy. Cerebral white matter hypodensities are nonspecific but compatible with chronic small vessel ischemic disease, mild for age. Vascular: Calcified atherosclerosis at the skull base. No hyperdense vessel. Skull: No fracture or focal osseous lesion. Sinuses/Orbits: Trace mucosal thickening in the left maxillary sinus. Clear mastoid air cells. Bilateral cataract extraction. Other: Moderately large left scalp hematoma near the vertex. CT CERVICAL SPINE FINDINGS Alignment: Trace retrolisthesis of C5 on C6. Skull base and vertebrae: No fracture or destructive osseous process. Mild-to-moderate median C1-2 arthropathy. Soft tissues and spinal canal: No prevertebral fluid or swelling. No visible canal hematoma. Disc levels: Multilevel cervical disc degeneration, greatest at C5-6 where there is moderate to severe disc space narrowing and degenerative endplate sclerosis and spurring with disc bulging and uncovertebral spurring resulting in mild-to-moderate spinal stenosis and moderate bilateral neural foraminal stenosis. Upper chest: Clear lung apices. Other: Mild calcified atherosclerosis involving the carotid arteries. IMPRESSION: 1. No evidence of acute intracranial abnormality. 2. Left scalp hematoma. 3. Mild chronic small vessel ischemic disease and cerebral atrophy. 4. No evidence of acute fracture in the cervical spine. Electronically Signed   By: Logan Bores M.D.   On: 11/15/2018 05:33   Dg Chest Port 1 View  Result Date: 11/21/2018 CLINICAL DATA:  Shortness of  breath and syncope. EXAM: PORTABLE CHEST 1 VIEW COMPARISON:  04/11/2018 FINDINGS: Mild cardiomegaly. No focal airspace consolidation or pulmonary edema. No pleural effusion or pneumothorax. IMPRESSION: No active cardiopulmonary disease.  Mild cardiomegaly. Electronically Signed   By: Ulyses Jarred M.D.   On: 11/21/2018 19:21   Vas US Carotid  Result Date: 11/27/2018 Carotid Arterial Duplex Study Indications:       Syncope. Risk Factors:      Hypertension, no history of smoking. Other Factors:     A fib. Comparison Study:  No prior carotid artery study for comparison. Performing Technologist: Delorise Shiner RVT  Examination Guidelines: A complete evaluation includes B-mode imaging, spectral Doppler, color Doppler, and power Doppler as needed of all accessible portions of each vessel. Bilateral testing is considered an integral part of a complete examination. Limited examinations for reoccurring indications may be performed as noted.  Right Carotid Findings: +----------+--------+--------+--------+-----------------------+--------+             PSV cm/s EDV cm/s Stenosis Plaque Description      Comments  +----------+--------+--------+--------+-----------------------+--------+  CCA Prox   158      0                                                   +----------+--------+--------+--------+-----------------------+--------+  CCA Mid    112      14                                                  +----------+--------+--------+--------+-----------------------+--------+  CCA Distal 91       0                                                   +----------+--------+--------+--------+-----------------------+--------+  ICA Prox   112      24       1-39%    homogeneous and diffuse           +----------+--------+--------+--------+-----------------------+--------+  ICA Mid    94       17                                                  +----------+--------+--------+--------+-----------------------+--------+  ICA Distal 79       17                                                  +----------+--------+--------+--------+-----------------------+--------+  ECA        136      0                                                    +----------+--------+--------+--------+-----------------------+--------+ +----------+--------+-------+----------------+-------------------+  PSV cm/s EDV cms Describe         Arm Pressure (mmHG)  +----------+--------+-------+----------------+-------------------+  Subclavian 118      0       Multiphasic, WNL                      +----------+--------+-------+----------------+-------------------+ +---------+--------+--+--------+--+---------+  Vertebral PSV cm/s 59 EDV cm/s 12 Antegrade  +---------+--------+--+--------+--+---------+  Left Carotid Findings: +----------+--------+--------+--------+------------------+--------+             PSV cm/s EDV cm/s Stenosis Plaque Description Comments  +----------+--------+--------+--------+------------------+--------+  CCA Prox   107      14                                             +----------+--------+--------+--------+------------------+--------+  CCA Mid    83       18                                             +----------+--------+--------+--------+------------------+--------+  CCA Distal 83       8                 calcific and focal           +----------+--------+--------+--------+------------------+--------+  ICA Prox   93       13       1-39%    focal and calcific           +----------+--------+--------+--------+------------------+--------+  ICA Mid    81       18                                             +----------+--------+--------+--------+------------------+--------+  ICA Distal 100      16                                             +----------+--------+--------+--------+------------------+--------+  ECA        173      0                                              +----------+--------+--------+--------+------------------+--------+ +----------+--------+--------+----------------+-------------------+             PSV cm/s EDV cm/s Describe         Arm Pressure (mmHG)  +----------+--------+--------+----------------+-------------------+  Subclavian 131       0        Multiphasic, WNL                      +----------+--------+--------+----------------+-------------------+ +---------+--------+--+--------+-+---------+  Vertebral PSV cm/s 36 EDV cm/s 5 Antegrade  +---------+--------+--+--------+-+---------+  Summary: Right Carotid: Velocities in the right ICA are consistent with a 1-39% stenosis. Left Carotid: Velocities in the left ICA are consistent with a 1-39% stenosis. Vertebrals:  Bilateral vertebral arteries demonstrate antegrade flow. Subclavians: Normal flow hemodynamics were seen in bilateral subclavian  arteries. *See table(s) above for measurements and observations.  Electronically signed by Deitra Mayo MD on 11/27/2018 at 10:01:58 AM.    Final     EKG EKG Interpretation  Date/Time:  Saturday November 30 2018 11:45:23 EDT Ventricular Rate:  46 PR Interval:    QRS Duration: 66 QT Interval:  480 QTC Calculation: 420 R Axis:   113 Text Interpretation:  Atrial fibrillation with slow ventricular response Right axis deviation Low voltage QRS Confirmed by Lajean Saver [phone]) on 11/30/2018 1:34:12 PM   Radiology Dg Chest 2 View  Result Date: 11/30/2018 CLINICAL DATA:  Shortness of breath and bradycardia for week. EXAM: CHEST - 2 VIEW COMPARISON:  Chest x-rays dated 11/21/2018 and 04/11/2018. FINDINGS: Stable cardiomegaly. Lungs are clear. No pleural effusion or pneumothorax is seen. No acute appearing osseous abnormality. Chronic degenerative spondylosis of the thoracic spine, mild to moderate in degree. IMPRESSION: 1. No active cardiopulmonary disease. No evidence of pneumonia or pulmonary edema. 2. Stable cardiomegaly. Electronically Signed   By: Franki Cabot M.D.   On: 11/30/2018 12:34    Procedures Procedures (including critical care time)  Medications Ordered in ED Medications - No data to display   Initial Impression / Assessment and Plan / ED Course  I have reviewed the triage vital signs and the nursing  notes.  Pertinent labs & imaging results that were available during my care of the patient were reviewed by me and considered in my medical decision making (see chart for details).  Iv ns. Continuous pulse ox and monitor. Ecg. Stat labs. Pcxr.  Reviewed nursing notes and prior charts for additional history.   Labs reviewed by me - chem normal. CRF.   CXR reviewed by me - no pna or edema.   On monitor, hr as low as 36. Feel bradycardia is contributing/exacerbating patient symptoms. Will consult cardiology for evaluation/admission.  Discussed pt with cardiology - they will see in ED.       Final Clinical Impressions(s) / ED Diagnoses   Final diagnoses:  None    ED Discharge Orders    None           Lajean Saver, MD 11/30/18 1443

## 2018-11-30 NOTE — H&P (Addendum)
History & Physical    Patient ID: [name] MRN: [id number], DOB/AGE: May 25, 1934   Admit date: 11/30/2018  Primary Care Provider: Haywood Pao, MD Primary Cardiologist: [facility] Chandler, MD  Primary Electrophysiologist: [facility]  Chief Complaint:  Bradycardia and Dyspnea on Exertion  Patient Profile    [facility] is a 84 y.o. female with past medical history of permanent atrial fibrillation, chronic diastolic CHF, HTN, HLD, bradycardia (evaluated by EP and 2019 and not felt she needed PPM at that time) and Stage 3 CKD who presented to [facility] ED on 11/30/2018 for evaluation of worsening dyspnea and bradycardia. Being evaluated at the request of [facility].  History of Present Illness    [facility] was most recently admitted to [facility] from 9/10 - 11/23/2018 after having a syncopal visit at the time of her office visit.  She had been evaluated by [facility] the day of admission and reported worsening dizziness and weakness since an emergency department visit for a syncopal episode.  She was found to be orthostatic at the time of her visit but had a syncopal episode and was transferred to [facility] for further evaluation. Rate was found to be in the 40's to 50's during admission but no evidence of high-grade AV block. She did receive IV Lasix for a CHF exacerbation and diuresed 3 L and was sent home on PRN Lasix 20mg  daily. Was informed to follow-up with EP as an outpatient.  She called the after-hours line today reporting worsening dyspnea which was occurring with activity and heart rate was sustaining in the 40's per her report, therefore she was advised to come to the ED for further evaluation.  She reports having significant shortness of breath with walking from room to room in her home or trying to perform chores around the house. She has been checking her heart rate at home with a pulse oximeter while walking and says this has remained in the 40's. She  denies any associated chest pain or palpitations. Does experience dizziness. No recent orthopnea, PND, or lower extremity edema. She has only utilized Lasix once since hospital discharge. Says that weight has overall been stable on her home scales.  Initial labs today show WBC 11.6, Hgb 10.8, platelets 286, Na+ 138, K+ 4.8, and creatinine 1.62 (close to baseline). Initial HS Troponin 22. BNP pending. COVID negative.  CXR shows no active cardiopulmonary disease with no evidence of pulmonary edema or pneumonia. EKG shows atrial fibrillation with slow ventricular response, heart rate 46, with borderline RAD and no acute ST changes.   Telemetry shows her HR has been in the mid-30's to 40's.    Past Medical History:  Diagnosis Date  . Atrial fibrillation ([place])   . Breast cancer ([place])   . Breast cancer, left ([place])   . Chronic kidney disease    stage [facility] - patient was unaware  . Diabetes mellitus   . Dyspnea   . Dysrhythmia    Afib  . H/O: hysterectomy   . History of colonoscopy 01/25/2010  . History of mammogram 08/04/2009  . Hyperlipidemia   . Hypertension   . Hypothyroidism   . Ketoacidosis, diabetic, no coma, non-insulin dependent    Type II  . Vitamin B12 deficiency     Past Surgical History:  Procedure Laterality Date  . ABDOMINAL HYSTERECTOMY    . Bilateral foot surgery    . BREAST EXCISIONAL BIOPSY    . BREAST LUMPECTOMY Left  2017  . BREAST LUMPECTOMY WITH RADIOACTIVE SEED AND SENTINEL LYMPH NODE BIOPSY Left 01/19/2016   Procedure: LEFT BREAST LUMPECTOMY WITH RADIOACTIVE SEED AND SENTINEL LYMPH NODE BIOPSY;  Surgeon: [facility] Messing III, MD;  Location: [facility];  Service: General;  Laterality: Left;  . BREAST LUMPECTOMY WITH RADIOACTIVE SEED LOCALIZATION Left 01/19/2016  . CARDIOVERSION  02/27/2011   Procedure: CARDIOVERSION;  Surgeon: [facility] Champagne, MD;  Location: [place];  Service: Cardiovascular;  Laterality: N/A;  . LAPAROSCOPIC CHOLECYSTECTOMY    . ROTATOR CUFF REPAIR Left   .  TUBAL LIGATION       Medications Prior to Admission: Prior to Admission medications   Medication Sig Start Date End Date Taking? Authorizing Provider  amLODipine (NORVASC) 5 MG tablet TAKE 1 TABLET(5 MG) BY MOUTH TWICE DAILY Patient taking differently: Take 5 mg by mouth 2 (two) times daily.  03/07/18  Yes Bhagat, Bhavinkumar, PA  anastrozole (ARIMIDEX) 1 MG tablet TAKE 1 TABLET(1 MG) BY MOUTH DAILY Patient taking differently: Take 1 mg by mouth daily.  11/20/17  Yes [facility] Lose, MD  apixaban (ELIQUIS) 5 MG TABS tablet Take 1 tablet (5 mg total) by mouth 2 (two) times daily. 11/16/16  Yes [facility] Lose, MD  BD PEN NEEDLE NANO U/F 32G X 4 MM MISC 1 application by Other route daily.  03/27/18  Yes [provider]  diphenhydramine-acetaminophen (TYLENOL PM) 25-500 MG TABS tablet Take 1 tablet by mouth at bedtime as needed (for sleep).   Yes [provider]  ferrous sulfate 325 (65 FE) MG tablet Take 325 mg by mouth daily with breakfast.     Yes [provider]  furosemide (LASIX) 20 MG tablet Take one tablet by mouth daily as needed for swelling or weight gain Patient taking differently: Take 20 mg by mouth See admin instructions. Take 20 mg by mouth once a day as needed for swelling or weight gain 01/02/18  Yes [facility] Blanks, MD  levothyroxine (SYNTHROID) 137 MCG tablet Take 137 mcg by mouth daily. 07/02/18  Yes [provider]  losartan (COZAAR) 100 MG tablet Take 100 mg by mouth daily. 07/02/18  Yes [provider]  metFORMIN (GLUCOPHAGE) 850 MG tablet Take 850 mg by mouth 2 (two) times daily with a meal.    Yes [provider]  Multiple Vitamins-Minerals (MULTIVITAMIN WITH MINERALS) tablet Take 1 tablet by mouth daily.     Yes [provider]  naproxen sodium (ALEVE) 220 MG tablet Take 220 mg by mouth 2 (two) times daily as needed (for pain or headaches).   Yes [provider]  ONE TOUCH ULTRA TEST test strip daily.   04/03/18  Yes [provider]  TOUJEO SOLOSTAR 300 UNIT/ML SOPN Inject 15 Units into the skin See admin instructions. Inject 15 units into the skin in the morning before breakfast and 15 units at bedtime 03/12/15  Yes [provider]  glimepiride (AMARYL) 2 MG tablet Take 2 mg by mouth daily with breakfast.  09/11/11   [provider]     Allergies:    Allergies  Allergen Reactions  . Bee Venom Shortness Of Breath, Nausea And Vomiting and Other (See Comments)    Makes the patient feel faint, also  . Penicillins Anaphylaxis, Hives, Swelling and Other (See Comments)    Has patient had a PCN reaction causing immediate rash, facial/tongue/throat swelling, SOB or lightheadedness with hypotension: Yes Has patient had a PCN reaction causing severe rash involving mucus membranes or skin necrosis: No Has  patient had a PCN reaction that required hospitalization: No Has patient had a PCN reaction occurring within the last 10 years: No If all of the above answers are "NO", then may proceed with Cephalosporin use.     Social History:   Social History   Socioeconomic History  . Marital status: Married    Spouse name: Not on file  . Number of children: 2  . Years of education: Not on file  . Highest education level: Not on file  Occupational History    Employer: OTHER    Comment: Worked at [employer]  . Financial resource strain: Not on file  . Food insecurity    Worry: Not on file    Inability: Not on file  . Transportation needs    Medical: Not on file    Non-medical: Not on file  Tobacco Use  . Smoking status: Never Smoker  . Smokeless tobacco: Never Used  Substance and Sexual Activity  . Alcohol use: No    Alcohol/week: 0.0 standard drinks  . Drug use: No  . Sexual activity: Not on file  Lifestyle  . Physical activity    Days per week: Not on file    Minutes per session: Not on file  . Stress: Not on file  Relationships  . Social Product manager on phone: Not on file    Gets together: Not on file    Attends religious service: Not on file    Active member of club or organization: Not on file    Attends meetings of clubs or organizations: Not on file    Relationship status: Not on file  . Intimate partner violence    Fear of current or ex partner: Not on file    Emotionally abused: Not on file    Physically abused: Not on file    Forced sexual activity: Not on file  Other Topics Concern  . Not on file  Social History Narrative   Patient since 42   Husband with prostate cancer   10-siblings-no cancer      Family History:   The patient's family history includes Breast cancer in her sister; Diabetes (age of onset: 85) in her mother; Heart failure (age of onset: 12) in her father; Pneumonia (age of onset: 4) in her mother. There is no history of Cancer.     Review of Systems    General:  No chills, fever, night sweats or weight changes.  Cardiovascular:  No chest pain, dyspnea on exertion, edema, orthopnea, palpitations, paroxysmal nocturnal dyspnea. Positive for dizziness and presyncope.  Dermatological: No rash, lesions/masses Respiratory: No cough, dyspnea Urologic: No hematuria, dysuria Abdominal:   No nausea, vomiting, diarrhea, bright red blood per rectum, melena, or hematemesis Neurologic:  No visual changes, wkns, changes in mental status. All other systems reviewed and are otherwise negative except as noted above.  Physical Exam    Vitals:   11/30/18 1142 11/30/18 1519  BP: (!) 159/47 140/61  Pulse: (!) 49 (!) 45  Resp: 16 18  Temp: 98 F (36.7 C)   TempSrc: Oral   SpO2: 100% 100%    Intake/Output Summary (Last 24 hours) at 11/30/2018 1539 Last data filed at 11/30/2018 1519 Gross per 24 hour  Intake 0 ml  Output 0 ml  Net 0 ml   There were no vitals filed for this visit. There is no height or weight on file to calculate BMI.   General: Well developed, well nourished,female  in no acute  distress. Head: Normocephalic, atraumatic, sclera non-icteric, no xanthomas, nares are without discharge. Dentition:  Neck: No carotid bruits. JVD not elevated.  Lungs: Respirations regular and unlabored, without wheezes or rales.  Heart: Irregularly irregular, bradycardiac. No S3 or S4.  No murmur, no rubs, or gallops appreciated. Abdomen: Soft, non-tender, non-distended with normoactive bowel sounds. No hepatomegaly. No rebound/guarding. No obvious abdominal masses. Msk:  Strength and tone appear normal for age. No joint deformities or effusions. Extremities: No clubbing or cyanosis. Trace lower extremity edema.  Distal pedal pulses are 2+ bilaterally. Neuro: Alert and oriented X 3. Moves all extremities spontaneously. No focal deficits noted. Psych:  Responds to questions appropriately with a normal affect. Skin: No rashes or lesions noted  Labs and Radiology Studies    EKG:  The ECG that was done was personally reviewed and demonstrates atrial fibrillation with slow ventricular response, heart rate 46, with borderline RAD and no acute ST changes.    Relevant CV Studies:  Echocardiogram: 07/2017 Study Conclusions  - Left ventricle: The cavity size was normal. Wall thickness was   increased in a pattern of mild LVH. Systolic function was normal.   The estimated ejection fraction was in the range of 55% to 60%.   Wall motion was normal; there were no regional wall motion   abnormalities. - Mitral valve: Mildly to moderately calcified annulus. Mildly   thickened leaflets . - Left atrium: The atrium was mildly dilated. - Pulmonary arteries: Systolic pressure was mildly increased. PA   peak pressure: 36 mm Hg (S).   Laboratory Data:  Chemistry Recent Labs  Lab 11/30/18 1151  NA 138  K 4.8  CL 106  CO2 21*  GLUCOSE 167*  BUN 29*  CREATININE 1.62*  CALCIUM 9.3  GFRNONAA 29*  GFRAA 34*  ANIONGAP 11    No results for input(s): PROT, ALBUMIN, AST, ALT, ALKPHOS, BILITOT  in the last 168 hours. Hematology Recent Labs  Lab 11/30/18 1151  WBC 11.6*  RBC 3.52*  HGB 10.8*  HCT 34.3*  MCV 97.4  MCH 30.7  MCHC 31.5  RDW 13.3  PLT 286   Cardiac EnzymesNo results for input(s): TROPONINI in the last 168 hours. No results for input(s): TROPIPOC in the last 168 hours.  BNPNo results for input(s): BNP, PROBNP in the last 168 hours.  DDimer No results for input(s): DDIMER in the last 168 hours.  Radiology/Studies:  Dg Chest 2 View  Result Date: 11/30/2018 CLINICAL DATA:  Shortness of breath and bradycardia for week. EXAM: CHEST - 2 VIEW COMPARISON:  Chest x-rays dated 11/21/2018 and 04/11/2018. FINDINGS: Stable cardiomegaly. Lungs are clear. No pleural effusion or pneumothorax is seen. No acute appearing osseous abnormality. Chronic degenerative spondylosis of the thoracic spine, mild to moderate in degree. IMPRESSION: 1. No active cardiopulmonary disease. No evidence of pneumonia or pulmonary edema. 2. Stable cardiomegaly. Electronically Signed   By: Franki Cabot M.D.   On: 11/30/2018 12:34    Assessment and Plan:   1. Bradycardia/ Permanent Atrial Fibrillation - She has known atrial fibrillation with slow ventricular response and was recently admitted for syncope and presented back today for worsening dyspnea with exertion and dizziness with heart rate sustaining in the 40's at home, even with activity per the patient's report. - Electrolytes within normal limits. TSH 0.781 when checked on 9/10. Hgb 10.8 which is similar to values during her prior admission.  - The concern is that she has a component of chronotropic incompetence. I  suspect she would be unable to walk on a treadmill to assess for this as she uses a cane at baseline.  Would attempt to ambulate the patient in the hallway to assess heart rate response with this. Will ask EP to see tomorrow for further evaluation in regards to possible PPM placement. - she is not on any AV nodal blocking agents.  Continue PTA Eliquis for anticoagulation.   2. Chronic Diastolic CHF - She denies any recent orthopnea, PND, or lower extremity edema and weight has been stable on her home scales. BNP pending but she does not appear significantly volume overloaded by examination.   3. HTN -BP has been elevated at 140/61 -159/47 while in the ED.  She did not take any of her morning medications. Will resume PTA Amlodipine and Losartan upon admission.   4. IDDM - Hgb A1c 6.9 when checked earlier this month. Continue PTA Insulin regimen. Will hold Metformin.   5. Stage 3 CKD - creatinine stable at 1.62 which is close to her baseline.    For questions or updates, please contact [place] Please consult [email or web address] for contact info under Cardiology/STEMI.   Signed, Erma Heritage, PA-C 11/30/2018, 3:39 PM Pager: [phone]    Attending note:  Patient seen and examined, I agree with the evaluation of Ms. Strader PA-C.  Records reviewed.  Ms. Dorce presents to the ER describing significant shortness of breath when walking from room to room in her house and other basic chores.  She has been checking her pulse with a pulse oximeter during activity and states it has been in the 40s without substantial elevations.  She was recently hospitalized after an episode of syncope that was in association with orthostasis but also sinus bradycardia with heart rates in the 40s to 50s.  She was seen by EP in consultation and it was not felt that a pacemaker was indicated at that time.  She has permanent atrial fibrillation at baseline.  On evaluation in the ER heart rates have been as low as the mid 30s into the 40s at rest and in atrial fibrillation.  She is in no distress.  She is not on any AV nodal blockers at this time.  ECG shows slow atrial fibrillation.  Her electrolytes are within normal limits and recent TSH was also normal.  Patient is being admitted for reevaluation by EP, will be seen by Dr.  Lovena Le tomorrow.  Main question is whether her symptoms are related to bradycardia and whether chronotropic incompetence might respond to placement of a pacemaker.  She is using a cane at this time, not clear that she would be able to navigate a treadmill although this would be a consideration.  She could also ambulate on telemetry in the hall to see if heart rate increases significantly.  Satira Sark, M.D., F.A.C.C.

## 2018-11-30 NOTE — ED Notes (Signed)
ED TO INPATIENT HANDOFF REPORT  ED Nurse Name and Phone #: William Hamburger, RN [phone]  S Name/Age/Gender Summer Hawkins Console 84 y.o. female Room/Bed: 045C/045C  Code Status   Code Status: Prior  Home/SNF/Other Home Patient oriented to: self, place, time and situation Is this baseline? Yes   Triage Complete: Triage complete  Chief Complaint sob  Triage Note Pt endorses shob and bradycardia x 1 week. Unable to take 3 steps with getting shob. Pt has seen a cardiologist due to HR in the 40s and supposed to have more tests and possibly a pacemaker. Axox4. HR 49 in triage. Denies CP.    Allergies Allergies  Allergen Reactions  . Bee Venom Shortness Of Breath, Nausea And Vomiting and Other (See Comments)    Makes the patient feel faint, also  . Penicillins Anaphylaxis, Hives, Swelling and Other (See Comments)    Has patient had a PCN reaction causing immediate rash, facial/tongue/throat swelling, SOB or lightheadedness with hypotension: Yes Has patient had a PCN reaction causing severe rash involving mucus membranes or skin necrosis: No Has patient had a PCN reaction that required hospitalization: No Has patient had a PCN reaction occurring within the last 10 years: No If all of the above answers are "NO", then may proceed with Cephalosporin use.     Level of Care/Admitting Diagnosis ED Disposition    ED Disposition Condition [facility] Area: [place] [[id number]]  Level of Care: Telemetry Cardiac [103]  Covid Evaluation: Confirmed COVID Negative  Diagnosis: Symptomatic bradycardia [id number]  Admitting Physician: MCDOWELL, Aloha Gell [[id number]]  Attending Physician: Satira Sark [[id number]]  Estimated length of stay: past midnight tomorrow  Certification:: I certify this patient will need inpatient services for at least 2 midnights  PT Class (Do Not Modify): Inpatient [101]  PT Acc Code (Do Not Modify): Private [1]       B Medical/Surgery History Past  Medical History:  Diagnosis Date  . Atrial fibrillation ([place])   . Breast cancer ([place])   . Breast cancer, left ([place])   . Chronic kidney disease    stage III - patient was unaware  . Diabetes mellitus   . Dyspnea   . Dysrhythmia    Afib  . H/O: hysterectomy   . History of colonoscopy 01/25/2010  . History of mammogram 08/04/2009  . Hyperlipidemia   . Hypertension   . Hypothyroidism   . Ketoacidosis, diabetic, no coma, non-insulin dependent    Type II  . Vitamin B12 deficiency    Past Surgical History:  Procedure Laterality Date  . ABDOMINAL HYSTERECTOMY    . Bilateral foot surgery    . BREAST EXCISIONAL BIOPSY    . BREAST LUMPECTOMY Left    2017  . BREAST LUMPECTOMY WITH RADIOACTIVE SEED AND SENTINEL LYMPH NODE BIOPSY Left 01/19/2016   Procedure: LEFT BREAST LUMPECTOMY WITH RADIOACTIVE SEED AND SENTINEL LYMPH NODE BIOPSY;  Surgeon: Autumn Messing III, MD;  Location: [place];  Service: General;  Laterality: Left;  . BREAST LUMPECTOMY WITH RADIOACTIVE SEED LOCALIZATION Left 01/19/2016  . CARDIOVERSION  02/27/2011   Procedure: CARDIOVERSION;  Surgeon: Loralie Champagne, MD;  Location: [place];  Service: Cardiovascular;  Laterality: N/A;  . LAPAROSCOPIC CHOLECYSTECTOMY    . ROTATOR CUFF REPAIR Left   . TUBAL LIGATION       A IV Location/Drains/Wounds Patient Lines/Drains/Airways Status   Active Line/Drains/Airways    Name:   Placement date:   Placement time:   Site:  Days:   Peripheral IV 11/30/18 Right;Posterior Forearm   11/30/18    1544    Forearm   less than 1          Intake/Output Last 24 hours  Intake/Output Summary (Last 24 hours) at 11/30/2018 1828 Last data filed at 11/30/2018 1519 Gross per 24 hour  Intake 0 ml  Output 0 ml  Net 0 ml    Labs/Imaging Results for orders placed or performed during the hospital encounter of 11/30/18 (from the past 48 hour(s))  Basic metabolic panel     Status: Abnormal   Collection Time: 11/30/18 11:51 AM  Result Value Ref Range    Sodium 138 135 - 145 mmol/L   Potassium 4.8 3.5 - 5.1 mmol/L   Chloride 106 98 - 111 mmol/L   CO2 21 (L) 22 - 32 mmol/L   Glucose, Bld 167 (H) 70 - 99 mg/dL   BUN 29 (H) 8 - 23 mg/dL   Creatinine, Ser 1.62 (H) 0.44 - 1.00 mg/dL   Calcium 9.3 8.9 - 10.3 mg/dL   GFR calc non Af Amer 29 (L) >60 mL/min   GFR calc Af Amer 34 (L) >60 mL/min   Anion gap 11 5 - 15    Comment: Performed at [facility], [address] [place], [place] [address]  CBC     Status: Abnormal   Collection Time: 11/30/18 11:51 AM  Result Value Ref Range   WBC 11.6 (H) 4.0 - 10.5 K/uL   RBC 3.52 (L) 3.87 - 5.11 MIL/uL   Hemoglobin 10.8 (L) 12.0 - 15.0 g/dL   HCT 34.3 (L) 36.0 - 46.0 %   MCV 97.4 80.0 - 100.0 fL   MCH 30.7 26.0 - 34.0 pg   MCHC 31.5 30.0 - 36.0 g/dL   RDW 13.3 11.5 - 15.5 %   Platelets 286 150 - 400 K/uL   nRBC 0.0 0.0 - 0.2 %    Comment: Performed at [facility], [place] [address], [place], [place] [address]  Troponin I (High Sensitivity)     Status: Abnormal   Collection Time: 11/30/18 11:51 AM  Result Value Ref Range   Troponin I (High Sensitivity) 22 (H) <18 ng/L    Comment: (NOTE) Elevated high sensitivity troponin I (hsTnI) values and significant  changes across serial measurements may suggest ACS but many other  chronic and acute conditions are known to elevate hsTnI results.  Refer to the "Links" section for chest pain algorithms and additional  guidance. Performed at [facility], [place] [address], [place], [place] [address]   SARS Coronavirus 2 [facility] order, Performed in [place] [facility] lab) Nasopharyngeal Nasopharyngeal Swab     Status: None   Collection Time: 11/30/18  2:50 PM   Specimen: Nasopharyngeal Swab  Result Value Ref Range   SARS Coronavirus 2 NEGATIVE NEGATIVE    Comment: (NOTE) If result is NEGATIVE SARS-CoV-2 target nucleic acids are NOT DETECTED. The SARS-CoV-2 RNA is generally detectable in upper and lower  respiratory specimens during  the acute phase of infection. The lowest  concentration of SARS-CoV-2 viral copies this assay can detect is 250  copies / mL. A negative result does not preclude SARS-CoV-2 infection  and should not be used as the sole basis for treatment or other  patient management decisions.  A negative result may occur with  improper specimen collection / handling, submission of specimen other  than nasopharyngeal swab, presence of viral mutation(s) within the  areas targeted by this assay, and inadequate number of viral copies  (<250 copies / mL). A negative result must be combined with clinical  observations, patient history, and epidemiological information. If result is POSITIVE SARS-CoV-2 target nucleic acids are DETECTED. The SARS-CoV-2 RNA is generally detectable in upper and lower  respiratory specimens dur ing the acute phase of infection.  Positive  results are indicative of active infection with SARS-CoV-2.  Clinical  correlation with patient history and other diagnostic information is  necessary to determine patient infection status.  Positive results do  not rule out bacterial infection or co-infection with other viruses. If result is PRESUMPTIVE POSTIVE SARS-CoV-2 nucleic acids MAY BE PRESENT.   A presumptive positive result was obtained on the submitted specimen  and confirmed on repeat testing.  While 2019 novel coronavirus  (SARS-CoV-2) nucleic acids may be present in the submitted sample  additional confirmatory testing may be necessary for epidemiological  and / or clinical management purposes  to differentiate between  SARS-CoV-2 and other Sarbecovirus currently known to infect humans.  If clinically indicated additional testing with an alternate test  methodology [id number]) is advised. The SARS-CoV-2 RNA is generally  detectable in upper and lower respiratory sp ecimens during the acute  phase of infection. The expected result is Negative. Fact Sheet for Patients:   StrictlyIdeas.no Fact Sheet for Healthcare Providers: BankingDealers.co.za This test is not yet approved or cleared by the [place] FDA and has been authorized for detection and/or diagnosis of SARS-CoV-2 by FDA under an Emergency Use Authorization (EUA).  This EUA will remain in effect (meaning this test can be used) for the duration of the COVID-19 declaration under Section 564(b)(1) of the Act, 21 U.S.C. section 360bbb-3(b)(1), unless the authorization is terminated or revoked sooner. Performed at [facility], [place] [address] [place], [place] [address]   Urinalysis, Routine w reflex microscopic     Status: Abnormal   Collection Time: 11/30/18  3:23 PM  Result Value Ref Range   Color, Urine YELLOW YELLOW   APPearance CLEAR CLEAR   Specific Gravity, Urine 1.015 1.005 - 1.030   pH 5.0 5.0 - 8.0   Glucose, UA NEGATIVE NEGATIVE mg/dL   Hgb urine dipstick NEGATIVE NEGATIVE   Bilirubin Urine NEGATIVE NEGATIVE   Ketones, ur NEGATIVE NEGATIVE mg/dL   Protein, ur 100 (A) NEGATIVE mg/dL   Nitrite NEGATIVE NEGATIVE   Leukocytes,Ua TRACE (A) NEGATIVE   RBC / HPF 0-5 0 - 5 RBC/hpf   WBC, UA 6-10 0 - 5 WBC/hpf   Bacteria, UA NONE SEEN NONE SEEN   Squamous Epithelial / LPF 0-5 0 - 5   Mucus PRESENT     Comment: Performed at [facility], [place] [address] [place], [place] [address]  Troponin I (High Sensitivity)     Status: Abnormal   Collection Time: 11/30/18  5:24 PM  Result Value Ref Range   Troponin I (High Sensitivity) 18 (H) <18 ng/L    Comment: (NOTE) Elevated high sensitivity troponin I (hsTnI) values and significant  changes across serial measurements may suggest ACS but many other  chronic and acute conditions are known to elevate hsTnI results.  Refer to the "Links" section for chest pain algorithms and additional  guidance. Performed at [facility], [place] [address], [place], [place] [address]    Dg Chest  2 View  Result Date: 11/30/2018 CLINICAL DATA:  Shortness of breath and bradycardia for week. EXAM: CHEST - 2 VIEW COMPARISON:  Chest x-rays dated 11/21/2018 and 04/11/2018. FINDINGS: Stable cardiomegaly. Lungs are clear. No pleural effusion or pneumothorax is seen. No acute appearing osseous abnormality. Chronic degenerative spondylosis of the thoracic spine, mild to moderate in degree. IMPRESSION: 1. No active cardiopulmonary disease. No evidence of pneumonia or pulmonary edema. 2. Stable cardiomegaly. Electronically Signed   By: Franki Cabot M.D.   On: 11/30/2018 12:34    Pending Labs Unresulted Labs (From admission, onward)    Start     Ordered   11/30/18 1344  Brain natriuretic peptide  Once,   STAT     11/30/18 1343   Signed and Held  Basic metabolic panel  Tomorrow morning,   R     Signed and Held          Vitals/Pain Today's Vitals   11/30/18 1143 11/30/18 1517 11/30/18 1519 11/30/18 1746  BP:   140/61 (!) 149/40  Pulse:   (!) 45 (!) 50  Resp:   18 14  Temp:      TempSrc:      SpO2:   100% 100%  PainSc: 0-No pain 5   0-No pain    Isolation Precautions No active isolations  Medications Medications - No data to display  Mobility walks with device Moderate fall risk   Focused Assessments Cardiac Assessment Handoff:  Cardiac Rhythm: Sinus bradycardia Lab Results  Component Value Date   TROPONINI <0.03 09/26/2017   No results found for: DDIMER Does the Patient currently have chest pain? No     R Recommendations: See Admitting Provider Note  Report given to:   Additional Notes:

## 2018-12-01 DIAGNOSIS — I482 Chronic atrial fibrillation, unspecified: Secondary | ICD-10-CM

## 2018-12-01 LAB — BASIC METABOLIC PANEL
Anion gap: 11 (ref 5–15)
BUN: 26 mg/dL — ABNORMAL HIGH (ref 8–23)
CO2: 22 mmol/L (ref 22–32)
Calcium: 9 mg/dL (ref 8.9–10.3)
Chloride: 106 mmol/L (ref 98–111)
Creatinine, Ser: 1.39 mg/dL — ABNORMAL HIGH (ref 0.44–1.00)
GFR calc Af Amer: 41 mL/min — ABNORMAL LOW (ref >60)
GFR calc non Af Amer: 35 mL/min — ABNORMAL LOW (ref >60)
Glucose, Bld: 148 mg/dL — ABNORMAL HIGH (ref 70–99)
Potassium: 4.4 mmol/L (ref 3.5–5.1)
Sodium: 139 mmol/L (ref 135–145)

## 2018-12-01 LAB — GLUCOSE, CAPILLARY
Glucose-Capillary: 142 mg/dL — ABNORMAL HIGH (ref 70–99)
Glucose-Capillary: 182 mg/dL — ABNORMAL HIGH (ref 70–99)
Glucose-Capillary: 242 mg/dL — ABNORMAL HIGH (ref 70–99)
Glucose-Capillary: 175 mg/dL — ABNORMAL HIGH (ref 70–99)

## 2018-12-01 MED ORDER — FUROSEMIDE 40 MG PO TABS
40.0000 mg | ORAL_TABLET | Freq: Once | ORAL | Status: AC
  Administered 2018-12-01: 13:00:00 40 mg via ORAL
  Filled 2018-12-01: qty 1

## 2018-12-01 MED ORDER — DIPHENHYDRAMINE HCL 25 MG PO CAPS
25.0000 mg | ORAL_CAPSULE | Freq: Every evening | ORAL | Status: DC | PRN
  Administered 2018-12-01: 25 mg via ORAL
  Filled 2018-12-01: qty 1

## 2018-12-01 MED ORDER — INFLUENZA VAC A&B SA ADJ QUAD 0.5 ML IM PRSY
0.5000 mL | PREFILLED_SYRINGE | INTRAMUSCULAR | Status: AC
  Administered 2018-12-02: 0.5 mL via INTRAMUSCULAR
  Filled 2018-12-01: qty 0.5

## 2018-12-01 MED ORDER — INSULIN ASPART 100 UNIT/ML ~~LOC~~ SOLN
0.0000 [IU] | Freq: Three times a day (TID) | SUBCUTANEOUS | Status: DC
  Administered 2018-12-01: 5 [IU] via SUBCUTANEOUS
  Administered 2018-12-02: 2 [IU] via SUBCUTANEOUS
  Administered 2018-12-03: 3 [IU] via SUBCUTANEOUS

## 2018-12-01 MED ORDER — APIXABAN 5 MG PO TABS
5.0000 mg | ORAL_TABLET | Freq: Two times a day (BID) | ORAL | Status: DC
  Administered 2018-12-01: 5 mg via ORAL
  Filled 2018-12-01: qty 1

## 2018-12-01 MED ORDER — DIPHENHYDRAMINE-APAP (SLEEP) 25-500 MG PO TABS: 1.0000 | ORAL_TABLET | Freq: Every evening | ORAL | Status: DC | PRN

## 2018-12-01 NOTE — Progress Notes (Addendum)
Page to B.Strader,PA for cardiology to notify of pitting edema and no lasix ordered, as well as to request CBG QAM order.  And relay pt request for Tylenol PM order per her home routine.

## 2018-12-01 NOTE — Progress Notes (Signed)
Patient resting comfortably during shift report. Denies complaints.  

## 2018-12-01 NOTE — Consult Note (Signed)
Cardiology Consultation:   Patient ID: SHAKAYLA HOLLRAH MRN: [id number]; DOB: Apr 22, 1934  Admit date: 11/30/2018 Date of Consult: 12/01/2018  Primary Care Provider: Haywood Pao, MD Primary Cardiologist: Lauree Chandler, MD  Primary Electrophysiologist:   Curt Bears   Patient Profile:   KYNNLEE DARRELL is a 84 y.o. female with a hx of chronic atrial fib and bradycardia who is being seen today for the evaluation of symoptomatic bradycardia at the request of Dr. Domenic Polite.  History of Present Illness:   Ms. Zellar is a very pleasant 84 year old woman with a history of permanent atrial fibrillation, chronic diastolic heart failure, hypertension, dyslipidemia, chronic renal insufficiency and obesity.  She saw my partner Dr. Curt Bears a couple of weeks ago and was bradycardic with heart rates in the 40s and 50s.  Over the last several days, she has noted worsening bradycardia with heart rates on her pulse oximeter at home in the 30s and low 40s.  She called yesterday stating that with exertion her heart rate would not get above 50.  She presents to the emergency room for additional evaluation.  She describes heart failure symptoms with exertion.  She has been treated with diuretic therapy but had persistence of peripheral edema and dyspnea with exertion.  She has not had frank syncope.  She does experience dizziness and lightheadedness in the setting of heart rates in the 40s.  Heart Pathway Score:     Past Medical History:  Diagnosis Date  . Atrial fibrillation ([place])   . Breast cancer ([place])   . Breast cancer, left ([place])   . Chronic kidney disease    stage III - patient was unaware  . Diabetes mellitus   . Dyspnea   . Dysrhythmia    Afib  . H/O: hysterectomy   . History of colonoscopy 01/25/2010  . History of mammogram 08/04/2009  . Hyperlipidemia   . Hypertension   . Hypothyroidism   . Ketoacidosis, diabetic, no coma, non-insulin dependent    Type II  . Vitamin B12 deficiency      Past Surgical History:  Procedure Laterality Date  . ABDOMINAL HYSTERECTOMY    . Bilateral foot surgery    . BREAST EXCISIONAL BIOPSY    . BREAST LUMPECTOMY Left    2017  . BREAST LUMPECTOMY WITH RADIOACTIVE SEED AND SENTINEL LYMPH NODE BIOPSY Left 01/19/2016   Procedure: LEFT BREAST LUMPECTOMY WITH RADIOACTIVE SEED AND SENTINEL LYMPH NODE BIOPSY;  Surgeon: Autumn Messing III, MD;  Location: [place];  Service: General;  Laterality: Left;  . BREAST LUMPECTOMY WITH RADIOACTIVE SEED LOCALIZATION Left 01/19/2016  . CARDIOVERSION  02/27/2011   Procedure: CARDIOVERSION;  Surgeon: Loralie Champagne, MD;  Location: [place];  Service: Cardiovascular;  Laterality: N/A;  . LAPAROSCOPIC CHOLECYSTECTOMY    . ROTATOR CUFF REPAIR Left   . TUBAL LIGATION         Inpatient Medications: Scheduled Meds: . amLODipine  5 mg Oral BID  . anastrozole  1 mg Oral Daily  . apixaban  5 mg Oral BID  . ferrous sulfate  325 mg Oral Q breakfast  . [START ON 12/02/2018] influenza vaccine adjuvanted  0.5 mL Intramuscular Tomorrow-1000  . insulin aspart  0-15 Units Subcutaneous TID WC  . insulin glargine  15 Units Subcutaneous BID AC & HS  . levothyroxine  137 mcg Oral Q0600  . losartan  100 mg Oral Daily   Continuous Infusions:  PRN Meds: diphenhydrAMINE  Allergies:    Allergies  Allergen Reactions  . Bee Venom  Shortness Of Breath, Nausea And Vomiting and Other (See Comments)    Makes the patient feel faint, also  . Penicillins Anaphylaxis, Hives, Swelling and Other (See Comments)    Has patient had a PCN reaction causing immediate rash, facial/tongue/throat swelling, SOB or lightheadedness with hypotension: Yes Has patient had a PCN reaction causing severe rash involving mucus membranes or skin necrosis: No Has patient had a PCN reaction that required hospitalization: No Has patient had a PCN reaction occurring within the last 10 years: No If all of the above answers are "NO", then may proceed with Cephalosporin  use.     Social History:   Social History   Socioeconomic History  . Marital status: Married    Spouse name: Not on file  . Number of children: 2  . Years of education: Not on file  . Highest education level: Not on file  Occupational History    Employer: OTHER    Comment: Worked at [employer]  . Financial resource strain: Not on file  . Food insecurity    Worry: Not on file    Inability: Not on file  . Transportation needs    Medical: Not on file    Non-medical: Not on file  Tobacco Use  . Smoking status: Never Smoker  . Smokeless tobacco: Never Used  Substance and Sexual Activity  . Alcohol use: No    Alcohol/week: 0.0 standard drinks  . Drug use: No  . Sexual activity: Not on file  Lifestyle  . Physical activity    Days per week: Not on file    Minutes per session: Not on file  . Stress: Not on file  Relationships  . Social Herbalist on phone: Not on file    Gets together: Not on file    Attends religious service: Not on file    Active member of club or organization: Not on file    Attends meetings of clubs or organizations: Not on file    Relationship status: Not on file  . Intimate partner violence    Fear of current or ex partner: Not on file    Emotionally abused: Not on file    Physically abused: Not on file    Forced sexual activity: Not on file  Other Topics Concern  . Not on file  Social History Narrative   Patient since 74   Husband with prostate cancer   10-siblings-no cancer    Family History:    Family History  Problem Relation Age of Onset  . Heart failure Father 56       enlarged heart  . Pneumonia Mother 64  . Diabetes Mother 84  . Breast cancer Sister   . Cancer Neg Hx      ROS:  Please see the history of present illness.   All other ROS reviewed and negative.     Physical Exam/Data:   Vitals:   12/01/18 0027 12/01/18 0505 12/01/18 0754 12/01/18 1209  BP: (!) 154/62 (!) 156/61 (!) 170/63 (!) 174/61   Pulse: (!) 44 (!) 50 (!) 51 (!) 44  Resp: 18 18 20 20   Temp: 98.1 F (36.7 C) 97.9 F (36.6 C) (!) 97.5 F (36.4 C) 99.5 F (37.5 C)  TempSrc: Oral Oral Oral Oral  SpO2: 97% 96% 100% 99%  Weight:  105.2 kg    Height:        Intake/Output Summary (Last 24 hours) at 12/01/2018 1450 Last data filed at  12/01/2018 1255 Gross per 24 hour  Intake 942 ml  Output 800 ml  Net 142 ml   Last 3 Weights 12/01/2018 11/30/2018 11/23/2018  Weight (lbs) 232 lb 231 lb 9.6 oz 227 lb 3.2 oz  Weight (kg) 105.235 kg 105.053 kg 103.057 kg     Body mass index is 39.82 kg/m.  General:  Well nourished, overweight, elderly appearing, well developed, in no acute distress HEENT: normal Lymph: no adenopathy Neck: 7 cm JVD Endocrine:  No thryomegaly Vascular: No carotid bruits; FA pulses 2+ bilaterally without bruits  Cardiac:  normal S1, S2; IRRR; no murmur, bradycardic Lungs:  clear to auscultation bilaterally, no wheezing, rhonchi or rales  Abd: soft, nontender, no hepatomegaly  Ext: 2+ peripheral edema Musculoskeletal:  No deformities, BUE and BLE strength normal and equal Skin: warm and dry  Neuro:  CNs 2-12 intact, no focal abnormalities noted Psych:  Normal affect   EKG:  The EKG was personally reviewed and demonstrates: Atrial fibrillation with a very slow ventricular response Telemetry:  Telemetry was personally reviewed and demonstrates: Atrial fibrillation with a slow ventricular response  Relevant CV Studies: None  Laboratory Data:  High Sensitivity Troponin:   Recent Labs  Lab 11/30/18 1151 11/30/18 1724  TROPONINIHS 22* 18*     Chemistry Recent Labs  Lab 11/30/18 1151 12/01/18 0555  NA 138 139  K 4.8 4.4  CL 106 106  CO2 21* 22  GLUCOSE 167* 148*  BUN 29* 26*  CREATININE 1.62* 1.39*  CALCIUM 9.3 9.0  GFRNONAA 29* 35*  GFRAA 34* 41*  ANIONGAP 11 11    No results for input(s): PROT, ALBUMIN, AST, ALT, ALKPHOS, BILITOT in the last 168 hours. Hematology Recent Labs   Lab 11/30/18 1151  WBC 11.6*  RBC 3.52*  HGB 10.8*  HCT 34.3*  MCV 97.4  MCH 30.7  MCHC 31.5  RDW 13.3  PLT 286   BNP Recent Labs  Lab 11/30/18 2053  BNP 349.0*    DDimer No results for input(s): DDIMER in the last 168 hours.   Radiology/Studies:  Dg Chest 2 View  Result Date: 11/30/2018 CLINICAL DATA:  Shortness of breath and bradycardia for week. EXAM: CHEST - 2 VIEW COMPARISON:  Chest x-rays dated 11/21/2018 and 04/11/2018. FINDINGS: Stable cardiomegaly. Lungs are clear. No pleural effusion or pneumothorax is seen. No acute appearing osseous abnormality. Chronic degenerative spondylosis of the thoracic spine, mild to moderate in degree. IMPRESSION: 1. No active cardiopulmonary disease. No evidence of pneumonia or pulmonary edema. 2. Stable cardiomegaly. Electronically Signed   By: Franki Cabot M.D.   On: 11/30/2018 12:34    Assessment and Plan:   1. Atrial fibrillation with a slow ventricular response -at this point, the patient appears to be symptomatic.  She is on no AV nodal blocking drugs.  While her resting heart rate is in the low 40s, it appears that she is unable to increase her exertional heart rate based on her history.  I discussed the risks, benefits, goals and expectations of permanent pacemaker insertion and she would like to proceed.  We will tentatively schedule this on 12/02/2018. 2. Chronic diastolic heart failure -her symptoms are class II, approaching class III.  Hopefully with pacemaker insertion and up titration of her diuretic therapy, her symptoms will improve. 3. Hypertension -once her pacemaker is in place, her medications will be uptitrated as needed.   For questions or updates, please contact [place] Please consult [email or web address] for contact info under  Signed, Cristopher Peru, MD  12/01/2018 2:50 PM

## 2018-12-02 ENCOUNTER — Encounter (HOSPITAL_COMMUNITY)
Admission: EM | Disposition: A | Discharge status: Home / Self Care | Payer: Self-pay | Source: Home / Self Care | Attending: Cardiovascular Disease

## 2018-12-02 ENCOUNTER — Telehealth: Payer: Self-pay | Admitting: *Deleted

## 2018-12-02 DIAGNOSIS — I4819 Other persistent atrial fibrillation: Secondary | ICD-10-CM

## 2018-12-02 DIAGNOSIS — I441 Atrioventricular block, second degree: Secondary | ICD-10-CM

## 2018-12-02 HISTORY — PX: PACEMAKER IMPLANT: EP1218

## 2018-12-02 LAB — SURGICAL PCR SCREEN
MRSA, PCR: NEGATIVE
Staphylococcus aureus: NEGATIVE

## 2018-12-02 LAB — CBC
HCT: 31.3 % — ABNORMAL LOW (ref 36.0–46.0)
Hemoglobin: 10.2 g/dL — ABNORMAL LOW (ref 12.0–15.0)
MCH: 30.8 pg (ref 26.0–34.0)
MCHC: 32.6 g/dL (ref 30.0–36.0)
MCV: 94.6 fL (ref 80.0–100.0)
Platelets: 247 10*3/uL (ref 150–400)
RBC: 3.31 MIL/uL — ABNORMAL LOW (ref 3.87–5.11)
RDW: 13.2 % (ref 11.5–15.5)
WBC: 10.9 10*3/uL — ABNORMAL HIGH (ref 4.0–10.5)
nRBC: 0 % (ref 0.0–0.2)

## 2018-12-02 LAB — GLUCOSE, CAPILLARY
Glucose-Capillary: 148 mg/dL — ABNORMAL HIGH (ref 70–99)
Glucose-Capillary: 128 mg/dL — ABNORMAL HIGH (ref 70–99)
Glucose-Capillary: 118 mg/dL — ABNORMAL HIGH (ref 70–99)
Glucose-Capillary: 281 mg/dL — ABNORMAL HIGH (ref 70–99)

## 2018-12-02 LAB — MAGNESIUM: Magnesium: 1.8 mg/dL (ref 1.7–2.4)

## 2018-12-02 SURGERY — PACEMAKER IMPLANT
Anesthesia: LOCAL

## 2018-12-02 MED ORDER — SODIUM CHLORIDE 0.9% FLUSH: 3.0000 mL | INTRAVENOUS | Status: DC | PRN

## 2018-12-02 MED ORDER — CHLORHEXIDINE GLUCONATE 4 % EX LIQD
60.0000 mL | Freq: Once | CUTANEOUS | Status: AC
  Administered 2018-12-02: 4 via TOPICAL
  Filled 2018-12-02: qty 60

## 2018-12-02 MED ORDER — LIDOCAINE HCL (PF) 1 % IJ SOLN
INTRAMUSCULAR | Status: AC
  Filled 2018-12-02: qty 90

## 2018-12-02 MED ORDER — HEPARIN (PORCINE) IN NACL 1000-0.9 UT/500ML-% IV SOLN
INTRAVENOUS | Status: DC | PRN
  Administered 2018-12-02: 500 mL

## 2018-12-02 MED ORDER — SODIUM CHLORIDE 0.9 % IV SOLN
80.0000 mg | INTRAVENOUS | Status: AC
  Administered 2018-12-02: 80 mg
  Filled 2018-12-02: qty 2

## 2018-12-02 MED ORDER — MIDAZOLAM HCL 5 MG/5ML IJ SOLN
INTRAMUSCULAR | Status: DC | PRN
  Administered 2018-12-02 (×3): 1 mg via INTRAVENOUS

## 2018-12-02 MED ORDER — ACETAMINOPHEN 325 MG PO TABS: 325.0000 mg | ORAL_TABLET | ORAL | Status: DC | PRN

## 2018-12-02 MED ORDER — MIDAZOLAM HCL 5 MG/5ML IJ SOLN
INTRAMUSCULAR | Status: AC
  Filled 2018-12-02: qty 5

## 2018-12-02 MED ORDER — FENTANYL CITRATE (PF) 100 MCG/2ML IJ SOLN
INTRAMUSCULAR | Status: AC
  Filled 2018-12-02: qty 2

## 2018-12-02 MED ORDER — HEPARIN (PORCINE) IN NACL 1000-0.9 UT/500ML-% IV SOLN
INTRAVENOUS | Status: AC
  Filled 2018-12-02: qty 500

## 2018-12-02 MED ORDER — ONDANSETRON HCL 4 MG/2ML IJ SOLN: 4.0000 mg | Freq: Four times a day (QID) | INTRAMUSCULAR | Status: DC | PRN

## 2018-12-02 MED ORDER — SODIUM CHLORIDE 0.9 % IV SOLN
INTRAVENOUS | Status: DC
  Administered 2018-12-02: 10:00:00 via INTRAVENOUS

## 2018-12-02 MED ORDER — VANCOMYCIN HCL IN DEXTROSE 1-5 GM/200ML-% IV SOLN
1000.0000 mg | Freq: Two times a day (BID) | INTRAVENOUS | Status: AC
  Administered 2018-12-03: 03:00:00 1000 mg via INTRAVENOUS
  Filled 2018-12-02: qty 200

## 2018-12-02 MED ORDER — VANCOMYCIN HCL 10 G IV SOLR
1500.0000 mg | INTRAVENOUS | Status: DC
  Administered 2018-12-02: 1500 mg via INTRAVENOUS
  Filled 2018-12-02: qty 1500

## 2018-12-02 MED ORDER — SODIUM CHLORIDE 0.9% FLUSH
3.0000 mL | Freq: Two times a day (BID) | INTRAVENOUS | Status: DC
  Administered 2018-12-02 (×2): 3 mL via INTRAVENOUS

## 2018-12-02 MED ORDER — FENTANYL CITRATE (PF) 100 MCG/2ML IJ SOLN
INTRAMUSCULAR | Status: DC | PRN
  Administered 2018-12-02: 12.5 ug via INTRAVENOUS
  Administered 2018-12-02: 25 ug via INTRAVENOUS

## 2018-12-02 MED ORDER — CHLORHEXIDINE GLUCONATE CLOTH 2 % EX PADS: 6.0000 | MEDICATED_PAD | Freq: Once | CUTANEOUS | Status: DC

## 2018-12-02 MED ORDER — LIDOCAINE HCL (PF) 1 % IJ SOLN
INTRAMUSCULAR | Status: DC | PRN
  Administered 2018-12-02: 45 mL

## 2018-12-02 MED ORDER — SODIUM CHLORIDE 0.9 % IV SOLN: 250.0000 mL | INTRAVENOUS | Status: DC

## 2018-12-02 SURGICAL SUPPLY — 12 items
CABLE SURGICAL S-101-97-12 (CABLE) ×2 IMPLANT
CATH RIGHTSITE C315HIS02 (CATHETERS) ×1 IMPLANT
IPG PACE AZUR XT DR MRI W1DR01 (Pacemaker) IMPLANT
LEAD CAPSURE NOVUS 5076-52CM (Lead) ×1 IMPLANT
LEAD SELECT SECURE 3830 383069 (Lead) IMPLANT
PACE AZURE XT DR MRI W1DR01 (Pacemaker) ×2 IMPLANT
PAD PRO RADIOLUCENT 2001M-C (PAD) ×2 IMPLANT
SELECT SECURE 3830 383069 (Lead) ×2 IMPLANT
SHEATH 7FR PRELUDE SNAP 13 (SHEATH) ×2 IMPLANT
SLITTER 6232ADJ (MISCELLANEOUS) ×1 IMPLANT
TRAY PACEMAKER INSERTION (PACKS) ×2 IMPLANT
WIRE HI TORQ VERSACORE-J 145CM (WIRE) ×1 IMPLANT

## 2018-12-02 NOTE — Interval H&P Note (Signed)
History and Physical Interval Note:  12/02/2018 3:49 PM  Summer Hawkins  has presented today for surgery, with the diagnosis of bradycardia.  The various methods of treatment have been discussed with the patient and family. After consideration of risks, benefits and other options for treatment, the patient has consented to  Procedure(s): PACEMAKER IMPLANT (N/A) as a surgical intervention.  The patient's history has been reviewed, patient examined, no change in status, stable for surgery.  I have reviewed the patient's chart and labs.  Questions were answered to the patient's satisfaction.     Cristopher Peru

## 2018-12-02 NOTE — H&P (View-Only) (Signed)
Progress Note  Patient Name: Summer Hawkins Date of Encounter: 12/02/2018  Primary Cardiologist: Lauree Chandler, MD   Subjective   No rest SOB, no CP  Inpatient Medications    Scheduled Meds: . amLODipine  5 mg Oral BID  . anastrozole  1 mg Oral Daily  . chlorhexidine  60 mL Topical Once  . chlorhexidine  60 mL Topical Once  . ferrous sulfate  325 mg Oral Q breakfast  . influenza vaccine adjuvanted  0.5 mL Intramuscular Tomorrow-1000  . insulin aspart  0-15 Units Subcutaneous TID WC  . insulin glargine  15 Units Subcutaneous BID AC & HS  . levothyroxine  137 mcg Oral Q0600  . losartan  100 mg Oral Daily   Continuous Infusions: . sodium chloride    . sodium chloride     PRN Meds: diphenhydrAMINE, sodium chloride flush   Vital Signs    Vitals:   12/01/18 1209 12/01/18 1641 12/01/18 2041 12/02/18 0530  BP: (!) 174/61 (!) 168/65 (!) 159/77 (!) 162/66  Pulse: (!) 44 (!) 50 (!) 56 (!) 49  Resp: 20 20 18 18   Temp: 99.5 F (37.5 C) 98.3 F (36.8 C) 98.6 F (37 C) 98.7 F (37.1 C)  TempSrc: Oral Oral Oral Oral  SpO2: 99% 98% 98% 93%  Weight:    104 kg  Height:        Intake/Output Summary (Last 24 hours) at 12/02/2018 0842 Last data filed at 12/02/2018 0534 Gross per 24 hour  Intake 1062 ml  Output 2150 ml  Net -1088 ml   Last 3 Weights 12/02/2018 12/01/2018 11/30/2018  Weight (lbs) 229 lb 3.2 oz 232 lb 231 lb 9.6 oz  Weight (kg) 103.964 kg 105.235 kg 105.053 kg      Telemetry    AFib 40's - Personally Reviewed  ECG    No new EKGs - Personally Reviewed  Physical Exam   GEN: No acute distress, resolving (old) ecchymosis L infraorbital area  Neck: No JVD Cardiac: irreg-irreg, bradycardic, no murmurs, rubs, or gallops.  Respiratory: CTA b/l. GI: Soft, nontender, non-distended  MS: No edema; No deformity. Neuro:  Nonfocal  Psych: Normal affect   Labs    High Sensitivity Troponin:   Recent Labs  Lab 11/30/18 1151 11/30/18 1724  TROPONINIHS  22* 18*      Chemistry Recent Labs  Lab 11/30/18 1151 12/01/18 0555  NA 138 139  K 4.8 4.4  CL 106 106  CO2 21* 22  GLUCOSE 167* 148*  BUN 29* 26*  CREATININE 1.62* 1.39*  CALCIUM 9.3 9.0  GFRNONAA 29* 35*  GFRAA 34* 41*  ANIONGAP 11 11     Hematology Recent Labs  Lab 11/30/18 1151 12/02/18 0600  WBC 11.6* 10.9*  RBC 3.52* 3.31*  HGB 10.8* 10.2*  HCT 34.3* 31.3*  MCV 97.4 94.6  MCH 30.7 30.8  MCHC 31.5 32.6  RDW 13.3 13.2  PLT 286 247    BNP Recent Labs  Lab 11/30/18 2053  BNP 349.0*     DDimer No results for input(s): DDIMER in the last 168 hours.   Radiology    Dg Chest 2 View  Result Date: 11/30/2018 CLINICAL DATA:  Shortness of breath and bradycardia for week. EXAM: CHEST - 2 VIEW COMPARISON:  Chest x-rays dated 11/21/2018 and 04/11/2018. FINDINGS: Stable cardiomegaly. Lungs are clear. No pleural effusion or pneumothorax is seen. No acute appearing osseous abnormality. Chronic degenerative spondylosis of the thoracic spine, mild to moderate in degree. IMPRESSION: 1.  No active cardiopulmonary disease. No evidence of pneumonia or pulmonary edema. 2. Stable cardiomegaly. Electronically Signed   By: Franki Cabot M.D.   On: 11/30/2018 12:34    Cardiac Studies   07/26/17: TTE Study Conclusions - Left ventricle: The cavity size was normal. Wall thickness was   increased in a pattern of mild LVH. Systolic function was normal.   The estimated ejection fraction was in the range of 55% to 60%.   Wall motion was normal; there were no regional wall motion   abnormalities. - Mitral valve: Mildly to moderately calcified annulus. Mildly   thickened leaflets . - Left atrium: The atrium was mildly dilated. - Pulmonary arteries: Systolic pressure was mildly increased. PA   peak pressure: 36 mm Hg (S).  Patient Profile     84 y.o. female w/PMHx of permanent AFib, chronic CHF (diastolic), HTN, HLD, CRI (III), obesity, bradycardia requiring stopping her BB in 2019.  Admitted to [facility] with symptomatic bradycardia.  Progressive weakness, DOE, with reports of persistently HR at home 40's, 2 weeks ago had a near syncopal event (unexplained) vs fall when getting up OOB with head trauma, this event she was noted to have some degree of orthostasis and suspected this was etiology  Assessment & Plan    1. Symptomatic bradycardia     Planned for PPM today     The pt confirms Dr. Lovena Le discussed PPM with her, her follow up questions were answered this morning and she remains agreeable to proceed  2. Permanent AFib     CHA2DS2Vasc is 4, on Eliquis     OAC held today for PPM  3. HTN     Resume BB or titrate current meds post PPM  4. Chronic CHF (diastolic)\     No overt evidence of volume OL currently     DOE 2/2 bradycardia   For questions or updates, please contact [place] Please consult [email or web address] for contact info under     Signed, Baldwin Jamaica, PA-C  12/02/2018, 8:42 AM    EP Attending  Patient seen and examined. Agree with the findings as noted above. The patient has presented with symptomatic bradycardia in the setting of high grade heart block with HR's in the high 30's but mostly in the low 40's. She is on no AV nodal blocking drugs and I have reviewed the indications/risks/benefits/goals/expectations of PPM insertion and she wishes to proceed.  Mikle Bosworth.D.

## 2018-12-02 NOTE — Telephone Encounter (Signed)
Attempted to reach pt, but as I opened chart noticed she was having PPM implant today. Will not return call d/t this

## 2018-12-02 NOTE — Progress Notes (Signed)
Called RN, patient has only right PIV, needs bilateral PIVs for PPM procedure today. Stated she would place order for IV team consult. RN asked about antibiotics, instructed to send with patient to Cath Lab/EP Lab.

## 2018-12-02 NOTE — Telephone Encounter (Signed)
Late entry for [id number]/Summer Hawkins came to the flu clinic at [facility]  Stated she wanted a flu shot and she was having trouble breathing.   Breathing was labored and patient looked uncomfortable.  Knew at she had just gotten out of the hospital for cardiac problem the week before.  Instructed her to call the cardiologist on call and let them know that she could not breathe.  Also told her that this RN did not feel comfortable giving her a flu shot with her history.  Client did call on the on call md and was admitted into the hospital. Suanne Marker Nandana Krolikowski,BSN,RN3,CCM,CN

## 2018-12-02 NOTE — Progress Notes (Addendum)
Progress Note  Patient Name: Summer Hawkins Date of Encounter: 12/02/2018  Primary Cardiologist: Lauree Chandler, MD   Subjective   No rest SOB, no CP  Inpatient Medications    Scheduled Meds: . amLODipine  5 mg Oral BID  . anastrozole  1 mg Oral Daily  . chlorhexidine  60 mL Topical Once  . chlorhexidine  60 mL Topical Once  . ferrous sulfate  325 mg Oral Q breakfast  . influenza vaccine adjuvanted  0.5 mL Intramuscular Tomorrow-1000  . insulin aspart  0-15 Units Subcutaneous TID WC  . insulin glargine  15 Units Subcutaneous BID AC & HS  . levothyroxine  137 mcg Oral Q0600  . losartan  100 mg Oral Daily   Continuous Infusions: . sodium chloride    . sodium chloride     PRN Meds: diphenhydrAMINE, sodium chloride flush   Vital Signs    Vitals:   12/01/18 1209 12/01/18 1641 12/01/18 2041 12/02/18 0530  BP: (!) 174/61 (!) 168/65 (!) 159/77 (!) 162/66  Pulse: (!) 44 (!) 50 (!) 56 (!) 49  Resp: 20 20 18 18   Temp: 99.5 F (37.5 C) 98.3 F (36.8 C) 98.6 F (37 C) 98.7 F (37.1 C)  TempSrc: Oral Oral Oral Oral  SpO2: 99% 98% 98% 93%  Weight:    104 kg  Height:        Intake/Output Summary (Last 24 hours) at 12/02/2018 0842 Last data filed at 12/02/2018 0534 Gross per 24 hour  Intake 1062 ml  Output 2150 ml  Net -1088 ml   Last 3 Weights 12/02/2018 12/01/2018 11/30/2018  Weight (lbs) 229 lb 3.2 oz 232 lb 231 lb 9.6 oz  Weight (kg) 103.964 kg 105.235 kg 105.053 kg      Telemetry    AFib 40's - Personally Reviewed  ECG    No new EKGs - Personally Reviewed  Physical Exam   GEN: No acute distress, resolving (old) ecchymosis L infraorbital area  Neck: No JVD Cardiac: irreg-irreg, bradycardic, no murmurs, rubs, or gallops.  Respiratory: CTA b/l. GI: Soft, nontender, non-distended  MS: No edema; No deformity. Neuro:  Nonfocal  Psych: Normal affect   Labs    High Sensitivity Troponin:   Recent Labs  Lab 11/30/18 1151 11/30/18 1724  TROPONINIHS  22* 18*      Chemistry Recent Labs  Lab 11/30/18 1151 12/01/18 0555  NA 138 139  K 4.8 4.4  CL 106 106  CO2 21* 22  GLUCOSE 167* 148*  BUN 29* 26*  CREATININE 1.62* 1.39*  CALCIUM 9.3 9.0  GFRNONAA 29* 35*  GFRAA 34* 41*  ANIONGAP 11 11     Hematology Recent Labs  Lab 11/30/18 1151 12/02/18 0600  WBC 11.6* 10.9*  RBC 3.52* 3.31*  HGB 10.8* 10.2*  HCT 34.3* 31.3*  MCV 97.4 94.6  MCH 30.7 30.8  MCHC 31.5 32.6  RDW 13.3 13.2  PLT 286 247    BNP Recent Labs  Lab 11/30/18 2053  BNP 349.0*     DDimer No results for input(s): DDIMER in the last 168 hours.   Radiology    Dg Chest 2 View  Result Date: 11/30/2018 CLINICAL DATA:  Shortness of breath and bradycardia for week. EXAM: CHEST - 2 VIEW COMPARISON:  Chest x-rays dated 11/21/2018 and 04/11/2018. FINDINGS: Stable cardiomegaly. Lungs are clear. No pleural effusion or pneumothorax is seen. No acute appearing osseous abnormality. Chronic degenerative spondylosis of the thoracic spine, mild to moderate in degree. IMPRESSION: 1.  No active cardiopulmonary disease. No evidence of pneumonia or pulmonary edema. 2. Stable cardiomegaly. Electronically Signed   By: Franki Cabot M.D.   On: 11/30/2018 12:34    Cardiac Studies   07/26/17: TTE Study Conclusions - Left ventricle: The cavity size was normal. Wall thickness was   increased in a pattern of mild LVH. Systolic function was normal.   The estimated ejection fraction was in the range of 55% to 60%.   Wall motion was normal; there were no regional wall motion   abnormalities. - Mitral valve: Mildly to moderately calcified annulus. Mildly   thickened leaflets . - Left atrium: The atrium was mildly dilated. - Pulmonary arteries: Systolic pressure was mildly increased. PA   peak pressure: 36 mm Hg (S).  Patient Profile     84 y.o. female w/PMHx of permanent AFib, chronic CHF (diastolic), HTN, HLD, CRI (III), obesity, bradycardia requiring stopping her BB in 2019.  Admitted to [facility] with symptomatic bradycardia.  Progressive weakness, DOE, with reports of persistently HR at home 40's, 2 weeks ago had a near syncopal event (unexplained) vs fall when getting up OOB with head trauma, this event she was noted to have some degree of orthostasis and suspected this was etiology  Assessment & Plan    1. Symptomatic bradycardia     Planned for PPM today     The pt confirms Dr. Lovena Le discussed PPM with her, her follow up questions were answered this morning and she remains agreeable to proceed  2. Permanent AFib     CHA2DS2Vasc is 4, on Eliquis     OAC held today for PPM  3. HTN     Resume BB or titrate current meds post PPM  4. Chronic CHF (diastolic)\     No overt evidence of volume OL currently     DOE 2/2 bradycardia   For questions or updates, please contact [place] Please consult [email or web address] for contact info under     Signed, Baldwin Jamaica, PA-C  12/02/2018, 8:42 AM    EP Attending  Patient seen and examined. Agree with the findings as noted above. The patient has presented with symptomatic bradycardia in the setting of high grade heart block with HR's in the high 30's but mostly in the low 40's. She is on no AV nodal blocking drugs and I have reviewed the indications/risks/benefits/goals/expectations of PPM insertion and she wishes to proceed.  Mikle Bosworth.D.

## 2018-12-02 NOTE — Discharge Summary (Addendum)
ELECTROPHYSIOLOGY PROCEDURE DISCHARGE SUMMARY    Patient ID: Summer Hawkins,  MRN: [id number], DOB/AGE: [birth year] 82 y.o.  Admit date: 11/30/2018 Discharge date: 12/03/2018  Primary Care Physician: Haywood Pao, MD  Primary Cardiologist: Lauree Chandler, MD  Electrophysiologist: Dr. Lovena Le (new)  Primary Discharge Diagnosis:  1. Symptomatic bradycardia status post pacemaker implantation this admission  Secondary Discharge Diagnosis:  1. Permanent AFib     CHA2DS2Vasc is 4, on eliquis 2. HTN 3. Chronic CHF (diastolic)     Currently compensated 4. CRI (III)   Allergies  Allergen Reactions  . Bee Venom Shortness Of Breath, Nausea And Vomiting and Other (See Comments)    Makes the patient feel faint, also  . Penicillins Anaphylaxis, Hives, Swelling and Other (See Comments)    Has patient had a PCN reaction causing immediate rash, facial/tongue/throat swelling, SOB or lightheadedness with hypotension: Yes Has patient had a PCN reaction causing severe rash involving mucus membranes or skin necrosis: No Has patient had a PCN reaction that required hospitalization: No Has patient had a PCN reaction occurring within the last 10 years: No If all of the above answers are "NO", then may proceed with Cephalosporin use.      Procedures This Admission:  1.  Implantation of a Medtronic dual chamber PPM on 12/02/2018 by Dr. Lovena Le.  The patient received a Medtronic  (serial number [id number]) pacemaker, medtronic (serial number [id number]) his bundle lead and a Medtronic [id number] (serial number [id number]) right ventricular lead  There were no immediate post procedure complications. 2.  CXR on 12/03/2018 demonstrated no pneumothorax status post device implantation.   Brief HPI: Summer Hawkins is a 84 y.o. female was admitted to [facility] with progressive fatigue, DOE, she made the observation at home that her HR would not get above the 40's.  She was observed to have SB 40's here, labs upon  arrival unremarkable with Creat 1.62 close to her baseline, HS Trop 22, CXR did not note CHF, and felt to have symptomatic bradycardia/chronotropic incompitence and recommended [facility]   Hospital Course:  The patient was admitted and underwent implantation of a Medtronic dual chamber PPM with details as outlined above.  She was monitored on telemetry overnight which demonstrated AFib, V paced rhythm.  Left chest was without hematoma or ecchymosis.  The device was interrogated and found to be functioning normally.  CXR was obtained and demonstrated no pneumothorax status post device implantation.  Wound care, arm mobility, and restrictions were reviewed with the patient.  The patient feels well, much better, denies any CP or SOB, she was examined by Dr. Lovena Le and considered stable for discharge to home.   Resume Eliquis on Monday She has some edema, stop her amlodipine and start Toprol XL 25mg  daily, and change her lasix to 20mg  daily rather then PRN Will make further adjustments for BP going forward as needed, though she reports her home BP usually are much better then it has been here.   Physical Exam: Vitals:   12/02/18 2102 12/02/18 2349 12/03/18 0509 12/03/18 0631  BP: 130/74 (!) 159/60 (!) 159/75   Pulse: 66 69 70   Resp:  18 14   Temp:  98.4 F (36.9 C) 98.6 F (37 C)   TempSrc:  Oral Oral   SpO2: 99% 99% 95%   Weight:    104.8 kg  Height:         GEN- The patient is well appearing, alert and oriented x 3 today.   HEENT:  normocephalic, atraumatic; sclera clear, conjunctiva pink; hearing intact; oropharynx clear; neck supple, no JVP Lymph- no cervical lymphadenopathy Lungs-  CTA b/l, normal work of breathing.  No wheezes, rales, rhonchi Heart- RRR, no murmurs, rubs or gallops, PMI not laterally displaced GI- soft, non-tender, non-distended Extremities- no clubbing, cyanosis, trace edema MS- no significant deformity or atrophy Skin- warm and dry, no rash or lesion, left chest  without hematoma/ecchymosis Psych- euthymic mood, full affect Neuro- strength and sensation are intact   Labs:   Lab Results  Component Value Date   WBC 10.9 (H) 12/02/2018   HGB 10.2 (L) 12/02/2018   HCT 31.3 (L) 12/02/2018   MCV 94.6 12/02/2018   PLT 247 12/02/2018    Recent Labs  Lab 12/01/18 0555  NA 139  K 4.4  CL 106  CO2 22  BUN 26*  CREATININE 1.39*  CALCIUM 9.0  GLUCOSE 148*    Discharge Medications:  Allergies as of 12/03/2018      Reactions   Bee Venom Shortness Of Breath, Nausea And Vomiting, Other (See Comments)   Makes the patient feel faint, also   Penicillins Anaphylaxis, Hives, Swelling, Other (See Comments)   Has patient had a PCN reaction causing immediate rash, facial/tongue/throat swelling, SOB or lightheadedness with hypotension: Yes Has patient had a PCN reaction causing severe rash involving mucus membranes or skin necrosis: No Has patient had a PCN reaction that required hospitalization: No Has patient had a PCN reaction occurring within the last 10 years: No If all of the above answers are "NO", then may proceed with Cephalosporin use.      Medication List    STOP taking these medications   amLODipine 5 MG tablet Commonly known as: NORVASC     TAKE these medications   anastrozole 1 MG tablet Commonly known as: ARIMIDEX TAKE 1 TABLET(1 MG) BY MOUTH DAILY What changed:   how much to take  how to take this  when to take this  additional instructions   apixaban 5 MG Tabs tablet Commonly known as: Eliquis Take 1 tablet (5 mg total) by mouth 2 (two) times daily. Notes to patient: DO NOT start back until Monday 12/09/2018   BD Pen Needle Nano U/F 32G X 4 MM Misc Generic drug: Insulin Pen Needle 1 application by Other route daily.   diphenhydramine-acetaminophen 25-500 MG Tabs tablet Commonly known as: TYLENOL PM Take 1 tablet by mouth at bedtime as needed (for sleep).   ferrous sulfate 325 (65 FE) MG tablet Take 325 mg by  mouth daily with breakfast.   furosemide 20 MG tablet Commonly known as: Lasix Take 1 tablet (20 mg total) by mouth daily. What changed:   how much to take  how to take this  when to take this  additional instructions   glimepiride 2 MG tablet Commonly known as: AMARYL Take 2 mg by mouth daily with breakfast.   levothyroxine 137 MCG tablet Commonly known as: SYNTHROID Take 137 mcg by mouth daily.   losartan 100 MG tablet Commonly known as: COZAAR Take 100 mg by mouth daily.   metFORMIN 850 MG tablet Commonly known as: GLUCOPHAGE Take 850 mg by mouth 2 (two) times daily with a meal.   metoprolol succinate 25 MG 24 hr tablet Commonly known as: Toprol XL Take 1 tablet (25 mg total) by mouth daily. Notes to patient: CONTINUE your amlodipine as usual until you receive this medicine in the mail, then stop the amlodipine and start this medicine as we discussed  multivitamin with minerals tablet Take 1 tablet by mouth daily.   naproxen sodium 220 MG tablet Commonly known as: ALEVE Take 220 mg by mouth 2 (two) times daily as needed (for pain or headaches).   ONE TOUCH ULTRA TEST test strip Generic drug: glucose blood daily.   Toujeo SoloStar 300 UNIT/ML Sopn Generic drug: Insulin Glargine (1 Unit Dial) Inject 15 Units into the skin See admin instructions. Inject 15 units into the skin in the morning before breakfast and 15 units at bedtime       Disposition:  Home  Discharge Instructions    Diet - low sodium heart healthy   Complete by: As directed    Increase activity slowly   Complete by: As directed      Follow-up Information    Evans Lance, MD Follow up.   Specialty: Cardiology Why: 03/04/2019 @ 11:30AM Contact information: [id number] [place] [address] [phone]        Baldwin Jamaica, PA-C Follow up.   Specialty: Cardiology Why: 12/17/2018 @ 1:00PM, pacemaker wound check/follow up visit Contact information:  [place] [address] [phone]           Duration of Discharge Encounter: Greater than 30 minutes including physician time.  Signed, Baldwin Jamaica, PA-C  12/03/2018 9:14 AM   EP Attending  Patient seen and examined. Agree with above. The patient is doing well after PPM insertion. She is stable for DC. She notes that she feels better. Meds as above.  Mikle Bosworth.D.

## 2018-12-02 NOTE — Discharge Instructions (Signed)
After Your Pacemaker   You have a Medtronic Pacemaker   Do not lift your arm above shoulder height for 1 week after your procedure. After 7 days, you may progress as below.     Monday December 09, 2018  Tuesday December 10, 2018 Wednesday December 11, 2018 Thursday December 12, 2018    Do not lift, push, pull, or carry anything over 10 pounds with the affected arm until 6 weeks (Monday January 13, 2019) after your procedure.    Do not drive until your wound check, or until instructed by your healthcare provider that you are safe to do so.    Monitor your pacemaker site for redness, swelling, and drainage. Call the device clinic at [phone] if you experience these symptoms or fever/chills.   If your incision is sealed with Steri-strips or staples. You may shower 7 days after your procedure and wash your incision with soap and water as long as it is healed. If your incision is closed with Dermabond/Surgical glue. You may shower 1 day after your pacemaker implant and wash your incision with soap and water. Avoid lotions, ointments, or perfumes over your incision until it is well-healed.   You may use a hot tub or a pool AFTER your wound check appointment if the incision is completely closed.   Your Pacemaker may be MRI compatible. We will discuss this at your first follow up/wound check. .    Remote monitoring is used to monitor your pacemaker from home. This monitoring is scheduled every 91 days by our office. It allows [place] to keep an eye on the functioning of your device to ensure it is working properly. You will routinely see your Electrophysiologist annually (more often if necessary).    Pacemaker Implantation, Care After This sheet gives you information about how to care for yourself after your procedure. Your health care provider may also give you more specific instructions. If you have problems or questions, contact your health care provider. What can I expect after the  procedure? After the procedure, it is common to have:  Mild pain.  Slight bruising.  Some swelling over the incision.  A slight bump over the skin where the device was placed. Sometimes, it is possible to feel the device under the skin. This is normal.  You should received your Pacemaker ID card within 4-8 weeks. Follow these instructions at home: Medicines  Take over-the-counter and prescription medicines only as told by your health care provider.  If you were prescribed an antibiotic medicine, take it as told by your health care provider. Do not stop taking the antibiotic even if you start to feel better. Wound care     Do not remove the bandage on your chest until directed to do so by your health care provider.  After your bandage is removed, you may see pieces of tape called skin adhesive strips over the area where the cut was made (incision site). Let them fall off on their own.  Check the incision site every day to make sure it is not infected, bleeding, or starting to pull apart.  Do not use lotions or ointments near the incision site unless directed to do so.  Keep the incision area clean and dry for 7 days after the procedure or as directed by your health care provider. It takes several weeks for the incision site to completely heal.  Do not take baths, swim, or use a hot tub for 7-10 days or as otherwise directed by your  health care provider. Activity  Do not drive or use heavy machinery while taking prescription pain medicine.  Do not drive for 24 hours if you were given a medicine to help you relax (sedative).  Check with your health care provider before you start to drive or play sports.  Avoid sudden jerking, pulling, or chopping movements that pull your upper arm far away from your body. Avoid these movements for at least 6 weeks or as long as told by your health care provider.  Do not lift your upper arm above your shoulders for at least 6 weeks or as long  as told by your health care provider. This means no tennis, golf, or swimming.  You may go back to work when your health care provider says it is okay. Pacemaker care  You may be shown how to transfer data from your pacemaker through the phone to your health care provider.  Always let all health care providers know about your pacemaker before you have any medical procedures or tests.  Wear a medical ID bracelet or necklace stating that you have a pacemaker. Carry a pacemaker ID card with you at all times.  Your pacemaker battery will last for 5-15 years. Routine checks by your health care provider will let the health care provider know when the battery is starting to run down. The pacemaker will need to be replaced when the battery starts to run down.  Do not use amateur Chief of Staff. Other electrical devices are safe to use, including power tools, lawn mowers, and speakers. If you are unsure of whether something is safe to use, ask your health care provider.  When using your cell phone, hold it to the ear opposite the pacemaker. Do not leave your cell phone in a pocket over the pacemaker.  Avoid places or objects that have a strong electric or magnetic field, including: ? Airport Herbalist. When at the airport, let officials know that you have a pacemaker. ? Power plants. ? Large electrical generators. ? Radiofrequency transmission towers, such as cell phone and radio towers. General instructions  Weigh yourself every day. If you suddenly gain weight, fluid may be building up in your body.  Keep all follow-up visits as told by your health care provider. This is important. Contact a health care provider if:  You gain weight suddenly.  Your legs or feet swell.  It feels like your heart is fluttering or skipping beats (heart palpitations).  You have chills or a fever.  You have more redness, swelling, or pain around your incisions.  You have  more fluid or blood coming from your incisions.  Your incisions feel warm to the touch.  You have pus or a bad smell coming from your incisions. Get help right away if:  You have chest pain.  You have trouble breathing or are short of breath.  You become extremely tired.  You are light-headed or you faint. This information is not intended to replace advice given to you by your health care provider. Make sure you discuss any questions you have with your health care provider.

## 2018-12-03 ENCOUNTER — Inpatient Hospital Stay (HOSPITAL_COMMUNITY): Payer: Medicare Other

## 2018-12-03 ENCOUNTER — Encounter (HOSPITAL_COMMUNITY): Payer: Self-pay | Admitting: Internal Medicine

## 2018-12-03 LAB — GLUCOSE, CAPILLARY: Glucose-Capillary: 177 mg/dL — ABNORMAL HIGH (ref 70–99)

## 2018-12-03 MED ORDER — FUROSEMIDE 20 MG PO TABS: 20.0000 mg | ORAL_TABLET | Freq: Every day | ORAL | 3 refills | Status: DC

## 2018-12-03 MED ORDER — METOPROLOL SUCCINATE ER 25 MG PO TB24: 25.0000 mg | ORAL_TABLET | Freq: Every day | ORAL | 3 refills | Status: DC

## 2018-12-03 NOTE — Plan of Care (Signed)
  Problem: Clinical Measurements: Goal: Will remain free from infection Outcome: Progressing   Problem: Activity: Goal: Risk for activity intolerance will decrease Outcome: Progressing   Problem: Safety: Goal: Ability to remain free from injury will improve Outcome: Progressing   

## 2018-12-04 DIAGNOSIS — N183 Chronic kidney disease, stage 3 (moderate): Secondary | ICD-10-CM | POA: Diagnosis not present

## 2018-12-04 DIAGNOSIS — I4821 Permanent atrial fibrillation: Secondary | ICD-10-CM | POA: Diagnosis not present

## 2018-12-04 DIAGNOSIS — I498 Other specified cardiac arrhythmias: Secondary | ICD-10-CM | POA: Diagnosis not present

## 2018-12-04 DIAGNOSIS — Z950 Presence of cardiac pacemaker: Secondary | ICD-10-CM | POA: Diagnosis not present

## 2018-12-04 DIAGNOSIS — I509 Heart failure, unspecified: Secondary | ICD-10-CM | POA: Diagnosis not present

## 2018-12-04 DIAGNOSIS — I130 Hypertensive heart and chronic kidney disease with heart failure and stage 1 through stage 4 chronic kidney disease, or unspecified chronic kidney disease: Secondary | ICD-10-CM | POA: Diagnosis not present

## 2018-12-04 DIAGNOSIS — R5381 Other malaise: Secondary | ICD-10-CM | POA: Diagnosis not present

## 2018-12-05 DIAGNOSIS — Z961 Presence of intraocular lens: Secondary | ICD-10-CM | POA: Diagnosis not present

## 2018-12-05 DIAGNOSIS — H40053 Ocular hypertension, bilateral: Secondary | ICD-10-CM | POA: Diagnosis not present

## 2018-12-09 ENCOUNTER — Other Ambulatory Visit: Payer: Self-pay | Admitting: Hematology and Oncology

## 2018-12-09 DIAGNOSIS — Z9889 Other specified postprocedural states: Secondary | ICD-10-CM

## 2018-12-17 ENCOUNTER — Other Ambulatory Visit: Payer: Self-pay

## 2018-12-17 ENCOUNTER — Ambulatory Visit (INDEPENDENT_AMBULATORY_CARE_PROVIDER_SITE_OTHER): Payer: Medicare Other | Admitting: Physician Assistant

## 2018-12-17 ENCOUNTER — Encounter: Payer: Self-pay | Admitting: Physician Assistant

## 2018-12-17 VITALS — BP 140/62 | HR 86 | Ht 64.5 in | Wt 221.0 lb

## 2018-12-17 DIAGNOSIS — I10 Essential (primary) hypertension: Secondary | ICD-10-CM

## 2018-12-17 DIAGNOSIS — Z950 Presence of cardiac pacemaker: Secondary | ICD-10-CM | POA: Diagnosis not present

## 2018-12-17 DIAGNOSIS — Z5189 Encounter for other specified aftercare: Secondary | ICD-10-CM

## 2018-12-17 DIAGNOSIS — I482 Chronic atrial fibrillation, unspecified: Secondary | ICD-10-CM

## 2018-12-17 DIAGNOSIS — I5032 Chronic diastolic (congestive) heart failure: Secondary | ICD-10-CM

## 2018-12-17 DIAGNOSIS — I4821 Permanent atrial fibrillation: Secondary | ICD-10-CM

## 2018-12-17 DIAGNOSIS — R001 Bradycardia, unspecified: Secondary | ICD-10-CM

## 2018-12-17 NOTE — Progress Notes (Signed)
Cardiology Office Note Date:  12/17/2018  Patient ID:  Summer Hawkins, Summer Hawkins March 31, 1934, MRN [id number] PCP:  Haywood Pao, MD  Cardiologist:  Dr. Angelena Form Electrophysiologist: Dr. Curt Bears     Chief Complaint:  s/p PPM  History of Present Illness: Summer Hawkins is a 84 y.o. female with history of permanent AFib, chronic CHF (diastolic), HTN, HLD, DM, obesity, CKD III, hypothyroidism.  She was initially seen by Dr. Curt Bears 2/2  bradycardia 40's-50's (already off rate limiting drugs)  though monitoring also noted ability to elevate her HR with exertion and was hoping to be able to avoid pacing.  She had a fall at home with head trauma, discharged from same day.  At her F/u with Dr. Angelena Form she had a syncopal episode and was hospitalized this event suspect to have been orthostatic, she was observed on telemetry to have bradycardia again to 40's though without symptoms.  She was treated for diastolic HF, diuresed and discharged 11/23/2018.  Shew was readmitted to [facility] 11/30/2018 with the patients observation of her HR down to 30's at times, unable to get HR above 50 and with exertional intolerances, symptomatic bradycardia and underwent pacing with Dr. Lovena Le 12/02/2018, discharged the following day   She feels "100% better" since the pacemaker.  Not quite back to full strength but night and day better.  No CP, palpitations or SOB, no dizzy spells, near syncope or syncope.  No site concerns.  No bleeding or signs of bleeding with her Eliquis.    Device information MDT dual chamber PPM with HIS and RV lead, implanted 12/02/2018, Dr. Lovena Le  Past Medical History:  Diagnosis Date  . Atrial fibrillation ([place])   . Breast cancer ([place])   . Breast cancer, left ([place])   . Chronic kidney disease    stage III - patient was unaware  . Diabetes mellitus   . Dyspnea   . Dysrhythmia    Afib  . H/O: hysterectomy   . History of colonoscopy 01/25/2010  . History of mammogram 08/04/2009  .  Hyperlipidemia   . Hypertension   . Hypothyroidism   . Ketoacidosis, diabetic, no coma, non-insulin dependent    Type II  . Vitamin B12 deficiency     Past Surgical History:  Procedure Laterality Date  . ABDOMINAL HYSTERECTOMY    . Bilateral foot surgery    . BREAST EXCISIONAL BIOPSY    . BREAST LUMPECTOMY Left    2017  . BREAST LUMPECTOMY WITH RADIOACTIVE SEED AND SENTINEL LYMPH NODE BIOPSY Left 01/19/2016   Procedure: LEFT BREAST LUMPECTOMY WITH RADIOACTIVE SEED AND SENTINEL LYMPH NODE BIOPSY;  Surgeon: Autumn Messing III, MD;  Location: [facility];  Service: General;  Laterality: Left;  . BREAST LUMPECTOMY WITH RADIOACTIVE SEED LOCALIZATION Left 01/19/2016  . CARDIOVERSION  02/27/2011   Procedure: CARDIOVERSION;  Surgeon: Loralie Champagne, MD;  Location: [facility];  Service: Cardiovascular;  Laterality: N/A;  . LAPAROSCOPIC CHOLECYSTECTOMY    . PACEMAKER IMPLANT N/A 12/02/2018   Procedure: PACEMAKER IMPLANT;  Surgeon: Evans Lance, MD;  Location: [facility];  Service: Cardiovascular;  Laterality: N/A;  . ROTATOR CUFF REPAIR Left   . TUBAL LIGATION      Current Outpatient Medications  Medication Sig Dispense Refill  . anastrozole (ARIMIDEX) 1 MG tablet TAKE 1 TABLET(1 MG) BY MOUTH DAILY (Patient taking differently: Take 1 mg by mouth daily. ) 90 tablet 3  . apixaban (ELIQUIS) 5 MG TABS tablet Take 1 tablet (5 mg total) by mouth  2 (two) times daily. 60 tablet   . BD PEN NEEDLE NANO U/F 32G X 4 MM MISC 1 application by Other route daily.     . diphenhydramine-acetaminophen (TYLENOL PM) 25-500 MG TABS tablet Take 1 tablet by mouth at bedtime as needed (for sleep).    . ferrous sulfate 325 (65 FE) MG tablet Take 325 mg by mouth daily with breakfast.      . furosemide (LASIX) 20 MG tablet Take 1 tablet (20 mg total) by mouth daily. 90 tablet 3  . glimepiride (AMARYL) 2 MG tablet Take 2 mg by mouth daily with breakfast.     . levothyroxine (SYNTHROID) 137 MCG tablet Take 137 mcg by mouth  daily.    Marland Kitchen losartan (COZAAR) 100 MG tablet Take 100 mg by mouth daily.    . metFORMIN (GLUCOPHAGE) 850 MG tablet Take 850 mg by mouth 2 (two) times daily with a meal.     . metoprolol succinate (TOPROL XL) 25 MG 24 hr tablet Take 1 tablet (25 mg total) by mouth daily. 90 tablet 3  . Multiple Vitamins-Minerals (MULTIVITAMIN WITH MINERALS) tablet Take 1 tablet by mouth daily.      . naproxen sodium (ALEVE) 220 MG tablet Take 220 mg by mouth 2 (two) times daily as needed (for pain or headaches).    . ONE TOUCH ULTRA TEST test strip daily.     Nelva Nay SOLOSTAR 300 UNIT/ML SOPN Inject 15 Units into the skin See admin instructions. Inject 15 units into the skin in the morning before breakfast and 15 units at bedtime  1   No current facility-administered medications for this visit.     Allergies:   Bee venom and Penicillins   Social History:  The patient  reports that she has never smoked. She has never used smokeless tobacco. She reports that she does not drink alcohol or use drugs.   Family History:  The patient's family history includes Breast cancer in her sister; Diabetes (age of onset: 46) in her mother; Heart failure (age of onset: 107) in her father; Pneumonia (age of onset: 54) in her mother.  ROS:  Please see the history of present illness.  All other systems are reviewed and otherwise negative.   PHYSICAL EXAM:  VS:  BP 140/62   Pulse 86   Ht 5' 4.5" (1.638 m)   Wt 221 lb (100.2 kg)   LMP  (LMP Unknown)   BMI 37.35 kg/m  BMI: Body mass index is 37.35 kg/m. Well nourished, well developed, in no acute distress  HEENT: normocephalic, atraumatic  Neck: no JVD, carotid bruits or masses Cardiac:  RRR; (paced)no significant murmurs, no rubs, or gallops Lungs:  CTA b/l, no wheezing, rhonchi or rales  Abd: soft, nontender MS: no deformity or atrophy Ext:  no edema  Skin: warm and dry, no rash Neuro:  No gross deficits appreciated Psych: euthymic mood, full affect  PPM site:  steri strips removed without difficulty, wound edges are well approximated, no bleeding or drainage, no erythema, edema or fluctuation.  No increased heat to the surrounding tissues    EKG:  Not done today PPM interrogation done today and reviewed by myself: battery and lead measurements are good, she has HIS lead in A port and RV apex in RV.  She is brady 95's underlying today   07/26/17: TTE Study Conclusions - Left ventricle: The cavity size was normal. Wall thickness was increased in a pattern of mild LVH. Systolic function was normal. The  estimated ejection fraction was in the range of 55% to 60%. Wall motion was normal; there were no regional wall motion abnormalities. - Mitral valve: Mildly to moderately calcified annulus. Mildly thickened leaflets . - Left atrium: The atrium was mildly dilated. - Pulmonary arteries: Systolic pressure was mildly increased. PA peak pressure: 36 mm Hg (S).  Recent Labs: 11/21/2018: TSH 0.781 11/30/2018: B Natriuretic Peptide 349.0 12/01/2018: BUN 26; Creatinine, Ser 1.39; Potassium 4.4; Sodium 139 12/02/2018: Hemoglobin 10.2; Magnesium 1.8; Platelets 247  No results found for requested labs within last 8760 hours.   Estimated Creatinine Clearance: 36.3 mL/min (A) (by C-G formula based on SCr of 1.39 mg/dL (H)).   Wt Readings from Last 3 Encounters:  12/17/18 221 lb (100.2 kg)  12/03/18 231 lb (104.8 kg)  11/23/18 227 lb 3.2 oz (103.1 kg)     Other studies reviewed: Additional studies/records reviewed today include: summarized above  ASSESSMENT AND PLAN:  1. PPM     Intact function, acute implant lead outputs remain     No programming changes made  2. Permanent AFib     CHA2DS2Vasc is 4, on Eliquis, appropriately dosed  3. HTN     No changes today  4. Chronic CHF (diastolic)        No symptoms or exam findings to suggest fluid OL        Disposition: F/u with Q 3 mo remotes.  She will see Dr. Lovena Le in December, and  then would like to resume care with Dr. Curt Bears going forward after that  Current medicines are reviewed at length with the patient today.  The patient did not have any concerns regarding medicines.  Venetia Night, PA-C 12/17/2018 3:25 PM     [address] [place] [address] [phone] (office)  [phone] (fax)

## 2018-12-17 NOTE — Patient Instructions (Addendum)
Medication Instructions:   Your physician recommends that you continue on your current medications as directed. Please refer to the Current Medication list given to you today.  If you need a refill on your cardiac medications before your next appointment, please call your pharmacy.   Lab work:NONE ORDERED  TODAY  If you have labs (blood work) drawn today and your tests are completely normal, you will receive your results only by: Marland Kitchen MyChart Message (if you have MyChart) OR . A paper copy in the mail If you have any lab test that is abnormal or we need to change your treatment, we will call you to review the results.  Testing/Procedures: NONE ORDERED  TODAY   Follow-Up: Current appointment moved up to see Dr. Lovena Le before planned trip   Any Other Special Instructions Will Be Listed Below (If Applicable).

## 2018-12-20 ENCOUNTER — Ambulatory Visit
Admission: RE | Admit: 2018-12-20 | Discharge: 2018-12-20 | Disposition: A | Discharge status: Home / Self Care | Payer: Medicare Other | Source: Ambulatory Visit | Attending: Hematology and Oncology | Admitting: Hematology and Oncology

## 2018-12-20 ENCOUNTER — Other Ambulatory Visit: Payer: Self-pay

## 2018-12-20 DIAGNOSIS — Z9889 Other specified postprocedural states: Secondary | ICD-10-CM | POA: Diagnosis not present

## 2018-12-20 DIAGNOSIS — R928 Other abnormal and inconclusive findings on diagnostic imaging of breast: Secondary | ICD-10-CM | POA: Diagnosis not present

## 2019-01-08 NOTE — Progress Notes (Signed)
Patient Care Team: Tisovec, Fransico Him, MD as PCP - General (Internal Medicine) Burnell Blanks, MD as PCP - Cardiology (Cardiology) Jovita Kussmaul, MD as Consulting Physician (General Surgery) Nicholas Lose, MD as Consulting Physician (Hematology and Oncology) Gardenia Phlegm, NP as Nurse Practitioner (Hematology and Oncology)  DIAGNOSIS:    ICD-10-CM   1. Malignant neoplasm of upper-outer quadrant of left breast in female, estrogen receptor positive ([place])  C50.412    Z17.0     SUMMARY OF ONCOLOGIC HISTORY: Oncology History  Breast cancer of upper-outer quadrant of left female breast ([place])  12/15/2015 Initial Diagnosis   Left breast biopsy: Grade 1-2 IDC with calcifications with DCIS, ER 100%, PR 90%, Ki-67 5%, HER-2 negative ratio 1.45; left breast mass 1.9 x 1 x 1.1 cm; T1 CN 0 stage IA clinical stage   01/19/2016 Surgery   Left lumpectomy: IDC with calcifications, 1.8 cm, grade 2, DCIS with calcifications, 0/1 lymph node negative, T1 CN 0 stage IA   01/26/2016 Oncotype testing   Oncotype DX score 8: risk of recurrence 6%    Radiation Therapy   Refused radiation therapy   04/13/2016 -  Anti-estrogen oral therapy   Anastrozole 1 mg daily     CHIEF COMPLIANT: Follow-up of left breast cancer on anastrozole therapy  INTERVAL HISTORY: Summer Hawkins is a 84 y.o. with above-mentioned history of left breast cancer treated with lumpectomy, she refused radiation, and is currently on anti-estrogen therapy with anastrozole. I last saw her a year ago. Mammogram on 12/20/18 showed no evidence of malignancy bilaterally. She presents to the clinic today for annual follow-up.   REVIEW OF SYSTEMS:   Constitutional: Denies fevers, chills or abnormal weight loss Eyes: Denies blurriness of vision Ears, nose, mouth, throat, and face: Denies mucositis or sore throat Respiratory: Denies cough, dyspnea or wheezes Cardiovascular: Denies palpitation, chest discomfort  Gastrointestinal: Denies nausea, heartburn or change in bowel habits Skin: Denies abnormal skin rashes Lymphatics: Denies new lymphadenopathy or easy bruising Neurological: Denies numbness, tingling or new weaknesses Behavioral/Psych: Mood is stable, no new changes  Extremities: No lower extremity edema Breast: denies any pain or lumps or nodules in either breasts All other systems were reviewed with the patient and are negative.  I have reviewed the past medical history, past surgical history, social history and family history with the patient and they are unchanged from previous note.  ALLERGIES:  is allergic to bee venom and penicillins.  MEDICATIONS:  Current Outpatient Medications  Medication Sig Dispense Refill  . anastrozole (ARIMIDEX) 1 MG tablet TAKE 1 TABLET(1 MG) BY MOUTH DAILY (Patient taking differently: Take 1 mg by mouth daily. ) 90 tablet 3  . apixaban (ELIQUIS) 5 MG TABS tablet Take 1 tablet (5 mg total) by mouth 2 (two) times daily. 60 tablet   . BD PEN NEEDLE NANO U/F 32G X 4 MM MISC 1 application by Other route daily.     . diphenhydramine-acetaminophen (TYLENOL PM) 25-500 MG TABS tablet Take 1 tablet by mouth at bedtime as needed (for sleep).    . ferrous sulfate 325 (65 FE) MG tablet Take 325 mg by mouth daily with breakfast.      . furosemide (LASIX) 20 MG tablet Take 1 tablet (20 mg total) by mouth daily. 90 tablet 3  . glimepiride (AMARYL) 2 MG tablet Take 2 mg by mouth daily with breakfast.     . levothyroxine (SYNTHROID) 137 MCG tablet Take 137 mcg by mouth daily.    Marland Kitchen  losartan (COZAAR) 100 MG tablet Take 100 mg by mouth daily.    . metFORMIN (GLUCOPHAGE) 850 MG tablet Take 850 mg by mouth 2 (two) times daily with a meal.     . metoprolol succinate (TOPROL XL) 25 MG 24 hr tablet Take 1 tablet (25 mg total) by mouth daily. 90 tablet 3  . Multiple Vitamins-Minerals (MULTIVITAMIN WITH MINERALS) tablet Take 1 tablet by mouth daily.      . naproxen sodium (ALEVE)  220 MG tablet Take 220 mg by mouth 2 (two) times daily as needed (for pain or headaches).    . ONE TOUCH ULTRA TEST test strip daily.     Nelva Nay SOLOSTAR 300 UNIT/ML SOPN Inject 15 Units into the skin See admin instructions. Inject 15 units into the skin in the morning before breakfast and 15 units at bedtime  1   No current facility-administered medications for this visit.     PHYSICAL EXAMINATION: ECOG PERFORMANCE STATUS: 1 - Symptomatic but completely ambulatory  Vitals:   01/09/19 1421  BP: (!) 151/80  Pulse: 83  Resp: 17  Temp: 98.7 F (37.1 C)  SpO2: 98%   Filed Weights   01/09/19 1421  Weight: 219 lb 14.4 oz (99.7 kg)    GENERAL: alert, no distress and comfortable SKIN: skin color, texture, turgor are normal, no rashes or significant lesions EYES: normal, Conjunctiva are pink and non-injected, sclera clear OROPHARYNX: no exudate, no erythema and lips, buccal mucosa, and tongue normal  NECK: supple, thyroid normal size, non-tender, without nodularity LYMPH: no palpable lymphadenopathy in the cervical, axillary or inguinal LUNGS: clear to auscultation and percussion with normal breathing effort HEART: regular rate & rhythm and no murmurs and no lower extremity edema ABDOMEN: abdomen soft, non-tender and normal bowel sounds MUSCULOSKELETAL: no cyanosis of digits and no clubbing  NEURO: alert & oriented x 3 with fluent speech, no focal motor/sensory deficits EXTREMITIES: No lower extremity edema BREAST: No palpable masses or nodules in either right or left breasts. No palpable axillary supraclavicular or infraclavicular adenopathy no breast tenderness or nipple discharge. (exam performed in the presence of a chaperone)  LABORATORY DATA:  I have reviewed the data as listed CMP Latest Ref Rng & Units 12/01/2018 11/30/2018 11/22/2018  Glucose 70 - 99 mg/dL 148(H) 167(H) 257(H)  BUN 8 - 23 mg/dL 26(H) 29(H) 30(H)  Creatinine 0.44 - 1.00 mg/dL 1.39(H) 1.62(H) 1.82(H)  Sodium  135 - 145 mmol/L 139 138 135  Potassium 3.5 - 5.1 mmol/L 4.4 4.8 4.2  Chloride 98 - 111 mmol/L 106 106 101  CO2 22 - 32 mmol/L 22 21(L) 23  Calcium 8.9 - 10.3 mg/dL 9.0 9.3 9.0  Total Protein 6.5 - 8.1 g/dL - - -  Total Bilirubin 0.3 - 1.2 mg/dL - - -  Alkaline Phos 38 - 126 U/L - - -  AST 15 - 41 U/L - - -  ALT 0 - 44 U/L - - -    Lab Results  Component Value Date   WBC 10.9 (H) 12/02/2018   HGB 10.2 (L) 12/02/2018   HCT 31.3 (L) 12/02/2018   MCV 94.6 12/02/2018   PLT 247 12/02/2018   NEUTROABS 7.0 11/21/2018    ASSESSMENT & PLAN:  Breast cancer of upper-outer quadrant of left female breast ([place]) 12/10/2015: Left breast biopsy: Grade 1-2 IDC with calcifications with DCIS, ER 100%, PR 90%, Ki-67 5%, HER-2 negative ratio 1.45; left breast mass 1.9 x 1 x 1.1 cm; T1 CN 0 stage IA  clinical stage 01/19/2016 Left lumpectomy: IDC with calcifications, 1.8 cms, grade 2, DCIS with calcifications, 0/1 lymph node negative, T1 CN 0 stage IA Oncotype DX score 8: Risk of recurrence 6% Patient refusedradiation therapy  Current treatment:No role of systemic chemotherapy adjuvant antiestrogen therapy with anastrozole 1 mg by mouth daily 5 years,startedFebruary 2018.  Anastrozoletoxicities: Denies any side effects to antiestrogen therapy.  Hot flashes have resolved. Denies any arthralgias or myalgias  Surveillance: 1.Breast exam  01/09/2019: Benign 2.Mammogram   12/20/2018: Breast density category B, no evidence of breast cancer.  Hospitalization for symptomatic bradycardia and atrial fibrillation 09/25/2017 to 09/27/2017; pacemaker implantation September 2020. Patient feels remarkably better since that time.  Return to clinic inone yearfor follow-up   No orders of the defined types were placed in this encounter.  The patient has a good understanding of the overall plan. she agrees with it. she will call with any problems that may develop before the next visit here.  Nicholas Lose, MD 01/09/2019  Julious Oka Dorshimer am acting as scribe for Dr. Nicholas Lose.  I have reviewed the above documentation for accuracy and completeness, and I agree with the above.

## 2019-01-09 ENCOUNTER — Inpatient Hospital Stay: Payer: Medicare Other | Attending: Hematology and Oncology | Admitting: Hematology and Oncology

## 2019-01-09 ENCOUNTER — Other Ambulatory Visit: Payer: Self-pay

## 2019-01-09 DIAGNOSIS — E1129 Type 2 diabetes mellitus with other diabetic kidney complication: Secondary | ICD-10-CM | POA: Diagnosis not present

## 2019-01-09 DIAGNOSIS — C50412 Malignant neoplasm of upper-outer quadrant of left female breast: Secondary | ICD-10-CM | POA: Diagnosis not present

## 2019-01-09 DIAGNOSIS — Z950 Presence of cardiac pacemaker: Secondary | ICD-10-CM | POA: Insufficient documentation

## 2019-01-09 DIAGNOSIS — Z79811 Long term (current) use of aromatase inhibitors: Secondary | ICD-10-CM | POA: Diagnosis not present

## 2019-01-09 DIAGNOSIS — Z7901 Long term (current) use of anticoagulants: Secondary | ICD-10-CM | POA: Diagnosis not present

## 2019-01-09 DIAGNOSIS — N183 Chronic kidney disease, stage 3 unspecified: Secondary | ICD-10-CM | POA: Diagnosis not present

## 2019-01-09 DIAGNOSIS — Z170 Estrogen receptor positive status [ER+]: Secondary | ICD-10-CM | POA: Insufficient documentation

## 2019-01-09 DIAGNOSIS — I130 Hypertensive heart and chronic kidney disease with heart failure and stage 1 through stage 4 chronic kidney disease, or unspecified chronic kidney disease: Secondary | ICD-10-CM | POA: Diagnosis not present

## 2019-01-09 DIAGNOSIS — Z794 Long term (current) use of insulin: Secondary | ICD-10-CM | POA: Diagnosis not present

## 2019-01-09 MED ORDER — ANASTROZOLE 1 MG PO TABS: 1.0000 mg | ORAL_TABLET | Freq: Every day | ORAL | 3 refills | Status: DC

## 2019-01-09 NOTE — Assessment & Plan Note (Signed)
12/10/2015: Left breast biopsy: Grade 1-2 IDC with calcifications with DCIS, ER 100%, PR 90%, Ki-67 5%, HER-2 negative ratio 1.45; left breast mass 1.9 x 1 x 1.1 cm; T1 CN 0 stage IA clinical stage 01/19/2016 Left lumpectomy: IDC with calcifications, 1.8 cms, grade 2, DCIS with calcifications, 0/1 lymph node negative, T1 CN 0 stage IA Oncotype DX score 8: Risk of recurrence 6% Patient refusedradiation therapy  Current treatment:No role of systemic chemotherapy adjuvant antiestrogen therapy with anastrozole 1 mg by mouth daily 5 years,startedFebruary 2018.  Anastrozoletoxicities: 1. Intermittent hot flashes Denies any arthralgias or myalgias  Surveillance: 1.Breast exam  01/09/2019: Benign 2.Mammogram   12/20/2018: Breast density category B, no evidence of breast cancer.  Hospitalization for symptomatic bradycardia and atrial fibrillation 09/25/2017 to 09/27/2017 Return to clinic inone yearfor follow-up

## 2019-01-10 ENCOUNTER — Telehealth: Payer: Self-pay | Admitting: Hematology and Oncology

## 2019-01-10 NOTE — Telephone Encounter (Signed)
I could not reach patient, mailbox was full will mail °

## 2019-01-13 DIAGNOSIS — I130 Hypertensive heart and chronic kidney disease with heart failure and stage 1 through stage 4 chronic kidney disease, or unspecified chronic kidney disease: Secondary | ICD-10-CM | POA: Diagnosis not present

## 2019-01-28 DIAGNOSIS — N3941 Urge incontinence: Secondary | ICD-10-CM | POA: Diagnosis not present

## 2019-01-28 DIAGNOSIS — R350 Frequency of micturition: Secondary | ICD-10-CM | POA: Diagnosis not present

## 2019-01-28 DIAGNOSIS — N3020 Other chronic cystitis without hematuria: Secondary | ICD-10-CM | POA: Diagnosis not present

## 2019-02-03 DIAGNOSIS — E1129 Type 2 diabetes mellitus with other diabetic kidney complication: Secondary | ICD-10-CM | POA: Diagnosis not present

## 2019-03-04 ENCOUNTER — Encounter: Payer: Medicare Other | Admitting: Internal Medicine

## 2019-03-04 ENCOUNTER — Ambulatory Visit (INDEPENDENT_AMBULATORY_CARE_PROVIDER_SITE_OTHER): Payer: Medicare Other | Admitting: *Deleted

## 2019-03-04 DIAGNOSIS — I4821 Permanent atrial fibrillation: Secondary | ICD-10-CM | POA: Diagnosis not present

## 2019-03-04 LAB — CUP PACEART REMOTE DEVICE CHECK
Battery Remaining Longevity: 113 mo
Battery Voltage: 3.18 V
Brady Statistic AP VP Percent: 0.72 %
Brady Statistic AP VS Percent: 94.89 %
Brady Statistic AS VP Percent: 0 %
Brady Statistic AS VS Percent: 4.39 %
Brady Statistic RA Percent Paced: 97.29 %
Brady Statistic RV Percent Paced: 0.72 %
Date Time Interrogation Session: 20201222074931
Implantable Lead Implant Date: 20200921
Implantable Lead Implant Date: 20200921
Implantable Lead Location: 753858
Implantable Lead Location: 753860
Implantable Lead Model: 3830
Implantable Lead Model: 5076
Implantable Pulse Generator Implant Date: 20200921
Lead Channel Impedance Value: 361 Ohm
Lead Channel Impedance Value: 380 Ohm
Lead Channel Impedance Value: 456 Ohm
Lead Channel Impedance Value: 513 Ohm
Lead Channel Sensing Intrinsic Amplitude: 10.125 mV
Lead Channel Sensing Intrinsic Amplitude: 10.125 mV
Lead Channel Sensing Intrinsic Amplitude: 9.75 mV
Lead Channel Sensing Intrinsic Amplitude: 9.75 mV
Lead Channel Setting Pacing Amplitude: 3.5 V
Lead Channel Setting Pacing Amplitude: 3.5 V
Lead Channel Setting Pacing Pulse Width: 0.4 ms
Lead Channel Setting Sensing Sensitivity: 1.2 mV

## 2019-03-19 DIAGNOSIS — N3941 Urge incontinence: Secondary | ICD-10-CM | POA: Diagnosis not present

## 2019-03-19 DIAGNOSIS — R350 Frequency of micturition: Secondary | ICD-10-CM | POA: Diagnosis not present

## 2019-03-20 ENCOUNTER — Encounter: Payer: Self-pay | Admitting: Internal Medicine

## 2019-03-20 ENCOUNTER — Ambulatory Visit (INDEPENDENT_AMBULATORY_CARE_PROVIDER_SITE_OTHER): Payer: Medicare Other | Admitting: Internal Medicine

## 2019-03-20 ENCOUNTER — Other Ambulatory Visit: Payer: Self-pay

## 2019-03-20 VITALS — BP 162/90 | HR 82 | Ht 64.5 in | Wt 219.6 lb

## 2019-03-20 DIAGNOSIS — R001 Bradycardia, unspecified: Secondary | ICD-10-CM | POA: Diagnosis not present

## 2019-03-20 DIAGNOSIS — Z950 Presence of cardiac pacemaker: Secondary | ICD-10-CM

## 2019-03-20 NOTE — Patient Instructions (Signed)
Medication Instructions:  Your physician recommends that you continue on your current medications as directed. Please refer to the Current Medication list given to you today.  Labwork: None ordered.  Testing/Procedures: None ordered.  Follow-Up: Your physician wants you to follow-up in: one year with Dr. Lovena Le.   You will receive a reminder letter in the mail two months in advance. If you don't receive a letter, please call our office to schedule the follow-up appointment.  Remote monitoring is used to monitor your Pacemaker from home. This monitoring reduces the number of office visits required to check your device to one time per year. It allows [place] to keep an eye on the functioning of your device to ensure it is working properly. You are scheduled for a device check from home on 06/02/2019. You may send your transmission at any time that day. If you have a wireless device, the transmission will be sent automatically. After your physician reviews your transmission, you will receive a postcard with your next transmission date.  Any Other Special Instructions Will Be Listed Below (If Applicable).  If you need a refill on your cardiac medications before your next appointment, please call your pharmacy.

## 2019-03-20 NOTE — Progress Notes (Signed)
HPI [name] returns today for followup. She is a pleasant 85 yo woman with symptomatic bradycardia due to CHB, s/p PPM insertion. She has a h/o syncope. She has done well with her PM insertion. She denies chest pain or sob.  Allergies  Allergen Reactions  . Bee Venom Shortness Of Breath, Nausea And Vomiting and Other (See Comments)    Makes the patient feel faint, also  . Penicillins Anaphylaxis, Hives, Swelling and Other (See Comments)    Has patient had a PCN reaction causing immediate rash, facial/tongue/throat swelling, SOB or lightheadedness with hypotension: Yes Has patient had a PCN reaction causing severe rash involving mucus membranes or skin necrosis: No Has patient had a PCN reaction that required hospitalization: No Has patient had a PCN reaction occurring within the last 10 years: No If all of the above answers are "NO", then may proceed with Cephalosporin use.      Current Outpatient Medications  Medication Sig Dispense Refill  . anastrozole (ARIMIDEX) 1 MG tablet Take 1 tablet (1 mg total) by mouth daily. 90 tablet 3  . apixaban (ELIQUIS) 5 MG TABS tablet Take 1 tablet (5 mg total) by mouth 2 (two) times daily. 60 tablet   . BD PEN NEEDLE NANO U/F 32G X 4 MM MISC 1 application by Other route daily.     . diphenhydramine-acetaminophen (TYLENOL PM) 25-500 MG TABS tablet Take 1 tablet by mouth at bedtime as needed (for sleep).    . ferrous sulfate 325 (65 FE) MG tablet Take 325 mg by mouth daily with breakfast.      . furosemide (LASIX) 20 MG tablet Take 1 tablet (20 mg total) by mouth daily. 90 tablet 3  . glimepiride (AMARYL) 2 MG tablet Take 2 mg by mouth daily with breakfast.     . levothyroxine (SYNTHROID) 137 MCG tablet Take 137 mcg by mouth daily.    Marland Kitchen losartan (COZAAR) 100 MG tablet Take 100 mg by mouth daily.    . metFORMIN (GLUCOPHAGE) 850 MG tablet Take 850 mg by mouth 2 (two) times daily with a meal.     . metoprolol succinate (TOPROL XL) 25 MG 24 hr  tablet Take 1 tablet (25 mg total) by mouth daily. 90 tablet 3  . Multiple Vitamins-Minerals (MULTIVITAMIN WITH MINERALS) tablet Take 1 tablet by mouth daily.      . naproxen sodium (ALEVE) 220 MG tablet Take 220 mg by mouth 2 (two) times daily as needed (for pain or headaches).    . ONE TOUCH ULTRA TEST test strip daily.     Nelva Nay SOLOSTAR 300 UNIT/ML SOPN Inject 15 Units into the skin See admin instructions. Inject 15 units into the skin in the morning before breakfast and 15 units at bedtime  1   No current facility-administered medications for this visit.     Past Medical History:  Diagnosis Date  . Atrial fibrillation ([place])   . Breast cancer ([place])   . Breast cancer, left ([place])   . Chronic kidney disease    stage III - patient was unaware  . Diabetes mellitus   . Dyspnea   . Dysrhythmia    Afib  . H/O: hysterectomy   . History of colonoscopy 01/25/2010  . History of mammogram 08/04/2009  . Hyperlipidemia   . Hypertension   . Hypothyroidism   . Ketoacidosis, diabetic, no coma, non-insulin dependent    Type II  . Vitamin B12 deficiency     ROS:  All systems reviewed and negative except as noted in the HPI.   Past Surgical History:  Procedure Laterality Date  . ABDOMINAL HYSTERECTOMY    . Bilateral foot surgery    . BREAST EXCISIONAL BIOPSY    . BREAST LUMPECTOMY Left    2017  . BREAST LUMPECTOMY WITH RADIOACTIVE SEED AND SENTINEL LYMPH NODE BIOPSY Left 01/19/2016   Procedure: LEFT BREAST LUMPECTOMY WITH RADIOACTIVE SEED AND SENTINEL LYMPH NODE BIOPSY;  Surgeon: Autumn Messing III, MD;  Location: [facility];  Service: General;  Laterality: Left;  . BREAST LUMPECTOMY WITH RADIOACTIVE SEED LOCALIZATION Left 01/19/2016  . CARDIOVERSION  02/27/2011   Procedure: CARDIOVERSION;  Surgeon: Loralie Champagne, MD;  Location: [facility];  Service: Cardiovascular;  Laterality: N/A;  . LAPAROSCOPIC CHOLECYSTECTOMY    . PACEMAKER IMPLANT N/A 12/02/2018   Procedure: PACEMAKER IMPLANT;  Surgeon:  Evans Lance, MD;  Location: [facility];  Service: Cardiovascular;  Laterality: N/A;  . ROTATOR CUFF REPAIR Left   . TUBAL LIGATION       Family History  Problem Relation Age of Onset  . Heart failure Father 82       enlarged heart  . Pneumonia Mother 38  . Diabetes Mother 5  . Breast cancer Sister   . Cancer Neg Hx      Social History   Socioeconomic History  . Marital status: Married    Spouse name: Not on file  . Number of children: 2  . Years of education: Not on file  . Highest education level: Not on file  Occupational History    Employer: OTHER    Comment: Worked at [employer]  . Smoking status: Never Smoker  . Smokeless tobacco: Never Used  Substance and Sexual Activity  . Alcohol use: No    Alcohol/week: 0.0 standard drinks  . Drug use: No  . Sexual activity: Not on file  Other Topics Concern  . Not on file  Social History Narrative   Patient since 7   Husband with prostate cancer   10-siblings-no cancer   Social Determinants of Health   Financial Resource Strain:   . Difficulty of Paying Living Expenses: Not on file  Food Insecurity:   . Worried About Charity fundraiser in the Last Year: Not on file  . Ran Out of Food in the Last Year: Not on file  Transportation Needs:   . Lack of Transportation (Medical): Not on file  . Lack of Transportation (Non-Medical): Not on file  Physical Activity:   . Days of Exercise per Week: Not on file  . Minutes of Exercise per Session: Not on file  Stress:   . Feeling of Stress : Not on file  Social Connections:   . Frequency of Communication with Friends and Family: Not on file  . Frequency of Social Gatherings with Friends and Family: Not on file  . Attends Religious Services: Not on file  . Active Member of Clubs or Organizations: Not on file  . Attends Archivist Meetings: Not on file  . Marital Status: Not on file  Intimate Partner Violence:   . Fear of Current or  Ex-Partner: Not on file  . Emotionally Abused: Not on file  . Physically Abused: Not on file  . Sexually Abused: Not on file     BP (!) 162/90   Pulse 82   Ht 5' 4.5" (1.638 m)   Wt 219 lb 9.6 oz (99.6 kg)   LMP  (  LMP Unknown)   SpO2 97%   BMI 37.11 kg/m   Physical Exam:  Well appearing NAD HEENT: Unremarkable Neck:  No JVD, no thyromegally Lymphatics:  No adenopathy Back:  No CVA tenderness Lungs:  Clear HEART:  Regular rate rhythm, no murmurs, no rubs, no clicks Abd:  soft, positive bowel sounds, no organomegally, no rebound, no guarding Ext:  2 plus pulses, no edema, no cyanosis, no clubbing Skin:  No rashes no nodules Neuro:  CN II through XII intact, motor grossly intact  EKG - atrial fib with pacing induced RBBB  DEVICE  Normal device function.  See PaceArt for details.   Assess/Plan: 1. Atrial fib - her VR is well controlled. No change in meds.  2. CHB - she is pacing her His lead about 98% of the time. She is asymptomatic 3. Diastolic ChF - her symptoms remain class 2. She will continue her current meds. 4. Obesity - I encouraged the patient to lose weight. Specifically to get down to or under 210.  Mikle Bosworth.D.

## 2019-03-21 DIAGNOSIS — R079 Chest pain, unspecified: Secondary | ICD-10-CM | POA: Diagnosis not present

## 2019-03-21 DIAGNOSIS — R0602 Shortness of breath: Secondary | ICD-10-CM | POA: Diagnosis not present

## 2019-03-21 DIAGNOSIS — E1340 Other specified diabetes mellitus with diabetic neuropathy, unspecified: Secondary | ICD-10-CM | POA: Diagnosis not present

## 2019-03-21 DIAGNOSIS — I10 Essential (primary) hypertension: Secondary | ICD-10-CM | POA: Diagnosis not present

## 2019-03-21 DIAGNOSIS — R001 Bradycardia, unspecified: Secondary | ICD-10-CM | POA: Diagnosis not present

## 2019-03-21 DIAGNOSIS — I498 Other specified cardiac arrhythmias: Secondary | ICD-10-CM | POA: Diagnosis not present

## 2019-03-21 DIAGNOSIS — I429 Cardiomyopathy, unspecified: Secondary | ICD-10-CM | POA: Diagnosis not present

## 2019-03-21 DIAGNOSIS — Z950 Presence of cardiac pacemaker: Secondary | ICD-10-CM | POA: Diagnosis not present

## 2019-03-21 DIAGNOSIS — E7800 Pure hypercholesterolemia, unspecified: Secondary | ICD-10-CM | POA: Diagnosis not present

## 2019-03-24 ENCOUNTER — Ambulatory Visit (INDEPENDENT_AMBULATORY_CARE_PROVIDER_SITE_OTHER): Payer: Medicare Other | Admitting: Cardiovascular Disease

## 2019-03-24 ENCOUNTER — Other Ambulatory Visit: Payer: Self-pay

## 2019-03-24 ENCOUNTER — Encounter: Payer: Self-pay | Admitting: Cardiovascular Disease

## 2019-03-24 VITALS — BP 156/88 | HR 72 | Ht 64.5 in | Wt 221.8 lb

## 2019-03-24 DIAGNOSIS — I5032 Chronic diastolic (congestive) heart failure: Secondary | ICD-10-CM

## 2019-03-24 DIAGNOSIS — R001 Bradycardia, unspecified: Secondary | ICD-10-CM | POA: Diagnosis not present

## 2019-03-24 DIAGNOSIS — I340 Nonrheumatic mitral (valve) insufficiency: Secondary | ICD-10-CM | POA: Diagnosis not present

## 2019-03-24 DIAGNOSIS — I4821 Permanent atrial fibrillation: Secondary | ICD-10-CM

## 2019-03-24 NOTE — Progress Notes (Signed)
Chief Complaint  Summer Hawkins presents with  . Follow-up    atrial fibrillation    History of Present Illness: 85 yo female with with history of persistent atrial fibrillation, bradycardia, DM, hypothyroidism, HTN and hyperlipidemia today for cardiac followup. She was found to have atrial fibrillation in 2012. She had no awareness of palpitations. No stress induced ischemia on nuclear study. Low risk study. Normal LVEF. I started Toprol XL and Pradaxa. She has since been changed to Eliquis. Echo on 01/12/11 with normal LV size and function, LVEF of 55-60%, mild LVH, moderate TR, mild MR. She was cardioverted on 02/27/11. She was seen in primary care in May 2019 and  dyspnea and weakness. HR was in the 40s on 06/22/17 with atrial fib. Her Toprol dose was reduced to 25 mg daily. She was felt to be volume overloaded and was started on Lasix 20 mg daily. I saw her in our office 07/13/17 and her heart rate was improved. Her symptoms had resolved. 48 hour cardiac monitor May 2019 showed atrial fib with lowest heart rate 57 bpm, PVCs. Echo May 2019 with LvEF=55-60%, no wall motion abnormalities. No significant valve disease. She was admitted to [facility] July 16-18, 2019 with symptomatic bradycardia and decompensated CHF. Her heart rate was in the 40s. Her beta blocker was stopped and her heart rate improved. She was diuresed with IV Lasix. Norvasc added due to uncontrolled HTN. She was seen in EP clinic November 2019 by Dr. Lennie Odor and he did not feel that a pacemaker was indicated. Event monitor November 2019 with no high grade AV block. She was seen in the ED 11/15/18 after a syncopal event and was found to be in atrial fibrillation. She was orthostatic at office follow up 11/21/18. She was readmitted with syncope and bradycardia 11/30/18 and had a pacemaker placed. No recurrent syncope following pacemaker placement.   She is here today for follow up. The Summer Hawkins denies any chest pain, dyspnea, palpitations, lower extremity  edema, orthopnea, PND, dizziness, near syncope or syncope.   Primary Care Physician: Haywood Pao, MD  Past Medical History:  Diagnosis Date  . Atrial fibrillation ([place])   . Breast cancer ([place])   . Breast cancer, left ([place])   . Chronic kidney disease    stage III - Summer Hawkins was unaware  . Diabetes mellitus   . Dyspnea   . Dysrhythmia    Afib  . H/O: hysterectomy   . History of colonoscopy 01/25/2010  . History of mammogram 08/04/2009  . Hyperlipidemia   . Hypertension   . Hypothyroidism   . Ketoacidosis, diabetic, no coma, non-insulin dependent    Type II  . Vitamin B12 deficiency     Past Surgical History:  Procedure Laterality Date  . ABDOMINAL HYSTERECTOMY    . Bilateral foot surgery    . BREAST EXCISIONAL BIOPSY    . BREAST LUMPECTOMY Left    2017  . BREAST LUMPECTOMY WITH RADIOACTIVE SEED AND SENTINEL LYMPH NODE BIOPSY Left 01/19/2016   Procedure: LEFT BREAST LUMPECTOMY WITH RADIOACTIVE SEED AND SENTINEL LYMPH NODE BIOPSY;  Surgeon: Autumn Messing III, MD;  Location: [facility];  Service: General;  Laterality: Left;  . BREAST LUMPECTOMY WITH RADIOACTIVE SEED LOCALIZATION Left 01/19/2016  . CARDIOVERSION  02/27/2011   Procedure: CARDIOVERSION;  Surgeon: Loralie Champagne, MD;  Location: [facility];  Service: Cardiovascular;  Laterality: N/A;  . LAPAROSCOPIC CHOLECYSTECTOMY    . PACEMAKER IMPLANT N/A 12/02/2018   Procedure: PACEMAKER IMPLANT;  Surgeon: Evans Lance, MD;  Location: [facility];  Service: Cardiovascular;  Laterality: N/A;  . ROTATOR CUFF REPAIR Left   . TUBAL LIGATION      Current Outpatient Medications  Medication Sig Dispense Refill  . anastrozole (ARIMIDEX) 1 MG tablet Take 1 tablet (1 mg total) by mouth daily. 90 tablet 3  . apixaban (ELIQUIS) 5 MG TABS tablet Take 1 tablet (5 mg total) by mouth 2 (two) times daily. 60 tablet   . BD PEN NEEDLE NANO U/F 32G X 4 MM MISC 1 application by Other route daily.     . diphenhydramine-acetaminophen (TYLENOL PM)  25-500 MG TABS tablet Take 1 tablet by mouth at bedtime as needed (for sleep).    . ferrous sulfate 325 (65 FE) MG tablet Take 325 mg by mouth daily with breakfast.      . furosemide (LASIX) 20 MG tablet Take 20 mg by mouth as needed.    . irbesartan (AVAPRO) 300 MG tablet Take 300 mg by mouth daily.    Marland Kitchen levothyroxine (SYNTHROID) 137 MCG tablet Take 137 mcg by mouth daily.    . metFORMIN (GLUCOPHAGE) 850 MG tablet Take 850 mg by mouth 2 (two) times daily with a meal.     . metoprolol succinate (TOPROL XL) 25 MG 24 hr tablet Take 1 tablet (25 mg total) by mouth daily. 90 tablet 3  . Multiple Vitamins-Minerals (MULTIVITAMIN WITH MINERALS) tablet Take 1 tablet by mouth daily.      . naproxen sodium (ALEVE) 220 MG tablet Take 220 mg by mouth 2 (two) times daily as needed (for pain or headaches).    . ONE TOUCH ULTRA TEST test strip daily.     Nelva Nay SOLOSTAR 300 UNIT/ML SOPN Inject 15 Units into the skin See admin instructions. Inject 15 units into the skin in the morning before breakfast and 10 units at bedtime  1   No current facility-administered medications for this visit.    Allergies  Allergen Reactions  . Bee Venom Shortness Of Breath, Nausea And Vomiting and Other (See Comments)    Makes the Summer Hawkins feel faint, also  . Penicillins Anaphylaxis, Hives, Swelling and Other (See Comments)    Has Summer Hawkins had a PCN reaction causing immediate rash, facial/tongue/throat swelling, SOB or lightheadedness with hypotension: Yes Has Summer Hawkins had a PCN reaction causing severe rash involving mucus membranes or skin necrosis: No Has Summer Hawkins had a PCN reaction that required hospitalization: No Has Summer Hawkins had a PCN reaction occurring within the last 10 years: No If all of the above answers are "NO", then may proceed with Cephalosporin use.     Social History   Socioeconomic History  . Marital status: Married    Spouse name: Not on file  . Number of children: 2  . Years of education: Not on file   . Highest education level: Not on file  Occupational History    Employer: OTHER    Comment: Worked at [employer]  . Smoking status: Never Smoker  . Smokeless tobacco: Never Used  Substance and Sexual Activity  . Alcohol use: No    Alcohol/week: 0.0 standard drinks  . Drug use: No  . Sexual activity: Not on file  Other Topics Concern  . Not on file  Social History Narrative   Summer Hawkins since 4   Husband with prostate cancer   10-siblings-no cancer   Social Determinants of Health   Financial Resource Strain:   . Difficulty of Paying Living Expenses: Not on file  Food Insecurity:   . Worried About Charity fundraiser in the Last Year: Not on file  . Ran Out of Food in the Last Year: Not on file  Transportation Needs:   . Lack of Transportation (Medical): Not on file  . Lack of Transportation (Non-Medical): Not on file  Physical Activity:   . Days of Exercise per Week: Not on file  . Minutes of Exercise per Session: Not on file  Stress:   . Feeling of Stress : Not on file  Social Connections:   . Frequency of Communication with Friends and Family: Not on file  . Frequency of Social Gatherings with Friends and Family: Not on file  . Attends Religious Services: Not on file  . Active Member of Clubs or Organizations: Not on file  . Attends Archivist Meetings: Not on file  . Marital Status: Not on file  Intimate Partner Violence:   . Fear of Current or Ex-Partner: Not on file  . Emotionally Abused: Not on file  . Physically Abused: Not on file  . Sexually Abused: Not on file    Family History  Problem Relation Age of Onset  . Heart failure Father 25       enlarged heart  . Pneumonia Mother 16  . Diabetes Mother 64  . Breast cancer Sister   . Cancer Neg Hx     Review of Systems:  As stated in the HPI and otherwise negative.   BP (!) 156/88   Pulse 72   Ht 5' 4.5" (1.638 m)   Wt 221 lb 12.8 oz (100.6 kg)   LMP  (LMP Unknown)   SpO2 98%    BMI 37.48 kg/m   Physical Examination:  General: Well developed, well nourished, NAD  HEENT: OP clear, mucus membranes moist  SKIN: warm, dry. No rashes. Neuro: No focal deficits  Musculoskeletal: Muscle strength 5/5 all ext  Psychiatric: Mood and affect normal  Neck: No JVD, no carotid bruits, no thyromegaly, no lymphadenopathy.  Lungs:Clear bilaterally, no wheezes, rhonci, crackles Cardiovascular: Irreg irreg. Regular rate and rhythm. No murmurs, gallops or rubs. Abdomen:Soft. Bowel sounds present. Non-tender.  Extremities: No lower extremity edema. Pulses are 2 + in the bilateral DP/PT.  Echo May 2019: - Left ventricle: The cavity size was normal. Wall thickness was   increased in a pattern of mild LVH. Systolic function was normal.   The estimated ejection fraction was in the range of 55% to 60%.   Wall motion was normal; there were no regional wall motion   abnormalities. - Mitral valve: Mildly to moderately calcified annulus. Mildly   thickened leaflets . - Left atrium: The atrium was mildly dilated. - Pulmonary arteries: Systolic pressure was mildly increased. PA   peak pressure: 36 mm Hg (S).  EKG:  EKG is not ordered today. The ekg ordered today demonstrates   Recent Labs: 11/21/2018: TSH 0.781 11/30/2018: B Natriuretic Peptide 349.0 12/01/2018: BUN 26; Creatinine, Ser 1.39; Potassium 4.4; Sodium 139 12/02/2018: Hemoglobin 10.2; Magnesium 1.8; Platelets 247   Lipid Panel No results found for: CHOL, TRIG, HDL, CHOLHDL, VLDL, LDLCALC, LDLDIRECT   Wt Readings from Last 3 Encounters:  03/24/19 221 lb 12.8 oz (100.6 kg)  03/20/19 219 lb 9.6 oz (99.6 kg)  01/09/19 219 lb 14.4 oz (99.7 kg)     Other studies Reviewed: Additional studies/ records that were reviewed today include: . Review of the above records demonstrates:    Assessment and Plan:   1. Atrial  fibrillation, permanent/Bradycardia: Atrial fib today by exam. Rate controlled. Permanent pacemaker is in  place. Continue Toprol and Eliquis.    2. HTN: BP is elevated today but she has recently had a new BP medication in primary care (Losartan stopped and Irbesartan was started). She has plans for f/u of her BP next week in primary care.   3. Mitral valve insufficiency: Trivial by echo May 2019  4. Chronic diastolic CHF: Weight is stable. No volume overload on exam. Continue Lasix as needed.   Current medicines are reviewed at length with the Summer Hawkins today.  The Summer Hawkins does not have concerns regarding medicines.  The following changes have been made:  no change  Labs/ tests ordered today include:   No orders of the defined types were placed in this encounter.  Signed, Lauree Chandler, MD 03/24/2019 3:23 PM    [facility] [place], [place], [place]  [address] Phone: [phone]; Fax: [phone]

## 2019-03-24 NOTE — Patient Instructions (Signed)

## 2019-03-26 DIAGNOSIS — E538 Deficiency of other specified B group vitamins: Secondary | ICD-10-CM | POA: Diagnosis not present

## 2019-03-26 DIAGNOSIS — E1129 Type 2 diabetes mellitus with other diabetic kidney complication: Secondary | ICD-10-CM | POA: Diagnosis not present

## 2019-03-26 DIAGNOSIS — E7800 Pure hypercholesterolemia, unspecified: Secondary | ICD-10-CM | POA: Diagnosis not present

## 2019-03-26 DIAGNOSIS — E038 Other specified hypothyroidism: Secondary | ICD-10-CM | POA: Diagnosis not present

## 2019-03-28 DIAGNOSIS — R82998 Other abnormal findings in urine: Secondary | ICD-10-CM | POA: Diagnosis not present

## 2019-03-28 DIAGNOSIS — I130 Hypertensive heart and chronic kidney disease with heart failure and stage 1 through stage 4 chronic kidney disease, or unspecified chronic kidney disease: Secondary | ICD-10-CM | POA: Diagnosis not present

## 2019-04-02 DIAGNOSIS — Z7901 Long term (current) use of anticoagulants: Secondary | ICD-10-CM | POA: Diagnosis not present

## 2019-04-02 DIAGNOSIS — R809 Proteinuria, unspecified: Secondary | ICD-10-CM | POA: Diagnosis not present

## 2019-04-02 DIAGNOSIS — Z1339 Encounter for screening examination for other mental health and behavioral disorders: Secondary | ICD-10-CM | POA: Diagnosis not present

## 2019-04-02 DIAGNOSIS — E1129 Type 2 diabetes mellitus with other diabetic kidney complication: Secondary | ICD-10-CM | POA: Diagnosis not present

## 2019-04-02 DIAGNOSIS — I498 Other specified cardiac arrhythmias: Secondary | ICD-10-CM | POA: Diagnosis not present

## 2019-04-02 DIAGNOSIS — Z1331 Encounter for screening for depression: Secondary | ICD-10-CM | POA: Diagnosis not present

## 2019-04-02 DIAGNOSIS — I4891 Unspecified atrial fibrillation: Secondary | ICD-10-CM | POA: Diagnosis not present

## 2019-04-02 DIAGNOSIS — Z794 Long term (current) use of insulin: Secondary | ICD-10-CM | POA: Diagnosis not present

## 2019-04-02 DIAGNOSIS — Z0000 Encounter for general adult medical examination without abnormal findings: Secondary | ICD-10-CM | POA: Diagnosis not present

## 2019-04-02 DIAGNOSIS — I509 Heart failure, unspecified: Secondary | ICD-10-CM | POA: Diagnosis not present

## 2019-04-02 DIAGNOSIS — C50412 Malignant neoplasm of upper-outer quadrant of left female breast: Secondary | ICD-10-CM | POA: Diagnosis not present

## 2019-04-02 DIAGNOSIS — I130 Hypertensive heart and chronic kidney disease with heart failure and stage 1 through stage 4 chronic kidney disease, or unspecified chronic kidney disease: Secondary | ICD-10-CM | POA: Diagnosis not present

## 2019-04-02 DIAGNOSIS — Z950 Presence of cardiac pacemaker: Secondary | ICD-10-CM | POA: Diagnosis not present

## 2019-04-02 DIAGNOSIS — N1832 Chronic kidney disease, stage 3b: Secondary | ICD-10-CM | POA: Diagnosis not present

## 2019-04-03 DIAGNOSIS — E113393 Type 2 diabetes mellitus with moderate nonproliferative diabetic retinopathy without macular edema, bilateral: Secondary | ICD-10-CM | POA: Diagnosis not present

## 2019-04-03 DIAGNOSIS — H35372 Puckering of macula, left eye: Secondary | ICD-10-CM | POA: Diagnosis not present

## 2019-04-03 DIAGNOSIS — H43811 Vitreous degeneration, right eye: Secondary | ICD-10-CM | POA: Diagnosis not present

## 2019-04-03 DIAGNOSIS — H353131 Nonexudative age-related macular degeneration, bilateral, early dry stage: Secondary | ICD-10-CM | POA: Diagnosis not present

## 2019-04-28 ENCOUNTER — Other Ambulatory Visit: Payer: Self-pay

## 2019-04-28 ENCOUNTER — Ambulatory Visit: Payer: Medicare Other | Attending: Internal Medicine

## 2019-04-28 DIAGNOSIS — Z23 Encounter for immunization: Secondary | ICD-10-CM

## 2019-04-28 NOTE — Progress Notes (Signed)
   Covid-19 Vaccination Clinic  Name:  Summer Hawkins    MRN: [id number] DOB: 1934-11-09  04/28/2019  Summer Hawkins was observed post Covid-19 immunization for 15 minutes without incidence. She was provided with Vaccine Information Sheet and instruction to access the V-Safe system.   Summer Hawkins was instructed to call 911 with any severe reactions post vaccine: Marland Kitchen Difficulty breathing  . Swelling of your face and throat  . A fast heartbeat  . A bad rash all over your body  . Dizziness and weakness    Immunizations Administered    Name Date Dose VIS Date Route   Moderna COVID-19 Vaccine 04/28/2019 10:20 AM 0.5 mL 02/11/2019 Intramuscular   Manufacturer: Moderna   Lot: [id number]   [id number]

## 2019-05-27 ENCOUNTER — Ambulatory Visit: Payer: Medicare Other | Attending: Internal Medicine

## 2019-05-27 DIAGNOSIS — Z23 Encounter for immunization: Secondary | ICD-10-CM

## 2019-05-27 NOTE — Progress Notes (Signed)
   Covid-19 Vaccination Clinic  Name:  DOMINICK STONEMAN    MRN: [id number] DOB: Jan 31, 1935  05/27/2019  Ms. Noffke was observed post Covid-19 immunization for 30 minutes based on pre-vaccination screening without incident. She was provided with Vaccine Information Sheet and instruction to access the V-Safe system.   Ms. Loughman was instructed to call 911 with any severe reactions post vaccine: Marland Kitchen Difficulty breathing  . Swelling of face and throat  . A fast heartbeat  . A bad rash all over body  . Dizziness and weakness   Immunizations Administered    Name Date Dose VIS Date Route   Moderna COVID-19 Vaccine 05/27/2019  9:39 AM 0.5 mL 02/11/2019 Intramuscular   Manufacturer: Moderna   Lot: [id number]   [place]PO:[id number]

## 2019-06-02 ENCOUNTER — Ambulatory Visit (INDEPENDENT_AMBULATORY_CARE_PROVIDER_SITE_OTHER): Payer: Medicare Other | Admitting: *Deleted

## 2019-06-02 DIAGNOSIS — I4821 Permanent atrial fibrillation: Secondary | ICD-10-CM

## 2019-06-02 LAB — CUP PACEART REMOTE DEVICE CHECK
Battery Remaining Longevity: 135 mo
Battery Voltage: 3.14 V
Brady Statistic AP VP Percent: 0.14 %
Brady Statistic AP VS Percent: 99.79 %
Brady Statistic AS VP Percent: 0 %
Brady Statistic AS VS Percent: 0.08 %
Brady Statistic RA Percent Paced: 99.92 %
Brady Statistic RV Percent Paced: 0.14 %
Date Time Interrogation Session: 20210322064920
Implantable Lead Implant Date: 20200921
Implantable Lead Implant Date: 20200921
Implantable Lead Location: 753858
Implantable Lead Location: 753860
Implantable Lead Model: 3830
Implantable Lead Model: 5076
Implantable Pulse Generator Implant Date: 20200921
Lead Channel Impedance Value: 342 Ohm
Lead Channel Impedance Value: 380 Ohm
Lead Channel Impedance Value: 418 Ohm
Lead Channel Impedance Value: 494 Ohm
Lead Channel Sensing Intrinsic Amplitude: 11.25 mV
Lead Channel Sensing Intrinsic Amplitude: 11.25 mV
Lead Channel Sensing Intrinsic Amplitude: 9.75 mV
Lead Channel Sensing Intrinsic Amplitude: 9.75 mV
Lead Channel Setting Pacing Amplitude: 2.5 V
Lead Channel Setting Pacing Amplitude: 2.5 V
Lead Channel Setting Pacing Pulse Width: 0.4 ms
Lead Channel Setting Sensing Sensitivity: 1.2 mV

## 2019-06-03 NOTE — Progress Notes (Signed)
PPM Remote  

## 2019-09-01 ENCOUNTER — Ambulatory Visit (INDEPENDENT_AMBULATORY_CARE_PROVIDER_SITE_OTHER): Payer: Medicare Other | Admitting: *Deleted

## 2019-09-01 DIAGNOSIS — I4821 Permanent atrial fibrillation: Secondary | ICD-10-CM | POA: Diagnosis not present

## 2019-09-01 LAB — CUP PACEART REMOTE DEVICE CHECK
Battery Remaining Longevity: 132 mo
Battery Voltage: 3.07 V
Brady Statistic AP VP Percent: 0.11 %
Brady Statistic AP VS Percent: 99.85 %
Brady Statistic AS VP Percent: 0 %
Brady Statistic AS VS Percent: 0.04 %
Brady Statistic RA Percent Paced: 99.97 %
Brady Statistic RV Percent Paced: 0.11 %
Date Time Interrogation Session: 20210620184559
Implantable Lead Implant Date: 20200921
Implantable Lead Implant Date: 20200921
Implantable Lead Location: 753858
Implantable Lead Location: 753860
Implantable Lead Model: 3830
Implantable Lead Model: 5076
Implantable Pulse Generator Implant Date: 20200921
Lead Channel Impedance Value: 323 Ohm
Lead Channel Impedance Value: 380 Ohm
Lead Channel Impedance Value: 399 Ohm
Lead Channel Impedance Value: 494 Ohm
Lead Channel Sensing Intrinsic Amplitude: 11.25 mV
Lead Channel Sensing Intrinsic Amplitude: 11.25 mV
Lead Channel Sensing Intrinsic Amplitude: 9.75 mV
Lead Channel Sensing Intrinsic Amplitude: 9.75 mV
Lead Channel Setting Pacing Amplitude: 2.5 V
Lead Channel Setting Pacing Amplitude: 2.5 V
Lead Channel Setting Pacing Pulse Width: 0.4 ms
Lead Channel Setting Sensing Sensitivity: 1.2 mV

## 2019-09-02 NOTE — Progress Notes (Signed)
Remote pacemaker transmission.   

## 2019-09-25 ENCOUNTER — Other Ambulatory Visit: Payer: Self-pay

## 2019-09-25 ENCOUNTER — Encounter (INDEPENDENT_AMBULATORY_CARE_PROVIDER_SITE_OTHER): Payer: Self-pay | Admitting: Ophthalmology

## 2019-09-25 ENCOUNTER — Ambulatory Visit (INDEPENDENT_AMBULATORY_CARE_PROVIDER_SITE_OTHER): Payer: Medicare Other | Admitting: Ophthalmology

## 2019-09-25 DIAGNOSIS — E113393 Type 2 diabetes mellitus with moderate nonproliferative diabetic retinopathy without macular edema, bilateral: Secondary | ICD-10-CM | POA: Insufficient documentation

## 2019-09-25 DIAGNOSIS — H353131 Nonexudative age-related macular degeneration, bilateral, early dry stage: Secondary | ICD-10-CM | POA: Diagnosis not present

## 2019-09-25 DIAGNOSIS — H353132 Nonexudative age-related macular degeneration, bilateral, intermediate dry stage: Secondary | ICD-10-CM | POA: Insufficient documentation

## 2019-09-25 NOTE — Progress Notes (Signed)
09/25/2019     CHIEF COMPLAINT Patient presents for Retina Follow Up   HISTORY OF PRESENT ILLNESS: Summer Hawkins is a 85 y.o. female who presents to the clinic today for:   HPI    Retina Follow Up    Patient presents with  Dry AMD.  In both eyes.  This started 6 months ago.  Duration of 6 months.  Since onset it is stable.          Comments    6 month f/u dilated exam and OCT(MAC) today.  Pt states her near vision has gotten worse since last visit.  LBS was 140 this a.m and A1C was 6.8       Last edited by Melburn Popper, COA on 09/25/2019  9:37 AM. (History)      Referring physician: Haywood Pao, MD [place],  [place] [id number]  HISTORICAL INFORMATION:   Selected notes from the [facility]    Lab Results  Component Value Date   HGBA1C 6.9 (H) 11/21/2018     CURRENT MEDICATIONS: No current outpatient medications on file. (Ophthalmic Drugs)   No current facility-administered medications for this visit. (Ophthalmic Drugs)   Current Outpatient Medications (Other)  Medication Sig  . anastrozole (ARIMIDEX) 1 MG tablet Take 1 tablet (1 mg total) by mouth daily.  Marland Kitchen apixaban (ELIQUIS) 5 MG TABS tablet Take 1 tablet (5 mg total) by mouth 2 (two) times daily.  . BD PEN NEEDLE NANO U/F 32G X 4 MM MISC 1 application by Other route daily.   . diphenhydramine-acetaminophen (TYLENOL PM) 25-500 MG TABS tablet Take 1 tablet by mouth at bedtime as needed (for sleep).  . ferrous sulfate 325 (65 FE) MG tablet Take 325 mg by mouth daily with breakfast.    . furosemide (LASIX) 20 MG tablet Take 20 mg by mouth as needed.  . irbesartan (AVAPRO) 300 MG tablet Take 300 mg by mouth daily.  Marland Kitchen levothyroxine (SYNTHROID) 137 MCG tablet Take 137 mcg by mouth daily.  . metFORMIN (GLUCOPHAGE) 850 MG tablet Take 850 mg by mouth 2 (two) times daily with a meal.   . metoprolol succinate (TOPROL XL) 25 MG 24 hr tablet Take 1 tablet (25 mg total) by mouth daily.    . Multiple Vitamins-Minerals (MULTIVITAMIN WITH MINERALS) tablet Take 1 tablet by mouth daily.    . naproxen sodium (ALEVE) 220 MG tablet Take 220 mg by mouth 2 (two) times daily as needed (for pain or headaches).  . ONE TOUCH ULTRA TEST test strip daily.   Nelva Nay SOLOSTAR 300 UNIT/ML SOPN Inject 15 Units into the skin See admin instructions. Inject 15 units into the skin in the morning before breakfast and 10 units at bedtime   No current facility-administered medications for this visit. (Other)      REVIEW OF SYSTEMS:    ALLERGIES Allergies  Allergen Reactions  . Bee Venom Shortness Of Breath, Nausea And Vomiting and Other (See Comments)    Makes the patient feel faint, also  . Penicillins Anaphylaxis, Hives, Swelling and Other (See Comments)    Has patient had a PCN reaction causing immediate rash, facial/tongue/throat swelling, SOB or lightheadedness with hypotension: Yes Has patient had a PCN reaction causing severe rash involving mucus membranes or skin necrosis: No Has patient had a PCN reaction that required hospitalization: No Has patient had a PCN reaction occurring within the last 10 years: No If all of the above answers are "NO", then  may proceed with Cephalosporin use.     PAST MEDICAL HISTORY Past Medical History:  Diagnosis Date  . Atrial fibrillation ([place])   . Breast cancer ([place])   . Breast cancer, left ([place])   . Chronic kidney disease    stage III - patient was unaware  . Diabetes mellitus   . Dyspnea   . Dysrhythmia    Afib  . H/O: hysterectomy   . History of colonoscopy 01/25/2010  . History of mammogram 08/04/2009  . Hyperlipidemia   . Hypertension   . Hypothyroidism   . Ketoacidosis, diabetic, no coma, non-insulin dependent    Type II  . Vitamin B12 deficiency    Past Surgical History:  Procedure Laterality Date  . ABDOMINAL HYSTERECTOMY    . Bilateral foot surgery    . BREAST EXCISIONAL BIOPSY    . BREAST LUMPECTOMY Left    2017  .  BREAST LUMPECTOMY WITH RADIOACTIVE SEED AND SENTINEL LYMPH NODE BIOPSY Left 01/19/2016   Procedure: LEFT BREAST LUMPECTOMY WITH RADIOACTIVE SEED AND SENTINEL LYMPH NODE BIOPSY;  Surgeon: Autumn Messing III, MD;  Location: [facility];  Service: General;  Laterality: Left;  . BREAST LUMPECTOMY WITH RADIOACTIVE SEED LOCALIZATION Left 01/19/2016  . CARDIOVERSION  02/27/2011   Procedure: CARDIOVERSION;  Surgeon: Loralie Champagne, MD;  Location: [facility];  Service: Cardiovascular;  Laterality: N/A;  . LAPAROSCOPIC CHOLECYSTECTOMY    . PACEMAKER IMPLANT N/A 12/02/2018   Procedure: PACEMAKER IMPLANT;  Surgeon: Evans Lance, MD;  Location: [facility];  Service: Cardiovascular;  Laterality: N/A;  . ROTATOR CUFF REPAIR Left   . TUBAL LIGATION      FAMILY HISTORY Family History  Problem Relation Age of Onset  . Heart failure Father 37       enlarged heart  . Pneumonia Mother 29  . Diabetes Mother 70  . Breast cancer Sister   . Cancer Neg Hx     SOCIAL HISTORY Social History   Tobacco Use  . Smoking status: Never Smoker  . Smokeless tobacco: Never Used  Vaping Use  . Vaping Use: Never used  Substance Use Topics  . Alcohol use: No    Alcohol/week: 0.0 standard drinks  . Drug use: No         OPHTHALMIC EXAM:  Base Eye Exam    Visual Acuity (ETDRS)      Right Left   Dist cc 20/25 20/25 -2   Correction: Glasses       Tonometry (Tonopen, 9:42 AM)      Right Left   Pressure 18 19       Pupils      Pupils Dark Light Shape React APD   Right PERRL 4 3 Round Slow None   Left PERRL 4 3 Round Slow None       Visual Fields (Counting fingers)      Left Right    Full Full       Extraocular Movement      Right Left    Full Full       Neuro/Psych    Oriented x3: Yes   Mood/Affect: Normal       Dilation    Both eyes: 1.0% Mydriacyl, 2.5% Phenylephrine @ 9:42 AM        Slit Lamp and Fundus Exam    External Exam      Right Left   External Normal Normal       Slit Lamp  Exam  Right Left   Lids/Lashes Normal Normal   Conjunctiva/Sclera White and quiet White and quiet   Cornea Clear Clear   Anterior Chamber Deep and quiet Deep and quiet   Iris Round and reactive Round and reactive   Lens Posterior chamber intraocular lens Posterior chamber intraocular lens   Anterior Vitreous Normal Normal       Fundus Exam      Right Left   Posterior Vitreous Posterior vitreous detachment Posterior vitreous detachment   Disc Normal Normal   C/D Ratio 0.7 0.4   Macula Hard drusen, no macular thickening, no hemorrhage, no exudates Hard drusen, no macular thickening, no hemorrhage, no exudates   Vessels Normal, , no DR Normal, , no DR   Periphery Normal Normal          IMAGING AND PROCEDURES  Imaging and Procedures for 09/25/19  OCT, Retina - OU - Both Eyes       Right Eye Quality was good. Scan locations included subfoveal. Central Foveal Thickness: 259. Progression has been stable. Findings include abnormal foveal contour, retinal drusen .   Left Eye Quality was good. Scan locations included subfoveal. Central Foveal Thickness: 268. Progression has been stable. Findings include retinal drusen , no SRF, no IRF.   Notes Diffuse thickening of Bruch's membrane, no ruptures of Bruch's membrane, no signs of CN VM in either eye.                ASSESSMENT/PLAN:  Early stage nonexudative age-related macular degeneration of both eyes The nature of age realated macular degeneration (ARMD)is explained as follows: The dry form refers to the progressive loss of normal blood supply to the central vision as a result of a combination of factors which include aging blood supply (arteriosclerosis, hardening of the arteries), genetics, smoking habits, and history of hypertension. Currently, no eye medications or vitamins slow this type of aging effect upon vision, however cessation of smoking and controlling hypertension help slow the disorder. The following analogy  helps explain this: I describe the dry form of ARMD like a house of the same age as your eyes, which shows typical wear and tear of age upon the house structure and appearance. Like the aging house which can fall down structurally, the dry form of ARMD can deteriorate the structure of the macula (center) of the retina, most often gradually and affect central vision tasks such as reading and driving. The wet form of ARMD refers to the development of abnormally growing blood vessels usually near or under the central vision, with potential risk of permanent visual changes or vision losses. This complication of the Dry form of ARMD may be moderately reduced by use of AREDS 2 formula multivitamins daily. I describe the Wet form of ARMD as like the development of a fire in an aging house (DRY ARMD). It may develop suddenly, progress rapidly and be destructive based on where it starts and how big the fire is when found. In the eye, the house fire analogy refers to the abnormal blood vessels growing destructively near the central vison in the retina, or film of the eye. Halting the growth of blood vessels with laser (hot or cold) or injectable medications is best way "to put the fire out". Many patients will experience a stabilization or even improvement in vision with a treatment course, while others may still face a loss of vision. The use of injectable medications has revolutionized therapy and is currently the only proven therapy to provide the chance  of stable or improved acuity from new and recent destructive wet ARMD.      ICD-10-CM   1. Early stage nonexudative age-related macular degeneration of both eyes  H35.3131 OCT, Retina - OU - Both Eyes  2. Moderate nonproliferative diabetic retinopathy of both eyes without macular edema associated with type 2 diabetes mellitus (HCC)  J69.6789 OCT, Retina - OU - Both Eyes    1.  2.  3.  Ophthalmic Meds Ordered this visit:  No orders of the defined types were  placed in this encounter.      Return in about 6 months (around 03/27/2020) for DILATE OU, OCT.  Patient Instructions  Age-Related Macular Degeneration  Age-related macular degeneration (AMD) is an eye disease related to aging. The disease causes a loss of central vision. Central vision allows a person to see objects clearly and do daily tasks like reading and driving. There are two main types of AMD:  Dry AMD. People with this type generally lose their vision slowly. This is the most common type of AMD. Some people with dry AMD notice very little change in their vision as they age.  Wet AMD. People with this type can lose their vision quickly. What are the causes? This condition is caused by damage to the part of the eye that provides you with central vision (macula).  Dry AMD happens when deposits in the macula cause light-sensitive cells to slowly break down.  Wet AMD happens when abnormal blood vessels grow under the macula and leak blood and fluid. What increases the risk? You are more likely to develop this condition if you:  Are 38 years old or older, and especially 27 years old or older.  Smoke.  Are obese.  Have a family history of AMD.  Have high cholesterol, high blood pressure, or heart disease.  Have been exposed to high levels of ultraviolet (UV) light and blue light.  Are white (Caucasian).  Are female. What are the signs or symptoms? Common symptoms of this condition include:  Blurred vision, especially when reading print material. The blurred vision often improves in brighter light.  A blurred or blind spot in the center of your field of vision that is small but growing larger.  Bright colors seeming less bright than they used to be.  Decreased ability to recognize and see faces.  One eye seeing worse than the other.  Decreased ability to adapt to dimly lit rooms.  Straight lines appearing crooked or wavy. How is this diagnosed? This condition  is diagnosed based on your symptoms and an eye exam. During the eye exam:  Eye drops will be placed into your eyes to enlarge (dilate) your pupils. This will allow your health care provider to see the back of your eye.  You may be asked to look at an image that looks like a checkerboard (Amsler grid). Early changes in your central vision may cause the grid to appear distorted. After the exam, you may be given one or both of these tests:  Fluorescein angiogram. This test determines whether you have dry or wet AMD.  Optical coherence tomography (OCT) test to evaluate deep layers of the retina. How is this treated? There is no cure for this condition, but treatment can help to slow down progression of the disease. This condition may be treated with:  Supplements, including vitamin C, vitamin E, beta carotene, and zinc.  Laser surgery to destroy new blood vessels or leaking blood vessels in your eye.  Injections  of medicines into your eye to slow down the formation of abnormal blood vessels that may leak. These injections may need to be repeated on a routine basis. Follow these instructions at home:  Take over-the-counter and prescription medicines only as told by your health care provider.  Take vitamins and supplements as told by your health care provider.  Ask your health care provider for an Amsler grid. Use it every day to check each eye for vision changes.  Get an eye exam as often as told by your health care provider. Make sure to get an eye exam at least once every year.  Keep all follow-up visits as told by your health care provider. This is important. Contact a health care provider if:  You notice any new changes in your vision. Get help right away if:  You suddenly lose vision or develop pain in the eye. Summary  Age-related macular degeneration (AMD) is an eye disease related to aging. There are two types of this condition: dry AMD and wet AMD.  This condition is caused  by damage to the part of the eye that provides you with central vision (macula).  Once diagnosed with AMD, make sure to get an eye exam every year, take supplements and vitamins as directed, use an Amsler grid at home, and follow up with your health care provider. This information is not intended to replace advice given to you by your health care provider. Make sure you discuss any questions you have with your health care provider. Document Revised: 09/05/2017 Document Reviewed: 09/05/2017 Elsevier Patient Education  [place] the diagnoses, plan, and follow up with the patient and they expressed understanding.  Patient expressed understanding of the importance of proper follow up care.   Clent Demark Osmel Dykstra M.D. Diseases & Surgery of the Retina and Vitreous [facility]ina [facility] Diabetic [facility] 09/25/19     Abbreviations: M myopia (nearsighted); A astigmatism; H hyperopia (farsighted); P presbyopia; Mrx spectacle prescription;  CTL contact lenses; OD right eye; OS left eye; OU both eyes  XT exotropia; ET esotropia; PEK punctate epithelial keratitis; PEE punctate epithelial erosions; DES dry eye syndrome; MGD meibomian gland dysfunction; ATs artificial tears; PFAT's preservative free artificial tears; [place] nuclear sclerotic cataract; PSC posterior subcapsular cataract; ERM epi-retinal membrane; PVD posterior vitreous detachment; RD retinal detachment; DM diabetes mellitus; DR diabetic retinopathy; NPDR non-proliferative diabetic retinopathy; PDR proliferative diabetic retinopathy; CSME clinically significant macular edema; DME diabetic macular edema; dbh dot blot hemorrhages; CWS cotton wool spot; POAG primary open angle glaucoma; C/D cup-to-disc ratio; HVF humphrey visual field; GVF goldmann visual field; OCT optical coherence tomography; IOP intraocular pressure; BRVO Branch retinal vein occlusion; CRVO central retinal vein occlusion; CRAO central retinal artery occlusion; BRAO branch  retinal artery occlusion; RT retinal tear; SB scleral buckle; PPV pars plana vitrectomy; VH Vitreous hemorrhage; PRP panretinal laser photocoagulation; IVK intravitreal kenalog; VMT vitreomacular traction; MH Macular hole;  NVD neovascularization of the disc; NVE neovascularization elsewhere; AREDS age related eye disease study; ARMD age related macular degeneration; POAG primary open angle glaucoma; EBMD epithelial/anterior basement membrane dystrophy; ACIOL anterior chamber intraocular lens; IOL intraocular lens; PCIOL posterior chamber intraocular lens; Phaco/IOL phacoemulsification with intraocular lens placement; [place] photorefractive keratectomy; LASIK laser assisted in situ keratomileusis; HTN hypertension; DM diabetes mellitus; COPD chronic obstructive pulmonary disease

## 2019-09-25 NOTE — Patient Instructions (Signed)
Age-Related Macular Degeneration  Age-related macular degeneration (AMD) is an eye disease related to aging. The disease causes a loss of central vision. Central vision allows a person to see objects clearly and do daily tasks like reading and driving. There are two main types of AMD:  Dry AMD. People with this type generally lose their vision slowly. This is the most common type of AMD. Some people with dry AMD notice very little change in their vision as they age.  Wet AMD. People with this type can lose their vision quickly. What are the causes? This condition is caused by damage to the part of the eye that provides you with central vision (macula).  Dry AMD happens when deposits in the macula cause light-sensitive cells to slowly break down.  Wet AMD happens when abnormal blood vessels grow under the macula and leak blood and fluid. What increases the risk? You are more likely to develop this condition if you:  Are 50 years old or older, and especially 75 years old or older.  Smoke.  Are obese.  Have a family history of AMD.  Have high cholesterol, high blood pressure, or heart disease.  Have been exposed to high levels of ultraviolet (UV) light and blue light.  Are white (Caucasian).  Are female. What are the signs or symptoms? Common symptoms of this condition include:  Blurred vision, especially when reading print material. The blurred vision often improves in brighter light.  A blurred or blind spot in the center of your field of vision that is small but growing larger.  Bright colors seeming less bright than they used to be.  Decreased ability to recognize and see faces.  One eye seeing worse than the other.  Decreased ability to adapt to dimly lit rooms.  Straight lines appearing crooked or wavy. How is this diagnosed? This condition is diagnosed based on your symptoms and an eye exam. During the eye exam:  Eye drops will be placed into your eyes to  enlarge (dilate) your pupils. This will allow your health care provider to see the back of your eye.  You may be asked to look at an image that looks like a checkerboard (Amsler grid). Early changes in your central vision may cause the grid to appear distorted. After the exam, you may be given one or both of these tests:  Fluorescein angiogram. This test determines whether you have dry or wet AMD.  Optical coherence tomography (OCT) test to evaluate deep layers of the retina. How is this treated? There is no cure for this condition, but treatment can help to slow down progression of the disease. This condition may be treated with:  Supplements, including vitamin C, vitamin E, beta carotene, and zinc.  Laser surgery to destroy new blood vessels or leaking blood vessels in your eye.  Injections of medicines into your eye to slow down the formation of abnormal blood vessels that may leak. These injections may need to be repeated on a routine basis. Follow these instructions at home:  Take over-the-counter and prescription medicines only as told by your health care provider.  Take vitamins and supplements as told by your health care provider.  Ask your health care provider for an Amsler grid. Use it every day to check each eye for vision changes.  Get an eye exam as often as told by your health care provider. Make sure to get an eye exam at least once every year.  Keep all follow-up visits as told by   your health care provider. This is important. Contact a health care provider if:  You notice any new changes in your vision. Get help right away if:  You suddenly lose vision or develop pain in the eye. Summary  Age-related macular degeneration (AMD) is an eye disease related to aging. There are two types of this condition: dry AMD and wet AMD.  This condition is caused by damage to the part of the eye that provides you with central vision (macula).  Once diagnosed with AMD, make sure  to get an eye exam every year, take supplements and vitamins as directed, use an Amsler grid at home, and follow up with your health care provider. This information is not intended to replace advice given to you by your health care provider. Make sure you discuss any questions you have with your health care provider. Document Revised: 09/05/2017 Document Reviewed: 09/05/2017 Elsevier Patient Education  2020 Elsevier Inc.  

## 2019-09-25 NOTE — Assessment & Plan Note (Signed)
The nature of age realated macular degeneration (ARMD)is explained as follows: The dry form refers to the progressive loss of normal blood supply to the central vision as a result of a combination of factors which include aging blood supply (arteriosclerosis, hardening of the arteries), genetics, smoking habits, and history of hypertension. Currently, no eye medications or vitamins slow this type of aging effect upon vision, however cessation of smoking and controlling hypertension help slow the disorder. The following analogy helps explain this: I describe the dry form of ARMD like a house of the same age as your eyes, which shows typical wear and tear of age upon the house structure and appearance. Like the aging house which can fall down structurally, the dry form of ARMD can deteriorate the structure of the macula (center) of the retina, most often gradually and affect central vision tasks such as reading and driving. The wet form of ARMD refers to the development of abnormally growing blood vessels usually near or under the central vision, with potential risk of permanent visual changes or vision losses. This complication of the Dry form of ARMD may be moderately reduced by use of AREDS 2 formula multivitamins daily. I describe the Wet form of ARMD as like the development of a fire in an aging house (DRY ARMD). It may develop suddenly, progress rapidly and be destructive based on where it starts and how big the fire is when found. In the eye, the house fire analogy refers to the abnormal blood vessels growing destructively near the central vison in the retina, or film of the eye. Halting the growth of blood vessels with laser (hot or cold) or injectable medications is best way "to put the fire out". Many patients will experience a stabilization or even improvement in vision with a treatment course, while others may still face a loss of vision. The use of injectable medications has revolutionized therapy and is  currently the only proven therapy to provide the chance of stable or improved acuity from new and recent destructive wet ARMD. 

## 2019-10-01 ENCOUNTER — Telehealth: Payer: Self-pay | Admitting: Hematology and Oncology

## 2019-10-01 NOTE — Telephone Encounter (Signed)
Rescheduled appointment per 7/1 message. Patient is aware of updated appointment date and time.

## 2019-10-27 DIAGNOSIS — H3500 Unspecified background retinopathy: Secondary | ICD-10-CM | POA: Diagnosis not present

## 2019-10-27 DIAGNOSIS — E113393 Type 2 diabetes mellitus with moderate nonproliferative diabetic retinopathy without macular edema, bilateral: Secondary | ICD-10-CM | POA: Diagnosis not present

## 2019-10-27 DIAGNOSIS — Z6835 Body mass index (BMI) 35.0-35.9, adult: Secondary | ICD-10-CM | POA: Diagnosis not present

## 2019-10-27 DIAGNOSIS — E1129 Type 2 diabetes mellitus with other diabetic kidney complication: Secondary | ICD-10-CM | POA: Diagnosis not present

## 2019-10-27 DIAGNOSIS — N1832 Chronic kidney disease, stage 3b: Secondary | ICD-10-CM | POA: Diagnosis not present

## 2019-10-27 DIAGNOSIS — Z794 Long term (current) use of insulin: Secondary | ICD-10-CM | POA: Diagnosis not present

## 2019-10-27 DIAGNOSIS — E039 Hypothyroidism, unspecified: Secondary | ICD-10-CM | POA: Diagnosis not present

## 2019-10-27 DIAGNOSIS — C50412 Malignant neoplasm of upper-outer quadrant of left female breast: Secondary | ICD-10-CM | POA: Diagnosis not present

## 2019-10-27 DIAGNOSIS — Z7901 Long term (current) use of anticoagulants: Secondary | ICD-10-CM | POA: Diagnosis not present

## 2019-10-27 DIAGNOSIS — I509 Heart failure, unspecified: Secondary | ICD-10-CM | POA: Diagnosis not present

## 2019-10-27 DIAGNOSIS — I130 Hypertensive heart and chronic kidney disease with heart failure and stage 1 through stage 4 chronic kidney disease, or unspecified chronic kidney disease: Secondary | ICD-10-CM | POA: Diagnosis not present

## 2019-11-11 ENCOUNTER — Telehealth: Payer: Self-pay | Admitting: Internal Medicine

## 2019-11-11 NOTE — Telephone Encounter (Signed)
Chart erroneously sent to pre-op, no active pre-op entries ongoing -> believe this was meant for triage so will route. Brooklyne Radke PA-C

## 2019-11-11 NOTE — Telephone Encounter (Signed)
Patient reports she has had SOB since 11/08/19 that increases at night when she is laying flat. She reports that she has experiences no CP, diaphoresis, nausea, vomiting, or diarrhea. She reports no edema in BLE but reports edema in her hands and around her eyes. She reports no cough , or fever. She has not taken any Lasix because she reports it is prescribed on prn basis. She reports an episode of dizziness with position change last 2 days ago. She reports she can not feel her pacemaker working and is concerned it has "stopped working".Remote transmission sent and reviewed. Presenting EGM shows AP/ VS @ 70 bpm, PPM function WNL., no events or episodes of elevated HR or AF/AFL since last transmission 08/31/19. Will forward for cardiology for evaluation.

## 2019-11-11 NOTE — Telephone Encounter (Signed)
Called patient with Dr. Camillia Herter recommendations. Patient will take lasix 40 mg by mouth for three days and monitor her weight. Encouraged patient to keep track of her daily weights and call the office if she gains more than 3 lbs in a day or 5 lbs in one week. Patient verbalized understanding.

## 2019-11-11 NOTE — Telephone Encounter (Signed)
° ° °  Pt c/o Shortness Of Breath: STAT if SOB developed within the last 24 hours or pt is noticeably SOB on the phone  1. Are you currently SOB (can you hear that pt is SOB on the phone)? No  2. How long have you been experiencing SOB? 3 days ago  3. Are you SOB when sitting or when up moving around?   4. Are you currently experiencing any other symptoms? Dizziness, fatigue   Pt said, she thinks her pacemaker stopped working, she said 3 days ago her difficulty breathing started, she feel dizzy and fatigue, then she noticed she can't feel the pacemaker on her chest anymore. She is concern and doesn't know what to do  Please call patient

## 2019-11-11 NOTE — Telephone Encounter (Signed)
I would have her take Lasix 40 mg (2 of her 20 mg tablets) for the next 3 days and see if her breathing improves. Follow daily weights. Thank, chris

## 2019-11-26 IMAGING — MG MM DIGITAL DIAGNOSTIC BILAT W/ TOMO W/ CAD
8 of 13 series · 8 of 37 positions shown · non-contrast
Comparison: Previous exam(s).
COMPARISON: Previous exam(s).

Addendum:
CLINICAL DATA: A 4-year-old female presenting for routine annual
surveillance status post left breast lumpectomy in 5364.

EXAM:
DIGITAL DIAGNOSTIC BILATERAL MAMMOGRAM WITH CAD AND TOMO

[L MLO]
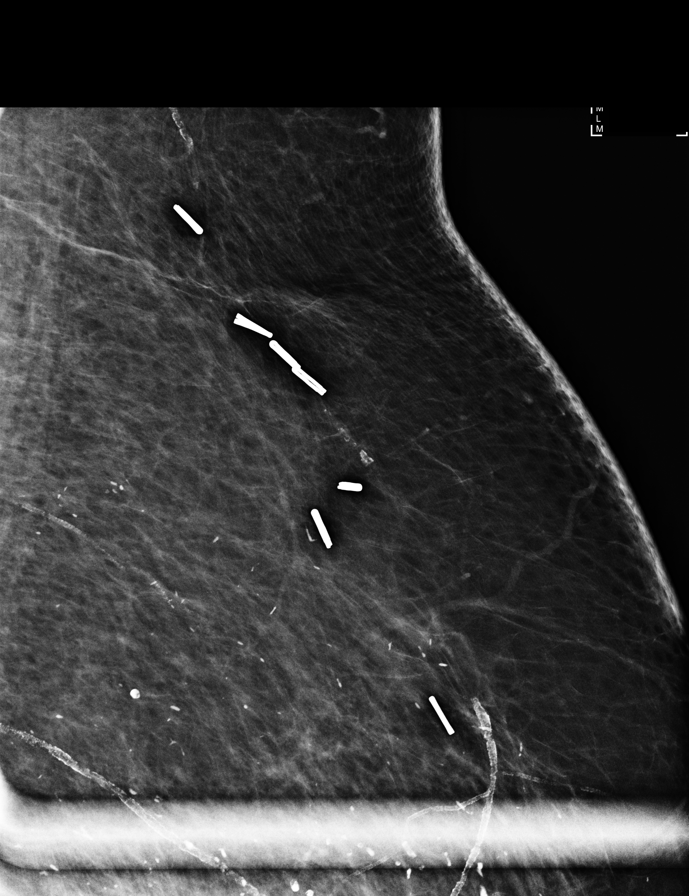

[L CC synth-2D (1 of 2)]
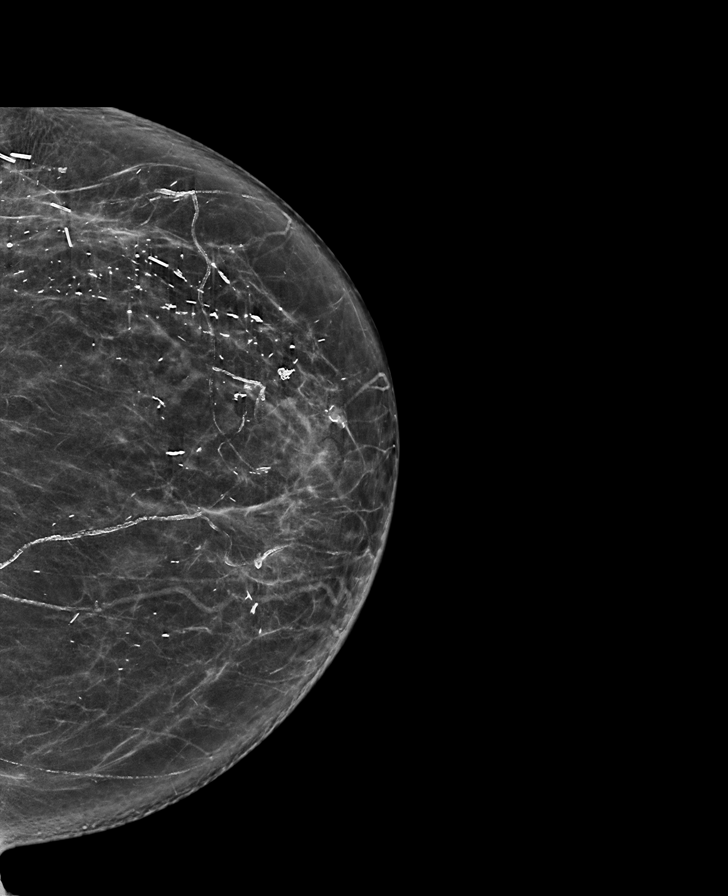

[L MLO synth-2D]
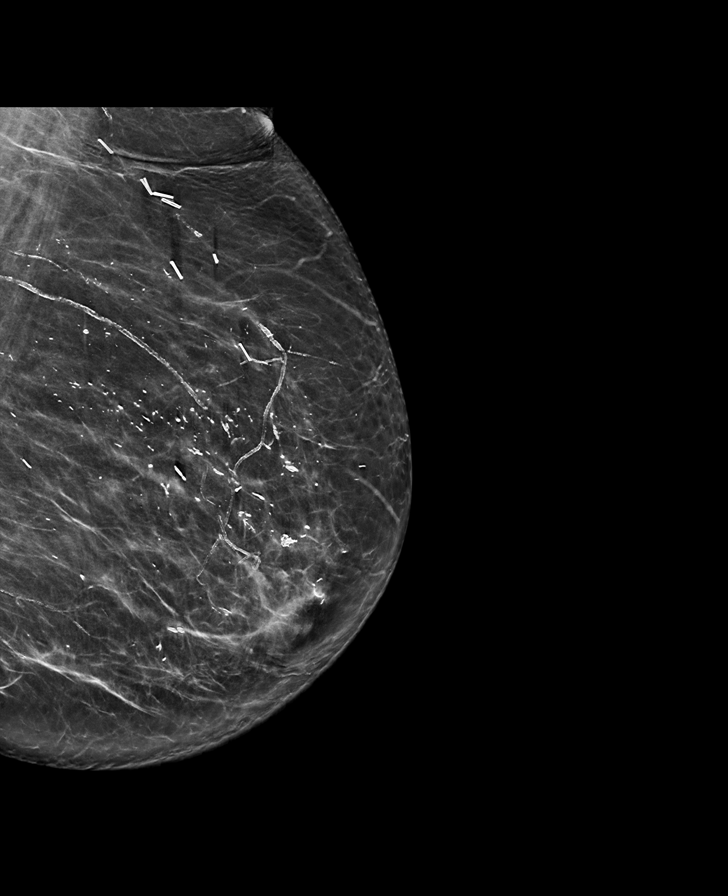

[R MLO synth-2D]
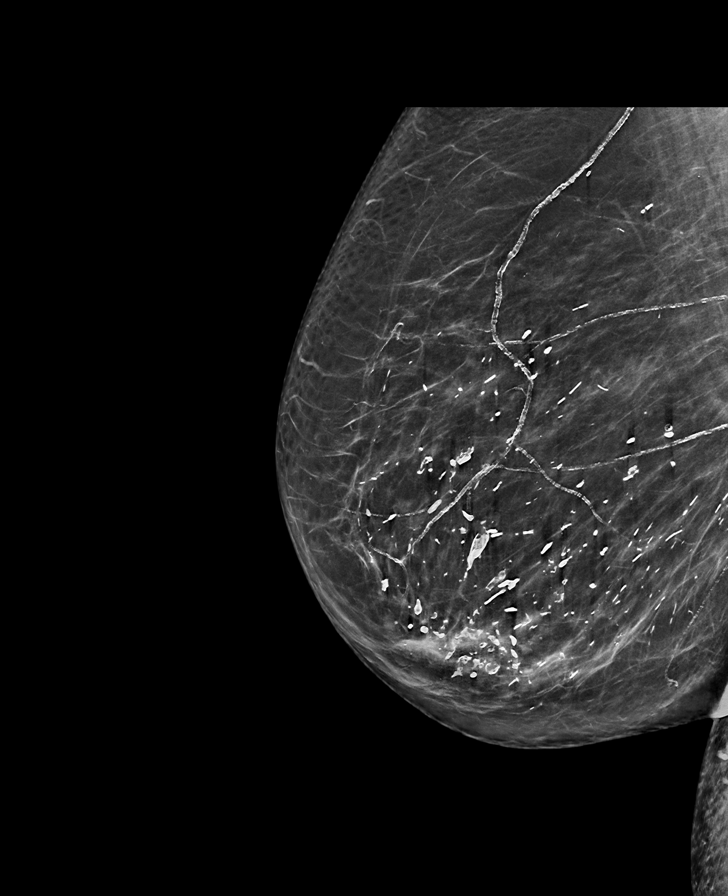

[R CC synth-2D (1 of 2)]
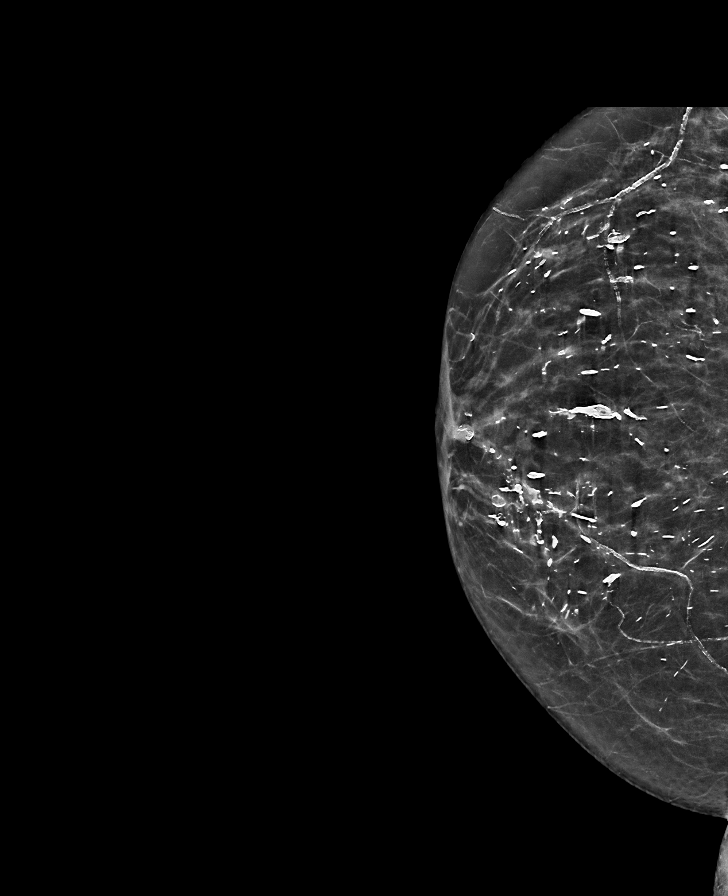

[L CC synth-2D (2 of 2)]
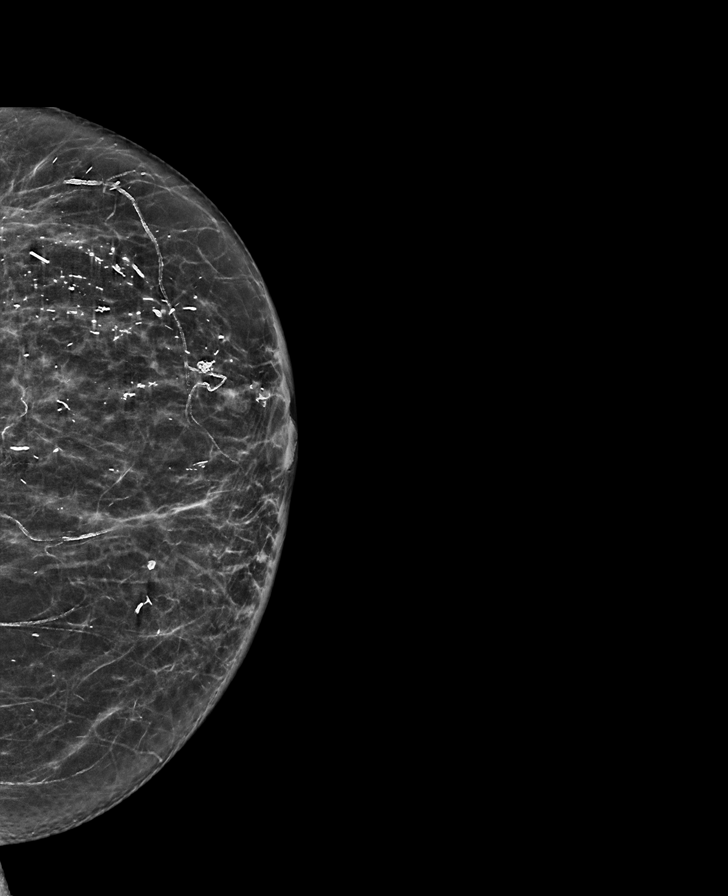

[R CC synth-2D (2 of 2)]
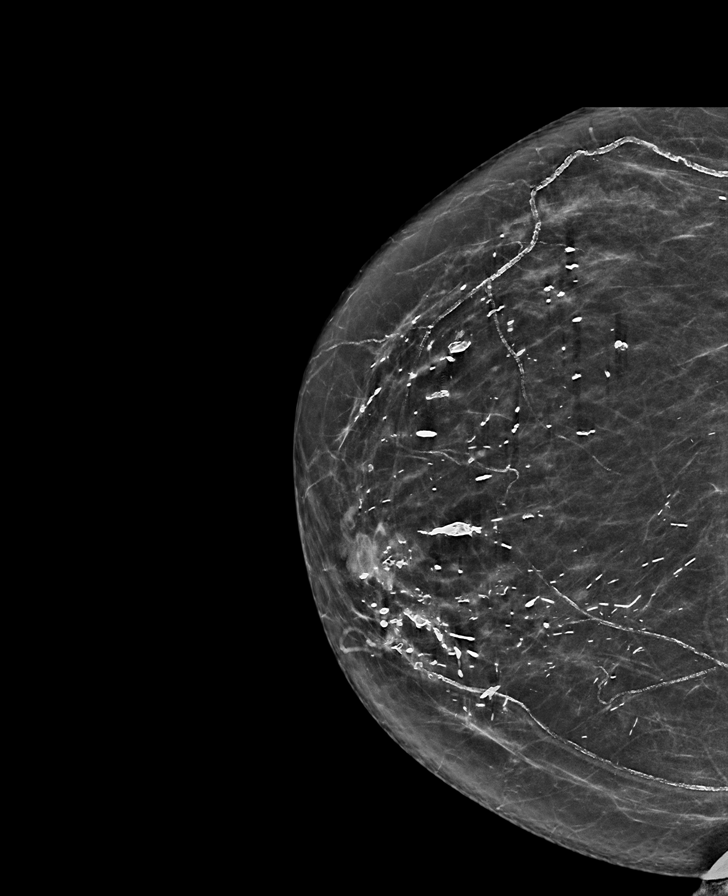

[L MLO tomo · tomo slice 41/81.0]
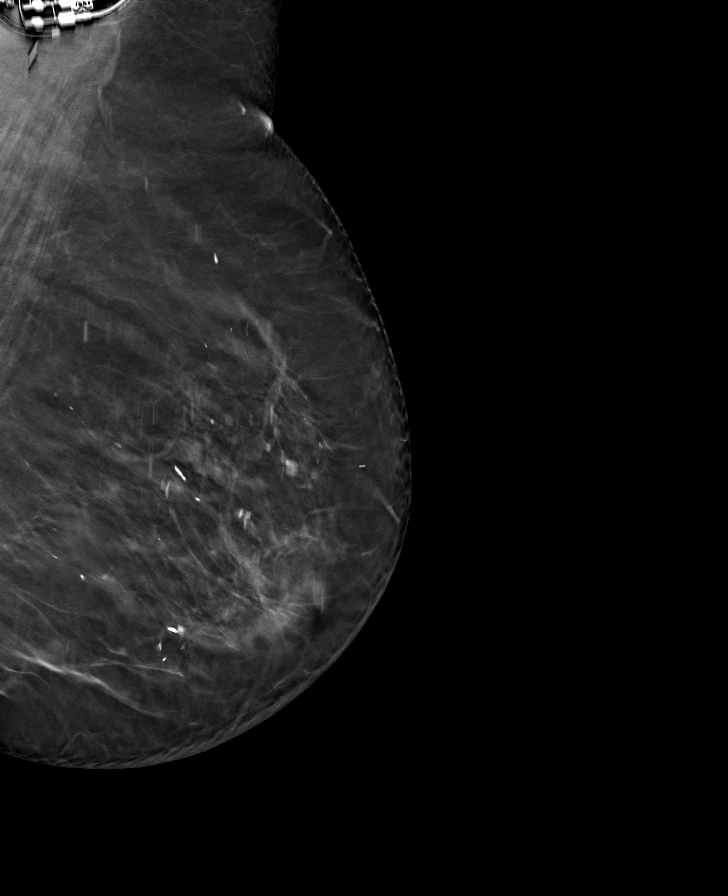

[8 of 37 positions shown; findings below may reference images not displayed]

ACR Breast Density Category b: There are scattered areas of
fibroglandular density.
FINDINGS: Left breast lumpectomy site is stable. No suspicious calcifications,
masses or areas of distortion are seen in the bilateral breasts.

Mammographic images were processed with CAD.
IMPRESSION: Stable left breast lumpectomy site. No mammographic evidence of
malignancy in the bilateral breasts.

RECOMMENDATION:
Diagnostic mammogram is suggested in 1 year. (Code:Z2-P-N08)

I have discussed the findings and recommendations with the patient.
If applicable, a reminder letter will be sent to the patient
regarding the next appointment.

BI-RADS CATEGORY  2: Benign.

ADDENDUM:
Correction to the clinical data: The patient is 84 years old.

*** End of Addendum ***
ACR Breast Density Category b: There are scattered areas of
fibroglandular density.
FINDINGS: Left breast lumpectomy site is stable. No suspicious calcifications,
masses or areas of distortion are seen in the bilateral breasts.

Mammographic images were processed with CAD.
IMPRESSION: Stable left breast lumpectomy site. No mammographic evidence of
malignancy in the bilateral breasts.

RECOMMENDATION:
Diagnostic mammogram is suggested in 1 year. (Code:Z2-P-N08)

I have discussed the findings and recommendations with the patient.
If applicable, a reminder letter will be sent to the patient
regarding the next appointment.

BI-RADS CATEGORY  2: Benign.

## 2019-12-01 ENCOUNTER — Ambulatory Visit (INDEPENDENT_AMBULATORY_CARE_PROVIDER_SITE_OTHER): Payer: Medicare Other | Admitting: *Deleted

## 2019-12-01 ENCOUNTER — Other Ambulatory Visit: Payer: Self-pay | Admitting: Hematology and Oncology

## 2019-12-01 DIAGNOSIS — I482 Chronic atrial fibrillation, unspecified: Secondary | ICD-10-CM

## 2019-12-01 DIAGNOSIS — Z853 Personal history of malignant neoplasm of breast: Secondary | ICD-10-CM

## 2019-12-02 LAB — CUP PACEART REMOTE DEVICE CHECK
Battery Remaining Longevity: 128 mo
Battery Voltage: 3.03 V
Brady Statistic AP VP Percent: 0.16 %
Brady Statistic AP VS Percent: 99.74 %
Brady Statistic AS VP Percent: 0 %
Brady Statistic AS VS Percent: 0.1 %
Brady Statistic RA Percent Paced: 99.9 %
Brady Statistic RV Percent Paced: 0.16 %
Date Time Interrogation Session: 20210920165433
Implantable Lead Implant Date: 20200921
Implantable Lead Implant Date: 20200921
Implantable Lead Location: 753858
Implantable Lead Location: 753860
Implantable Lead Model: 3830
Implantable Lead Model: 5076
Implantable Pulse Generator Implant Date: 20200921
Lead Channel Impedance Value: 342 Ohm
Lead Channel Impedance Value: 380 Ohm
Lead Channel Impedance Value: 437 Ohm
Lead Channel Impedance Value: 494 Ohm
Lead Channel Sensing Intrinsic Amplitude: 11.25 mV
Lead Channel Sensing Intrinsic Amplitude: 11.25 mV
Lead Channel Sensing Intrinsic Amplitude: 9.75 mV
Lead Channel Sensing Intrinsic Amplitude: 9.75 mV
Lead Channel Setting Pacing Amplitude: 2.5 V
Lead Channel Setting Pacing Amplitude: 2.5 V
Lead Channel Setting Pacing Pulse Width: 0.4 ms
Lead Channel Setting Sensing Sensitivity: 1.2 mV

## 2019-12-03 NOTE — Progress Notes (Signed)
Remote pacemaker transmission.   

## 2019-12-25 ENCOUNTER — Ambulatory Visit
Admission: RE | Admit: 2019-12-25 | Discharge: 2019-12-25 | Disposition: A | Discharge status: Home / Self Care | Payer: Medicare Other | Source: Ambulatory Visit | Attending: Hematology and Oncology | Admitting: Hematology and Oncology

## 2019-12-25 ENCOUNTER — Other Ambulatory Visit: Payer: Self-pay

## 2019-12-25 DIAGNOSIS — Z853 Personal history of malignant neoplasm of breast: Secondary | ICD-10-CM | POA: Diagnosis not present

## 2019-12-25 DIAGNOSIS — R928 Other abnormal and inconclusive findings on diagnostic imaging of breast: Secondary | ICD-10-CM | POA: Diagnosis not present

## 2020-01-05 NOTE — Progress Notes (Signed)
Patient Care Team: Tisovec, Fransico Him, MD as PCP - General (Internal Medicine) Burnell Blanks, MD as PCP - Cardiology (Cardiology) Jovita Kussmaul, MD as Consulting Physician (General Surgery) Nicholas Lose, MD as Consulting Physician (Hematology and Oncology) Gardenia Phlegm, NP as Nurse Practitioner (Hematology and Oncology)  DIAGNOSIS:    ICD-10-CM   1. Malignant neoplasm of upper-outer quadrant of left breast in female, estrogen receptor positive ([place])  C50.412    Z17.0     SUMMARY OF ONCOLOGIC HISTORY: Oncology History  Breast cancer of upper-outer quadrant of left female breast ([place])  12/15/2015 Initial Diagnosis   Left breast biopsy: Grade 1-2 IDC with calcifications with DCIS, ER 100%, PR 90%, Ki-67 5%, HER-2 negative ratio 1.45; left breast mass 1.9 x 1 x 1.1 cm; T1 CN 0 stage IA clinical stage   01/19/2016 Surgery   Left lumpectomy: IDC with calcifications, 1.8 cm, grade 2, DCIS with calcifications, 0/1 lymph node negative, T1 CN 0 stage IA   01/26/2016 Oncotype testing   Oncotype DX score 8: risk of recurrence 6%    Radiation Therapy   Refused radiation therapy   04/13/2016 -  Anti-estrogen oral therapy   Anastrozole 1 mg daily     CHIEF COMPLIANT: Follow-up of left breast cancer on anastrozole therapy  INTERVAL HISTORY: Summer Hawkins is a 85 y.o. with above-mentioned history of left breast cancer treated with lumpectomy, declined radiation, and is currently on anti-estrogen therapy with anastrozole. Mammogram on 12/25/19 showed no evidence of malignancy bilaterally. She presents to the clinic today for annual follow-up.   She is tolerating antiestrogen therapy extremely well without any problems or concerns.  She has completed 4 years of therapy.  ALLERGIES:  is allergic to bee venom and penicillins.  MEDICATIONS:  Current Outpatient Medications  Medication Sig Dispense Refill  . anastrozole (ARIMIDEX) 1 MG tablet Take 1 tablet (1 mg total) by  mouth daily. 90 tablet 3  . apixaban (ELIQUIS) 5 MG TABS tablet Take 1 tablet (5 mg total) by mouth 2 (two) times daily. 60 tablet   . BD PEN NEEDLE NANO U/F 32G X 4 MM MISC 1 application by Other route daily.     . diphenhydramine-acetaminophen (TYLENOL PM) 25-500 MG TABS tablet Take 1 tablet by mouth at bedtime as needed (for sleep).    . ferrous sulfate 325 (65 FE) MG tablet Take 325 mg by mouth daily with breakfast.      . furosemide (LASIX) 20 MG tablet Take 20 mg by mouth as needed.    . irbesartan (AVAPRO) 300 MG tablet Take 300 mg by mouth daily.    Marland Kitchen levothyroxine (SYNTHROID) 137 MCG tablet Take 137 mcg by mouth daily.    . metFORMIN (GLUCOPHAGE) 850 MG tablet Take 850 mg by mouth 2 (two) times daily with a meal.     . metoprolol succinate (TOPROL XL) 25 MG 24 hr tablet Take 1 tablet (25 mg total) by mouth daily. 90 tablet 3  . Multiple Vitamins-Minerals (MULTIVITAMIN WITH MINERALS) tablet Take 1 tablet by mouth daily.      . naproxen sodium (ALEVE) 220 MG tablet Take 220 mg by mouth 2 (two) times daily as needed (for pain or headaches).    . ONE TOUCH ULTRA TEST test strip daily.     Nelva Nay SOLOSTAR 300 UNIT/ML SOPN Inject 15 Units into the skin See admin instructions. Inject 15 units into the skin in the morning before breakfast and 10 units at bedtime  1   No current facility-administered medications for this visit.    PHYSICAL EXAMINATION: ECOG PERFORMANCE STATUS: 1 - Symptomatic but completely ambulatory  Vitals:   01/06/20 1420  BP: (!) 142/74  Pulse: 77  Resp: 18  Temp: 98.6 F (37 C)  SpO2: 94%   Filed Weights   01/06/20 1420  Weight: 211 lb 14.4 oz (96.1 kg)    BREAST: No palpable masses or nodules in either right or left breasts. No palpable axillary supraclavicular or infraclavicular adenopathy no breast tenderness or nipple discharge. (exam performed in the presence of a chaperone)  LABORATORY DATA:  I have reviewed the data as listed CMP Latest Ref Rng &  Units 12/01/2018 11/30/2018 11/22/2018  Glucose 70 - 99 mg/dL 148(H) 167(H) 257(H)  BUN 8 - 23 mg/dL 26(H) 29(H) 30(H)  Creatinine 0.44 - 1.00 mg/dL 1.39(H) 1.62(H) 1.82(H)  Sodium 135 - 145 mmol/L 139 138 135  Potassium 3.5 - 5.1 mmol/L 4.4 4.8 4.2  Chloride 98 - 111 mmol/L 106 106 101  CO2 22 - 32 mmol/L 22 21(L) 23  Calcium 8.9 - 10.3 mg/dL 9.0 9.3 9.0  Total Protein 6.5 - 8.1 g/dL - - -  Total Bilirubin 0.3 - 1.2 mg/dL - - -  Alkaline Phos 38 - 126 U/L - - -  AST 15 - 41 U/L - - -  ALT 0 - 44 U/L - - -    Lab Results  Component Value Date   WBC 10.9 (H) 12/02/2018   HGB 10.2 (L) 12/02/2018   HCT 31.3 (L) 12/02/2018   MCV 94.6 12/02/2018   PLT 247 12/02/2018   NEUTROABS 7.0 11/21/2018    ASSESSMENT & PLAN:  Breast cancer of upper-outer quadrant of left female breast ([place]) 12/10/2015: Left breast biopsy: Grade 1-2 IDC with calcifications with DCIS, ER 100%, PR 90%, Ki-67 5%, HER-2 negative ratio 1.45; left breast mass 1.9 x 1 x 1.1 cm; T1 CN 0 stage IA clinical stage 01/19/2016 Left lumpectomy: IDC with calcifications, 1.8 cms, grade 2, DCIS with calcifications, 0/1 lymph node negative, T1 CN 0 stage IA Oncotype DX score 8: Risk of recurrence 6% Patient refusedradiation therapy  Current treatment:No role of systemic chemotherapy adjuvant antiestrogen therapy with anastrozole 1 mg by mouth daily 5 years,startedFebruary 2018.  Anastrozoletoxicities: Denies any hot flashes or arthralgias or myalgias.  Surveillance: 1.Breast exam 01/06/2020:Benign 2.Mammogram10/14/2021: Breast density category B, no evidence of breast cancer.  Hospitalization for symptomatic bradycardia and atrial fibrillation 09/25/2017 to 09/27/2017; pacemaker implantation September 2020. Pacemaker implantation is made her feel amazingly better.  Return to clinic inone yearfor follow-up    No orders of the defined types were placed in this encounter.  The patient has a good understanding  of the overall plan. she agrees with it. she will call with any problems that may develop before the next visit here.  Total time spent: 20 mins including face to face time and time spent for planning, charting and coordination of care  Nicholas Lose, MD 01/06/2020  I, Cloyde Reams Dorshimer, am acting as scribe for Dr. Nicholas Lose.  I have reviewed the above documentation for accuracy and completeness, and I agree with the above.

## 2020-01-06 ENCOUNTER — Other Ambulatory Visit: Payer: Self-pay

## 2020-01-06 ENCOUNTER — Inpatient Hospital Stay: Payer: Medicare Other | Attending: Hematology and Oncology | Admitting: Hematology and Oncology

## 2020-01-06 DIAGNOSIS — Z79811 Long term (current) use of aromatase inhibitors: Secondary | ICD-10-CM | POA: Diagnosis not present

## 2020-01-06 DIAGNOSIS — C50412 Malignant neoplasm of upper-outer quadrant of left female breast: Secondary | ICD-10-CM | POA: Diagnosis not present

## 2020-01-06 DIAGNOSIS — Z170 Estrogen receptor positive status [ER+]: Secondary | ICD-10-CM

## 2020-01-06 DIAGNOSIS — Z950 Presence of cardiac pacemaker: Secondary | ICD-10-CM | POA: Diagnosis not present

## 2020-01-06 MED ORDER — ANASTROZOLE 1 MG PO TABS: 1.0000 mg | ORAL_TABLET | Freq: Every day | ORAL | 3 refills | Status: DC

## 2020-01-06 NOTE — Assessment & Plan Note (Signed)
12/10/2015: Left breast biopsy: Grade 1-2 IDC with calcifications with DCIS, ER 100%, PR 90%, Ki-67 5%, HER-2 negative ratio 1.45; left breast mass 1.9 x 1 x 1.1 cm; T1 CN 0 stage IA clinical stage 01/19/2016 Left lumpectomy: IDC with calcifications, 1.8 cms, grade 2, DCIS with calcifications, 0/1 lymph node negative, T1 CN 0 stage IA Oncotype DX score 8: Risk of recurrence 6% Patient refusedradiation therapy  Current treatment:No role of systemic chemotherapy adjuvant antiestrogen therapy with anastrozole 1 mg by mouth daily 5 years,startedFebruary 2018.  Anastrozoletoxicities: Denies any side effects to antiestrogen therapy.  Hot flashes have resolved. Denies any arthralgias or myalgias  Surveillance: 1.Breast exam 01/06/2020:Benign 2.Mammogram10/14/2021: Breast density category B, no evidence of breast cancer.  Hospitalization for symptomatic bradycardia and atrial fibrillation 09/25/2017 to 09/27/2017; pacemaker implantation September 2020. Patient feels remarkably better since that time.  Return to clinic inone yearfor follow-up

## 2020-01-09 ENCOUNTER — Ambulatory Visit: Payer: Medicare Other | Admitting: Hematology and Oncology

## 2020-02-19 DIAGNOSIS — Z23 Encounter for immunization: Secondary | ICD-10-CM | POA: Diagnosis not present

## 2020-02-24 DIAGNOSIS — L218 Other seborrheic dermatitis: Secondary | ICD-10-CM | POA: Diagnosis not present

## 2020-03-01 ENCOUNTER — Ambulatory Visit (INDEPENDENT_AMBULATORY_CARE_PROVIDER_SITE_OTHER): Payer: Medicare Other

## 2020-03-01 DIAGNOSIS — R001 Bradycardia, unspecified: Secondary | ICD-10-CM

## 2020-03-01 LAB — CUP PACEART REMOTE DEVICE CHECK
Battery Remaining Longevity: 126 mo
Battery Voltage: 3.02 V
Brady Statistic AP VP Percent: 0.09 %
Brady Statistic AP VS Percent: 99.86 %
Brady Statistic AS VP Percent: 0 %
Brady Statistic AS VS Percent: 0.05 %
Brady Statistic RA Percent Paced: 99.95 %
Brady Statistic RV Percent Paced: 0.09 %
Date Time Interrogation Session: 20211220081657
Implantable Lead Implant Date: 20200921
Implantable Lead Implant Date: 20200921
Implantable Lead Location: 753858
Implantable Lead Location: 753860
Implantable Lead Model: 3830
Implantable Lead Model: 5076
Implantable Pulse Generator Implant Date: 20200921
Lead Channel Impedance Value: 323 Ohm
Lead Channel Impedance Value: 380 Ohm
Lead Channel Impedance Value: 418 Ohm
Lead Channel Impedance Value: 513 Ohm
Lead Channel Sensing Intrinsic Amplitude: 11.25 mV
Lead Channel Sensing Intrinsic Amplitude: 11.25 mV
Lead Channel Sensing Intrinsic Amplitude: 9.75 mV
Lead Channel Sensing Intrinsic Amplitude: 9.75 mV
Lead Channel Setting Pacing Amplitude: 2.5 V
Lead Channel Setting Pacing Amplitude: 2.5 V
Lead Channel Setting Pacing Pulse Width: 0.4 ms
Lead Channel Setting Sensing Sensitivity: 1.2 mV

## 2020-03-15 NOTE — Progress Notes (Signed)
Remote pacemaker transmission.   

## 2020-03-22 ENCOUNTER — Telehealth: Payer: Self-pay | Admitting: *Deleted
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
## 2020-03-29 ENCOUNTER — Telehealth: Payer: Self-pay | Admitting: *Deleted

## 2020-03-29 ENCOUNTER — Encounter (INDEPENDENT_AMBULATORY_CARE_PROVIDER_SITE_OTHER): Payer: Medicare Other | Admitting: Ophthalmology

## 2020-03-29 DIAGNOSIS — E538 Deficiency of other specified B group vitamins: Secondary | ICD-10-CM | POA: Diagnosis not present

## 2020-03-29 NOTE — Telephone Encounter (Signed)
[id number] home -spoke to [place] did call granddaughter who is an Therapist, sports and decision to make ems call amde. Husband has not been able to take medications or get around for two days. Urine out put has been good.  Did have to empty his urine bag this am.  Wife state that he was confused for several days.

## 2020-03-31 ENCOUNTER — Encounter (INDEPENDENT_AMBULATORY_CARE_PROVIDER_SITE_OTHER): Payer: Self-pay | Admitting: Ophthalmology

## 2020-03-31 ENCOUNTER — Ambulatory Visit (INDEPENDENT_AMBULATORY_CARE_PROVIDER_SITE_OTHER): Payer: Medicare Other | Admitting: Ophthalmology

## 2020-03-31 ENCOUNTER — Other Ambulatory Visit: Payer: Self-pay

## 2020-03-31 DIAGNOSIS — H43813 Vitreous degeneration, bilateral: Secondary | ICD-10-CM | POA: Diagnosis not present

## 2020-03-31 DIAGNOSIS — H26492 Other secondary cataract, left eye: Secondary | ICD-10-CM | POA: Diagnosis not present

## 2020-03-31 DIAGNOSIS — H353132 Nonexudative age-related macular degeneration, bilateral, intermediate dry stage: Secondary | ICD-10-CM | POA: Diagnosis not present

## 2020-03-31 DIAGNOSIS — H353131 Nonexudative age-related macular degeneration, bilateral, early dry stage: Secondary | ICD-10-CM | POA: Diagnosis not present

## 2020-03-31 NOTE — Progress Notes (Signed)
03/31/2020     CHIEF COMPLAINT Patient presents for Retina Follow Up (6 Month Dry AMD f\u OU. OCT/Pt c/o OS being constantly blurry. Pt states this started soon after last exam. Denies FOL and floaters./BGL: 104/A1C: 6.7)   HISTORY OF PRESENT ILLNESS: Summer Hawkins is a 86 y.o. female who presents to the clinic today for:   HPI    Retina Follow Up    Patient presents with  Dry AMD.  In both eyes.  Severity is moderate.  Duration of 6 months.  Since onset it is stable.  I, the attending physician,  performed the HPI with the patient and updated documentation appropriately. Additional comments: 6 Month Dry AMD f\u OU. OCT Pt c/o OS being constantly blurry. Pt states this started soon after last exam. Denies FOL and floaters. BGL: 104 A1C: 6.7       Last edited by Tilda Franco on 03/31/2020  1:25 PM. (History)      Referring physician: Haywood Pao, MD [address] [place],  [place] [address]  HISTORICAL INFORMATION:   Selected notes from the [facility]    Lab Results  Component Value Date   HGBA1C 6.9 (H) 11/21/2018     CURRENT MEDICATIONS: No current outpatient medications on file. (Ophthalmic Drugs)   No current facility-administered medications for this visit. (Ophthalmic Drugs)   Current Outpatient Medications (Other)  Medication Sig  . anastrozole (ARIMIDEX) 1 MG tablet Take 1 tablet (1 mg total) by mouth daily.  Marland Kitchen apixaban (ELIQUIS) 5 MG TABS tablet Take 1 tablet (5 mg total) by mouth 2 (two) times daily.  . BD PEN NEEDLE NANO U/F 32G X 4 MM MISC 1 application by Other route daily.   . diphenhydramine-acetaminophen (TYLENOL PM) 25-500 MG TABS tablet Take 1 tablet by mouth at bedtime as needed (for sleep).  . ferrous sulfate 325 (65 FE) MG tablet Take 325 mg by mouth daily with breakfast.    . furosemide (LASIX) 20 MG tablet Take 20 mg by mouth as needed.  . irbesartan (AVAPRO) 300 MG tablet Take 300 mg by mouth daily.  Marland Kitchen levothyroxine  (SYNTHROID) 137 MCG tablet Take 137 mcg by mouth daily.  . metFORMIN (GLUCOPHAGE) 850 MG tablet Take 850 mg by mouth 2 (two) times daily with a meal.   . metoprolol succinate (TOPROL XL) 25 MG 24 hr tablet Take 1 tablet (25 mg total) by mouth daily.  . Multiple Vitamins-Minerals (MULTIVITAMIN WITH MINERALS) tablet Take 1 tablet by mouth daily.    . naproxen sodium (ALEVE) 220 MG tablet Take 220 mg by mouth 2 (two) times daily as needed (for pain or headaches).  . ONE TOUCH ULTRA TEST test strip daily.   Nelva Nay SOLOSTAR 300 UNIT/ML SOPN Inject 15 Units into the skin See admin instructions. Inject 15 units into the skin in the morning before breakfast and 10 units at bedtime   No current facility-administered medications for this visit. (Other)      REVIEW OF SYSTEMS: ROS    Positive for: Endocrine   Last edited by Tilda Franco on 03/31/2020  1:25 PM. (History)       ALLERGIES Allergies  Allergen Reactions  . Bee Venom Shortness Of Breath, Nausea And Vomiting and Other (See Comments)    Makes the patient feel faint, also  . Penicillins Anaphylaxis, Hives, Swelling and Other (See Comments)    Has patient had a PCN reaction causing immediate rash, facial/tongue/throat swelling, SOB or lightheadedness with  hypotension: Yes Has patient had a PCN reaction causing severe rash involving mucus membranes or skin necrosis: No Has patient had a PCN reaction that required hospitalization: No Has patient had a PCN reaction occurring within the last 10 years: No If Hawkins of the above answers are "NO", then may proceed with Cephalosporin use.     PAST MEDICAL HISTORY Past Medical History:  Diagnosis Date  . Atrial fibrillation ([place])   . Breast cancer ([place])   . Breast cancer, left ([place])   . Chronic kidney disease    stage III - patient was unaware  . Diabetes mellitus   . Dyspnea   . Dysrhythmia    Afib  . H/O: hysterectomy   . History of colonoscopy 01/25/2010  . History of  mammogram 08/04/2009  . Hyperlipidemia   . Hypertension   . Hypothyroidism   . Ketoacidosis, diabetic, no coma, non-insulin dependent    Type II  . Vitamin B12 deficiency    Past Surgical History:  Procedure Laterality Date  . ABDOMINAL HYSTERECTOMY    . Bilateral foot surgery    . BREAST EXCISIONAL BIOPSY Left 12/2015  . BREAST LUMPECTOMY Left    2017  . BREAST LUMPECTOMY WITH RADIOACTIVE SEED AND SENTINEL LYMPH NODE BIOPSY Left 01/19/2016   Procedure: LEFT BREAST LUMPECTOMY WITH RADIOACTIVE SEED AND SENTINEL LYMPH NODE BIOPSY;  Surgeon: Autumn Messing III, MD;  Location: [facility];  Service: General;  Laterality: Left;  . BREAST LUMPECTOMY WITH RADIOACTIVE SEED LOCALIZATION Left 01/19/2016  . CARDIOVERSION  02/27/2011   Procedure: CARDIOVERSION;  Surgeon: Loralie Champagne, MD;  Location: [facility];  Service: Cardiovascular;  Laterality: N/A;  . LAPAROSCOPIC CHOLECYSTECTOMY    . PACEMAKER IMPLANT N/A 12/02/2018   Procedure: PACEMAKER IMPLANT;  Surgeon: Evans Lance, MD;  Location: [facility];  Service: Cardiovascular;  Laterality: N/A;  . ROTATOR CUFF REPAIR Left   . TUBAL LIGATION      FAMILY HISTORY Family History  Problem Relation Age of Onset  . Heart failure Father 64       enlarged heart  . Pneumonia Mother 57  . Diabetes Mother 90  . Breast cancer Sister   . Cancer Neg Hx     SOCIAL HISTORY Social History   Tobacco Use  . Smoking status: Never Smoker  . Smokeless tobacco: Never Used  Vaping Use  . Vaping Use: Never used  Substance Use Topics  . Alcohol use: No    Alcohol/week: 0.0 standard drinks  . Drug use: No         OPHTHALMIC EXAM:  Base Eye Exam    Visual Acuity (Snellen - Linear)      Right Left   Dist cc 20/25 -2 20/50 -1   Dist ph cc  20/40 -2   Correction: Glasses       Tonometry (Tonopen, 1:30 PM)      Right Left   Pressure 19 19       Pupils      Pupils Dark Light Shape React APD   Right PERRL 4 3 Round Slow None   Left PERRL 4 3  Round Slow None       Visual Fields (Counting fingers)      Left Right    Full Full       Neuro/Psych    Oriented x3: Yes   Mood/Affect: Normal       Dilation    Both eyes: 1.0% Mydriacyl, 2.5% Phenylephrine @ 1:30 PM  Slit Lamp and Fundus Exam    External Exam      Right Left   External Normal Normal       Slit Lamp Exam      Right Left   Lids/Lashes Normal Normal   Conjunctiva/Sclera White and quiet White and quiet   Cornea Clear Clear   Anterior Chamber Deep and quiet Deep and quiet   Iris Round and reactive Round and reactive   Lens Posterior chamber intraocular lens Posterior chamber intraocular lens, 2+ Posterior capsular opacification   Anterior Vitreous Normal Normal       Fundus Exam      Right Left   Posterior Vitreous Posterior vitreous detachment Posterior vitreous detachment   Disc Normal Normal   C/D Ratio 0.7 0.4   Macula Hard drusen, no macular thickening, no hemorrhage, no exudates Hard drusen, no macular thickening, no hemorrhage, no exudates   Vessels Normal, , no DR Normal, , no DR   Periphery Normal Normal          IMAGING AND PROCEDURES  Imaging and Procedures for 03/31/20  OCT, Retina - OU - Both Eyes       Right Eye Quality was good. Scan locations included subfoveal. Central Foveal Thickness: 262. Findings include abnormal foveal contour, retinal drusen , outer retinal atrophy.   Left Eye Quality was good. Scan locations included subfoveal. Central Foveal Thickness: 271. Findings include abnormal foveal contour, retinal drusen , outer retinal atrophy, epiretinal membrane.   Notes No active CNVM OU.  Outer retinal atrophy associated with retinal drusen.  No signs of CNVM.  Minor epiretinal membrane OS nasally, with no retinal thickening                ASSESSMENT/PLAN:  Posterior capsular opacification, left PC opacification is present and accounts for visual symptoms. A YAG PC is recommended.  This procedure  will maximize visual potential and allow enhanced medical monitoring of retinal conditions.  The risk and benefits were explained to the patient, including the risk of an increase in intraocular pressure temporarily after the procedure.   Cataract surgeon was Dr. Juanito Doom.  She may and have now retired.  Of asked the patient return to [facility] for consideration of YAG capsulotomy left eye for PCO opacification  Intermediate stage nonexudative age-related macular degeneration of both eyes The nature of age--related macular degeneration was discussed with the patient as well as the distinction between dry and wet types. Checking an Amsler Grid daily with advice to return immediately should a distortion develop, was given to the patient. The patient 's smoking status now and in the past was determined and advice based on the AREDS study was provided regarding the consumption of antioxidant supplements. AREDS 2 vitamin formulation was recommended. Consumption of dark leafy vegetables and fresh fruits of various colors was recommended. Treatment modalities for wet macular degeneration particularly the use of intravitreal injections of anti-blood vessel growth factors was discussed with the patient. Avastin, Lucentis, and Eylea are the available options. On occasion, therapy includes the use of photodynamic therapy and thermal laser. Stressed to the patient do not rub eyes.  Patient was advised to check Amsler Grid daily and return immediately if changes are noted. Instructions on using the grid were given to the patient. Hawkins patient questions were answered.  Posterior vitreous detachment of both eyes   The nature of posterior vitreous detachment was discussed with the patient as well as its physiology, its age prevalence, and its possible  implication regarding retinal breaks and detachment.  An informational brochure was given to the patient.  Hawkins the patient's questions were answered.   The patient was asked to return if new or different flashes or floaters develops.   Patient was instructed to contact office immediately if any changes were noticed. I explained to the patient that vitreous inside the eye is similar to jello inside a bowl. As the jello melts it can start to pull away from the bowl, similarly the vitreous throughout our lives can begin to pull away from the retina. That process is called a posterior vitreous detachment. In some cases, the vitreous can tug hard enough on the retina to form a retinal tear. I discussed with the patient the signs and symptoms of a retinal detachment.  Do not rub the eye.      ICD-10-CM   1. Early stage nonexudative age-related macular degeneration of both eyes  H35.3131 OCT, Retina - OU - Both Eyes  2. Posterior capsular opacification, left  H26.492   3. Intermediate stage nonexudative age-related macular degeneration of both eyes  H35.3132   4. Posterior vitreous detachment of both eyes  H43.813     1.  OU, no signs of active CNVM will continue to monitor on a 45-month basis.  2.  Patient now has new complaints and visual acuity decline associated with a cloudiness to the vision in the left eye which correlates with posterior capsule opacification in the visual axis left eye  3.  I discussed with the patient the need to return to [facility] consideration of YAG capsulotomy left eye  Ophthalmic Meds Ordered this visit:  No orders of the defined types were placed in this encounter.      Return in about 6 months (around 09/28/2020) for DILATE OU, OCT.  There are no Patient Instructions on file for this visit.   Explained the diagnoses, plan, and follow up with the patient and they expressed understanding.  Patient expressed understanding of the importance of proper follow up care.   Clent Demark Geraldine Tesar M.D. Diseases & Surgery of the Retina and Vitreous [facility] [place] 03/31/20     Abbreviations: M myopia (nearsighted); A astigmatism; H hyperopia (farsighted); P presbyopia; Mrx spectacle prescription;  CTL contact lenses; OD right eye; OS left eye; OU both eyes  XT exotropia; ET esotropia; PEK punctate epithelial keratitis; PEE punctate epithelial erosions; DES dry eye syndrome; MGD meibomian gland dysfunction; ATs artificial tears; PFAT's preservative free artificial tears; [place] nuclear sclerotic cataract; PSC posterior subcapsular cataract; ERM epi-retinal membrane; PVD posterior vitreous detachment; RD retinal detachment; DM diabetes mellitus; DR diabetic retinopathy; NPDR non-proliferative diabetic retinopathy; PDR proliferative diabetic retinopathy; CSME clinically significant macular edema; DME diabetic macular edema; dbh dot blot hemorrhages; CWS cotton wool spot; POAG primary open angle glaucoma; C/D cup-to-disc ratio; HVF humphrey visual field; GVF goldmann visual field; OCT optical coherence tomography; IOP intraocular pressure; BRVO Branch retinal vein occlusion; CRVO central retinal vein occlusion; CRAO central retinal artery occlusion; BRAO branch retinal artery occlusion; RT retinal tear; SB scleral buckle; PPV pars plana vitrectomy; VH Vitreous hemorrhage; PRP panretinal laser photocoagulation; IVK intravitreal kenalog; VMT vitreomacular traction; MH Macular hole;  NVD neovascularization of the disc; NVE neovascularization elsewhere; AREDS age related eye disease study; ARMD age related macular degeneration; POAG primary open angle glaucoma; EBMD epithelial/anterior basement membrane dystrophy; ACIOL anterior chamber intraocular lens; IOL intraocular lens; PCIOL posterior chamber intraocular lens; Phaco/IOL phacoemulsification with intraocular lens placement; PRK photorefractive  keratectomy; LASIK laser assisted in situ keratomileusis; HTN hypertension; DM diabetes mellitus; COPD chronic obstructive pulmonary disease

## 2020-03-31 NOTE — Assessment & Plan Note (Signed)

## 2020-03-31 NOTE — Assessment & Plan Note (Signed)

## 2020-03-31 NOTE — Assessment & Plan Note (Addendum)
PC opacification is present and accounts for visual symptoms. A YAG PC is recommended.  This procedure will maximize visual potential and allow enhanced medical monitoring of retinal conditions.  The risk and benefits were explained to the patient, including the risk of an increase in intraocular pressure temporarily after the procedure.   Cataract surgeon was Dr. Juanito Doom.  She may and have now retired.  Of asked the patient return to [facility] for consideration of YAG capsulotomy left eye for PCO opacification

## 2020-04-01 DIAGNOSIS — E7800 Pure hypercholesterolemia, unspecified: Secondary | ICD-10-CM | POA: Diagnosis not present

## 2020-04-01 DIAGNOSIS — E039 Hypothyroidism, unspecified: Secondary | ICD-10-CM | POA: Diagnosis not present

## 2020-04-01 DIAGNOSIS — E1129 Type 2 diabetes mellitus with other diabetic kidney complication: Secondary | ICD-10-CM | POA: Diagnosis not present

## 2020-04-05 DIAGNOSIS — E1129 Type 2 diabetes mellitus with other diabetic kidney complication: Secondary | ICD-10-CM | POA: Diagnosis not present

## 2020-04-05 DIAGNOSIS — Z1212 Encounter for screening for malignant neoplasm of rectum: Secondary | ICD-10-CM | POA: Diagnosis not present

## 2020-04-05 DIAGNOSIS — R82998 Other abnormal findings in urine: Secondary | ICD-10-CM | POA: Diagnosis not present

## 2020-04-05 DIAGNOSIS — Z6835 Body mass index (BMI) 35.0-35.9, adult: Secondary | ICD-10-CM | POA: Diagnosis not present

## 2020-04-05 DIAGNOSIS — E7800 Pure hypercholesterolemia, unspecified: Secondary | ICD-10-CM | POA: Diagnosis not present

## 2020-04-05 DIAGNOSIS — I4821 Permanent atrial fibrillation: Secondary | ICD-10-CM | POA: Diagnosis not present

## 2020-04-05 DIAGNOSIS — I509 Heart failure, unspecified: Secondary | ICD-10-CM | POA: Diagnosis not present

## 2020-04-05 DIAGNOSIS — E039 Hypothyroidism, unspecified: Secondary | ICD-10-CM | POA: Diagnosis not present

## 2020-04-05 DIAGNOSIS — R296 Repeated falls: Secondary | ICD-10-CM | POA: Diagnosis not present

## 2020-04-05 DIAGNOSIS — Z0000 Encounter for general adult medical examination without abnormal findings: Secondary | ICD-10-CM | POA: Diagnosis not present

## 2020-04-05 DIAGNOSIS — N1832 Chronic kidney disease, stage 3b: Secondary | ICD-10-CM | POA: Diagnosis not present

## 2020-04-05 DIAGNOSIS — Z7901 Long term (current) use of anticoagulants: Secondary | ICD-10-CM | POA: Diagnosis not present

## 2020-04-05 DIAGNOSIS — Z794 Long term (current) use of insulin: Secondary | ICD-10-CM | POA: Diagnosis not present

## 2020-04-05 DIAGNOSIS — I130 Hypertensive heart and chronic kidney disease with heart failure and stage 1 through stage 4 chronic kidney disease, or unspecified chronic kidney disease: Secondary | ICD-10-CM | POA: Diagnosis not present

## 2020-04-27 DIAGNOSIS — R296 Repeated falls: Secondary | ICD-10-CM | POA: Diagnosis not present

## 2020-05-04 DIAGNOSIS — R296 Repeated falls: Secondary | ICD-10-CM | POA: Diagnosis not present

## 2020-05-07 ENCOUNTER — Other Ambulatory Visit: Payer: Self-pay

## 2020-05-07 ENCOUNTER — Ambulatory Visit (INDEPENDENT_AMBULATORY_CARE_PROVIDER_SITE_OTHER): Payer: Medicare Other | Admitting: Cardiovascular Disease

## 2020-05-07 ENCOUNTER — Encounter: Payer: Self-pay | Admitting: Cardiovascular Disease

## 2020-05-07 VITALS — BP 146/80 | HR 87 | Ht 64.5 in | Wt 215.2 lb

## 2020-05-07 DIAGNOSIS — R296 Repeated falls: Secondary | ICD-10-CM | POA: Diagnosis not present

## 2020-05-07 DIAGNOSIS — I5032 Chronic diastolic (congestive) heart failure: Secondary | ICD-10-CM

## 2020-05-07 DIAGNOSIS — I4821 Permanent atrial fibrillation: Secondary | ICD-10-CM | POA: Diagnosis not present

## 2020-05-07 DIAGNOSIS — I340 Nonrheumatic mitral (valve) insufficiency: Secondary | ICD-10-CM | POA: Diagnosis not present

## 2020-05-07 MED ORDER — METOPROLOL SUCCINATE ER 25 MG PO TB24: 25.0000 mg | ORAL_TABLET | Freq: Every day | ORAL | 3 refills | Status: DC

## 2020-05-07 NOTE — Progress Notes (Signed)
Chief Complaint  Patient presents with  . Follow-up    Atrial fibrillation    History of Present Illness: 86 yo female with with history of persistent atrial fibrillation, bradycardia, DM, hypothyroidism, HTN and hyperlipidemia today for cardiac followup. She was found to have atrial fibrillation in 2012. She had no awareness of palpitations. No stress induced ischemia on nuclear study. Low risk study. Normal LVEF. I started Toprol XL and Pradaxa. She has since been changed to Eliquis. Echo on 01/12/11 with normal LV size and function, LVEF of 55-60%, mild LVH, moderate TR, mild MR. She was cardioverted on 02/27/11. She was seen in primary care in May 2019 and  dyspnea and weakness. HR was in the 40s on 06/22/17 with atrial fib. Her Toprol dose was reduced to 25 mg daily. She was felt to be volume overloaded and was started on Lasix 20 mg daily. I saw her in our office 07/13/17 and her heart rate was improved. Her symptoms had resolved. 48 hour cardiac monitor May 2019 showed atrial fib with lowest heart rate 57 bpm, PVCs. Echo May 2019 with LvEF=55-60%, no wall motion abnormalities. No significant valve disease. She was admitted to [facility] July 16-18, 2019 with symptomatic bradycardia and decompensated CHF. Her heart rate was in the 40s. Her beta blocker was stopped and her heart rate improved. She was diuresed with IV Lasix. Norvasc added due to uncontrolled HTN. She was seen in EP clinic November 2019 by Dr. Lennie Odor and he did not feel that a pacemaker was indicated. Event monitor November 2019 with no high grade AV block. She was seen in the ED 11/15/18 after a syncopal event and was found to be in atrial fibrillation. She was orthostatic at office follow up 11/21/18. She was readmitted with syncope and bradycardia 11/30/18 and had a pacemaker placed. No recurrent syncope following pacemaker placement.   She is here today for follow up. The patient denies any chest pain, dyspnea, palpitations, lower extremity  edema, orthopnea, PND, dizziness, near syncope or syncope.   Primary Care Physician: Haywood Pao, MD  Past Medical History:  Diagnosis Date  . Atrial fibrillation ([place])   . Breast cancer ([place])   . Breast cancer, left ([place])   . Chronic kidney disease    stage III - patient was unaware  . Diabetes mellitus   . Dyspnea   . Dysrhythmia    Afib  . H/O: hysterectomy   . History of colonoscopy 01/25/2010  . History of mammogram 08/04/2009  . Hyperlipidemia   . Hypertension   . Hypothyroidism   . Ketoacidosis, diabetic, no coma, non-insulin dependent    Type II  . Vitamin B12 deficiency     Past Surgical History:  Procedure Laterality Date  . ABDOMINAL HYSTERECTOMY    . Bilateral foot surgery    . BREAST EXCISIONAL BIOPSY Left 12/2015  . BREAST LUMPECTOMY Left    2017  . BREAST LUMPECTOMY WITH RADIOACTIVE SEED AND SENTINEL LYMPH NODE BIOPSY Left 01/19/2016   Procedure: LEFT BREAST LUMPECTOMY WITH RADIOACTIVE SEED AND SENTINEL LYMPH NODE BIOPSY;  Surgeon: Autumn Messing III, MD;  Location: [facility];  Service: General;  Laterality: Left;  . BREAST LUMPECTOMY WITH RADIOACTIVE SEED LOCALIZATION Left 01/19/2016  . CARDIOVERSION  02/27/2011   Procedure: CARDIOVERSION;  Surgeon: Loralie Champagne, MD;  Location: [facility];  Service: Cardiovascular;  Laterality: N/A;  . LAPAROSCOPIC CHOLECYSTECTOMY    . PACEMAKER IMPLANT N/A 12/02/2018   Procedure: PACEMAKER IMPLANT;  Surgeon: Evans Lance, MD;  Location: [facility];  Service: Cardiovascular;  Laterality: N/A;  . ROTATOR CUFF REPAIR Left   . TUBAL LIGATION      Current Outpatient Medications  Medication Sig Dispense Refill  . anastrozole (ARIMIDEX) 1 MG tablet Take 1 tablet (1 mg total) by mouth daily. 90 tablet 3  . apixaban (ELIQUIS) 5 MG TABS tablet Take 1 tablet (5 mg total) by mouth 2 (two) times daily. 60 tablet   . BD PEN NEEDLE NANO U/F 32G X 4 MM MISC 1 application by Other route daily.     . diphenhydramine-acetaminophen  (TYLENOL PM) 25-500 MG TABS tablet Take 1 tablet by mouth at bedtime as needed (for sleep).    . ferrous sulfate 325 (65 FE) MG tablet Take 325 mg by mouth daily with breakfast.    . furosemide (LASIX) 20 MG tablet Take 20 mg by mouth as needed.    . irbesartan (AVAPRO) 300 MG tablet Take 300 mg by mouth daily.    Marland Kitchen levothyroxine (SYNTHROID) 137 MCG tablet Take 137 mcg by mouth daily.    . metFORMIN (GLUCOPHAGE) 850 MG tablet Take 850 mg by mouth 2 (two) times daily with a meal.    . Multiple Vitamins-Minerals (MULTIVITAMIN WITH MINERALS) tablet Take 1 tablet by mouth daily.    . naproxen sodium (ALEVE) 220 MG tablet Take 220 mg by mouth 2 (two) times daily as needed (for pain or headaches).    . ONE TOUCH ULTRA TEST test strip daily.     Nelva Nay SOLOSTAR 300 UNIT/ML SOPN Inject 15 Units into the skin See admin instructions. Inject 15 units into the skin in the morning before breakfast and 10 units at bedtime  1  . metoprolol succinate (TOPROL XL) 25 MG 24 hr tablet Take 1 tablet (25 mg total) by mouth daily. 90 tablet 3   No current facility-administered medications for this visit.    Allergies  Allergen Reactions  . Bee Venom Shortness Of Breath, Nausea And Vomiting and Other (See Comments)    Makes the patient feel faint, also  . Penicillins Anaphylaxis, Hives, Swelling and Other (See Comments)    Has patient had a PCN reaction causing immediate rash, facial/tongue/throat swelling, SOB or lightheadedness with hypotension: Yes Has patient had a PCN reaction causing severe rash involving mucus membranes or skin necrosis: No Has patient had a PCN reaction that required hospitalization: No Has patient had a PCN reaction occurring within the last 10 years: No If all of the above answers are "NO", then may proceed with Cephalosporin use.     Social History   Socioeconomic History  . Marital status: Married    Spouse name: Not on file  . Number of children: 2  . Years of education: Not  on file  . Highest education level: Not on file  Occupational History    Employer: OTHER    Comment: Worked at [employer]  . Smoking status: Never Smoker  . Smokeless tobacco: Never Used  Vaping Use  . Vaping Use: Never used  Substance and Sexual Activity  . Alcohol use: No    Alcohol/week: 0.0 standard drinks  . Drug use: No  . Sexual activity: Not on file  Other Topics Concern  . Not on file  Social History Narrative   Patient since 72   Husband with prostate cancer   10-siblings-no cancer   Social Determinants of Health   Financial Resource Strain: Not on file  Food Insecurity: Not on  file  Transportation Needs: Not on file  Physical Activity: Not on file  Stress: Not on file  Social Connections: Not on file  Intimate Partner Violence: Not on file    Family History  Problem Relation Age of Onset  . Heart failure Father 35       enlarged heart  . Pneumonia Mother 78  . Diabetes Mother 55  . Breast cancer Sister   . Cancer Neg Hx     Review of Systems:  As stated in the HPI and otherwise negative.   BP (!) 146/80   Pulse 87   Ht 5' 4.5" (1.638 m)   Wt 215 lb 3.2 oz (97.6 kg)   LMP  (LMP Unknown)   SpO2 96%   BMI 36.37 kg/m   Physical Examination:  General: Well developed, well nourished, NAD  HEENT: OP clear, mucus membranes moist  SKIN: warm, dry. No rashes. Neuro: No focal deficits  Musculoskeletal: Muscle strength 5/5 all ext  Psychiatric: Mood and affect normal  Neck: No JVD, no carotid bruits, no thyromegaly, no lymphadenopathy.  Lungs:Clear bilaterally, no wheezes, rhonci, crackles Cardiovascular: Regular rate and rhythm. No murmurs, gallops or rubs. Abdomen:Soft. Bowel sounds present. Non-tender.  Extremities: No lower extremity edema. Pulses are 2 + in the bilateral DP/PT.  Echo May 2019: - Left ventricle: The cavity size was normal. Wall thickness was   increased in a pattern of mild LVH. Systolic function was normal.   The  estimated ejection fraction was in the range of 55% to 60%.   Wall motion was normal; there were no regional wall motion   abnormalities. - Mitral valve: Mildly to moderately calcified annulus. Mildly   thickened leaflets . - Left atrium: The atrium was mildly dilated. - Pulmonary arteries: Systolic pressure was mildly increased. PA   peak pressure: 36 mm Hg (S).  EKG:  EKG is ordered today. The ekg ordered today demonstrates atrial fibrillation  Recent Labs: No results found for requested labs within last 8760 hours.   Lipid Panel No results found for: CHOL, TRIG, HDL, CHOLHDL, VLDL, LDLCALC, LDLDIRECT   Wt Readings from Last 3 Encounters:  05/07/20 215 lb 3.2 oz (97.6 kg)  01/06/20 211 lb 14.4 oz (96.1 kg)  03/24/19 221 lb 12.8 oz (100.6 kg)     Other studies Reviewed: Additional studies/ records that were reviewed today include: . Review of the above records demonstrates:    Assessment and Plan:   1. Atrial fibrillation, permanent/Bradycardia: She is in atrial fibrillation today. Rate is controlled. Permanent pacemaker is in place. Will continue Toprol and Eliquis.     2. HTN: BP is controlled. No changes  3. Mitral valve insufficiency: Trivial by echo May 2019. No loud murmur on exam today  4. Chronic diastolic CHF: Weight is stable. No volume overload on exam. Lasix prn.    Current medicines are reviewed at length with the patient today.  The patient does not have concerns regarding medicines.  The following changes have been made:  no change  Labs/ tests ordered today include:   Orders Placed This Encounter  Procedures  . EKG 12-Lead   Signed, Lauree Chandler, MD 05/07/2020 4:30 PM    [facility] [place] [address], [place], [place]  [address] Phone: [phone]; Fax: [phone]

## 2020-05-07 NOTE — Patient Instructions (Signed)

## 2020-05-14 DIAGNOSIS — R296 Repeated falls: Secondary | ICD-10-CM | POA: Diagnosis not present

## 2020-05-19 DIAGNOSIS — E113293 Type 2 diabetes mellitus with mild nonproliferative diabetic retinopathy without macular edema, bilateral: Secondary | ICD-10-CM | POA: Diagnosis not present

## 2020-05-19 DIAGNOSIS — H353132 Nonexudative age-related macular degeneration, bilateral, intermediate dry stage: Secondary | ICD-10-CM | POA: Diagnosis not present

## 2020-05-19 DIAGNOSIS — H401134 Primary open-angle glaucoma, bilateral, indeterminate stage: Secondary | ICD-10-CM | POA: Diagnosis not present

## 2020-05-19 DIAGNOSIS — H52203 Unspecified astigmatism, bilateral: Secondary | ICD-10-CM | POA: Diagnosis not present

## 2020-05-25 DIAGNOSIS — H26492 Other secondary cataract, left eye: Secondary | ICD-10-CM | POA: Diagnosis not present

## 2020-05-28 DIAGNOSIS — R296 Repeated falls: Secondary | ICD-10-CM | POA: Diagnosis not present

## 2020-05-31 ENCOUNTER — Ambulatory Visit (INDEPENDENT_AMBULATORY_CARE_PROVIDER_SITE_OTHER): Payer: Medicare Other

## 2020-05-31 DIAGNOSIS — I5032 Chronic diastolic (congestive) heart failure: Secondary | ICD-10-CM | POA: Diagnosis not present

## 2020-05-31 LAB — CUP PACEART REMOTE DEVICE CHECK
Battery Remaining Longevity: 123 mo
Battery Voltage: 3.01 V
Brady Statistic AP VP Percent: 0.39 %
Brady Statistic AP VS Percent: 80.17 %
Brady Statistic AS VP Percent: 0 %
Brady Statistic AS VS Percent: 19.43 %
Brady Statistic RA Percent Paced: 80.21 %
Brady Statistic RV Percent Paced: 0.39 %
Date Time Interrogation Session: 20220321075700
Implantable Lead Implant Date: 20200921
Implantable Lead Implant Date: 20200921
Implantable Lead Location: 753858
Implantable Lead Location: 753860
Implantable Lead Model: 3830
Implantable Lead Model: 5076
Implantable Pulse Generator Implant Date: 20200921
Lead Channel Impedance Value: 323 Ohm
Lead Channel Impedance Value: 380 Ohm
Lead Channel Impedance Value: 418 Ohm
Lead Channel Impedance Value: 494 Ohm
Lead Channel Sensing Intrinsic Amplitude: 10.875 mV
Lead Channel Sensing Intrinsic Amplitude: 10.875 mV
Lead Channel Sensing Intrinsic Amplitude: 8.375 mV
Lead Channel Sensing Intrinsic Amplitude: 8.375 mV
Lead Channel Setting Pacing Amplitude: 2.5 V
Lead Channel Setting Pacing Amplitude: 2.5 V
Lead Channel Setting Pacing Pulse Width: 0.4 ms
Lead Channel Setting Sensing Sensitivity: 1.2 mV

## 2020-06-01 DIAGNOSIS — R296 Repeated falls: Secondary | ICD-10-CM | POA: Diagnosis not present

## 2020-06-03 DIAGNOSIS — R296 Repeated falls: Secondary | ICD-10-CM | POA: Diagnosis not present

## 2020-06-08 NOTE — Progress Notes (Signed)
Remote pacemaker transmission.   

## 2020-06-10 DIAGNOSIS — R296 Repeated falls: Secondary | ICD-10-CM | POA: Diagnosis not present

## 2020-06-14 DIAGNOSIS — R296 Repeated falls: Secondary | ICD-10-CM | POA: Diagnosis not present

## 2020-06-22 DIAGNOSIS — R296 Repeated falls: Secondary | ICD-10-CM | POA: Diagnosis not present

## 2020-07-09 DIAGNOSIS — D0471 Carcinoma in situ of skin of right lower limb, including hip: Secondary | ICD-10-CM | POA: Diagnosis not present

## 2020-07-09 DIAGNOSIS — L308 Other specified dermatitis: Secondary | ICD-10-CM | POA: Diagnosis not present

## 2020-07-09 DIAGNOSIS — L658 Other specified nonscarring hair loss: Secondary | ICD-10-CM | POA: Diagnosis not present

## 2020-07-09 DIAGNOSIS — C44722 Squamous cell carcinoma of skin of right lower limb, including hip: Secondary | ICD-10-CM | POA: Diagnosis not present

## 2020-07-12 ENCOUNTER — Telehealth: Payer: Self-pay | Admitting: Cardiovascular Disease

## 2020-07-12 NOTE — Telephone Encounter (Signed)
Attempted phone call to pt.  Mailbox is full.  Unable to leave voicemail message.

## 2020-07-12 NOTE — Telephone Encounter (Signed)
Spoke with pt and advised per our [place] pt ok to start therapy which will also help with her BP.  Would recommend speaking with dermatologist about checking BMET after starting medication.  Pt verbalizes understanding and thanked Therapist, sports for the phone call.

## 2020-07-12 NOTE — Telephone Encounter (Signed)
Spoke with pt who states her Dermatologist would like her to start Spironolactone for hair growth.  Pt would like to know if this is safe with other medications.  Pt advised will forward to pharmacy team as well as Dr. Angelena Form for review.  Pt verbalizes understanding and agrees with current plan.

## 2020-07-12 NOTE — Telephone Encounter (Signed)
Pt c/o medication issue:  1. Name of Medication:  Spironolactone   2. How are you currently taking this medication (dosage and times per day)?  Patient does not take this medication 3. Are you having a reaction (difficulty breathing--STAT)? No   4. What is your medication issue?  Patient states her dermatologist is recommending this medication and she would like to know if Dr. Angelena Form agrees with this. She would like to know if this will interact with any of her other medications. Please advise.

## 2020-07-12 NOTE — Telephone Encounter (Signed)
Agree. We can start aldactone 25 mg daily. Thanks, Gerald Stabs

## 2020-07-12 NOTE — Telephone Encounter (Signed)
No drug interactions noted with her spironolactone. SCr was 1.4 on 03/26/19 which is stable for pt, K 4.6 on 04/01/20. She has a history of HTN which has remained above goal on recent visits, so spironolactone will help with her BP as well. She's fine to start therapy - would recommend rechecking BMET ~2 weeks after initiation.

## 2020-07-14 NOTE — Telephone Encounter (Signed)
Spoke with pt and dermatologist has written Rx for Spironolactone.  Pt plans to start medication tomorrow.

## 2020-08-10 ENCOUNTER — Telehealth: Payer: Self-pay | Admitting: *Deleted

## 2020-08-10 NOTE — Telephone Encounter (Signed)
[id number]/tct-[place] about some back pain issues she is having, not constant but off and on.  Describes the pain has sharp and going from her lower back into the anterior thigh area.  Has taking aleve for the pain with some success.  No know injury or incidence that might have caused  the pain. Has not tried using ice.  Did suggest that she use it careful and not directly on the skin and for 20 minutes on and twenty minutes off.  If that does not ease the pain or it gets worse she is to call her md for further instructions.

## 2020-08-30 ENCOUNTER — Ambulatory Visit (INDEPENDENT_AMBULATORY_CARE_PROVIDER_SITE_OTHER): Payer: Medicare Other

## 2020-08-30 DIAGNOSIS — I4821 Permanent atrial fibrillation: Secondary | ICD-10-CM | POA: Diagnosis not present

## 2020-08-31 DIAGNOSIS — M5442 Lumbago with sciatica, left side: Secondary | ICD-10-CM | POA: Diagnosis not present

## 2020-08-31 LAB — CUP PACEART REMOTE DEVICE CHECK
Battery Remaining Longevity: 121 mo
Battery Voltage: 3.01 V
Brady Statistic AP VP Percent: 0.16 %
Brady Statistic AP VS Percent: 99.78 %
Brady Statistic AS VP Percent: 0 %
Brady Statistic AS VS Percent: 0.06 %
Brady Statistic RA Percent Paced: 99.95 %
Brady Statistic RV Percent Paced: 0.16 %
Date Time Interrogation Session: 20220619204031
Implantable Lead Implant Date: 20200921
Implantable Lead Implant Date: 20200921
Implantable Lead Location: 753858
Implantable Lead Location: 753860
Implantable Lead Model: 3830
Implantable Lead Model: 5076
Implantable Pulse Generator Implant Date: 20200921
Lead Channel Impedance Value: 342 Ohm
Lead Channel Impedance Value: 418 Ohm
Lead Channel Impedance Value: 418 Ohm
Lead Channel Impedance Value: 513 Ohm
Lead Channel Sensing Intrinsic Amplitude: 10.875 mV
Lead Channel Sensing Intrinsic Amplitude: 10.875 mV
Lead Channel Sensing Intrinsic Amplitude: 8.375 mV
Lead Channel Sensing Intrinsic Amplitude: 8.375 mV
Lead Channel Setting Pacing Amplitude: 2.5 V
Lead Channel Setting Pacing Amplitude: 2.5 V
Lead Channel Setting Pacing Pulse Width: 0.4 ms
Lead Channel Setting Sensing Sensitivity: 1.2 mV

## 2020-09-06 ENCOUNTER — Telehealth: Payer: Self-pay | Admitting: Internal Medicine

## 2020-09-06 NOTE — Telephone Encounter (Signed)
Becky from Dr. Clayton Bibles office calling to requesting more information be faxed to them about the patient's pacemaker for an MRI. She states they need to know if the pacemaker and leeds are MRI compatible and need information so they can have a rep present for the MRI. Fax: [phone]. Phone: [phone]

## 2020-09-06 NOTE — Telephone Encounter (Signed)
Medtronic MRI form was faxed on 09/02/20.  Refaxed as requested today and included # for Medtr[employer] to be contacted.

## 2020-09-07 ENCOUNTER — Telehealth: Payer: Self-pay | Admitting: *Deleted

## 2020-09-07 NOTE — Telephone Encounter (Signed)
Tct- Analys having back issues, needs to have an mri done to [place] if surgery is needed,  has a pacemaker so the techincian has to be there.  May have to have laminectomy on l4-l5.  Husband [name] ia notr doing well mentaly and has to have someone with him.  There is no family near by.Summer Hawkins will need hhc post op. For several weeks or until she can get around in the house.  Did discuss respite care for [name] while she is ij the hospital or having someone from an agency come and stay with him in shifts. Stated that a church member has offered to come and be with the husband during the hospitalization.  She will call back when she knows if surg. Is needed.

## 2020-09-08 ENCOUNTER — Other Ambulatory Visit: Payer: Self-pay | Admitting: Neurosurgery

## 2020-09-08 ENCOUNTER — Other Ambulatory Visit (HOSPITAL_COMMUNITY): Payer: Self-pay | Admitting: Neurosurgery

## 2020-09-08 DIAGNOSIS — M4316 Spondylolisthesis, lumbar region: Secondary | ICD-10-CM

## 2020-09-15 NOTE — Progress Notes (Signed)
Electrophysiology Office Note Date: 09/16/2020  ID:  Summer Hawkins, DOB February 08, 1935, MRN [id number]  PCP: Haywood Pao, MD Primary Cardiologist: Lauree Chandler, MD Electrophysiologist: Cristopher Peru, MD   CC: Pacemaker follow-up  Summer Hawkins is a 86 y.o. female seen today for Cristopher Peru, MD for routine electrophysiology followup.  Since last being seen in our clinic the patient reports doing well overall.  Unfortunately, her husband is over at [facility] with what sounds like a terminal illness.  she denies chest pain, palpitations, dyspnea, PND, orthopnea, nausea, vomiting, dizziness, syncope, edema, weight gain, or early satiety.  Device History: MDT dual chamber PPM with HIS and RV lead, implanted 12/02/2018, Dr. Lovena Le  Past Medical History:  Diagnosis Date   Atrial fibrillation ([place])    Breast cancer ([place])    Breast cancer, left ([place])    Chronic kidney disease    stage III - patient was unaware   Diabetes mellitus    Dyspnea    Dysrhythmia    Afib   H/O: hysterectomy    History of colonoscopy 01/25/2010   History of mammogram 08/04/2009   Hyperlipidemia    Hypertension    Hypothyroidism    Ketoacidosis, diabetic, no coma, non-insulin dependent    Type II   Vitamin B12 deficiency    Past Surgical History:  Procedure Laterality Date   ABDOMINAL HYSTERECTOMY     Bilateral foot surgery     BREAST EXCISIONAL BIOPSY Left 12/2015   BREAST LUMPECTOMY Left    2017   BREAST LUMPECTOMY WITH RADIOACTIVE SEED AND SENTINEL LYMPH NODE BIOPSY Left 01/19/2016   Procedure: LEFT BREAST LUMPECTOMY WITH RADIOACTIVE SEED AND SENTINEL LYMPH NODE BIOPSY;  Surgeon: Autumn Messing III, MD;  Location: [facility];  Service: General;  Laterality: Left;   BREAST LUMPECTOMY WITH RADIOACTIVE SEED LOCALIZATION Left 01/19/2016   CARDIOVERSION  02/27/2011   Procedure: CARDIOVERSION;  Surgeon: Loralie Champagne, MD;  Location: [facility];  Service: Cardiovascular;  Laterality: N/A;   LAPAROSCOPIC  CHOLECYSTECTOMY     PACEMAKER IMPLANT N/A 12/02/2018   Procedure: PACEMAKER IMPLANT;  Surgeon: Evans Lance, MD;  Location: [facility];  Service: Cardiovascular;  Laterality: N/A;   ROTATOR CUFF REPAIR Left    TUBAL LIGATION      Current Outpatient Medications  Medication Sig Dispense Refill   amLODipine (NORVASC) 5 MG tablet Take 5 mg by mouth daily.     anastrozole (ARIMIDEX) 1 MG tablet Take 1 tablet (1 mg total) by mouth daily. 90 tablet 3   apixaban (ELIQUIS) 5 MG TABS tablet Take 1 tablet (5 mg total) by mouth 2 (two) times daily. 60 tablet    augmented betamethasone dipropionate (DIPROLENE-AF) 0.05 % cream Apply topically.     BD PEN NEEDLE NANO U/F 32G X 4 MM MISC 1 application by Other route daily.      diphenhydramine-acetaminophen (TYLENOL PM) 25-500 MG TABS tablet Take 1 tablet by mouth at bedtime as needed (for sleep).     ferrous sulfate 325 (65 FE) MG tablet Take 325 mg by mouth daily with breakfast.     furosemide (LASIX) 20 MG tablet Take 20 mg by mouth as needed.     irbesartan (AVAPRO) 300 MG tablet Take 300 mg by mouth daily.     levothyroxine (SYNTHROID) 137 MCG tablet Take 137 mcg by mouth daily.     metFORMIN (GLUCOPHAGE) 850 MG tablet Take 850 mg by mouth 2 (two) times daily with a meal.  metoprolol succinate (TOPROL XL) 25 MG 24 hr tablet Take 1 tablet (25 mg total) by mouth daily. 90 tablet 3   Multiple Vitamins-Minerals (MULTIVITAMIN WITH MINERALS) tablet Take 1 tablet by mouth daily.     naproxen sodium (ALEVE) 220 MG tablet Take 220 mg by mouth 2 (two) times daily as needed (for pain or headaches).     ONE TOUCH ULTRA TEST test strip daily.      spironolactone (ALDACTONE) 25 MG tablet Take 25 mg by mouth daily. Prescribed by Derm for hair growth per pt.     timolol (TIMOPTIC) 0.5 % ophthalmic solution 1 drop every morning.     TOUJEO SOLOSTAR 300 UNIT/ML SOPN Inject 15 Units into the skin See admin instructions. Inject 15 units into the skin in the  morning before breakfast and 10 units at bedtime  1   traMADol (ULTRAM) 50 MG tablet Take 50 mg by mouth daily.     No current facility-administered medications for this visit.    Allergies:   Bee venom and Penicillins   Social History: Social History   Socioeconomic History   Marital status: Married    Spouse name: Not on file   Number of children: 2   Years of education: Not on file   Highest education level: Not on file  Occupational History    Employer: OTHER    Comment: Worked at [employer] Use   Smoking status: Never   Smokeless tobacco: Never  Vaping Use   Vaping Use: Never used  Substance and Sexual Activity   Alcohol use: No    Alcohol/week: 0.0 standard drinks   Drug use: No   Sexual activity: Not on file  Other Topics Concern   Not on file  Social History Narrative   Patient since 49   Husband with prostate cancer   10-siblings-no cancer   Social Determinants of Health   Financial Resource Strain: Not on file  Food Insecurity: Not on file  Transportation Needs: Not on file  Physical Activity: Not on file  Stress: Not on file  Social Connections: Not on file  Intimate Partner Violence: Not on file    Family History: Family History  Problem Relation Age of Onset   Heart failure Father 5       enlarged heart   Pneumonia Mother 43   Diabetes Mother 37   Breast cancer Sister    Cancer Neg Hx      Review of Systems: All other systems reviewed and are otherwise negative except as noted above.  Physical Exam: Vitals:   09/16/20 1104  BP: 114/68  Pulse: 73  SpO2: 97%  Weight: 208 lb 12.8 oz (94.7 kg)  Height: 5' 4.5" (1.638 m)     GEN- The patient is well appearing, alert and oriented x 3 today.   HEENT: normocephalic, atraumatic; sclera clear, conjunctiva pink; hearing intact; oropharynx clear; neck supple  Lungs- Clear to ausculation bilaterally, normal work of breathing.  No wheezes, rales, rhonchi Heart- Regular rate and  rhythm, no murmurs, rubs or gallops  GI- soft, non-tender, non-distended, bowel sounds present  Extremities- no clubbing or cyanosis. No edema MS- no significant deformity or atrophy Skin- warm and dry, no rash or lesion; PPM pocket well healed Psych- euthymic mood, full affect Neuro- strength and sensation are intact  PPM Interrogation- reviewed in detail today,  See PACEART report  EKG:  EKG is not ordered today. The ekg ordered 04/2020 shows atrial fibrillation at 87 bpm  Recent Labs: No results found for requested labs within last 8760 hours.   Wt Readings from Last 3 Encounters:  09/16/20 208 lb 12.8 oz (94.7 kg)  05/07/20 215 lb 3.2 oz (97.6 kg)  01/06/20 211 lb 14.4 oz (96.1 kg)     Other studies Reviewed: Additional studies/ records that were reviewed today include: Previous EP office notes, Previous remote checks, Most recent labwork.   Assessment and Plan:  1. Symptomatic bradycardia s/p Medtronic PPM  Normal PPM function See Pace Art report No changes today  2. Permanent AF Continue eliquis for CHA2DS2-VASc of at least 4  3. HTN Stable on current medications  4. Chronic diastolic CHF Volume status stable on exam NYHA II-III symptoms on lasix and spiro  5. Cardiac clearance She is pending MRI to see if she needs back surgery.  Would most likely be low/mod risk from cardiac perspective pending updated labwork (requesting most recent from PCP)  Current medicines are reviewed at length with the patient today.   The patient does not have concerns regarding her medicines.  The following changes were made today:  none  Labs/ tests ordered today include:  No orders of the defined types were placed in this encounter.  Disposition:   Follow up with Dr. Alecia Lemming APP in 12 Months. Will requires most recent PCP labs.  Jacalyn Lefevre, PA-C  09/16/2020 1:26 PM  [address] [place] [address] [phone]  (office) [phone] (fax)

## 2020-09-16 ENCOUNTER — Encounter: Payer: Self-pay | Admitting: Student

## 2020-09-16 ENCOUNTER — Other Ambulatory Visit: Payer: Self-pay

## 2020-09-16 ENCOUNTER — Ambulatory Visit (INDEPENDENT_AMBULATORY_CARE_PROVIDER_SITE_OTHER): Payer: Medicare Other | Admitting: Student

## 2020-09-16 VITALS — BP 114/68 | HR 73 | Ht 64.5 in | Wt 208.8 lb

## 2020-09-16 DIAGNOSIS — I4821 Permanent atrial fibrillation: Secondary | ICD-10-CM | POA: Diagnosis not present

## 2020-09-16 DIAGNOSIS — I5032 Chronic diastolic (congestive) heart failure: Secondary | ICD-10-CM | POA: Diagnosis not present

## 2020-09-16 DIAGNOSIS — I482 Chronic atrial fibrillation, unspecified: Secondary | ICD-10-CM

## 2020-09-16 DIAGNOSIS — R001 Bradycardia, unspecified: Secondary | ICD-10-CM

## 2020-09-16 DIAGNOSIS — Z01810 Encounter for preprocedural cardiovascular examination: Secondary | ICD-10-CM

## 2020-09-16 LAB — CUP PACEART INCLINIC DEVICE CHECK
Battery Remaining Longevity: 123 mo
Battery Voltage: 3.01 V
Brady Statistic AP VP Percent: 0.18 %
Brady Statistic AP VS Percent: 96.53 %
Brady Statistic AS VP Percent: 0 %
Brady Statistic AS VS Percent: 3.28 %
Brady Statistic RA Percent Paced: 96.66 %
Brady Statistic RV Percent Paced: 0.18 %
Date Time Interrogation Session: 20220707132704
Implantable Lead Implant Date: 20200921
Implantable Lead Implant Date: 20200921
Implantable Lead Location: 753858
Implantable Lead Location: 753860
Implantable Lead Model: 3830
Implantable Lead Model: 5076
Implantable Pulse Generator Implant Date: 20200921
Lead Channel Impedance Value: 361 Ohm
Lead Channel Impedance Value: 437 Ohm
Lead Channel Impedance Value: 437 Ohm
Lead Channel Impedance Value: 551 Ohm
Lead Channel Sensing Intrinsic Amplitude: 10.875 mV
Lead Channel Sensing Intrinsic Amplitude: 10.875 mV
Lead Channel Sensing Intrinsic Amplitude: 8.375 mV
Lead Channel Sensing Intrinsic Amplitude: 8.375 mV
Lead Channel Setting Pacing Amplitude: 2.5 V
Lead Channel Setting Pacing Amplitude: 2.5 V
Lead Channel Setting Pacing Pulse Width: 0.4 ms
Lead Channel Setting Sensing Sensitivity: 1.2 mV

## 2020-09-16 NOTE — Patient Instructions (Signed)
Medication Instructions:  Your physician recommends that you continue on your current medications as directed. Please refer to the Current Medication list given to you today.  *If you need a refill on your cardiac medications before your next appointment, please call your pharmacy*   Lab Work: None If you have labs (blood work) drawn today and your tests are completely normal, you will receive your results only by: [place] (if you have MyChart) OR A paper copy in the mail If you have any lab test that is abnormal or we need to change your treatment, we will call you to review the results.  Follow-Up: At [facility], you and your health needs are our priority.  As part of our continuing mission to provide you with exceptional heart care, we have created designated Provider Care Teams.  These Care Teams include your primary Cardiologist (physician) and Advanced Practice Providers (APPs -  Physician Assistants and Nurse Practitioners) who all work together to provide you with the care you need, when you need it.  We recommend signing up for the patient portal called "MyChart".  Sign up information is provided on this After Visit Summary.  MyChart is used to connect with patients for Virtual Visits (Telemedicine).  Patients are able to view lab/test results, encounter notes, upcoming appointments, etc.  Non-urgent messages can be sent to your provider as well.   To learn more about what you can do with MyChart, go to [email or web address].    Your next appointment:   1 year(s)  The format for your next appointment:   In Person  Provider:   You may see Cristopher Peru, MD or one of the following Advanced Practice Providers on your designated Care Team:    [name], [place] "[facility]" [place], [place]

## 2020-09-17 NOTE — Progress Notes (Signed)
Remote pacemaker transmission.   

## 2020-09-30 ENCOUNTER — Encounter (INDEPENDENT_AMBULATORY_CARE_PROVIDER_SITE_OTHER): Payer: Medicare Other | Admitting: Ophthalmology

## 2020-10-05 ENCOUNTER — Ambulatory Visit (HOSPITAL_COMMUNITY)
Admission: RE | Admit: 2020-10-05 | Discharge: 2020-10-05 | Disposition: A | Discharge status: Home / Self Care | Payer: Medicare Other | Source: Ambulatory Visit | Attending: Neurosurgery | Admitting: Neurosurgery

## 2020-10-05 ENCOUNTER — Other Ambulatory Visit: Payer: Self-pay

## 2020-10-05 DIAGNOSIS — M4316 Spondylolisthesis, lumbar region: Secondary | ICD-10-CM | POA: Insufficient documentation

## 2020-10-05 DIAGNOSIS — M5127 Other intervertebral disc displacement, lumbosacral region: Secondary | ICD-10-CM | POA: Diagnosis not present

## 2020-10-05 DIAGNOSIS — M5126 Other intervertebral disc displacement, lumbar region: Secondary | ICD-10-CM | POA: Diagnosis not present

## 2020-10-05 DIAGNOSIS — M48061 Spinal stenosis, lumbar region without neurogenic claudication: Secondary | ICD-10-CM | POA: Diagnosis not present

## 2020-10-05 DIAGNOSIS — M47816 Spondylosis without myelopathy or radiculopathy, lumbar region: Secondary | ICD-10-CM | POA: Diagnosis not present

## 2020-10-05 NOTE — Progress Notes (Signed)
Per order, Changed device settings for MRI to  VOO at 85 bpm  Will program device back to pre-MRI settings after completion of exam, and send transmission.

## 2020-10-06 DIAGNOSIS — M545 Low back pain, unspecified: Secondary | ICD-10-CM | POA: Diagnosis not present

## 2020-10-06 DIAGNOSIS — Z7901 Long term (current) use of anticoagulants: Secondary | ICD-10-CM | POA: Diagnosis not present

## 2020-10-06 DIAGNOSIS — E113599 Type 2 diabetes mellitus with proliferative diabetic retinopathy without macular edema, unspecified eye: Secondary | ICD-10-CM | POA: Diagnosis not present

## 2020-10-06 DIAGNOSIS — I4821 Permanent atrial fibrillation: Secondary | ICD-10-CM | POA: Diagnosis not present

## 2020-10-06 DIAGNOSIS — E7800 Pure hypercholesterolemia, unspecified: Secondary | ICD-10-CM | POA: Diagnosis not present

## 2020-10-06 DIAGNOSIS — E1129 Type 2 diabetes mellitus with other diabetic kidney complication: Secondary | ICD-10-CM | POA: Diagnosis not present

## 2020-10-06 DIAGNOSIS — N1832 Chronic kidney disease, stage 3b: Secondary | ICD-10-CM | POA: Diagnosis not present

## 2020-10-06 DIAGNOSIS — Z794 Long term (current) use of insulin: Secondary | ICD-10-CM | POA: Diagnosis not present

## 2020-10-06 DIAGNOSIS — I130 Hypertensive heart and chronic kidney disease with heart failure and stage 1 through stage 4 chronic kidney disease, or unspecified chronic kidney disease: Secondary | ICD-10-CM | POA: Diagnosis not present

## 2020-10-06 DIAGNOSIS — E039 Hypothyroidism, unspecified: Secondary | ICD-10-CM | POA: Diagnosis not present

## 2020-10-06 DIAGNOSIS — I509 Heart failure, unspecified: Secondary | ICD-10-CM | POA: Diagnosis not present

## 2020-10-06 DIAGNOSIS — C50412 Malignant neoplasm of upper-outer quadrant of left female breast: Secondary | ICD-10-CM | POA: Diagnosis not present

## 2020-10-11 DIAGNOSIS — Z794 Long term (current) use of insulin: Secondary | ICD-10-CM | POA: Diagnosis not present

## 2020-10-11 DIAGNOSIS — N3281 Overactive bladder: Secondary | ICD-10-CM | POA: Diagnosis not present

## 2020-10-11 DIAGNOSIS — N1832 Chronic kidney disease, stage 3b: Secondary | ICD-10-CM | POA: Diagnosis not present

## 2020-10-11 DIAGNOSIS — Z7901 Long term (current) use of anticoagulants: Secondary | ICD-10-CM | POA: Diagnosis not present

## 2020-10-11 DIAGNOSIS — D849 Immunodeficiency, unspecified: Secondary | ICD-10-CM | POA: Diagnosis not present

## 2020-10-11 DIAGNOSIS — H409 Unspecified glaucoma: Secondary | ICD-10-CM | POA: Diagnosis not present

## 2020-10-11 DIAGNOSIS — E538 Deficiency of other specified B group vitamins: Secondary | ICD-10-CM | POA: Diagnosis not present

## 2020-10-11 DIAGNOSIS — R32 Unspecified urinary incontinence: Secondary | ICD-10-CM | POA: Diagnosis not present

## 2020-10-11 DIAGNOSIS — I509 Heart failure, unspecified: Secondary | ICD-10-CM | POA: Diagnosis not present

## 2020-10-11 DIAGNOSIS — Z6835 Body mass index (BMI) 35.0-35.9, adult: Secondary | ICD-10-CM | POA: Diagnosis not present

## 2020-10-11 DIAGNOSIS — C50412 Malignant neoplasm of upper-outer quadrant of left female breast: Secondary | ICD-10-CM | POA: Diagnosis not present

## 2020-10-11 DIAGNOSIS — I130 Hypertensive heart and chronic kidney disease with heart failure and stage 1 through stage 4 chronic kidney disease, or unspecified chronic kidney disease: Secondary | ICD-10-CM | POA: Diagnosis not present

## 2020-10-11 DIAGNOSIS — G8929 Other chronic pain: Secondary | ICD-10-CM | POA: Diagnosis not present

## 2020-10-11 DIAGNOSIS — G5602 Carpal tunnel syndrome, left upper limb: Secondary | ICD-10-CM | POA: Diagnosis not present

## 2020-10-11 DIAGNOSIS — E113599 Type 2 diabetes mellitus with proliferative diabetic retinopathy without macular edema, unspecified eye: Secondary | ICD-10-CM | POA: Diagnosis not present

## 2020-10-11 DIAGNOSIS — E11319 Type 2 diabetes mellitus with unspecified diabetic retinopathy without macular edema: Secondary | ICD-10-CM | POA: Diagnosis not present

## 2020-10-11 DIAGNOSIS — E039 Hypothyroidism, unspecified: Secondary | ICD-10-CM | POA: Diagnosis not present

## 2020-10-11 DIAGNOSIS — E1122 Type 2 diabetes mellitus with diabetic chronic kidney disease: Secondary | ICD-10-CM | POA: Diagnosis not present

## 2020-10-11 DIAGNOSIS — M47816 Spondylosis without myelopathy or radiculopathy, lumbar region: Secondary | ICD-10-CM | POA: Diagnosis not present

## 2020-10-11 DIAGNOSIS — H3530 Unspecified macular degeneration: Secondary | ICD-10-CM | POA: Diagnosis not present

## 2020-10-11 DIAGNOSIS — E7800 Pure hypercholesterolemia, unspecified: Secondary | ICD-10-CM | POA: Diagnosis not present

## 2020-10-11 DIAGNOSIS — E611 Iron deficiency: Secondary | ICD-10-CM | POA: Diagnosis not present

## 2020-10-11 DIAGNOSIS — I4821 Permanent atrial fibrillation: Secondary | ICD-10-CM | POA: Diagnosis not present

## 2020-10-11 DIAGNOSIS — D631 Anemia in chronic kidney disease: Secondary | ICD-10-CM | POA: Diagnosis not present

## 2020-10-15 DIAGNOSIS — M25552 Pain in left hip: Secondary | ICD-10-CM | POA: Diagnosis not present

## 2020-10-20 ENCOUNTER — Other Ambulatory Visit (HOSPITAL_COMMUNITY): Payer: Self-pay | Admitting: Orthopedic Surgery

## 2020-10-20 DIAGNOSIS — M1612 Unilateral primary osteoarthritis, left hip: Secondary | ICD-10-CM

## 2020-10-20 DIAGNOSIS — M25552 Pain in left hip: Secondary | ICD-10-CM | POA: Diagnosis not present

## 2020-10-21 ENCOUNTER — Ambulatory Visit (HOSPITAL_COMMUNITY)
Admission: RE | Admit: 2020-10-21 | Discharge: 2020-10-21 | Disposition: A | Discharge status: Home / Self Care | Payer: Medicare Other | Source: Ambulatory Visit | Attending: Orthopedic Surgery | Admitting: Orthopedic Surgery

## 2020-10-21 DIAGNOSIS — M1612 Unilateral primary osteoarthritis, left hip: Secondary | ICD-10-CM | POA: Diagnosis not present

## 2020-10-21 DIAGNOSIS — M25452 Effusion, left hip: Secondary | ICD-10-CM | POA: Diagnosis not present

## 2020-10-21 DIAGNOSIS — M76892 Other specified enthesopathies of left lower limb, excluding foot: Secondary | ICD-10-CM | POA: Diagnosis not present

## 2020-10-21 DIAGNOSIS — R600 Localized edema: Secondary | ICD-10-CM | POA: Diagnosis not present

## 2020-10-21 NOTE — Progress Notes (Signed)
Patient here today at [facility] for MRI right hip wo contrast. Patient has medtronic device. Carelink express sent to mike-rep and renee-cards PA. Orders received for VOO 80

## 2020-10-22 DIAGNOSIS — N1832 Chronic kidney disease, stage 3b: Secondary | ICD-10-CM | POA: Diagnosis not present

## 2020-10-22 DIAGNOSIS — G8929 Other chronic pain: Secondary | ICD-10-CM | POA: Diagnosis not present

## 2020-10-22 DIAGNOSIS — I130 Hypertensive heart and chronic kidney disease with heart failure and stage 1 through stage 4 chronic kidney disease, or unspecified chronic kidney disease: Secondary | ICD-10-CM | POA: Diagnosis not present

## 2020-10-22 DIAGNOSIS — D631 Anemia in chronic kidney disease: Secondary | ICD-10-CM | POA: Diagnosis not present

## 2020-10-22 DIAGNOSIS — E1122 Type 2 diabetes mellitus with diabetic chronic kidney disease: Secondary | ICD-10-CM | POA: Diagnosis not present

## 2020-10-22 DIAGNOSIS — I509 Heart failure, unspecified: Secondary | ICD-10-CM | POA: Diagnosis not present

## 2020-10-26 DIAGNOSIS — I10 Essential (primary) hypertension: Secondary | ICD-10-CM | POA: Diagnosis not present

## 2020-10-26 DIAGNOSIS — M4316 Spondylolisthesis, lumbar region: Secondary | ICD-10-CM | POA: Diagnosis not present

## 2020-10-26 DIAGNOSIS — Z6834 Body mass index (BMI) 34.0-34.9, adult: Secondary | ICD-10-CM | POA: Diagnosis not present

## 2020-10-27 ENCOUNTER — Telehealth: Payer: Self-pay | Admitting: *Deleted

## 2020-10-27 ENCOUNTER — Encounter: Payer: Self-pay | Admitting: *Deleted

## 2020-10-27 ENCOUNTER — Other Ambulatory Visit: Payer: Self-pay | Admitting: Neurosurgery

## 2020-10-27 DIAGNOSIS — D631 Anemia in chronic kidney disease: Secondary | ICD-10-CM | POA: Diagnosis not present

## 2020-10-27 DIAGNOSIS — G8929 Other chronic pain: Secondary | ICD-10-CM | POA: Diagnosis not present

## 2020-10-27 DIAGNOSIS — I130 Hypertensive heart and chronic kidney disease with heart failure and stage 1 through stage 4 chronic kidney disease, or unspecified chronic kidney disease: Secondary | ICD-10-CM | POA: Diagnosis not present

## 2020-10-27 DIAGNOSIS — N1832 Chronic kidney disease, stage 3b: Secondary | ICD-10-CM | POA: Diagnosis not present

## 2020-10-27 DIAGNOSIS — I509 Heart failure, unspecified: Secondary | ICD-10-CM | POA: Diagnosis not present

## 2020-10-27 DIAGNOSIS — E1122 Type 2 diabetes mellitus with diabetic chronic kidney disease: Secondary | ICD-10-CM | POA: Diagnosis not present

## 2020-10-27 NOTE — Care Management Note (Unsigned)
Summer Hawkins, was visited by a hhc rn at home and was told that she is unsafe to be at home byherself at this time due to back pain and weakness. Reports that she fell on 081522.  Saw a suregon on [phone] about her mri and back pain and will need surgery in September. Recently lost her husband.  Informed her to call her pcp and explain the situation to them and to ask for replacement in a snf for physical therapy until surgery can be done.  She is unable to walk without a walker and is in excruciating pain when getting up or ambulating and left leg gives out.  Did inform her that if the md office can not help to come to the ed for management for back pain with leg weakness. She is to call the pcp first.

## 2020-10-27 NOTE — Progress Notes (Unsigned)
Subjective:    Summer Hawkins is a 86 y.o. female who presents for evaluation of low back pain. The patient has had {history; pain back:5285::"recurrent self limited episodes of low back pain in the past"}{}. Symptoms have been present for {1-10:13787}{} {units:19031} and are {clinical course - history:17}.  Onset was related to / precipitated by {causes; back pain:32249::"no known injury"}{}. The pain is located in the {back pain location:31199} and {radiation:20410}{}. The pain is described as {pain quality:31200} and occurs {timing:31009}{}. {Pain rating:20411} Symptoms are exacerbated by {causes; aggravators pain back:31424}{}. Symptoms are improved by {pain treatments:32172}{}. She has also tried {pain treatments:32172}{} which provided no symptom relief. She has {back pain associated symptoms neuro:31426}{} associated with the back pain. {red flag Hx:20412}  {Common ambulatory SmartLinks:19316}  Review of Systems {ros - complete:30496}{}    Objective:   {Exam; back exam:5796::"Full range of motion without pain, no tenderness, no spasm, no curvature.","Normal reflexes, gait, strength and negative straight-leg raise."}{}    Assessment:    {back diagnosis:16452}{}    Plan:    {Plan; back pain:10213}{}

## 2020-10-27 NOTE — Congregational Nurse Program (Unsigned)
See previous note dated [phone]

## 2020-10-27 NOTE — Telephone Encounter (Signed)
   [facility] Pre-operative Risk Assessment    Patient Name: Summer Hawkins  DOB: May 16, 1934 MRN: [id number]  HEARTCARE STAFF:  - IMPORTANT!!!!!! Under Visit Info/Reason for Call, type in Other and utilize the format Clearance MM/DD/YY or Clearance TBD. Do not use dashes or single digits. - Please review there is not already an duplicate clearance open for this procedure. - If request is for dental extraction, please clarify the # of teeth to be extracted. - If the patient is currently at the dentist's office, call Pre-Op Callback Staff (MA/nurse) to input urgent request.  - If the patient is not currently in the dentist office, please route to the Pre-Op pool.  Request for surgical clearance:  What type of surgery is being performed?  LUMBAR FUSION  When is this surgery scheduled?  TBD  What type of clearance is required (medical clearance vs. Pharmacy clearance to hold med vs. Both)?   BOTH  Are there any medications that need to be held prior to surgery and how long?  [place] name and name of physician performing surgery?  [facility]   What is the office phone number?  [phone]   7.   What is the office fax number?   [phone]  8.   Anesthesia type (None, local, MAC, general) ?  GENERAL   Jeanann Lewandowsky 10/27/2020, 11:02 AM  _________________________________________________________________   (provider comments below)

## 2020-10-28 ENCOUNTER — Other Ambulatory Visit: Payer: Self-pay

## 2020-10-28 ENCOUNTER — Emergency Department (HOSPITAL_COMMUNITY): Payer: Medicare Other

## 2020-10-28 ENCOUNTER — Telehealth: Payer: Self-pay | Admitting: *Deleted

## 2020-10-28 ENCOUNTER — Emergency Department (HOSPITAL_COMMUNITY)
Admission: EM | Admit: 2020-10-28 | Discharge: 2020-11-01 | Disposition: A | Discharge status: Home / Self Care | Payer: Medicare Other | Attending: Emergency Medicine | Admitting: Emergency Medicine

## 2020-10-28 DIAGNOSIS — M545 Low back pain, unspecified: Secondary | ICD-10-CM | POA: Diagnosis present

## 2020-10-28 DIAGNOSIS — I130 Hypertensive heart and chronic kidney disease with heart failure and stage 1 through stage 4 chronic kidney disease, or unspecified chronic kidney disease: Secondary | ICD-10-CM | POA: Diagnosis not present

## 2020-10-28 DIAGNOSIS — E1122 Type 2 diabetes mellitus with diabetic chronic kidney disease: Secondary | ICD-10-CM | POA: Insufficient documentation

## 2020-10-28 DIAGNOSIS — Z043 Encounter for examination and observation following other accident: Secondary | ICD-10-CM | POA: Diagnosis not present

## 2020-10-28 DIAGNOSIS — Z7901 Long term (current) use of anticoagulants: Secondary | ICD-10-CM | POA: Diagnosis not present

## 2020-10-28 DIAGNOSIS — Z853 Personal history of malignant neoplasm of breast: Secondary | ICD-10-CM | POA: Insufficient documentation

## 2020-10-28 DIAGNOSIS — Z7984 Long term (current) use of oral hypoglycemic drugs: Secondary | ICD-10-CM | POA: Diagnosis not present

## 2020-10-28 DIAGNOSIS — E039 Hypothyroidism, unspecified: Secondary | ICD-10-CM | POA: Diagnosis not present

## 2020-10-28 DIAGNOSIS — I10 Essential (primary) hypertension: Secondary | ICD-10-CM | POA: Diagnosis not present

## 2020-10-28 DIAGNOSIS — M5442 Lumbago with sciatica, left side: Secondary | ICD-10-CM | POA: Insufficient documentation

## 2020-10-28 DIAGNOSIS — Z950 Presence of cardiac pacemaker: Secondary | ICD-10-CM | POA: Diagnosis not present

## 2020-10-28 DIAGNOSIS — N183 Chronic kidney disease, stage 3 unspecified: Secondary | ICD-10-CM | POA: Insufficient documentation

## 2020-10-28 DIAGNOSIS — Z79899 Other long term (current) drug therapy: Secondary | ICD-10-CM | POA: Insufficient documentation

## 2020-10-28 DIAGNOSIS — G8929 Other chronic pain: Secondary | ICD-10-CM | POA: Insufficient documentation

## 2020-10-28 DIAGNOSIS — M1612 Unilateral primary osteoarthritis, left hip: Secondary | ICD-10-CM | POA: Diagnosis not present

## 2020-10-28 DIAGNOSIS — Z20822 Contact with and (suspected) exposure to covid-19: Secondary | ICD-10-CM | POA: Insufficient documentation

## 2020-10-28 DIAGNOSIS — M549 Dorsalgia, unspecified: Secondary | ICD-10-CM | POA: Diagnosis not present

## 2020-10-28 DIAGNOSIS — I5032 Chronic diastolic (congestive) heart failure: Secondary | ICD-10-CM | POA: Diagnosis not present

## 2020-10-28 DIAGNOSIS — Z794 Long term (current) use of insulin: Secondary | ICD-10-CM | POA: Insufficient documentation

## 2020-10-28 LAB — CBC WITH DIFFERENTIAL/PLATELET
Abs Immature Granulocytes: 0.06 10*3/uL (ref 0.00–0.07)
Basophils Absolute: 0.1 10*3/uL (ref 0.0–0.1)
Basophils Relative: 0 %
Eosinophils Absolute: 0.3 10*3/uL (ref 0.0–0.5)
Eosinophils Relative: 2 %
HCT: 41.8 % (ref 36.0–46.0)
Hemoglobin: 13.5 g/dL (ref 12.0–15.0)
Immature Granulocytes: 0 %
Lymphocytes Relative: 20 %
Lymphs Abs: 2.8 10*3/uL (ref 0.7–4.0)
MCH: 31.5 pg (ref 26.0–34.0)
MCHC: 32.3 g/dL (ref 30.0–36.0)
MCV: 97.7 fL (ref 80.0–100.0)
Monocytes Absolute: 1.3 10*3/uL — ABNORMAL HIGH (ref 0.1–1.0)
Monocytes Relative: 9 %
Neutro Abs: 9.5 10*3/uL — ABNORMAL HIGH (ref 1.7–7.7)
Neutrophils Relative %: 69 %
Platelets: 352 10*3/uL (ref 150–400)
RBC: 4.28 MIL/uL (ref 3.87–5.11)
RDW: 13.6 % (ref 11.5–15.5)
WBC: 14 10*3/uL — ABNORMAL HIGH (ref 4.0–10.5)
nRBC: 0 % (ref 0.0–0.2)

## 2020-10-28 LAB — COMPREHENSIVE METABOLIC PANEL
ALT: 15 U/L (ref 0–44)
AST: 12 U/L — ABNORMAL LOW (ref 15–41)
Albumin: 3.4 g/dL — ABNORMAL LOW (ref 3.5–5.0)
Alkaline Phosphatase: 58 U/L (ref 38–126)
Anion gap: 10 (ref 5–15)
BUN: 42 mg/dL — ABNORMAL HIGH (ref 8–23)
CO2: 19 mmol/L — ABNORMAL LOW (ref 22–32)
Calcium: 9.4 mg/dL (ref 8.9–10.3)
Chloride: 104 mmol/L (ref 98–111)
Creatinine, Ser: 1.99 mg/dL — ABNORMAL HIGH (ref 0.44–1.00)
GFR, Estimated: 24 mL/min — ABNORMAL LOW (ref >60)
Glucose, Bld: 132 mg/dL — ABNORMAL HIGH (ref 70–99)
Potassium: 3.9 mmol/L (ref 3.5–5.1)
Sodium: 133 mmol/L — ABNORMAL LOW (ref 135–145)
Total Bilirubin: 0.9 mg/dL (ref 0.3–1.2)
Total Protein: 6.4 g/dL — ABNORMAL LOW (ref 6.5–8.1)

## 2020-10-28 MED ORDER — ONDANSETRON HCL 4 MG/2ML IJ SOLN
4.0000 mg | Freq: Once | INTRAMUSCULAR | Status: AC
  Administered 2020-10-28: 4 mg via INTRAVENOUS
  Filled 2020-10-28: qty 2

## 2020-10-28 MED ORDER — FENTANYL CITRATE PF 50 MCG/ML IJ SOSY
50.0000 ug | PREFILLED_SYRINGE | Freq: Once | INTRAMUSCULAR | Status: AC
  Administered 2020-10-28: 50 ug via INTRAVENOUS
  Filled 2020-10-28: qty 1

## 2020-10-28 NOTE — ED Triage Notes (Signed)
Pt with chronic back and hip pain from home. Having increasing difficulty walking and standing and her doctor told her to come to the ER because she does not need to be living alone anymore. Scheduled for back surgery 9/7.

## 2020-10-28 NOTE — Care Management (Signed)
ED RN Care Manager  received call from [facility] concerning patient. Patient apparently has had recurrent falls several days, patient lives alone. She is also scheduled for back surgery 9/7 according to Congregational Nurse

## 2020-10-28 NOTE — ED Notes (Signed)
Pt given food. Sat up in bed. Denies further needs.

## 2020-10-28 NOTE — ED Notes (Signed)
Pt says pain has improved. Resting in bed comfortably. Denies further needs.

## 2020-10-28 NOTE — Telephone Encounter (Signed)
[id number]/1000/tcf-Summer Hawkins remains in pain and unable to walk/did call pcp on [phone] and was told that she is unsafe to be by herself and that she needed to be treated and placed.  Did not give her any other instructions.  Was told this am to call ems and go to [place] where her md is and be seen for severe pain and inability to walk.  Stated that she would.

## 2020-10-28 NOTE — ED Provider Notes (Addendum)
[facility] EMERGENCY DEPARTMENT Provider Note   CSN: [id number] Arrival date & time: 10/28/20  1343     History Chief Complaint  Patient presents with   Back Pain    Summer Hawkins is a 86 y.o. female.   Back Pain Associated symptoms: no abdominal pain, no chest pain, no dysuria and no fever    86 year old female with a past medical history of hypertension, hyperlipidemia, breast cancer, atrial fibrillation on Eliquis presenting to the emergency department with progressively worsening lower back pain and left hip pain.  Patient reports that she has been following by neurosurgery and orthopedic surgery for severe spinal stenosis resulting in sciatica, as well as severe left hip arthritis.  She states that she is scheduled for surgery on 9/7 for her back and then is going to get a hip replacement after that.  She states that her husband died 24 month ago and she has been living alone since that time.  Her pain has been progressively worsening, constant, radiating down her left leg.  It is not improved with tramadol at home.  She states that she is now currently unable to walk because her pain is so bad has been laying in bed.  She states that her primary care doctor told her to come to the hospitalist that she can be placed in a rehabilitation facility.  She states that someone from her [facility] was attempting to help her get placement, but the pain is so severe and she is unable to care for herself so she had to come here before they could find somewhere.  She denies any saddle anesthesia.  She denies any bowel or bladder issues.  She denies any recent falls or new trauma to her back.  Past Medical History:  Diagnosis Date   Atrial fibrillation ([place])    Breast cancer ([place])    Breast cancer, left ([place])    Chronic kidney disease    stage III - patient was unaware   Diabetes mellitus    Dyspnea    Dysrhythmia    Afib   H/O: hysterectomy    History of colonoscopy  01/25/2010   History of mammogram 08/04/2009   Hyperlipidemia    Hypertension    Hypothyroidism    Ketoacidosis, diabetic, no coma, non-insulin dependent    Type II   Vitamin B12 deficiency     Patient Active Problem List   Diagnosis Date Noted   Posterior capsular opacification, left 03/31/2020   Posterior vitreous detachment of both eyes 03/31/2020   Intermediate stage nonexudative age-related macular degeneration of both eyes 09/25/2019   Moderate nonproliferative diabetic retinopathy of both eyes ([place]) 09/25/2019   Pacemaker 03/20/2019   Symptomatic bradycardia 11/30/2018   Syncope and collapse 11/21/2018   Bradycardia 09/25/2017   Permanent atrial fibrillation (HCC)    Chronic diastolic CHF (congestive heart failure) ([place]) 08/08/2017   Essential hypertension 08/08/2017   Breast cancer of upper-outer quadrant of left female breast ([place]) 01/05/2016   Chronic atrial fibrillation ([place]) 01/05/2011    Past Surgical History:  Procedure Laterality Date   ABDOMINAL HYSTERECTOMY     Bilateral foot surgery     BREAST EXCISIONAL BIOPSY Left 12/2015   BREAST LUMPECTOMY Left    2017   BREAST LUMPECTOMY WITH RADIOACTIVE SEED AND SENTINEL LYMPH NODE BIOPSY Left 01/19/2016   Procedure: LEFT BREAST LUMPECTOMY WITH RADIOACTIVE SEED AND SENTINEL LYMPH NODE BIOPSY;  Surgeon: Autumn Messing III, MD;  Location: [facility];  Service: General;  Laterality:  Left;   BREAST LUMPECTOMY WITH RADIOACTIVE SEED LOCALIZATION Left 01/19/2016   CARDIOVERSION  02/27/2011   Procedure: CARDIOVERSION;  Surgeon: Loralie Champagne, MD;  Location: [facility];  Service: Cardiovascular;  Laterality: N/A;   LAPAROSCOPIC CHOLECYSTECTOMY     PACEMAKER IMPLANT N/A 12/02/2018   Procedure: PACEMAKER IMPLANT;  Surgeon: Evans Lance, MD;  Location: [facility];  Service: Cardiovascular;  Laterality: N/A;   ROTATOR CUFF REPAIR Left    TUBAL LIGATION       OB History   No obstetric history on file.     Family History   Problem Relation Age of Onset   Heart failure Father 76       enlarged heart   Pneumonia Mother 66   Diabetes Mother 40   Breast cancer Sister    Cancer Neg Hx     Social History   Tobacco Use   Smoking status: Never   Smokeless tobacco: Never  Vaping Use   Vaping Use: Never used  Substance Use Topics   Alcohol use: No    Alcohol/week: 0.0 standard drinks   Drug use: No    Home Medications Prior to Admission medications   Medication Sig Start Date End Date Taking? Authorizing Provider  amLODipine (NORVASC) 5 MG tablet Take 5 mg by mouth daily. 04/29/20  Yes [provider]  anastrozole (ARIMIDEX) 1 MG tablet Take 1 tablet (1 mg total) by mouth daily. 01/06/20  Yes Nicholas Lose, MD  apixaban (ELIQUIS) 5 MG TABS tablet Take 1 tablet (5 mg total) by mouth 2 (two) times daily. 11/16/16  Yes Nicholas Lose, MD  BD PEN NEEDLE NANO U/F 32G X 4 MM MISC 1 application by Other route daily.  03/27/18  Yes [provider]  diphenhydramine-acetaminophen (TYLENOL PM) 25-500 MG TABS tablet Take 1 tablet by mouth at bedtime as needed (for sleep).   Yes [provider]  ferrous sulfate 325 (65 FE) MG tablet Take 325 mg by mouth daily with breakfast.   Yes [provider]  irbesartan (AVAPRO) 300 MG tablet Take 300 mg by mouth daily. 02/11/19  Yes [provider]  levothyroxine (SYNTHROID) 137 MCG tablet Take 137 mcg by mouth daily. 07/02/18  Yes [provider]  metFORMIN (GLUCOPHAGE) 850 MG tablet Take 850 mg by mouth 2 (two) times daily with a meal.   Yes [provider]  metoprolol succinate (TOPROL XL) 25 MG 24 hr tablet Take 1 tablet (25 mg total) by mouth daily. 05/07/20 05/07/21 Yes Burnell Blanks, MD  Multiple Vitamins-Minerals (MULTIVITAMIN WITH MINERALS) tablet Take 1 tablet by mouth daily.   Yes [provider]  ONE TOUCH ULTRA TEST test strip daily. Every 2-3 days 04/03/18  Yes [provider]   spironolactone (ALDACTONE) 25 MG tablet Take 25 mg by mouth daily. Prescribed by Derm for hair growth per pt.   Yes [provider]  timolol (BETIMOL) 0.5 % ophthalmic solution Place 1 drop into both eyes daily.   Yes [provider]  timolol (TIMOPTIC) 0.5 % ophthalmic solution 1 drop every morning. 05/22/20  Yes [provider]  TOUJEO SOLOSTAR 300 UNIT/ML SOPN Inject 15 Units into the skin See admin instructions. Inject 15 units into the skin in the morning before breakfast and 10 units at bedtime 03/12/15  Yes [provider]  traMADol (ULTRAM) 50 MG tablet Take 50 mg by mouth daily.   Yes [provider]  LAGEVRIO 200 MG CAPS Take 200 mg by mouth  See admin instructions. 4 caps every 12 hours x 5 days Patient not taking: Reported on 10/28/2020 09/20/20   [provider]  MEDROL 4 MG TBPK tablet Take 4 mg by mouth as directed. 6,5,4,3,2,1 Patient not taking: Reported on 10/28/2020 10/20/20   [provider]    Allergies    Bee venom, Penicillins, Liraglutide, Lisinopril, and Zocor [simvastatin]  Review of Systems   Review of Systems  Constitutional:  Negative for chills and fever.  HENT:  Negative for ear pain and sore throat.   Eyes:  Negative for pain and visual disturbance.  Respiratory:  Negative for cough and shortness of breath.   Cardiovascular:  Negative for chest pain and palpitations.  Gastrointestinal:  Negative for abdominal pain and vomiting.  Genitourinary:  Negative for dysuria and hematuria.  Musculoskeletal:  Positive for back pain. Negative for arthralgias.  Skin:  Negative for color change and rash.  Neurological:  Negative for seizures and syncope.  All other systems reviewed and are negative.  Physical Exam Updated Vital Signs BP 109/88   Pulse 95   Temp 98 F (36.7 C) (Oral)   Resp 18   LMP  (LMP Unknown)   SpO2 98%   Physical Exam Vitals and nursing note reviewed.  Constitutional:       General: She is not in acute distress.    Appearance: Normal appearance. She is well-developed and normal weight. She is not ill-appearing or toxic-appearing.  HENT:     Head: Normocephalic and atraumatic.  Eyes:     Conjunctiva/sclera: Conjunctivae normal.  Cardiovascular:     Rate and Rhythm: Normal rate and regular rhythm.     Heart sounds: No murmur heard. Pulmonary:     Effort: Pulmonary effort is normal. No respiratory distress.     Breath sounds: Normal breath sounds.  Abdominal:     Palpations: Abdomen is soft.     Tenderness: There is no abdominal tenderness. There is no guarding.     Hernia: No hernia is present.  Musculoskeletal:        General: Tenderness present.     Cervical back: Neck supple.     Comments: Significant tenderness to palpation in the midline lumbar spine as well as just off the midline to the right.  Any active or passive movement of the bilateral lower extremities causes severe pain with reproduction of sciatica down the left side.  However, patient has 5 out of 5 strength in dorsiflexion and plantarflexion.  It also appears that she has 5 out of 5 strength in her knee flexors and extensors as well as hip flexors and extensors, but it causes her great pain to range these joints.  Skin:    General: Skin is warm and dry.  Neurological:     Mental Status: She is alert and oriented to person, place, and time.    ED Results / Procedures / Treatments   Labs (all labs ordered are listed, but only abnormal results are displayed) Labs Reviewed  CBC WITH DIFFERENTIAL/PLATELET - Abnormal; Notable for the following components:      Result Value   WBC 14.0 (*)    Neutro Abs 9.5 (*)    Monocytes Absolute 1.3 (*)    All other components within normal limits  COMPREHENSIVE METABOLIC PANEL - Abnormal; Notable for the following components:   Sodium 133 (*)    CO2 19 (*)    Glucose, Bld 132 (*)    BUN 42 (*)    Creatinine, Ser  1.99 (*)    Total Protein 6.4 (*)     Albumin 3.4 (*)    AST 12 (*)    GFR, Estimated 24 (*)    All other components within normal limits  URINALYSIS, ROUTINE W REFLEX MICROSCOPIC - Abnormal; Notable for the following components:   APPearance HAZY (*)    Leukocytes,Ua SMALL (*)    Bacteria, UA MANY (*)    All other components within normal limits  SARS CORONAVIRUS 2 (TAT 6-24 HRS)    EKG None  Radiology DG Lumbar Spine Complete  Result Date: 10/28/2020 CLINICAL DATA:  Fall EXAM: LUMBAR SPINE - COMPLETE 4+ VIEW COMPARISON:  Lumbar spine MRI 10/05/2020 FINDINGS: There are 5 non-rib-bearing lumbar type vertebral bodies. Vertebral body heights are preserved. Alignment is normal. There is no evidence of spondylolysis. Facet arthropathy is most advanced at L4-L5 and L5-S1. There is calcified atherosclerotic plaque of the aortic arch. Cholecystectomy clips are noted. Cardiac device leads are partially imaged. IMPRESSION: No acute findings. Electronically Signed   By: Valetta Mole M.D.   On: 10/28/2020 15:30   DG Hip Unilat W or Wo Pelvis 2-3 Views Left  Result Date: 10/28/2020 CLINICAL DATA:  Fall EXAM: DG HIP (WITH OR WITHOUT PELVIS) 2-3V LEFT COMPARISON:  Hip radiographs 08/31/2020, hip MRI 10/21/2020 FINDINGS: There is no acute fracture or dislocation. Femoroacetabular alignment is normal bilaterally. There are severe degenerative changes in the left hip, stable. There are more mild degenerative changes of the right hip. The SI joints and symphysis pubis are intact. The soft tissues are unremarkable. IMPRESSION: 1. No acute fracture or dislocation. 2. Severe degenerative changes of the left hip, stable Electronically Signed   By: Valetta Mole M.D.   On: 10/28/2020 15:28    Procedures Procedures   Medications Ordered in ED Medications  amLODipine (NORVASC) tablet 5 mg (has no administration in time range)  apixaban (ELIQUIS) tablet 5 mg (5 mg Oral Given 10/29/20 0117)  irbesartan (AVAPRO) tablet 300 mg (has no administration  in time range)  metFORMIN (GLUCOPHAGE) tablet 850 mg (has no administration in time range)  metoprolol succinate (TOPROL-XL) 24 hr tablet 25 mg (has no administration in time range)  spironolactone (ALDACTONE) tablet 25 mg (has no administration in time range)  levothyroxine (SYNTHROID) tablet 137 mcg (137 mcg Oral Given 10/29/20 0550)  insulin glargine-yfgn (SEMGLEE) injection 15 Units (15 Units Subcutaneous Not Given 10/29/20 0105)  ondansetron (ZOFRAN) injection 4 mg (4 mg Intravenous Given 10/28/20 2120)  fentaNYL (SUBLIMAZE) injection 50 mcg (50 mcg Intravenous Given 10/28/20 2120)    ED Course  I have reviewed the triage vital signs and the nursing notes.  Pertinent labs & imaging results that were available during my care of the patient were reviewed by me and considered in my medical decision making (see chart for details).    MDM Rules/Calculators/A&P                           86 year old female with above past medical history presenting to the emergency department with progressive worsening of her chronic back pain.  She is scheduled for surgery on 9/7.  Vital signs reviewed on arrival, within acceptable limits.  Her husband passed away 1 month ago and her pain is progressed to the point where she can no longer complete her ADLs at home.  She denies any fever or other infectious symptoms, I do not believe that she has epidural abscess or other infectious process.  She denies any new trauma to her back, I do not believe that repeat imaging is indicated.  No new weakness, saddle anesthesia, or other red flags to suggest spinal cord involvement.  I reviewed MRIs from earlier this month and she has severe lumbar stenosis and severe degenerative changes left hip.  She is planned on surgery, but states that she does not have a way of safely being at home.  She is now and they can help her now that her husband is passed away and she cannot do her ADLs herself.  Her pain regimen is only tramadol  herself.  I offered to try to optimize her pain regiment at home, but the patient states that it is not safe for her to return home and any capacity.  However, I do not believe that admission is currently indicated as she does not truly have an inpatient pain requirement when she is not attempting to ambulate.  Unfortunately, we will have no choice but to board the patient in the emergency department until social work consult in the morning can help with placement for the patient.  Home medications ordered, handoff given to oncoming provider.  Final Clinical Impression(s) / ED Diagnoses Final diagnoses:  Chronic left-sided low back pain with left-sided sciatica    Rx / DC Orders ED Discharge Orders     None        Claud Kelp, MD 10/29/20 [id number]    Malvin Johns, MD 10/29/20 [phone]

## 2020-10-28 NOTE — Telephone Encounter (Signed)
Called the requesting office and spoke with [facility]. I informed her that the patient will need an appointment for clearance and that I have tried calling patient on her home and mobile numbers. That I left a detailed voice message on the mobile number and not able to leave a voice message on the home number due to voicemail box is full. Lexine Baton stated that she has been able to contact patient via mobile and that patient was informed by their office her procedure is pending clearance from cardiology. I informed her that I will try calling patient again before end of my shift today. She thanked me for calling.

## 2020-10-28 NOTE — ED Notes (Signed)
Pt here because it's unsafe for her to live at home. Pt has a degenerative L hip and lower back pain (has back surgery scheduled, but can't get hip surgery until after back). Pt has multiple falls because she can't support herself at all. Last fall was a couple weeks ago. Pt said her husband passed away about a month ago and he was her primary caretaker. Pt oriented 4 with a GCS 15. Pt wearing a brace that her PCP placed yesterday. Pt resting in bed. Denies further needs.

## 2020-10-28 NOTE — ED Provider Notes (Signed)
Emergency Medicine Provider Triage Evaluation Note  Summer Hawkins , a 86 y.o. female  was evaluated in triage.  Pt complains of worsening of chronic left hip and lumbar back pain.  Patient reports the pain has gradually gotten worse over the last month.  Patient has had difficulty ambulating and standing at home over the last month.  Patient was told to come to the emergency department today by her doctor for placement in SNF for assisted living.  Patient reports that she had a "a light fall," last week at home.  States that walker moved out of her grip in her to fall landing on her left hip.  Patient denies any her head or any loss of consciousness.  Review of Systems  Positive: Lumbar back pain, left hip pain Negative: Numbness, weakness, neck pain, headache, visual disturbance  Physical Exam  BP (!) 139/59   Pulse 77   Temp 98.2 F (36.8 C) (Oral)   Resp 16   LMP  (LMP Unknown)   SpO2 97%  Gen:   Awake, no distress   Resp:  Normal effort  MSK:   Moves extremities without difficulty, patient has diffuse tenderness to left hip, no shortening or rotation noted to left lower extremity. Other:  Patient has back brace on  Medical Decision Making  Medically screening exam initiated at 1:59 PM.  Appropriate orders placed.  Tita Hillier Kegg was informed that the remainder of the evaluation will be completed by another provider, this initial triage assessment does not replace that evaluation, and the importance of remaining in the ED until their evaluation is complete.  The patient appears stable so that the remainder of the work up may be completed by another provider.      Loni Beckwith, PA-C 10/28/20 1402    Valarie Merino, MD 10/30/20 [phone]

## 2020-10-28 NOTE — Telephone Encounter (Signed)
Called and left a detailed message for the patient stating that I was calling to get her scheduled for a preop appointment before 11/17/20 procedure. Asked to give the office a call back.

## 2020-10-28 NOTE — Telephone Encounter (Signed)
Called and spoke with patient. She is now scheduled to see Dr. Angelena Form on 11/08/20 at 3:30 PM. Patient verbalized understanding and asked if I could mail her this information for she is currently in the hospital emergency department for back pain.

## 2020-10-28 NOTE — Telephone Encounter (Signed)
Patient with diagnosis of afib on Eliquis for anticoagulation.    Procedure: lumbar fusion Date of procedure: 11/17/20  CHA2DS2-VASc Score = 6  This indicates a 9.7% annual risk of stroke. The patient's score is based upon: CHF History: Yes HTN History: Yes Diabetes History: Yes Stroke History: No Vascular Disease History: No Age Score: 2 Gender Score: 1   CrCl 51m/min using adjusted body weight Platelet count 260K  Per office protocol, patient can hold Eliquis for 3 days prior to procedure.

## 2020-10-28 NOTE — Telephone Encounter (Signed)
Called the requesting office back and spoke with Dominica. I informed her of the updated information about patient now being scheduled for 11/08/20 at 3:30 PM with Dr. Angelena Form. She thanked me for calling and will pass the information along.

## 2020-10-28 NOTE — ED Notes (Signed)
Pt appears to be asleep. NAD noted. 

## 2020-10-28 NOTE — Telephone Encounter (Signed)
   Name: Summer Hawkins  DOB: 1934-08-15  MRN: [id number]  Primary Cardiologist: Lauree Chandler, MD  Chart reviewed as part of pre-operative protocol coverage. Because of [name]'s past medical history and time since last visit, she will require a follow-up visit in order to better assess preoperative cardiovascular risk.  Pt has surgery on 11/17/20. She can't stand or walk. She reports that she is actually about to call EMS so she can get setup in assisted living. She needs an urgent appt for clearance .   Dr. Angelena Form - could you either see her on your next office day or do you feel comfortable clearing her for surgery without being seen? She has debilitating back and hip pain. She does not have a history of ischemic heart disease.  Nonischemic stress test in 2012. She was stable when you lasw saw her in Feb 2022.  Pre-op covering staff: - Please schedule appointment and call patient to inform them. If patient already had an upcoming appointment within acceptable timeframe, please add "pre-op clearance" to the appointment notes so provider is aware. - Please contact requesting surgeon's office via preferred method (i.e, phone, fax) to inform them of need for appointment prior to surgery.  If applicable, this message will also be routed to pharmacy pool and/or primary cardiologist for input on holding anticoagulant/antiplatelet agent as requested below so that this information is available to the clearing provider at time of patient's appointment.   Tami Lin Brexley Cutshaw, PA  10/28/2020, 11:41 AM

## 2020-10-29 ENCOUNTER — Encounter (HOSPITAL_COMMUNITY): Payer: Self-pay | Admitting: *Deleted

## 2020-10-29 DIAGNOSIS — M5442 Lumbago with sciatica, left side: Secondary | ICD-10-CM | POA: Diagnosis not present

## 2020-10-29 LAB — CBG MONITORING, ED
Glucose-Capillary: 130 mg/dL — ABNORMAL HIGH (ref 70–99)
Glucose-Capillary: 182 mg/dL — ABNORMAL HIGH (ref 70–99)

## 2020-10-29 LAB — URINALYSIS, ROUTINE W REFLEX MICROSCOPIC
Bilirubin Urine: NEGATIVE
Glucose, UA: NEGATIVE mg/dL
Hgb urine dipstick: NEGATIVE
Ketones, ur: NEGATIVE mg/dL
Nitrite: NEGATIVE
Protein, ur: NEGATIVE mg/dL
Specific Gravity, Urine: 1.015 (ref 1.005–1.030)
pH: 5 (ref 5.0–8.0)

## 2020-10-29 LAB — SARS CORONAVIRUS 2 (TAT 6-24 HRS): SARS Coronavirus 2: NEGATIVE

## 2020-10-29 MED ORDER — METFORMIN HCL 850 MG PO TABS
850.0000 mg | ORAL_TABLET | Freq: Two times a day (BID) | ORAL | Status: DC
  Administered 2020-10-29 – 2020-11-01 (×7): 850 mg via ORAL
  Filled 2020-10-29 (×9): qty 1

## 2020-10-29 MED ORDER — INSULIN GLARGINE-YFGN 100 UNIT/ML ~~LOC~~ SOLN
15.0000 [IU] | Freq: Two times a day (BID) | SUBCUTANEOUS | Status: DC
  Administered 2020-10-29 – 2020-11-01 (×7): 15 [IU] via SUBCUTANEOUS
  Filled 2020-10-29 (×9): qty 0.15

## 2020-10-29 MED ORDER — LEVOTHYROXINE SODIUM 25 MCG PO TABS: 137.0000 ug | ORAL_TABLET | Freq: Every day | ORAL | Status: DC

## 2020-10-29 MED ORDER — IRBESARTAN 300 MG PO TABS
300.0000 mg | ORAL_TABLET | Freq: Every day | ORAL | Status: DC
  Administered 2020-10-29 – 2020-11-01 (×4): 300 mg via ORAL
  Filled 2020-10-29 (×4): qty 1

## 2020-10-29 MED ORDER — AMLODIPINE BESYLATE 5 MG PO TABS
5.0000 mg | ORAL_TABLET | Freq: Every day | ORAL | Status: DC
  Administered 2020-10-29 – 2020-11-01 (×4): 5 mg via ORAL
  Filled 2020-10-29 (×5): qty 1

## 2020-10-29 MED ORDER — ACETAMINOPHEN 500 MG PO TABS
1000.0000 mg | ORAL_TABLET | Freq: Four times a day (QID) | ORAL | Status: DC | PRN
  Administered 2020-10-29 – 2020-11-01 (×2): 1000 mg via ORAL
  Filled 2020-10-29 (×2): qty 2

## 2020-10-29 MED ORDER — METOPROLOL SUCCINATE ER 25 MG PO TB24
25.0000 mg | ORAL_TABLET | Freq: Every day | ORAL | Status: DC
  Administered 2020-10-29 – 2020-11-01 (×4): 25 mg via ORAL
  Filled 2020-10-29 (×4): qty 1

## 2020-10-29 MED ORDER — SPIRONOLACTONE 25 MG PO TABS
25.0000 mg | ORAL_TABLET | Freq: Every day | ORAL | Status: DC
  Administered 2020-10-29 – 2020-11-01 (×4): 25 mg via ORAL
  Filled 2020-10-29 (×4): qty 1

## 2020-10-29 MED ORDER — OXYCODONE HCL 5 MG PO TABS
5.0000 mg | ORAL_TABLET | Freq: Four times a day (QID) | ORAL | Status: DC | PRN
Start: 2020-10-29 — End: 2020-11-01
  Administered 2020-10-29 – 2020-11-01 (×7): 5 mg via ORAL
  Filled 2020-10-29 (×8): qty 1

## 2020-10-29 MED ORDER — INSULIN GLARGINE 100 UNIT/ML ~~LOC~~ SOLN
15.0000 [IU] | Freq: Two times a day (BID) | SUBCUTANEOUS | Status: DC
  Filled 2020-10-29 (×2): qty 0.15

## 2020-10-29 MED ORDER — APIXABAN 5 MG PO TABS
5.0000 mg | ORAL_TABLET | Freq: Two times a day (BID) | ORAL | Status: DC
  Administered 2020-10-29 – 2020-11-01 (×8): 5 mg via ORAL
  Filled 2020-10-29 (×11): qty 1

## 2020-10-29 MED ORDER — LEVOTHYROXINE SODIUM 25 MCG PO TABS
137.0000 ug | ORAL_TABLET | Freq: Every day | ORAL | Status: DC
  Administered 2020-10-29 – 2020-11-01 (×4): 137 ug via ORAL
  Filled 2020-10-29 (×5): qty 1

## 2020-10-29 NOTE — Evaluation (Signed)
Physical Therapy Evaluation Patient Details Name: Summer Hawkins MRN: [id number] DOB: Mar 06, 1935 Today's Date: 10/29/2020   History of Present Illness  Pt is an 86 y/o female presenting to the ED on 8/18 secondary to increased back pain and falls. Reports she has been unable to ambulate the last two days. PMH includes a fib, breast cancer, CKD, DM, HTN.  Clinical Impression  Pt presenting with problem above and deficits below. Pt requiring mod A for bed mobility and max A to stand at EOB. Pt with heavy posterior lean and unsafe to attempt further mobility with +1 assist. Pt reporting increased pain in back and legs. Currently lives alone and has multiple falls. Has had notable decline in function and feel she would benefit from SNF level therapies at d/c. Will continue to follow acutely.     Follow Up Recommendations SNF;Supervision/Assistance - 24 hour    Equipment Recommendations  Wheelchair (measurements PT);Wheelchair cushion (measurements PT);Hospital bed    Recommendations for Other Services       Precautions / Restrictions Precautions Precautions: Fall Precaution Comments: Has had multiple falls at home Restrictions Weight Bearing Restrictions: No      Mobility  Bed Mobility Overal bed mobility: Needs Assistance Bed Mobility: Supine to Sit;Sit to Supine     Supine to sit: Mod assist Sit to supine: Mod assist   General bed mobility comments: Mod A for trunk assist and LE assist. Increased time and effort to come to sitting. Reporting increased back pain in sitting.    Transfers Overall transfer level: Needs assistance Equipment used: 1 person hand held assist Transfers: Sit to/from Stand Sit to Stand: Max assist         General transfer comment: Max A for lift assist and steadying. Posterior lean noted. Further mobility deferred as unsafe to attempt with +1.  Ambulation/Gait                Stairs            Wheelchair Mobility    Modified  Rankin (Stroke Patients Only)       Balance Overall balance assessment: Needs assistance Sitting-balance support: Feet supported;Bilateral upper extremity supported Sitting balance-Leahy Scale: Poor Sitting balance - Comments: Reliant on BUE support   Standing balance support: Bilateral upper extremity supported Standing balance-Leahy Scale: Poor Standing balance comment: Max A to maintain standing balance                             Pertinent Vitals/Pain Pain Assessment: 0-10 Pain Score: 9  Pain Location: back and L hip Pain Descriptors / Indicators: Grimacing;Guarding Pain Intervention(s): Monitored during session;Limited activity within patient's tolerance;Repositioned    Home Living Family/patient expects to be discharged to:: Private residence Living Arrangements: Alone Available Help at Discharge: Family;Available PRN/intermittently (family lives out of state) Type of Home: House Home Access: Stairs to enter Entrance Stairs-Rails: Left Entrance Stairs-Number of Steps: 2 Home Layout: One level Home Equipment: Walker - 2 wheels;Grab bars - tub/shower;Shower seat;Hand held shower head      Prior Function Level of Independence: Independent with assistive device(s)         Comments: Used RW initially, but then had trouble even standing.     Hand Dominance        Extremity/Trunk Assessment   Upper Extremity Assessment Upper Extremity Assessment: Generalized weakness    Lower Extremity Assessment Lower Extremity Assessment: Generalized weakness (increased pain throughout BLE)    Cervical /  Trunk Assessment Cervical / Trunk Assessment: Other exceptions Cervical / Trunk Exceptions: back pain at baseline  Communication   Communication: No difficulties  Cognition Arousal/Alertness: Awake/alert Behavior During Therapy: WFL for tasks assessed/performed Overall Cognitive Status: Within Functional Limits for tasks assessed                                         General Comments      Exercises     Assessment/Plan    PT Assessment Patient needs continued PT services  PT Problem List Decreased strength;Decreased activity tolerance;Decreased balance;Decreased mobility;Decreased knowledge of use of DME;Decreased knowledge of precautions;Pain       PT Treatment Interventions DME instruction;Gait training;Functional mobility training;Stair training;Therapeutic activities;Therapeutic exercise;Neuromuscular re-education;Balance training;Patient/family education    PT Goals (Current goals can be found in the Care Plan section)  Acute Rehab PT Goals Patient Stated Goal: to decrease pain PT Goal Formulation: With patient Time For Goal Achievement: 11/12/20 Potential to Achieve Goals: Fair    Frequency Min 2X/week   Barriers to discharge        Co-evaluation               AM-PAC PT "6 Clicks" Mobility  Outcome Measure Help needed turning from your back to your side while in a flat bed without using bedrails?: A Lot Help needed moving from lying on your back to sitting on the side of a flat bed without using bedrails?: A Lot Help needed moving to and from a bed to a chair (including a wheelchair)?: Total Help needed standing up from a chair using your arms (e.g., wheelchair or bedside chair)?: Total Help needed to walk in hospital room?: Total Help needed climbing 3-5 steps with a railing? : Total 6 Click Score: 8    End of Session Equipment Utilized During Treatment: Gait belt Activity Tolerance: Patient limited by pain Patient left: in bed;with call bell/phone within reach (on stretcher in ED) Nurse Communication: Mobility status PT Visit Diagnosis: Muscle weakness (generalized) (M62.81);Unsteadiness on feet (R26.81);Other abnormalities of gait and mobility (R26.89);Repeated falls (R29.6);History of falling (Z91.81);Pain Pain - part of body:  (back)    Time: 1019-1040 PT Time Calculation (min)  (ACUTE ONLY): 21 min   Charges:   PT Evaluation $PT Eval Moderate Complexity: 1 Mod          Reuel Derby, PT, DPT  Acute Rehabilitation Services  Pager: [phone] Office: [phone]   Rudean Hitt 10/29/2020, 11:18 AM

## 2020-10-29 NOTE — NC FL2 (Signed)
[place] MEDICAID FL2 LEVEL OF CARE SCREENING TOOL     IDENTIFICATION  Patient Name: Summer Hawkins Birthdate: 1934/07/26 Sex: female Admission Date (Current Location): 10/28/2020  [facility] and [place] Number:  Herbalist and Address:  [facility] [place]. [facility], [place] [address], [place], [place] [address]      Provider Number: [id number]  Attending Physician Name and Address:  Default, Provider, MD  Relative Name and Phone Number:  Genella Rife, [phone]    Current Level of Care: SNF Recommended Level of Care: [place] Prior Approval Number:    Date Approved/Denied:   PASRR Number: [id number]  Discharge Plan: SNF    Current Diagnoses: Patient Active Problem List   Diagnosis Date Noted   Posterior capsular opacification, left 03/31/2020   Posterior vitreous detachment of both eyes 03/31/2020   Intermediate stage nonexudative age-related macular degeneration of both eyes 09/25/2019   Moderate nonproliferative diabetic retinopathy of both eyes ([place]) 09/25/2019   Pacemaker 03/20/2019   Symptomatic bradycardia 11/30/2018   Syncope and collapse 11/21/2018   Bradycardia 09/25/2017   Permanent atrial fibrillation (HCC)    Chronic diastolic CHF (congestive heart failure) ([place]) 08/08/2017   Essential hypertension 08/08/2017   Breast cancer of upper-outer quadrant of left female breast ([place]) 01/05/2016   Chronic atrial fibrillation ([place]) 01/05/2011    Orientation RESPIRATION BLADDER Height & Weight     Self, Time, Situation, Place  Normal Incontinent Weight:   Height:     BEHAVIORAL SYMPTOMS/MOOD NEUROLOGICAL BOWEL NUTRITION STATUS      Incontinent    AMBULATORY STATUS COMMUNICATION OF NEEDS Skin   Extensive Assist Verbally Normal                       Personal Care Assistance Level of Assistance  Bathing, Feeding, Dressing Bathing Assistance: Maximum assistance Feeding assistance: Independent Dressing Assistance:  Maximum assistance     Functional Limitations Info  Sight, Hearing, Speech Sight Info: Adequate Hearing Info: Adequate Speech Info: Adequate    SPECIAL CARE FACTORS FREQUENCY                       Contractures Contractures Info: Not present    Additional Factors Info  Code Status, Allergies Code Status Info: Full Code Allergies Info: Bee Venom, Pencillins, Liraglutide, Lisinopril, Zocor           Current Medications (10/29/2020):  This is the current hospital active medication list Current Facility-Administered Medications  Medication Dose Route Frequency Provider Last Rate Last Admin   acetaminophen (TYLENOL) tablet 1,000 mg  1,000 mg Oral Q6H PRN Claud Kelp, MD       amLODipine (NORVASC) tablet 5 mg  5 mg Oral Daily Claud Kelp, MD   5 mg at 10/29/20 0930   apixaban (ELIQUIS) tablet 5 mg  5 mg Oral BID Claud Kelp, MD   5 mg at 10/29/20 0930   insulin glargine-yfgn (SEMGLEE) injection 15 Units  15 Units Subcutaneous BID Malvin Johns, MD   15 Units at 10/29/20 0848   irbesartan (AVAPRO) tablet 300 mg  300 mg Oral Daily Claud Kelp, MD   300 mg at 10/29/20 0930   levothyroxine (SYNTHROID) tablet 137 mcg  137 mcg Oral Q0600 Malvin Johns, MD   137 mcg at 10/29/20 0550   metFORMIN (GLUCOPHAGE) tablet 850 mg  850 mg Oral BID WC Claud Kelp, MD   850 mg at 10/29/20 0929   metoprolol succinate (TOPROL-XL) 24  hr tablet 25 mg  25 mg Oral Daily Claud Kelp, MD   25 mg at 10/29/20 [id number]   oxyCODONE (Oxy IR/ROXICODONE) immediate release tablet 5 mg  5 mg Oral Q6H PRN Claud Kelp, MD       spironolactone (ALDACTONE) tablet 25 mg  25 mg Oral Daily Claud Kelp, MD   25 mg at 10/29/20 0930   Current Outpatient Medications  Medication Sig Dispense Refill   amLODipine (NORVASC) 5 MG tablet Take 5 mg by mouth daily.     anastrozole (ARIMIDEX) 1 MG tablet Take 1 tablet (1 mg total) by mouth daily. 90 tablet 3   apixaban (ELIQUIS) 5 MG TABS tablet Take 1 tablet (5 mg total) by  mouth 2 (two) times daily. 60 tablet    BD PEN NEEDLE NANO U/F 32G X 4 MM MISC 1 application by Other route daily.      diphenhydramine-acetaminophen (TYLENOL PM) 25-500 MG TABS tablet Take 1 tablet by mouth at bedtime as needed (for sleep).     ferrous sulfate 325 (65 FE) MG tablet Take 325 mg by mouth daily with breakfast.     irbesartan (AVAPRO) 300 MG tablet Take 300 mg by mouth daily.     levothyroxine (SYNTHROID) 137 MCG tablet Take 137 mcg by mouth daily.     metFORMIN (GLUCOPHAGE) 850 MG tablet Take 850 mg by mouth 2 (two) times daily with a meal.     metoprolol succinate (TOPROL XL) 25 MG 24 hr tablet Take 1 tablet (25 mg total) by mouth daily. 90 tablet 3   Multiple Vitamins-Minerals (MULTIVITAMIN WITH MINERALS) tablet Take 1 tablet by mouth daily.     ONE TOUCH ULTRA TEST test strip daily. Every 2-3 days     spironolactone (ALDACTONE) 25 MG tablet Take 25 mg by mouth daily. Prescribed by Derm for hair growth per pt.     timolol (BETIMOL) 0.5 % ophthalmic solution Place 1 drop into both eyes daily.     timolol (TIMOPTIC) 0.5 % ophthalmic solution 1 drop every morning.     TOUJEO SOLOSTAR 300 UNIT/ML SOPN Inject 15 Units into the skin See admin instructions. Inject 15 units into the skin in the morning before breakfast and 10 units at bedtime  1   traMADol (ULTRAM) 50 MG tablet Take 50 mg by mouth daily.     LAGEVRIO 200 MG CAPS Take 200 mg by mouth See admin instructions. 4 caps every 12 hours x 5 days (Patient not taking: Reported on 10/28/2020)     MEDROL 4 MG TBPK tablet Take 4 mg by mouth as directed. 6,5,4,3,2,1 (Patient not taking: Reported on 10/28/2020)       Discharge Medications: Please see discharge summary for a list of discharge medications.  Relevant Imaging Results:  Relevant Lab Results:   Additional Information SSN [id number]  Raina Mina, LCSWA

## 2020-10-29 NOTE — ED Notes (Signed)
Pt resting comfortably in the bed. NAD noted. Denies needs.

## 2020-10-29 NOTE — ED Notes (Signed)
Pt stated she had to use restroom. I informed her she is on the [place]. Adjusted pt HOB and turned lights off.

## 2020-10-29 NOTE — ED Notes (Signed)
Meal at the bedside.

## 2020-10-29 NOTE — Progress Notes (Signed)
SNF referrals have been sent for review

## 2020-10-29 NOTE — ED Notes (Signed)
Diet order placed for pt for today. Purewick placed. Pt denies other needs.

## 2020-10-29 NOTE — Progress Notes (Signed)
Patient accepted at [facility]. Bed will not be ready until Monday

## 2020-10-29 NOTE — TOC Initial Note (Signed)
Transition of Care [facility]) - Initial/Assessment Note    Patient Details  Name: Summer Hawkins MRN: [id number] Date of Birth: October 01, 1934  [facility] CM/SW Contact:    Raina Mina, [place] Phone Number: 10/29/2020, 10:13 AM  Clinical Narrative:  CSW discussed with patient about going to a skilled nursing facility if recommended and accepted. Patient was willing to go until she has her surgery on September 7th. Patient stated she cannot walk at this time and feels she is not safe at home. Patient stated she has family but they do not live in the state. Patient requested CSW to contact her granddaughter, Genella Rife. PT eval is pending. CSW is limited to skilled nursing facilities that are willing to accept the Medicare Part A and B wavier without patient being admitted into the hospital for 3 nights.                Expected Discharge Plan: Skilled Nursing Facility Barriers to Discharge: Continued Medical Work up   Patient Goals and CMS Choice Patient states their goals for this hospitalization and ongoing recovery are:: To go home      Expected Discharge Plan and Services Expected Discharge Plan: [place] arrangements for the past 2 months: Single Family Home                                      Prior Living Arrangements/Services Living arrangements for the past 2 months: Single Family Home Lives with:: Self Patient language and need for interpreter reviewed:: No Do you feel safe going back to the place where you live?: No   Patient stated she is unsafe at home and needs help  Need for Family Participation in Patient Care: Yes (Comment) Care giver support system in place?: Yes (comment)   Criminal Activity/Legal Involvement Pertinent to Current Situation/Hospitalization: No - Comment as needed  Activities of Daily Living      Permission Sought/Granted   Permission granted to share information with : Yes, Verbal  Permission Granted  Share Information with NAME: Genella Rife     Permission granted to share info w Relationship: Granddaughter  Permission granted to share info w Contact Information: [phone]  Emotional Assessment Appearance:: Appears stated age Attitude/Demeanor/Rapport: Engaged Affect (typically observed): Accepting Orientation: : Oriented to Self, Oriented to Place, Oriented to  Time, Oriented to Situation Alcohol / Substance Use: Not Applicable Psych Involvement: No (comment)  Admission diagnosis:  hip pain Patient Active Problem List   Diagnosis Date Noted   Posterior capsular opacification, left 03/31/2020   Posterior vitreous detachment of both eyes 03/31/2020   Intermediate stage nonexudative age-related macular degeneration of both eyes 09/25/2019   Moderate nonproliferative diabetic retinopathy of both eyes ([place]) 09/25/2019   Pacemaker 03/20/2019   Symptomatic bradycardia 11/30/2018   Syncope and collapse 11/21/2018   Bradycardia 09/25/2017   Permanent atrial fibrillation (HCC)    Chronic diastolic CHF (congestive heart failure) ([place]) 08/08/2017   Essential hypertension 08/08/2017   Breast cancer of upper-outer quadrant of left female breast ([place]) 01/05/2016   Chronic atrial fibrillation ([place]) 01/05/2011   PCP:  Haywood Pao, MD Pharmacy:   [facility] [place], [place] - [address] [place] [address] Phone: [phone] Fax: [place], [place]  [address] [place] [address] Phone: [phone] Fax: [phone]  [facility]  ([place]) - [place], [place] [address] Phone: [phone] Fax: [phone]     Social Determinants of Health (SDOH) Interventions    Readmission Risk Interventions No flowsheet data  found.

## 2020-10-29 NOTE — ED Notes (Signed)
Pt ate dinner 85% and drank 360 oz. Pt stated " I'm ready for my night medication please." RN notified by this tech

## 2020-10-29 NOTE — Progress Notes (Signed)
CSW spoke with patient in regards to going to a SNF. Patient stated that her husband passed recently and that she cannot stay at home by herself. CSW stated that she contacted [facility] and [facility] to see if they would accept her Medicare Part A and B wavier. Patient stated she preferred [facility] over [facility]. CSW stated she would keep her updated. Patient also wanted CSW to contact her granddaughter Genella Rife, [phone].

## 2020-10-29 NOTE — ED Notes (Signed)
Pt asked when she was being transferred to facility. I informed her we are still waiting on placement.

## 2020-10-29 NOTE — TOC Progression Note (Signed)
Transition of Care [facility]) - Progression Note    Patient Details  Name: Summer Hawkins MRN: [id number] Date of Birth: [birth year]  Transition of Care [facility]) CM/SW Contact  Leeroy Cha, RN Phone Number: 10/29/2020, 8:00 AM  Clinical Narrative:    Tct-granddaughter Genella Rife at [phone]. Understands that Grandmother has fallen several times and could not get up on her own over the past week.  Granddaughter is a Therapist, sports and is acknowledgeable of the situation.  Lives out of state.        Expected Discharge Plan and Services                                                 Social Determinants of Health (SDOH) Interventions    Readmission Risk Interventions No flowsheet data found.

## 2020-10-29 NOTE — Progress Notes (Signed)
CSW spoke with patients granddaughter and gave her an update about placement. Patients granddaughter plans on filing paperwork to become patients power of attorney. CSW provided Ms. Marlowe Sax with [facility] and [facility] who were willing to review patient for SNF placement. CSW told Ms.Garcia she is waiting for patient PT eval to be completed. CSW also told Ms. Marlowe Sax that they may want to look into Medicaid and see if patient qualifies.

## 2020-10-29 NOTE — ED Notes (Signed)
Patient denies pain and is resting comfortably.  

## 2020-10-30 DIAGNOSIS — M5442 Lumbago with sciatica, left side: Secondary | ICD-10-CM | POA: Diagnosis not present

## 2020-10-30 LAB — CBG MONITORING, ED
Glucose-Capillary: 192 mg/dL — ABNORMAL HIGH (ref 70–99)
Glucose-Capillary: 146 mg/dL — ABNORMAL HIGH (ref 70–99)

## 2020-10-30 MED ORDER — DOCUSATE SODIUM 100 MG PO CAPS
100.0000 mg | ORAL_CAPSULE | Freq: Two times a day (BID) | ORAL | Status: DC
  Administered 2020-10-30 – 2020-11-01 (×5): 100 mg via ORAL
  Filled 2020-10-30 (×5): qty 1

## 2020-10-30 NOTE — ED Notes (Signed)
Patient moved from stretcher to hospital bed for comfort.

## 2020-10-30 NOTE — ED Notes (Signed)
Meal tray arrived

## 2020-10-30 NOTE — ED Provider Notes (Signed)
Informed by RN that patient did not have her normal bowel movement today, requesting something to help with constipation.  On my evaluation, patient has a soft nontender abdomen.  Patient states she is normally very regular, but today did she did not have a bowel movement.  She is on pain medication, more than normal.  She has been on pain medicine before without issues with constipation.  She is not on any stool softeners.  We will start her on Colace as she is on opioids, encourage hydration.  Encourage patient to reach out if she starts developing nausea, vomiting, or abdominal pain.   Franchot Heidelberg, PA-C 10/30/20 1045    Isla Pence, MD 10/31/20 [phone]

## 2020-10-30 NOTE — ED Notes (Signed)
Breakfast Ordered 

## 2020-10-30 NOTE — ED Notes (Signed)
Patient didn't eat much of her dinner tray. Patient states she wasn't that hungry.

## 2020-10-31 DIAGNOSIS — M5442 Lumbago with sciatica, left side: Secondary | ICD-10-CM | POA: Diagnosis not present

## 2020-10-31 LAB — CBG MONITORING, ED
Glucose-Capillary: 189 mg/dL — ABNORMAL HIGH (ref 70–99)
Glucose-Capillary: 123 mg/dL — ABNORMAL HIGH (ref 70–99)

## 2020-10-31 MED ORDER — MELATONIN 3 MG PO TABS
3.0000 mg | ORAL_TABLET | Freq: Every day | ORAL | Status: DC
  Administered 2020-10-31: 3 mg via ORAL
  Filled 2020-10-31: qty 1

## 2020-10-31 MED ORDER — POLYETHYLENE GLYCOL 3350 17 G PO PACK
17.0000 g | PACK | Freq: Every day | ORAL | Status: DC
  Administered 2020-10-31 – 2020-11-01 (×2): 17 g via ORAL
  Filled 2020-10-31 (×2): qty 1

## 2020-10-31 MED ORDER — BISACODYL 5 MG PO TBEC
5.0000 mg | DELAYED_RELEASE_TABLET | Freq: Once | ORAL | Status: AC
  Administered 2020-10-31: 5 mg via ORAL
  Filled 2020-10-31: qty 1

## 2020-10-31 NOTE — ED Notes (Signed)
Pt called out to ask to use the bathroom for a BM. This Probation officer asked RN on how pt moves, the RN made this Probation officer aware that she has on a female external catheter (Purewick). Pt then asked to be wheelchair-ed to the bathroom and helped into the toilet. Writer was not aware that pt could not walk nor stand. Pt then asked this writer to "lift her up" and she would then "scoot" to the toilet. This Probation officer did no feel comfortable due to pt being a x2 assist. Bedpan was introduced to pt, but pt stated "I can't go in the bedpan.". This Probation officer is aware that pt tried the bedpan last night but pt stated "I tried the bedpan 5 days ago". This Probation officer did convince pt to use bedpan. Awaiting for pt to call back now, RN [facility] notified

## 2020-10-31 NOTE — Care Management (Signed)
Patient is active with [facility]

## 2020-10-31 NOTE — ED Notes (Addendum)
Summer Hawkins, [place] re: patient request for laxative/constipation.

## 2020-10-31 NOTE — ED Notes (Signed)
Pt informed this RN and insisted that she should have her Preop visit tomorrow/ prior to discharging her. States she has a scheduled surgery here in [facility] on Sept 6. Pt states it would be harder for her to come back for a preop visit, to arrange ride, if this is not done prior to discharge. Insisted for a provider/ staff to call Preop department tomorrow to arrange Preop visit while she's here in the hospital.

## 2020-10-31 NOTE — ED Notes (Signed)
Breakfast Ordered 

## 2020-11-01 DIAGNOSIS — M6281 Muscle weakness (generalized): Secondary | ICD-10-CM | POA: Diagnosis not present

## 2020-11-01 DIAGNOSIS — R262 Difficulty in walking, not elsewhere classified: Secondary | ICD-10-CM | POA: Diagnosis not present

## 2020-11-01 DIAGNOSIS — M48061 Spinal stenosis, lumbar region without neurogenic claudication: Secondary | ICD-10-CM | POA: Diagnosis not present

## 2020-11-01 DIAGNOSIS — Z79899 Other long term (current) drug therapy: Secondary | ICD-10-CM | POA: Diagnosis not present

## 2020-11-01 DIAGNOSIS — Z7901 Long term (current) use of anticoagulants: Secondary | ICD-10-CM | POA: Diagnosis not present

## 2020-11-01 DIAGNOSIS — Z419 Encounter for procedure for purposes other than remedying health state, unspecified: Secondary | ICD-10-CM | POA: Diagnosis not present

## 2020-11-01 DIAGNOSIS — Z79890 Hormone replacement therapy (postmenopausal): Secondary | ICD-10-CM | POA: Diagnosis not present

## 2020-11-01 DIAGNOSIS — M5430 Sciatica, unspecified side: Secondary | ICD-10-CM | POA: Diagnosis not present

## 2020-11-01 DIAGNOSIS — Z91030 Bee allergy status: Secondary | ICD-10-CM | POA: Diagnosis not present

## 2020-11-01 DIAGNOSIS — M5116 Intervertebral disc disorders with radiculopathy, lumbar region: Secondary | ICD-10-CM | POA: Diagnosis not present

## 2020-11-01 DIAGNOSIS — I4891 Unspecified atrial fibrillation: Secondary | ICD-10-CM | POA: Diagnosis not present

## 2020-11-01 DIAGNOSIS — Z741 Need for assistance with personal care: Secondary | ICD-10-CM | POA: Diagnosis not present

## 2020-11-01 DIAGNOSIS — M2550 Pain in unspecified joint: Secondary | ICD-10-CM | POA: Diagnosis not present

## 2020-11-01 DIAGNOSIS — I482 Chronic atrial fibrillation, unspecified: Secondary | ICD-10-CM | POA: Diagnosis not present

## 2020-11-01 DIAGNOSIS — M4326 Fusion of spine, lumbar region: Secondary | ICD-10-CM | POA: Diagnosis not present

## 2020-11-01 DIAGNOSIS — D649 Anemia, unspecified: Secondary | ICD-10-CM | POA: Diagnosis not present

## 2020-11-01 DIAGNOSIS — Z7984 Long term (current) use of oral hypoglycemic drugs: Secondary | ICD-10-CM | POA: Diagnosis not present

## 2020-11-01 DIAGNOSIS — M4316 Spondylolisthesis, lumbar region: Secondary | ICD-10-CM | POA: Diagnosis not present

## 2020-11-01 DIAGNOSIS — E119 Type 2 diabetes mellitus without complications: Secondary | ICD-10-CM | POA: Diagnosis not present

## 2020-11-01 DIAGNOSIS — Z90710 Acquired absence of both cervix and uterus: Secondary | ICD-10-CM | POA: Diagnosis not present

## 2020-11-01 DIAGNOSIS — M1990 Unspecified osteoarthritis, unspecified site: Secondary | ICD-10-CM | POA: Diagnosis not present

## 2020-11-01 DIAGNOSIS — R278 Other lack of coordination: Secondary | ICD-10-CM | POA: Diagnosis not present

## 2020-11-01 DIAGNOSIS — I129 Hypertensive chronic kidney disease with stage 1 through stage 4 chronic kidney disease, or unspecified chronic kidney disease: Secondary | ICD-10-CM | POA: Diagnosis not present

## 2020-11-01 DIAGNOSIS — I4821 Permanent atrial fibrillation: Secondary | ICD-10-CM | POA: Diagnosis not present

## 2020-11-01 DIAGNOSIS — M545 Low back pain, unspecified: Secondary | ICD-10-CM | POA: Diagnosis not present

## 2020-11-01 DIAGNOSIS — E785 Hyperlipidemia, unspecified: Secondary | ICD-10-CM | POA: Diagnosis present

## 2020-11-01 DIAGNOSIS — Z20822 Contact with and (suspected) exposure to covid-19: Secondary | ICD-10-CM | POA: Diagnosis not present

## 2020-11-01 DIAGNOSIS — M48062 Spinal stenosis, lumbar region with neurogenic claudication: Secondary | ICD-10-CM | POA: Diagnosis not present

## 2020-11-01 DIAGNOSIS — C50412 Malignant neoplasm of upper-outer quadrant of left female breast: Secondary | ICD-10-CM | POA: Diagnosis not present

## 2020-11-01 DIAGNOSIS — Z833 Family history of diabetes mellitus: Secondary | ICD-10-CM | POA: Diagnosis not present

## 2020-11-01 DIAGNOSIS — G479 Sleep disorder, unspecified: Secondary | ICD-10-CM | POA: Diagnosis not present

## 2020-11-01 DIAGNOSIS — E11319 Type 2 diabetes mellitus with unspecified diabetic retinopathy without macular edema: Secondary | ICD-10-CM | POA: Diagnosis not present

## 2020-11-01 DIAGNOSIS — I5030 Unspecified diastolic (congestive) heart failure: Secondary | ICD-10-CM | POA: Diagnosis not present

## 2020-11-01 DIAGNOSIS — Z950 Presence of cardiac pacemaker: Secondary | ICD-10-CM | POA: Diagnosis not present

## 2020-11-01 DIAGNOSIS — I10 Essential (primary) hypertension: Secondary | ICD-10-CM | POA: Diagnosis not present

## 2020-11-01 DIAGNOSIS — I5032 Chronic diastolic (congestive) heart failure: Secondary | ICD-10-CM | POA: Diagnosis not present

## 2020-11-01 DIAGNOSIS — Z880 Allergy status to penicillin: Secondary | ICD-10-CM | POA: Diagnosis not present

## 2020-11-01 DIAGNOSIS — E538 Deficiency of other specified B group vitamins: Secondary | ICD-10-CM | POA: Diagnosis not present

## 2020-11-01 DIAGNOSIS — R2681 Unsteadiness on feet: Secondary | ICD-10-CM | POA: Diagnosis not present

## 2020-11-01 DIAGNOSIS — G8929 Other chronic pain: Secondary | ICD-10-CM | POA: Diagnosis not present

## 2020-11-01 DIAGNOSIS — N189 Chronic kidney disease, unspecified: Secondary | ICD-10-CM | POA: Diagnosis not present

## 2020-11-01 DIAGNOSIS — Z8249 Family history of ischemic heart disease and other diseases of the circulatory system: Secondary | ICD-10-CM | POA: Diagnosis not present

## 2020-11-01 DIAGNOSIS — N183 Chronic kidney disease, stage 3 unspecified: Secondary | ICD-10-CM | POA: Diagnosis not present

## 2020-11-01 DIAGNOSIS — Z853 Personal history of malignant neoplasm of breast: Secondary | ICD-10-CM | POA: Diagnosis not present

## 2020-11-01 DIAGNOSIS — M5442 Lumbago with sciatica, left side: Secondary | ICD-10-CM | POA: Diagnosis not present

## 2020-11-01 DIAGNOSIS — Z803 Family history of malignant neoplasm of breast: Secondary | ICD-10-CM | POA: Diagnosis not present

## 2020-11-01 DIAGNOSIS — E039 Hypothyroidism, unspecified: Secondary | ICD-10-CM | POA: Diagnosis not present

## 2020-11-01 DIAGNOSIS — R269 Unspecified abnormalities of gait and mobility: Secondary | ICD-10-CM | POA: Diagnosis not present

## 2020-11-01 DIAGNOSIS — Z888 Allergy status to other drugs, medicaments and biological substances status: Secondary | ICD-10-CM | POA: Diagnosis not present

## 2020-11-01 DIAGNOSIS — Z01810 Encounter for preprocedural cardiovascular examination: Secondary | ICD-10-CM | POA: Diagnosis not present

## 2020-11-01 DIAGNOSIS — Z981 Arthrodesis status: Secondary | ICD-10-CM | POA: Diagnosis not present

## 2020-11-01 DIAGNOSIS — M549 Dorsalgia, unspecified: Secondary | ICD-10-CM | POA: Diagnosis not present

## 2020-11-01 DIAGNOSIS — E1122 Type 2 diabetes mellitus with diabetic chronic kidney disease: Secondary | ICD-10-CM | POA: Diagnosis not present

## 2020-11-01 DIAGNOSIS — R400 Somnolence: Secondary | ICD-10-CM | POA: Diagnosis not present

## 2020-11-01 DIAGNOSIS — Z7401 Bed confinement status: Secondary | ICD-10-CM | POA: Diagnosis not present

## 2020-11-01 DIAGNOSIS — E113393 Type 2 diabetes mellitus with moderate nonproliferative diabetic retinopathy without macular edema, bilateral: Secondary | ICD-10-CM | POA: Diagnosis not present

## 2020-11-01 DIAGNOSIS — C50919 Malignant neoplasm of unspecified site of unspecified female breast: Secondary | ICD-10-CM | POA: Diagnosis not present

## 2020-11-01 LAB — BASIC METABOLIC PANEL
Anion gap: 11 (ref 5–15)
Anion gap: 9 (ref 5–15)
Anion gap: 11 (ref 5–15)
BUN: 39 mg/dL — ABNORMAL HIGH (ref 8–23)
BUN: 38 mg/dL — ABNORMAL HIGH (ref 8–23)
BUN: 38 mg/dL — ABNORMAL HIGH (ref 8–23)
CO2: 15 mmol/L — ABNORMAL LOW (ref 22–32)
CO2: 19 mmol/L — ABNORMAL LOW (ref 22–32)
CO2: 23 mmol/L (ref 22–32)
Calcium: 9.5 mg/dL (ref 8.9–10.3)
Calcium: 9.3 mg/dL (ref 8.9–10.3)
Calcium: 9.4 mg/dL (ref 8.9–10.3)
Chloride: 105 mmol/L (ref 98–111)
Chloride: 100 mmol/L (ref 98–111)
Chloride: 99 mmol/L (ref 98–111)
Creatinine, Ser: 1.79 mg/dL — ABNORMAL HIGH (ref 0.44–1.00)
Creatinine, Ser: 1.84 mg/dL — ABNORMAL HIGH (ref 0.44–1.00)
Creatinine, Ser: 1.95 mg/dL — ABNORMAL HIGH (ref 0.44–1.00)
GFR, Estimated: 28 mL/min — ABNORMAL LOW (ref >60)
GFR, Estimated: 27 mL/min — ABNORMAL LOW (ref >60)
GFR, Estimated: 25 mL/min — ABNORMAL LOW (ref >60)
Glucose, Bld: 94 mg/dL (ref 70–99)
Glucose, Bld: 158 mg/dL — ABNORMAL HIGH (ref 70–99)
Glucose, Bld: 223 mg/dL — ABNORMAL HIGH (ref 70–99)
Potassium: 6 mmol/L — ABNORMAL HIGH (ref 3.5–5.1)
Potassium: 6.2 mmol/L — ABNORMAL HIGH (ref 3.5–5.1)
Potassium: 4.9 mmol/L (ref 3.5–5.1)
Sodium: 131 mmol/L — ABNORMAL LOW (ref 135–145)
Sodium: 128 mmol/L — ABNORMAL LOW (ref 135–145)
Sodium: 133 mmol/L — ABNORMAL LOW (ref 135–145)

## 2020-11-01 NOTE — ED Notes (Signed)
Pt d/c'd to [facility] by PTAR. VSS. GCS 15. I called Nunzio Cory and let her know she was on her way.

## 2020-11-01 NOTE — ED Notes (Signed)
PTAR arrived to transport pt. BMP from 1025 not resulted. RN contacted lab, told they would check on BMP status. [place] MD informed, PTAR told that pt cannot leave until BMP has resulted.

## 2020-11-01 NOTE — ED Notes (Signed)
I introduced myself to pt. Pt said pain in L hip/leg is much better. Aware she's waiting on PTAR and me to give report. Purewick remains. Denies further needs.

## 2020-11-01 NOTE — Discharge Instructions (Addendum)
Please follow-up with your back doctor in regards to getting ready for your upcoming back surgery.

## 2020-11-01 NOTE — Progress Notes (Signed)
Patient will be going to room 3214 at [facility] today. The bed will not be ready until 11:30 AM. CSW also emailed AVS to admissions.

## 2020-11-01 NOTE — ED Notes (Signed)
Lab contacted. New (3rd) specimen running, pending new potassium (BMP).

## 2020-11-01 NOTE — ED Notes (Signed)
AVS received. Dr. Sherry Ruffing EDP in to see, CSW coordinating transfer. AVS being faxed to [facility], pending OK to d/c.

## 2020-11-01 NOTE — ED Provider Notes (Signed)
8:31 AM Per social work, patient is been accepted to [facility].  She will be discharged this morning to go to [facility] facility.   8:43 AM As patient was getting ready for discharge planning, came to our attention that her potassium was slightly elevated this morning however the sample did appear to be slightly hemolyzed.  We will repeat to make sure there is no significant electrolyte abnormality that would preclude her from being able to go home.  Anticipate redraw and discharge to [facility] if it is reassuring.   10:16 AM Repeat was again hemolyzed, will try repeating 1 more time.   Repeat was within normal limits in regards to potassium.  Patient will be discharged as per previous plan for outpatient follow-up and further management.  Clinical Impression: 1. Chronic left-sided low back pain with left-sided sciatica     Disposition: Discharge  Condition: Good  I have discussed the results, Dx and Tx plan with the pt(& family if present). He/she/they expressed understanding and agree(s) with the plan. Discharge instructions discussed at great length. Strict return precautions discussed and pt &/or family have verbalized understanding of the instructions. No further questions at time of discharge.    New Prescriptions   No medications on file    Follow Up: follow up with your back doctor in regards to your upcoming surgery         Georgia Delsignore, Gwenyth Allegra, MD 11/01/20 1303

## 2020-11-01 NOTE — ED Notes (Signed)
Pt has attempted to move bowels a couple of times unsuccessfully after medicines.

## 2020-11-01 NOTE — ED Notes (Signed)
Attempted to call report to [facility], said they will call me back.

## 2020-11-01 NOTE — ED Notes (Signed)
Pt has been resting quietly since med pass.

## 2020-11-02 DIAGNOSIS — C50919 Malignant neoplasm of unspecified site of unspecified female breast: Secondary | ICD-10-CM | POA: Diagnosis not present

## 2020-11-02 DIAGNOSIS — I10 Essential (primary) hypertension: Secondary | ICD-10-CM | POA: Diagnosis not present

## 2020-11-02 DIAGNOSIS — M5430 Sciatica, unspecified side: Secondary | ICD-10-CM | POA: Diagnosis not present

## 2020-11-02 DIAGNOSIS — E11319 Type 2 diabetes mellitus with unspecified diabetic retinopathy without macular edema: Secondary | ICD-10-CM | POA: Diagnosis not present

## 2020-11-02 DIAGNOSIS — E785 Hyperlipidemia, unspecified: Secondary | ICD-10-CM | POA: Diagnosis not present

## 2020-11-02 DIAGNOSIS — I5030 Unspecified diastolic (congestive) heart failure: Secondary | ICD-10-CM | POA: Diagnosis not present

## 2020-11-02 DIAGNOSIS — E119 Type 2 diabetes mellitus without complications: Secondary | ICD-10-CM | POA: Diagnosis not present

## 2020-11-02 DIAGNOSIS — M1990 Unspecified osteoarthritis, unspecified site: Secondary | ICD-10-CM | POA: Diagnosis not present

## 2020-11-02 DIAGNOSIS — M545 Low back pain, unspecified: Secondary | ICD-10-CM | POA: Diagnosis not present

## 2020-11-02 DIAGNOSIS — I4891 Unspecified atrial fibrillation: Secondary | ICD-10-CM | POA: Diagnosis not present

## 2020-11-02 DIAGNOSIS — E039 Hypothyroidism, unspecified: Secondary | ICD-10-CM | POA: Diagnosis not present

## 2020-11-02 DIAGNOSIS — D649 Anemia, unspecified: Secondary | ICD-10-CM | POA: Diagnosis not present

## 2020-11-05 DIAGNOSIS — M549 Dorsalgia, unspecified: Secondary | ICD-10-CM | POA: Diagnosis not present

## 2020-11-05 DIAGNOSIS — M1990 Unspecified osteoarthritis, unspecified site: Secondary | ICD-10-CM | POA: Diagnosis not present

## 2020-11-05 DIAGNOSIS — R269 Unspecified abnormalities of gait and mobility: Secondary | ICD-10-CM | POA: Diagnosis not present

## 2020-11-05 DIAGNOSIS — M48061 Spinal stenosis, lumbar region without neurogenic claudication: Secondary | ICD-10-CM | POA: Diagnosis not present

## 2020-11-08 ENCOUNTER — Encounter: Payer: Self-pay | Admitting: Cardiovascular Disease

## 2020-11-08 ENCOUNTER — Ambulatory Visit (INDEPENDENT_AMBULATORY_CARE_PROVIDER_SITE_OTHER): Payer: Medicare Other | Admitting: Cardiovascular Disease

## 2020-11-08 ENCOUNTER — Other Ambulatory Visit: Payer: Self-pay

## 2020-11-08 VITALS — BP 110/70 | HR 62 | Ht 64.5 in | Wt 188.0 lb

## 2020-11-08 DIAGNOSIS — I5030 Unspecified diastolic (congestive) heart failure: Secondary | ICD-10-CM | POA: Diagnosis not present

## 2020-11-08 DIAGNOSIS — I5032 Chronic diastolic (congestive) heart failure: Secondary | ICD-10-CM | POA: Diagnosis not present

## 2020-11-08 DIAGNOSIS — I10 Essential (primary) hypertension: Secondary | ICD-10-CM | POA: Diagnosis not present

## 2020-11-08 DIAGNOSIS — M545 Low back pain, unspecified: Secondary | ICD-10-CM | POA: Diagnosis not present

## 2020-11-08 DIAGNOSIS — I4821 Permanent atrial fibrillation: Secondary | ICD-10-CM

## 2020-11-08 DIAGNOSIS — M5430 Sciatica, unspecified side: Secondary | ICD-10-CM | POA: Diagnosis not present

## 2020-11-08 DIAGNOSIS — N183 Chronic kidney disease, stage 3 unspecified: Secondary | ICD-10-CM | POA: Diagnosis not present

## 2020-11-08 DIAGNOSIS — Z01810 Encounter for preprocedural cardiovascular examination: Secondary | ICD-10-CM

## 2020-11-08 DIAGNOSIS — I4891 Unspecified atrial fibrillation: Secondary | ICD-10-CM | POA: Diagnosis not present

## 2020-11-08 DIAGNOSIS — M1990 Unspecified osteoarthritis, unspecified site: Secondary | ICD-10-CM | POA: Diagnosis not present

## 2020-11-08 NOTE — Patient Instructions (Signed)

## 2020-11-08 NOTE — Progress Notes (Signed)
Chief Complaint  Patient presents with   Follow-up    Heart block, atrial fibrillation    History of Present Illness: 86 yo female with with history of persistent atrial fibrillation, bradycardia, DM, hypothyroidism, HTN and hyperlipidemia today for cardiac followup. She was found to have atrial fibrillation in 2012. She had no awareness of palpitations. No stress induced ischemia on nuclear study. Low risk study. Normal LVEF. I started Toprol XL and Pradaxa. She has since been changed to Eliquis. Echo on 01/12/11 with normal LV size and function, LVEF of 55-60%, mild LVH, moderate TR, mild MR. She was cardioverted on 02/27/11. She was seen in primary care in May 2019 with dyspnea and weakness. HR was in the 40s on 06/22/17 with atrial fib. Her Toprol dose was reduced to 25 mg daily. She was felt to be volume overloaded and was started on Lasix 20 mg daily. I saw her in our office 07/13/17 and her heart rate was improved. Her symptoms had resolved. 48 hour cardiac monitor May 2019 showed atrial fib with lowest heart rate 57 bpm, PVCs. Echo May 2019 with LvEF=55-60%, no wall motion abnormalities. No significant valve disease. She was admitted to [facility] July 16-18, 2019 with symptomatic bradycardia and decompensated CHF. Her heart rate was in the 40s. Her beta blocker was stopped and her heart rate improved. She was diuresed with IV Lasix. Norvasc added due to uncontrolled HTN. She was seen in EP clinic November 2019 and it was felt that she did not need a pacemaker. Event monitor November 2019 with no high grade AV block. She was seen in the ED 11/15/18 after a syncopal event and was found to be in atrial fibrillation. She was orthostatic at office follow up 11/21/18. She was readmitted with syncope and bradycardia 11/30/18 and had a pacemaker placed. No recurrent syncope following pacemaker placement.   She is here today for follow up. The patient denies any chest pain, dyspnea, palpitations, lower extremity  edema, orthopnea, PND, dizziness, near syncope or syncope. Upcoming back surgery on 11/17/20 with Dr. Arnoldo Morale.   Primary Care Physician: Haywood Pao, MD  Past Medical History:  Diagnosis Date   Atrial fibrillation [facility])    Breast cancer [facility])    Breast cancer, left ([place])    Chronic kidney disease    stage III - patient was unaware   Diabetes mellitus    Dyspnea    Dysrhythmia    Afib   H/O: hysterectomy    History of colonoscopy 01/25/2010   History of mammogram 08/04/2009   Hyperlipidemia    Hypertension    Hypothyroidism    Ketoacidosis, diabetic, no coma, non-insulin dependent    Type II   Vitamin B12 deficiency     Past Surgical History:  Procedure Laterality Date   ABDOMINAL HYSTERECTOMY     Bilateral foot surgery     BREAST EXCISIONAL BIOPSY Left 12/2015   BREAST LUMPECTOMY Left    2017   BREAST LUMPECTOMY WITH RADIOACTIVE SEED AND SENTINEL LYMPH NODE BIOPSY Left 01/19/2016   Procedure: LEFT BREAST LUMPECTOMY WITH RADIOACTIVE SEED AND SENTINEL LYMPH NODE BIOPSY;  Surgeon: Autumn Messing III, MD;  Location: [place];  Service: General;  Laterality: Left;   BREAST LUMPECTOMY WITH RADIOACTIVE SEED LOCALIZATION Left 01/19/2016   CARDIOVERSION  02/27/2011   Procedure: CARDIOVERSION;  Surgeon: Loralie Champagne, MD;  Location: [place];  Service: Cardiovascular;  Laterality: N/A;   LAPAROSCOPIC CHOLECYSTECTOMY     PACEMAKER IMPLANT N/A 12/02/2018   Procedure: PACEMAKER  IMPLANT;  Surgeon: Evans Lance, MD;  Location: [place] CV LAB;  Service: Cardiovascular;  Laterality: N/A;   ROTATOR CUFF REPAIR Left    TUBAL LIGATION      Current Outpatient Medications  Medication Sig Dispense Refill   amLODipine (NORVASC) 5 MG tablet Take 5 mg by mouth daily.     anastrozole (ARIMIDEX) 1 MG tablet Take 1 tablet (1 mg total) by mouth daily. 90 tablet 3   apixaban (ELIQUIS) 5 MG TABS tablet Take 1 tablet (5 mg total) by mouth 2 (two) times daily. 60 tablet    BD PEN NEEDLE NANO U/F 32G X  4 MM MISC 1 application by Other route daily.      diphenhydramine-acetaminophen (TYLENOL PM) 25-500 MG TABS tablet Take 1 tablet by mouth at bedtime as needed (for sleep).     ferrous sulfate 325 (65 FE) MG tablet Take 325 mg by mouth daily with breakfast.     irbesartan (AVAPRO) 300 MG tablet Take 300 mg by mouth daily.     LAGEVRIO 200 MG CAPS Take 200 mg by mouth See admin instructions. 4 caps every 12 hours x 5 days     levothyroxine (SYNTHROID) 137 MCG tablet Take 137 mcg by mouth daily.     MEDROL 4 MG TBPK tablet Take 4 mg by mouth as directed. 6,5,4,3,2,1     metFORMIN (GLUCOPHAGE) 850 MG tablet Take 850 mg by mouth 2 (two) times daily with a meal.     metoprolol succinate (TOPROL XL) 25 MG 24 hr tablet Take 1 tablet (25 mg total) by mouth daily. 90 tablet 3   Multiple Vitamins-Minerals (MULTIVITAMIN WITH MINERALS) tablet Take 1 tablet by mouth daily.     ONE TOUCH ULTRA TEST test strip daily. Every 2-3 days     spironolactone (ALDACTONE) 25 MG tablet Take 25 mg by mouth daily. Prescribed by Derm for hair growth per pt.     timolol (BETIMOL) 0.5 % ophthalmic solution Place 1 drop into both eyes daily.     timolol (TIMOPTIC) 0.5 % ophthalmic solution 1 drop every morning.     TOUJEO SOLOSTAR 300 UNIT/ML SOPN Inject 15 Units into the skin See admin instructions. Inject 15 units into the skin in the morning before breakfast and 10 units at bedtime  1   traMADol (ULTRAM) 50 MG tablet Take 50 mg by mouth daily.     No current facility-administered medications for this visit.    Allergies  Allergen Reactions   Bee Venom Shortness Of Breath, Nausea And Vomiting and Other (See Comments)    Makes the patient feel faint, also   Penicillins Anaphylaxis, Hives, Swelling and Other (See Comments)    Has patient had a PCN reaction causing immediate rash, facial/tongue/throat swelling, SOB or lightheadedness with hypotension: Yes Has patient had a PCN reaction causing severe rash involving mucus  membranes or skin necrosis: No Has patient had a PCN reaction that required hospitalization: No Has patient had a PCN reaction occurring within the last 10 years: No If all of the above answers are "NO", then may proceed with Cephalosporin use.    Liraglutide     Other reaction(s): Rash at injection site   Lisinopril     Other reaction(s): cough   Zocor [Simvastatin]     Other reaction(s): memory changes    Social History   Socioeconomic History   Marital status: Widowed    Spouse name: Not on file   Number of children: 2  Years of education: Not on file   Highest education level: Not on file  Occupational History    Employer: OTHER    Comment: Worked at [employer] Use   Smoking status: Never   Smokeless tobacco: Never  Vaping Use   Vaping Use: Never used  Substance and Sexual Activity   Alcohol use: No    Alcohol/week: 0.0 standard drinks   Drug use: No   Sexual activity: Not on file  Other Topics Concern   Not on file  Social History Narrative   Patient since 33   Husband with prostate cancer   10-siblings-no cancer   Social Determinants of Health   Financial Resource Strain: Not on file  Food Insecurity: Not on file  Transportation Needs: Not on file  Physical Activity: Not on file  Stress: Not on file  Social Connections: Not on file  Intimate Partner Violence: Not on file    Family History  Problem Relation Age of Onset   Heart failure Father 10       enlarged heart   Pneumonia Mother 57   Diabetes Mother 70   Breast cancer Sister    Cancer Neg Hx     Review of Systems:  As stated in the HPI and otherwise negative.   BP 110/70   Pulse 62   Ht 5' 4.5" (1.638 m)   Wt 188 lb (85.3 kg)   LMP  (LMP Unknown)   SpO2 99%   BMI 31.77 kg/m   Physical Examination:  General: Well developed, well nourished, NAD  HEENT: OP clear, mucus membranes moist  SKIN: warm, dry. No rashes. Neuro: No focal deficits  Musculoskeletal: Muscle strength  5/5 all ext  Psychiatric: Mood and affect normal  Neck: No JVD, no carotid bruits, no thyromegaly, no lymphadenopathy.  Lungs:Clear bilaterally, no wheezes, rhonci, crackles Cardiovascular: Regular rate and rhythm. No murmurs, gallops or rubs. Abdomen:Soft. Bowel sounds present. Non-tender.  Extremities: No lower extremity edema. Pulses are 2 + in the bilateral DP/PT.  Echo May 2019: - Left ventricle: The cavity size was normal. Wall thickness was   increased in a pattern of mild LVH. Systolic function was normal.   The estimated ejection fraction was in the range of 55% to 60%.   Wall motion was normal; there were no regional wall motion   abnormalities. - Mitral valve: Mildly to moderately calcified annulus. Mildly   thickened leaflets . - Left atrium: The atrium was mildly dilated. - Pulmonary arteries: Systolic pressure was mildly increased. PA   peak pressure: 36 mm Hg (S).  EKG:  EKG is not ordered today. The ekg ordered today demonstrates   Recent Labs: 10/28/2020: ALT 15; Hemoglobin 13.5; Platelets 352 11/01/2020: BUN 38; Creatinine, Ser 1.95; Potassium 4.9; Sodium 133   Lipid Panel No results found for: CHOL, TRIG, HDL, CHOLHDL, VLDL, LDLCALC, LDLDIRECT   Wt Readings from Last 3 Encounters:  11/08/20 188 lb (85.3 kg)  09/16/20 208 lb 12.8 oz (94.7 kg)  05/07/20 215 lb 3.2 oz (97.6 kg)     Other studies Reviewed: Additional studies/ records that were reviewed today include: . Review of the above records demonstrates:    Assessment and Plan:   1. Atrial fibrillation, permanent/Symptomatic Bradycardia: Atrial fib today on exam. Her heart rate is controlled. Permanent pacemaker is in place. Continue Toprol and Eliquis.      2. HTN: BP is well controlled. No change in current therapy  3. Mitral valve insufficiency: Trivial by echo May  2019.   4. Chronic diastolic CHF: No volume overload on exam. Weight is stable. Lasix as needed.     5. Pre-operative  cardiovascular examination: Upcoming back surgery. She has no angina, evidence of CHF. Stable, rate controlled atrial fibrillation. OK to proceed with her back surgery. She will hold Eliquis 3 days before her surgery.   Current medicines are reviewed at length with the patient today.  The patient does not have concerns regarding medicines.  The following changes have been made:  no change  Labs/ tests ordered today include:   No orders of the defined types were placed in this encounter.  Follow up  Signed, Lauree Chandler, MD 11/08/2020 3:59 PM    [address], [place], [place]  [address] Phone: [phone]; Fax: [phone]

## 2020-11-10 ENCOUNTER — Telehealth: Payer: Self-pay | Admitting: *Deleted

## 2020-11-10 NOTE — Telephone Encounter (Signed)
Tct-=pt at [facility], doing well awating surg.  Card for bleesing sent.

## 2020-11-12 ENCOUNTER — Encounter: Payer: Self-pay | Admitting: Internal Medicine

## 2020-11-12 DIAGNOSIS — I4891 Unspecified atrial fibrillation: Secondary | ICD-10-CM | POA: Diagnosis not present

## 2020-11-12 DIAGNOSIS — G479 Sleep disorder, unspecified: Secondary | ICD-10-CM | POA: Diagnosis not present

## 2020-11-12 DIAGNOSIS — I10 Essential (primary) hypertension: Secondary | ICD-10-CM | POA: Diagnosis not present

## 2020-11-12 DIAGNOSIS — M1990 Unspecified osteoarthritis, unspecified site: Secondary | ICD-10-CM | POA: Diagnosis not present

## 2020-11-12 DIAGNOSIS — I5030 Unspecified diastolic (congestive) heart failure: Secondary | ICD-10-CM | POA: Diagnosis not present

## 2020-11-12 DIAGNOSIS — M5430 Sciatica, unspecified side: Secondary | ICD-10-CM | POA: Diagnosis not present

## 2020-11-12 DIAGNOSIS — M545 Low back pain, unspecified: Secondary | ICD-10-CM | POA: Diagnosis not present

## 2020-11-12 NOTE — Progress Notes (Signed)
PERIOPERATIVE PRESCRIPTION FOR IMPLANTED CARDIAC DEVICE PROGRAMMING  Patient Information: Name:  Summer Hawkins  DOB:  [birth year]  MRN:  [id number]    Planned Procedure:  Posterior Interbody Fusion  Surgeon:  Dr. Arnoldo Morale  Date of Procedure:  11/17/20  Cautery will be used.  Position during surgery:  Prone   Please send documentation back to:  [facility] (Fax # [phone])   Device Information:  Clinic EP Physician:  Cristopher Peru, MD   Device Type:  Pacemaker Manufacturer and Phone #:  Medtronic: [id number] Pacemaker Dependent?:  Yes.   Date of Last Device Check:  10/21/20 (Remote) Normal Device Function?:  Yes.    Electrophysiologist's Recommendations:  Have magnet available. Provide continuous ECG monitoring when magnet is used or reprogramming is to be performed.  Procedure will likely interfere with device function.  Device should be programmed:  Asynchronous pacing during procedure and returned to normal programming after procedure  Per Device Clinic Standing Orders, Simone Curia, RN  7:40 AM 11/12/2020

## 2020-11-16 ENCOUNTER — Other Ambulatory Visit: Payer: Self-pay

## 2020-11-16 ENCOUNTER — Encounter (HOSPITAL_COMMUNITY): Payer: Self-pay | Admitting: Neurosurgery

## 2020-11-16 NOTE — Anesthesia Preprocedure Evaluation (Addendum)
Anesthesia Evaluation  Patient identified by MRN, date of birth, ID band Patient awake    Reviewed: Allergy & Precautions, NPO status , Patient's Chart, lab work & pertinent test results  History of Anesthesia Complications Negative for: history of anesthetic complications  Airway Mallampati: III  TM Distance: >3 FB Neck ROM: Full    Dental  (+) Dental Advisory Given, Edentulous Upper, Edentulous Lower   Pulmonary shortness of breath, neg COPD, neg recent URI,    breath sounds clear to auscultation       Cardiovascular hypertension, Pt. on medications and Pt. on home beta blockers +CHF  + dysrhythmias Atrial Fibrillation + pacemaker  Rhythm:Regular  Left ventricle: The cavity size was normal. Wall thickness was  increased in a pattern of mild LVH. Systolic function was normal.  The estimated ejection fraction was in the range of 55% to 60%.  Wall motion was normal; there were no regional wall motion  abnormalities.  - Mitral valve: Mildly to moderately calcified annulus. Mildly  thickened leaflets .  - Left atrium: The atrium was mildly dilated.  - Pulmonary arteries: Systolic pressure was mildly increased. PA  peak pressure: 36 mm Hg (S).   medtronic pacer aoo   Neuro/Psych negative neurological ROS  negative psych ROS   GI/Hepatic negative GI ROS, Neg liver ROS,   Endo/Other  diabetes, Oral Hypoglycemic AgentsHypothyroidism   Renal/GU Renal InsufficiencyRenal diseaseLab Results      Component                Value               Date                      CREATININE               1.63 (H)            11/17/2020                Musculoskeletal negative musculoskeletal ROS (+)   Abdominal   Peds  Hematology Lab Results      Component                Value               Date                      WBC                      8.9                 11/17/2020                HGB                      12.6                 11/17/2020                HCT                      38.8                11/17/2020                MCV  97.2                11/17/2020                PLT                      289                 11/17/2020            eliquis   Anesthesia Other Findings   Reproductive/Obstetrics                            Anesthesia Physical Anesthesia Plan  ASA: 3  Anesthesia Plan: General   Post-op Pain Management:    Induction: Intravenous  PONV Risk Score and Plan: 3 and Ondansetron and Dexamethasone  Airway Management Planned: Oral ETT  Additional Equipment: None  Intra-op Plan:   Post-operative Plan: Extubation in OR  Informed Consent: I have reviewed the patients History and Physical, chart, labs and discussed the procedure including the risks, benefits and alternatives for the proposed anesthesia with the patient or authorized representative who has indicated his/her understanding and acceptance.     Dental advisory given  Plan Discussed with: CRNA and Anesthesiologist  Anesthesia Plan Comments: (See PAT note written 11/16/2020 by Myra Gianotti, PA-C. Same day work-up. Medtronic PPM. Has preoperative input from Dr. Angelena Form. For labs on arrival. )       Anesthesia Quick Evaluation

## 2020-11-16 NOTE — Progress Notes (Addendum)
Summer Hawkins the nurse returned call from [facility]. Preoop instructions given.   Last dose of Eliquis 11/11/20 per Nunzio Cory.  Nunzio Cory instructed not to give metformin day of surgery.

## 2020-11-16 NOTE — Progress Notes (Signed)
Surgical Instructions    Your procedure is scheduled on 11/17/2020.  Report to [facility] Main Entrance "A" at 1000 A.M., then check in with the Admitting office.  Call this number if you have problems the morning of surgery:  [phone]   If you have any questions prior to your surgery date call [phone]: Open Monday-Friday 8am-4pm    Remember:  Do not eat after midnight the night before your surgery    Take these medicines the morning of surgery with A SIP OF WATER  Amlodipine, Avapro, Arimidex, Synthroid, Metroprolol Succinate, Timolol (eye drops). Timoptic(eye drops), ultram.  Do not take Metformin day of surgery.  Insulin instructions: Insulin- Toujeo Solostar take 50 % dose at bedtime  Morning of surgery take 50% of dose.    As of today, STOP taking any Aspirin (unless otherwise instructed by your surgeon) Aleve, Naproxen, Ibuprofen, Motrin, Advil, Goody's, BC's, all herbal medications, fish oil, and all vitamins.          Do not wear jewelry or makeup Do not wear lotions, powders, perfumes/colognes, or deodorant. Do not shave 48 hours prior to surgery.  Men may shave face and neck. Do not bring valuables to the hospital. DO Not wear nail polish, gel polish, artificial nails, or any other type of covering on  natural nails including finger and toenails. If patients have artificial nails, gel coating, etc. that need to be removed by a nail salon please have this removed prior to surgery or surgery may need to be canceled/delayed if the surgeon/ anesthesia feels like the patient is unable to be adequately monitored.             [facility] is not responsible for any belongings or valuables.  Do NOT Smoke (Tobacco/Vaping)  24 hours prior to your procedure If you use a CPAP at night, you may bring all equipment for your overnight stay.   Contacts, glasses, dentures or bridgework may not be worn into surgery, please bring cases for these belongings   For patients  admitted to the hospital, discharge time will be determined by your treatment team.   Patients discharged the day of surgery will not be allowed to drive home, and someone needs to stay with them for 24 hours.  ONLY 1 SUPPORT PERSON MAY BE PRESENT WHILE YOU ARE IN SURGERY. IF YOU ARE TO BE ADMITTED ONCE YOU ARE IN YOUR ROOM YOU WILL BE ALLOWED TWO (2) VISITORS.  Minor children may have two parents present. Special consideration for safety and communication needs will be reviewed on a case by case basis.  Special instructions:    Oral Hygiene is also important to reduce your risk of infection.  Remember - BRUSH YOUR TEETH THE MORNING OF SURGERY WITH YOUR REG TOOTHPASTE

## 2020-11-16 NOTE — Progress Notes (Signed)
Tried reaching out to [employer] spoke with Elsie Lincoln on two different occassions once at 0800 and then again at 1600. She tried to reach the nurse Nunzio Cory unable to reach her. Freda Munro took my name number. I educated her on the importance that I speak to this nurse for this pt's preop instructions and when her last dose of eliquis was before her surgery on 11/17/2020.

## 2020-11-16 NOTE — Progress Notes (Signed)
Anesthesia Chart Review: SAME DAY WORK-UP  Case: [id number] Date/Time: 11/17/20 1240   Procedure: PLIF,IP,POSTERIOR INSTRUMENTATION L34, L45   Anesthesia type: General   Pre-op diagnosis: SPONDYLOLISTHESIS, LUMBAR REGION   Location: [facility] ROOM 21 / [facility]   Surgeons: Newman Pies, MD       DISCUSSION: Patient is an 86 year old female scheduled for the above procedure. She was recently admitted to [facility] at the request of her PCP, at least while awaiting surgery and recovery as her left hip and back pain were worsening and causing difficulty with ambulating or even standing at home.  History includes never smoker, HTN, HLD, afib (s/p DCCV 02/27/11; recurrent afib 06/2017), bradycardia/high grade AVB (s/p dual chamber Medtronic PPM 12/02/18), left breast cancer (s/p left breast lumpectomy 01/19/16, declined radiation; on anastrozole), DM2, dyspnea, hypothyroidism, CKD (stage III), cholecystectomy (2006).  Last visit with cardiologist Dr. Angelena Form 11/08/20. He wrote, "Pre-operative cardiovascular examination: Upcoming back surgery. She has no angina, evidence of CHF. Stable, rate controlled atrial fibrillation. OK to proceed with her back surgery. She will hold Eliquis 3 days before her surgery.   Perioperative PPM Rx: Device Information: Clinic EP Physician:  Cristopher Peru, MD  Device Type:  Pacemaker Manufacturer and Phone #:  Medtronic: [phone] Pacemaker Dependent?:  Yes.   Date of Last Device Check:  10/21/20 (Remote)       Normal Device Function?:  Yes.     Electrophysiologist's Recommendations: Have magnet available. Provide continuous ECG monitoring when magnet is used or reprogramming is to be performed.  Procedure will likely interfere with device function.  Device should be programmed:  Asynchronous pacing during procedure and returned to normal programming after procedure   She is a same day work-up given her recent admission to SNF. Last Creatinine 1.95 and K 4.9 on  11/01/20 (Cr range of 1.79-1.99 10/28/20-11/01/20). Known CKD history with range ~ 1.4-1.85 per [facility] labs since ~ 12/2017, although was Cf 1.4 on 04/01/20 per [facility] labs (Media tab). She does have labs within 30 days, but WBC and Creatinine were both more elevated since January with elevated K up to 6.2 (in the setting of spironolactone and ARB but also in the setting of slight hemolysis). She is getting a T&S per surgeon and will add CBC and BMET. Last A1c seen was 7.0% 04/01/20 ([facility]. Anesthesia team to evaluate on the day of surgery.     VS:  BP Readings from Last 3 Encounters:  11/08/20 110/70  11/01/20 121/64  09/16/20 114/68   Pulse Readings from Last 3 Encounters:  11/08/20 62  11/01/20 69  09/16/20 73     PROVIDERS: Tisovec, Fransico Him, MD is PCP at [facility])  Lauree Chandler, MD is cardiologist Cristopher Peru, MD is EP cardiologist Nicholas Lose, MD is HEM-ONC   LABS: Most recent lab results include: Lab Results  Component Value Date   WBC 14.0 (H) 10/28/2020   HGB 13.5 10/28/2020   HCT 41.8 10/28/2020   PLT 352 10/28/2020   GLUCOSE 223 (H) 11/01/2020   ALT 15 10/28/2020   AST 12 (L) 10/28/2020   NA 133 (L) 11/01/2020   K 4.9 11/01/2020   CL 99 11/01/2020   CREATININE 1.95 (H) 11/01/2020   BUN 38 (H) 11/01/2020   CO2 23 11/01/2020   TSH 0.781 11/21/2018   INR 1.4 (H) 11/21/2018   HGBA1C 6.9 (H) 11/21/2018     IMAGES: MRI left hip 10/21/20: IMPRESSION: 1. Severe/advanced degenerative changes involving the left  hip. 2. No stress fracture or AVN. 3. Mild bilateral peritendinitis without trochanteric bursitis.   MRI L-spine 10/05/20: IMPRESSION: 1. Lumbar spine degeneration especially affecting the L3-4 and L4-5 levels. 2. L3-4 high-grade spinal stenosis from hypertrophic facets and disc protrusion. 3. L4-5 moderate spinal stenosis with bilateral L5 impingement at the subarticular recesses.    EKG: 05/07/20: Afib at 87  bpm   CV: US Carotid 11/27/18: Summary:  - Right Carotid: Velocities in the right ICA are consistent with a 1-39% stenosis.  - Left Carotid: Velocities in the left ICA are consistent with a 1-39% stenosis.  - Vertebrals:  Bilateral vertebral arteries demonstrate antegrade flow.  - Subclavians: Normal flow hemodynamics were seen in bilateral subclavian arteries.    Echo 07/26/17: Study Conclusions  - Left ventricle: The cavity size was normal. Wall thickness was    increased in a pattern of mild LVH. Systolic function was normal.    The estimated ejection fraction was in the range of 55% to 60%.    Wall motion was normal; there were no regional wall motion    abnormalities.  - Mitral valve: Mildly to moderately calcified annulus. Mildly    thickened leaflets .  - Left atrium: The atrium was mildly dilated.  - Pulmonary arteries: Systolic pressure was mildly increased. PA    peak pressure: 36 mm Hg (S).    Nuclear stress test 01/05/11: Overall Impression:   Low risk study.  Small area of apical reversibility seen only on short axis and small area of midanterior wall reversibility seen only on vertical images.  This suggests artifact.  SDS 1 and normal wall motion   Past Medical History:  Diagnosis Date   Atrial fibrillation ([place])    Breast cancer ([place])    Breast cancer, left ([place])    Chronic kidney disease    stage III - patient was unaware   Diabetes mellitus    Dyspnea    Dysrhythmia    Afib   H/O: hysterectomy    History of colonoscopy 01/25/2010   History of mammogram 08/04/2009   Hyperlipidemia    Hypertension    Hypothyroidism    Ketoacidosis, diabetic, no coma, non-insulin dependent    Type II   Vitamin B12 deficiency     Past Surgical History:  Procedure Laterality Date   ABDOMINAL HYSTERECTOMY     Bilateral foot surgery     BREAST EXCISIONAL BIOPSY Left 12/2015   BREAST LUMPECTOMY Left    2017   BREAST LUMPECTOMY WITH RADIOACTIVE SEED AND SENTINEL  LYMPH NODE BIOPSY Left 01/19/2016   Procedure: LEFT BREAST LUMPECTOMY WITH RADIOACTIVE SEED AND SENTINEL LYMPH NODE BIOPSY;  Surgeon: Autumn Messing III, MD;  Location: [facility];  Service: General;  Laterality: Left;   BREAST LUMPECTOMY WITH RADIOACTIVE SEED LOCALIZATION Left 01/19/2016   CARDIOVERSION  02/27/2011   Procedure: CARDIOVERSION;  Surgeon: Loralie Champagne, MD;  Location: [facility];  Service: Cardiovascular;  Laterality: N/A;   LAPAROSCOPIC CHOLECYSTECTOMY     PACEMAKER IMPLANT N/A 12/02/2018   Procedure: PACEMAKER IMPLANT;  Surgeon: Evans Lance, MD;  Location: [place] CV LAB;  Service: Cardiovascular;  Laterality: N/A;   ROTATOR CUFF REPAIR Left    TUBAL LIGATION      MEDICATIONS: No current facility-administered medications for this encounter.    amLODipine (NORVASC) 5 MG tablet   anastrozole (ARIMIDEX) 1 MG tablet   apixaban (ELIQUIS) 5 MG TABS tablet   diphenhydramine-acetaminophen (TYLENOL PM) 25-500 MG TABS tablet   ferrous sulfate 325 (  65 FE) MG tablet   irbesartan (AVAPRO) 300 MG tablet   levothyroxine (SYNTHROID) 137 MCG tablet   metFORMIN (GLUCOPHAGE) 850 MG tablet   metoprolol succinate (TOPROL XL) 25 MG 24 hr tablet   Multiple Vitamins-Minerals (MULTIVITAMIN WITH MINERALS) tablet   spironolactone (ALDACTONE) 25 MG tablet   timolol (TIMOPTIC) 0.5 % ophthalmic solution   TOUJEO SOLOSTAR 300 UNIT/ML SOPN   traMADol (ULTRAM) 50 MG tablet    Myra Gianotti, PA-C Surgical Short Stay/Anesthesiology [facility] Phone [phone] [facility] Phone [phone] 11/16/2020 2:01 PM

## 2020-11-17 ENCOUNTER — Inpatient Hospital Stay (HOSPITAL_COMMUNITY)
Admission: RE | Admit: 2020-11-17 | Discharge: 2020-11-22 | DRG: 455 | Disposition: A | Discharge status: Skilled Nursing Facility | Payer: Medicare Other | Attending: Neurosurgery | Admitting: Neurosurgery

## 2020-11-17 ENCOUNTER — Inpatient Hospital Stay (HOSPITAL_COMMUNITY): Payer: Medicare Other

## 2020-11-17 ENCOUNTER — Encounter (HOSPITAL_COMMUNITY): Payer: Self-pay | Admitting: Neurosurgery

## 2020-11-17 ENCOUNTER — Inpatient Hospital Stay (HOSPITAL_COMMUNITY)
Admission: RE | Disposition: A | Discharge status: Skilled Nursing Facility | Payer: Self-pay | Source: Home / Self Care | Attending: Neurosurgery

## 2020-11-17 ENCOUNTER — Inpatient Hospital Stay (HOSPITAL_COMMUNITY): Payer: Medicare Other | Admitting: Vascular Surgery

## 2020-11-17 DIAGNOSIS — E1122 Type 2 diabetes mellitus with diabetic chronic kidney disease: Secondary | ICD-10-CM | POA: Diagnosis not present

## 2020-11-17 DIAGNOSIS — M4326 Fusion of spine, lumbar region: Secondary | ICD-10-CM | POA: Diagnosis not present

## 2020-11-17 DIAGNOSIS — Z833 Family history of diabetes mellitus: Secondary | ICD-10-CM | POA: Diagnosis not present

## 2020-11-17 DIAGNOSIS — Z20822 Contact with and (suspected) exposure to covid-19: Secondary | ICD-10-CM | POA: Diagnosis not present

## 2020-11-17 DIAGNOSIS — Z880 Allergy status to penicillin: Secondary | ICD-10-CM | POA: Diagnosis not present

## 2020-11-17 DIAGNOSIS — N189 Chronic kidney disease, unspecified: Secondary | ICD-10-CM | POA: Diagnosis present

## 2020-11-17 DIAGNOSIS — E039 Hypothyroidism, unspecified: Secondary | ICD-10-CM | POA: Diagnosis not present

## 2020-11-17 DIAGNOSIS — E538 Deficiency of other specified B group vitamins: Secondary | ICD-10-CM | POA: Diagnosis present

## 2020-11-17 DIAGNOSIS — Z7901 Long term (current) use of anticoagulants: Secondary | ICD-10-CM | POA: Diagnosis not present

## 2020-11-17 DIAGNOSIS — Z803 Family history of malignant neoplasm of breast: Secondary | ICD-10-CM

## 2020-11-17 DIAGNOSIS — Z90710 Acquired absence of both cervix and uterus: Secondary | ICD-10-CM | POA: Diagnosis not present

## 2020-11-17 DIAGNOSIS — R400 Somnolence: Secondary | ICD-10-CM | POA: Diagnosis not present

## 2020-11-17 DIAGNOSIS — Z7401 Bed confinement status: Secondary | ICD-10-CM | POA: Diagnosis not present

## 2020-11-17 DIAGNOSIS — M48061 Spinal stenosis, lumbar region without neurogenic claudication: Secondary | ICD-10-CM | POA: Diagnosis not present

## 2020-11-17 DIAGNOSIS — Z741 Need for assistance with personal care: Secondary | ICD-10-CM | POA: Diagnosis not present

## 2020-11-17 DIAGNOSIS — R278 Other lack of coordination: Secondary | ICD-10-CM | POA: Diagnosis not present

## 2020-11-17 DIAGNOSIS — I5032 Chronic diastolic (congestive) heart failure: Secondary | ICD-10-CM | POA: Diagnosis not present

## 2020-11-17 DIAGNOSIS — Z419 Encounter for procedure for purposes other than remedying health state, unspecified: Secondary | ICD-10-CM

## 2020-11-17 DIAGNOSIS — Z79899 Other long term (current) drug therapy: Secondary | ICD-10-CM

## 2020-11-17 DIAGNOSIS — Z79890 Hormone replacement therapy (postmenopausal): Secondary | ICD-10-CM | POA: Diagnosis not present

## 2020-11-17 DIAGNOSIS — Z888 Allergy status to other drugs, medicaments and biological substances status: Secondary | ICD-10-CM | POA: Diagnosis not present

## 2020-11-17 DIAGNOSIS — E113393 Type 2 diabetes mellitus with moderate nonproliferative diabetic retinopathy without macular edema, bilateral: Secondary | ICD-10-CM | POA: Diagnosis not present

## 2020-11-17 DIAGNOSIS — I4891 Unspecified atrial fibrillation: Secondary | ICD-10-CM | POA: Diagnosis not present

## 2020-11-17 DIAGNOSIS — M5116 Intervertebral disc disorders with radiculopathy, lumbar region: Secondary | ICD-10-CM | POA: Diagnosis present

## 2020-11-17 DIAGNOSIS — M48062 Spinal stenosis, lumbar region with neurogenic claudication: Secondary | ICD-10-CM | POA: Diagnosis not present

## 2020-11-17 DIAGNOSIS — E785 Hyperlipidemia, unspecified: Secondary | ICD-10-CM | POA: Diagnosis present

## 2020-11-17 DIAGNOSIS — R531 Weakness: Secondary | ICD-10-CM | POA: Diagnosis not present

## 2020-11-17 DIAGNOSIS — Z8249 Family history of ischemic heart disease and other diseases of the circulatory system: Secondary | ICD-10-CM

## 2020-11-17 DIAGNOSIS — Z91030 Bee allergy status: Secondary | ICD-10-CM | POA: Diagnosis not present

## 2020-11-17 DIAGNOSIS — Z853 Personal history of malignant neoplasm of breast: Secondary | ICD-10-CM

## 2020-11-17 DIAGNOSIS — I129 Hypertensive chronic kidney disease with stage 1 through stage 4 chronic kidney disease, or unspecified chronic kidney disease: Secondary | ICD-10-CM | POA: Diagnosis not present

## 2020-11-17 DIAGNOSIS — M6281 Muscle weakness (generalized): Secondary | ICD-10-CM | POA: Diagnosis not present

## 2020-11-17 DIAGNOSIS — I482 Chronic atrial fibrillation, unspecified: Secondary | ICD-10-CM | POA: Diagnosis not present

## 2020-11-17 DIAGNOSIS — C50412 Malignant neoplasm of upper-outer quadrant of left female breast: Secondary | ICD-10-CM | POA: Diagnosis not present

## 2020-11-17 DIAGNOSIS — Z7984 Long term (current) use of oral hypoglycemic drugs: Secondary | ICD-10-CM | POA: Diagnosis not present

## 2020-11-17 DIAGNOSIS — M4316 Spondylolisthesis, lumbar region: Principal | ICD-10-CM | POA: Diagnosis present

## 2020-11-17 DIAGNOSIS — R262 Difficulty in walking, not elsewhere classified: Secondary | ICD-10-CM | POA: Diagnosis not present

## 2020-11-17 DIAGNOSIS — R2681 Unsteadiness on feet: Secondary | ICD-10-CM | POA: Diagnosis not present

## 2020-11-17 DIAGNOSIS — I10 Essential (primary) hypertension: Secondary | ICD-10-CM | POA: Diagnosis not present

## 2020-11-17 DIAGNOSIS — Z950 Presence of cardiac pacemaker: Secondary | ICD-10-CM | POA: Diagnosis not present

## 2020-11-17 DIAGNOSIS — Z981 Arthrodesis status: Secondary | ICD-10-CM | POA: Diagnosis not present

## 2020-11-17 LAB — TYPE AND SCREEN
ABO/RH(D): O POS
Antibody Screen: NEGATIVE

## 2020-11-17 LAB — ABO/RH: ABO/RH(D): O POS

## 2020-11-17 LAB — BASIC METABOLIC PANEL
Anion gap: 10 (ref 5–15)
BUN: 25 mg/dL — ABNORMAL HIGH (ref 8–23)
CO2: 19 mmol/L — ABNORMAL LOW (ref 22–32)
Calcium: 9.5 mg/dL (ref 8.9–10.3)
Chloride: 107 mmol/L (ref 98–111)
Creatinine, Ser: 1.63 mg/dL — ABNORMAL HIGH (ref 0.44–1.00)
GFR, Estimated: 31 mL/min — ABNORMAL LOW (ref >60)
Glucose, Bld: 182 mg/dL — ABNORMAL HIGH (ref 70–99)
Potassium: 3.8 mmol/L (ref 3.5–5.1)
Sodium: 136 mmol/L (ref 135–145)

## 2020-11-17 LAB — CBC
HCT: 38.8 % (ref 36.0–46.0)
Hemoglobin: 12.6 g/dL (ref 12.0–15.0)
MCH: 31.6 pg (ref 26.0–34.0)
MCHC: 32.5 g/dL (ref 30.0–36.0)
MCV: 97.2 fL (ref 80.0–100.0)
Platelets: 289 10*3/uL (ref 150–400)
RBC: 3.99 MIL/uL (ref 3.87–5.11)
RDW: 13.6 % (ref 11.5–15.5)
WBC: 8.9 10*3/uL (ref 4.0–10.5)
nRBC: 0 % (ref 0.0–0.2)

## 2020-11-17 LAB — GLUCOSE, CAPILLARY
Glucose-Capillary: 166 mg/dL — ABNORMAL HIGH (ref 70–99)
Glucose-Capillary: 164 mg/dL — ABNORMAL HIGH (ref 70–99)
Glucose-Capillary: 155 mg/dL — ABNORMAL HIGH (ref 70–99)
Glucose-Capillary: 310 mg/dL — ABNORMAL HIGH (ref 70–99)

## 2020-11-17 LAB — SURGICAL PCR SCREEN
MRSA, PCR: NEGATIVE
Staphylococcus aureus: NEGATIVE

## 2020-11-17 LAB — SARS CORONAVIRUS 2 BY RT PCR (HOSPITAL ORDER, PERFORMED IN ~~LOC~~ HOSPITAL LAB): SARS Coronavirus 2: NEGATIVE

## 2020-11-17 SURGERY — POSTERIOR LUMBAR FUSION 2 LEVEL
Anesthesia: General

## 2020-11-17 MED ORDER — METFORMIN HCL 850 MG PO TABS
850.0000 mg | ORAL_TABLET | Freq: Two times a day (BID) | ORAL | Status: DC
  Administered 2020-11-18 – 2020-11-22 (×10): 850 mg via ORAL
  Filled 2020-11-17 (×12): qty 1

## 2020-11-17 MED ORDER — ONDANSETRON HCL 4 MG/2ML IJ SOLN
INTRAMUSCULAR | Status: DC | PRN
  Administered 2020-11-17: 4 mg via INTRAVENOUS

## 2020-11-17 MED ORDER — ACETAMINOPHEN 10 MG/ML IV SOLN
INTRAVENOUS | Status: DC | PRN
  Administered 2020-11-17: 1000 mg via INTRAVENOUS

## 2020-11-17 MED ORDER — OXYCODONE HCL 5 MG/5ML PO SOLN: 5.0000 mg | Freq: Once | ORAL | Status: DC | PRN

## 2020-11-17 MED ORDER — VANCOMYCIN HCL IN DEXTROSE 1-5 GM/200ML-% IV SOLN
1000.0000 mg | Freq: Once | INTRAVENOUS | Status: AC
  Administered 2020-11-17: 1000 mg via INTRAVENOUS
  Filled 2020-11-17: qty 200

## 2020-11-17 MED ORDER — ADULT MULTIVITAMIN W/MINERALS CH
1.0000 | ORAL_TABLET | Freq: Every day | ORAL | Status: DC
  Administered 2020-11-17 – 2020-11-22 (×6): 1 via ORAL
  Filled 2020-11-17 (×6): qty 1

## 2020-11-17 MED ORDER — CHLORHEXIDINE GLUCONATE CLOTH 2 % EX PADS: 6.0000 | MEDICATED_PAD | Freq: Once | CUTANEOUS | Status: DC

## 2020-11-17 MED ORDER — PHENYLEPHRINE HCL-NACL 20-0.9 MG/250ML-% IV SOLN
INTRAVENOUS | Status: DC | PRN
  Administered 2020-11-17: 25 ug/min via INTRAVENOUS

## 2020-11-17 MED ORDER — ACETAMINOPHEN 500 MG PO TABS
1000.0000 mg | ORAL_TABLET | Freq: Four times a day (QID) | ORAL | Status: AC
  Administered 2020-11-17 – 2020-11-18 (×3): 1000 mg via ORAL
  Filled 2020-11-17 (×4): qty 2

## 2020-11-17 MED ORDER — HEMOSTATIC AGENTS (NO CHARGE) OPTIME
TOPICAL | Status: DC | PRN
  Administered 2020-11-17: 1 via TOPICAL

## 2020-11-17 MED ORDER — BUPIVACAINE LIPOSOME 1.3 % IJ SUSP
INTRAMUSCULAR | Status: DC | PRN
  Administered 2020-11-17: 20 mL

## 2020-11-17 MED ORDER — FENTANYL CITRATE (PF) 100 MCG/2ML IJ SOLN
INTRAMUSCULAR | Status: DC | PRN
  Administered 2020-11-17: 100 ug via INTRAVENOUS

## 2020-11-17 MED ORDER — BISACODYL 10 MG RE SUPP: 10.0000 mg | Freq: Every day | RECTAL | Status: DC | PRN

## 2020-11-17 MED ORDER — MENTHOL 3 MG MT LOZG: 1.0000 | LOZENGE | OROMUCOSAL | Status: DC | PRN

## 2020-11-17 MED ORDER — ACETAMINOPHEN 10 MG/ML IV SOLN: 1000.0000 mg | Freq: Once | INTRAVENOUS | Status: DC | PRN

## 2020-11-17 MED ORDER — ONDANSETRON HCL 4 MG/2ML IJ SOLN: 4.0000 mg | Freq: Four times a day (QID) | INTRAMUSCULAR | Status: DC | PRN

## 2020-11-17 MED ORDER — 0.9 % SODIUM CHLORIDE (POUR BTL) OPTIME
TOPICAL | Status: DC | PRN
  Administered 2020-11-17: 1000 mL

## 2020-11-17 MED ORDER — LACTATED RINGERS IV SOLN: INTRAVENOUS | Status: DC

## 2020-11-17 MED ORDER — TIMOLOL MALEATE 0.5 % OP SOLN
1.0000 [drp] | Freq: Two times a day (BID) | OPHTHALMIC | Status: DC
  Administered 2020-11-17 – 2020-11-22 (×10): 1 [drp] via OPHTHALMIC
  Filled 2020-11-17 (×5): qty 5

## 2020-11-17 MED ORDER — ZOLPIDEM TARTRATE 5 MG PO TABS
5.0000 mg | ORAL_TABLET | Freq: Every evening | ORAL | Status: DC | PRN
  Administered 2020-11-20: 5 mg via ORAL
  Filled 2020-11-17: qty 1

## 2020-11-17 MED ORDER — OXYCODONE HCL 5 MG PO TABS: 5.0000 mg | ORAL_TABLET | Freq: Once | ORAL | Status: DC | PRN

## 2020-11-17 MED ORDER — FERROUS SULFATE 325 (65 FE) MG PO TABS
325.0000 mg | ORAL_TABLET | Freq: Every day | ORAL | Status: DC
  Administered 2020-11-18 – 2020-11-22 (×5): 325 mg via ORAL
  Filled 2020-11-17 (×5): qty 1

## 2020-11-17 MED ORDER — ORAL CARE MOUTH RINSE: 15.0000 mL | Freq: Once | OROMUCOSAL | Status: AC

## 2020-11-17 MED ORDER — FENTANYL CITRATE (PF) 250 MCG/5ML IJ SOLN
INTRAMUSCULAR | Status: AC
  Filled 2020-11-17: qty 5

## 2020-11-17 MED ORDER — FENTANYL CITRATE (PF) 100 MCG/2ML IJ SOLN
25.0000 ug | INTRAMUSCULAR | Status: DC | PRN
  Administered 2020-11-17 (×2): 25 ug via INTRAVENOUS

## 2020-11-17 MED ORDER — DIPHENHYDRAMINE-APAP (SLEEP) 25-500 MG PO TABS: 1.0000 | ORAL_TABLET | Freq: Every evening | ORAL | Status: DC | PRN

## 2020-11-17 MED ORDER — SODIUM CHLORIDE 0.9% FLUSH: 3.0000 mL | INTRAVENOUS | Status: DC | PRN

## 2020-11-17 MED ORDER — ACETAMINOPHEN 650 MG RE SUPP: 650.0000 mg | RECTAL | Status: DC | PRN

## 2020-11-17 MED ORDER — PROPOFOL 10 MG/ML IV BOLUS
INTRAVENOUS | Status: DC | PRN
  Administered 2020-11-17: 50 mg via INTRAVENOUS

## 2020-11-17 MED ORDER — IRBESARTAN 300 MG PO TABS
300.0000 mg | ORAL_TABLET | Freq: Every day | ORAL | Status: DC
  Administered 2020-11-18 – 2020-11-22 (×5): 300 mg via ORAL
  Filled 2020-11-17 (×3): qty 1
  Filled 2020-11-17: qty 2
  Filled 2020-11-17: qty 1

## 2020-11-17 MED ORDER — ACETAMINOPHEN 325 MG PO TABS
650.0000 mg | ORAL_TABLET | ORAL | Status: DC | PRN
  Administered 2020-11-22 (×2): 650 mg via ORAL
  Filled 2020-11-17 (×2): qty 2

## 2020-11-17 MED ORDER — SODIUM CHLORIDE 0.9% FLUSH
3.0000 mL | Freq: Two times a day (BID) | INTRAVENOUS | Status: DC
  Administered 2020-11-18 – 2020-11-22 (×9): 3 mL via INTRAVENOUS

## 2020-11-17 MED ORDER — BACITRACIN ZINC 500 UNIT/GM EX OINT
TOPICAL_OINTMENT | CUTANEOUS | Status: AC
  Filled 2020-11-17: qty 28.35

## 2020-11-17 MED ORDER — VANCOMYCIN HCL IN DEXTROSE 1-5 GM/200ML-% IV SOLN
1000.0000 mg | INTRAVENOUS | Status: AC
  Administered 2020-11-17: 1000 mg via INTRAVENOUS
  Filled 2020-11-17: qty 200

## 2020-11-17 MED ORDER — ANASTROZOLE 1 MG PO TABS
1.0000 mg | ORAL_TABLET | Freq: Every day | ORAL | Status: DC
  Administered 2020-11-18 – 2020-11-22 (×5): 1 mg via ORAL
  Filled 2020-11-17 (×5): qty 1

## 2020-11-17 MED ORDER — DIPHENHYDRAMINE HCL 25 MG PO CAPS
25.0000 mg | ORAL_CAPSULE | Freq: Every evening | ORAL | Status: DC | PRN
  Administered 2020-11-18: 25 mg via ORAL
  Filled 2020-11-17: qty 1

## 2020-11-17 MED ORDER — LEVOTHYROXINE SODIUM 25 MCG PO TABS
137.0000 ug | ORAL_TABLET | Freq: Every day | ORAL | Status: DC
  Administered 2020-11-18 – 2020-11-22 (×5): 137 ug via ORAL
  Filled 2020-11-17 (×8): qty 1

## 2020-11-17 MED ORDER — ONDANSETRON HCL 4 MG PO TABS: 4.0000 mg | ORAL_TABLET | Freq: Four times a day (QID) | ORAL | Status: DC | PRN

## 2020-11-17 MED ORDER — THROMBIN 20000 UNITS EX SOLR
CUTANEOUS | Status: DC | PRN
  Administered 2020-11-17: 20 mL via TOPICAL

## 2020-11-17 MED ORDER — OXYCODONE HCL 5 MG PO TABS
10.0000 mg | ORAL_TABLET | ORAL | Status: DC | PRN
Start: 2020-11-17 — End: 2020-11-22
  Administered 2020-11-22 (×2): 10 mg via ORAL
  Filled 2020-11-17 (×2): qty 2

## 2020-11-17 MED ORDER — ACETAMINOPHEN 500 MG PO TABS: 1000.0000 mg | ORAL_TABLET | Freq: Once | ORAL | Status: DC | PRN

## 2020-11-17 MED ORDER — AMLODIPINE BESYLATE 5 MG PO TABS
5.0000 mg | ORAL_TABLET | Freq: Every day | ORAL | Status: DC
  Administered 2020-11-18 – 2020-11-22 (×5): 5 mg via ORAL
  Filled 2020-11-17 (×5): qty 1

## 2020-11-17 MED ORDER — DOCUSATE SODIUM 100 MG PO CAPS
100.0000 mg | ORAL_CAPSULE | Freq: Two times a day (BID) | ORAL | Status: DC
  Administered 2020-11-17 – 2020-11-22 (×10): 100 mg via ORAL
  Filled 2020-11-17 (×10): qty 1

## 2020-11-17 MED ORDER — CHLORHEXIDINE GLUCONATE 0.12 % MT SOLN
15.0000 mL | Freq: Once | OROMUCOSAL | Status: AC
  Administered 2020-11-17: 15 mL via OROMUCOSAL
  Filled 2020-11-17: qty 15

## 2020-11-17 MED ORDER — DEXAMETHASONE SODIUM PHOSPHATE 10 MG/ML IJ SOLN
INTRAMUSCULAR | Status: DC | PRN
  Administered 2020-11-17: 10 mg via INTRAVENOUS

## 2020-11-17 MED ORDER — ACETAMINOPHEN 500 MG PO TABS
500.0000 mg | ORAL_TABLET | Freq: Every evening | ORAL | Status: DC | PRN
  Administered 2020-11-18: 500 mg via ORAL
  Filled 2020-11-17: qty 1

## 2020-11-17 MED ORDER — MORPHINE SULFATE (PF) 4 MG/ML IV SOLN
4.0000 mg | INTRAVENOUS | Status: DC | PRN
Start: 2020-11-17 — End: 2020-11-22
  Administered 2020-11-19 (×2): 4 mg via INTRAVENOUS
  Filled 2020-11-17 (×2): qty 1

## 2020-11-17 MED ORDER — THROMBIN 20000 UNITS EX SOLR
CUTANEOUS | Status: AC
  Filled 2020-11-17: qty 20000

## 2020-11-17 MED ORDER — FENTANYL CITRATE (PF) 100 MCG/2ML IJ SOLN
INTRAMUSCULAR | Status: AC
  Filled 2020-11-17: qty 2

## 2020-11-17 MED ORDER — THROMBIN 5000 UNITS EX SOLR
CUTANEOUS | Status: AC
  Filled 2020-11-17: qty 5000

## 2020-11-17 MED ORDER — OXYCODONE HCL 5 MG PO TABS
5.0000 mg | ORAL_TABLET | ORAL | Status: DC | PRN
  Administered 2020-11-18 – 2020-11-21 (×6): 5 mg via ORAL
  Filled 2020-11-17 (×6): qty 1

## 2020-11-17 MED ORDER — BUPIVACAINE-EPINEPHRINE 0.5% -1:200000 IJ SOLN
INTRAMUSCULAR | Status: AC
  Filled 2020-11-17: qty 1

## 2020-11-17 MED ORDER — ACETAMINOPHEN 160 MG/5ML PO SOLN: 1000.0000 mg | Freq: Once | ORAL | Status: DC | PRN

## 2020-11-17 MED ORDER — SPIRONOLACTONE 25 MG PO TABS
25.0000 mg | ORAL_TABLET | Freq: Every day | ORAL | Status: DC
  Administered 2020-11-18 – 2020-11-22 (×5): 25 mg via ORAL
  Filled 2020-11-17 (×5): qty 1

## 2020-11-17 MED ORDER — INSULIN ASPART 100 UNIT/ML IJ SOLN: 0.0000 [IU] | INTRAMUSCULAR | Status: DC

## 2020-11-17 MED ORDER — PHENOL 1.4 % MT LIQD: 1.0000 | OROMUCOSAL | Status: DC | PRN

## 2020-11-17 MED ORDER — BUPIVACAINE LIPOSOME 1.3 % IJ SUSP
INTRAMUSCULAR | Status: AC
  Filled 2020-11-17: qty 20

## 2020-11-17 MED ORDER — INSULIN ASPART 100 UNIT/ML IJ SOLN
0.0000 [IU] | Freq: Three times a day (TID) | INTRAMUSCULAR | Status: DC
  Administered 2020-11-18 (×2): 7 [IU] via SUBCUTANEOUS
  Administered 2020-11-18: 11 [IU] via SUBCUTANEOUS
  Administered 2020-11-20: 3 [IU] via SUBCUTANEOUS
  Administered 2020-11-20 – 2020-11-21 (×3): 4 [IU] via SUBCUTANEOUS
  Administered 2020-11-21 (×2): 3 [IU] via SUBCUTANEOUS
  Administered 2020-11-22 (×2): 4 [IU] via SUBCUTANEOUS

## 2020-11-17 MED ORDER — METOPROLOL SUCCINATE ER 25 MG PO TB24
25.0000 mg | ORAL_TABLET | Freq: Every day | ORAL | Status: DC
  Administered 2020-11-18 – 2020-11-22 (×5): 25 mg via ORAL
  Filled 2020-11-17 (×5): qty 1

## 2020-11-17 MED ORDER — TRAMADOL HCL 50 MG PO TABS
50.0000 mg | ORAL_TABLET | Freq: Two times a day (BID) | ORAL | Status: DC
  Administered 2020-11-17 – 2020-11-22 (×10): 50 mg via ORAL
  Filled 2020-11-17 (×10): qty 1

## 2020-11-17 MED ORDER — INSULIN ASPART 100 UNIT/ML IJ SOLN
0.0000 [IU] | Freq: Every day | INTRAMUSCULAR | Status: DC
  Administered 2020-11-17: 4 [IU] via SUBCUTANEOUS

## 2020-11-17 MED ORDER — ROCURONIUM BROMIDE 10 MG/ML (PF) SYRINGE
PREFILLED_SYRINGE | INTRAVENOUS | Status: DC | PRN
Start: 2020-11-17 — End: 2020-11-17
  Administered 2020-11-17: 60 mg via INTRAVENOUS
  Administered 2020-11-17: 10 mg via INTRAVENOUS
  Administered 2020-11-17: 40 mg via INTRAVENOUS
  Administered 2020-11-17: 20 mg via INTRAVENOUS

## 2020-11-17 MED ORDER — LIDOCAINE 2% (20 MG/ML) 5 ML SYRINGE
INTRAMUSCULAR | Status: DC | PRN
  Administered 2020-11-17: 100 mg via INTRAVENOUS

## 2020-11-17 MED ORDER — THROMBIN 5000 UNITS EX SOLR
OROMUCOSAL | Status: DC | PRN
  Administered 2020-11-17 (×2): 5 mL via TOPICAL

## 2020-11-17 MED ORDER — SODIUM CHLORIDE 0.9 % IV SOLN: 250.0000 mL | INTRAVENOUS | Status: DC

## 2020-11-17 MED ORDER — CYCLOBENZAPRINE HCL 10 MG PO TABS
10.0000 mg | ORAL_TABLET | Freq: Three times a day (TID) | ORAL | Status: DC | PRN
  Administered 2020-11-18 – 2020-11-21 (×3): 10 mg via ORAL
  Filled 2020-11-17 (×3): qty 1

## 2020-11-17 SURGICAL SUPPLY — 72 items
APL SKNCLS STERI-STRIP NONHPOA (GAUZE/BANDAGES/DRESSINGS) ×1
BAG COUNTER SPONGE SURGICOUNT (BAG) ×2 IMPLANT
BAG SPNG CNTER NS LX DISP (BAG) ×1
BASKET BONE COLLECTION (BASKET) ×2 IMPLANT
BENZOIN TINCTURE PRP APPL 2/3 (GAUZE/BANDAGES/DRESSINGS) ×2 IMPLANT
BLADE CLIPPER SURG (BLADE) IMPLANT
BUR MATCHSTICK NEURO 3.0 LAGG (BURR) ×2 IMPLANT
BUR PRECISION FLUTE 6.0 (BURR) ×2 IMPLANT
CAGE ALTERA 10X31X9-13 15D (Cage) ×2 IMPLANT
CANISTER SUCT 3000ML PPV (MISCELLANEOUS) ×2 IMPLANT
CAP LOCK DLX THRD (Cap) ×6 IMPLANT
CARTRIDGE OIL MAESTRO DRILL (MISCELLANEOUS) ×1 IMPLANT
CLSR STERI-STRIP ANTIMIC 1/2X4 (GAUZE/BANDAGES/DRESSINGS) ×1 IMPLANT
CNTNR URN SCR LID CUP LEK RST (MISCELLANEOUS) ×1 IMPLANT
CONT SPEC 4OZ STRL OR WHT (MISCELLANEOUS) ×2
COVER BACK TABLE 60X90IN (DRAPES) ×2 IMPLANT
DECANTER SPIKE VIAL GLASS SM (MISCELLANEOUS) ×2 IMPLANT
DIFFUSER DRILL AIR PNEUMATIC (MISCELLANEOUS) ×2 IMPLANT
DRAPE C-ARM 42X72 X-RAY (DRAPES) ×3 IMPLANT
DRAPE HALF SHEET 40X57 (DRAPES) ×3 IMPLANT
DRAPE LAPAROTOMY 100X72X124 (DRAPES) ×2 IMPLANT
DRAPE SURG 17X23 STRL (DRAPES) ×8 IMPLANT
DRSG OPSITE POSTOP 4X6 (GAUZE/BANDAGES/DRESSINGS) ×2 IMPLANT
DRSG OPSITE POSTOP 4X8 (GAUZE/BANDAGES/DRESSINGS) ×1 IMPLANT
ELECT BLADE 4.0 EZ CLEAN MEGAD (MISCELLANEOUS) ×2
ELECT REM PT RETURN 9FT ADLT (ELECTROSURGICAL) ×2
ELECTRODE BLDE 4.0 EZ CLN MEGD (MISCELLANEOUS) ×1 IMPLANT
ELECTRODE REM PT RTRN 9FT ADLT (ELECTROSURGICAL) ×1 IMPLANT
GAUZE 4X4 16PLY ~~LOC~~+RFID DBL (SPONGE) ×2 IMPLANT
GLOVE EXAM NITRILE XL STR (GLOVE) IMPLANT
GLOVE SURG ENC MOIS LTX SZ6.5 (GLOVE) ×3 IMPLANT
GLOVE SURG ENC MOIS LTX SZ7 (GLOVE) ×4 IMPLANT
GLOVE SURG ENC MOIS LTX SZ8 (GLOVE) ×4 IMPLANT
GLOVE SURG ENC MOIS LTX SZ8.5 (GLOVE) ×4 IMPLANT
GLOVE SURG POLYISO LF SZ7 (GLOVE) ×2 IMPLANT
GLOVE SURG UNDER POLY LF SZ6.5 (GLOVE) ×1 IMPLANT
GOWN STRL REUS W/ TWL LRG LVL3 (GOWN DISPOSABLE) IMPLANT
GOWN STRL REUS W/ TWL XL LVL3 (GOWN DISPOSABLE) ×2 IMPLANT
GOWN STRL REUS W/TWL 2XL LVL3 (GOWN DISPOSABLE) IMPLANT
GOWN STRL REUS W/TWL LRG LVL3 (GOWN DISPOSABLE) ×4
GOWN STRL REUS W/TWL XL LVL3 (GOWN DISPOSABLE) ×4
GRAFT BONE PROTEIOS MED 2.5CC (Orthopedic Implant) ×2 IMPLANT
HEMOSTAT POWDER KIT SURGIFOAM (HEMOSTASIS) ×3 IMPLANT
KIT BASIN OR (CUSTOM PROCEDURE TRAY) ×2 IMPLANT
KIT TURNOVER KIT B (KITS) ×2 IMPLANT
MILL MEDIUM DISP (BLADE) ×2 IMPLANT
NDL HYPO 21X1.5 SAFETY (NEEDLE) IMPLANT
NEEDLE HYPO 21X1.5 SAFETY (NEEDLE) IMPLANT
NEEDLE HYPO 22GX1.5 SAFETY (NEEDLE) ×2 IMPLANT
NS IRRIG 1000ML POUR BTL (IV SOLUTION) ×2 IMPLANT
OIL CARTRIDGE MAESTRO DRILL (MISCELLANEOUS) ×2
PACK LAMINECTOMY NEURO (CUSTOM PROCEDURE TRAY) ×2 IMPLANT
PAD ARMBOARD 7.5X6 YLW CONV (MISCELLANEOUS) ×6 IMPLANT
PATTIES SURGICAL .5 X1 (DISPOSABLE) IMPLANT
PUTTY DBM 10CC CALC GRAN (Putty) ×1 IMPLANT
PUTTY DBM 5CC CALC GRAN (Putty) ×2 IMPLANT
ROD CURVED TI 6.35X75 (Rod) ×2 IMPLANT
SCREW PA DLX CREO 7.5X45 (Screw) ×1 IMPLANT
SCREW PA DLX CREO 7.5X50 (Screw) ×5 IMPLANT
SPONGE NEURO XRAY DETECT 1X3 (DISPOSABLE) IMPLANT
SPONGE SURGIFOAM ABS GEL 100 (HEMOSTASIS) IMPLANT
SPONGE SURGIFOAM ABS GEL SZ50 (HEMOSTASIS) ×1 IMPLANT
SPONGE T-LAP 4X18 ~~LOC~~+RFID (SPONGE) ×3 IMPLANT
STRIP CLOSURE SKIN 1/2X4 (GAUZE/BANDAGES/DRESSINGS) ×2 IMPLANT
SUT VIC AB 1 CT1 18XBRD ANBCTR (SUTURE) ×2 IMPLANT
SUT VIC AB 1 CT1 8-18 (SUTURE) ×4
SUT VIC AB 2-0 CP2 18 (SUTURE) ×4 IMPLANT
SYR 20ML LL LF (SYRINGE) IMPLANT
TOWEL GREEN STERILE (TOWEL DISPOSABLE) ×2 IMPLANT
TOWEL GREEN STERILE FF (TOWEL DISPOSABLE) ×2 IMPLANT
TRAY FOLEY MTR SLVR 16FR STAT (SET/KITS/TRAYS/PACK) ×2 IMPLANT
WATER STERILE IRR 1000ML POUR (IV SOLUTION) ×2 IMPLANT

## 2020-11-17 NOTE — H&P (Signed)
Subjective: The patient is an 86 year old white female who is complained of back and leg pain consistent with neurogenic claudication.  She has failed medical management and was worked up with lumbar x-rays and lumbar MRI which demonstrated an L3-4 and L4-5 spondylolisthesis and spinal stenosis.  I discussed the various treatment options with her.  She has decided proceed with surgery.  Past Medical History:  Diagnosis Date   Atrial fibrillation ([place])    Breast cancer ([place])    Breast cancer, left ([place])    Chronic kidney disease    stage III - patient was unaware   Diabetes mellitus    Dyspnea    Dysrhythmia    Afib   H/O: hysterectomy    History of colonoscopy 01/25/2010   History of mammogram 08/04/2009   Hyperlipidemia    Hypertension    Hypothyroidism    Ketoacidosis, diabetic, no coma, non-insulin dependent    Type II   Vitamin B12 deficiency     Past Surgical History:  Procedure Laterality Date   ABDOMINAL HYSTERECTOMY     Bilateral foot surgery     BREAST EXCISIONAL BIOPSY Left 12/2015   BREAST LUMPECTOMY Left    2017   BREAST LUMPECTOMY WITH RADIOACTIVE SEED AND SENTINEL LYMPH NODE BIOPSY Left 01/19/2016   Procedure: LEFT BREAST LUMPECTOMY WITH RADIOACTIVE SEED AND SENTINEL LYMPH NODE BIOPSY;  Surgeon: Autumn Messing III, MD;  Location: [facility];  Service: General;  Laterality: Left;   BREAST LUMPECTOMY WITH RADIOACTIVE SEED LOCALIZATION Left 01/19/2016   CARDIOVERSION  02/27/2011   Procedure: CARDIOVERSION;  Surgeon: Loralie Champagne, MD;  Location: [facility];  Service: Cardiovascular;  Laterality: N/A;   LAPAROSCOPIC CHOLECYSTECTOMY     PACEMAKER IMPLANT N/A 12/02/2018   Procedure: PACEMAKER IMPLANT;  Surgeon: Evans Lance, MD;  Location: [facility] [facility];  Service: Cardiovascular;  Laterality: N/A;   ROTATOR CUFF REPAIR Left    TUBAL LIGATION      Allergies  Allergen Reactions   Bee Venom Shortness Of Breath, Nausea And Vomiting and Other (See Comments)    Makes the patient  feel faint, also   Penicillins Anaphylaxis, Hives, Swelling and Other (See Comments)    Has patient had a PCN reaction causing immediate rash, facial/tongue/throat swelling, SOB or lightheadedness with hypotension: Yes Has patient had a PCN reaction causing severe rash involving mucus membranes or skin necrosis: No Has patient had a PCN reaction that required hospitalization: No Has patient had a PCN reaction occurring within the last 10 years: No If all of the above answers are "NO", then may proceed with Cephalosporin use.    Liraglutide     Other reaction(s): Rash at injection site   Lisinopril     Other reaction(s): cough   Zocor [Simvastatin]     Other reaction(s): memory changes    Social History   Tobacco Use   Smoking status: Never   Smokeless tobacco: Never  Substance Use Topics   Alcohol use: No    Alcohol/week: 0.0 standard drinks    Family History  Problem Relation Age of Onset   Heart failure Father 2       enlarged heart   Pneumonia Mother 49   Diabetes Mother 33   Breast cancer Sister    Cancer Neg Hx    Prior to Admission medications   Medication Sig Start Date End Date Taking? Authorizing Provider  amLODipine (NORVASC) 5 MG tablet Take 5 mg by mouth daily. 04/29/20  Yes [provider]  anastrozole (  ARIMIDEX) 1 MG tablet Take 1 tablet (1 mg total) by mouth daily. 01/06/20  Yes Nicholas Lose, MD  ferrous sulfate 325 (65 FE) MG tablet Take 325 mg by mouth daily with breakfast.   Yes [provider]  irbesartan (AVAPRO) 300 MG tablet Take 300 mg by mouth daily. 02/11/19  Yes [provider]  levothyroxine (SYNTHROID) 137 MCG tablet Take 137 mcg by mouth daily before breakfast. 07/02/18  Yes [provider]  metFORMIN (GLUCOPHAGE) 850 MG tablet Take 850 mg by mouth 2 (two) times daily with a meal.   Yes [provider]  metoprolol succinate (TOPROL XL) 25 MG 24 hr tablet Take 1 tablet (25 mg total) by mouth daily.  05/07/20 05/07/21 Yes Burnell Blanks, MD  Multiple Vitamins-Minerals (MULTIVITAMIN WITH MINERALS) tablet Take 1 tablet by mouth daily.   Yes [provider]  spironolactone (ALDACTONE) 25 MG tablet Take 25 mg by mouth daily. Prescribed by Derm for hair growth per pt.   Yes [provider]  TOUJEO SOLOSTAR 300 UNIT/ML SOPN Inject 10-15 Units into the skin See admin instructions. Inject 15 units into the skin in the morning before breakfast and 10 units at bedtime 03/12/15  Yes [provider]  traMADol (ULTRAM) 50 MG tablet Take 50 mg by mouth 2 (two) times daily.   Yes [provider]  apixaban (ELIQUIS) 5 MG TABS tablet Take 1 tablet (5 mg total) by mouth 2 (two) times daily. 11/16/16   Nicholas Lose, MD  diphenhydramine-acetaminophen (TYLENOL PM) 25-500 MG TABS tablet Take 1 tablet by mouth at bedtime as needed (for sleep).    [provider]  timolol (TIMOPTIC) 0.5 % ophthalmic solution 1 drop every morning. 05/22/20   [provider]     Review of Systems  Positive ROS: As above  All other systems have been reviewed and were otherwise negative with the exception of those mentioned in the HPI and as above.  Objective: Vital signs in last 24 hours: Temp:  [98.1 F (36.7 C)] 98.1 F (36.7 C) (09/07 0953) Pulse Rate:  [77] 77 (09/07 0953) Resp:  [18] 18 (09/07 0953) BP: (175)/(69) 175/69 (09/07 0953) SpO2:  [99 %] 99 % (09/07 0953) Weight:  [85.3 kg] 85.3 kg (09/07 0953) Estimated body mass index is 32.27 kg/m as calculated from the following:   Height as of this encounter: '5\' 4"'$  (1.626 m).   Weight as of this encounter: 85.3 kg.   General Appearance: Alert Head: Normocephalic, without obvious abnormality, atraumatic Eyes: PERRL, conjunctiva/corneas clear, EOM's intact,    Ears: Normal  Throat: Normal  Neck: Supple, Back: unremarkable Lungs: Clear to auscultation bilaterally, respirations unlabored Heart: Regular rate  and rhythm, no murmur, rub or gallop Abdomen: Soft, non-tender Extremities: Extremities normal, atraumatic, no cyanosis or edema Skin: unremarkable  NEUROLOGIC:   Mental status: alert and oriented,Motor Exam - grossly normal Sensory Exam - grossly normal Reflexes:  Coordination - grossly normal Gait - grossly normal Balance - grossly normal Cranial Nerves: I: smell Not tested  II: visual acuity  OS: Normal  OD: Normal   II: visual fields Full to confrontation  II: pupils Equal, round, reactive to light  III,VII: ptosis None  III,IV,VI: extraocular muscles  Full ROM  V: mastication Normal  V: facial light touch sensation  Normal  V,VII: corneal reflex  Present  VII: facial muscle function - upper  Normal  VII: facial muscle function - lower Normal  VIII: hearing Not tested  IX: soft  palate elevation  Normal  IX,X: gag reflex Present  XI: trapezius strength  5/5  XI: sternocleidomastoid strength 5/5  XI: neck flexion strength  5/5  XII: tongue strength  Normal    Data Review Lab Results  Component Value Date   WBC 8.9 11/17/2020   HGB 12.6 11/17/2020   HCT 38.8 11/17/2020   MCV 97.2 11/17/2020   PLT 289 11/17/2020   Lab Results  Component Value Date   NA 136 11/17/2020   K 3.8 11/17/2020   CL 107 11/17/2020   CO2 19 (L) 11/17/2020   BUN 25 (H) 11/17/2020   CREATININE 1.63 (H) 11/17/2020   GLUCOSE 182 (H) 11/17/2020   Lab Results  Component Value Date   INR 1.4 (H) 11/21/2018    Assessment/Plan: L3-4 and L4-5 spondylolisthesis, facet arthropathy, spinal stenosis, lumbago, lumbar radiculopathy, neurogenic claudication: I have discussed situation with the patient.  I reviewed her imaging studies with her and pointed out the abnormalities.  We have discussed the various treatment options including surgery.  I have described the surgical treatment option of an L3-4 and L4-5 decompression, instrumentation and fusion.  I have shown her surgical models.  I have given  her a surgical pamphlet.  We have discussed the risk, benefits, alternatives, expected postoperative course, and likelihood of achieving our goals with surgery.  I have answered all her questions.  She has decided proceed with surgery.   [name] 11/17/2020 2:39 PM

## 2020-11-17 NOTE — Progress Notes (Signed)
Orthopedic Tech Progress Note Patient Details:  Summer Hawkins Apr 25, 1934 [id number] Patient has brace Patient ID: [name], female   DOB: Mar 02, 1935, 86 y.o.   MRN: [id number]  Summer Hawkins 11/17/2020, 10:10 PM

## 2020-11-17 NOTE — Transfer of Care (Signed)
Immediate Anesthesia Transfer of Care Note  Patient: Summer Hawkins  Procedure(s) Performed: Posterior Lumbar Interbody Fusion, Interbody Prosthesis ,POSTERIOR INSTRUMENTATION Lumbar three- four, Lumbar four-five  Patient Location: PACU  Anesthesia Type:General  Level of Consciousness: awake  Airway & Oxygen Therapy: Patient Spontanous Breathing  Post-op Assessment: Report given to RN and Post -op Vital signs reviewed and stable  Post vital signs: Reviewed and stable  Last Vitals:  Vitals Value Taken Time  BP 135/78 11/17/20 1922  Temp    Pulse 74 11/17/20 1924  Resp 17 11/17/20 1924  SpO2 97 % 11/17/20 1924  Vitals shown include unvalidated device data.  Last Pain:  Vitals:   11/17/20 1044  TempSrc:   PainSc: 8       Patients Stated Pain Goal: 4 ([id number])  Complications: No notable events documented.

## 2020-11-17 NOTE — Anesthesia Procedure Notes (Signed)
Procedure Name: Intubation Date/Time: 11/17/2020 3:39 PM Performed by: Georgia Duff, CRNA Pre-anesthesia Checklist: Patient identified, Emergency Drugs available, Suction available and Patient being monitored Patient Re-evaluated:Patient Re-evaluated prior to induction Oxygen Delivery Method: Circle System Utilized Preoxygenation: Pre-oxygenation with 100% oxygen Induction Type: IV induction Ventilation: Mask ventilation without difficulty Laryngoscope Size: Miller and 2 Grade View: Grade I Tube type: Oral Tube size: 7.0 mm Number of attempts: 1 Airway Equipment and Method: Stylet and Oral airway Placement Confirmation: ETT inserted through vocal cords under direct vision, positive ETCO2 and breath sounds checked- equal and bilateral Secured at: 21 cm Tube secured with: Tape Dental Injury: Teeth and Oropharynx as per pre-operative assessment

## 2020-11-17 NOTE — Progress Notes (Signed)
Pharmacy Antibiotic Note  Summer Hawkins is a 86 y.o. female admitted on 11/17/2020 with surgical prophylaxis.  Pharmacy has been consulted for vancomycin dosing.  Plan: Vancomycin 1 g  x 1 dose this evening.  Height: '5\' 4"'$  (162.6 cm) Weight: 85.3 kg (188 lb) IBW/kg (Calculated) : 54.7  Temp (24hrs), Avg:97.7 F (36.5 C), Min:97.4 F (36.3 C), Max:98.1 F (36.7 C)  Recent Labs  Lab 11/17/20 1035  WBC 8.9  CREATININE 1.63*    Estimated Creatinine Clearance: 27.1 mL/min (A) (by C-G formula based on SCr of 1.63 mg/dL (H)).    Allergies  Allergen Reactions   Bee Venom Shortness Of Breath, Nausea And Vomiting and Other (See Comments)    Makes the patient feel faint, also   Penicillins Anaphylaxis, Hives, Swelling and Other (See Comments)    Has patient had a PCN reaction causing immediate rash, facial/tongue/throat swelling, SOB or lightheadedness with hypotension: Yes Has patient had a PCN reaction causing severe rash involving mucus membranes or skin necrosis: No Has patient had a PCN reaction that required hospitalization: No Has patient had a PCN reaction occurring within the last 10 years: No If all of the above answers are "NO", then may proceed with Cephalosporin use.    Liraglutide     Other reaction(s): Rash at injection site   Lisinopril     Other reaction(s): cough   Zocor [Simvastatin]     Other reaction(s): memory changes    [place] Crane, Vena Austria, BCPS, BCCP Clinical Pharmacist  11/17/2020 8:54 PM   [facility] pharmacy phone numbers are listed on [email or web address]  11/17/2020 8:53 PM

## 2020-11-17 NOTE — Progress Notes (Signed)
Medtronic rep paged. ?

## 2020-11-17 NOTE — Op Note (Signed)
Brief history: The patient is a 86 year old white female who has complained of back and left great and right leg pain consistent with neurogenic claudication/lumbar radiculopathy.  She has failed medical management and was worked up with lumbar x-rays and lumbar MRI which demonstrated L3-4 and L4-5 spinal stenosis, spondylolisthesis, etc.  I discussed the various treatment options with her.  She has decided proceed with surgery.    Preoperative diagnosis: L3-4 and L4-5 spondylolisthesis, degenerative disc disease, spinal stenosis compressing the L3, L4 and L5 nerve roots; lumbago; lumbar radiculopathy; neurogenic claudication  Postoperative diagnosis: The same  Procedure: Bilateral L3-4 and L4-5 laminotomy/foraminotomies/medial facetectomy to decompress the bilateral L3, L4 and L5 nerve roots(the work required to do this was in addition to the work required to do the posterior lumbar interbody fusion because of the patient's spinal stenosis, facet arthropathy. Etc. requiring a wide decompression of the nerve roots.);  L3-4 and L4-5 transforaminal lumbar interbody fusion with Proteo Os, local morselized autograft bone and Zimmer DBM; insertion of interbody prosthesis at L3-4 and L4-5 (globus peek expandable interbody prosthesis); posterior segmental instrumentation from L3 to L5 with globus titanium pedicle screws and rods; posterior lateral arthrodesis at L3-4 and L4-5 with Proteo OS, local morselized autograft bone and Zimmer DBM.  Surgeon: Dr. Earle Gell  Asst.: Arnetha Massy, NP  Anesthesia: Gen. endotracheal  Estimated blood loss: 300 cc  Drains: None  Complications: None  Description of procedure: The patient was brought to the operating room by the anesthesia team. General endotracheal anesthesia was induced. The patient was turned to the prone position on the Wilson frame. The patient's lumbosacral region was then prepared with Betadine scrub and Betadine solution. Sterile drapes were  applied.  I then injected the area to be incised with Marcaine with epinephrine solution. I then used the scalpel to make a linear midline incision over the L3-4 and L4-5 interspace. I then used electrocautery to perform a bilateral subperiosteal dissection exposing the spinous process and lamina of L3-4 and L4-5. We then obtained intraoperative radiograph to confirm our location. We then inserted the Verstrac retractor to provide exposure.  I began the decompression by using the high speed drill to perform laminotomies at L3-4 and L4-5 bilaterally. We then used the Kerrison punches to widen the laminotomy and removed the ligamentum flavum at L3-4 and L4-5 bilaterally. We used the Kerrison punches to remove the medial facets at L3-4 and L4-5 bilaterally, we removed the left facet at L3-4 and L4-5. We performed wide foraminotomies about the bilateral L3, L4 and L5 nerve roots completing the decompression.  We now turned our attention to the posterior lumbar interbody fusion. I used a scalpel to incise the intervertebral disc at L3-4 and L4-5 bilaterally. I then performed a partial intervertebral discectomy at L3-4 and L4-5 bilaterally using the pituitary forceps. We prepared the vertebral endplates at [id number] and [id number] bilaterally for the fusion by removing the soft tissues with the curettes. We then used the trial spacers to pick the appropriate sized interbody prosthesis. We prefilled his prosthesis with a combination of local morselized autograft bone that we obtained during the decompression as well as Zimmer DBM. We inserted the prefilled prosthesis into the interspace at L3-4 and L4-5 from the left, we then turned and expanded the prosthesis. There was a good snug fit of the prosthesis in the interspace. We then filled and the remainder of the intervertebral disc space with local morselized autograft bone and Zimmer DBM. This completed the posterior lumbar interbody arthrodesis.  During the decompression and  insertion of the prosthesis the assistant protected the thecal sac and nerve roots with the D'Errico retractor.  We now turned attention to the instrumentation. Under fluoroscopic guidance we cannulated the bilateral L3, L4 and L5 pedicles with the bone probe. We then removed the bone probe. We then tapped the pedicle with a 6.5 millimeter tap. We then removed the tap. We probed inside the tapped pedicle with a ball probe to rule out cortical breaches. We then inserted a 7.5 x 45 and 50 millimeter pedicle screw into the L3, L4 and L5 pedicles bilaterally under fluoroscopic guidance. We then palpated along the medial aspect of the pedicles to rule out cortical breaches. There were none. The nerve roots were not injured. We then connected the unilateral pedicle screws with a lordotic rod. We compressed the construct and secured the rod in place with the caps. We then tightened the caps appropriately. This completed the instrumentation from L3-L5 bilaterally.  We now turned our attention to the posterior lateral arthrodesis at L3-4 and L4-5 bilaterally. We used the high-speed drill to decorticate the remainder of the facets, pars, transverse process at L3-4 and L4-5 bilaterally. We then applied a combination of local morselized autograft bone and Zimmer DBM over these decorticated posterior lateral structures. This completed the posterior lateral arthrodesis.  We then obtained hemostasis using bipolar electrocautery. We irrigated the wound out with bacitracin solution. We inspected the thecal sac and nerve roots and noted they were well decompressed. We then removed the retractor.  We injected Exparel . We reapproximated patient's thoracolumbar fascia with interrupted #1 Vicryl suture. We reapproximated patient's subcutaneous tissue with interrupted 2-0 Vicryl suture. The reapproximated patient's skin with Steri-Strips and benzoin. The wound was then coated with bacitracin ointment. A sterile dressing was  applied. The drapes were removed. The patient was subsequently returned to the supine position where they were extubated by the anesthesia team. He was then transported to the post anesthesia care unit in stable condition. All sponge instrument and needle counts were reportedly correct at the end of this case.

## 2020-11-18 LAB — BASIC METABOLIC PANEL
Anion gap: 12 (ref 5–15)
BUN: 26 mg/dL — ABNORMAL HIGH (ref 8–23)
CO2: 17 mmol/L — ABNORMAL LOW (ref 22–32)
Calcium: 8.9 mg/dL (ref 8.9–10.3)
Chloride: 103 mmol/L (ref 98–111)
Creatinine, Ser: 1.62 mg/dL — ABNORMAL HIGH (ref 0.44–1.00)
GFR, Estimated: 31 mL/min — ABNORMAL LOW (ref >60)
Glucose, Bld: 304 mg/dL — ABNORMAL HIGH (ref 70–99)
Potassium: 4.4 mmol/L (ref 3.5–5.1)
Sodium: 132 mmol/L — ABNORMAL LOW (ref 135–145)

## 2020-11-18 LAB — CBC
HCT: 31.9 % — ABNORMAL LOW (ref 36.0–46.0)
Hemoglobin: 10.3 g/dL — ABNORMAL LOW (ref 12.0–15.0)
MCH: 31.5 pg (ref 26.0–34.0)
MCHC: 32.3 g/dL (ref 30.0–36.0)
MCV: 97.6 fL (ref 80.0–100.0)
Platelets: 224 10*3/uL (ref 150–400)
RBC: 3.27 MIL/uL — ABNORMAL LOW (ref 3.87–5.11)
RDW: 13.7 % (ref 11.5–15.5)
WBC: 12.7 10*3/uL — ABNORMAL HIGH (ref 4.0–10.5)
nRBC: 0 % (ref 0.0–0.2)

## 2020-11-18 LAB — GLUCOSE, CAPILLARY
Glucose-Capillary: 273 mg/dL — ABNORMAL HIGH (ref 70–99)
Glucose-Capillary: 226 mg/dL — ABNORMAL HIGH (ref 70–99)
Glucose-Capillary: 230 mg/dL — ABNORMAL HIGH (ref 70–99)
Glucose-Capillary: 166 mg/dL — ABNORMAL HIGH (ref 70–99)

## 2020-11-18 MED ORDER — PROPOFOL 500 MG/50ML IV EMUL
INTRAVENOUS | Status: AC
  Filled 2020-11-18: qty 50

## 2020-11-18 MED ORDER — INSULIN GLARGINE-YFGN 100 UNIT/ML ~~LOC~~ SOLN
12.0000 [IU] | Freq: Every day | SUBCUTANEOUS | Status: DC
  Administered 2020-11-18 – 2020-11-22 (×5): 12 [IU] via SUBCUTANEOUS
  Filled 2020-11-18 (×5): qty 0.12

## 2020-11-18 MED FILL — Thrombin For Soln 5000 Unit: CUTANEOUS | Qty: 5000 | Status: AC

## 2020-11-18 NOTE — Anesthesia Postprocedure Evaluation (Signed)
Anesthesia Post Note  Patient: Summer Hawkins  Procedure(s) Performed: Posterior Lumbar Interbody Fusion, Interbody Prosthesis ,POSTERIOR INSTRUMENTATION Lumbar three- four, Lumbar four-five     Patient location during evaluation: PACU Anesthesia Type: General Level of consciousness: awake and alert Pain management: pain level controlled Vital Signs Assessment: post-procedure vital signs reviewed and stable Respiratory status: spontaneous breathing, nonlabored ventilation, respiratory function stable and patient connected to nasal cannula oxygen Cardiovascular status: blood pressure returned to baseline and stable Postop Assessment: no apparent nausea or vomiting Anesthetic complications: no   No notable events documented.  Last Vitals:  Vitals:   11/17/20 2301 11/18/20 0323  BP: 123/63 138/85  Pulse: 70 70  Resp: 18 20  Temp: 36.4 C (!) 36.3 C  SpO2: 97% 100%    Last Pain:  Vitals:   11/18/20 0447  TempSrc:   PainSc: Story

## 2020-11-18 NOTE — TOC Initial Note (Addendum)
Transition of Care [facility]) - Initial/Assessment Note    Patient Details  Name: Summer Hawkins MRN: [id number] Date of Birth: [birth year]  [facility]) CM/SW Contact:    [name], LCSW Phone Number: 11/18/2020, 1:45 PM  Clinical Narrative:  CSW spoke with pt regarding recommendation for SNF.  Pt reports she is from [facility] and has been there for 10 days.  Then pt said something that she may have left [facility] and returned home.  Pt indicated she is willing to return to [facility].  Choice document given.  Pt reports she lives alone with [facility] aide support 5 days per week.  Permission given to speak with her granddaughter Summer Hawkins.  Pt is vaccinated for covid with one booster.   CSW LM with Janie at [facility] requesting clarification.   1500: TC Janie.  Pt did come to [facility] directly from [facility] and can return based on bed availability.                    Expected Discharge Plan: Skilled Nursing Facility Barriers to Discharge: SNF Pending bed offer   Patient Goals and CMS Choice Patient states their goals for this hospitalization and ongoing recovery are:: no more pain CMS Medicare.gov Compare Post Acute Care list provided to:: Patient Choice offered to / list presented to : Patient  Expected Discharge Plan and Services Expected Discharge Plan: [place] In-house Referral: Clinical Social Work   Post Acute Care Choice: [place] Living arrangements for the past 2 months: [place] (pt recently at [facility])                                      Prior Living Arrangements/Services Living arrangements for the past 2 months: [place] (pt recently at [facility]) Lives with:: Self Patient language and need for interpreter reviewed:: Yes Do you feel safe going back to the place where you live?: No   needs more help than her aid can provide  Need for Family Participation in Patient Care: Yes  (Comment) Care giver support system in place?: Yes (comment) Current home services: Homehealth aide ([place] aid 5 days per week, 4 hours per day) Criminal Activity/Legal Involvement Pertinent to Current Situation/Hospitalization: No - Comment as needed  Activities of Daily Living Home Assistive Devices/Equipment: Walker (specify type), CBG Meter, Eyeglasses, Wheelchair, Dentures (specify type) ADL Screening (condition at time of admission) Patient's cognitive ability adequate to safely complete daily activities?: No Is the patient deaf or have difficulty hearing?: Yes Does the patient have difficulty seeing, even when wearing glasses/contacts?: No Does the patient have difficulty concentrating, remembering, or making decisions?: No Patient able to express need for assistance with ADLs?: Yes Does the patient have difficulty dressing or bathing?: No Independently performs ADLs?: Yes (appropriate for developmental age) Does the patient have difficulty walking or climbing stairs?: Yes Weakness of Legs: Both Weakness of Arms/Hands: None  Permission Sought/Granted Permission sought to share information with : Family Supports Permission granted to share information with : Yes, Verbal Permission Granted  Share Information with NAME: Summer Hawkins, granddaughter           Emotional Assessment Appearance:: Appears stated age Attitude/Demeanor/Rapport: Engaged Affect (typically observed): Appropriate, Pleasant Orientation: : Oriented to Self, Oriented to Place, Oriented to  Time, Oriented to Situation Alcohol / Substance Use: Not Applicable Psych Involvement: No (comment)  Admission diagnosis:  Spondylolisthesis, lumbar region [M43.16] Patient Active Problem List   Diagnosis Date Noted   Spondylolisthesis, lumbar region 11/17/2020   Posterior capsular opacification, left 03/31/2020   Posterior vitreous detachment of both eyes 03/31/2020   Intermediate stage nonexudative age-related  macular degeneration of both eyes 09/25/2019   Moderate nonproliferative diabetic retinopathy of both eyes ([place]) 09/25/2019   Pacemaker 03/20/2019   Symptomatic bradycardia 11/30/2018   Syncope and collapse 11/21/2018   Bradycardia 09/25/2017   Permanent atrial fibrillation (HCC)    Chronic diastolic CHF (congestive heart failure) ([place]) 08/08/2017   Essential hypertension 08/08/2017   Breast cancer of upper-outer quadrant of left female breast ([place]) 01/05/2016   Chronic atrial fibrillation ([place]) 01/05/2011   PCP:  Haywood Pao, MD Pharmacy:   [facility] [place] - [address] [place] [address] Phone: [phone] Fax: [phone]  [facility]  ([place], [address] [place] [address] Phone: [phone] Fax: [place] [place] [address] Phone: [phone] Fax: [phone]     Social Determinants of Health (SDOH) Interventions    Readmission Risk Interventions No flowsheet data found.

## 2020-11-18 NOTE — Evaluation (Addendum)
Occupational Therapy Evaluation Patient Details Name: Summer Hawkins MRN: [id number] DOB: 1934/08/08 Today's Date: 11/18/2020    History of Present Illness 86 year old female s/p L3-4, L4-5 fusion. Pt with PMH significant for A fib, breast ca, CKD stage III, DM, HTN, pacemaker, lumpectomy, RC repair, and COVID-19 infection.   Clinical Impression   PTA, pt reported she was receiving rehab at a SNF and she was performing BADLs and short distance mobility with RW. Pt is currently requiring supervision/setup assist with UB ADLs, Min guard for LB ADLs, and Min guard-Min A for functional mobility. Pt educated on compensatory strategies to adhere to back precautions during bed mobility, oral care, dressing, and functional mobility and given handout. Pt with decreased functional strength, ROM, and endurance limiting ability to complete ADLs safely and independently.Recommend d/c to SNF for further rehab as pt presents with poor activity tolerance, high fall risk, and decreased home support. Will continue to provide OT in the acute setting to address safety with back precautions during ADLs.    Follow Up Recommendations  SNF    Equipment Recommendations  None recommended by OT    Recommendations for Other Services       Precautions / Restrictions Precautions Precautions: Fall;Back Precaution Comments: Has had multiple falls at home, no bending, lifting, or twisting. Required Braces or Orthoses: Spinal Brace Spinal Brace: Lumbar corset Restrictions Weight Bearing Restrictions: No      Mobility Bed Mobility               General bed mobility comments: Pt sitting EOB upon arrival    Transfers Overall transfer level: Needs assistance Equipment used: 2 person hand held assist;Rolling walker (2 wheeled) Transfers: Sit to/from Stand Sit to Stand: Min assist; Mod A         General transfer comment: Pt requiring Min A for weight shift forward, then able to power up into standing.  Cues for hand placement. At low straight back chair with no hand rests, pt requiring Mod A for weight shift and initial power up.    Balance Overall balance assessment: Needs assistance Sitting-balance support: Feet supported;Bilateral upper extremity supported Sitting balance-Leahy Scale: Good Sitting balance - Comments: Needed supervision for safety in dynamic sitting   Standing balance support: Bilateral upper extremity supported Standing balance-Leahy Scale: Poor Standing balance comment: Required Min guard and rest break after ambualting out into hall before ambulating back in. Legs are unsteady/shaky                           ADL either performed or assessed with clinical judgement   ADL Overall ADL's : Needs assistance/impaired Eating/Feeding: Modified independent   Grooming: Sitting;Set up;Supervision/safety   Upper Body Bathing: Sitting;Set up;Supervision/ safety   Lower Body Bathing: Min guard.   Upper Body Dressing : Set up;Supervision/safety;Sitting   Lower Body Dressing: Min guard;Cueing for back precautions Lower Body Dressing Details (indicate cue type and reason): Pt educated on compensatory strategy donning underwear with figure four position to adhere to back precautions. Min guard for safety when pulling up pants in standing. Toilet Transfer: Min A (Simulated sit to stand EOB) Toilet Transfer Details (indicate cue type and reason): pt required assistance to power up into standing and shift weight forward into standing. Toileting- Clothing Manipulation and Hygiene: Min guard Toileting - Clothing Manipulation Details (indicate cue type and reason): Required min guard for safety while pulling up pants over hips in standing.  Functional mobility during ADLs: Min guard;Min A;Rolling walker General ADL Comments: Pt with decreased functional strength, activity tolerance, and balance limiting ability to complete self care safely and independently.      Vision Baseline Vision/History: 1 Wears glasses Ability to See in Adequate Light: 1 Impaired Vision Assessment?: No apparent visual deficits Additional Comments: Other than needing glasses.     Perception     Praxis      Pertinent Vitals/Pain Pain Assessment: Faces Faces Pain Scale: Hurts little more Pain Location: back Pain Descriptors / Indicators: Grimacing;Guarding Pain Intervention(s): Monitored during session     Hand Dominance Right   Extremity/Trunk Assessment Upper Extremity Assessment Upper Extremity Assessment: Generalized weakness   Lower Extremity Assessment Lower Extremity Assessment: Defer to PT evaluation   Cervical / Trunk Assessment Cervical / Trunk Assessment: Other exceptions Cervical / Trunk Exceptions: s/p lumbar surgery   Communication Communication Communication: No difficulties   Cognition Arousal/Alertness: Awake/alert Behavior During Therapy: WFL for tasks assessed/performed Overall Cognitive Status: Within Functional Limits for tasks assessed                                 General Comments: Follows one step commands with increased time. Loquacious and continued conversation throughout activity.   General Comments       Exercises     Shoulder Instructions      Home Living Family/patient expects to be discharged to:: Skilled nursing facility Living Arrangements: Other (Comment) (Pt was living at a SNF, but would like to be discharged home with her friend who she pays to help her out 2 hrs in morning and at night.) Available Help at Discharge: Family;Available PRN/intermittently;Friend(s) (Friend is available 2 hrs in morning, 2 hrs at night.) Type of Home: House Home Access: Stairs to enter [employer] of Steps: 2 Entrance Stairs-Rails: Left Home Layout: One level     Bathroom Shower/Tub: Walk-in shower;Tub/shower unit   Bathroom Toilet: Handicapped height     Home Equipment: Walker - 2  wheels;Grab bars - tub/shower;Shower seat;Hand held shower head   Additional Comments: Pt has caregiver/friend (paid) who can assist 2 hours in morning and at night.      Prior Functioning/Environment Level of Independence: Needs assistance  Gait / Transfers Assistance Needed: RW ADL's / Homemaking Assistance Needed: Dressed self independendently, was recieiving rehab at [facility] where she had assistance with IADLs. Communication / Swallowing Assistance Needed: None Comments: Used RW initially, but then had trouble even standing.        OT Problem List: Decreased strength;Decreased range of motion;Decreased activity tolerance;Impaired balance (sitting and/or standing);Decreased knowledge of precautions;Pain      OT Treatment/Interventions: Self-care/ADL training;Therapeutic exercise;Energy conservation;DME and/or AE instruction;Therapeutic activities;Patient/family education    OT Goals(Current goals can be found in the care plan section) Acute Rehab OT Goals Patient Stated Goal: To go home OT Goal Formulation: With patient Time For Goal Achievement: 12/02/20 Potential to Achieve Goals: Good  OT Frequency: Min 2X/week   Barriers to D/C: Decreased caregiver support  Pt family/neighbors only availble for help intermittently, caregiver assists in morning and evening.       Co-evaluation              AM-PAC OT "6 Clicks" Daily Activity     Outcome Measure Help from another person eating meals?: None Help from another person taking care of personal grooming?: A Little Help from another person toileting, which includes using toliet, bedpan,  or urinal?: A Lot Help from another person bathing (including washing, rinsing, drying)?: A Little Help from another person to put on and taking off regular upper body clothing?: A Little Help from another person to put on and taking off regular lower body clothing?: A Little 6 Click Score: 18   End of Session Nurse Communication: Mobility  status Equipment Utilized during treatment: Gait belt; RW; Back brace. Activity Tolerance: Pt tolerated treatment well Patient left: in chair;with chair alarm set;with call bell/phone within reach  OT Visit Diagnosis: Unsteadiness on feet (R26.81);Other abnormalities of gait and mobility (R26.89);Muscle weakness (generalized) (M62.81);Pain Pain - part of body:  (Back)                Time: [id number] OT Time Calculation (min): 37 min Charges:  OT General Charges $OT Visit: 1 Visit OT Evaluation $OT Eval Moderate Complexity: 1 Mod OT Treatments $Self Care/Home Management : 8-22 mins  Jackquline Denmark, OTS Acute Rehab Office: [phone]   Makensie Mulhall 11/18/2020, 9:35 AM

## 2020-11-18 NOTE — Progress Notes (Signed)
   Providing Compassionate, Quality Care - Together   Subjective: Patient reports her LLE pain has resolved since surgery. She is working with therapies at the time of this assessment, who are recommending SNF at discharge. Patient would prefer to go home, but it's questionable if she will have the support she will need to thrive.  Objective: Vital signs in last 24 hours: Temp:  [97.4 F (36.3 C)-98.1 F (36.7 C)] 97.7 F (36.5 C) (09/08 0832) Pulse Rate:  [70-77] 72 (09/08 0832) Resp:  [12-20] 16 (09/08 0832) BP: (118-175)/(63-85) 122/67 (09/08 0832) SpO2:  [93 %-100 %] 99 % (09/08 0832) Weight:  [85.3 kg] 85.3 kg (09/07 0953)  Intake/Output from previous day: 09/07 0701 - 09/08 0700 In: 1300 [I.V.:1200; IV Piggyback:100] Out: 300 [Blood:300] Intake/Output this shift: No intake/output data recorded.  Alert and oriented x 4 PERRLA CN II-XII grossly intact MAE, Strength and sensation improving Incision is covered with Honeycomb dressing and Steri Strips; Dressing is clean, dry, and intact   Lab Results: Recent Labs    11/17/20 1035 11/18/20 0504  WBC 8.9 12.7*  HGB 12.6 10.3*  HCT 38.8 31.9*  PLT 289 224   BMET Recent Labs    11/17/20 1035 11/18/20 0504  NA 136 132*  K 3.8 4.4  CL 107 103  CO2 19* 17*  GLUCOSE 182* 304*  BUN 25* 26*  CREATININE 1.63* 1.62*  CALCIUM 9.5 8.9    Studies/Results: DG Lumbar Spine 2-3 Views  Result Date: 11/17/2020 CLINICAL DATA:  Posterior lumbar interbody fusion. EXAM: LUMBAR SPINE - 2-3 VIEW COMPARISON:  Radiograph 10/28/2020 confirming 5 lumbar type vertebra. FINDINGS: Two fluoroscopic spot views obtained in the operating room. Intrapedicular screws at L3 through L5 with interbody spacers in place. Fluoroscopy time 17 seconds. Dose 14.06 mGy. IMPRESSION: Fluoroscopic spot views during L3-L5 fusion. Electronically Signed   By: Keith Rake M.D.   On: 11/17/2020 19:15   DG Lumbar Spine 1 View  Result Date:  11/17/2020 CLINICAL DATA:  Elective surgery, obtained in the operating room. EXAM: LUMBAR SPINE - 1 VIEW COMPARISON:  10/28/2020 confirming 5 lumbar type vertebra. FINDINGS: Cross-table lateral view of the lumbar spine obtained in the operating room. Surgical instruments localize posterior to the L4-L5 disc space projecting over the posterior elements. IMPRESSION: Surgical instruments posterior to the L4-L5 disc space. Electronically Signed   By: Keith Rake M.D.   On: 11/17/2020 19:14   DG C-Arm 1-60 Min-No Report  Result Date: 11/17/2020 Fluoroscopy was utilized by the requesting physician.  No radiographic interpretation.    Assessment/Plan: Patient is one day status post two level PLIF by Dr. Arnoldo Morale. Her pain is much improved.   LOS: 1 day   -Possibly SNF at discharge -Continue to mobilize   Viona Gilmore, [place], AGNP-C Nurse Practitioner  [facility] [place] [address], [place], [place] [address] P: [phone]    F: [phone]  11/18/2020, 8:44 AM

## 2020-11-18 NOTE — Progress Notes (Addendum)
Inpatient Diabetes Program Recommendations  AACE/ADA: New Consensus Statement on Inpatient Glycemic Control (2015)  Target Ranges:  Prepandial:   less than 140 mg/dL      Peak postprandial:   less than 180 mg/dL (1-2 hours)      Critically ill patients:  140 - 180 mg/dL   Lab Results  Component Value Date   GLUCAP 273 (H) 11/18/2020   HGBA1C 6.9 (H) 11/21/2018    Review of Glycemic Control Results for Summer Hawkins, Summer Hawkins (MRN [id number]) as of 11/18/2020 09:06  Ref. Range 11/17/2020 19:21 11/17/2020 21:43 11/18/2020 06:12  Glucose-Capillary Latest Ref Range: 70 - 99 mg/dL 155 (H) 310 (H) 273 (H)   Diabetes history: DM 2 Outpatient Diabetes medications:  Metformin 850 mg bid, Toujeo 15 units in the morning and 5 units q HS Current orders for Inpatient glycemic control:  Novolog resistant tid with meals and HS Metformin 850 mg bid Inpatient Diabetes Program Recommendations:    Note blood sugars increased after patient received Decadron 10 mg x 1.  Recommend adding Semglee 12 units daily.    Thanks,  Adah Perl, RN, BC-ADM Inpatient Diabetes Coordinator Pager [phone]  (8a-5p)

## 2020-11-18 NOTE — Evaluation (Signed)
Physical Therapy Evaluation Patient Details Name: Summer Hawkins MRN: [id number] DOB: Sep 30, 1934 Today's Date: 11/18/2020   History of Present Illness  86 year old female s/p L3-4, L4-5 fusion. Pt with PMH significant for A fib, breast ca, CKD stage III, DM, HTN, pacemaker, lumpectomy, RC repair.  Clinical Impression  Pt presents to PT with deficits in activity tolerance, strength, power, balance, functional mobility, and endurance. Pt fatigues quickly and demonstrates generalized weakness which both increase her risk for falls. Pt will benefit from continued aggressive mobilization to improve activity tolerance and reduce falls risk. PT recommends SNF placement as pt does not have consistent caregiver support available at this time and will need to be at a modified independent level of mobility prior to discharging home.    Follow Up Recommendations SNF;Supervision/Assistance - 24 hour    Equipment Recommendations  3in1 (PT)    Recommendations for Other Services       Precautions / Restrictions Precautions Precautions: Fall;Back Precaution Booklet Issued: Yes (comment) Precaution Comments: Has had multiple falls at home, no bending, lifting, or twisting. Required Braces or Orthoses: Spinal Brace Spinal Brace: Lumbar corset Restrictions Weight Bearing Restrictions: No      Mobility  Bed Mobility Overal bed mobility: Needs Assistance Bed Mobility: Sit to Sidelying;Rolling Rolling: Min guard       Sit to sidelying: Min assist General bed mobility comments: Pt sitting EOB upon arrival    Transfers Overall transfer level: Needs assistance Equipment used: Rolling walker (2 wheeled) Transfers: Sit to/from Stand Sit to Stand: Min guard         General transfer comment: pt required assistance to power up into standing and shift weight forward into standing.  Ambulation/Gait Ambulation/Gait assistance: Min guard Gait Distance (Feet): 50 Feet Assistive device: Rolling  walker (2 wheeled) Gait Pattern/deviations: Step-through pattern Gait velocity: reduced Gait velocity interpretation: <1.8 ft/sec, indicate of risk for recurrent falls General Gait Details: pt with slowed step-through gait, increased trunk flexion  Stairs            Wheelchair Mobility    Modified Rankin (Stroke Patients Only)       Balance Overall balance assessment: Needs assistance Sitting-balance support: No upper extremity supported;Feet supported Sitting balance-Leahy Scale: Good Sitting balance - Comments: Needed supervision for safety in dynamic sitting   Standing balance support: Bilateral upper extremity supported Standing balance-Leahy Scale: Poor Standing balance comment: reliant on UE support of RW                             Pertinent Vitals/Pain Pain Assessment: Faces Faces Pain Scale: Hurts little more Pain Location: back Pain Descriptors / Indicators: Grimacing Pain Intervention(s): Monitored during session    Home Living Family/patient expects to be discharged to:: Skilled nursing facility Living Arrangements: Alone (friend available initially upon eventual return home) Available Help at Discharge: Friend(s);Available PRN/intermittently Type of Home: House Home Access: Stairs to enter Entrance Stairs-Rails: Left Entrance Stairs-Number of Steps: 2 Home Layout: One level Home Equipment: Walker - 2 wheels;Grab bars - tub/shower;Shower seat;Hand held shower head Additional Comments: Pt has caregiver/friend (paid) who can assist 2 hours in morning and at night.    Prior Function Level of Independence: Needs assistance   Gait / Transfers Assistance Needed: pt ambulates with RW, very limited by back and LE pain  ADL's / Homemaking Assistance Needed: pt reports independence with dressing and bathing  Comments: Used RW initially, but then had trouble even  standing.     Hand Dominance   Dominant Hand: Right    Extremity/Trunk  Assessment   Upper Extremity Assessment Upper Extremity Assessment: Defer to OT evaluation    Lower Extremity Assessment Lower Extremity Assessment: Generalized weakness    Cervical / Trunk Assessment Cervical / Trunk Assessment: Other exceptions Cervical / Trunk Exceptions: s/p lumbar surgery, dried blood noted on honeycomb dressing  Communication   Communication: No difficulties  Cognition Arousal/Alertness: Awake/alert Behavior During Therapy: WFL for tasks assessed/performed Overall Cognitive Status: Within Functional Limits for tasks assessed                                 General Comments: Follows one step commands with increased time. Loquacious and continued conversation throughout activity.      General Comments General comments (skin integrity, edema, etc.): VSS on RA    Exercises     Assessment/Plan    PT Assessment Patient needs continued PT services  PT Problem List Decreased strength;Decreased mobility;Decreased balance;Decreased activity tolerance;Pain       PT Treatment Interventions DME instruction;Gait training;Stair training;Functional mobility training;Therapeutic activities;Therapeutic exercise;Balance training;Neuromuscular re-education;Patient/family education    PT Goals (Current goals can be found in the Care Plan section)  Acute Rehab PT Goals Patient Stated Goal: to return to rehab and improve activity tolerance PT Goal Formulation: With patient Time For Goal Achievement: 12/02/20 Potential to Achieve Goals: Fair    Frequency Min 5X/week   Barriers to discharge Decreased caregiver support      Co-evaluation               AM-PAC PT "6 Clicks" Mobility  Outcome Measure Help needed turning from your back to your side while in a flat bed without using bedrails?: A Little Help needed moving from lying on your back to sitting on the side of a flat bed without using bedrails?: A Little Help needed moving to and from a  bed to a chair (including a wheelchair)?: A Little Help needed standing up from a chair using your arms (e.g., wheelchair or bedside chair)?: A Little Help needed to walk in hospital room?: A Little Help needed climbing 3-5 steps with a railing? : A Lot 6 Click Score: 17    End of Session   Activity Tolerance: Patient limited by fatigue Patient left: in bed;with call bell/phone within reach Nurse Communication: Mobility status PT Visit Diagnosis: Muscle weakness (generalized) (M62.81);Unsteadiness on feet (R26.81);Other abnormalities of gait and mobility (R26.89);Repeated falls (R29.6);History of falling (Z91.81);Pain Pain - part of body:  (back)    Time: [id number] PT Time Calculation (min) (ACUTE ONLY): 18 min   Charges:   PT Evaluation $PT Eval Low Complexity: 1 Low          Zenaida Niece, PT, DPT Acute Rehabilitation Pager: [phone]   Zenaida Niece 11/18/2020, 10:06 AM

## 2020-11-18 NOTE — NC FL2 (Signed)
[place] MEDICAID FL2 LEVEL OF CARE SCREENING TOOL     IDENTIFICATION  Patient Name: Summer Hawkins Birthdate: 07-Sep-1934 Sex: female Admission Date (Current Location): 11/17/2020  [facility] and [place] Number:  Herbalist and Address:  [facility], [place] [address] [place], [place] [address]      Provider Number: [id number]  Attending Physician Name and Address:  Newman Pies, MD  Relative Name and Phone Number:  Genella Rife, [phone]    Current Level of Care: Hospital Recommended Level of Care: [place] Prior Approval Number:    Date Approved/Denied:   PASRR Number: [id number]  Discharge Plan: SNF    Current Diagnoses: Patient Active Problem List   Diagnosis Date Noted   Spondylolisthesis, lumbar region 11/17/2020   Posterior capsular opacification, left 03/31/2020   Posterior vitreous detachment of both eyes 03/31/2020   Intermediate stage nonexudative age-related macular degeneration of both eyes 09/25/2019   Moderate nonproliferative diabetic retinopathy of both eyes ([place]) 09/25/2019   Pacemaker 03/20/2019   Symptomatic bradycardia 11/30/2018   Syncope and collapse 11/21/2018   Bradycardia 09/25/2017   Permanent atrial fibrillation (HCC)    Chronic diastolic CHF (congestive heart failure) ([place]) 08/08/2017   Essential hypertension 08/08/2017   Breast cancer of upper-outer quadrant of left female breast ([place]) 01/05/2016   Chronic atrial fibrillation ([place]) 01/05/2011    Orientation RESPIRATION BLADDER Height & Weight     Self, Time, Situation, Place  Normal Continent Weight: 188 lb (85.3 kg) Height:  '5\' 4"'$  (162.6 cm)  BEHAVIORAL SYMPTOMS/MOOD NEUROLOGICAL BOWEL NUTRITION STATUS      Continent Diet (see discharge summary)  AMBULATORY STATUS COMMUNICATION OF NEEDS Skin   Independent Verbally Surgical wounds                       Personal Care Assistance Level of Assistance    Bathing  Assistance: Limited assistance Feeding assistance: Independent Dressing Assistance: Limited assistance     Functional Limitations Info  Sight, Hearing, Speech   Hearing Info: Adequate Speech Info: Adequate    SPECIAL CARE FACTORS FREQUENCY  PT (By licensed PT), OT (By licensed OT)     PT Frequency: 5x week OT Frequency: 5x week            Contractures Contractures Info: Not present    Additional Factors Info  Code Status, Allergies Code Status Info: full Allergies Info: Bee Venom, Penicillins, Liraglutide, Lisinopril, Zocor (Simvastatin)           Current Medications (11/18/2020):  This is the current hospital active medication list Current Facility-Administered Medications  Medication Dose Route Frequency Provider Last Rate Last Admin   0.9 %  sodium chloride infusion  250 mL Intravenous Continuous Newman Pies, MD       acetaminophen (TYLENOL) tablet 650 mg  650 mg Oral Q4H PRN Newman Pies, MD       Or   acetaminophen (TYLENOL) suppository 650 mg  650 mg Rectal Q4H PRN Newman Pies, MD       acetaminophen (TYLENOL) tablet 1,000 mg  1,000 mg Oral Q6H Newman Pies, MD   1,000 mg at 11/18/20 1127   acetaminophen (TYLENOL) tablet 500 mg  500 mg Oral QHS PRN Newman Pies, MD       And   diphenhydrAMINE (BENADRYL) capsule 25 mg  25 mg Oral QHS PRN Newman Pies, MD       amLODipine (NORVASC) tablet 5 mg  5 mg Oral Daily  Newman Pies, MD   5 mg at 11/18/20 1128   anastrozole (ARIMIDEX) tablet 1 mg  1 mg Oral Daily Newman Pies, MD   1 mg at 11/18/20 1129   bisacodyl (DULCOLAX) suppository 10 mg  10 mg Rectal Daily PRN Newman Pies, MD       cyclobenzaprine (FLEXERIL) tablet 10 mg  10 mg Oral TID PRN Newman Pies, MD       docusate sodium (COLACE) capsule 100 mg  100 mg Oral BID Newman Pies, MD   100 mg at 11/18/20 1129   ferrous sulfate tablet 325 mg  325 mg Oral Q breakfast Newman Pies, MD   325 mg at 11/18/20 [id number]    insulin aspart (novoLOG) injection 0-20 Units  0-20 Units Subcutaneous TID WC Newman Pies, MD   7 Units at 11/18/20 1159   insulin aspart (novoLOG) injection 0-5 Units  0-5 Units Subcutaneous QHS Newman Pies, MD   4 Units at 11/17/20 2201   insulin glargine-yfgn (SEMGLEE) injection 12 Units  12 Units Subcutaneous Daily Viona Gilmore D, NP       irbesartan (AVAPRO) tablet 300 mg  300 mg Oral Daily Newman Pies, MD   300 mg at 11/18/20 1128   levothyroxine (SYNTHROID) tablet 137 mcg  137 mcg Oral QAC breakfast Newman Pies, MD   137 mcg at 11/18/20 [id number]   menthol-cetylpyridinium (CEPACOL) lozenge 3 mg  1 lozenge Oral PRN Newman Pies, MD       Or   phenol (CHLORASEPTIC) mouth spray 1 spray  1 spray Mouth/Throat PRN Newman Pies, MD       metFORMIN (GLUCOPHAGE) tablet 850 mg  850 mg Oral BID WC Newman Pies, MD   850 mg at 11/18/20 [id number]   metoprolol succinate (TOPROL-XL) 24 hr tablet 25 mg  25 mg Oral Daily Newman Pies, MD   25 mg at 11/18/20 1127   morphine 4 MG/ML injection 4 mg  4 mg Intravenous Q2H PRN Newman Pies, MD       multivitamin with minerals tablet 1 tablet  1 tablet Oral Daily Newman Pies, MD   1 tablet at 11/18/20 1128   ondansetron (ZOFRAN) tablet 4 mg  4 mg Oral Q6H PRN Newman Pies, MD       Or   ondansetron [facility]) injection 4 mg  4 mg Intravenous Q6H PRN Newman Pies, MD       oxyCODONE (Oxy IR/ROXICODONE) immediate release tablet 10 mg  10 mg Oral Q3H PRN Newman Pies, MD       oxyCODONE (Oxy IR/ROXICODONE) immediate release tablet 5 mg  5 mg Oral Q3H PRN Newman Pies, MD   5 mg at 11/18/20 0402   sodium chloride flush (NS) 0.9 % injection 3 mL  3 mL Intravenous Q12H Newman Pies, MD   3 mL at 11/18/20 1130   sodium chloride flush (NS) 0.9 % injection 3 mL  3 mL Intravenous PRN Newman Pies, MD       spironolactone (ALDACTONE) tablet 25 mg  25 mg Oral Daily Newman Pies, MD   25 mg at 11/18/20 1129    timolol (TIMOPTIC) 0.5 % ophthalmic solution 1 drop  1 drop Both Eyes BID Newman Pies, MD   1 drop at 11/18/20 1127   traMADol (ULTRAM) tablet 50 mg  50 mg Oral BID Newman Pies, MD   50 mg at 11/18/20 1129   zolpidem (AMBIEN) tablet 5 mg  5 mg Oral QHS PRN Newman Pies, MD  Discharge Medications: Please see discharge summary for a list of discharge medications.  Relevant Imaging Results:  Relevant Lab Results:   Additional Information SSN [id number]. Pt has been vaccinated for covid with one booster.  [name], LCSW

## 2020-11-19 LAB — GLUCOSE, CAPILLARY
Glucose-Capillary: 104 mg/dL — ABNORMAL HIGH (ref 70–99)
Glucose-Capillary: 96 mg/dL (ref 70–99)
Glucose-Capillary: 120 mg/dL — ABNORMAL HIGH (ref 70–99)
Glucose-Capillary: 115 mg/dL — ABNORMAL HIGH (ref 70–99)

## 2020-11-19 MED FILL — Sodium Chloride IV Soln 0.9%: INTRAVENOUS | Qty: 1000 | Status: AC

## 2020-11-19 MED FILL — Heparin Sodium (Porcine) Inj 1000 Unit/ML: INTRAMUSCULAR | Qty: 30 | Status: AC

## 2020-11-19 NOTE — TOC Progression Note (Signed)
Transition of Care [facility]) - Progression Note    Patient Details  Name: Summer Hawkins MRN: [id number] Date of Birth: 04-15-1934  Transition of [facility]) CM/SW Contact  Emeterio Reeve, [place] Phone Number: 11/19/2020, 4:10 PM  Clinical Narrative:     [facility] can accept pt back on Monday. Pt will need new covid test on Sunday.   TOC will follow.  Expected Discharge Plan: Skilled Nursing Facility Barriers to Discharge: SNF Pending bed offer  Expected Discharge Plan and Services Expected Discharge Plan: [place] In-house Referral: Clinical Social Work   Post Acute Care Choice: [place] Living arrangements for the past 2 months: [place] (pt recently at [facility])                                       Social Determinants of Health (SDOH) Interventions    Readmission Risk Interventions No flowsheet data found.  Emeterio Reeve, LCSW Clinical Social Worker

## 2020-11-19 NOTE — Progress Notes (Addendum)
Physical Therapy Treatment Patient Details Name: Summer Hawkins MRN: [id number] DOB: 1934-06-19 Today's Date: 11/19/2020    History of Present Illness 86 year old female s/p L3-4, L4-5 fusion. Pt with PMH significant for A fib, breast ca, CKD stage III, DM, HTN, pacemaker, lumpectomy, RC repair.    PT Comments    Pt admitted with above diagnosis. Pt was unable to progress to ambulation today. PT felt pt giving little effort due to pain as it appears she walked yesterday. Pt states her back hurts much more today and she had difficulty even sitting EOB without mod to min assist. Could not come to standing with total assist by PT.  Had to lie her back down.  Called nursing about replacing bloody dressing.  She messaged MD. Will continue to follow pt. Did updated frequency to 3xweek given that pt d/c plan is SNF.   Discussed back precautions and pt can state 3/3.  Pt currently with functional limitations due to balance and endurance deficits. Pt will benefit from skilled PT to increase their independence and safety with mobility to allow discharge to the venue listed below.      Follow Up Recommendations  SNF;Supervision/Assistance - 24 hour     Equipment Recommendations  3in1 (PT)    Recommendations for Other Services       Precautions / Restrictions Precautions Precautions: Fall;Back Precaution Booklet Issued: Yes (comment) Precaution Comments: Has had multiple falls at home, no bending, lifting, or twisting. Required Braces or Orthoses: Spinal Brace Spinal Brace: Lumbar corset Restrictions Weight Bearing Restrictions: No    Mobility  Bed Mobility Overal bed mobility: Needs Assistance Bed Mobility: Sit to Sidelying;Rolling;Sidelying to Sit Rolling: Mod assist Sidelying to sit: Max assist     Sit to sidelying: Min assist General bed mobility comments: Pt needed mod to max assist for rolling and side to sit as pt c/o incr pain today.  REfused OOB at first but did eventually agree  to try. Very difficult to get pt to EOB and difficult for pt to follow precautions.    Transfers Overall transfer level: Needs assistance Equipment used: Rolling walker (2 wheeled) Transfers: Sit to/from Stand Sit to Stand: Total assist;From elevated surface         General transfer comment: Even with total assist by PT, pt could not stand up with mutliple attempts and multiple strategies.  Pt lacking effort and ability to power up into standing and shift weight forward into standing therefore had to assist her back to bed after failed attempts.  Ambulation/Gait                 Stairs             Wheelchair Mobility    Modified Rankin (Stroke Patients Only)       Balance Overall balance assessment: Needs assistance Sitting-balance support: No upper extremity supported;Feet supported;Bilateral upper extremity supported Sitting balance-Leahy Scale: Poor Sitting balance - Comments: could not sit EOB without assist as pt leaning backwards needing assist throughout.   Standing balance support: Bilateral upper extremity supported Standing balance-Leahy Scale: Zero Standing balance comment: Could not stand with total assist                            Cognition Arousal/Alertness: Awake/alert Behavior During Therapy: WFL for tasks assessed/performed Overall Cognitive Status: Within Functional Limits for tasks assessed  General Comments: Follows one step commands with increased time. Loquacious and continued conversation throughout activity.      Exercises      General Comments General comments (skin integrity, edema, etc.): VSS on RA, donned brace with total assist.      Pertinent Vitals/Pain Pain Assessment: Faces Faces Pain Scale: Hurts whole lot Pain Location: back Pain Descriptors / Indicators: Grimacing Pain Intervention(s): Limited activity within patient's tolerance;Monitored during  session;Premedicated before session;Repositioned    Home Living                      Prior Function            PT Goals (current goals can now be found in the care plan section) Acute Rehab PT Goals Patient Stated Goal: to return to rehab and improve activity tolerance Progress towards PT goals: Not progressing toward goals - comment (Pt unable to stand today.  Pt giving little effort.)    Frequency    Min 3X/week      PT Plan Current plan remains appropriate;Frequency needs to be updated    Co-evaluation              AM-PAC PT "6 Clicks" Mobility   Outcome Measure  Help needed turning from your back to your side while in a flat bed without using bedrails?: A Lot Help needed moving from lying on your back to sitting on the side of a flat bed without using bedrails?: A Lot Help needed moving to and from a bed to a chair (including a wheelchair)?: Total Help needed standing up from a chair using your arms (e.g., wheelchair or bedside chair)?: Total Help needed to walk in hospital room?: Total Help needed climbing 3-5 steps with a railing? : Total 6 Click Score: 8    End of Session Equipment Utilized During Treatment: Gait belt Activity Tolerance: Patient limited by fatigue;Patient limited by pain Patient left: in bed;with call bell/phone within reach;with bed alarm set;with nursing/sitter in room Nurse Communication: Mobility status;Need for lift equipment;Patient requests pain meds PT Visit Diagnosis: Muscle weakness (generalized) (M62.81);Unsteadiness on feet (R26.81);Other abnormalities of gait and mobility (R26.89);Repeated falls (R29.6);History of falling (Z91.81);Pain Pain - part of body:  (back)     Time: [id number] PT Time Calculation (min) (ACUTE ONLY): 30 min  Charges:  $Therapeutic Activity: 23-37 mins                     Lexee Brashears M,PT Acute Rehab Services [phone] (pager)    Alvira Philips 11/19/2020, 11:42 AM

## 2020-11-19 NOTE — Progress Notes (Addendum)
   Providing Compassionate, Quality Care - Together   Subjective: Patient reports increased postoperative pain today. Nurse just gave pain medication.  Objective: Vital signs in last 24 hours: Temp:  [97.3 F (36.3 C)-98.3 F (36.8 C)] 98.3 F (36.8 C) (09/09 0753) Pulse Rate:  [68-80] 70 (09/09 0753) Resp:  [16-20] 20 (09/09 0753) BP: (83-120)/(49-69) 105/52 (09/09 0753) SpO2:  [95 %-99 %] 98 % (09/09 0753)  Intake/Output from previous day: No intake/output data recorded. Intake/Output this shift: No intake/output data recorded.  Alert and oriented x 4 PERRLA CN II-XII grossly intact MAE, Strength and sensation improving Incision is covered with Honeycomb dressing and Steri Strips; Dressing is clean, dry, and intact    Lab Results: Recent Labs    11/17/20 1035 11/18/20 0504  WBC 8.9 12.7*  HGB 12.6 10.3*  HCT 38.8 31.9*  PLT 289 224   BMET Recent Labs    11/17/20 1035 11/18/20 0504  NA 136 132*  K 3.8 4.4  CL 107 103  CO2 19* 17*  GLUCOSE 182* 304*  BUN 25* 26*  CREATININE 1.63* 1.62*  CALCIUM 9.5 8.9    Studies/Results: DG Lumbar Spine 2-3 Views  Result Date: 11/17/2020 CLINICAL DATA:  Posterior lumbar interbody fusion. EXAM: LUMBAR SPINE - 2-3 VIEW COMPARISON:  Radiograph 10/28/2020 confirming 5 lumbar type vertebra. FINDINGS: Two fluoroscopic spot views obtained in the operating room. Intrapedicular screws at L3 through L5 with interbody spacers in place. Fluoroscopy time 17 seconds. Dose 14.06 mGy. IMPRESSION: Fluoroscopic spot views during L3-L5 fusion. Electronically Signed   By: Keith Rake M.D.   On: 11/17/2020 19:15   DG Lumbar Spine 1 View  Result Date: 11/17/2020 CLINICAL DATA:  Elective surgery, obtained in the operating room. EXAM: LUMBAR SPINE - 1 VIEW COMPARISON:  10/28/2020 confirming 5 lumbar type vertebra. FINDINGS: Cross-table lateral view of the lumbar spine obtained in the operating room. Surgical instruments localize posterior to  the L4-L5 disc space projecting over the posterior elements. IMPRESSION: Surgical instruments posterior to the L4-L5 disc space. Electronically Signed   By: Keith Rake M.D.   On: 11/17/2020 19:14   DG C-Arm 1-60 Min-No Report  Result Date: 11/17/2020 Fluoroscopy was utilized by the requesting physician.  No radiographic interpretation.    Assessment/Plan: Patient is two days status post two level PLIF by Dr. Arnoldo Morale.   LOS: 2 days   -Planning for SNF -Continue to encourage patient to mobilize   Viona Gilmore, DNP, AGNP-C Nurse Practitioner  [facility] [place]. [address], [place], [place] [address] P: [phone]    F: [phone]  11/19/2020, 9:22 AM   ]

## 2020-11-19 NOTE — Progress Notes (Addendum)
   Providing Compassionate, Quality Care - Together   Received call from bedside nurse that upon sitting patient at edge of bed, patient's dressing was noted to be saturated with bloody drainage. Upon assessment, Honeycomb dressing was saturated with bright red sanguinous drainage. Dressing was removed. Steri Strips were saturated and no longer adherent. These were removed as well. Surgical wound was not actively draining. A pressure dressing was applied with the aid of the bedside RN. I recommended removal of the pressure dressing tomorrow and application of a new Honeycomb dressing.  Viona Gilmore, DNP, AGNP-C Nurse Practitioner 11/19/2020 11:13 AM  [facility] [place] [address], [address], [place], [place] [address] P: [phone]    F: [phone]

## 2020-11-20 LAB — GLUCOSE, CAPILLARY
Glucose-Capillary: 154 mg/dL — ABNORMAL HIGH (ref 70–99)
Glucose-Capillary: 153 mg/dL — ABNORMAL HIGH (ref 70–99)
Glucose-Capillary: 137 mg/dL — ABNORMAL HIGH (ref 70–99)
Glucose-Capillary: 137 mg/dL — ABNORMAL HIGH (ref 70–99)

## 2020-11-20 NOTE — Plan of Care (Signed)
  Problem: Bowel/Gastric: Goal: Gastrointestinal status for postoperative course will improve Outcome: Progressing   Problem: Safety: Goal: Ability to remain free from injury will improve Outcome: Progressing

## 2020-11-20 NOTE — Progress Notes (Signed)
Neurosurgery Service Progress Note  Subjective: No acute events overnight, no new complaints, mild to moderate back pain, preop radicular symptoms are resolved   Objective: Vitals:   11/19/20 2012 11/20/20 0410 11/20/20 0841 11/20/20 1012  BP: 103/60 119/62 (!) 114/56 125/64  Pulse: 69 71 70 75  Resp:  16 18   Temp: 98.2 F (36.8 C) 98 F (36.7 C) 98.9 F (37.2 C) 98.1 F (36.7 C)  TempSrc: Oral  Oral Oral  SpO2: 99% 93% 95% 93%  Weight:      Height:        Physical Exam: Strength 5/5x4, SILTx4   Assessment & Plan: 86 y.o. woman s/p repair of spondylolisthesis, recovering well.  -new covid test tomorrow 9/11 -activity as tolerated -plan for return to SNF 9/12  Judith Part  11/20/20 12:39 PM

## 2020-11-21 LAB — GLUCOSE, CAPILLARY
Glucose-Capillary: 137 mg/dL — ABNORMAL HIGH (ref 70–99)
Glucose-Capillary: 186 mg/dL — ABNORMAL HIGH (ref 70–99)
Glucose-Capillary: 132 mg/dL — ABNORMAL HIGH (ref 70–99)
Glucose-Capillary: 123 mg/dL — ABNORMAL HIGH (ref 70–99)
Glucose-Capillary: 114 mg/dL — ABNORMAL HIGH (ref 70–99)

## 2020-11-21 LAB — SARS CORONAVIRUS 2 (TAT 6-24 HRS): SARS Coronavirus 2: NEGATIVE

## 2020-11-21 MED ORDER — SODIUM CHLORIDE 0.9% FLUSH: 10.0000 mL | INTRAVENOUS | Status: DC | PRN

## 2020-11-21 MED ORDER — SODIUM CHLORIDE 0.9% FLUSH
10.0000 mL | Freq: Two times a day (BID) | INTRAVENOUS | Status: DC
  Administered 2020-11-21 – 2020-11-22 (×3): 10 mL

## 2020-11-21 NOTE — TOC Progression Note (Signed)
Transition of Care [facility]) - Progression Note    Patient Details  Name: Summer Hawkins MRN: [id number] Date of Birth: 05/26/1934  Transition of Care [facility]) CM/SW Contact  Ina Homes, [place] Phone Number: 11/21/2020, 9:42 AM  Clinical Narrative:     SW requested COVID test from bedside RN for SNF tomorrow  Expected Discharge Plan: [place] Barriers to Discharge: SNF Pending bed offer  Expected Discharge Plan and Services Expected Discharge Plan: [place] In-house Referral: Clinical Social Work   Post Acute Care Choice: [place] Living arrangements for the past 2 months: [place] (pt recently at [facility])                                       Social Determinants of Health (SDOH) Interventions    Readmission Risk Interventions No flowsheet data found.

## 2020-11-21 NOTE — Plan of Care (Signed)
  Problem: Safety: Goal: Ability to remain free from injury will improve Outcome: Progressing   

## 2020-11-21 NOTE — Progress Notes (Signed)
Neurosurgery Service Progress Note  Subjective: No acute events overnight, no new complaints, mild to moderate back pain, preop radicular symptoms are resolved   Objective: Vitals:   11/20/20 1858 11/20/20 2033 11/21/20 0458 11/21/20 0810  BP: 120/62 110/76 (!) 142/67 (!) 150/72  Pulse: 72 70 70 72  Resp: 18   18  Temp: 98.4 F (36.9 C) 98.8 F (37.1 C) 99.6 F (37.6 C) 98.1 F (36.7 C)  TempSrc: Oral Oral Oral Oral  SpO2: 94% 98% 96% 99%  Weight:      Height:        Physical Exam: Strength 5/5x4, SILTx4   Assessment & Plan: 86 y.o. woman s/p repair of spondylolisthesis, recovering well.  -new covid test sent for return to SNF tomorrow -activity as tolerated  Judith Part  11/21/20 10:18 AM

## 2020-11-22 DIAGNOSIS — Z950 Presence of cardiac pacemaker: Secondary | ICD-10-CM | POA: Diagnosis not present

## 2020-11-22 DIAGNOSIS — J9690 Respiratory failure, unspecified, unspecified whether with hypoxia or hypercapnia: Secondary | ICD-10-CM | POA: Diagnosis not present

## 2020-11-22 DIAGNOSIS — I5030 Unspecified diastolic (congestive) heart failure: Secondary | ICD-10-CM | POA: Diagnosis not present

## 2020-11-22 DIAGNOSIS — C50412 Malignant neoplasm of upper-outer quadrant of left female breast: Secondary | ICD-10-CM | POA: Diagnosis present

## 2020-11-22 DIAGNOSIS — I10 Essential (primary) hypertension: Secondary | ICD-10-CM | POA: Diagnosis not present

## 2020-11-22 DIAGNOSIS — D649 Anemia, unspecified: Secondary | ICD-10-CM | POA: Diagnosis not present

## 2020-11-22 DIAGNOSIS — I482 Chronic atrial fibrillation, unspecified: Secondary | ICD-10-CM | POA: Diagnosis present

## 2020-11-22 DIAGNOSIS — R0902 Hypoxemia: Secondary | ICD-10-CM | POA: Diagnosis not present

## 2020-11-22 DIAGNOSIS — R278 Other lack of coordination: Secondary | ICD-10-CM | POA: Diagnosis not present

## 2020-11-22 DIAGNOSIS — N1832 Chronic kidney disease, stage 3b: Secondary | ICD-10-CM | POA: Diagnosis present

## 2020-11-22 DIAGNOSIS — Z20822 Contact with and (suspected) exposure to covid-19: Secondary | ICD-10-CM | POA: Diagnosis present

## 2020-11-22 DIAGNOSIS — I495 Sick sinus syndrome: Secondary | ICD-10-CM | POA: Diagnosis present

## 2020-11-22 DIAGNOSIS — I959 Hypotension, unspecified: Secondary | ICD-10-CM | POA: Diagnosis not present

## 2020-11-22 DIAGNOSIS — D631 Anemia in chronic kidney disease: Secondary | ICD-10-CM | POA: Diagnosis present

## 2020-11-22 DIAGNOSIS — E11319 Type 2 diabetes mellitus with unspecified diabetic retinopathy without macular edema: Secondary | ICD-10-CM | POA: Diagnosis not present

## 2020-11-22 DIAGNOSIS — Z7401 Bed confinement status: Secondary | ICD-10-CM | POA: Diagnosis not present

## 2020-11-22 DIAGNOSIS — M4326 Fusion of spine, lumbar region: Secondary | ICD-10-CM | POA: Diagnosis not present

## 2020-11-22 DIAGNOSIS — E785 Hyperlipidemia, unspecified: Secondary | ICD-10-CM | POA: Diagnosis not present

## 2020-11-22 DIAGNOSIS — Z79890 Hormone replacement therapy (postmenopausal): Secondary | ICD-10-CM | POA: Diagnosis not present

## 2020-11-22 DIAGNOSIS — M6281 Muscle weakness (generalized): Secondary | ICD-10-CM | POA: Diagnosis not present

## 2020-11-22 DIAGNOSIS — N390 Urinary tract infection, site not specified: Secondary | ICD-10-CM | POA: Diagnosis present

## 2020-11-22 DIAGNOSIS — E119 Type 2 diabetes mellitus without complications: Secondary | ICD-10-CM | POA: Diagnosis not present

## 2020-11-22 DIAGNOSIS — Z170 Estrogen receptor positive status [ER+]: Secondary | ICD-10-CM | POA: Diagnosis not present

## 2020-11-22 DIAGNOSIS — N183 Chronic kidney disease, stage 3 unspecified: Secondary | ICD-10-CM | POA: Diagnosis not present

## 2020-11-22 DIAGNOSIS — M549 Dorsalgia, unspecified: Secondary | ICD-10-CM | POA: Diagnosis not present

## 2020-11-22 DIAGNOSIS — D72829 Elevated white blood cell count, unspecified: Secondary | ICD-10-CM | POA: Diagnosis not present

## 2020-11-22 DIAGNOSIS — Z23 Encounter for immunization: Secondary | ICD-10-CM | POA: Diagnosis not present

## 2020-11-22 DIAGNOSIS — R2681 Unsteadiness on feet: Secondary | ICD-10-CM | POA: Diagnosis not present

## 2020-11-22 DIAGNOSIS — R7881 Bacteremia: Secondary | ICD-10-CM | POA: Diagnosis not present

## 2020-11-22 DIAGNOSIS — R531 Weakness: Secondary | ICD-10-CM | POA: Diagnosis not present

## 2020-11-22 DIAGNOSIS — Z79811 Long term (current) use of aromatase inhibitors: Secondary | ICD-10-CM | POA: Diagnosis not present

## 2020-11-22 DIAGNOSIS — Z741 Need for assistance with personal care: Secondary | ICD-10-CM | POA: Diagnosis not present

## 2020-11-22 DIAGNOSIS — L89152 Pressure ulcer of sacral region, stage 2: Secondary | ICD-10-CM | POA: Diagnosis present

## 2020-11-22 DIAGNOSIS — E113393 Type 2 diabetes mellitus with moderate nonproliferative diabetic retinopathy without macular edema, bilateral: Secondary | ICD-10-CM | POA: Diagnosis not present

## 2020-11-22 DIAGNOSIS — G479 Sleep disorder, unspecified: Secondary | ICD-10-CM | POA: Diagnosis not present

## 2020-11-22 DIAGNOSIS — R000 Tachycardia, unspecified: Secondary | ICD-10-CM | POA: Diagnosis not present

## 2020-11-22 DIAGNOSIS — Z794 Long term (current) use of insulin: Secondary | ICD-10-CM | POA: Diagnosis not present

## 2020-11-22 DIAGNOSIS — E1122 Type 2 diabetes mellitus with diabetic chronic kidney disease: Secondary | ICD-10-CM | POA: Diagnosis present

## 2020-11-22 DIAGNOSIS — I517 Cardiomegaly: Secondary | ICD-10-CM | POA: Diagnosis not present

## 2020-11-22 DIAGNOSIS — A4151 Sepsis due to Escherichia coli [E. coli]: Secondary | ICD-10-CM | POA: Diagnosis not present

## 2020-11-22 DIAGNOSIS — E669 Obesity, unspecified: Secondary | ICD-10-CM | POA: Diagnosis present

## 2020-11-22 DIAGNOSIS — A4159 Other Gram-negative sepsis: Secondary | ICD-10-CM | POA: Diagnosis present

## 2020-11-22 DIAGNOSIS — I5032 Chronic diastolic (congestive) heart failure: Secondary | ICD-10-CM | POA: Diagnosis present

## 2020-11-22 DIAGNOSIS — Z6836 Body mass index (BMI) 36.0-36.9, adult: Secondary | ICD-10-CM | POA: Diagnosis not present

## 2020-11-22 DIAGNOSIS — Z9889 Other specified postprocedural states: Secondary | ICD-10-CM | POA: Diagnosis not present

## 2020-11-22 DIAGNOSIS — E1139 Type 2 diabetes mellitus with other diabetic ophthalmic complication: Secondary | ICD-10-CM | POA: Diagnosis present

## 2020-11-22 DIAGNOSIS — Z66 Do not resuscitate: Secondary | ICD-10-CM | POA: Diagnosis present

## 2020-11-22 DIAGNOSIS — M1990 Unspecified osteoarthritis, unspecified site: Secondary | ICD-10-CM | POA: Diagnosis not present

## 2020-11-22 DIAGNOSIS — A419 Sepsis, unspecified organism: Secondary | ICD-10-CM | POA: Diagnosis not present

## 2020-11-22 DIAGNOSIS — C50919 Malignant neoplasm of unspecified site of unspecified female breast: Secondary | ICD-10-CM | POA: Diagnosis not present

## 2020-11-22 DIAGNOSIS — I130 Hypertensive heart and chronic kidney disease with heart failure and stage 1 through stage 4 chronic kidney disease, or unspecified chronic kidney disease: Secondary | ICD-10-CM | POA: Diagnosis present

## 2020-11-22 DIAGNOSIS — I4891 Unspecified atrial fibrillation: Secondary | ICD-10-CM | POA: Diagnosis not present

## 2020-11-22 DIAGNOSIS — G9341 Metabolic encephalopathy: Secondary | ICD-10-CM | POA: Diagnosis present

## 2020-11-22 DIAGNOSIS — M4316 Spondylolisthesis, lumbar region: Secondary | ICD-10-CM | POA: Diagnosis not present

## 2020-11-22 DIAGNOSIS — R4182 Altered mental status, unspecified: Secondary | ICD-10-CM | POA: Diagnosis not present

## 2020-11-22 DIAGNOSIS — R6520 Severe sepsis without septic shock: Secondary | ICD-10-CM | POA: Diagnosis present

## 2020-11-22 DIAGNOSIS — E872 Acidosis: Secondary | ICD-10-CM | POA: Diagnosis present

## 2020-11-22 DIAGNOSIS — R410 Disorientation, unspecified: Secondary | ICD-10-CM | POA: Diagnosis not present

## 2020-11-22 DIAGNOSIS — R262 Difficulty in walking, not elsewhere classified: Secondary | ICD-10-CM | POA: Diagnosis not present

## 2020-11-22 DIAGNOSIS — E039 Hypothyroidism, unspecified: Secondary | ICD-10-CM | POA: Diagnosis present

## 2020-11-22 LAB — GLUCOSE, CAPILLARY
Glucose-Capillary: 172 mg/dL — ABNORMAL HIGH (ref 70–99)
Glucose-Capillary: 114 mg/dL — ABNORMAL HIGH (ref 70–99)
Glucose-Capillary: 162 mg/dL — ABNORMAL HIGH (ref 70–99)

## 2020-11-22 MED ORDER — CYCLOBENZAPRINE HCL 10 MG PO TABS: 10.0000 mg | ORAL_TABLET | Freq: Three times a day (TID) | ORAL | 0 refills | Status: DC | PRN

## 2020-11-22 MED ORDER — OXYCODONE HCL 10 MG PO TABS: 10.0000 mg | ORAL_TABLET | Freq: Four times a day (QID) | ORAL | 0 refills | Status: DC | PRN

## 2020-11-22 MED ORDER — DOXYCYCLINE HYCLATE 100 MG PO TABS
100.0000 mg | ORAL_TABLET | Freq: Two times a day (BID) | ORAL | Status: DC
  Administered 2020-11-22: 100 mg via ORAL
  Filled 2020-11-22: qty 1

## 2020-11-22 MED ORDER — DOCUSATE SODIUM 100 MG PO CAPS: 100.0000 mg | ORAL_CAPSULE | Freq: Two times a day (BID) | ORAL | 0 refills | Status: DC

## 2020-11-22 MED ORDER — DOXYCYCLINE HYCLATE 100 MG PO TABS: 100.0000 mg | ORAL_TABLET | Freq: Two times a day (BID) | ORAL | 0 refills | Status: DC

## 2020-11-22 NOTE — Progress Notes (Signed)
Patient discharged to [facility] via [facility] with all belongings. IV removed. Dentures placed in mouth before leaving.

## 2020-11-22 NOTE — Progress Notes (Signed)
Called report to [facility] [phone]) gave report to Kim,RN who will be taking care of patient there. Nurse verbalized understanding of all teaching. Prescription printed and placed in folder with discharge paperwork to be taken with patient when she leaves. Pt currently waiting in room for PTAR pickup

## 2020-11-22 NOTE — Progress Notes (Signed)
OT Cancellation Note  Patient Details Name: Summer Hawkins MRN: [id number] DOB: June 11, 1934   Cancelled Treatment:    Reason Eval/Treat Not Completed: Fatigue/lethargy limiting ability to participate;Other (comment) (Upon arrival, pt with fatigue and lethargy after PT session. Pt also with decreased awareness, arousal, and mumbling during conversation.) Will return as schedule allows.   Jackquline Denmark, OTS Acute Rehab Office: [phone]   Summer Hawkins 11/22/2020, 9:39 AM

## 2020-11-22 NOTE — Discharge Instructions (Signed)
Wound Care Keep incision covered and dry for two days.    Do not put any creams, lotions, or ointments on incision.  Activity Walk each and every day, increasing distance each day. No lifting greater than 5 lbs.  Avoid excessive neck motion. No driving for 2 weeks; may ride as a passenger locally.  Diet Resume your normal diet.   Return to Work Will be discussed at your follow up appointment.  Call Your Doctor If Any of These Occur Redness, drainage, or swelling at the wound.  Temperature greater than 101 degrees. Severe pain not relieved by pain medication. Incision starts to come apart.  Follow Up Appt Call today for appointment in 2 weeks [phone]) or for problems.  If you have any hardware placed in your spine, you will need an x-ray before your appointment.

## 2020-11-22 NOTE — Progress Notes (Signed)
Physical Therapy Treatment Patient Details Name: Summer Hawkins MRN: [id number] DOB: 1934/04/10 Today's Date: 11/22/2020   History of Present Illness 86 year old female s/p L3-4, L4-5 fusion. Pt with PMH significant for A fib, breast ca, CKD stage III, DM, HTN, pacemaker, lumpectomy, RC repair.    PT Comments    Continuing work on functional mobility and activity tolerance;  Session focused on bed mobility and sitting balance; Decr ability to participate, but sat EOB with mod to max assist for apporx 5-8 minutes; ultimately unable to practice transfers bacuase of heavy peosterior lean in sitting; noted plans for dc to SNFfor rehab   Recommendations for follow up therapy are one component of a multi-disciplinary discharge planning process, led by the attending physician.  Recommendations may be updated based on patient status, additional functional criteria and insurance authorization.  Follow Up Recommendations  SNF;Supervision/Assistance - 24 hour     Equipment Recommendations  3in1 (PT)    Recommendations for Other Services       Precautions / Restrictions Precautions Precautions: Fall;Back Precaution Comments: Has had multiple falls at home, no bending, lifting, or twisting. Required Braces or Orthoses: Spinal Brace Spinal Brace: Lumbar corset     Mobility  Bed Mobility Overal bed mobility: Needs Assistance Bed Mobility: Sit to Sidelying;Rolling;Sidelying to Sit Rolling: Max assist Sidelying to sit: +2 for physical assistance;Max assist     Sit to sidelying: +2 for physical assistance;Total assist General bed mobility comments: Sleepy, but eyes open to voice, and initiated effort to reach for rail, then needing hand-over-hand assist to reach rail; Max A to pull into L sidelying; 2 person Max assist to elevate trunk to sit    Transfers                 General transfer comment: Heavy posterior lean; unable to try sit to stand today  Ambulation/Gait                  Stairs             Wheelchair Mobility    Modified Rankin (Stroke Patients Only)       Balance     Sitting balance-Leahy Scale: Poor Sitting balance - Comments: could not sit EOB without assist as pt leaning backwards needing assist throughout.                                    Cognition Arousal/Alertness: Lethargic (opened eyes to voice consistently; slightly more awake when sitting EOB) Behavior During Therapy: Flat affect;WFL for tasks assessed/performed Overall Cognitive Status: Impaired/Different from baseline Area of Impairment: Orientation;Memory;Following commands;Safety/judgement;Awareness;Problem solving                 Orientation Level: Disoriented to;Place;Time;Situation   Memory: Decreased recall of precautions Following Commands: Follows one step commands inconsistently Safety/Judgement: Decreased awareness of safety;Decreased awareness of deficits   Problem Solving: Slow processing;Decreased initiation;Difficulty sequencing;Requires verbal cues;Requires tactile cues General Comments: Initially did not verbalize much, but spoke more once EOB; not oriented, speaking about someone bring someone else home "half-dressed"; confabulated answers to questions; became sleepier with more time sitting up      Exercises      General Comments General comments (skin integrity, edema, etc.): Total assist to don brace sitting EOB      Pertinent Vitals/Pain Pain Assessment: Faces Faces Pain Scale: Hurts even more Pain Location: back Pain Descriptors / Indicators: Grimacing  Pain Intervention(s): Monitored during session    Home Living                      Prior Function            PT Goals (current goals can now be found in the care plan section) Acute Rehab PT Goals Patient Stated Goal: Unable to state PT Goal Formulation: Patient unable to participate in goal setting Time For Goal Achievement: 12/02/20 Potential  to Achieve Goals: Poor Progress towards PT goals: Not progressing toward goals - comment (decr ability to participate)    Frequency    Min 2X/week      PT Plan Current plan remains appropriate;Frequency needs to be updated    Co-evaluation              AM-PAC PT "6 Clicks" Mobility   Outcome Measure  Help needed turning from your back to your side while in a flat bed without using bedrails?: A Lot Help needed moving from lying on your back to sitting on the side of a flat bed without using bedrails?: Total Help needed moving to and from a bed to a chair (including a wheelchair)?: Total Help needed standing up from a chair using your arms (e.g., wheelchair or bedside chair)?: Total Help needed to walk in hospital room?: Total Help needed climbing 3-5 steps with a railing? : Total 6 Click Score: 7    End of Session Equipment Utilized During Treatment: Back brace Activity Tolerance: Patient limited by lethargy;Patient limited by fatigue Patient left: in chair;with call bell/phone within reach;with bed alarm set;Other (comment) (bed into semi-chair position) Nurse Communication: Mobility status;Need for lift equipment;Patient requests pain meds (bloody dressing noted and called nrusing to look at it) PT Visit Diagnosis: Muscle weakness (generalized) (M62.81);Unsteadiness on feet (R26.81);Other abnormalities of gait and mobility (R26.89);Repeated falls (R29.6);History of falling (Z91.81);Pain Pain - part of body:  (Back)     Time: [id number] PT Time Calculation (min) (ACUTE ONLY): 20 min  Charges:  $Therapeutic Activity: 8-22 mins                     Roney Marion, PT  Acute Rehabilitation Services Pager [phone] Office [place] 11/22/2020, 4:34 PM

## 2020-11-22 NOTE — Progress Notes (Signed)
Subjective: The patient is somnolent but arousable.  She complains of back pain.  Objective: Vital signs in last 24 hours: Temp:  [97.5 F (36.4 C)-98.4 F (36.9 C)] 98.4 F (36.9 C) (09/12 0420) Pulse Rate:  [70-82] 82 (09/12 0420) Resp:  [16-25] 20 (09/12 0420) BP: (118-153)/(68-88) 118/88 (09/12 0420) SpO2:  [96 %-97 %] 97 % (09/12 0420) Estimated body mass index is 32.27 kg/m as calculated from the following:   Height as of this encounter: '5\' 4"'$  (1.626 m).   Weight as of this encounter: 85.3 kg.   Intake/Output from previous day: 09/11 0701 - 09/12 0700 In: 633 [P.O.:630; I.V.:3] Out: 1500 [Urine:1500] Intake/Output this shift: No intake/output data recorded.  Physical exam the patient is somnolent but easily arousable.  Her strength is normal.  The patient's lumbar wound dressing has some blood staining.  Lab Results: No results for input(s): WBC, HGB, HCT, PLT in the last 72 hours. BMET No results for input(s): NA, K, CL, CO2, GLUCOSE, BUN, CREATININE, CALCIUM in the last 72 hours.  Studies/Results: No results found.  Assessment/Plan: Postop day #5: We are awaiting skilled nursing facility placement.  I will start empiric doxycycline because of her wound drainage as she is allergic to penicillin.  LOS: 5 days     [name] 11/22/2020, 8:19 AM     Patient ID: Summer Hawkins, female   DOB: [birth year], 86 y.o.   MRN: [id number]

## 2020-11-22 NOTE — TOC Progression Note (Signed)
Transition of Care [facility]) - Progression Note    Patient Details  Name: JAJAIRA BOHLMAN MRN: [id number] Date of Birth: 1935-02-16  Transition of Care [facility]) CM/SW Contact  Emeterio Reeve, [place] Phone Number: 11/22/2020, 1:31 PM  Clinical Narrative:     Pt is ready for discharge to [facility]. CSW is waiting on DC summary from MD. CSW has sent secure message in epic and called office and left message with Network engineer.   Expected Discharge Plan: Skilled Nursing Facility Barriers to Discharge: SNF Pending bed offer  Expected Discharge Plan and Services Expected Discharge Plan: [place] In-house Referral: Clinical Social Work   Post Acute Care Choice: [place] Living arrangements for the past 2 months: [place] (pt recently at [facility]) Expected Discharge Date: 11/22/20                                     Social Determinants of Health (SDOH) Interventions    Readmission Risk Interventions No flowsheet data found.  Emeterio Reeve, LCSW Clinical Social Worker

## 2020-11-22 NOTE — TOC Transition Note (Signed)
Transition of Care [facility]) - CM/SW Discharge Note   Patient Details  Name: LYANNE SCHUTTER MRN: [id number] Date of Birth: January 09, 1935  Transition of Care [facility]) CM/SW Contact:  Emeterio Reeve, LCSW Phone Number: 11/22/2020, 2:01 PM   Clinical Narrative:     Patient will DC to: [facility] Anticipated DC date: 11/22/20 Transport by: Corey Harold     Per MD patient ready for DC to [facility]. RN, patient, patient's family, and facility notified of DC. Discharge Summary and FL2 sent to facility. DC packet on chart. Insurance [name] has been received and pt is covid negative. Ambulance transport requested for patient.    RN to call report to [phone].  CSW will sign off for now as social work intervention is no longer needed. Please consult [place] again if new needs arise.   Final next level of care: Skilled Nursing Facility Barriers to Discharge: Barriers Resolved   Patient Goals and CMS Choice Patient states their goals for this hospitalization and ongoing recovery are:: no more pain CMS Medicare.gov Compare Post Acute Care list provided to:: Patient Choice offered to / list presented to : Patient  Discharge Placement              Patient chooses bed at: [facility] Patient to be transferred to facility by: ptar   Patient and family notified of of transfer: 11/22/20  Discharge Plan and Services In-house Referral: Clinical Social Work   Post Acute Care Choice: [place]                               Social Determinants of Health (SDOH) Interventions     Readmission Risk Interventions No flowsheet data found.   Emeterio Reeve, LCSW Clinical Social Worker

## 2020-11-22 NOTE — Care Management Important Message (Signed)
Important Message  Patient Details  Name: Summer Hawkins MRN: [id number] Date of Birth: 1934-08-12   Medicare Important Message Given:  Yes     [name] [address] 11/22/2020, 4:05 PM

## 2020-11-22 NOTE — Discharge Summary (Signed)
Physician Discharge Summary     Providing Compassionate, Quality Care - Together   Patient ID: Summer Hawkins MRN: [id number] DOB/AGE: 07/15/1934 86 y.o.  Admit date: 11/17/2020 Discharge date: 11/22/2020  Admission Diagnoses: Spondylolisthesis, lumbar region  Discharge Diagnoses:  Active Problems:   Spondylolisthesis, lumbar region   Discharged Condition: good  Hospital Course: Patient underwent a two level PLIF by Dr. Arnoldo Morale on 11/17/2020. She was admitted to the hospital following recovery from anesthesia in the PACU. She has had some issues with mobilizing and bleeding from her surgical wound during her admission. She has been started on doxycycline empirically. She has worked with both physical and occupational therapies who feel the patient is ready for discharge to a skilled nursing facility to further work on building her strength and endurance. She is tolerating a normal diet. She is not having any bowel or bladder dysfunction. Her pain is reasonably controlled with oral pain medication. She is ready for discharge to the skilled nursing facility.   Consults: rehabilitation medicine  Significant Diagnostic Studies: radiology: DG Lumbar Spine 2-3 Views  Result Date: 11/17/2020 CLINICAL DATA:  Posterior lumbar interbody fusion. EXAM: LUMBAR SPINE - 2-3 VIEW COMPARISON:  Radiograph 10/28/2020 confirming 5 lumbar type vertebra. FINDINGS: Two fluoroscopic spot views obtained in the operating room. Intrapedicular screws at L3 through L5 with interbody spacers in place. Fluoroscopy time 17 seconds. Dose 14.06 mGy. IMPRESSION: Fluoroscopic spot views during L3-L5 fusion. Electronically Signed   By: Keith Rake M.D.   On: 11/17/2020 19:15   DG Lumbar Spine Complete  Result Date: 10/28/2020 CLINICAL DATA:  Fall EXAM: LUMBAR SPINE - COMPLETE 4+ VIEW COMPARISON:  Lumbar spine MRI 10/05/2020 FINDINGS: There are 5 non-rib-bearing lumbar type vertebral bodies. Vertebral body heights are  preserved. Alignment is normal. There is no evidence of spondylolysis. Facet arthropathy is most advanced at L4-L5 and L5-S1. There is calcified atherosclerotic plaque of the aortic arch. Cholecystectomy clips are noted. Cardiac device leads are partially imaged. IMPRESSION: No acute findings. Electronically Signed   By: Valetta Mole M.D.   On: 10/28/2020 15:30   MR LUMBAR SPINE WO CONTRAST  Result Date: 10/06/2020 CLINICAL DATA:  Spondylolisthesis, lumbar. EXAM: MRI LUMBAR SPINE WITHOUT CONTRAST TECHNIQUE: Multiplanar, multisequence MR imaging of the lumbar spine was performed. No intravenous contrast was administered. COMPARISON:  None. FINDINGS: Segmentation:  Standard. Alignment:  Physiologic. Vertebrae:  No fracture, evidence of discitis, or bone lesion. Conus medullaris and cauda equina: Conus extends to the L1-2 level. Conus and cauda equina appear normal. Paraspinal and other soft tissues: Duodenal diverticulum incidentally noted. Pseudomass appearance in the interpolar right kidney which was worked up by ultrasound and CT in 2016. No change in morphology when CT is compared with T1 weighted imaging. Atrophy of intrinsic back muscles. Disc levels: T12- L1: Unremarkable for age L1-L2: Unremarkable for age L2-L3: Facet spurring and mild disc bulging. Mild ligamentum flavum thickening. Mild spinal stenosis L3-L4: Facet osteoarthritis with spurring and ligamentum flavum thickening. Disc is narrowed and bulging with central to left paracentral protrusion. High-grade and compressive spinal stenosis L4-L5: Disc narrowing and bulging. Degenerative facet spurring and ligamentum flavum thickening. Moderate spinal stenosis with impingement on both L5 nerve roots at the subarticular recesses L5-S1:Disc narrowing and bulging with central protrusion. Mild facet spurring. No neural compression IMPRESSION: 1. Lumbar spine degeneration especially affecting the L3-4 and L4-5 levels. 2. L3-4 high-grade spinal stenosis from  hypertrophic facets and disc protrusion. 3. L4-5 moderate spinal stenosis with bilateral L5 impingement at the subarticular  recesses. Electronically Signed   By: Monte Fantasia M.D.   On: 10/06/2020 11:41   DG Lumbar Spine 1 View  Result Date: 11/17/2020 CLINICAL DATA:  Elective surgery, obtained in the operating room. EXAM: LUMBAR SPINE - 1 VIEW COMPARISON:  10/28/2020 confirming 5 lumbar type vertebra. FINDINGS: Cross-table lateral view of the lumbar spine obtained in the operating room. Surgical instruments localize posterior to the L4-L5 disc space projecting over the posterior elements. IMPRESSION: Surgical instruments posterior to the L4-L5 disc space. Electronically Signed   By: Keith Rake M.D.   On: 11/17/2020 19:14   MR HIP LEFT WO CONTRAST  Result Date: 10/21/2020 CLINICAL DATA:  Left hip pain. EXAM: MR OF THE LEFT HIP WITHOUT CONTRAST TECHNIQUE: Multiplanar, multisequence MR imaging was performed. No intravenous contrast was administered. COMPARISON:  Radiographs 08/31/2020 FINDINGS: Both hips are normally located. Severe/advanced degenerative changes involving the left hip with full-thickness cartilage loss, joint space narrowing, osteophytic spurring and subchondral cystic change. No stress fracture or AVN. Small bilateral hip joint effusions. The pubic symphysis and SI joints are intact. No pelvic fractures or bone lesions. Mild bilateral peritendinitis without trochanteric bursitis. Minimal areas of edema like signal changes in the left gluteal muscles. Reflect a muscle strain partial tear. The adductor muscles are unremarkable. No significant intrapelvic abnormalities are identified. IMPRESSION: 1. Severe/advanced degenerative changes involving the left hip. 2. No stress fracture or AVN. 3. Mild bilateral peritendinitis without trochanteric bursitis. Electronically Signed   By: Marijo Sanes M.D.   On: 10/21/2020 14:51   DG C-Arm 1-60 Min-No Report  Result Date:  11/17/2020 Fluoroscopy was utilized by the requesting physician.  No radiographic interpretation.   CUP PACEART INCLINIC DEVICE CHECK  Result Date: 09/16/2020 Pacemaker check in clinic. Normal device function. Thresholds, sensing, impedances consistent with previous measurements. Device programmed to maximize longevity. 1 episode appears NSVT vs SVT (RA is HIS bundle) Device programmed at appropriate safety margins. RV does not pace. RV threshold 1.75V @ 0.4 ms, 1.25V @ 0.6 ms. Auto threshold and pulse width on so no changes today. Histogram distribution appropriate for patient activity level. Estimated longevity 10 yr, 3 mo. Patient enrolled in remote follow-up/TTM's with [employer]. Patient education completed.  CUP PACEART REMOTE DEVICE CHECK  Result Date: 08/31/2020 Scheduled remote reviewed. Normal device function.  Next remote 91 days. HB  DG Hip Unilat W or Wo Pelvis 2-3 Views Left  Result Date: 10/28/2020 CLINICAL DATA:  Fall EXAM: DG HIP (WITH OR WITHOUT PELVIS) 2-3V LEFT COMPARISON:  Hip radiographs 08/31/2020, hip MRI 10/21/2020 FINDINGS: There is no acute fracture or dislocation. Femoroacetabular alignment is normal bilaterally. There are severe degenerative changes in the left hip, stable. There are more mild degenerative changes of the right hip. The SI joints and symphysis pubis are intact. The soft tissues are unremarkable. IMPRESSION: 1. No acute fracture or dislocation. 2. Severe degenerative changes of the left hip, stable Electronically Signed   By: Valetta Mole M.D.   On: 10/28/2020 15:28     Treatments: surgery: Bilateral L3-4 and L4-5 laminotomy/foraminotomies/medial facetectomy to decompress the bilateral L3, L4 and L5 nerve roots(the work required to do this was in addition to the work required to do the posterior lumbar interbody fusion because of the patient's spinal stenosis, facet arthropathy. Etc. requiring a wide decompression of the nerve roots.);  L3-4 and L4-5  transforaminal lumbar interbody fusion with Proteo Os, local morselized autograft bone and Zimmer DBM; insertion of interbody prosthesis at L3-4 and L4-5 (globus peek expandable  interbody prosthesis); posterior segmental instrumentation from L3 to L5 with globus titanium pedicle screws and rods; posterior lateral arthrodesis at L3-4 and L4-5 with Proteo OS, local morselized autograft bone and Zimmer DBM.  Discharge Exam: Blood pressure (!) 129/57, pulse 70, temperature 98.5 F (36.9 C), temperature source Oral, resp. rate 16, height '5\' 4"'$  (1.626 m), weight 85.3 kg, SpO2 95 %.  Alert and oriented to self and place PERRLA CN II-XII grossly intact MAE, Strength and sensation intact Incision is covered with dressing, which has a small amount of dried sanguinous drainage   Disposition: Discharge disposition: 03-Skilled Nursing Facility        Allergies as of 11/22/2020       Reactions   Bee Venom Shortness Of Breath, Nausea And Vomiting, Other (See Comments)   Makes the patient feel faint, also   Penicillins Anaphylaxis, Hives, Swelling, Other (See Comments)   Has patient had a PCN reaction causing immediate rash, facial/tongue/throat swelling, SOB or lightheadedness with hypotension: Yes Has patient had a PCN reaction causing severe rash involving mucus membranes or skin necrosis: No Has patient had a PCN reaction that required hospitalization: No Has patient had a PCN reaction occurring within the last 10 years: No If all of the above answers are "NO", then may proceed with Cephalosporin use.   Liraglutide    Other reaction(s): Rash at injection site   Lisinopril    Other reaction(s): cough   Zocor [simvastatin]    Other reaction(s): memory changes        Medication List     STOP taking these medications    traMADol 50 MG tablet Commonly known as: ULTRAM       TAKE these medications    amLODipine 5 MG tablet Commonly known as: NORVASC Take 5 mg by mouth daily.    anastrozole 1 MG tablet Commonly known as: ARIMIDEX Take 1 tablet (1 mg total) by mouth daily.   apixaban 5 MG Tabs tablet Commonly known as: Eliquis Take 1 tablet (5 mg total) by mouth 2 (two) times daily.   cyclobenzaprine 10 MG tablet Commonly known as: FLEXERIL Take 1 tablet (10 mg total) by mouth 3 (three) times daily as needed for muscle spasms.   diphenhydramine-acetaminophen 25-500 MG Tabs tablet Commonly known as: TYLENOL PM Take 1 tablet by mouth at bedtime as needed (for sleep).   docusate sodium 100 MG capsule Commonly known as: COLACE Take 1 capsule (100 mg total) by mouth 2 (two) times daily.   doxycycline 100 MG tablet Commonly known as: VIBRA-TABS Take 1 tablet (100 mg total) by mouth every 12 (twelve) hours.   ferrous sulfate 325 (65 FE) MG tablet Take 325 mg by mouth daily with breakfast.   irbesartan 300 MG tablet Commonly known as: AVAPRO Take 300 mg by mouth daily.   levothyroxine 137 MCG tablet Commonly known as: SYNTHROID Take 137 mcg by mouth daily before breakfast.   metFORMIN 850 MG tablet Commonly known as: GLUCOPHAGE Take 850 mg by mouth 2 (two) times daily with a meal.   metoprolol succinate 25 MG 24 hr tablet Commonly known as: Toprol XL Take 1 tablet (25 mg total) by mouth daily.   multivitamin with minerals tablet Take 1 tablet by mouth daily.   Oxycodone HCl 10 MG Tabs Take 1 tablet (10 mg total) by mouth every 6 (six) hours as needed for severe pain ((score 7 to 10)).   spironolactone 25 MG tablet Commonly known as: ALDACTONE Take 25 mg by mouth daily.  Prescribed by Derm for hair growth per pt.   timolol 0.5 % ophthalmic solution Commonly known as: TIMOPTIC 1 drop every morning.   Toujeo SoloStar 300 UNIT/ML Solostar Pen Generic drug: insulin glargine (1 Unit Dial) Inject 10-15 Units into the skin See admin instructions. Inject 15 units into the skin in the morning before breakfast and 10 units at bedtime         Follow-up Information     Newman Pies, MD. Schedule an appointment as soon as possible for a visit in 2 week(s).   Specialty: Neurosurgery Contact information: [address] [place] [address] [phone]                 Signed: Viona Gilmore, DNP, AGNP-C Nurse Practitioner  [facility] [place] [address], [place], [place], [place] [address] P: [phone]    F: [phone]  11/22/2020, 1:32 PM

## 2020-11-23 DIAGNOSIS — M4326 Fusion of spine, lumbar region: Secondary | ICD-10-CM | POA: Diagnosis not present

## 2020-11-23 DIAGNOSIS — E039 Hypothyroidism, unspecified: Secondary | ICD-10-CM | POA: Diagnosis not present

## 2020-11-23 DIAGNOSIS — I4891 Unspecified atrial fibrillation: Secondary | ICD-10-CM | POA: Diagnosis not present

## 2020-11-23 DIAGNOSIS — N183 Chronic kidney disease, stage 3 unspecified: Secondary | ICD-10-CM | POA: Diagnosis not present

## 2020-11-23 DIAGNOSIS — D649 Anemia, unspecified: Secondary | ICD-10-CM | POA: Diagnosis not present

## 2020-11-23 DIAGNOSIS — E11319 Type 2 diabetes mellitus with unspecified diabetic retinopathy without macular edema: Secondary | ICD-10-CM | POA: Diagnosis not present

## 2020-11-23 DIAGNOSIS — I5030 Unspecified diastolic (congestive) heart failure: Secondary | ICD-10-CM | POA: Diagnosis not present

## 2020-11-23 DIAGNOSIS — G479 Sleep disorder, unspecified: Secondary | ICD-10-CM | POA: Diagnosis not present

## 2020-11-23 DIAGNOSIS — M1990 Unspecified osteoarthritis, unspecified site: Secondary | ICD-10-CM | POA: Diagnosis not present

## 2020-11-23 DIAGNOSIS — I10 Essential (primary) hypertension: Secondary | ICD-10-CM | POA: Diagnosis not present

## 2020-11-23 DIAGNOSIS — E785 Hyperlipidemia, unspecified: Secondary | ICD-10-CM | POA: Diagnosis not present

## 2020-11-23 DIAGNOSIS — C50919 Malignant neoplasm of unspecified site of unspecified female breast: Secondary | ICD-10-CM | POA: Diagnosis not present

## 2020-11-24 DIAGNOSIS — I4891 Unspecified atrial fibrillation: Secondary | ICD-10-CM | POA: Diagnosis not present

## 2020-11-24 DIAGNOSIS — I5030 Unspecified diastolic (congestive) heart failure: Secondary | ICD-10-CM | POA: Diagnosis not present

## 2020-11-24 DIAGNOSIS — M4326 Fusion of spine, lumbar region: Secondary | ICD-10-CM | POA: Diagnosis not present

## 2020-11-25 ENCOUNTER — Telehealth: Payer: Self-pay | Admitting: *Deleted

## 2020-11-25 ENCOUNTER — Inpatient Hospital Stay (HOSPITAL_COMMUNITY)
Admission: EM | Admit: 2020-11-25 | Discharge: 2020-12-01 | DRG: 871 | Disposition: A | Discharge status: Skilled Nursing Facility | Payer: Medicare Other | Source: Skilled Nursing Facility | Attending: Internal Medicine | Admitting: Internal Medicine

## 2020-11-25 ENCOUNTER — Encounter (HOSPITAL_COMMUNITY): Payer: Self-pay | Admitting: *Deleted

## 2020-11-25 ENCOUNTER — Emergency Department (HOSPITAL_COMMUNITY): Payer: Medicare Other

## 2020-11-25 ENCOUNTER — Other Ambulatory Visit: Payer: Self-pay

## 2020-11-25 DIAGNOSIS — Z66 Do not resuscitate: Secondary | ICD-10-CM | POA: Diagnosis not present

## 2020-11-25 DIAGNOSIS — Z91030 Bee allergy status: Secondary | ICD-10-CM

## 2020-11-25 DIAGNOSIS — N1832 Chronic kidney disease, stage 3b: Secondary | ICD-10-CM | POA: Diagnosis present

## 2020-11-25 DIAGNOSIS — Z950 Presence of cardiac pacemaker: Secondary | ICD-10-CM

## 2020-11-25 DIAGNOSIS — I517 Cardiomegaly: Secondary | ICD-10-CM | POA: Diagnosis not present

## 2020-11-25 DIAGNOSIS — I5032 Chronic diastolic (congestive) heart failure: Secondary | ICD-10-CM | POA: Diagnosis present

## 2020-11-25 DIAGNOSIS — R7989 Other specified abnormal findings of blood chemistry: Secondary | ICD-10-CM

## 2020-11-25 DIAGNOSIS — R4182 Altered mental status, unspecified: Secondary | ICD-10-CM

## 2020-11-25 DIAGNOSIS — Z23 Encounter for immunization: Secondary | ICD-10-CM | POA: Diagnosis not present

## 2020-11-25 DIAGNOSIS — I130 Hypertensive heart and chronic kidney disease with heart failure and stage 1 through stage 4 chronic kidney disease, or unspecified chronic kidney disease: Secondary | ICD-10-CM | POA: Diagnosis present

## 2020-11-25 DIAGNOSIS — D72829 Elevated white blood cell count, unspecified: Secondary | ICD-10-CM | POA: Diagnosis not present

## 2020-11-25 DIAGNOSIS — Z90710 Acquired absence of both cervix and uterus: Secondary | ICD-10-CM

## 2020-11-25 DIAGNOSIS — H42 Glaucoma in diseases classified elsewhere: Secondary | ICD-10-CM | POA: Diagnosis present

## 2020-11-25 DIAGNOSIS — M549 Dorsalgia, unspecified: Secondary | ICD-10-CM

## 2020-11-25 DIAGNOSIS — R441 Visual hallucinations: Secondary | ICD-10-CM | POA: Diagnosis present

## 2020-11-25 DIAGNOSIS — N390 Urinary tract infection, site not specified: Secondary | ICD-10-CM | POA: Diagnosis present

## 2020-11-25 DIAGNOSIS — I482 Chronic atrial fibrillation, unspecified: Secondary | ICD-10-CM | POA: Diagnosis present

## 2020-11-25 DIAGNOSIS — R6520 Severe sepsis without septic shock: Secondary | ICD-10-CM | POA: Diagnosis present

## 2020-11-25 DIAGNOSIS — Z20822 Contact with and (suspected) exposure to covid-19: Secondary | ICD-10-CM | POA: Diagnosis present

## 2020-11-25 DIAGNOSIS — Z7984 Long term (current) use of oral hypoglycemic drugs: Secondary | ICD-10-CM

## 2020-11-25 DIAGNOSIS — Z9049 Acquired absence of other specified parts of digestive tract: Secondary | ICD-10-CM

## 2020-11-25 DIAGNOSIS — H353130 Nonexudative age-related macular degeneration, bilateral, stage unspecified: Secondary | ICD-10-CM | POA: Diagnosis present

## 2020-11-25 DIAGNOSIS — Z8249 Family history of ischemic heart disease and other diseases of the circulatory system: Secondary | ICD-10-CM

## 2020-11-25 DIAGNOSIS — D631 Anemia in chronic kidney disease: Secondary | ICD-10-CM | POA: Diagnosis present

## 2020-11-25 DIAGNOSIS — E669 Obesity, unspecified: Secondary | ICD-10-CM | POA: Diagnosis present

## 2020-11-25 DIAGNOSIS — Z981 Arthrodesis status: Secondary | ICD-10-CM

## 2020-11-25 DIAGNOSIS — R509 Fever, unspecified: Secondary | ICD-10-CM

## 2020-11-25 DIAGNOSIS — A419 Sepsis, unspecified organism: Secondary | ICD-10-CM | POA: Diagnosis present

## 2020-11-25 DIAGNOSIS — E1122 Type 2 diabetes mellitus with diabetic chronic kidney disease: Secondary | ICD-10-CM | POA: Diagnosis present

## 2020-11-25 DIAGNOSIS — R7881 Bacteremia: Secondary | ICD-10-CM

## 2020-11-25 DIAGNOSIS — L89152 Pressure ulcer of sacral region, stage 2: Secondary | ICD-10-CM | POA: Diagnosis present

## 2020-11-25 DIAGNOSIS — E875 Hyperkalemia: Secondary | ICD-10-CM | POA: Diagnosis present

## 2020-11-25 DIAGNOSIS — I4891 Unspecified atrial fibrillation: Secondary | ICD-10-CM | POA: Diagnosis not present

## 2020-11-25 DIAGNOSIS — J9690 Respiratory failure, unspecified, unspecified whether with hypoxia or hypercapnia: Secondary | ICD-10-CM | POA: Diagnosis not present

## 2020-11-25 DIAGNOSIS — Z794 Long term (current) use of insulin: Secondary | ICD-10-CM

## 2020-11-25 DIAGNOSIS — E872 Acidosis: Secondary | ICD-10-CM | POA: Diagnosis present

## 2020-11-25 DIAGNOSIS — Z880 Allergy status to penicillin: Secondary | ICD-10-CM

## 2020-11-25 DIAGNOSIS — A4151 Sepsis due to Escherichia coli [E. coli]: Secondary | ICD-10-CM | POA: Diagnosis not present

## 2020-11-25 DIAGNOSIS — I495 Sick sinus syndrome: Secondary | ICD-10-CM | POA: Diagnosis present

## 2020-11-25 DIAGNOSIS — Z79890 Hormone replacement therapy (postmenopausal): Secondary | ICD-10-CM

## 2020-11-25 DIAGNOSIS — E785 Hyperlipidemia, unspecified: Secondary | ICD-10-CM | POA: Diagnosis present

## 2020-11-25 DIAGNOSIS — R531 Weakness: Secondary | ICD-10-CM | POA: Diagnosis not present

## 2020-11-25 DIAGNOSIS — I10 Essential (primary) hypertension: Secondary | ICD-10-CM | POA: Diagnosis present

## 2020-11-25 DIAGNOSIS — Z9889 Other specified postprocedural states: Secondary | ICD-10-CM

## 2020-11-25 DIAGNOSIS — Z803 Family history of malignant neoplasm of breast: Secondary | ICD-10-CM

## 2020-11-25 DIAGNOSIS — N183 Chronic kidney disease, stage 3 unspecified: Secondary | ICD-10-CM | POA: Diagnosis present

## 2020-11-25 DIAGNOSIS — B964 Proteus (mirabilis) (morganii) as the cause of diseases classified elsewhere: Secondary | ICD-10-CM | POA: Diagnosis present

## 2020-11-25 DIAGNOSIS — Z79899 Other long term (current) drug therapy: Secondary | ICD-10-CM

## 2020-11-25 DIAGNOSIS — B961 Klebsiella pneumoniae [K. pneumoniae] as the cause of diseases classified elsewhere: Secondary | ICD-10-CM | POA: Diagnosis present

## 2020-11-25 DIAGNOSIS — G9341 Metabolic encephalopathy: Secondary | ICD-10-CM | POA: Diagnosis not present

## 2020-11-25 DIAGNOSIS — Z833 Family history of diabetes mellitus: Secondary | ICD-10-CM

## 2020-11-25 DIAGNOSIS — E039 Hypothyroidism, unspecified: Secondary | ICD-10-CM | POA: Diagnosis present

## 2020-11-25 DIAGNOSIS — M4326 Fusion of spine, lumbar region: Secondary | ICD-10-CM | POA: Diagnosis not present

## 2020-11-25 DIAGNOSIS — Z888 Allergy status to other drugs, medicaments and biological substances status: Secondary | ICD-10-CM

## 2020-11-25 DIAGNOSIS — Z79811 Long term (current) use of aromatase inhibitors: Secondary | ICD-10-CM

## 2020-11-25 DIAGNOSIS — E1139 Type 2 diabetes mellitus with other diabetic ophthalmic complication: Secondary | ICD-10-CM | POA: Diagnosis present

## 2020-11-25 DIAGNOSIS — N184 Chronic kidney disease, stage 4 (severe): Secondary | ICD-10-CM | POA: Diagnosis present

## 2020-11-25 DIAGNOSIS — A4159 Other Gram-negative sepsis: Secondary | ICD-10-CM | POA: Diagnosis not present

## 2020-11-25 DIAGNOSIS — E119 Type 2 diabetes mellitus without complications: Secondary | ICD-10-CM

## 2020-11-25 DIAGNOSIS — Z7901 Long term (current) use of anticoagulants: Secondary | ICD-10-CM

## 2020-11-25 DIAGNOSIS — Z6836 Body mass index (BMI) 36.0-36.9, adult: Secondary | ICD-10-CM

## 2020-11-25 DIAGNOSIS — C50412 Malignant neoplasm of upper-outer quadrant of left female breast: Secondary | ICD-10-CM | POA: Diagnosis present

## 2020-11-25 LAB — CBC
HCT: 36.1 % (ref 36.0–46.0)
Hemoglobin: 11.5 g/dL — ABNORMAL LOW (ref 12.0–15.0)
MCH: 32.2 pg (ref 26.0–34.0)
MCHC: 31.9 g/dL (ref 30.0–36.0)
MCV: 101.1 fL — ABNORMAL HIGH (ref 80.0–100.0)
Platelets: 364 10*3/uL (ref 150–400)
RBC: 3.57 MIL/uL — ABNORMAL LOW (ref 3.87–5.11)
RDW: 14.1 % (ref 11.5–15.5)
WBC: 22.6 10*3/uL — ABNORMAL HIGH (ref 4.0–10.5)
nRBC: 0 % (ref 0.0–0.2)

## 2020-11-25 LAB — AMMONIA: Ammonia: 17 umol/L (ref 9–35)

## 2020-11-25 LAB — COMPREHENSIVE METABOLIC PANEL
ALT: 52 U/L — ABNORMAL HIGH (ref 0–44)
AST: 29 U/L (ref 15–41)
Albumin: 2.9 g/dL — ABNORMAL LOW (ref 3.5–5.0)
Alkaline Phosphatase: 94 U/L (ref 38–126)
Anion gap: 12 (ref 5–15)
BUN: 33 mg/dL — ABNORMAL HIGH (ref 8–23)
CO2: 20 mmol/L — ABNORMAL LOW (ref 22–32)
Calcium: 8.9 mg/dL (ref 8.9–10.3)
Chloride: 102 mmol/L (ref 98–111)
Creatinine, Ser: 1.69 mg/dL — ABNORMAL HIGH (ref 0.44–1.00)
GFR, Estimated: 30 mL/min — ABNORMAL LOW (ref >60)
Glucose, Bld: 275 mg/dL — ABNORMAL HIGH (ref 70–99)
Potassium: 5.1 mmol/L (ref 3.5–5.1)
Sodium: 134 mmol/L — ABNORMAL LOW (ref 135–145)
Total Bilirubin: 1.1 mg/dL (ref 0.3–1.2)
Total Protein: 6.6 g/dL (ref 6.5–8.1)

## 2020-11-25 LAB — URINALYSIS, ROUTINE W REFLEX MICROSCOPIC
Bilirubin Urine: NEGATIVE
Glucose, UA: NEGATIVE mg/dL
Hgb urine dipstick: NEGATIVE
Ketones, ur: NEGATIVE mg/dL
Nitrite: NEGATIVE
Protein, ur: 30 mg/dL — AB
Specific Gravity, Urine: 1.016 (ref 1.005–1.030)
pH: 5 (ref 5.0–8.0)

## 2020-11-25 LAB — RESP PANEL BY RT-PCR (FLU A&B, COVID) ARPGX2
Influenza A by PCR: NEGATIVE
Influenza B by PCR: NEGATIVE
SARS Coronavirus 2 by RT PCR: NEGATIVE

## 2020-11-25 LAB — CBG MONITORING, ED: Glucose-Capillary: 242 mg/dL — ABNORMAL HIGH (ref 70–99)

## 2020-11-25 LAB — HEMOGLOBIN A1C
Hgb A1c MFr Bld: 7 % — ABNORMAL HIGH (ref 4.8–5.6)
Mean Plasma Glucose: 154.2 mg/dL

## 2020-11-25 LAB — LACTIC ACID, PLASMA
Lactic Acid, Venous: 2.7 mmol/L (ref 0.5–1.9)
Lactic Acid, Venous: 2.3 mmol/L (ref 0.5–1.9)

## 2020-11-25 LAB — TSH: TSH: 0.95 u[IU]/mL (ref 0.350–4.500)

## 2020-11-25 MED ORDER — SODIUM CHLORIDE 0.9 % IV SOLN
1.0000 g | Freq: Once | INTRAVENOUS | Status: AC
  Administered 2020-11-25: 1 g via INTRAVENOUS
  Filled 2020-11-25: qty 1

## 2020-11-25 MED ORDER — LACTATED RINGERS IV BOLUS
1600.0000 mL | Freq: Once | INTRAVENOUS | Status: AC
  Administered 2020-11-25: 1600 mL via INTRAVENOUS

## 2020-11-25 MED ORDER — ACETAMINOPHEN 325 MG PO TABS
650.0000 mg | ORAL_TABLET | Freq: Four times a day (QID) | ORAL | Status: DC | PRN
  Administered 2020-11-26: 650 mg via ORAL
  Filled 2020-11-25: qty 2

## 2020-11-25 MED ORDER — INSULIN DETEMIR 100 UNIT/ML ~~LOC~~ SOLN
5.0000 [IU] | Freq: Every day | SUBCUTANEOUS | Status: DC
  Administered 2020-11-25 – 2020-11-27 (×3): 5 [IU] via SUBCUTANEOUS
  Filled 2020-11-25 (×5): qty 0.05

## 2020-11-25 MED ORDER — SODIUM CHLORIDE 0.9 % IV SOLN: INTRAVENOUS | Status: AC

## 2020-11-25 MED ORDER — SODIUM CHLORIDE 0.9% FLUSH: 3.0000 mL | INTRAVENOUS | Status: DC | PRN

## 2020-11-25 MED ORDER — ACETAMINOPHEN 650 MG RE SUPP: 650.0000 mg | Freq: Four times a day (QID) | RECTAL | Status: DC | PRN

## 2020-11-25 MED ORDER — SODIUM CHLORIDE 0.9 % IV SOLN
1.0000 g | Freq: Two times a day (BID) | INTRAVENOUS | Status: DC
  Administered 2020-11-26: 1 g via INTRAVENOUS
  Filled 2020-11-25: qty 1

## 2020-11-25 MED ORDER — SODIUM CHLORIDE 0.9% FLUSH
3.0000 mL | Freq: Two times a day (BID) | INTRAVENOUS | Status: DC
  Administered 2020-11-25 – 2020-12-01 (×9): 3 mL via INTRAVENOUS

## 2020-11-25 MED ORDER — SODIUM CHLORIDE 0.9 % IV SOLN
250.0000 mL | INTRAVENOUS | Status: DC | PRN
  Administered 2020-11-29: 250 mL via INTRAVENOUS

## 2020-11-25 MED ORDER — LACTATED RINGERS IV BOLUS
1000.0000 mL | Freq: Once | INTRAVENOUS | Status: AC
  Administered 2020-11-25: 1000 mL via INTRAVENOUS

## 2020-11-25 MED ORDER — INSULIN ASPART 100 UNIT/ML IJ SOLN
0.0000 [IU] | Freq: Three times a day (TID) | INTRAMUSCULAR | Status: DC
  Administered 2020-11-26 (×2): 3 [IU] via SUBCUTANEOUS

## 2020-11-25 MED ORDER — SODIUM CHLORIDE 0.9 % IV SOLN
1.0000 g | Freq: Once | INTRAVENOUS | Status: AC
  Administered 2020-11-25: 1 g via INTRAVENOUS
  Filled 2020-11-25: qty 10

## 2020-11-25 NOTE — ED Notes (Signed)
Attempted IV access x2. 20G placed in R wrist. Pt notably confused and agitated, IV secured with coban and pt placed on bedpan per request. Second blood culture obtained.

## 2020-11-25 NOTE — ED Notes (Signed)
Antibiotics started, unable to administer LR simultaneously due to incompatibility.

## 2020-11-25 NOTE — ED Triage Notes (Signed)
Patient presents to ED via [facility] from [facility] , states she had spinal fusion on 9/12 per ems patient was able to walk yest however wasn't able to sit today. Patient is confused.

## 2020-11-25 NOTE — Progress Notes (Signed)
Notified bedside nurse of need to draw repeat lactic acid. 

## 2020-11-25 NOTE — H&P (Addendum)
History and Physical    Summer Hawkins [id number] DOB: 11/14/1934 DOA: 11/25/2020  PCP: Haywood Pao, MD Consultants:  cardiology: Dr. Angelena Form, oncology: Dr. Lindi Adie, EP: Dr. Lovena Le Patient coming from: SNF  Chief Complaint: weakness/confusion   HPI: Summer Hawkins is a 86 y.o. female with medical history significant of atrial fibrillation on eliquis, chronic diastolic CHF, hypertension, hypothyroidism, bradycardia status post pacemaker placement, history of breast cancer, who underwent a two-level PLIF by Dr. Arnoldo Morale on 11/17/2020. She was discharged to SNF on doxycycline on 11/22/20.   She is not oriented when Im in the room. She tells me she is at her nieces house for easter dinner and is coloring eggs. She has active visual hallucinations pointing to areas and saying they are her grandkids and naming them by names.    Other history provided by her granddaughter. She states she was discharged to the SNF on Monday. She had talked to her son and he thought she was forgetful and confused over  the past few days. The SNF felt she was weaker and confused so sent her to the ED. She is unaware if she had any fevers at the SNF.     ED Course: vitals: temp: 102.1,  blood pressure 135/69, heart rate 73, respiratory rate 35-->26 oxygen 98% on room air. Pertinent labs: WBC 22.6, sodium 134, glucose 275, BUN 33, creatinine 1.69, lactic acid 2.7, UA with moderate leukocytes and many bacteria.  Blood cultures pending.  Chest x-ray with no active disease.  He head with no acute intracranial abnormality.  Bolus 30 mgs/kg of LR in ED and given 1 g of Rocephin.  We were called and asked to admit.  Review of Systems: could not be obtained  Ambulatory Status:  Ambulates with walker    Past Medical History:  Diagnosis Date   Atrial fibrillation ([place])    Breast cancer ([place])    Breast cancer, left ([place])    Chronic kidney disease    stage III - patient was unaware   Diabetes mellitus    Dyspnea     Dysrhythmia    Afib   H/O: hysterectomy    History of colonoscopy 01/25/2010   History of mammogram 08/04/2009   Hyperlipidemia    Hypertension    Hypothyroidism    Ketoacidosis, diabetic, no coma, non-insulin dependent    Type II   Vitamin B12 deficiency     Past Surgical History:  Procedure Laterality Date   ABDOMINAL HYSTERECTOMY     Bilateral foot surgery     BREAST EXCISIONAL BIOPSY Left 12/2015   BREAST LUMPECTOMY Left    2017   BREAST LUMPECTOMY WITH RADIOACTIVE SEED AND SENTINEL LYMPH NODE BIOPSY Left 01/19/2016   Procedure: LEFT BREAST LUMPECTOMY WITH RADIOACTIVE SEED AND SENTINEL LYMPH NODE BIOPSY;  Surgeon: Autumn Messing III, MD;  Location: [place];  Service: General;  Laterality: Left;   BREAST LUMPECTOMY WITH RADIOACTIVE SEED LOCALIZATION Left 01/19/2016   CARDIOVERSION  02/27/2011   Procedure: CARDIOVERSION;  Surgeon: Loralie Champagne, MD;  Location: [place];  Service: Cardiovascular;  Laterality: N/A;   LAPAROSCOPIC CHOLECYSTECTOMY     PACEMAKER IMPLANT N/A 12/02/2018   Procedure: PACEMAKER IMPLANT;  Surgeon: Evans Lance, MD;  Location: [place] [facility];  Service: Cardiovascular;  Laterality: N/A;   ROTATOR CUFF REPAIR Left    TUBAL LIGATION      Social History   Socioeconomic History   Marital status: Widowed    Spouse name: Not on file  Number of children: 2   Years of education: Not on file   Highest education level: Not on file  Occupational History    Employer: OTHER    Comment: Worked at [employer] Use   Smoking status: Never   Smokeless tobacco: Never  Vaping Use   Vaping Use: Never used  Substance and Sexual Activity   Alcohol use: No    Alcohol/week: 0.0 standard drinks   Drug use: No   Sexual activity: Not on file  Other Topics Concern   Not on file  Social History Narrative   Patient since 86   Husband with prostate cancer   10-siblings-no cancer   Social Determinants of Health   Financial Resource Strain: Not on file  Food  Insecurity: Not on file  Transportation Needs: Not on file  Physical Activity: Not on file  Stress: Not on file  Social Connections: Not on file  Intimate Partner Violence: Not on file    Allergies  Allergen Reactions   Bee Venom Shortness Of Breath, Nausea And Vomiting and Other (See Comments)    Makes the patient feel faint, also   Penicillins Anaphylaxis, Hives, Swelling and Other (See Comments)    Has patient had a PCN reaction causing immediate rash, facial/tongue/throat swelling, SOB or lightheadedness with hypotension: Yes Has patient had a PCN reaction causing severe rash involving mucus membranes or skin necrosis: No Has patient had a PCN reaction that required hospitalization: No Has patient had a PCN reaction occurring within the last 10 years: No If all of the above answers are "NO", then may proceed with Cephalosporin use.    Liraglutide     Other reaction(s): Rash at injection site   Lisinopril     Other reaction(s): cough   Zocor [Simvastatin]     Other reaction(s): memory changes    Family History  Problem Relation Age of Onset   Heart failure Father 19       enlarged heart   Pneumonia Mother 76   Diabetes Mother 41   Breast cancer Sister    Cancer Neg Hx     Prior to Admission medications   Medication Sig Start Date End Date Taking? Authorizing Provider  acetaminophen (TYLENOL) 500 MG tablet Take 1,000 mg by mouth every 8 (eight) hours.   Yes [provider]  amLODipine (NORVASC) 5 MG tablet Take 5 mg by mouth daily. 04/29/20  Yes [provider]  anastrozole (ARIMIDEX) 1 MG tablet Take 1 tablet (1 mg total) by mouth daily. 01/06/20  Yes Nicholas Lose, MD  apixaban (ELIQUIS) 5 MG TABS tablet Take 1 tablet (5 mg total) by mouth 2 (two) times daily. 11/16/16  Yes Nicholas Lose, MD  cyclobenzaprine (FLEXERIL) 10 MG tablet Take 1 tablet (10 mg total) by mouth 3 (three) times daily as needed for muscle spasms. 11/22/20  Yes Bergman, Trinda Pascal D, NP   diphenhydramine-acetaminophen (TYLENOL PM) 25-500 MG TABS tablet Take 1 tablet by mouth at bedtime as needed (for sleep).   Yes [provider]  docusate sodium (COLACE) 100 MG capsule Take 1 capsule (100 mg total) by mouth 2 (two) times daily. 11/22/20  Yes Viona Gilmore D, NP  doxycycline (VIBRA-TABS) 100 MG tablet Take 1 tablet (100 mg total) by mouth every 12 (twelve) hours. 11/22/20  Yes Viona Gilmore D, NP  ferrous sulfate 325 (65 FE) MG tablet Take 325 mg by mouth daily with breakfast.   Yes [provider]  irbesartan (AVAPRO) 300 MG tablet Take  300 mg by mouth daily. 02/11/19  Yes [provider]  levothyroxine (SYNTHROID) 137 MCG tablet Take 137 mcg by mouth daily before breakfast. 07/02/18  Yes [provider]  metFORMIN (GLUCOPHAGE) 850 MG tablet Take 850 mg by mouth 2 (two) times daily with a meal.   Yes [provider]  metoprolol succinate (TOPROL XL) 25 MG 24 hr tablet Take 1 tablet (25 mg total) by mouth daily. 05/07/20 05/07/21 Yes Burnell Blanks, MD  Multiple Vitamins-Minerals (MULTIVITAMIN WITH MINERALS) tablet Take 1 tablet by mouth daily.   Yes [provider]  oxyCODONE 10 MG TABS Take 1 tablet (10 mg total) by mouth every 6 (six) hours as needed for severe pain ((score 7 to 10)). 11/22/20  Yes Bergman, Meghan D, NP  polyethylene glycol (MIRALAX / GLYCOLAX) 17 g packet Take 17 g by mouth daily as needed for mild constipation.   Yes [provider]  senna (SENOKOT) 8.6 MG TABS tablet Take 2 tablets by mouth at bedtime.   Yes [provider]  spironolactone (ALDACTONE) 25 MG tablet Take 25 mg by mouth daily.   Yes [provider]  timolol (TIMOPTIC) 0.5 % ophthalmic solution Place 1 drop into both eyes every morning. 05/22/20  Yes [provider]  TOUJEO SOLOSTAR 300 UNIT/ML SOPN Inject 10-15 Units into the skin See admin instructions. Inject 15 units into the skin in the morning before  breakfast and 10 units at bedtime 03/12/15  Yes [provider]  traMADol (ULTRAM) 50 MG tablet Take 50 mg by mouth every 12 (twelve) hours as needed for moderate pain.   Yes [provider]    Physical Exam: Vitals:   11/25/20 1350 11/25/20 1510 11/25/20 1610 11/25/20 1805  BP: (!) 132/100 137/64 130/65 131/84  Pulse: 76 75 74 71  Resp: (!) 32 (!) 26 (!) 23 (!) 21  Temp:      TempSrc:      SpO2: 98% 100% 100% 100%     General:  Appears calm and comfortable and is in NAD. Confused and pulling at her ekg leads and blood pressure cuff.  Eyes:  PERRL, EOMI, normal lids, iris ENT:  grossly normal hearing, lips & tongue, mmm; no teeth on top.  Neck:  no LAD, masses or thyromegaly; no carotid bruits Cardiovascular:  irregularly irregular. No murmur, no rubs/gallops.  No LE edema.  Respiratory:   CTA bilaterally with no wheezes/rales/rhonchi.  Normal respiratory effort. Abdomen:  soft, NT, ND, NABS Back:   normal alignment, no CVAT Skin:  no rash or induration seen on limited exam. Limited exam on back. Incision healing. minimal drainage. Minimal erythema Musculoskeletal:  grossly normal tone BUE/BLE, difficult exam not following directions. , good ROM, no bony abnormality Lower extremity:  No LE edema.  Limited foot exam with no ulcerations.  2+ distal pulses. Psychiatric:  grossly normal mood and affect, speech fluent and appropriate, not oriented to place, date. Knows her birthday.  Neurologic:  CN 2-12 grossly intact, moves all extremities in coordinated fashion, sensation intact. Limited exam due to cooperation/confusion.     Radiological Exams on Admission: Independently reviewed - see discussion in A/P where applicable  CT HEAD WO CONTRAST (5MM)  Result Date: 11/25/2020 CLINICAL DATA:  Altered mental status. EXAM: CT HEAD WITHOUT CONTRAST TECHNIQUE: Contiguous axial images were obtained from the base of the skull through the vertex without intravenous contrast.  COMPARISON:  November 15, 2018. FINDINGS: Brain: Mild chronic ischemic white matter disease is noted. No  mass effect or midline shift is noted. Ventricular size is within normal limits. There is no evidence of mass lesion, hemorrhage or acute infarction. Vascular: No hyperdense vessel or unexpected calcification. Skull: Normal. Negative for fracture or focal lesion. Sinuses/Orbits: No acute finding. Other: None. IMPRESSION: No acute intracranial abnormality seen. Electronically Signed   By: Marijo Conception M.D.   On: 11/25/2020 12:55   DG Chest Port 1 View  Result Date: 11/25/2020 CLINICAL DATA:  Weakness EXAM: PORTABLE CHEST 1 VIEW COMPARISON:  12/03/2018 FINDINGS: Left pacer remains in place, unchanged. Cardiomegaly. No confluent opacities, effusions or edema. No acute bony abnormality. IMPRESSION: No active disease. Electronically Signed   By: Rolm Baptise M.D.   On: 11/25/2020 12:04    EKG: Independently reviewed.  Atrial fib with rate 82; nonspecific ST changes with no evidence of acute ischemia   Labs on Admission: I have personally reviewed the available labs and imaging studies at the time of the admission.  Pertinent labs:   WBC 22.6,  sodium 134,  glucose 275,  BUN 33,  creatinine 1.69,  lactic acid 2.7,  UA with moderate leukocytes and many bacteria.      Assessment/Plan Principal Problem:   Sepsis due to urinary tract infection [facility]) -86 year old female presenting to ER with weakness and confusion found to be septic meeting criteria with elevated lactate, WBC at 22,000 and temp to 102.1.  Urine appears to be infected; however she also recently had back surgery with healing wound. -Sepsis protocol initiated -received '30mg'$ /kg in ED -Continued IV fluids at 100 mL/h however decreasing to 75 in light of diastolic CHF. -Has anaphylaxis to penicillin, pharmacy consult for meropenem for UTI coverage.  Can taper once culture results. -Neurosurgery was consulted to see patient with  recent spinal surgery on 11/17/20. continue doxycycline and follow-up on the recommendation -Urine culture and blood cultures pending  Active Problems:  Acute encephalopathy -Likely secondary to sepsis from UTI.  CT head negative for acute finding.  -Cultures pending,  on appropriate antibiotics  -Back incision looks clean and not infected at this time.  Continue doxycycline.  Neurosurgery following -Polypharmacy could also be playing a role with pain medication and Flexeril. Holding home tramadol and Flexeril and just will continue with oxycodone for her back pain status post recent surgery. -Metabolic labs pending as well  CKD stage 3-4 -around her baseline (creatinine 2 years ago was 1.6-1.8) -Would not restart metformin at discharge -Avoid nephrotoxic drugs -Check I's and O's -Follow renal function    Chronic atrial fibrillation/symptomatic bradycardia s/p PM -Rate controlled.  Placed on telemetry -Continue Toprol and Eliquis    Essential hypertension -Blood pressures are normal side despite not taking medication -We will continue Toprol and Norvasc holding Avapro.  Follow pressures and add back if becomes hypertensive. -Holding spironolactone she takes this for hair growth by her dermatologist     Chronic diastolic CHF (congestive heart failure) ([place]) -No signs of volume overload on exam today -Strict intake and output -Echo in May 2019-normal EF of 55 to 60% with normal systolic function. -Continue medical management follow clinically with IV fluid boluses and continued IV fluid maintenance in setting of sepsis    Type 2 diabetes mellitus without complication, with long-term current use of insulin (HCC) -A1c of 7.0 in January 2020 2 repeat pending -Placing her on sliding scale and decreasing her home long-acting insulin by 50% to 5 units at night as she has poor p.o. intake right now. Did not start back her  AM long acting insulin dose -Accu-Cheks per  protocol  Hypothyroidism -Continue home Synthroid -TSH pending    Breast cancer of upper-outer quadrant of left female breast ([place])  -continue arimidex    There is no height or weight on file to calculate BMI.   Level of care: Telemetry Medical DVT prophylaxis:  eliquis Code Status:  DNR - confirmed with family Family Communication: None present; I spoke with the patient's son Virgel Gess: [phone] and granddaughter Genella Rife: [phone] : Disposition Plan:  The patient is from: SNF  Anticipated d/c is to: SNF  Requires inpatient hospitalization and is at significant risk of worsening, requires constant monitoring, assessment and MDM with specialists.  Patient is currently: acutely ill Consults called: neurosurgery  Admission status:  observation   Dragon dictation used in completing this note.   Orma Flaming MD [facility]   How to contact the [facility] Attending or Consulting provider [place] or covering provider during after hours [place], for this patient?  Check the care team in [facility] and look for a) attending/consulting [facility] provider listed and b) the [facility] team listed Log into [email or web address] and use [place]'s universal password to access. If you do not have the password, please contact the hospital operator. Locate the [facility] provider you are looking for under [facility] and page to a number that you can be directly reached. If you still have difficulty reaching the provider, please page the [facility] (Director on Call) for the Hospitalists listed on amion for assistance.   11/25/2020, 8:11 PM

## 2020-11-25 NOTE — Consult Note (Signed)
Reason for Consult:readmission by medicine.  Referring Physician: Theodis Hawkins is an 86 y.o. female.  HPI: whom was discharged to an extended care facility for rehabilitation on 9/12. [name], a two level lumbar decompression at L3/4, 4/5. She was slow to mobilize hence the suggestion for a Skilled nursing facility. Returns today because the staff felt she was weaker than she had been. [name]. [name].   Past Medical History:  Diagnosis Date   Atrial fibrillation ([place])    Breast cancer ([place])    Breast cancer, left ([place])    Chronic kidney disease    stage III - patient was unaware   Diabetes mellitus    Dyspnea    Dysrhythmia    Afib   H/O: hysterectomy    History of colonoscopy 01/25/2010   History of mammogram 08/04/2009   Hyperlipidemia    Hypertension    Hypothyroidism    Ketoacidosis, diabetic, no coma, non-insulin dependent    Type II   Vitamin B12 deficiency     Past Surgical History:  Procedure Laterality Date   ABDOMINAL HYSTERECTOMY     Bilateral foot surgery     BREAST EXCISIONAL BIOPSY Left 12/2015   BREAST LUMPECTOMY Left    2017   BREAST LUMPECTOMY WITH RADIOACTIVE SEED AND SENTINEL LYMPH NODE BIOPSY Left 01/19/2016   Procedure: LEFT BREAST LUMPECTOMY WITH RADIOACTIVE SEED AND SENTINEL LYMPH NODE BIOPSY;  Surgeon: Autumn Messing III, MD;  Location: [place];  Service: General;  Laterality: Left;   BREAST LUMPECTOMY WITH RADIOACTIVE SEED LOCALIZATION Left 01/19/2016   CARDIOVERSION  02/27/2011   Procedure: CARDIOVERSION;  Surgeon: Loralie Champagne, MD;  Location: [place];  Service: Cardiovascular;  Laterality: N/A;   LAPAROSCOPIC CHOLECYSTECTOMY     PACEMAKER IMPLANT N/A 12/02/2018   Procedure: PACEMAKER IMPLANT;  Surgeon: Evans Lance, MD;  Location: [place] [facility];  Service: Cardiovascular;  Laterality: N/A;   ROTATOR CUFF REPAIR Left    TUBAL LIGATION      Family History  Problem  Relation Age of Onset   Heart failure Father 18       enlarged heart   Pneumonia Mother 24   Diabetes Mother 81   Breast cancer Sister    Cancer Neg Hx     Social History:  reports that she has never smoked. She has never used smokeless tobacco. She reports that she does not drink alcohol and does not use drugs.  Allergies:  Allergies  Allergen Reactions   Bee Venom Shortness Of Breath, Nausea And Vomiting and Other (See Comments)    Makes the patient feel faint, also   Penicillins Anaphylaxis, Hives, Swelling and Other (See Comments)    Has patient had a PCN reaction causing immediate rash, facial/tongue/throat swelling, SOB or lightheadedness with hypotension: Yes Has patient had a PCN reaction causing severe rash involving mucus membranes or skin necrosis: No Has patient had a PCN reaction that required hospitalization: No Has patient had a PCN reaction occurring within the last 10 years: No If all of the above answers are "NO", then may proceed with Cephalosporin use.    Liraglutide     Other reaction(s): Rash at injection site   Lisinopril     Other reaction(s): cough   Zocor [Simvastatin]     Other reaction(s): memory changes    Medications: I have reviewed the patient's current medications.  Results for orders placed or performed during the hospital  encounter of 11/25/20 (from the past 48 hour(s))  CBC     Status: Abnormal   Collection Time: 11/25/20 11:54 AM  Result Value Ref Range   WBC 22.6 (H) 4.0 - 10.5 K/uL   RBC 3.57 (L) 3.87 - 5.11 MIL/uL   Hemoglobin 11.5 (L) 12.0 - 15.0 g/dL   HCT 36.1 36.0 - 46.0 %   MCV 101.1 (H) 80.0 - 100.0 fL   MCH 32.2 26.0 - 34.0 pg   MCHC 31.9 30.0 - 36.0 g/dL   RDW 14.1 11.5 - 15.5 %   Platelets 364 150 - 400 K/uL   nRBC 0.0 0.0 - 0.2 %    Comment: Performed at [facility], [place] [address], [place], [place] [address]  Comprehensive metabolic panel     Status: Abnormal   Collection Time: 11/25/20 11:54 AM  Result Value  Ref Range   Sodium 134 (L) 135 - 145 mmol/L   Potassium 5.1 3.5 - 5.1 mmol/L   Chloride 102 98 - 111 mmol/L   CO2 20 (L) 22 - 32 mmol/L   Glucose, Bld 275 (H) 70 - 99 mg/dL    Comment: Glucose reference range applies only to samples taken after fasting for at least 8 hours.   BUN 33 (H) 8 - 23 mg/dL   Creatinine, Ser 1.69 (H) 0.44 - 1.00 mg/dL   Calcium 8.9 8.9 - 10.3 mg/dL   Total Protein 6.6 6.5 - 8.1 g/dL   Albumin 2.9 (L) 3.5 - 5.0 g/dL   AST 29 15 - 41 U/L   ALT 52 (H) 0 - 44 U/L   Alkaline Phosphatase 94 38 - 126 U/L   Total Bilirubin 1.1 0.3 - 1.2 mg/dL   GFR, Estimated 30 (L) >60 mL/min    Comment: (NOTE) Calculated using the CKD-EPI Creatinine Equation (2021)    Anion gap 12 5 - 15    Comment: Performed at [facility], [place] [address], [place], [place] [address]  Lactic acid, plasma     Status: Abnormal   Collection Time: 11/25/20 11:56 AM  Result Value Ref Range   Lactic Acid, Venous 2.7 (HH) 0.5 - 1.9 mmol/L    Comment: CRITICAL RESULT CALLED TO, READ BACK BY AND VERIFIED WITH: Elyse Jarvis RN BY SSTEPHENS [id number] Performed at [facility], [place] [address] [place], [place] [address]   Urinalysis, Routine w reflex microscopic Urine, Catheterized     Status: Abnormal   Collection Time: 11/25/20  1:04 PM  Result Value Ref Range   Color, Urine YELLOW YELLOW   APPearance HAZY (A) CLEAR   Specific Gravity, Urine 1.016 1.005 - 1.030   pH 5.0 5.0 - 8.0   Glucose, UA NEGATIVE NEGATIVE mg/dL   Hgb urine dipstick NEGATIVE NEGATIVE   Bilirubin Urine NEGATIVE NEGATIVE   Ketones, ur NEGATIVE NEGATIVE mg/dL   Protein, ur 30 (A) NEGATIVE mg/dL   Nitrite NEGATIVE NEGATIVE   Leukocytes,Ua MODERATE (A) NEGATIVE   RBC / HPF 0-5 0 - 5 RBC/hpf   WBC, UA 11-20 0 - 5 WBC/hpf   Bacteria, UA MANY (A) NONE SEEN   Squamous Epithelial / LPF 0-5 0 - 5    Comment: Performed at [facility], [place] [address], [place], [place] [address]    CT HEAD WO CONTRAST  (5MM)  Result Date: 11/25/2020 CLINICAL DATA:  Altered mental status. EXAM: CT HEAD WITHOUT CONTRAST TECHNIQUE: Contiguous axial images were obtained from the base of the skull  through the vertex without intravenous contrast. COMPARISON:  November 15, 2018. FINDINGS: Brain: Mild chronic ischemic white matter disease is noted. No mass effect or midline shift is noted. Ventricular size is within normal limits. There is no evidence of mass lesion, hemorrhage or acute infarction. Vascular: No hyperdense vessel or unexpected calcification. Skull: Normal. Negative for fracture or focal lesion. Sinuses/Orbits: No acute finding. Other: None. IMPRESSION: No acute intracranial abnormality seen. Electronically Signed   By: Marijo Conception M.D.   On: 11/25/2020 12:55   DG Chest Port 1 View  Result Date: 11/25/2020 CLINICAL DATA:  Weakness EXAM: PORTABLE CHEST 1 VIEW COMPARISON:  12/03/2018 FINDINGS: Left pacer remains in place, unchanged. Cardiomegaly. No confluent opacities, effusions or edema. No acute bony abnormality. IMPRESSION: No active disease. Electronically Signed   By: Rolm Baptise M.D.   On: 11/25/2020 12:04    Review of Systems  Constitutional:  Positive for fatigue and fever.  HENT: Negative.    Eyes: Negative.   Respiratory: Negative.    Cardiovascular: Negative.   Blood pressure 130/65, pulse 74, temperature (!) 102.1 F (38.9 C), temperature source Rectal, resp. rate (!) 23, SpO2 100 %. Physical Exam Constitutional:      Appearance: She is obese. She is ill-appearing.  HENT:     Head: Normocephalic and atraumatic.     Right Ear: External ear normal.     Left Ear: External ear normal.     Nose: Nose normal.     Mouth/Throat:     Mouth: Mucous membranes are moist.     Pharynx: Oropharynx is clear.  Eyes:     Extraocular Movements: Extraocular movements intact.     Conjunctiva/sclera: Conjunctivae normal.     Pupils: Pupils are equal, round, and reactive to light.  Cardiovascular:      Rate and Rhythm: Normal rate.  Pulmonary:     Effort: Pulmonary effort is normal.  Abdominal:     General: Abdomen is flat.     Palpations: Abdomen is soft.  Musculoskeletal:        General: Normal range of motion.     Cervical back: Normal range of motion.  Neurological:     Mental Status: She is alert. She is confused.     Cranial Nerves: Cranial nerves are intact.     Motor: Motor function is intact.     Coordination: Coordination is intact.     Comments: Confused, not consistent with place, time, situation Speech is clear Right lower extremity is slightly weaker Following commands    Wound is slightly erythematous, minimal drainage but there is some  Assessment/Plan: Summer Hawkins is a 86 y.o. female being admitted for delirium  , an uti, and sepsis.  Neurological exam motor wise is fine. Pain in the back is expected and consistent with this procedure. Will monitor wound carefully Will follow Ashok Pall 11/25/2020, 5:52 PM

## 2020-11-25 NOTE — ED Provider Notes (Signed)
[facility] EMERGENCY DEPARTMENT Provider Note   CSN: [id number] Arrival date & time: 11/25/20  1101     History Chief Complaint  Patient presents with   Altered Mental Status    Summer Hawkins is a 86 y.o. female.  Patient s/p recent lumbar fusion surgery, and d/c 9/12, noted at ecf to appear generally weak today. Symptoms acute onset, moderate, persistent. Patient indicates she feels okay - states pain at incision/surgical site since procedure, but denies acute or abrupt worsening. Denies numbness or weakness. Denies problems controlling normal bowel and bladder function. Denies dysuria or urgency. No abd pain or nvd. Occasional non prod cough. No sore throat. Denies specific known ill contacts or known covid exposure. Pt denies change in meds. No trauma or fall. No fever or chills.   The history is provided by the patient, the nursing home, medical records and the EMS personnel.  Altered Mental Status Presenting symptoms: no confusion   Associated symptoms: no abdominal pain, no fever, no headaches, no rash and no vomiting       Past Medical History:  Diagnosis Date   Atrial fibrillation ([place])    Breast cancer ([place])    Breast cancer, left ([place])    Chronic kidney disease    stage III - patient was unaware   Diabetes mellitus    Dyspnea    Dysrhythmia    Afib   H/O: hysterectomy    History of colonoscopy 01/25/2010   History of mammogram 08/04/2009   Hyperlipidemia    Hypertension    Hypothyroidism    Ketoacidosis, diabetic, no coma, non-insulin dependent    Type II   Vitamin B12 deficiency     Patient Active Problem List   Diagnosis Date Noted   Spondylolisthesis, lumbar region 11/17/2020   Posterior capsular opacification, left 03/31/2020   Posterior vitreous detachment of both eyes 03/31/2020   Intermediate stage nonexudative age-related macular degeneration of both eyes 09/25/2019   Moderate nonproliferative diabetic retinopathy of both eyes  ([place]) 09/25/2019   Pacemaker 03/20/2019   Symptomatic bradycardia 11/30/2018   Syncope and collapse 11/21/2018   Bradycardia 09/25/2017   Permanent atrial fibrillation ([facility])    Chronic diastolic CHF (congestive heart failure) ([place]) 08/08/2017   Essential hypertension 08/08/2017   Breast cancer of upper-outer quadrant of left female breast ([place]) 01/05/2016   Chronic atrial fibrillation ([place]) 01/05/2011    Past Surgical History:  Procedure Laterality Date   ABDOMINAL HYSTERECTOMY     Bilateral foot surgery     BREAST EXCISIONAL BIOPSY Left 12/2015   BREAST LUMPECTOMY Left    2017   BREAST LUMPECTOMY WITH RADIOACTIVE SEED AND SENTINEL LYMPH NODE BIOPSY Left 01/19/2016   Procedure: LEFT BREAST LUMPECTOMY WITH RADIOACTIVE SEED AND SENTINEL LYMPH NODE BIOPSY;  Surgeon: Autumn Messing III, MD;  Location: [facility];  Service: General;  Laterality: Left;   BREAST LUMPECTOMY WITH RADIOACTIVE SEED LOCALIZATION Left 01/19/2016   CARDIOVERSION  02/27/2011   Procedure: CARDIOVERSION;  Surgeon: Loralie Champagne, MD;  Location: [facility];  Service: Cardiovascular;  Laterality: N/A;   LAPAROSCOPIC CHOLECYSTECTOMY     PACEMAKER IMPLANT N/A 12/02/2018   Procedure: PACEMAKER IMPLANT;  Surgeon: Evans Lance, MD;  Location: [facility];  Service: Cardiovascular;  Laterality: N/A;   ROTATOR CUFF REPAIR Left    TUBAL LIGATION       OB History   No obstetric history on file.     Family History  Problem Relation Age of Onset  Heart failure Father 59       enlarged heart   Pneumonia Mother 71   Diabetes Mother 59   Breast cancer Sister    Cancer Neg Hx     Social History   Tobacco Use   Smoking status: Never   Smokeless tobacco: Never  Vaping Use   Vaping Use: Never used  Substance Use Topics   Alcohol use: No    Alcohol/week: 0.0 standard drinks   Drug use: No    Home Medications Prior to Admission medications   Medication Sig Start Date End Date Taking? Authorizing Provider  amLODipine  (NORVASC) 5 MG tablet Take 5 mg by mouth daily. 04/29/20   [provider]  anastrozole (ARIMIDEX) 1 MG tablet Take 1 tablet (1 mg total) by mouth daily. 01/06/20   Nicholas Lose, MD  apixaban (ELIQUIS) 5 MG TABS tablet Take 1 tablet (5 mg total) by mouth 2 (two) times daily. 11/16/16   Nicholas Lose, MD  cyclobenzaprine (FLEXERIL) 10 MG tablet Take 1 tablet (10 mg total) by mouth 3 (three) times daily as needed for muscle spasms. 11/22/20   Viona Gilmore D, NP  diphenhydramine-acetaminophen (TYLENOL PM) 25-500 MG TABS tablet Take 1 tablet by mouth at bedtime as needed (for sleep).    [provider]  docusate sodium (COLACE) 100 MG capsule Take 1 capsule (100 mg total) by mouth 2 (two) times daily. 11/22/20   Viona Gilmore D, NP  doxycycline (VIBRA-TABS) 100 MG tablet Take 1 tablet (100 mg total) by mouth every 12 (twelve) hours. 11/22/20   Viona Gilmore D, NP  ferrous sulfate 325 (65 FE) MG tablet Take 325 mg by mouth daily with breakfast.    [provider]  irbesartan (AVAPRO) 300 MG tablet Take 300 mg by mouth daily. 02/11/19   [provider]  levothyroxine (SYNTHROID) 137 MCG tablet Take 137 mcg by mouth daily before breakfast. 07/02/18   [provider]  metFORMIN (GLUCOPHAGE) 850 MG tablet Take 850 mg by mouth 2 (two) times daily with a meal.    [provider]  metoprolol succinate (TOPROL XL) 25 MG 24 hr tablet Take 1 tablet (25 mg total) by mouth daily. 05/07/20 05/07/21  Burnell Blanks, MD  Multiple Vitamins-Minerals (MULTIVITAMIN WITH MINERALS) tablet Take 1 tablet by mouth daily.    [provider]  oxyCODONE 10 MG TABS Take 1 tablet (10 mg total) by mouth every 6 (six) hours as needed for severe pain ((score 7 to 10)). 11/22/20   Viona Gilmore D, NP  spironolactone (ALDACTONE) 25 MG tablet Take 25 mg by mouth daily. Prescribed by Derm for hair growth per pt.    [provider]  timolol (TIMOPTIC) 0.5 %  ophthalmic solution 1 drop every morning. 05/22/20   [provider]  TOUJEO SOLOSTAR 300 UNIT/ML SOPN Inject 10-15 Units into the skin See admin instructions. Inject 15 units into the skin in the morning before breakfast and 10 units at bedtime 03/12/15   [provider]    Allergies    Bee venom, Penicillins, Liraglutide, Lisinopril, and Zocor [simvastatin]  Review of Systems   Review of Systems  Constitutional:  Negative for chills and fever.  HENT:  Negative for sore throat.   Eyes:  Negative for redness.  Respiratory:  Positive for cough. Negative for shortness of breath.   Cardiovascular:  Negative for chest pain.  Gastrointestinal:  Negative for abdominal pain, diarrhea and vomiting.  Genitourinary:  Negative for dysuria and flank  pain.  Musculoskeletal:  Negative for neck pain and neck stiffness.  Skin:  Negative for rash.  Neurological:  Negative for numbness and headaches.  Hematological:        Denies abnormal bruising/bleeding. Is on anticoag therapy.   Psychiatric/Behavioral:  Negative for confusion.    Physical Exam Updated Vital Signs LMP  (LMP Unknown)   Physical Exam Vitals and nursing note reviewed.  Constitutional:      Appearance: Normal appearance. She is well-developed.  HENT:     Head: Atraumatic.     Nose: Nose normal.     Mouth/Throat:     Mouth: Mucous membranes are moist.  Eyes:     General: No scleral icterus.    Conjunctiva/sclera: Conjunctivae normal.     Pupils: Pupils are equal, round, and reactive to light.  Neck:     Trachea: No tracheal deviation.     Comments: No stiffness or rigidity.  Cardiovascular:     Rate and Rhythm: Normal rate and regular rhythm.     Pulses: Normal pulses.     Heart sounds: Normal heart sounds. No murmur heard.   No friction rub. No gallop.  Pulmonary:     Effort: Pulmonary effort is normal. No respiratory distress.     Breath sounds: Normal breath sounds.  Abdominal:     General: Bowel  sounds are normal. There is no distension.     Palpations: Abdomen is soft.     Tenderness: There is no abdominal tenderness.  Genitourinary:    Comments: No cva tenderness.  Musculoskeletal:        General: No swelling or tenderness.     Cervical back: Normal range of motion and neck supple. No rigidity. No muscular tenderness.     Comments: Healing surgical scar lumbar area. Dressing is moist with small amount serosanguinous discharge. No active discharge from wound, no purulent discharge or malodor. No cellulitis. No crepitus.   Skin:    General: Skin is warm and dry.     Findings: No rash.  Neurological:     Mental Status: She is alert.     Comments: Alert, speech normal. No dysarthria or aphasia. Motor intact bil, stre 5/5. Sens grossly intact.   Psychiatric:        Mood and Affect: Mood normal.    ED Results / Procedures / Treatments   Labs (all labs ordered are listed, but only abnormal results are displayed) Results for orders placed or performed during the hospital encounter of 11/25/20  CBC  Result Value Ref Range   WBC 22.6 (H) 4.0 - 10.5 K/uL   RBC 3.57 (L) 3.87 - 5.11 MIL/uL   Hemoglobin 11.5 (L) 12.0 - 15.0 g/dL   HCT 36.1 36.0 - 46.0 %   MCV 101.1 (H) 80.0 - 100.0 fL   MCH 32.2 26.0 - 34.0 pg   MCHC 31.9 30.0 - 36.0 g/dL   RDW 14.1 11.5 - 15.5 %   Platelets 364 150 - 400 K/uL   nRBC 0.0 0.0 - 0.2 %  Comprehensive metabolic panel  Result Value Ref Range   Sodium 134 (L) 135 - 145 mmol/L   Potassium 5.1 3.5 - 5.1 mmol/L   Chloride 102 98 - 111 mmol/L   CO2 20 (L) 22 - 32 mmol/L   Glucose, Bld 275 (H) 70 - 99 mg/dL   BUN 33 (H) 8 - 23 mg/dL   Creatinine, Ser 1.69 (H) 0.44 - 1.00 mg/dL   Calcium 8.9 8.9 - 10.3 mg/dL  Total Protein 6.6 6.5 - 8.1 g/dL   Albumin 2.9 (L) 3.5 - 5.0 g/dL   AST 29 15 - 41 U/L   ALT 52 (H) 0 - 44 U/L   Alkaline Phosphatase 94 38 - 126 U/L   Total Bilirubin 1.1 0.3 - 1.2 mg/dL   GFR, Estimated 30 (L) >60 mL/min   Anion gap 12 5  - 15  Urinalysis, Routine w reflex microscopic Urine, Catheterized  Result Value Ref Range   Color, Urine YELLOW YELLOW   APPearance HAZY (A) CLEAR   Specific Gravity, Urine 1.016 1.005 - 1.030   pH 5.0 5.0 - 8.0   Glucose, UA NEGATIVE NEGATIVE mg/dL   Hgb urine dipstick NEGATIVE NEGATIVE   Bilirubin Urine NEGATIVE NEGATIVE   Ketones, ur NEGATIVE NEGATIVE mg/dL   Protein, ur 30 (A) NEGATIVE mg/dL   Nitrite NEGATIVE NEGATIVE   Leukocytes,Ua MODERATE (A) NEGATIVE   RBC / HPF 0-5 0 - 5 RBC/hpf   WBC, UA 11-20 0 - 5 WBC/hpf   Bacteria, UA MANY (A) NONE SEEN   Squamous Epithelial / LPF 0-5 0 - 5  Lactic acid, plasma  Result Value Ref Range   Lactic Acid, Venous 2.7 (HH) 0.5 - 1.9 mmol/L   DG Lumbar Spine 2-3 Views  Result Date: 11/17/2020 CLINICAL DATA:  Posterior lumbar interbody fusion. EXAM: LUMBAR SPINE - 2-3 VIEW COMPARISON:  Radiograph 10/28/2020 confirming 5 lumbar type vertebra. FINDINGS: Two fluoroscopic spot views obtained in the operating room. Intrapedicular screws at L3 through L5 with interbody spacers in place. Fluoroscopy time 17 seconds. Dose 14.06 mGy. IMPRESSION: Fluoroscopic spot views during L3-L5 fusion. Electronically Signed   By: Keith Rake M.D.   On: 11/17/2020 19:15   DG Lumbar Spine Complete  Result Date: 10/28/2020 CLINICAL DATA:  Fall EXAM: LUMBAR SPINE - COMPLETE 4+ VIEW COMPARISON:  Lumbar spine MRI 10/05/2020 FINDINGS: There are 5 non-rib-bearing lumbar type vertebral bodies. Vertebral body heights are preserved. Alignment is normal. There is no evidence of spondylolysis. Facet arthropathy is most advanced at L4-L5 and L5-S1. There is calcified atherosclerotic plaque of the aortic arch. Cholecystectomy clips are noted. Cardiac device leads are partially imaged. IMPRESSION: No acute findings. Electronically Signed   By: Valetta Mole M.D.   On: 10/28/2020 15:30   DG Lumbar Spine 1 View  Result Date: 11/17/2020 CLINICAL DATA:  Elective surgery, obtained in  the operating room. EXAM: LUMBAR SPINE - 1 VIEW COMPARISON:  10/28/2020 confirming 5 lumbar type vertebra. FINDINGS: Cross-table lateral view of the lumbar spine obtained in the operating room. Surgical instruments localize posterior to the L4-L5 disc space projecting over the posterior elements. IMPRESSION: Surgical instruments posterior to the L4-L5 disc space. Electronically Signed   By: Keith Rake M.D.   On: 11/17/2020 19:14   DG C-Arm 1-60 Min-No Report  Result Date: 11/17/2020 Fluoroscopy was utilized by the requesting physician.  No radiographic interpretation.   DG Hip Unilat W or Wo Pelvis 2-3 Views Left  Result Date: 10/28/2020 CLINICAL DATA:  Fall EXAM: DG HIP (WITH OR WITHOUT PELVIS) 2-3V LEFT COMPARISON:  Hip radiographs 08/31/2020, hip MRI 10/21/2020 FINDINGS: There is no acute fracture or dislocation. Femoroacetabular alignment is normal bilaterally. There are severe degenerative changes in the left hip, stable. There are more mild degenerative changes of the right hip. The SI joints and symphysis pubis are intact. The soft tissues are unremarkable. IMPRESSION: 1. No acute fracture or dislocation. 2. Severe degenerative changes of the left hip, stable Electronically Signed  By: Valetta Mole M.D.   On: 10/28/2020 15:28    EKG EKG Interpretation  Date/Time:  Thursday November 25 2020 11:46:17 EDT Ventricular Rate:  82 PR Interval:    QRS Duration: 105 QT Interval:  377 QTC Calculation: 441 R Axis:   -10 Text Interpretation: Electronic ventricular pacemaker Nonspecific ST abnormality Confirmed by Lajean Saver [id number]) on 11/25/2020 12:08:00 PM  Radiology CT HEAD WO CONTRAST (5MM)  Result Date: 11/25/2020 CLINICAL DATA:  Altered mental status. EXAM: CT HEAD WITHOUT CONTRAST TECHNIQUE: Contiguous axial images were obtained from the base of the skull through the vertex without intravenous contrast. COMPARISON:  November 15, 2018. FINDINGS: Brain: Mild chronic ischemic white  matter disease is noted. No mass effect or midline shift is noted. Ventricular size is within normal limits. There is no evidence of mass lesion, hemorrhage or acute infarction. Vascular: No hyperdense vessel or unexpected calcification. Skull: Normal. Negative for fracture or focal lesion. Sinuses/Orbits: No acute finding. Other: None. IMPRESSION: No acute intracranial abnormality seen. Electronically Signed   By: Marijo Conception M.D.   On: 11/25/2020 12:55   DG Chest Port 1 View  Result Date: 11/25/2020 CLINICAL DATA:  Weakness EXAM: PORTABLE CHEST 1 VIEW COMPARISON:  12/03/2018 FINDINGS: Left pacer remains in place, unchanged. Cardiomegaly. No confluent opacities, effusions or edema. No acute bony abnormality. IMPRESSION: No active disease. Electronically Signed   By: Rolm Baptise M.D.   On: 11/25/2020 12:04    Procedures Procedures   Medications Ordered in ED Medications - No data to display  ED Course  I have reviewed the triage vital signs and the nursing notes.  Pertinent labs & imaging results that were available during my care of the patient were reviewed by me and considered in my medical decision making (see chart for details).    MDM Rules/Calculators/A&P                          Iv ns. Continuous pulse ox and cardiac monitoring. Stat labs. Imaging.   Reviewed nursing notes and prior charts for additional history.   Labs reviewed/interpreted by me - wbc high, 22. Ua with 11-20 wbc and many bacteria - will culture and tx.   Rocephin iv.   Additional labs reviewed/interpreted by me - lactate high. 30 cc/kg ns bolus. Repeat pending.   CXR reviewed/interpreted by me - no pna.   CT reviewed/interpreted by me -  no hem.   Hospitalists consulted for admission.  CRITICAL CARE RE: UTI with sepsis, elevated lactate.  Performed by: Mirna Mires Total critical care time: 45 minutes Critical care time was exclusive of separately billable procedures and treating other  patients. Critical care was necessary to treat or prevent imminent or life-threatening deterioration. Critical care was time spent personally by me on the following activities: development of treatment plan with patient and/or surrogate as well as nursing, discussions with consultants, evaluation of patient's response to treatment, examination of patient, obtaining history from patient or surrogate, ordering and performing treatments and interventions, ordering and review of laboratory studies, ordering and review of radiographic studies, pulse oximetry and re-evaluation of patient's condition.    Final Clinical Impression(s) / ED Diagnoses Final diagnoses:  None    Rx / DC Orders ED Discharge Orders     None        Lajean Saver, MD 11/25/20 1515

## 2020-11-25 NOTE — ED Notes (Signed)
Pt cleaned up and purewick placed

## 2020-11-25 NOTE — Telephone Encounter (Signed)
[id number]/tct-cell phone no answer tct [facility] patient is still in house.

## 2020-11-25 NOTE — Progress Notes (Signed)
Elink following for code sepsis, requested repeat lactate ([place] to RN )

## 2020-11-25 NOTE — Progress Notes (Signed)
Pharmacy Antibiotic Note  Summer Hawkins is a 86 y.o. female admitted on 11/25/2020 with sepsis 2/2 UTI. Presented to ED with weakness s/p lumbar fusion 9/7. Patient denied dysuria, urgency, abdominal pain or fever/chills. Pharmacy has been consulted for meropenem dosing.   Patient has history of anaphylaxis with penicillin use.  WBC 22.6, Tmax 102.54F SCr 1.69 - unclear what baseline is  Plan: Give meropenem 1 gm IV x1 Start meropenem 1 gm every 12 hours Monitor renal function, CBC, cultures/sensitivities, LOT and de-escalate as able  Temp (24hrs), Avg:100.5 F (38.1 C), Min:98.8 F (37.1 C), Max:102.1 F (38.9 C)  Recent Labs  Lab 11/25/20 1154 11/25/20 1156  WBC 22.6*  --   CREATININE 1.69*  --   LATICACIDVEN  --  2.7*    Estimated Creatinine Clearance: 26.2 mL/min (A) (by C-G formula based on SCr of 1.69 mg/dL (H)).    Allergies  Allergen Reactions   Bee Venom Shortness Of Breath, Nausea And Vomiting and Other (See Comments)    Makes the patient feel faint, also   Penicillins Anaphylaxis, Hives, Swelling and Other (See Comments)    Has patient had a PCN reaction causing immediate rash, facial/tongue/throat swelling, SOB or lightheadedness with hypotension: Yes Has patient had a PCN reaction causing severe rash involving mucus membranes or skin necrosis: No Has patient had a PCN reaction that required hospitalization: No Has patient had a PCN reaction occurring within the last 10 years: No If all of the above answers are "NO", then may proceed with Cephalosporin use.    Liraglutide     Other reaction(s): Rash at injection site   Lisinopril     Other reaction(s): cough   Zocor [Simvastatin]     Other reaction(s): memory changes    Antimicrobials this admission: CTX 9/15 x1 Meropenem 9/15 >>  Dose adjustments this admission: Meropenem 1 gm every 12 hours for CrCl 26 mL/min  Microbiology results: 9/15 BCx: pending 9/15 UCx: pending  Thank you for allowing  pharmacy to be a part of this patient's care.  Laurey Arrow, PharmD PGY1 Pharmacy Resident 11/25/2020  5:14 PM  Please check [email or web address] for unit-specific pharmacy phone numbers.

## 2020-11-25 NOTE — ED Notes (Signed)
Patient transported to CT 

## 2020-11-25 NOTE — ED Notes (Signed)
This NT went in to check on pt. Pt pulled off all her cords and gown. Placed pt on new gown and reconnected her to the monitor

## 2020-11-26 DIAGNOSIS — Z794 Long term (current) use of insulin: Secondary | ICD-10-CM | POA: Diagnosis not present

## 2020-11-26 DIAGNOSIS — R2681 Unsteadiness on feet: Secondary | ICD-10-CM | POA: Diagnosis not present

## 2020-11-26 DIAGNOSIS — R7881 Bacteremia: Secondary | ICD-10-CM

## 2020-11-26 DIAGNOSIS — M549 Dorsalgia, unspecified: Secondary | ICD-10-CM

## 2020-11-26 DIAGNOSIS — G9341 Metabolic encephalopathy: Secondary | ICD-10-CM

## 2020-11-26 DIAGNOSIS — E1122 Type 2 diabetes mellitus with diabetic chronic kidney disease: Secondary | ICD-10-CM | POA: Diagnosis present

## 2020-11-26 DIAGNOSIS — I130 Hypertensive heart and chronic kidney disease with heart failure and stage 1 through stage 4 chronic kidney disease, or unspecified chronic kidney disease: Secondary | ICD-10-CM | POA: Diagnosis present

## 2020-11-26 DIAGNOSIS — Z66 Do not resuscitate: Secondary | ICD-10-CM | POA: Diagnosis present

## 2020-11-26 DIAGNOSIS — C50412 Malignant neoplasm of upper-outer quadrant of left female breast: Secondary | ICD-10-CM | POA: Diagnosis present

## 2020-11-26 DIAGNOSIS — L89152 Pressure ulcer of sacral region, stage 2: Secondary | ICD-10-CM | POA: Diagnosis present

## 2020-11-26 DIAGNOSIS — A419 Sepsis, unspecified organism: Secondary | ICD-10-CM | POA: Diagnosis present

## 2020-11-26 DIAGNOSIS — E1139 Type 2 diabetes mellitus with other diabetic ophthalmic complication: Secondary | ICD-10-CM | POA: Diagnosis present

## 2020-11-26 DIAGNOSIS — Z20822 Contact with and (suspected) exposure to covid-19: Secondary | ICD-10-CM | POA: Diagnosis present

## 2020-11-26 DIAGNOSIS — Z6836 Body mass index (BMI) 36.0-36.9, adult: Secondary | ICD-10-CM | POA: Diagnosis not present

## 2020-11-26 DIAGNOSIS — Z170 Estrogen receptor positive status [ER+]: Secondary | ICD-10-CM | POA: Diagnosis not present

## 2020-11-26 DIAGNOSIS — Z950 Presence of cardiac pacemaker: Secondary | ICD-10-CM | POA: Diagnosis not present

## 2020-11-26 DIAGNOSIS — R5381 Other malaise: Secondary | ICD-10-CM | POA: Diagnosis not present

## 2020-11-26 DIAGNOSIS — G8929 Other chronic pain: Secondary | ICD-10-CM | POA: Diagnosis not present

## 2020-11-26 DIAGNOSIS — E119 Type 2 diabetes mellitus without complications: Secondary | ICD-10-CM | POA: Diagnosis not present

## 2020-11-26 DIAGNOSIS — E11319 Type 2 diabetes mellitus with unspecified diabetic retinopathy without macular edema: Secondary | ICD-10-CM | POA: Diagnosis not present

## 2020-11-26 DIAGNOSIS — A4159 Other Gram-negative sepsis: Secondary | ICD-10-CM | POA: Diagnosis present

## 2020-11-26 DIAGNOSIS — R5383 Other fatigue: Secondary | ICD-10-CM | POA: Diagnosis not present

## 2020-11-26 DIAGNOSIS — N183 Chronic kidney disease, stage 3 unspecified: Secondary | ICD-10-CM

## 2020-11-26 DIAGNOSIS — N390 Urinary tract infection, site not specified: Secondary | ICD-10-CM

## 2020-11-26 DIAGNOSIS — R6520 Severe sepsis without septic shock: Secondary | ICD-10-CM | POA: Diagnosis present

## 2020-11-26 DIAGNOSIS — I480 Paroxysmal atrial fibrillation: Secondary | ICD-10-CM | POA: Diagnosis not present

## 2020-11-26 DIAGNOSIS — Z7401 Bed confinement status: Secondary | ICD-10-CM | POA: Diagnosis not present

## 2020-11-26 DIAGNOSIS — R4182 Altered mental status, unspecified: Secondary | ICD-10-CM | POA: Diagnosis not present

## 2020-11-26 DIAGNOSIS — I129 Hypertensive chronic kidney disease with stage 1 through stage 4 chronic kidney disease, or unspecified chronic kidney disease: Secondary | ICD-10-CM | POA: Diagnosis not present

## 2020-11-26 DIAGNOSIS — I5032 Chronic diastolic (congestive) heart failure: Secondary | ICD-10-CM | POA: Diagnosis present

## 2020-11-26 DIAGNOSIS — I69828 Other speech and language deficits following other cerebrovascular disease: Secondary | ICD-10-CM | POA: Diagnosis not present

## 2020-11-26 DIAGNOSIS — E039 Hypothyroidism, unspecified: Secondary | ICD-10-CM | POA: Diagnosis present

## 2020-11-26 DIAGNOSIS — E872 Acidosis: Secondary | ICD-10-CM | POA: Diagnosis present

## 2020-11-26 DIAGNOSIS — I495 Sick sinus syndrome: Secondary | ICD-10-CM | POA: Diagnosis present

## 2020-11-26 DIAGNOSIS — I482 Chronic atrial fibrillation, unspecified: Secondary | ICD-10-CM | POA: Diagnosis present

## 2020-11-26 DIAGNOSIS — Z79890 Hormone replacement therapy (postmenopausal): Secondary | ICD-10-CM | POA: Diagnosis not present

## 2020-11-26 DIAGNOSIS — Z9889 Other specified postprocedural states: Secondary | ICD-10-CM | POA: Diagnosis not present

## 2020-11-26 DIAGNOSIS — D631 Anemia in chronic kidney disease: Secondary | ICD-10-CM | POA: Diagnosis present

## 2020-11-26 DIAGNOSIS — E669 Obesity, unspecified: Secondary | ICD-10-CM | POA: Diagnosis present

## 2020-11-26 DIAGNOSIS — N1832 Chronic kidney disease, stage 3b: Secondary | ICD-10-CM | POA: Diagnosis present

## 2020-11-26 DIAGNOSIS — Z23 Encounter for immunization: Secondary | ICD-10-CM | POA: Diagnosis not present

## 2020-11-26 DIAGNOSIS — Z79811 Long term (current) use of aromatase inhibitors: Secondary | ICD-10-CM | POA: Diagnosis not present

## 2020-11-26 DIAGNOSIS — R41841 Cognitive communication deficit: Secondary | ICD-10-CM | POA: Diagnosis not present

## 2020-11-26 DIAGNOSIS — M6281 Muscle weakness (generalized): Secondary | ICD-10-CM | POA: Diagnosis not present

## 2020-11-26 LAB — CBC WITH DIFFERENTIAL/PLATELET
Abs Immature Granulocytes: 0.16 10*3/uL — ABNORMAL HIGH (ref 0.00–0.07)
Basophils Absolute: 0.1 10*3/uL (ref 0.0–0.1)
Basophils Relative: 0 %
Eosinophils Absolute: 0 10*3/uL (ref 0.0–0.5)
Eosinophils Relative: 0 %
HCT: 32.9 % — ABNORMAL LOW (ref 36.0–46.0)
Hemoglobin: 10.2 g/dL — ABNORMAL LOW (ref 12.0–15.0)
Immature Granulocytes: 1 %
Lymphocytes Relative: 9 %
Lymphs Abs: 1.7 10*3/uL (ref 0.7–4.0)
MCH: 31.8 pg (ref 26.0–34.0)
MCHC: 31 g/dL (ref 30.0–36.0)
MCV: 102.5 fL — ABNORMAL HIGH (ref 80.0–100.0)
Monocytes Absolute: 1.3 10*3/uL — ABNORMAL HIGH (ref 0.1–1.0)
Monocytes Relative: 7 %
Neutro Abs: 14.8 10*3/uL — ABNORMAL HIGH (ref 1.7–7.7)
Neutrophils Relative %: 83 %
Platelets: 343 10*3/uL (ref 150–400)
RBC: 3.21 MIL/uL — ABNORMAL LOW (ref 3.87–5.11)
RDW: 14.6 % (ref 11.5–15.5)
WBC: 18 10*3/uL — ABNORMAL HIGH (ref 4.0–10.5)
nRBC: 0 % (ref 0.0–0.2)

## 2020-11-26 LAB — BLOOD CULTURE ID PANEL (REFLEXED) - BCID2

## 2020-11-26 LAB — I-STAT ARTERIAL BLOOD GAS, ED
Acid-base deficit: 6 mmol/L — ABNORMAL HIGH (ref 0.0–2.0)
Bicarbonate: 17.3 mmol/L — ABNORMAL LOW (ref 20.0–28.0)
Calcium, Ion: 1.17 mmol/L (ref 1.15–1.40)
HCT: 33 % — ABNORMAL LOW (ref 36.0–46.0)
Hemoglobin: 11.2 g/dL — ABNORMAL LOW (ref 12.0–15.0)
O2 Saturation: 95 %
Patient temperature: 101.6
Potassium: 4.9 mmol/L (ref 3.5–5.1)
Sodium: 137 mmol/L (ref 135–145)
TCO2: 18 mmol/L — ABNORMAL LOW (ref 22–32)
pCO2 arterial: 27.5 mmHg — ABNORMAL LOW (ref 32.0–48.0)
pH, Arterial: 7.414 (ref 7.350–7.450)
pO2, Arterial: 81 mmHg — ABNORMAL LOW (ref 83.0–108.0)

## 2020-11-26 LAB — BASIC METABOLIC PANEL
Anion gap: 12 (ref 5–15)
BUN: 35 mg/dL — ABNORMAL HIGH (ref 8–23)
CO2: 18 mmol/L — ABNORMAL LOW (ref 22–32)
Calcium: 8.5 mg/dL — ABNORMAL LOW (ref 8.9–10.3)
Chloride: 107 mmol/L (ref 98–111)
Creatinine, Ser: 1.75 mg/dL — ABNORMAL HIGH (ref 0.44–1.00)
GFR, Estimated: 28 mL/min — ABNORMAL LOW (ref >60)
Glucose, Bld: 229 mg/dL — ABNORMAL HIGH (ref 70–99)
Potassium: 5.3 mmol/L — ABNORMAL HIGH (ref 3.5–5.1)
Sodium: 137 mmol/L (ref 135–145)

## 2020-11-26 LAB — HEPATIC FUNCTION PANEL
ALT: 54 U/L — ABNORMAL HIGH (ref 0–44)
AST: 53 U/L — ABNORMAL HIGH (ref 15–41)
Albumin: 2.5 g/dL — ABNORMAL LOW (ref 3.5–5.0)
Alkaline Phosphatase: 123 U/L (ref 38–126)
Bilirubin, Direct: 0.3 mg/dL — ABNORMAL HIGH (ref 0.0–0.2)
Indirect Bilirubin: 0.8 mg/dL (ref 0.3–0.9)
Total Bilirubin: 1.1 mg/dL (ref 0.3–1.2)
Total Protein: 5.9 g/dL — ABNORMAL LOW (ref 6.5–8.1)

## 2020-11-26 LAB — CBG MONITORING, ED
Glucose-Capillary: 202 mg/dL — ABNORMAL HIGH (ref 70–99)
Glucose-Capillary: 212 mg/dL — ABNORMAL HIGH (ref 70–99)

## 2020-11-26 LAB — VITAMIN B12: Vitamin B-12: 1020 pg/mL — ABNORMAL HIGH (ref 180–914)

## 2020-11-26 LAB — GLUCOSE, CAPILLARY
Glucose-Capillary: 202 mg/dL — ABNORMAL HIGH (ref 70–99)
Glucose-Capillary: 205 mg/dL — ABNORMAL HIGH (ref 70–99)
Glucose-Capillary: 226 mg/dL — ABNORMAL HIGH (ref 70–99)

## 2020-11-26 LAB — LACTIC ACID, PLASMA: Lactic Acid, Venous: 1.5 mmol/L (ref 0.5–1.9)

## 2020-11-26 MED ORDER — POLYETHYLENE GLYCOL 3350 17 G PO PACK: 17.0000 g | PACK | Freq: Every day | ORAL | Status: DC | PRN

## 2020-11-26 MED ORDER — APIXABAN 2.5 MG PO TABS
2.5000 mg | ORAL_TABLET | Freq: Two times a day (BID) | ORAL | Status: DC
  Administered 2020-11-26 – 2020-12-01 (×12): 2.5 mg via ORAL
  Filled 2020-11-26 (×12): qty 1

## 2020-11-26 MED ORDER — INSULIN ASPART 100 UNIT/ML IJ SOLN
0.0000 [IU] | Freq: Every day | INTRAMUSCULAR | Status: DC
  Administered 2020-11-26: 2 [IU] via SUBCUTANEOUS
  Administered 2020-11-27: 3 [IU] via SUBCUTANEOUS
  Administered 2020-11-28: 2 [IU] via SUBCUTANEOUS
  Administered 2020-11-29: 3 [IU] via SUBCUTANEOUS
  Administered 2020-11-30: 2 [IU] via SUBCUTANEOUS

## 2020-11-26 MED ORDER — TIMOLOL MALEATE 0.5 % OP SOLN
1.0000 [drp] | Freq: Every morning | OPHTHALMIC | Status: DC
  Administered 2020-11-26 – 2020-12-01 (×6): 1 [drp] via OPHTHALMIC
  Filled 2020-11-26: qty 5

## 2020-11-26 MED ORDER — ADULT MULTIVITAMIN W/MINERALS CH
1.0000 | ORAL_TABLET | Freq: Every day | ORAL | Status: DC
  Administered 2020-11-26 – 2020-12-01 (×6): 1 via ORAL
  Filled 2020-11-26 (×6): qty 1

## 2020-11-26 MED ORDER — VANCOMYCIN HCL IN DEXTROSE 1-5 GM/200ML-% IV SOLN: 1000.0000 mg | INTRAVENOUS | Status: DC

## 2020-11-26 MED ORDER — AMLODIPINE BESYLATE 5 MG PO TABS
5.0000 mg | ORAL_TABLET | Freq: Every day | ORAL | Status: DC
  Administered 2020-11-26 – 2020-12-01 (×6): 5 mg via ORAL
  Filled 2020-11-26 (×6): qty 1

## 2020-11-26 MED ORDER — LEVOTHYROXINE SODIUM 25 MCG PO TABS
137.0000 ug | ORAL_TABLET | Freq: Every day | ORAL | Status: DC
  Administered 2020-11-26 – 2020-12-01 (×6): 137 ug via ORAL
  Filled 2020-11-26 (×6): qty 1

## 2020-11-26 MED ORDER — SENNA 8.6 MG PO TABS
2.0000 | ORAL_TABLET | Freq: Every day | ORAL | Status: DC
  Administered 2020-11-26 – 2020-11-30 (×6): 17.2 mg via ORAL
  Filled 2020-11-26 (×6): qty 2

## 2020-11-26 MED ORDER — METOPROLOL SUCCINATE ER 25 MG PO TB24
25.0000 mg | ORAL_TABLET | Freq: Every day | ORAL | Status: DC
  Administered 2020-11-26 – 2020-12-01 (×6): 25 mg via ORAL
  Filled 2020-11-26 (×6): qty 1

## 2020-11-26 MED ORDER — INSULIN ASPART 100 UNIT/ML IJ SOLN
0.0000 [IU] | Freq: Three times a day (TID) | INTRAMUSCULAR | Status: DC
  Administered 2020-11-26: 5 [IU] via SUBCUTANEOUS
  Administered 2020-11-27: 3 [IU] via SUBCUTANEOUS
  Administered 2020-11-27 (×2): 8 [IU] via SUBCUTANEOUS
  Administered 2020-11-28 (×2): 5 [IU] via SUBCUTANEOUS
  Administered 2020-11-28: 8 [IU] via SUBCUTANEOUS
  Administered 2020-11-29: 3 [IU] via SUBCUTANEOUS
  Administered 2020-11-29: 5 [IU] via SUBCUTANEOUS
  Administered 2020-11-29 – 2020-11-30 (×2): 8 [IU] via SUBCUTANEOUS
  Administered 2020-11-30 (×2): 3 [IU] via SUBCUTANEOUS
  Administered 2020-12-01: 5 [IU] via SUBCUTANEOUS
  Administered 2020-12-01 (×2): 3 [IU] via SUBCUTANEOUS

## 2020-11-26 MED ORDER — FERROUS SULFATE 325 (65 FE) MG PO TABS
325.0000 mg | ORAL_TABLET | Freq: Every day | ORAL | Status: DC
  Administered 2020-11-26 – 2020-12-01 (×6): 325 mg via ORAL
  Filled 2020-11-26 (×6): qty 1

## 2020-11-26 MED ORDER — APIXABAN 5 MG PO TABS: 5.0000 mg | ORAL_TABLET | Freq: Two times a day (BID) | ORAL | Status: DC

## 2020-11-26 MED ORDER — VANCOMYCIN HCL 1500 MG/300ML IV SOLN
1500.0000 mg | INTRAVENOUS | Status: DC
  Administered 2020-11-26: 1500 mg via INTRAVENOUS
  Filled 2020-11-26: qty 300

## 2020-11-26 MED ORDER — SODIUM CHLORIDE 0.9 % IV SOLN
2.0000 g | INTRAVENOUS | Status: DC
  Administered 2020-11-26 – 2020-11-27 (×2): 2 g via INTRAVENOUS
  Filled 2020-11-26 (×2): qty 20

## 2020-11-26 MED ORDER — OXYCODONE HCL 5 MG PO TABS
10.0000 mg | ORAL_TABLET | Freq: Four times a day (QID) | ORAL | Status: DC | PRN
  Administered 2020-11-26 – 2020-11-30 (×10): 10 mg via ORAL
  Filled 2020-11-26 (×12): qty 2

## 2020-11-26 MED ORDER — ANASTROZOLE 1 MG PO TABS
1.0000 mg | ORAL_TABLET | Freq: Every day | ORAL | Status: DC
  Administered 2020-11-26 – 2020-12-01 (×6): 1 mg via ORAL
  Filled 2020-11-26 (×7): qty 1

## 2020-11-26 MED ORDER — FUROSEMIDE 10 MG/ML IJ SOLN
20.0000 mg | Freq: Once | INTRAMUSCULAR | Status: AC
  Administered 2020-11-26: 20 mg via INTRAVENOUS
  Filled 2020-11-26: qty 2

## 2020-11-26 MED ORDER — DOXYCYCLINE HYCLATE 100 MG PO TABS
100.0000 mg | ORAL_TABLET | Freq: Two times a day (BID) | ORAL | Status: DC
  Administered 2020-11-26: 100 mg via ORAL
  Filled 2020-11-26: qty 1

## 2020-11-26 MED ORDER — DOCUSATE SODIUM 100 MG PO CAPS
100.0000 mg | ORAL_CAPSULE | Freq: Two times a day (BID) | ORAL | Status: DC
  Administered 2020-11-26 – 2020-12-01 (×10): 100 mg via ORAL
  Filled 2020-11-26 (×12): qty 1

## 2020-11-26 NOTE — Consult Note (Signed)
[facility]    Date of Admission:  11/25/2020     Reason for Consult: Proteus bacteremia     Referring Physician: Dr Loleta Books  Current antibiotics: Ceftriaxone 9/15-present Doxycycline 9/16-present  Previous antibiotics: Meropenem 9/15 Vancomycin 9/15  ASSESSMENT:    86 y.o. female admitted with:  Proteus bacteremia with severe sepsis: hopefully secondary to UTI as opposed to post-operative infection related to recent spine surgery.  She reports symptoms consistent with UTI and her UA is somewhat suggestive.  Will follow up her urine cultures to see if Proteus isolated here as well.   Recent PLIF with neurosurgery 11/17/20 with post-operative wound drainage CKD DM History of PPM due to symptomatic bradycardia  RECOMMENDATIONS:    Continue ceftriaxone 2 gm daily DC doxycycline since ceftriaxone has adequate skin flora coverage for prior wound drainage and her MRSA nares was negative pre-op Follow up blood cx are susceptibilities Follow up urine cx Monitor lumbar wound closely and any progression.  If new concerns develop would have low threshold to get further imaging Will follow   Principal Problem:   Gram-negative bacteremia Active Problems:   Chronic atrial fibrillation (HCC)   Breast cancer of upper-outer quadrant of left female breast (HCC)   Chronic diastolic CHF (congestive heart failure) (HCC)   Essential hypertension   Sepsis due to urinary tract infection (HCC)   Acute metabolic encephalopathy   Type 2 diabetes mellitus without complication, with long-term current use of insulin (HCC)   Hypothyroidism   CKD (chronic kidney disease), stage III (HCC)   Sepsis (HCC)   Back pain with history of spinal surgery   MEDICATIONS:    Scheduled Meds:  amLODipine  5 mg Oral Daily   anastrozole  1 mg Oral Daily   apixaban  2.5 mg Oral BID   docusate sodium  100 mg Oral BID   doxycycline  100 mg Oral Q12H   ferrous sulfate  325 mg Oral  Q breakfast   insulin aspart  0-9 Units Subcutaneous TID WC   insulin detemir  5 Units Subcutaneous QHS   levothyroxine  137 mcg Oral QAC breakfast   metoprolol succinate  25 mg Oral Daily   multivitamin with minerals  1 tablet Oral Daily   senna  2 tablet Oral QHS   sodium chloride flush  3 mL Intravenous Q12H   timolol  1 drop Both Eyes q morning   Continuous Infusions:  sodium chloride     cefTRIAXone (ROCEPHIN)  IV     PRN Meds:.sodium chloride, acetaminophen **OR** acetaminophen, oxyCODONE, polyethylene glycol, sodium chloride flush  HPI:    Summer Hawkins is a 86 y.o. female with a past medical history significant for atrial fibrillation on anticoagulation, diabetes, hypertension, hypothyroidism, bradycardia status post PPM placement, history of breast cancer, CKD (creatinine 2 years ago 1.6-1.8), and recent two-level PLIF by Dr. Arnoldo Morale 11/17/2020 who was subsequently discharged to skilled nursing on 11/22/2020.  Prior to discharge she was having some increased drainage from her lumbar wound and was given doxycycline at discharge.  According to family members the patient had been more confused and forgetful over the prior few days and the SNF felt that she was weaker and confused as well.  As a result she was sent to the emergency department where she was found to be febrile with a temp of 102.1, normotensive, saturating well on room air, and encephalopathic.  Labs showed leukocytosis with WBC 22.6, creatinine at baseline of 1.69, and lactic  acidosis of 2.7.  UA was consistent with possible infection with moderate pyuria (11-20 white cells) and many bacteria.  Urine and blood cultures were obtained and she was started on broad-spectrum antibiotics.  Her urine cultures are still pending, however, blood cultures are positive for gram-negative rods with Proteus detected on BC ID.  Her antibiotics have been narrowed to ceftriaxone 2 g every 24 hours.  She remains in the emergency department  earlier this morning where she is had some improvement in her mental status.  She is currently alert and oriented to her name, month, year, and city.  She knows she is in the hospital.  However, she thinks she just missed Easter with her family and her son who traveled from [place], [place].  She reports continued low back pain.  She is unsure regarding drainage from her wound.  She also reports in the past 3 to 4 days having increased burning with urination and dark-colored urine for which she thinks she is dehydrated.  She has not noticed any new chest pain, cough, shortness of breath, headache, neck pain, GI symptoms.  She has been trying to do her therapy exercises to help with strengthening.   Past Medical History:  Diagnosis Date   Atrial fibrillation ([place])    Breast cancer ([place])    Breast cancer, left ([place])    Chronic kidney disease    stage III - patient was unaware   Diabetes mellitus    Dyspnea    Dysrhythmia    Afib   H/O: hysterectomy    History of colonoscopy 01/25/2010   History of mammogram 08/04/2009   Hyperlipidemia    Hypertension    Hypothyroidism    Ketoacidosis, diabetic, no coma, non-insulin dependent    Type II   Vitamin B12 deficiency     Social History   Tobacco Use   Smoking status: Never   Smokeless tobacco: Never  Vaping Use   Vaping Use: Never used  Substance Use Topics   Alcohol use: No    Alcohol/week: 0.0 standard drinks   Drug use: No    Family History  Problem Relation Age of Onset   Heart failure Father 75       enlarged heart   Pneumonia Mother 46   Diabetes Mother 32   Breast cancer Sister    Cancer Neg Hx     Allergies  Allergen Reactions   Bee Venom Shortness Of Breath, Nausea And Vomiting and Other (See Comments)    Makes the patient feel faint, also   Penicillins Anaphylaxis, Hives, Swelling and Other (See Comments)    Has patient had a PCN reaction causing immediate rash, facial/tongue/throat swelling, SOB or  lightheadedness with hypotension: Yes Has patient had a PCN reaction causing severe rash involving mucus membranes or skin necrosis: No Has patient had a PCN reaction that required hospitalization: No Has patient had a PCN reaction occurring within the last 10 years: No If all of the above answers are "NO", then may proceed with Cephalosporin use.    Liraglutide     Other reaction(s): Rash at injection site   Lisinopril     Other reaction(s): cough   Zocor [Simvastatin]     Other reaction(s): memory changes    Review of Systems  All other systems reviewed and are negative. Except as noted in HPI.  OBJECTIVE:   Blood pressure 123/67, pulse 70, temperature 98 F (36.7 C), temperature source Oral, resp. rate 20, height '5\' 4"'$  (1.626 m), weight  85.3 kg, SpO2 99 %. Body mass index is 32.27 kg/m.  Physical Exam Constitutional:      General: She is not in acute distress. HENT:     Head: Normocephalic and atraumatic.     Mouth/Throat:     Mouth: Mucous membranes are moist.     Pharynx: Oropharynx is clear.  Eyes:     General: No scleral icterus.    Extraocular Movements: Extraocular movements intact.     Conjunctiva/sclera: Conjunctivae normal.  Cardiovascular:     Rate and Rhythm: Normal rate and regular rhythm.  Pulmonary:     Effort: Pulmonary effort is normal. No respiratory distress.     Breath sounds: Normal breath sounds.  Abdominal:     General: There is no distension.     Palpations: Abdomen is soft.     Tenderness: There is no abdominal tenderness. There is no guarding or rebound.  Musculoskeletal:     Cervical back: Normal range of motion and neck supple.  Skin:    General: Skin is warm and dry.     Findings: No rash.     Comments: Surgical wound with mild erythema.  No significant drainage appreciated and no TTP around the area.   Neurological:     General: No focal deficit present.     Mental Status: She is alert and oriented to person, place, and time.      Cranial Nerves: No cranial nerve deficit.     Motor: Weakness present.     Comments: Right lower extremity > Left lower extremity weakness.   Psychiatric:        Mood and Affect: Mood normal.        Behavior: Behavior normal.     Lab Results: Lab Results  Component Value Date   WBC 18.0 (H) 11/26/2020   HGB 10.2 (L) 11/26/2020   HCT 32.9 (L) 11/26/2020   MCV 102.5 (H) 11/26/2020   PLT 343 11/26/2020    Lab Results  Component Value Date   NA 137 11/26/2020   K 5.3 (H) 11/26/2020   CO2 18 (L) 11/26/2020   GLUCOSE 229 (H) 11/26/2020   BUN 35 (H) 11/26/2020   CREATININE 1.75 (H) 11/26/2020   CALCIUM 8.5 (L) 11/26/2020   GFRNONAA 28 (L) 11/26/2020   GFRAA 41 (L) 12/01/2018    Lab Results  Component Value Date   ALT 54 (H) 11/26/2020   AST 53 (H) 11/26/2020   ALKPHOS 123 11/26/2020   BILITOT 1.1 11/26/2020    No results found for: CRP  No results found for: ESRSEDRATE  I have reviewed the micro and lab results in Epic.  Imaging: CT HEAD WO CONTRAST (5MM)  Result Date: 11/25/2020 CLINICAL DATA:  Altered mental status. EXAM: CT HEAD WITHOUT CONTRAST TECHNIQUE: Contiguous axial images were obtained from the base of the skull through the vertex without intravenous contrast. COMPARISON:  November 15, 2018. FINDINGS: Brain: Mild chronic ischemic white matter disease is noted. No mass effect or midline shift is noted. Ventricular size is within normal limits. There is no evidence of mass lesion, hemorrhage or acute infarction. Vascular: No hyperdense vessel or unexpected calcification. Skull: Normal. Negative for fracture or focal lesion. Sinuses/Orbits: No acute finding. Other: None. IMPRESSION: No acute intracranial abnormality seen. Electronically Signed   By: Marijo Conception M.D.   On: 11/25/2020 12:55   DG Chest Port 1 View  Result Date: 11/25/2020 CLINICAL DATA:  Weakness EXAM: PORTABLE CHEST 1 VIEW COMPARISON:  12/03/2018 FINDINGS: Left pacer  remains in place, unchanged.  Cardiomegaly. No confluent opacities, effusions or edema. No acute bony abnormality. IMPRESSION: No active disease. Electronically Signed   By: Rolm Baptise M.D.   On: 11/25/2020 12:04     Imaging independently reviewed in Epic.  [facility] for Infectious Disease [facility] [phone] pager 11/26/2020, 10:44 AM

## 2020-11-26 NOTE — Progress Notes (Signed)
Inpatient Diabetes Program Recommendations  AACE/ADA: New Consensus Statement on Inpatient Glycemic Control (2015)  Target Ranges:  Prepandial:   less than 140 mg/dL      Peak postprandial:   less than 180 mg/dL (1-2 hours)      Critically ill patients:  140 - 180 mg/dL   Lab Results  Component Value Date   GLUCAP 202 (H) 11/26/2020   HGBA1C 7.0 (H) 11/25/2020    Review of Glycemic Control  Diabetes history: DM2 Outpatient Diabetes medications: Toujeo 15 units in am and 10 units QHS, metformin 850 BID Current orders for Inpatient glycemic control: Levemir 5 units QHS, Novolog 0-15 units TID with meals and 0-5 HS  HgbA1C - 7.0%  Inpatient Diabetes Program Recommendations:    Consider increasing Levemir to 10 units QHS.   Continue to follow glucose trends.   Thank you. Lorenda Peck, RD, LDN, CDE Inpatient Diabetes Coordinator [phone]

## 2020-11-26 NOTE — Progress Notes (Addendum)
Pharmacy Antibiotic Note  Summer Hawkins is a 86 y.o. female admitted on 11/25/2020 with sepsis 2/2 UTI. Presented to ED with weakness s/p lumbar fusion 9/7. Patient denied dysuria, urgency, abdominal pain or fever/chills. Pharmacy has been consulted for vancomycin dosing.   Patient has history of anaphylaxis with penicillin use.  WBC 22.6, Tmax 102.62F SCr 1.69 - unclear what baseline is  Plan: Give vancomycin 1500 mg x1, then 1000 mg every 48 hours after (calculated AUC 529, Scr used 1.69) Continue meropenem 1 gm every 12 hours Monitor renal function, CBC, vancomycin levels as needed, cultures/sensitivities, LOT and de-escalate as able  Temp (24hrs), Avg:100.7 F (38.2 C), Min:98 F (36.7 C), Max:102.1 F (38.9 C)  Recent Labs  Lab 11/25/20 1154 11/25/20 1156 11/25/20 2000 11/26/20 0205  WBC 22.6*  --   --   --   CREATININE 1.69*  --   --   --   LATICACIDVEN  --  2.7* 2.3* 1.5     Estimated Creatinine Clearance: 26.2 mL/min (A) (by C-G formula based on SCr of 1.69 mg/dL (H)).    Allergies  Allergen Reactions   Bee Venom Shortness Of Breath, Nausea And Vomiting and Other (See Comments)    Makes the patient feel faint, also   Penicillins Anaphylaxis, Hives, Swelling and Other (See Comments)    Has patient had a PCN reaction causing immediate rash, facial/tongue/throat swelling, SOB or lightheadedness with hypotension: Yes Has patient had a PCN reaction causing severe rash involving mucus membranes or skin necrosis: No Has patient had a PCN reaction that required hospitalization: No Has patient had a PCN reaction occurring within the last 10 years: No If all of the above answers are "NO", then may proceed with Cephalosporin use.    Liraglutide     Other reaction(s): Rash at injection site   Lisinopril     Other reaction(s): cough   Zocor [Simvastatin]     Other reaction(s): memory changes    Antimicrobials this admission: CTX 9/15 x1 Meropenem 9/15 >> Vancomycin  9/16 >>  Dose adjustments this admission: Meropenem 1 gm every 12 hours for CrCl 26 mL/min  Microbiology results: 9/15 BCx: pending 9/15 UCx: pending  Thank you for allowing pharmacy to be a part of this patient's care.  Melanie Crazier, Student Pharmacist  11/26/2020  5:58 AM  Please check [email or web address] for unit-specific pharmacy phone numbers.

## 2020-11-26 NOTE — Progress Notes (Signed)
PHARMACY - PHYSICIAN COMMUNICATION CRITICAL VALUE ALERT - BLOOD CULTURE IDENTIFICATION (BCID)  Summer Hawkins is an 86 y.o. female who presented to [facility] on 11/25/2020 with a chief complaint of weakness/confusion  Assessment:  Proteus (no ESBL) bacteremia possibly from urine, urine culture pending   Update 9/16: Micro lab called with blood culture update. Previously reported as GPR. There are not any gram-positives identified on culture this morning, only GNR.    Name of physician (or Provider) Contacted: ID team  Current antibiotics: Ceftriaxone 2g IV q24h  Changes to prescribed antibiotics recommended: No changes need to antibiotic regimen.   Results for orders placed or performed during the hospital encounter of 11/25/20  Blood Culture ID Panel (Reflexed) (Collected: 11/25/2020 11:54 AM)  Result Value Ref Range   Enterococcus faecalis NOT DETECTED NOT DETECTED   Enterococcus Faecium NOT DETECTED NOT DETECTED   Listeria monocytogenes NOT DETECTED NOT DETECTED   Staphylococcus species NOT DETECTED NOT DETECTED   Staphylococcus aureus (BCID) NOT DETECTED NOT DETECTED   Staphylococcus epidermidis NOT DETECTED NOT DETECTED   Staphylococcus lugdunensis NOT DETECTED NOT DETECTED   Streptococcus species NOT DETECTED NOT DETECTED   Streptococcus agalactiae NOT DETECTED NOT DETECTED   Streptococcus pneumoniae NOT DETECTED NOT DETECTED   Streptococcus pyogenes NOT DETECTED NOT DETECTED   A.calcoaceticus-baumannii NOT DETECTED NOT DETECTED   Bacteroides fragilis NOT DETECTED NOT DETECTED   Enterobacterales DETECTED (A) NOT DETECTED   Enterobacter cloacae complex NOT DETECTED NOT DETECTED   Escherichia coli NOT DETECTED NOT DETECTED   Klebsiella aerogenes NOT DETECTED NOT DETECTED   Klebsiella oxytoca NOT DETECTED NOT DETECTED   Klebsiella pneumoniae NOT DETECTED NOT DETECTED   Proteus species DETECTED (A) NOT DETECTED   Salmonella species NOT DETECTED NOT DETECTED   Serratia  marcescens NOT DETECTED NOT DETECTED   Haemophilus influenzae NOT DETECTED NOT DETECTED   Neisseria meningitidis NOT DETECTED NOT DETECTED   Pseudomonas aeruginosa NOT DETECTED NOT DETECTED   Stenotrophomonas maltophilia NOT DETECTED NOT DETECTED   Candida albicans NOT DETECTED NOT DETECTED   Candida auris NOT DETECTED NOT DETECTED   Candida glabrata NOT DETECTED NOT DETECTED   Candida krusei NOT DETECTED NOT DETECTED   Candida parapsilosis NOT DETECTED NOT DETECTED   Candida tropicalis NOT DETECTED NOT DETECTED   Cryptococcus neoformans/gattii NOT DETECTED NOT DETECTED   CTX-M ESBL NOT DETECTED NOT DETECTED   Carbapenem resistance IMP NOT DETECTED NOT DETECTED   Carbapenem resistance KPC NOT DETECTED NOT DETECTED   Carbapenem resistance NDM NOT DETECTED NOT DETECTED   Carbapenem resist OXA 48 LIKE NOT DETECTED NOT DETECTED   Carbapenem resistance VIM NOT DETECTED NOT DETECTED    Lestine Box, PharmD PGY2 Infectious Diseases Pharmacy Resident   Please check [email or web address] for unit-specific pharmacy phone numbers

## 2020-11-26 NOTE — Progress Notes (Signed)
   Providing Compassionate, Quality Care - Together   Subjective: Nurse reports patient recently arrived from ED. Patient is sleepy upon assessment.  Objective: Vital signs in last 24 hours: Temp:  [98 F (36.7 C)-102.1 F (38.9 C)] 98 F (36.7 C) (09/16 1028) Pulse Rate:  [65-81] 70 (09/16 1028) Resp:  [20-43] 20 (09/16 1028) BP: (116-157)/(54-132) 123/67 (09/16 1028) SpO2:  [94 %-100 %] 99 % (09/16 1028) Weight:  [85.3 kg] 85.3 kg (09/15 2228)  Intake/Output from previous day: 09/15 0701 - 09/16 0700 In: 1272.6 [P.O.:75; IV Piggyback:1197.6] Out: 150 [Urine:150] Intake/Output this shift: No intake/output data recorded.  Afebrile Responds to voice Oriented to self and place MAE, pain limits her ability to cooperate with an exam Incision with bruising and scab. OTA. Slight redness along the border. No drainage noted  Lab Results: Recent Labs    11/25/20 1154 11/26/20 0257 11/26/20 0626  WBC 22.6*  --  18.0*  HGB 11.5* 11.2* 10.2*  HCT 36.1 33.0* 32.9*  PLT 364  --  343   BMET Recent Labs    11/25/20 1154 11/26/20 0257 11/26/20 0626  NA 134* 137 137  K 5.1 4.9 5.3*  CL 102  --  107  CO2 20*  --  18*  GLUCOSE 275*  --  229*  BUN 33*  --  35*  CREATININE 1.69*  --  1.75*  CALCIUM 8.9  --  8.5*    Studies/Results: CT HEAD WO CONTRAST (5MM)  Result Date: 11/25/2020 CLINICAL DATA:  Altered mental status. EXAM: CT HEAD WITHOUT CONTRAST TECHNIQUE: Contiguous axial images were obtained from the base of the skull through the vertex without intravenous contrast. COMPARISON:  November 15, 2018. FINDINGS: Brain: Mild chronic ischemic white matter disease is noted. No mass effect or midline shift is noted. Ventricular size is within normal limits. There is no evidence of mass lesion, hemorrhage or acute infarction. Vascular: No hyperdense vessel or unexpected calcification. Skull: Normal. Negative for fracture or focal lesion. Sinuses/Orbits: No acute finding. Other:  None. IMPRESSION: No acute intracranial abnormality seen. Electronically Signed   By: Marijo Conception M.D.   On: 11/25/2020 12:55   DG Chest Port 1 View  Result Date: 11/25/2020 CLINICAL DATA:  Weakness EXAM: PORTABLE CHEST 1 VIEW COMPARISON:  12/03/2018 FINDINGS: Left pacer remains in place, unchanged. Cardiomegaly. No confluent opacities, effusions or edema. No acute bony abnormality. IMPRESSION: No active disease. Electronically Signed   By: Rolm Baptise M.D.   On: 11/25/2020 12:04    Assessment/Plan: Patient presented to the emergency department from SNF on 11/25/2020 with altered mental status. She underwent a two level lumbar fusion on 11/17/2020. Work up in the ED revealed UTI and sepsis. Surgical incision reported to have minimal draiange.    LOS: 0 days   -Urosepsis treatment per ID -Neurosurgery will continue to follow patient during this admission -Monitor surgical wound for s/s of infection   Viona Gilmore, DNP, AGNP-C Nurse Practitioner  [facility]. [address], [address], [place], [place] [address] P: [phone]    F: [phone]  11/26/2020, 11:14 AM

## 2020-11-26 NOTE — H&P (Signed)
PHARMACY - PHYSICIAN COMMUNICATION CRITICAL VALUE ALERT - BLOOD CULTURE IDENTIFICATION (BCID)  Summer Hawkins is an 86 y.o. female who presented to [facility] on 11/25/2020 with a chief complaint of weakness/confusion  Assessment:  Proteus (no ESBL) bacteremia possibly from urine, urine culture pending   Name of physician (or Provider) Contacted: Dr. Loleta Books  Current antibiotics: Vancomycin + meropenem  Changes to prescribed antibiotics recommended: Discontinue vancomycin and meropenem and start Ceftriaxone 2g IV q24h  Results for orders placed or performed during the hospital encounter of 11/25/20  Blood Culture ID Panel (Reflexed) (Collected: 11/25/2020 11:54 AM)  Result Value Ref Range   Enterococcus faecalis NOT DETECTED NOT DETECTED   Enterococcus Faecium NOT DETECTED NOT DETECTED   Listeria monocytogenes NOT DETECTED NOT DETECTED   Staphylococcus species NOT DETECTED NOT DETECTED   Staphylococcus aureus (BCID) NOT DETECTED NOT DETECTED   Staphylococcus epidermidis NOT DETECTED NOT DETECTED   Staphylococcus lugdunensis NOT DETECTED NOT DETECTED   Streptococcus species NOT DETECTED NOT DETECTED   Streptococcus agalactiae NOT DETECTED NOT DETECTED   Streptococcus pneumoniae NOT DETECTED NOT DETECTED   Streptococcus pyogenes NOT DETECTED NOT DETECTED   A.calcoaceticus-baumannii NOT DETECTED NOT DETECTED   Bacteroides fragilis NOT DETECTED NOT DETECTED   Enterobacterales DETECTED (A) NOT DETECTED   Enterobacter cloacae complex NOT DETECTED NOT DETECTED   Escherichia coli NOT DETECTED NOT DETECTED   Klebsiella aerogenes NOT DETECTED NOT DETECTED   Klebsiella oxytoca NOT DETECTED NOT DETECTED   Klebsiella pneumoniae NOT DETECTED NOT DETECTED   Proteus species DETECTED (A) NOT DETECTED   Salmonella species NOT DETECTED NOT DETECTED   Serratia marcescens NOT DETECTED NOT DETECTED   Haemophilus influenzae NOT DETECTED NOT DETECTED   Neisseria meningitidis NOT DETECTED NOT DETECTED    Pseudomonas aeruginosa NOT DETECTED NOT DETECTED   Stenotrophomonas maltophilia NOT DETECTED NOT DETECTED   Candida albicans NOT DETECTED NOT DETECTED   Candida auris NOT DETECTED NOT DETECTED   Candida glabrata NOT DETECTED NOT DETECTED   Candida krusei NOT DETECTED NOT DETECTED   Candida parapsilosis NOT DETECTED NOT DETECTED   Candida tropicalis NOT DETECTED NOT DETECTED   Cryptococcus neoformans/gattii NOT DETECTED NOT DETECTED   CTX-M ESBL NOT DETECTED NOT DETECTED   Carbapenem resistance IMP NOT DETECTED NOT DETECTED   Carbapenem resistance KPC NOT DETECTED NOT DETECTED   Carbapenem resistance NDM NOT DETECTED NOT DETECTED   Carbapenem resist OXA 48 LIKE NOT DETECTED NOT DETECTED   Carbapenem resistance VIM NOT DETECTED NOT DETECTED    Joetta Manners, PharmD, [facility] Emergency Medicine Clinical Pharmacist ED R[facility] Phone: [phone] Main RX: [phone]

## 2020-11-26 NOTE — Plan of Care (Signed)

## 2020-11-26 NOTE — ED Notes (Addendum)
Patient's mentation appears to be clearing up as she is now able to state not only her name but knows she is in the hospital because she is sick, she is able to tell me she recently had back surgery and correctly tells me the month, year and president. Previously she was only oriented to self and would speak about getting children to school or cooking.

## 2020-11-26 NOTE — Evaluation (Signed)
Clinical/Bedside Swallow Evaluation Patient Details  Name: Summer Hawkins MRN: [id number] Date of Birth: 03-Sep-1934  Today's Date: 11/26/2020 Time: SLP Start Time (ACUTE ONLY): 1705 SLP Stop Time (ACUTE ONLY): 1726 SLP Time Calculation (min) (ACUTE ONLY): 21 min  Past Medical History:  Past Medical History:  Diagnosis Date   Atrial fibrillation ([place])    Breast cancer ([place])    Breast cancer, left ([place])    Chronic kidney disease    stage III - patient was unaware   Diabetes mellitus    Dyspnea    Dysrhythmia    Afib   H/O: hysterectomy    History of colonoscopy 01/25/2010   History of mammogram 08/04/2009   Hyperlipidemia    Hypertension    Hypothyroidism    Ketoacidosis, diabetic, no coma, non-insulin dependent    Type II   Vitamin B12 deficiency    Past Surgical History:  Past Surgical History:  Procedure Laterality Date   ABDOMINAL HYSTERECTOMY     Bilateral foot surgery     BREAST EXCISIONAL BIOPSY Left 12/2015   BREAST LUMPECTOMY Left    2017   BREAST LUMPECTOMY WITH RADIOACTIVE SEED AND SENTINEL LYMPH NODE BIOPSY Left 01/19/2016   Procedure: LEFT BREAST LUMPECTOMY WITH RADIOACTIVE SEED AND SENTINEL LYMPH NODE BIOPSY;  Surgeon: Autumn Messing III, MD;  Location: [facility];  Service: General;  Laterality: Left;   BREAST LUMPECTOMY WITH RADIOACTIVE SEED LOCALIZATION Left 01/19/2016   CARDIOVERSION  02/27/2011   Procedure: CARDIOVERSION;  Surgeon: Loralie Champagne, MD;  Location: [facility];  Service: Cardiovascular;  Laterality: N/A;   LAPAROSCOPIC CHOLECYSTECTOMY     PACEMAKER IMPLANT N/A 12/02/2018   Procedure: PACEMAKER IMPLANT;  Surgeon: Evans Lance, MD;  Location: [facility];  Service: Cardiovascular;  Laterality: N/A;   ROTATOR CUFF REPAIR Left    TUBAL LIGATION     HPI:  Pt is an 86 y.o. female who presented to the ED on 11/25/2020 with a chief complaint of weakness/confusion. CT head negative & CXR negative. Dx UTI & sepsis. SLP consulted due to pt "coughing and  gagging when taking pills". PMH: atrial fibrillation on eliquis, chronic diastolic CHF, hypertension, hypothyroidism, bradycardia status post pacemaker placement, history of breast cancer, s/p two-level PLIF by Dr. Arnoldo Morale on 11/17/2020    Assessment / Plan / Recommendation  Clinical Impression  Pt was seen for bedside swallow evaluation and she reported that for the past 3-4 days she has been having difficulty with pills "not going all the way down" and "coming back up after taking about three". She also reported inconsistent coughing with p.o. intake. Oral mechanism exam was [facility] and dentition was adequate. She tolerated solids without overt s/sx of aspiration. Mastication and oral clearance were WNL and no symptoms of pharyngeal residue were noted. Pt demonstrated coughing once with thin liquids via straw, but consistently tolerated thin liquids via cup without overt s/sx of aspiration. A regular texture diet with thin liquids via cup is recommended. SLP will follow to assess tolerance and to determine need for further intervention. Due to the nature of pt's symptoms, SLP questions esophageal involvement; consider esophageal assessment if pt's symptoms persist. SLP Visit Diagnosis: Dysphagia, unspecified (R13.10)    Aspiration Risk  Mild aspiration risk    Diet Recommendation Regular;Thin liquid   Liquid Administration via: Cup;No straw Medication Administration: Whole meds with puree Supervision: Staff to assist with self feeding Compensations: Slow rate;Small sips/bites Postural Changes: Seated upright at 90 degrees    Other  Recommendations Oral  Care Recommendations: Oral care BID    Recommendations for follow up therapy are one component of a multi-disciplinary discharge planning process, led by the attending physician.  Recommendations may be updated based on patient status, additional functional criteria and insurance authorization.  Follow up Recommendations  (TBD)      Frequency and  Duration min 2x/week  1 week       Prognosis Prognosis for Safe Diet Advancement: Good      Swallow Study   General Date of Onset: 11/25/20 HPI: Pt is an 86 y.o. female who presented to the ED on 11/25/2020 with a chief complaint of weakness/confusion. CT head negative & CXR negative. Dx UTI & sepsis. SLP consulted due to pt "coughing and gagging when taking pills". PMH: atrial fibrillation on eliquis, chronic diastolic CHF, hypertension, hypothyroidism, bradycardia status post pacemaker placement, history of breast cancer, s/p two-level PLIF by Dr. Arnoldo Morale on 11/17/2020 Type of Study: Bedside Swallow Evaluation Previous Swallow Assessment: none Diet Prior to this Study: Regular;Thin liquids Temperature Spikes Noted: No Respiratory Status: Room air History of Recent Intubation: No Behavior/Cognition: Alert;Cooperative;Pleasant mood Oral Cavity Assessment: Within Functional Limits Oral Care Completed by SLP: No Oral Cavity - Dentition: Adequate natural dentition Vision: Functional for self-feeding Self-Feeding Abilities: Able to feed self Patient Positioning: Upright in bed;Postural control adequate for testing Baseline Vocal Quality: Normal Volitional Cough: Weak Volitional Swallow: Able to elicit    Oral/Motor/Sensory Function Overall Oral Motor/Sensory Function: Within functional limits   Ice Chips Ice chips: Within functional limits Presentation: Spoon   Thin Liquid Thin Liquid: Impaired Presentation: Straw;Cup Pharyngeal  Phase Impairments: Cough - Immediate    Nectar Thick Nectar Thick Liquid: Not tested   Honey Thick Honey Thick Liquid: Not tested   Puree Puree: Within functional limits Presentation: Spoon   Solid     Solid: Within functional limits Presentation: [place] [address], [place], [place] Office number [phone] Pager [phone]  Horton Marshall 11/26/2020,5:28 PM

## 2020-11-26 NOTE — Progress Notes (Signed)
[facility] PROGRESS NOTE    Summer Hawkins  AJG:[id number] DOB: 22-Feb-1935 DOA: 11/25/2020 PCP: Haywood Pao, MD      Brief Narrative:  Summer Hawkins ("hay-ling") is a 86 y.o. F with Afib on ELiquis, dCHF, hypothyroidism, SSS with PPM, HTN, BrCA, spouse recently deceased, and then lumbar decompression surgery by Dr. Arnoldo Morale 1 week ago who presented from SNF for confusion, weakness for 1 day.  In the ER, temp 102F, tachypneic, WBC 22K, confused, lactate 2.7, UA with bacteria.  Chest x-ray clear.  CT head unremarkable.  Neurosurgery evaluated wound, saw scant discharge, low  concern for postop infection.          Assessment & Plan:  Severe sepsis due to Proteus bacteremia likely due to UTI Presented with fever, leukocytosis, tachypnea, confusion/hallucinations and elevated lactate.  Cultures overnight growing proteus.  ID involved.  - Continue Rocephin - Appreciate ID assistance - Follow blood and urine cultures    Acute metabolic encpehaloapthy Due to sepsis.  Appears resolved this morning, she is oriented to me, situation and not hallucinating.  Recent lumbar surgery - Serial monitoring of post-op incision, looks okay now - Appreciate Neurosurgery assistance, hopefully this is not related to her surgery   Paroxysmal atrial fibrillation Sick sinus syndrome with pacemaker HR controlled -Continue metoprolol -Continue Eliquis  Hx Breast cancer -Continue anastrozole  Chronic diastolic CHF Feeling congested and short of breath after fluids - IV lasix once  Hypothyroidism -Continue levothyroxine  Diabetes, type 2 Glucoses elevated -Continue levemir, may need to increase (home dose higher) -Continue SS corrections, increase dose - Stop metformin given renal function  Hypertension BP controlled -Continue amlodipine, metoprolol -Hold irbesartan, spiro  Glaucoma -Continue eye drops  Anemia, normocytic -Continue iron   CKD IIIb Cr  stable relative to baseline 1.8-2.0  Hyperkalemia Mild - Furosemide and trend BMP  -Hold spironolactone, ARB        Disposition: Status is: Inpatient  Remains inpatient appropriate because:Altered mental status and IV treatments appropriate due to intensity of illness or inability to take PO  Dispo: The patient is from: SNF              Anticipated d/c is to: SNF              Patient currently is not medically stable to d/c.   Difficult to place patient No       Level of care: Med-Surg       MDM: The below labs and imaging reports were reviewed and summarized above.  Medication management as above.    DVT prophylaxis: apixaban (ELIQUIS) tablet 2.5 mg Start: 11/26/20 0100 apixaban (ELIQUIS) tablet 2.5 mg  Code Status: DNR Family Communication:     Consultants:    Procedures:    Antimicrobials:  Vanc/ Meropenem x1 9/15 Ceftriaxone 9/16 >>  Culture data:  9/15 blood culture x2 -- GNRs in 2/2 9/15 urine culture -- pending           Subjective: Feeling better.  Confusion is better.  No hallucinations.  No further fever this morning.  She is little bit short of breath, feels "congested", no swelling, no vomiting.  Objective: Vitals:   11/26/20 0942 11/26/20 1028 11/26/20 1235 11/26/20 1535  BP:  123/67 118/66 (!) 128/91  Pulse:  70 70 70  Resp:  _0 Temp: 98.5 F (36.9 C) 98 F (36.7 C) 97.6 F (36.4 C) 97.8 F (36.6 C)  TempSrc:  Oral Oral Oral  SpO2:  99% 99% 100%  Weight:      Height:        Intake/Output Summary (Last 24 hours) at 11/26/2020 1552 Last data filed at 11/26/2020 1043 Gross per 24 hour  Intake 1275 ml  Output 150 ml  Net 1125 ml   Filed Weights   11/25/20 2228  Weight: 85.3 kg    Examination: General appearance:  adult female, alert and in no acute distress.   HEENT: Anicteric, conjunctiva pink, lids and lashes normal. No nasal deformity, discharge, epistaxis.  Lips moist, dentures in place, oropharynx  moist, no oral lesions.   Skin: Warm and dry.  no jaundice.  No suspicious rashes or lesions. Cardiac: RRR, nl S1-S2, no murmurs appreciated.  Capillary refill is brisk.  JVP not visible.  No LE edema.  Radial  pulses 2+ and symmetric. Respiratory: Tachypneic.  CTAB without rales or wheezes. Abdomen: Abdomen soft.  no TTP. No ascites, distension, hepatosplenomegaly.   MSK: No deformities or effusions. Neuro: Awake and alert.  EOMI, moves all extremities with generalized weakness. Speech fluent.    Psych: Sensorium intact and responding to questions, attention normal. Affect appropriate.  Judgment and insight appear normal.    Data Reviewed: I have personally reviewed following labs and imaging studies:  CBC: Recent Labs  Lab 11/25/20 1154 11/26/20 0257 11/26/20 0626  WBC 22.6*  --  18.0*  NEUTROABS  --   --  14.8*  HGB 11.5* 11.2* 10.2*  HCT 36.1 33.0* 32.9*  MCV 101.1*  --  102.5*  PLT 364  --  622   Basic Metabolic Panel: Recent Labs  Lab 11/25/20 1154 11/26/20 0257 11/26/20 0626  NA 134* 137 137  K 5.1 4.9 5.3*  CL 102  --  107  CO2 20*  --  18*  GLUCOSE 275*  --  229*  BUN 33*  --  35*  CREATININE 1.69*  --  1.75*  CALCIUM 8.9  --  8.5*   GFR: Estimated Creatinine Clearance: 25.3 mL/min (A) (by C-G formula based on SCr of 1.75 mg/dL (H)). Liver Function Tests: Recent Labs  Lab 11/25/20 1154 11/26/20 0626  AST 29 53*  ALT 52* 54*  ALKPHOS 94 123  BILITOT 1.1 1.1  PROT 6.6 5.9*  ALBUMIN 2.9* 2.5*   No results for input(s): LIPASE, AMYLASE in the last 168 hours. Recent Labs  Lab 11/25/20 2029  AMMONIA 17   Coagulation Profile: No results for input(s): INR, PROTIME in the last 168 hours. Cardiac Enzymes: No results for input(s): CKTOTAL, CKMB, CKMBINDEX, TROPONINI in the last 168 hours. BNP (last 3 results) No results for input(s): PROBNP in the last 8760 hours. HbA1C: Recent Labs    11/25/20 2020  HGBA1C 7.0*   CBG: Recent Labs  Lab  11/22/20 1700 11/25/20 2230 11/26/20 0758 11/26/20 0808 11/26/20 1237  GLUCAP 162* 242* 202* 212* 202*   Lipid Profile: No results for input(s): CHOL, HDL, LDLCALC, TRIG, CHOLHDL, LDLDIRECT in the last 72 hours. Thyroid Function Tests: Recent Labs    11/25/20 2023  TSH 0.950   Anemia Panel: Recent Labs    11/25/20 2029  VITAMINB12 1,020*   Urine analysis:    Component Value Date/Time   COLORURINE YELLOW 11/25/2020 1304   APPEARANCEUR HAZY (A) 11/25/2020 1304   LABSPEC 1.016 11/25/2020 1304   PHURINE 5.0 11/25/2020 1304   GLUCOSEU NEGATIVE 11/25/2020 1304   HGBUR NEGATIVE 11/25/2020 1304   BILIRUBINUR NEGATIVE 11/25/2020 1304   [place] 11/25/2020 1304  PROTEINUR 30 (A) 11/25/2020 1304   NITRITE NEGATIVE 11/25/2020 1304   LEUKOCYTESUR MODERATE (A) 11/25/2020 1304   Sepsis Labs: _0 (procalcitonin:4,lacticacidven:4)  ) Recent Results (from the past 240 hour(s))  SARS Coronavirus 2 by RT PCR (hospital order, performed in [facility] hospital lab) Nasopharyngeal Nasopharyngeal Swab     Status: None   Collection Time: 11/17/20  9:43 AM   Specimen: Nasopharyngeal Swab  Result Value Ref Range Status   SARS Coronavirus 2 NEGATIVE NEGATIVE Final    Comment: (NOTE) SARS-CoV-2 target nucleic acids are NOT DETECTED.  The SARS-CoV-2 RNA is generally detectable in upper and lower respiratory specimens during the acute phase of infection. The lowest concentration of SARS-CoV-2 viral copies this assay can detect is 250 copies / mL. A negative result does not preclude SARS-CoV-2 infection and should not be used as the sole basis for treatment or other patient management decisions.  A negative result may occur with improper specimen collection / handling, submission of specimen other than nasopharyngeal swab, presence of viral mutation(s) within the areas targeted by this assay, and inadequate number of viral copies (<250 copies / mL). A negative result must  be combined with clinical observations, patient history, and epidemiological information.  Fact Sheet for Patients:   StrictlyIdeas.no  Fact Sheet for Healthcare Providers: BankingDealers.co.za  This test is not yet approved or  cleared by the [place] FDA and has been authorized for detection and/or diagnosis of SARS-CoV-2 by FDA under an Emergency Use Authorization (EUA).  This EUA will remain in effect (meaning this test can be used) for the duration of the COVID-19 declaration under Section 564(b)(1) of the Act, 21 U.S.C. section 360bbb-3(b)(1), unless the authorization is terminated or revoked sooner.  Performed at [facility], [place] [address], [place], [place] [address]   Surgical pcr screen     Status: None   Collection Time: 11/17/20 11:24 AM   Specimen: Nasal Mucosa; Nasal Swab  Result Value Ref Range Status   MRSA, PCR NEGATIVE NEGATIVE Final   Staphylococcus aureus NEGATIVE NEGATIVE Final    Comment: (NOTE) The Xpert SA Assay (FDA approved for NASAL specimens in patients 72 years of age and older), is one component of a comprehensive surveillance program. It is not intended to diagnose infection nor to guide or monitor treatment. Performed at [facility], [place] [address], [place], [place] [address]   SARS CORONAVIRUS 2 (TAT 6-24 HRS) Nasopharyngeal Nasopharyngeal Swab     Status: None   Collection Time: 11/21/20 11:49 AM   Specimen: Nasopharyngeal Swab  Result Value Ref Range Status   SARS Coronavirus 2 NEGATIVE NEGATIVE Final    Comment: (NOTE) SARS-CoV-2 target nucleic acids are NOT DETECTED.  The SARS-CoV-2 RNA is generally detectable in upper and lower respiratory specimens during the acute phase of infection. Negative results do not preclude SARS-CoV-2 infection, do not rule out co-infections with other pathogens, and should not be used as the sole basis for treatment or other patient  management decisions. Negative results must be combined with clinical observations, patient history, and epidemiological information. The expected result is Negative.  Fact Sheet for Patients: SugarRoll.be  Fact Sheet for Healthcare Providers: https://www.woods-mathews.com/  This test is not yet approved or cleared by the [place] FDA and  has been authorized for detection and/or diagnosis of SARS-CoV-2 by FDA under an Emergency Use Authorization (EUA). This EUA will remain  in effect (meaning this test can be used) for the duration of the COVID-19 declaration  under Se ction 564(b)(1) of the Act, 21 U.S.C. section 360bbb-3(b)(1), unless the authorization is terminated or revoked sooner.  Performed at [facility], [place] [address] [place], [place] [address]   Resp Panel by RT-PCR (Flu A&B, Covid) Nasopharyngeal Swab     Status: None   Collection Time: 11/25/20 11:22 AM   Specimen: Nasopharyngeal Swab; Nasopharyngeal(NP) swabs in vial transport medium  Result Value Ref Range Status   SARS Coronavirus 2 by RT PCR NEGATIVE NEGATIVE Final    Comment: (NOTE) SARS-CoV-2 target nucleic acids are NOT DETECTED.  The SARS-CoV-2 RNA is generally detectable in upper respiratory specimens during the acute phase of infection. The lowest concentration of SARS-CoV-2 viral copies this assay can detect is 138 copies/mL. A negative result does not preclude SARS-Cov-2 infection and should not be used as the sole basis for treatment or other patient management decisions. A negative result may occur with  improper specimen collection/handling, submission of specimen other than nasopharyngeal swab, presence of viral mutation(s) within the areas targeted by this assay, and inadequate number of viral copies(<138 copies/mL). A negative result must be combined with clinical observations, patient history, and epidemiological information. The expected  result is Negative.  Fact Sheet for Patients:  EntrepreneurPulse.com.au  Fact Sheet for Healthcare Providers:  IncredibleEmployment.be  This test is no t yet approved or cleared by the [place] FDA and  has been authorized for detection and/or diagnosis of SARS-CoV-2 by FDA under an Emergency Use Authorization (EUA). This EUA will remain  in effect (meaning this test can be used) for the duration of the COVID-19 declaration under Section 564(b)(1) of the Act, 21 U.S.C.section 360bbb-3(b)(1), unless the authorization is terminated  or revoked sooner.       Influenza A by PCR NEGATIVE NEGATIVE Final   Influenza B by PCR NEGATIVE NEGATIVE Final    Comment: (NOTE) The Xpert Xpress SARS-CoV-2/FLU/RSV plus assay is intended as an aid in the diagnosis of influenza from Nasopharyngeal swab specimens and should not be used as a sole basis for treatment. Nasal washings and aspirates are unacceptable for Xpert Xpress SARS-CoV-2/FLU/RSV testing.  Fact Sheet for Patients: EntrepreneurPulse.com.au  Fact Sheet for Healthcare Providers: IncredibleEmployment.be  This test is not yet approved or cleared by the [place] FDA and has been authorized for detection and/or diagnosis of SARS-CoV-2 by FDA under an Emergency Use Authorization (EUA). This EUA will remain in effect (meaning this test can be used) for the duration of the COVID-19 declaration under Section 564(b)(1) of the Act, 21 U.S.C. section 360bbb-3(b)(1), unless the authorization is terminated or revoked.  Performed at [facility], [place] [address], [place], [place] [address]   Blood culture (routine x 2)     Status: Abnormal (Preliminary result)   Collection Time: 11/25/20 11:54 AM   Specimen: BLOOD RIGHT ARM  Result Value Ref Range Status   Specimen Description BLOOD RIGHT ARM  Final   Special Requests   Final    BOTTLES DRAWN AEROBIC AND  ANAEROBIC Blood Culture adequate volume   Culture  Setup Time   Final    GRAM POSITIVE RODS IN BOTH AEROBIC AND ANAEROBIC BOTTLES CRITICAL RESULT CALLED TO, READ BACK BY AND VERIFIED WITH: C AMEND,PHARMD_0  11/26/20 [place] GRAM NEGATIVE RODS    Culture (A)  Final    PROTEUS MIRABILIS SUSCEPTIBILITIES TO FOLLOW Performed at [facility], [place] [address], [place], [place] [address]    Report Status PENDING  Incomplete  Blood Culture ID Panel (  Reflexed)     Status: Abnormal   Collection Time: 11/25/20 11:54 AM  Result Value Ref Range Status   Enterococcus faecalis NOT DETECTED NOT DETECTED Final   Enterococcus Faecium NOT DETECTED NOT DETECTED Final   Listeria monocytogenes NOT DETECTED NOT DETECTED Final   Staphylococcus species NOT DETECTED NOT DETECTED Final   Staphylococcus aureus (BCID) NOT DETECTED NOT DETECTED Final   Staphylococcus epidermidis NOT DETECTED NOT DETECTED Final   Staphylococcus lugdunensis NOT DETECTED NOT DETECTED Final   Streptococcus species NOT DETECTED NOT DETECTED Final   Streptococcus agalactiae NOT DETECTED NOT DETECTED Final   Streptococcus pneumoniae NOT DETECTED NOT DETECTED Final   Streptococcus pyogenes NOT DETECTED NOT DETECTED Final   A.calcoaceticus-baumannii NOT DETECTED NOT DETECTED Final   Bacteroides fragilis NOT DETECTED NOT DETECTED Final   Enterobacterales DETECTED (A) NOT DETECTED Final    Comment: Enterobacterales represent a large order of gram negative bacteria, not a single organism. CRITICAL RESULT CALLED TO, READ BACK BY AND VERIFIED WITH: C AMEND,PHARMD_0  11/26/20 [place]    Enterobacter cloacae complex NOT DETECTED NOT DETECTED Final   Escherichia coli NOT DETECTED NOT DETECTED Final   Klebsiella aerogenes NOT DETECTED NOT DETECTED Final   Klebsiella oxytoca NOT DETECTED NOT DETECTED Final   Klebsiella pneumoniae NOT DETECTED NOT DETECTED Final   Proteus species DETECTED (A) NOT DETECTED Final    Comment: CRITICAL RESULT  CALLED TO, READ BACK BY AND VERIFIED WITH: C AMNED,PHARMD_1  11/26/20 [place]    Salmonella species NOT DETECTED NOT DETECTED Final   Serratia marcescens NOT DETECTED NOT DETECTED Final   Haemophilus influenzae NOT DETECTED NOT DETECTED Final   Neisseria meningitidis NOT DETECTED NOT DETECTED Final   Pseudomonas aeruginosa NOT DETECTED NOT DETECTED Final   Stenotrophomonas maltophilia NOT DETECTED NOT DETECTED Final   Candida albicans NOT DETECTED NOT DETECTED Final   Candida auris NOT DETECTED NOT DETECTED Final   Candida glabrata NOT DETECTED NOT DETECTED Final   Candida krusei NOT DETECTED NOT DETECTED Final   Candida parapsilosis NOT DETECTED NOT DETECTED Final   Candida tropicalis NOT DETECTED NOT DETECTED Final   Cryptococcus neoformans/gattii NOT DETECTED NOT DETECTED Final   CTX-M ESBL NOT DETECTED NOT DETECTED Final   Carbapenem resistance IMP NOT DETECTED NOT DETECTED Final   Carbapenem resistance KPC NOT DETECTED NOT DETECTED Final   Carbapenem resistance NDM NOT DETECTED NOT DETECTED Final   Carbapenem resist OXA 48 LIKE NOT DETECTED NOT DETECTED Final   Carbapenem resistance VIM NOT DETECTED NOT DETECTED Final    Comment: Performed at [facility], [address] [place], [place] [address]  Blood culture (routine x 2)     Status: None (Preliminary result)   Collection Time: 11/25/20  2:39 PM   Specimen: BLOOD  Result Value Ref Range Status   Specimen Description BLOOD SITE NOT SPECIFIED  Final   Special Requests BOTTLES DRAWN AEROBIC ONLY  Final   Culture  Setup Time   Final    GRAM NEGATIVE RODS AEROBIC BOTTLE ONLY CRITICAL VALUE NOTED.  VALUE IS CONSISTENT WITH PREVIOUSLY REPORTED AND CALLED VALUE. Performed at [facility], [place] [address], [place], [place] [address]    Culture GRAM NEGATIVE RODS  Final   Report Status PENDING  Incomplete         Radiology Studies: CT HEAD WO CONTRAST (5MM)  Result Date: 11/25/2020 CLINICAL DATA:  Altered mental  status. EXAM: CT HEAD WITHOUT CONTRAST TECHNIQUE: Contiguous axial images were obtained from the base of  the skull through the vertex without intravenous contrast. COMPARISON:  November 15, 2018. FINDINGS: Brain: Mild chronic ischemic white matter disease is noted. No mass effect or midline shift is noted. Ventricular size is within normal limits. There is no evidence of mass lesion, hemorrhage or acute infarction. Vascular: No hyperdense vessel or unexpected calcification. Skull: Normal. Negative for fracture or focal lesion. Sinuses/Orbits: No acute finding. Other: None. IMPRESSION: No acute intracranial abnormality seen. Electronically Signed   By: Marijo Conception M.D.   On: 11/25/2020 12:55   DG Chest Port 1 View  Result Date: 11/25/2020 CLINICAL DATA:  Weakness EXAM: PORTABLE CHEST 1 VIEW COMPARISON:  12/03/2018 FINDINGS: Left pacer remains in place, unchanged. Cardiomegaly. No confluent opacities, effusions or edema. No acute bony abnormality. IMPRESSION: No active disease. Electronically Signed   By: Rolm Baptise M.D.   On: 11/25/2020 12:04        Scheduled Meds:  amLODipine  5 mg Oral Daily   anastrozole  1 mg Oral Daily   apixaban  2.5 mg Oral BID   docusate sodium  100 mg Oral BID   ferrous sulfate  325 mg Oral Q breakfast   insulin aspart  0-9 Units Subcutaneous TID WC   insulin detemir  5 Units Subcutaneous QHS   levothyroxine  137 mcg Oral QAC breakfast   metoprolol succinate  25 mg Oral Daily   multivitamin with minerals  1 tablet Oral Daily   senna  2 tablet Oral QHS   sodium chloride flush  3 mL Intravenous Q12H   timolol  1 drop Both Eyes q morning   Continuous Infusions:  sodium chloride     cefTRIAXone (ROCEPHIN)  IV       LOS: 0 days    Time spent: 35 minutes    Edwin Dada, MD [facility] 11/26/2020, 3:52 PM     Please page though AMION or Epic secure chat:  For [employer], Adult nurse

## 2020-11-26 NOTE — ED Notes (Signed)
Attempted to call report to floor but given patient's respiratory rate is 30-40s with MEWs score 5 the floor has declined for her to be admitted. MD on call was paged and floor changed to progressive care unit.

## 2020-11-26 NOTE — ED Notes (Signed)
Attempted to stick for lactic acid but unsuccessful x2. Phlebotomy notified to come stick.

## 2020-11-26 NOTE — Significant Event (Signed)
Patient seen and examined at bedside.  Still febrile with temperatures of 102 F but not tachycardic and respiratory rate at the time of my exam was around 20/min.  Patient is following commands.  I have reviewed patient's charts labs.  We will add vancomycin to present antibiotics.  Follow cultures.  Gean Birchwood

## 2020-11-27 DIAGNOSIS — C50412 Malignant neoplasm of upper-outer quadrant of left female breast: Secondary | ICD-10-CM

## 2020-11-27 DIAGNOSIS — R4182 Altered mental status, unspecified: Secondary | ICD-10-CM | POA: Diagnosis not present

## 2020-11-27 DIAGNOSIS — Z170 Estrogen receptor positive status [ER+]: Secondary | ICD-10-CM

## 2020-11-27 DIAGNOSIS — R7881 Bacteremia: Secondary | ICD-10-CM | POA: Diagnosis not present

## 2020-11-27 DIAGNOSIS — N390 Urinary tract infection, site not specified: Secondary | ICD-10-CM | POA: Diagnosis not present

## 2020-11-27 LAB — BASIC METABOLIC PANEL
Anion gap: 12 (ref 5–15)
BUN: 35 mg/dL — ABNORMAL HIGH (ref 8–23)
CO2: 20 mmol/L — ABNORMAL LOW (ref 22–32)
Calcium: 8.4 mg/dL — ABNORMAL LOW (ref 8.9–10.3)
Chloride: 103 mmol/L (ref 98–111)
Creatinine, Ser: 1.68 mg/dL — ABNORMAL HIGH (ref 0.44–1.00)
GFR, Estimated: 30 mL/min — ABNORMAL LOW (ref >60)
Glucose, Bld: 206 mg/dL — ABNORMAL HIGH (ref 70–99)
Potassium: 5.2 mmol/L — ABNORMAL HIGH (ref 3.5–5.1)
Sodium: 135 mmol/L (ref 135–145)

## 2020-11-27 LAB — CBC
HCT: 30.2 % — ABNORMAL LOW (ref 36.0–46.0)
Hemoglobin: 10 g/dL — ABNORMAL LOW (ref 12.0–15.0)
MCH: 31.9 pg (ref 26.0–34.0)
MCHC: 33.1 g/dL (ref 30.0–36.0)
MCV: 96.5 fL (ref 80.0–100.0)
Platelets: 310 10*3/uL (ref 150–400)
RBC: 3.13 MIL/uL — ABNORMAL LOW (ref 3.87–5.11)
RDW: 14.6 % (ref 11.5–15.5)
WBC: 13.4 10*3/uL — ABNORMAL HIGH (ref 4.0–10.5)
nRBC: 0 % (ref 0.0–0.2)

## 2020-11-27 LAB — CULTURE, BLOOD (ROUTINE X 2): Special Requests: ADEQUATE

## 2020-11-27 LAB — GLUCOSE, CAPILLARY
Glucose-Capillary: 187 mg/dL — ABNORMAL HIGH (ref 70–99)
Glucose-Capillary: 178 mg/dL — ABNORMAL HIGH (ref 70–99)
Glucose-Capillary: 256 mg/dL — ABNORMAL HIGH (ref 70–99)
Glucose-Capillary: 272 mg/dL — ABNORMAL HIGH (ref 70–99)
Glucose-Capillary: 259 mg/dL — ABNORMAL HIGH (ref 70–99)

## 2020-11-27 MED ORDER — SODIUM ZIRCONIUM CYCLOSILICATE 10 G PO PACK
10.0000 g | PACK | Freq: Every day | ORAL | Status: AC
  Administered 2020-11-27: 10 g via ORAL
  Filled 2020-11-27: qty 1

## 2020-11-27 NOTE — Progress Notes (Signed)
Pts caregivers visiting and took pts purse which included wallet and keys. Pts granddaughter Summer Hawkins also aware.

## 2020-11-27 NOTE — Progress Notes (Signed)
Brief ID note:  Patient admitted out of the emergency department to floor on 5 W.  Her fevers have improved with a T-max over the last 24 hours of 98.9 F.  She remains normotensive.  Labs this morning show stable creatinine, mild hyperkalemia, and improving leukocytosis.  Her blood culture results from admission have a pansensitive Proteus mirabilis and her urine cultures also have greater than 100,000 colonies per mL of gram-negative rods with identification and susceptibility to follow.  She appears to be responding appropriately to treatment of her bacteremia and sepsis that is believed to be secondary to a UTI.  Will continue with current antimicrobials ceftriaxone 2 g daily until urine cultures result.  If this isolates the same Proteus species would de-escalate to cefazolin.  Continue to monitor her lumbar wound closely.  Will follow-up.   Raynelle [place] for [facility] 11/27/2020, 9:10 AM

## 2020-11-27 NOTE — Progress Notes (Signed)
  NEUROSURGERY PROGRESS NOTE   No issues overnight. Pt cont to c/o back soreness.   EXAM:  BP 116/62 (BP Location: Right Arm)   Pulse 73   Temp 98.9 F (37.2 C) (Axillary)   Resp 20   Ht '5\' 4"'$  (1.626 m)   Wt 95.1 kg   LMP  (LMP Unknown)   SpO2 99%   BMI 35.99 kg/m   Awake, alert, oriented  Speech fluent, appropriate  CN grossly intact  MAE well in bed Wound minimal drainage, appears to have small superficial dehiscence on superior aspect of wound  IMPRESSION:  86 y.o. female with bacteremia, urosepsis responding to abx. Wound with mild drainage but not grossly infected at this point  PLAN: - Cont to monitor wound - Cont abx per ID   Consuella Lose, MD [facility]

## 2020-11-27 NOTE — Progress Notes (Signed)
PROGRESS NOTE        PATIENT DETAILS Name: Summer Hawkins Age: 86 y.o. Sex: female Date of Birth: 03-10-1935 Admit Date: 11/25/2020 Admitting Physician Edwin Dada, MD CM:[id number], Fransico Him, MD  Brief Narrative: Patient is a 85 y.o. female who went decompressive two-level lumbar spine surgery on 9/7-presented from SNF with fever/confusion-she was subsequently found to have severe sepsis due to Proteus bacteremia.  Subjective: Lying comfortably in bed-denies any chest pain or shortness of breath.  Objective: Vitals: Blood pressure 128/63, pulse 73, temperature 97.9 F (36.6 C), temperature source Oral, resp. rate 19, height '5\' 4"'$  (1.626 m), weight 95.1 kg, SpO2 100 %.   Exam: Gen Exam:Alert awake-not in any distress HEENT:atraumatic, normocephalic Chest: B/L clear to auscultation anteriorly CVS:S1S2 regular Abdomen:soft non tender, non distended Extremities:no edema Neurology: Non focal Skin: no rash  Pertinent Labs/Radiology: WBC: 13.4 Hb: 10.0 Creatinine: 1.68 K: 5.2  Blood culture: Proteus-sensitivity pending Urine Culture: Gram-negative rods  Assessment/Plan: Severe sepsis due to Proteus bacteremia/UTI: Sepsis physiology has resolved-on Rocephin-ID following.  Recommendations are to continue IV Rocephin until final culture results are obtained.  Acute metabolic encephalopathy: Due to sepsis-resolved.  CT head negative.  Completely awake and alert this morning.  Recent lumbar spine surgery: No evidence of wound infection at this point per neurosurgery-neurosurgery following.  Hyperkalemia: Mild-Aldactone/losartan on hold-we will give 1 dose of Lokelma.  PAF: Rate controlled-continue metoprolol and Eliquis.  Sick sinus syndrome-s/p PPM implantation: Continue telemetry monitoring.  Chronic diastolic heart failure: Euvolemic on exam.  CKD stage IIIb: Creatinine close to baseline  Normocytic anemia: Hemoglobin stable-follow  periodically.  HTN: BP controlled-continue amlodipine and metoprolol.  DM-2 (A1c 7.09/15): CBGs relatively stable-continue Levemir 5 units nightly and SSI-reassess 12/15.  Recent Labs    11/26/20 2106 11/27/20 0448 11/27/20 0739  GLUCAP 226* 187* 178*    Hypothyroidism: Continue Synthroid.  TSH is stable.  History of breast cancer: Continue Arimidex-stable for outpatient follow-up with her primary oncologist.  Obesity: Estimated body mass index is 35.99 kg/m as calculated from the following:   Height as of this encounter: '5\' 4"'$  (1.626 m).   Weight as of this encounter: 95.1 kg.    Procedures :None Consults:ID DVT Prophylaxis :Eliquis Code Status:Full code  Family Communication:Granddaughter-Jennifer-[phone]-updated over the phone on 9/17  Time spent: 25-minutes-Greater than 50% of this time was spent in counseling, explanation of diagnosis, planning of further management, and coordination of care.  Diet: Diet Order             Diet Carb Modified Fluid consistency: Thin; Room service appropriate? Yes  Diet effective now                      Disposition Plan: Status is: Inpatient  Remains inpatient appropriate because:Inpatient level of care appropriate due to severity of illness  Dispo: The patient is from: SNF              Anticipated d/c is to: Home              Patient currently is not medically stable to d/c.   Difficult to place patient No    Barriers to Discharge: Resolving sepsis physiology-on IV antibiotics-awaiting final culture results.  Awaiting PT/OT eval to determine safe disposition.  Antimicrobial agents: Anti-infectives (From admission, onward)    Start     Dose/Rate  Route Frequency Ordered Stop   11/28/20 0600  vancomycin (VANCOCIN) IVPB 1000 mg/200 mL premix  Status:  Discontinued        1,000 mg 200 mL/hr over 60 Minutes Intravenous Every 48 hours 11/26/20 0617 11/26/20 0751   11/26/20 1800  cefTRIAXone (ROCEPHIN) 2 g in  sodium chloride 0.9 % 100 mL IVPB        2 g 200 mL/hr over 30 Minutes Intravenous Every 24 hours 11/26/20 0753     11/26/20 0600  vancomycin (VANCOREADY) IVPB 1500 mg/300 mL  Status:  Discontinued        1,500 mg 150 mL/hr over 120 Minutes Intravenous STAT 11/26/20 0556 11/26/20 0858   11/26/20 0500  meropenem (MERREM) 1 g in sodium chloride 0.9 % 100 mL IVPB  Status:  Discontinued        1 g 200 mL/hr over 30 Minutes Intravenous Every 12 hours 11/25/20 1734 11/26/20 0751   11/26/20 0045  doxycycline (VIBRA-TABS) tablet 100 mg  Status:  Discontinued        100 mg Oral Every 12 hours 11/26/20 0040 11/26/20 1107   11/25/20 1715  meropenem (MERREM) 1 g in sodium chloride 0.9 % 100 mL IVPB        1 g 200 mL/hr over 30 Minutes Intravenous  Once 11/25/20 1710 11/25/20 1917   11/25/20 1400  cefTRIAXone (ROCEPHIN) 1 g in sodium chloride 0.9 % 100 mL IVPB        1 g 200 mL/hr over 30 Minutes Intravenous  Once 11/25/20 1347 11/25/20 1549        MEDICATIONS: Scheduled Meds:  amLODipine  5 mg Oral Daily   anastrozole  1 mg Oral Daily   apixaban  2.5 mg Oral BID   docusate sodium  100 mg Oral BID   ferrous sulfate  325 mg Oral Q breakfast   insulin aspart  0-15 Units Subcutaneous TID WC   insulin aspart  0-5 Units Subcutaneous QHS   insulin detemir  5 Units Subcutaneous QHS   levothyroxine  137 mcg Oral QAC breakfast   metoprolol succinate  25 mg Oral Daily   multivitamin with minerals  1 tablet Oral Daily   senna  2 tablet Oral QHS   sodium chloride flush  3 mL Intravenous Q12H   timolol  1 drop Both Eyes q morning   Continuous Infusions:  sodium chloride     cefTRIAXone (ROCEPHIN)  IV Stopped (11/26/20 1900)   PRN Meds:.sodium chloride, acetaminophen **OR** acetaminophen, oxyCODONE, polyethylene glycol, sodium chloride flush   I have personally reviewed following labs and imaging studies  LABORATORY DATA: CBC: Recent Labs  Lab 11/25/20 1154 11/26/20 0257 11/26/20 0626  11/27/20 0041  WBC 22.6*  --  18.0* 13.4*  NEUTROABS  --   --  14.8*  --   HGB 11.5* 11.2* 10.2* 10.0*  HCT 36.1 33.0* 32.9* 30.2*  MCV 101.1*  --  102.5* 96.5  PLT 364  --  343 [id number]    Basic Metabolic Panel: Recent Labs  Lab 11/25/20 1154 11/26/20 0257 11/26/20 0626 11/27/20 0041  NA 134* 137 137 135  K 5.1 4.9 5.3* 5.2*  CL 102  --  107 103  CO2 20*  --  18* 20*  GLUCOSE 275*  --  229* 206*  BUN 33*  --  35* 35*  CREATININE 1.69*  --  1.75* 1.68*  CALCIUM 8.9  --  8.5* 8.4*    GFR: Estimated Creatinine Clearance: 27.9 mL/min (A) (by  C-G formula based on SCr of 1.68 mg/dL (H)).  Liver Function Tests: Recent Labs  Lab 11/25/20 1154 11/26/20 0626  AST 29 53*  ALT 52* 54*  ALKPHOS 94 123  BILITOT 1.1 1.1  PROT 6.6 5.9*  ALBUMIN 2.9* 2.5*   No results for input(s): LIPASE, AMYLASE in the last 168 hours. Recent Labs  Lab 11/25/20 2029  AMMONIA 17    Coagulation Profile: No results for input(s): INR, PROTIME in the last 168 hours.  Cardiac Enzymes: No results for input(s): CKTOTAL, CKMB, CKMBINDEX, TROPONINI in the last 168 hours.  BNP (last 3 results) No results for input(s): PROBNP in the last 8760 hours.  Lipid Profile: No results for input(s): CHOL, HDL, LDLCALC, TRIG, CHOLHDL, LDLDIRECT in the last 72 hours.  Thyroid Function Tests: Recent Labs    11/25/20 2023  TSH 0.950    Anemia Panel: Recent Labs    11/25/20 2029  VITAMINB12 1,020*    Urine analysis:    Component Value Date/Time   COLORURINE YELLOW 11/25/2020 1304   APPEARANCEUR HAZY (A) 11/25/2020 1304   LABSPEC 1.016 11/25/2020 1304   PHURINE 5.0 11/25/2020 1304   GLUCOSEU NEGATIVE 11/25/2020 1304   HGBUR NEGATIVE 11/25/2020 1304   BILIRUBINUR NEGATIVE 11/25/2020 1304   KETONESUR NEGATIVE 11/25/2020 1304   PROTEINUR 30 (A) 11/25/2020 1304   NITRITE NEGATIVE 11/25/2020 1304   LEUKOCYTESUR MODERATE (A) 11/25/2020 1304    Sepsis Labs: Lactic Acid, Venous    Component  Value Date/Time   LATICACIDVEN 1.5 11/26/2020 0205    MICROBIOLOGY: Recent Results (from the past 240 hour(s))  SARS CORONAVIRUS 2 (TAT 6-24 HRS) Nasopharyngeal Nasopharyngeal Swab     Status: None   Collection Time: 11/21/20 11:49 AM   Specimen: Nasopharyngeal Swab  Result Value Ref Range Status   SARS Coronavirus 2 NEGATIVE NEGATIVE Final    Comment: (NOTE) SARS-CoV-2 target nucleic acids are NOT DETECTED.  The SARS-CoV-2 RNA is generally detectable in upper and lower respiratory specimens during the acute phase of infection. Negative results do not preclude SARS-CoV-2 infection, do not rule out co-infections with other pathogens, and should not be used as the sole basis for treatment or other patient management decisions. Negative results must be combined with clinical observations, patient history, and epidemiological information. The expected result is Negative.  Fact Sheet for Patients: SugarRoll.be  Fact Sheet for Healthcare Providers: https://www.woods-mathews.com/  This test is not yet approved or cleared by the [place] FDA and  has been authorized for detection and/or diagnosis of SARS-CoV-2 by FDA under an Emergency Use Authorization (EUA). This EUA will remain  in effect (meaning this test can be used) for the duration of the COVID-19 declaration under Se ction 564(b)(1) of the Act, 21 U.S.C. section 360bbb-3(b)(1), unless the authorization is terminated or revoked sooner.  Performed at [facility], [place] [address] [place], [place] [address]   Resp Panel by RT-PCR (Flu A&B, Covid) Nasopharyngeal Swab     Status: None   Collection Time: 11/25/20 11:22 AM   Specimen: Nasopharyngeal Swab; Nasopharyngeal(NP) swabs in vial transport medium  Result Value Ref Range Status   SARS Coronavirus 2 by RT PCR NEGATIVE NEGATIVE Final    Comment: (NOTE) SARS-CoV-2 target nucleic acids are NOT DETECTED.  The SARS-CoV-2  RNA is generally detectable in upper respiratory specimens during the acute phase of infection. The lowest concentration of SARS-CoV-2 viral copies this assay can detect is 138 copies/mL. A negative result does not preclude SARS-Cov-2 infection and should  not be used as the sole basis for treatment or other patient management decisions. A negative result may occur with  improper specimen collection/handling, submission of specimen other than nasopharyngeal swab, presence of viral mutation(s) within the areas targeted by this assay, and inadequate number of viral copies(<138 copies/mL). A negative result must be combined with clinical observations, patient history, and epidemiological information. The expected result is Negative.  Fact Sheet for Patients:  EntrepreneurPulse.com.au  Fact Sheet for Healthcare Providers:  IncredibleEmployment.be  This test is no t yet approved or cleared by the [place] FDA and  has been authorized for detection and/or diagnosis of SARS-CoV-2 by FDA under an Emergency Use Authorization (EUA). This EUA will remain  in effect (meaning this test can be used) for the duration of the COVID-19 declaration under Section 564(b)(1) of the Act, 21 U.S.C.section 360bbb-3(b)(1), unless the authorization is terminated  or revoked sooner.       Influenza A by PCR NEGATIVE NEGATIVE Final   Influenza B by PCR NEGATIVE NEGATIVE Final    Comment: (NOTE) The Xpert Xpress SARS-CoV-2/FLU/RSV plus assay is intended as an aid in the diagnosis of influenza from Nasopharyngeal swab specimens and should not be used as a sole basis for treatment. Nasal washings and aspirates are unacceptable for Xpert Xpress SARS-CoV-2/FLU/RSV testing.  Fact Sheet for Patients: EntrepreneurPulse.com.au  Fact Sheet for Healthcare Providers: IncredibleEmployment.be  This test is not yet approved or cleared by the  [place] FDA and has been authorized for detection and/or diagnosis of SARS-CoV-2 by FDA under an Emergency Use Authorization (EUA). This EUA will remain in effect (meaning this test can be used) for the duration of the COVID-19 declaration under Section 564(b)(1) of the Act, 21 U.S.C. section 360bbb-3(b)(1), unless the authorization is terminated or revoked.  Performed at [facility], [place] [address], [place], [place] [address]   Blood culture (routine x 2)     Status: Abnormal   Collection Time: 11/25/20 11:54 AM   Specimen: BLOOD RIGHT ARM  Result Value Ref Range Status   Specimen Description BLOOD RIGHT ARM  Final   Special Requests   Final    BOTTLES DRAWN AEROBIC AND ANAEROBIC Blood Culture adequate volume   Culture  Setup Time   Final    GRAM NEGATIVE RODS IN BOTH AEROBIC AND ANAEROBIC BOTTLES CRITICAL RESULT CALLED TO, READ BACK BY AND VERIFIED WITH: C AMEND,PHARMD'@0647'$  11/26/20 [place] CORRECTED RESULTS PREVIOUSLY REPORTED AS: GRAM POSITIVE RODS IN BOTH AEROBIC AND ANAEROBIC BOTTLES CORRECTED RESULTS CALLED TO: Vena [place] ON [id number] AT 0900 BY E.PARRISH Performed at [facility], [place] [address] [place], [place] [address]    Culture PROTEUS MIRABILIS (A)  Final   Report Status 11/27/2020 FINAL  Final   Organism ID, Bacteria PROTEUS MIRABILIS  Final      Susceptibility   Proteus mirabilis - MIC*    AMPICILLIN <=2 SENSITIVE Sensitive     CEFAZOLIN <=4 SENSITIVE Sensitive     CEFEPIME <=0.12 SENSITIVE Sensitive     CEFTAZIDIME <=1 SENSITIVE Sensitive     CEFTRIAXONE <=0.25 SENSITIVE Sensitive     CIPROFLOXACIN <=0.25 SENSITIVE Sensitive     GENTAMICIN <=1 SENSITIVE Sensitive     IMIPENEM 2 SENSITIVE Sensitive     TRIMETH/SULFA <=20 SENSITIVE Sensitive     AMPICILLIN/SULBACTAM <=2 SENSITIVE Sensitive     PIP/TAZO <=4 SENSITIVE Sensitive     * PROTEUS MIRABILIS  Blood Culture ID Panel (Reflexed)     Status: Abnormal  Collection Time: 11/25/20  11:54 AM  Result Value Ref Range Status   Enterococcus faecalis NOT DETECTED NOT DETECTED Final   Enterococcus Faecium NOT DETECTED NOT DETECTED Final   Listeria monocytogenes NOT DETECTED NOT DETECTED Final   Staphylococcus species NOT DETECTED NOT DETECTED Final   Staphylococcus aureus (BCID) NOT DETECTED NOT DETECTED Final   Staphylococcus epidermidis NOT DETECTED NOT DETECTED Final   Staphylococcus lugdunensis NOT DETECTED NOT DETECTED Final   Streptococcus species NOT DETECTED NOT DETECTED Final   Streptococcus agalactiae NOT DETECTED NOT DETECTED Final   Streptococcus pneumoniae NOT DETECTED NOT DETECTED Final   Streptococcus pyogenes NOT DETECTED NOT DETECTED Final   A.calcoaceticus-baumannii NOT DETECTED NOT DETECTED Final   Bacteroides fragilis NOT DETECTED NOT DETECTED Final   Enterobacterales DETECTED (A) NOT DETECTED Final    Comment: Enterobacterales represent a large order of gram negative bacteria, not a single organism. CRITICAL RESULT CALLED TO, READ BACK BY AND VERIFIED WITH: C AMEND,PHARMD'@0649'$  11/26/20 [place]    Enterobacter cloacae complex NOT DETECTED NOT DETECTED Final   Escherichia coli NOT DETECTED NOT DETECTED Final   Klebsiella aerogenes NOT DETECTED NOT DETECTED Final   Klebsiella oxytoca NOT DETECTED NOT DETECTED Final   Klebsiella pneumoniae NOT DETECTED NOT DETECTED Final   Proteus species DETECTED (A) NOT DETECTED Final    Comment: CRITICAL RESULT CALLED TO, READ BACK BY AND VERIFIED WITH: C AMNED,PHARMD'@0649'$  11/26/20 [place]    Salmonella species NOT DETECTED NOT DETECTED Final   Serratia marcescens NOT DETECTED NOT DETECTED Final   Haemophilus influenzae NOT DETECTED NOT DETECTED Final   Neisseria meningitidis NOT DETECTED NOT DETECTED Final   Pseudomonas aeruginosa NOT DETECTED NOT DETECTED Final   Stenotrophomonas maltophilia NOT DETECTED NOT DETECTED Final   Candida albicans NOT DETECTED NOT DETECTED Final   Candida auris NOT DETECTED NOT DETECTED Final    Candida glabrata NOT DETECTED NOT DETECTED Final   Candida krusei NOT DETECTED NOT DETECTED Final   Candida parapsilosis NOT DETECTED NOT DETECTED Final   Candida tropicalis NOT DETECTED NOT DETECTED Final   Cryptococcus neoformans/gattii NOT DETECTED NOT DETECTED Final   CTX-M ESBL NOT DETECTED NOT DETECTED Final   Carbapenem resistance IMP NOT DETECTED NOT DETECTED Final   Carbapenem resistance KPC NOT DETECTED NOT DETECTED Final   Carbapenem resistance NDM NOT DETECTED NOT DETECTED Final   Carbapenem resist OXA 48 LIKE NOT DETECTED NOT DETECTED Final   Carbapenem resistance VIM NOT DETECTED NOT DETECTED Final    Comment: Performed at [facility], [address], [place], [place] [address]  Blood culture (routine x 2)     Status: None (Preliminary result)   Collection Time: 11/25/20  2:39 PM   Specimen: BLOOD  Result Value Ref Range Status   Specimen Description BLOOD SITE NOT SPECIFIED  Final   Special Requests BOTTLES DRAWN AEROBIC ONLY  Final   Culture  Setup Time   Final    GRAM NEGATIVE RODS AEROBIC BOTTLE ONLY CRITICAL VALUE NOTED.  VALUE IS CONSISTENT WITH PREVIOUSLY REPORTED AND CALLED VALUE.    Culture   Final    GRAM NEGATIVE RODS IDENTIFICATION TO FOLLOW Performed at [facility], [place] [address] [place], [place] [address]    Report Status PENDING  Incomplete  Urine Culture     Status: Abnormal (Preliminary result)   Collection Time: 11/25/20  3:31 PM   Specimen: Urine, Clean Catch  Result Value Ref Range Status   Specimen Description URINE, CLEAN CATCH  Final  Special Requests NONE  Final   Culture (A)  Final    >=100,000 COLONIES/mL GRAM NEGATIVE RODS SUSCEPTIBILITIES TO FOLLOW Performed at [facility], [place] [address], [place], [place] [address]    Report Status PENDING  Incomplete    RADIOLOGY STUDIES/RESULTS: CT HEAD WO CONTRAST (5MM)  Result Date: 11/25/2020 CLINICAL DATA:  Altered mental status. EXAM: CT HEAD WITHOUT CONTRAST  TECHNIQUE: Contiguous axial images were obtained from the base of the skull through the vertex without intravenous contrast. COMPARISON:  November 15, 2018. FINDINGS: Brain: Mild chronic ischemic white matter disease is noted. No mass effect or midline shift is noted. Ventricular size is within normal limits. There is no evidence of mass lesion, hemorrhage or acute infarction. Vascular: No hyperdense vessel or unexpected calcification. Skull: Normal. Negative for fracture or focal lesion. Sinuses/Orbits: No acute finding. Other: None. IMPRESSION: No acute intracranial abnormality seen. Electronically Signed   By: Marijo Conception M.D.   On: 11/25/2020 12:55     LOS: 1 day   Oren Binet, MD  [facility]    To contact the attending provider between 7A-7P or the covering provider during after hours 7P-7A, please log into the web site [email or web address] and access using universal [place] password for that web site. If you do not have the password, please call the hospital operator.  11/27/2020, 12:06 PM

## 2020-11-28 DIAGNOSIS — R7881 Bacteremia: Secondary | ICD-10-CM | POA: Diagnosis not present

## 2020-11-28 DIAGNOSIS — N390 Urinary tract infection, site not specified: Secondary | ICD-10-CM | POA: Diagnosis not present

## 2020-11-28 DIAGNOSIS — C50412 Malignant neoplasm of upper-outer quadrant of left female breast: Secondary | ICD-10-CM | POA: Diagnosis not present

## 2020-11-28 DIAGNOSIS — R4182 Altered mental status, unspecified: Secondary | ICD-10-CM | POA: Diagnosis not present

## 2020-11-28 LAB — BASIC METABOLIC PANEL WITH GFR
Anion gap: 11 (ref 5–15)
BUN: 37 mg/dL — ABNORMAL HIGH (ref 8–23)
CO2: 21 mmol/L — ABNORMAL LOW (ref 22–32)
Calcium: 8.4 mg/dL — ABNORMAL LOW (ref 8.9–10.3)
Chloride: 101 mmol/L (ref 98–111)
Creatinine, Ser: 1.73 mg/dL — ABNORMAL HIGH (ref 0.44–1.00)
GFR, Estimated: 29 mL/min — ABNORMAL LOW
Glucose, Bld: 205 mg/dL — ABNORMAL HIGH (ref 70–99)
Potassium: 5 mmol/L (ref 3.5–5.1)
Sodium: 133 mmol/L — ABNORMAL LOW (ref 135–145)

## 2020-11-28 LAB — URINE CULTURE: Culture: >=100000 — AB

## 2020-11-28 LAB — CBC
HCT: 31.6 % — ABNORMAL LOW (ref 36.0–46.0)
Hemoglobin: 10.2 g/dL — ABNORMAL LOW (ref 12.0–15.0)
MCH: 31.4 pg (ref 26.0–34.0)
MCHC: 32.3 g/dL (ref 30.0–36.0)
MCV: 97.2 fL (ref 80.0–100.0)
Platelets: 346 10*3/uL (ref 150–400)
RBC: 3.25 MIL/uL — ABNORMAL LOW (ref 3.87–5.11)
RDW: 14.3 % (ref 11.5–15.5)
WBC: 13.5 10*3/uL — ABNORMAL HIGH (ref 4.0–10.5)
nRBC: 0 % (ref 0.0–0.2)

## 2020-11-28 LAB — GLUCOSE, CAPILLARY
Glucose-Capillary: 216 mg/dL — ABNORMAL HIGH (ref 70–99)
Glucose-Capillary: 247 mg/dL — ABNORMAL HIGH (ref 70–99)
Glucose-Capillary: 283 mg/dL — ABNORMAL HIGH (ref 70–99)
Glucose-Capillary: 248 mg/dL — ABNORMAL HIGH (ref 70–99)

## 2020-11-28 MED ORDER — CEFAZOLIN SODIUM-DEXTROSE 2-4 GM/100ML-% IV SOLN
2.0000 g | Freq: Two times a day (BID) | INTRAVENOUS | Status: DC
  Administered 2020-11-28 – 2020-11-30 (×4): 2 g via INTRAVENOUS
  Filled 2020-11-28 (×4): qty 100

## 2020-11-28 MED ORDER — INSULIN DETEMIR 100 UNIT/ML ~~LOC~~ SOLN
12.0000 [IU] | Freq: Every day | SUBCUTANEOUS | Status: DC
  Administered 2020-11-28 – 2020-11-30 (×3): 12 [IU] via SUBCUTANEOUS
  Filled 2020-11-28 (×4): qty 0.12

## 2020-11-28 MED ORDER — BENZONATATE 100 MG PO CAPS
200.0000 mg | ORAL_CAPSULE | Freq: Three times a day (TID) | ORAL | Status: DC
  Administered 2020-11-28 – 2020-12-01 (×10): 200 mg via ORAL
  Filled 2020-11-28 (×10): qty 2

## 2020-11-28 MED ORDER — ALUM & MAG HYDROXIDE-SIMETH 200-200-20 MG/5ML PO SUSP
30.0000 mL | ORAL | Status: DC | PRN
  Administered 2020-11-28: 30 mL via ORAL
  Filled 2020-11-28: qty 30

## 2020-11-28 NOTE — Progress Notes (Signed)
Pharmacy Antibiotic Note  Summer Hawkins is a 86 y.o. female admitted on 11/25/2020 with proteus bacteremia and klebsiella UTI. Penicillin allergy noted in chart, discussed with Dr. Juleen China. Pt has tolerated ceftriaxone for past 3 days and improving with treatment. Pharmacy has been consulted for cefazolin dosing.   Plan: Cefazolin 2g q 12h  F/u clinical status, renal function, and LOT   Height: '5\' 4"'$  (162.6 cm) Weight: 93.2 kg (205 lb 7.5 oz) IBW/kg (Calculated) : 54.7  Temp (24hrs), Avg:98.2 F (36.8 C), Min:97.9 F (36.6 C), Max:99 F (37.2 C)  Recent Labs  Lab 11/25/20 1154 11/25/20 1156 11/25/20 2000 11/26/20 0205 11/26/20 0626 11/27/20 0041 11/28/20 0100  WBC 22.6*  --   --   --  18.0* 13.4* 13.5*  CREATININE 1.69*  --   --   --  1.75* 1.68* 1.73*  LATICACIDVEN  --  2.7* 2.3* 1.5  --   --   --     Estimated Creatinine Clearance: 26.8 mL/min (A) (by C-G formula based on SCr of 1.73 mg/dL (H)).    Allergies  Allergen Reactions   Bee Venom Shortness Of Breath, Nausea And Vomiting and Other (See Comments)    Makes the patient feel faint, also   Penicillins Anaphylaxis, Hives, Swelling and Other (See Comments)    Has patient had a PCN reaction causing immediate rash, facial/tongue/throat swelling, SOB or lightheadedness with hypotension: Yes Has patient had a PCN reaction causing severe rash involving mucus membranes or skin necrosis: No Has patient had a PCN reaction that required hospitalization: No Has patient had a PCN reaction occurring within the last 10 years: No If all of the above answers are "NO", then may proceed with Cephalosporin use.    Liraglutide     Other reaction(s): Rash at injection site   Lisinopril     Other reaction(s): cough   Zocor [Simvastatin]     Other reaction(s): memory changes    Antimicrobials this admission: Cefazolin 9/18 > Ceftriaxone 9/15 > 9/17 Meropenem 9/15 x1 Vancomycin 9/16 x1  Microbiology results: 9/15 BCx: proteus  mirabilis (pan-sensitive) 9/15 UCx: k. Pneumoniae (R ampicillin, nitrofurantoin, Unasyn)  Thank you for allowing pharmacy to be a part of this patient's care.  Levonne Spiller 11/28/2020 9:21 AM

## 2020-11-28 NOTE — Progress Notes (Signed)
Physical Therapy Evaluation Patient Details Name: Summer Hawkins MRN: [id number] DOB: 06/18/1934 Today's Date: 11/28/2020  History of Present Illness  87 y.o. F admitted on 9/15 due to increased confusion and weakness. Pt with PMH significant for A fib,L3-4, L4-5 fusion on 11/17/20, breast ca, CKD stage III, DM, HTN, pacemaker, lumpectomy, RC repair.  Clinical Impression  Pt admitted with above diagnosis. Pt unable to stand with total assist. Could not move buttocks more than 1 inch off of bed. Pt appears to give little effort.  This PT saw her on previous admission just prior to her going to SNF and pt mobility similar that session today. Pt continues to need 24 hour care/SNF for therapy.  Pt currently with functional limitations due to the deficits listed below (see PT Problem List). Pt will benefit from skilled PT to increase their independence and safety with mobility to allow discharge to the venue listed below.          Recommendations for follow up therapy are one component of a multi-disciplinary discharge planning process, led by the attending physician.  Recommendations may be updated based on patient status, additional functional criteria and insurance authorization.  Follow Up Recommendations SNF;Supervision/Assistance - 24 hour    Equipment Recommendations  Other (comment) (TBA next venue)    Recommendations for Other Services       Precautions / Restrictions Precautions Precautions: Fall;Back Precaution Booklet Issued: No Precaution Comments: Pt had Lumbar fusion 10 days ago. Precautions reviewed, back brace not in room, PA Nundkumar reported that she was ok to move around in the room with no brace. Required Braces or Orthoses: Spinal Brace Spinal Brace: Lumbar corset Restrictions Weight Bearing Restrictions: No      Mobility  Bed Mobility Overal bed mobility: Needs Assistance Bed Mobility: Rolling;Sidelying to Sit;Sit to Supine Rolling: Max assist Sidelying to sit:  Max assist   Sit to supine: Max assist;+2 for safety/equipment;+2 for physical assistance Sit to sidelying: +2 for physical assistance;Max assist General bed mobility comments: Pt requiring max A to come to sitting, limited by pain    Transfers Overall transfer level: Needs assistance Equipment used: Rolling walker (2 wheeled) Transfers: Sit to/from Stand Sit to Stand: Total assist;From elevated surface         General transfer comment: Able to lift her bottom ~1 inch off the bed before having to sit back down, attempted 3x with very little effort given by pt. Unable to achieve standing evenw ith bed elevated and incr assist by PT.  Ambulation/Gait                Stairs            Wheelchair Mobility    Modified Rankin (Stroke Patients Only)       Balance Overall balance assessment: Needs assistance Sitting-balance support: Bilateral upper extremity supported;Feet supported Sitting balance-Leahy Scale: Poor Sitting balance - Comments: Pt requiring cues to lean forward as she leans posteriorly significantly at times. Pt minguard to max assist to sit eOB   Standing balance support: Bilateral upper extremity supported Standing balance-Leahy Scale: Zero Standing balance comment: Could not stand with total assist                             Pertinent Vitals/Pain Pain Assessment: Faces Pain Score: 8  Faces Pain Scale: Hurts even more Pain Location: back Pain Descriptors / Indicators: Aching;Discomfort;Grimacing;Moaning Pain Intervention(s): Limited activity within patient's tolerance;Monitored during session;Repositioned  Home Living Family/patient expects to be discharged to:: Skilled nursing facility Living Arrangements: Alone (friend available initially upon eventual return home) Available Help at Discharge: Friend(s);Available PRN/intermittently Type of Home: House Home Access: Stairs to enter Entrance Stairs-Rails: Left Entrance  Stairs-Number of Steps: 2 Home Layout: One level Home Equipment: Walker - 2 wheels;Grab bars - tub/shower;Shower seat;Hand held shower head Additional Comments: Pt reports that she wants to go home and not go back to the SNF she was at, however pt will not have caregivers available 24/7 to provide +2 max assist.    Prior Function Level of Independence: Needs assistance   Gait / Transfers Assistance Needed: At SNF, requires assist with all transfers, only able to tolerate a few steps of ambulation before needing to sit.  ADL's / Homemaking Assistance Needed: Gets mod-max A +2 assist with all ADL's  Comments: Prior to back surgery, pt was very limited by pain, but was living alone and able to take care of herself.     Hand Dominance   Dominant Hand: Right    Extremity/Trunk Assessment   Upper Extremity Assessment Upper Extremity Assessment: Defer to OT evaluation    Lower Extremity Assessment Lower Extremity Assessment: Generalized weakness    Cervical / Trunk Assessment Cervical / Trunk Assessment: Other exceptions Cervical / Trunk Exceptions: s/p 10 days lumbar sx  Communication   Communication: No difficulties  Cognition Arousal/Alertness: Awake/alert Behavior During Therapy: WFL for tasks assessed/performed Overall Cognitive Status: Within Functional Limits for tasks assessed                                 General Comments: Follows one step commands with increased time. Requiring sequencing cues with multistep tasks.      General Comments General comments (skin integrity, edema, etc.): VSS on RA    Exercises     Assessment/Plan    PT Assessment Patient needs continued PT services  PT Problem List Decreased strength;Decreased mobility;Decreased balance;Decreased activity tolerance;Pain       PT Treatment Interventions DME instruction;Gait training;Stair training;Functional mobility training;Therapeutic activities;Therapeutic exercise;Balance  training;Neuromuscular re-education;Patient/family education    PT Goals (Current goals can be found in the Care Plan section)  Acute Rehab PT Goals Patient Stated Goal: To go home per pt even though she needs 24 hour care PT Goal Formulation: With patient Time For Goal Achievement: 12/12/20 Potential to Achieve Goals: Poor    Frequency Min 2X/week   Barriers to discharge Decreased caregiver support      Co-evaluation               AM-PAC PT "6 Clicks" Mobility  Outcome Measure Help needed turning from your back to your side while in a flat bed without using bedrails?: A Lot Help needed moving from lying on your back to sitting on the side of a flat bed without using bedrails?: A Lot Help needed moving to and from a bed to a chair (including a wheelchair)?: Total Help needed standing up from a chair using your arms (e.g., wheelchair or bedside chair)?: Total Help needed to walk in hospital room?: Total Help needed climbing 3-5 steps with a railing? : Total 6 Click Score: 8    End of Session Equipment Utilized During Treatment: Back brace (not in room) Activity Tolerance: Patient limited by lethargy;Patient limited by fatigue Patient left: with call bell/phone within reach;in bed;with bed alarm set Nurse Communication: Mobility status;Need for lift equipment;Patient requests pain  meds PT Visit Diagnosis: Muscle weakness (generalized) (M62.81);Unsteadiness on feet (R26.81);Other abnormalities of gait and mobility (R26.89);Repeated falls (R29.6);History of falling (Z91.81);Pain Pain - part of body:  (Back)    Time: [id number] PT Time Calculation (min) (ACUTE ONLY): 18 min   Charges:     PT Treatments $Therapeutic Activity: 8-22 mins        Makya Yurko M,PT Acute Rehab Services [phone] (pager)   Alvira Philips 11/28/2020, 1:53 PM

## 2020-11-28 NOTE — Progress Notes (Signed)
PROGRESS NOTE        PATIENT DETAILS Name: PAMALA HITSON Age: 86 y.o. Sex: female Date of Birth: April 01, 1934 Admit Date: 11/25/2020 Admitting Physician Edwin Dada, MD [id number], Fransico Him, MD  Brief Narrative: Patient is a 86 y.o. female who went decompressive two-level lumbar spine surgery on 9/7-presented from SNF with fever/confusion-she was subsequently found to have severe sepsis due to Proteus bacteremia.  Subjective: No shortness of breath-no chest pain-lying comfortably in bed.  Objective: Vitals: Blood pressure (!) 115/52, pulse 70, temperature 99 F (37.2 C), temperature source Oral, resp. rate 19, height '5\' 4"'$  (1.626 m), weight 93.2 kg, SpO2 100 %.   Exam: Gen Exam:Alert awake-not in any distress HEENT:atraumatic, normocephalic Chest: B/L clear to auscultation anteriorly CVS:S1S2 regular Abdomen:soft non tender, non distended Extremities:no edema Neurology: Non focal Skin: no rash   Pertinent Labs/Radiology: WBC: 13.5 Hb: 10.2 Creatinine: 1.73  K: 5.0  Blood culture: Proteus mirabilis (pansensitive) Urine Culture: Klebsiella pneumoniae  Assessment/Plan: Severe sepsis due to Proteus bacteremia/UTI: Sepsis physiology has resolved-has been switched over to Ancef.  Since urine and blood culture showed discordant organisms-concerned that bacteremia may not be coming from UTI.  Will need to discuss with neurosurgery to see if we need to image operative area.  ID following.    Acute metabolic encephalopathy: Due to sepsis-resolved.  CT head negative.  Completely awake and alert this morning.  Recent lumbar spine surgery: No obvious wound infection per neurosurgery-see above concerns.  Hyperkalemia: Resolved.  PAF: Rate controlled-continue metoprolol and Eliquis.  Sick sinus syndrome-s/p PPM implantation: Continue telemetry monitoring.  Chronic diastolic heart failure: Euvolemic on exam.  CKD stage IIIb: Creatinine close  to baseline  Normocytic anemia: Hemoglobin stable-follow periodically.  HTN: BP controlled-continue amlodipine and metoprolol.  DM-2 (A1c 7.09/15): CBGs on the higher side-increase Levemir to 12 units-continue SSI-reassess on 9/19.   Recent Labs    11/27/20 1649 11/27/20 2022 11/28/20 0744  GLUCAP 272* 259* 216*     Hypothyroidism: Continue Synthroid.  TSH is stable.  History of breast cancer: Continue Arimidex-stable for outpatient follow-up with her primary oncologist.  Obesity: Estimated body mass index is 35.27 kg/m as calculated from the following:   Height as of this encounter: '5\' 4"'$  (1.626 m).   Weight as of this encounter: 93.2 kg.    Procedures :None Consults:ID, neurosurgery DVT Prophylaxis :Eliquis Code Status:Full code  Family Communication:Granddaughter-Jennifer-[phone]-updated over the phone on 9/17  Time spent: 25-minutes-Greater than 50% of this time was spent in counseling, explanation of diagnosis, planning of further management, and coordination of care.  Diet: Diet Order             Diet Carb Modified Fluid consistency: Thin; Room service appropriate? Yes  Diet effective now                      Disposition Plan: Status is: Inpatient  Remains inpatient appropriate because:Inpatient level of care appropriate due to severity of illness  Dispo: The patient is from: SNF              Anticipated d/c is to: Home              Patient currently is not medically stable to d/c.   Difficult to place patient No    Barriers to Discharge: Resolving sepsis physiology-on IV antibiotics-awaiting final culture results.  Awaiting PT/OT eval to determine safe disposition.  Antimicrobial agents: Anti-infectives (From admission, onward)    Start     Dose/Rate Route Frequency Ordered Stop   11/28/20 1800  ceFAZolin (ANCEF) IVPB 2g/100 mL premix        2 g 200 mL/hr over 30 Minutes Intravenous Every 12 hours 11/28/20 0928     11/28/20 0600   vancomycin (VANCOCIN) IVPB 1000 mg/200 mL premix  Status:  Discontinued        1,000 mg 200 mL/hr over 60 Minutes Intravenous Every 48 hours 11/26/20 0617 11/26/20 0751   11/26/20 1800  cefTRIAXone (ROCEPHIN) 2 g in sodium chloride 0.9 % 100 mL IVPB  Status:  Discontinued        2 g 200 mL/hr over 30 Minutes Intravenous Every 24 hours 11/26/20 0753 11/28/20 0903   11/26/20 0600  vancomycin (VANCOREADY) IVPB 1500 mg/300 mL  Status:  Discontinued        1,500 mg 150 mL/hr over 120 Minutes Intravenous STAT 11/26/20 0556 11/26/20 0858   11/26/20 0500  meropenem (MERREM) 1 g in sodium chloride 0.9 % 100 mL IVPB  Status:  Discontinued        1 g 200 mL/hr over 30 Minutes Intravenous Every 12 hours 11/25/20 1734 11/26/20 0751   11/26/20 0045  doxycycline (VIBRA-TABS) tablet 100 mg  Status:  Discontinued        100 mg Oral Every 12 hours 11/26/20 0040 11/26/20 1107   11/25/20 1715  meropenem (MERREM) 1 g in sodium chloride 0.9 % 100 mL IVPB        1 g 200 mL/hr over 30 Minutes Intravenous  Once 11/25/20 1710 11/25/20 1917   11/25/20 1400  cefTRIAXone (ROCEPHIN) 1 g in sodium chloride 0.9 % 100 mL IVPB        1 g 200 mL/hr over 30 Minutes Intravenous  Once 11/25/20 1347 11/25/20 1549        MEDICATIONS: Scheduled Meds:  amLODipine  5 mg Oral Daily   anastrozole  1 mg Oral Daily   apixaban  2.5 mg Oral BID   docusate sodium  100 mg Oral BID   ferrous sulfate  325 mg Oral Q breakfast   insulin aspart  0-15 Units Subcutaneous TID WC   insulin aspart  0-5 Units Subcutaneous QHS   insulin detemir  5 Units Subcutaneous QHS   levothyroxine  137 mcg Oral QAC breakfast   metoprolol succinate  25 mg Oral Daily   multivitamin with minerals  1 tablet Oral Daily   senna  2 tablet Oral QHS   sodium chloride flush  3 mL Intravenous Q12H   timolol  1 drop Both Eyes q morning   Continuous Infusions:  sodium chloride      ceFAZolin (ANCEF) IV     PRN Meds:.sodium chloride, acetaminophen **OR**  acetaminophen, alum & mag hydroxide-simeth, oxyCODONE, polyethylene glycol, sodium chloride flush   I have personally reviewed following labs and imaging studies  LABORATORY DATA: CBC: Recent Labs  Lab 11/25/20 1154 11/26/20 0257 11/26/20 0626 11/27/20 0041 11/28/20 0100  WBC 22.6*  --  18.0* 13.4* 13.5*  NEUTROABS  --   --  14.8*  --   --   HGB 11.5* 11.2* 10.2* 10.0* 10.2*  HCT 36.1 33.0* 32.9* 30.2* 31.6*  MCV 101.1*  --  102.5* 96.5 97.2  PLT 364  --  343 310 346     Basic Metabolic Panel: Recent Labs  Lab 11/25/20 1154 11/26/20 0257 11/26/20  [id number] 11/27/20 0041 11/28/20 0100  NA 134* 137 137 135 133*  K 5.1 4.9 5.3* 5.2* 5.0  CL 102  --  107 103 101  CO2 20*  --  18* 20* 21*  GLUCOSE 275*  --  229* 206* 205*  BUN 33*  --  35* 35* 37*  CREATININE 1.69*  --  1.75* 1.68* 1.73*  CALCIUM 8.9  --  8.5* 8.4* 8.4*     GFR: Estimated Creatinine Clearance: 26.8 mL/min (A) (by C-G formula based on SCr of 1.73 mg/dL (H)).  Liver Function Tests: Recent Labs  Lab 11/25/20 1154 11/26/20 0626  AST 29 53*  ALT 52* 54*  ALKPHOS 94 123  BILITOT 1.1 1.1  PROT 6.6 5.9*  ALBUMIN 2.9* 2.5*    No results for input(s): LIPASE, AMYLASE in the last 168 hours. Recent Labs  Lab 11/25/20 2029  AMMONIA 17     Coagulation Profile: No results for input(s): INR, PROTIME in the last 168 hours.  Cardiac Enzymes: No results for input(s): CKTOTAL, CKMB, CKMBINDEX, TROPONINI in the last 168 hours.  BNP (last 3 results) No results for input(s): PROBNP in the last 8760 hours.  Lipid Profile: No results for input(s): CHOL, HDL, LDLCALC, TRIG, CHOLHDL, LDLDIRECT in the last 72 hours.  Thyroid Function Tests: Recent Labs    11/25/20 2023  TSH 0.950     Anemia Panel: Recent Labs    11/25/20 2029  VITAMINB12 1,020*     Urine analysis:    Component Value Date/Time   COLORURINE YELLOW 11/25/2020 1304   APPEARANCEUR HAZY (A) 11/25/2020 1304   LABSPEC 1.016  11/25/2020 1304   PHURINE 5.0 11/25/2020 1304   GLUCOSEU NEGATIVE 11/25/2020 1304   HGBUR NEGATIVE 11/25/2020 1304   BILIRUBINUR NEGATIVE 11/25/2020 1304   KETONESUR NEGATIVE 11/25/2020 1304   PROTEINUR 30 (A) 11/25/2020 1304   NITRITE NEGATIVE 11/25/2020 1304   LEUKOCYTESUR MODERATE (A) 11/25/2020 1304    Sepsis Labs: Lactic Acid, Venous    Component Value Date/Time   LATICACIDVEN 1.5 11/26/2020 0205    MICROBIOLOGY: Recent Results (from the past 240 hour(s))  SARS CORONAVIRUS 2 (TAT 6-24 HRS) Nasopharyngeal Nasopharyngeal Swab     Status: None   Collection Time: 11/21/20 11:49 AM   Specimen: Nasopharyngeal Swab  Result Value Ref Range Status   SARS Coronavirus 2 NEGATIVE NEGATIVE Final    Comment: (NOTE) SARS-CoV-2 target nucleic acids are NOT DETECTED.  The SARS-CoV-2 RNA is generally detectable in upper and lower respiratory specimens during the acute phase of infection. Negative results do not preclude SARS-CoV-2 infection, do not rule out co-infections with other pathogens, and should not be used as the sole basis for treatment or other patient management decisions. Negative results must be combined with clinical observations, patient history, and epidemiological information. The expected result is Negative.  Fact Sheet for Patients: SugarRoll.be  Fact Sheet for Healthcare Providers: https://www.woods-mathews.com/  This test is not yet approved or cleared by the [place] FDA and  has been authorized for detection and/or diagnosis of SARS-CoV-2 by FDA under an Emergency Use Authorization (EUA). This EUA will remain  in effect (meaning this test can be used) for the duration of the COVID-19 declaration under Se ction 564(b)(1) of the Act, 21 U.S.C. section 360bbb-3(b)(1), unless the authorization is terminated or revoked sooner.  Performed at [facility], [place] [address], [place], [place] [address]   Resp  Panel by RT-PCR (Flu A&B, Covid) Nasopharyngeal Swab     Status:  None   Collection Time: 11/25/20 11:22 AM   Specimen: Nasopharyngeal Swab; Nasopharyngeal(NP) swabs in vial transport medium  Result Value Ref Range Status   SARS Coronavirus 2 by RT PCR NEGATIVE NEGATIVE Final    Comment: (NOTE) SARS-CoV-2 target nucleic acids are NOT DETECTED.  The SARS-CoV-2 RNA is generally detectable in upper respiratory specimens during the acute phase of infection. The lowest concentration of SARS-CoV-2 viral copies this assay can detect is 138 copies/mL. A negative result does not preclude SARS-Cov-2 infection and should not be used as the sole basis for treatment or other patient management decisions. A negative result may occur with  improper specimen collection/handling, submission of specimen other than nasopharyngeal swab, presence of viral mutation(s) within the areas targeted by this assay, and inadequate number of viral copies(<138 copies/mL). A negative result must be combined with clinical observations, patient history, and epidemiological information. The expected result is Negative.  Fact Sheet for Patients:  EntrepreneurPulse.com.au  Fact Sheet for Healthcare Providers:  IncredibleEmployment.be  This test is no t yet approved or cleared by the [place] FDA and  has been authorized for detection and/or diagnosis of SARS-CoV-2 by FDA under an Emergency Use Authorization (EUA). This EUA will remain  in effect (meaning this test can be used) for the duration of the COVID-19 declaration under Section 564(b)(1) of the Act, 21 U.S.C.section 360bbb-3(b)(1), unless the authorization is terminated  or revoked sooner.       Influenza A by PCR NEGATIVE NEGATIVE Final   Influenza B by PCR NEGATIVE NEGATIVE Final    Comment: (NOTE) The Xpert Xpress SARS-CoV-2/FLU/RSV plus assay is intended as an aid in the diagnosis of influenza from Nasopharyngeal  swab specimens and should not be used as a sole basis for treatment. Nasal washings and aspirates are unacceptable for Xpert Xpress SARS-CoV-2/FLU/RSV testing.  Fact Sheet for Patients: EntrepreneurPulse.com.au  Fact Sheet for Healthcare Providers: IncredibleEmployment.be  This test is not yet approved or cleared by the [place] FDA and has been authorized for detection and/or diagnosis of SARS-CoV-2 by FDA under an Emergency Use Authorization (EUA). This EUA will remain in effect (meaning this test can be used) for the duration of the COVID-19 declaration under Section 564(b)(1) of the Act, 21 U.S.C. section 360bbb-3(b)(1), unless the authorization is terminated or revoked.  Performed at [facility], [place] [address], [place], [place] [address]   Blood culture (routine x 2)     Status: Abnormal   Collection Time: 11/25/20 11:54 AM   Specimen: BLOOD RIGHT ARM  Result Value Ref Range Status   Specimen Description BLOOD RIGHT ARM  Final   Special Requests   Final    BOTTLES DRAWN AEROBIC AND ANAEROBIC Blood Culture adequate volume   Culture  Setup Time   Final    GRAM NEGATIVE RODS IN BOTH AEROBIC AND ANAEROBIC BOTTLES CRITICAL RESULT CALLED TO, READ BACK BY AND VERIFIED WITH: C AMEND,PHARMD'@0647'$  11/26/20 [place] CORRECTED RESULTS PREVIOUSLY REPORTED AS: GRAM POSITIVE RODS IN BOTH AEROBIC AND ANAEROBIC BOTTLES CORRECTED RESULTS CALLED TO: V[place] ON [id number] AT 0900 BY E.PARRISH Performed at [facility], [place] [address], [place], [place] [address]    Culture PROTEUS MIRABILIS (A)  Final   Report Status 11/27/2020 FINAL  Final   Organism ID, Bacteria PROTEUS MIRABILIS  Final      Susceptibility   Proteus mirabilis - MIC*    AMPICILLIN <=2 SENSITIVE Sensitive     CEFAZOLIN <=4 SENSITIVE Sensitive  CEFEPIME <=0.12 SENSITIVE Sensitive     CEFTAZIDIME <=1 SENSITIVE Sensitive     CEFTRIAXONE <=0.25 SENSITIVE Sensitive      CIPROFLOXACIN <=0.25 SENSITIVE Sensitive     GENTAMICIN <=1 SENSITIVE Sensitive     IMIPENEM 2 SENSITIVE Sensitive     TRIMETH/SULFA <=20 SENSITIVE Sensitive     AMPICILLIN/SULBACTAM <=2 SENSITIVE Sensitive     PIP/TAZO <=4 SENSITIVE Sensitive     * PROTEUS MIRABILIS  Blood Culture ID Panel (Reflexed)     Status: Abnormal   Collection Time: 11/25/20 11:54 AM  Result Value Ref Range Status   Enterococcus faecalis NOT DETECTED NOT DETECTED Final   Enterococcus Faecium NOT DETECTED NOT DETECTED Final   Listeria monocytogenes NOT DETECTED NOT DETECTED Final   Staphylococcus species NOT DETECTED NOT DETECTED Final   Staphylococcus aureus (BCID) NOT DETECTED NOT DETECTED Final   Staphylococcus epidermidis NOT DETECTED NOT DETECTED Final   Staphylococcus lugdunensis NOT DETECTED NOT DETECTED Final   Streptococcus species NOT DETECTED NOT DETECTED Final   Streptococcus agalactiae NOT DETECTED NOT DETECTED Final   Streptococcus pneumoniae NOT DETECTED NOT DETECTED Final   Streptococcus pyogenes NOT DETECTED NOT DETECTED Final   A.calcoaceticus-baumannii NOT DETECTED NOT DETECTED Final   Bacteroides fragilis NOT DETECTED NOT DETECTED Final   Enterobacterales DETECTED (A) NOT DETECTED Final    Comment: Enterobacterales represent a large order of gram negative bacteria, not a single organism. CRITICAL RESULT CALLED TO, READ BACK BY AND VERIFIED WITH: C AMEND,PHARMD'@0649'$  11/26/20 [place]    Enterobacter cloacae complex NOT DETECTED NOT DETECTED Final   Escherichia coli NOT DETECTED NOT DETECTED Final   Klebsiella aerogenes NOT DETECTED NOT DETECTED Final   Klebsiella oxytoca NOT DETECTED NOT DETECTED Final   Klebsiella pneumoniae NOT DETECTED NOT DETECTED Final   Proteus species DETECTED (A) NOT DETECTED Final    Comment: CRITICAL RESULT CALLED TO, READ BACK BY AND VERIFIED WITH: C AMNED,PHARMD'@0649'$  11/26/20 [place]    Salmonella species NOT DETECTED NOT DETECTED Final   Serratia marcescens NOT  DETECTED NOT DETECTED Final   Haemophilus influenzae NOT DETECTED NOT DETECTED Final   Neisseria meningitidis NOT DETECTED NOT DETECTED Final   Pseudomonas aeruginosa NOT DETECTED NOT DETECTED Final   Stenotrophomonas maltophilia NOT DETECTED NOT DETECTED Final   Candida albicans NOT DETECTED NOT DETECTED Final   Candida auris NOT DETECTED NOT DETECTED Final   Candida glabrata NOT DETECTED NOT DETECTED Final   Candida krusei NOT DETECTED NOT DETECTED Final   Candida parapsilosis NOT DETECTED NOT DETECTED Final   Candida tropicalis NOT DETECTED NOT DETECTED Final   Cryptococcus neoformans/gattii NOT DETECTED NOT DETECTED Final   CTX-M ESBL NOT DETECTED NOT DETECTED Final   Carbapenem resistance IMP NOT DETECTED NOT DETECTED Final   Carbapenem resistance KPC NOT DETECTED NOT DETECTED Final   Carbapenem resistance NDM NOT DETECTED NOT DETECTED Final   Carbapenem resist OXA 48 LIKE NOT DETECTED NOT DETECTED Final   Carbapenem resistance VIM NOT DETECTED NOT DETECTED Final    Comment: Performed at [facility], [address], [place], [place] [address]  Blood culture (routine x 2)     Status: Abnormal   Collection Time: 11/25/20  2:39 PM   Specimen: BLOOD  Result Value Ref Range Status   Specimen Description BLOOD SITE NOT SPECIFIED  Final   Special Requests BOTTLES DRAWN AEROBIC ONLY  Final   Culture  Setup Time   Final    GRAM NEGATIVE RODS AEROBIC BOTTLE ONLY CRITICAL VALUE NOTED.  VALUE IS CONSISTENT WITH PREVIOUSLY REPORTED AND CALLED VALUE.    Culture (A)  Final    PROTEUS MIRABILIS SUSCEPTIBILITIES PERFORMED ON PREVIOUS CULTURE WITHIN THE LAST 5 DAYS. Performed at [facility], [place] [address], [place], [place] [address]    Report Status 11/27/2020 FINAL  Final  Urine Culture     Status: Abnormal   Collection Time: 11/25/20  3:31 PM   Specimen: Urine, Clean Catch  Result Value Ref Range Status   Specimen Description URINE, CLEAN CATCH  Final   Special Requests    Final    NONE Performed at [facility], [place] [address], [place], [place] [address]    Culture >=100,000 COLONIES/mL KLEBSIELLA PNEUMONIAE (A)  Final   Report Status 11/28/2020 FINAL  Final   Organism ID, Bacteria KLEBSIELLA PNEUMONIAE (A)  Final      Susceptibility   Klebsiella pneumoniae - MIC*    AMPICILLIN >=32 RESISTANT Resistant     CEFAZOLIN <=4 SENSITIVE Sensitive     CEFEPIME <=0.12 SENSITIVE Sensitive     CEFTRIAXONE <=0.25 SENSITIVE Sensitive     CIPROFLOXACIN <=0.25 SENSITIVE Sensitive     GENTAMICIN <=1 SENSITIVE Sensitive     IMIPENEM <=0.25 SENSITIVE Sensitive     NITROFURANTOIN 128 RESISTANT Resistant     TRIMETH/SULFA <=20 SENSITIVE Sensitive     AMPICILLIN/SULBACTAM >=32 RESISTANT Resistant     PIP/TAZO 16 SENSITIVE Sensitive     * >=100,000 COLONIES/mL KLEBSIELLA PNEUMONIAE    RADIOLOGY STUDIES/RESULTS: No results found.   LOS: 2 days   Oren Binet, MD  [facility]    To contact the attending provider between 7A-7P or the covering provider during after hours 7P-7A, please log into the web site [email or web address] and access using universal [place] password for that web site. If you do not have the password, please call the hospital operator.  11/28/2020, 10:38 AM

## 2020-11-28 NOTE — Evaluation (Signed)
Occupational Therapy Evaluation Patient Details Name: Summer Hawkins MRN: [id number] DOB: 1934-05-08 Today's Date: 11/28/2020   History of Present Illness 86 y.o. F admitted on 9/15 due to increased confusion and weakness. Pt with PMH significant for A fib,L3-4, L4-5 fusion on 11/17/20, breast ca, CKD stage III, DM, HTN, pacemaker, lumpectomy, RC repair.   Clinical Impression   Pt admitted for concerns listed above. PTA to admission pt was at a SNF following lumbar fusion 10 days ago. She was requiring mod-max A for all ADL's and functional mobility. At this time, pt presents with increased pain with all movement, unable to fully stand, and requiring max A-total A +2 for all LB ADL's and functional mobility. Additionally, pt requiring simple commands or increased sequencing cues to complete multistep tasks. Recommend pt return to SNF for continued therapies. OT will follow acutely.       Recommendations for follow up therapy are one component of a multi-disciplinary discharge planning process, led by the attending physician.  Recommendations may be updated based on patient status, additional functional criteria and insurance authorization.   Follow Up Recommendations  SNF;Supervision/Assistance - 24 hour    Equipment Recommendations  None recommended by OT    Recommendations for Other Services       Precautions / Restrictions Precautions Precautions: Fall;Back Precaution Booklet Issued: No Precaution Comments: Pt had Lumbar fusion 10 days ago. Precautions reviewed, back brace not in room, PA Nundkumar reported that she was ok to move around in the room with no brace. Restrictions Weight Bearing Restrictions: No      Mobility Bed Mobility Overal bed mobility: Needs Assistance Bed Mobility: Rolling;Sidelying to Sit;Sit to Supine Rolling: Max assist Sidelying to sit: Max assist   Sit to supine: Max assist;+2 for safety/equipment;+2 for physical assistance   General bed mobility  comments: Pt requiring max A to come to sitting, limited by pain, unable to return to supine without max A +2 for helicopter transition back to supine.    Transfers Overall transfer level: Needs assistance Equipment used: Rolling walker (2 wheeled) Transfers: Sit to/from Stand Sit to Stand: Total assist;From elevated surface         General transfer comment: Able to lift her bottom ~3 inches off the bed before having to sit back down, attempted x5    Balance Overall balance assessment: Needs assistance Sitting-balance support: Bilateral upper extremity supported;Feet supported Sitting balance-Leahy Scale: Poor Sitting balance - Comments: Pt requiring cues to lean forward                                   ADL either performed or assessed with clinical judgement   ADL Overall ADL's : Needs assistance/impaired Eating/Feeding: Set up;Sitting   Grooming: Supervision/safety;Set up;Sitting   Upper Body Bathing: Minimal assistance;Sitting   Lower Body Bathing: Total assistance;+2 for physical assistance;+2 for safety/equipment;Bed level   Upper Body Dressing : Minimal assistance;Sitting   Lower Body Dressing: Total assistance;+2 for safety/equipment;Bed level   Toilet Transfer: Total assistance;+2 for physical assistance;+2 for safety/equipment;Squat-pivot   Toileting- Clothing Manipulation and Hygiene: Total assistance;+2 for physical assistance;+2 for safety/equipment;Bed level         General ADL Comments: Pt requiring max-total A +2 for all LB ADL's and functional mobility. Pt limtied by pain and increased weakness.     Vision Baseline Vision/History: 1 Wears glasses Ability to See in Adequate Light: 1 Impaired Patient Visual Report: No change from  baseline Vision Assessment?: No apparent visual deficits     Perception Perception Perception Tested?: No   Praxis Praxis Praxis tested?: Not tested    Pertinent Vitals/Pain Pain Assessment: 0-10 Pain  Score: 8  Pain Location: back Pain Descriptors / Indicators: Aching;Discomfort;Grimacing;Moaning Pain Intervention(s): Monitored during session;Repositioned;Limited activity within patient's tolerance     Hand Dominance Right   Extremity/Trunk Assessment Upper Extremity Assessment Upper Extremity Assessment: Generalized weakness   Lower Extremity Assessment Lower Extremity Assessment: Defer to PT evaluation   Cervical / Trunk Assessment Cervical / Trunk Assessment: Other exceptions Cervical / Trunk Exceptions: s/p 10 days lumbar sx   Communication Communication Communication: No difficulties   Cognition Arousal/Alertness: Awake/alert Behavior During Therapy: WFL for tasks assessed/performed Overall Cognitive Status: Within Functional Limits for tasks assessed                                 General Comments: Follows one step commands with increased time. Loquacious and continued conversation throughout activity. Requiring sequencing cues with multistep tasks.   General Comments  VSS on RA    Exercises     Shoulder Instructions      Home Living Family/patient expects to be discharged to:: Skilled nursing facility                                 Additional Comments: Pt reports that she wants to go home and not go back to the SNF she was at, however pt will not have caregivers available 24/7 to provide +2 max assist.      Prior Functioning/Environment Level of Independence: Needs assistance  Gait / Transfers Assistance Needed: At SNF, requires assist with all transfers, only able to tolerate a few steps of ambulation before needing to sit. ADL's / Homemaking Assistance Needed: Gets mod-max A +2 assist with all ADL's   Comments: Prior to back surgery, pt was very limited by pain, but was living alone and able to take care of herself.        OT Problem List: Decreased strength;Decreased range of motion;Decreased activity tolerance;Impaired  balance (sitting and/or standing);Decreased knowledge of precautions;Pain      OT Treatment/Interventions: Self-care/ADL training;Therapeutic exercise;Energy conservation;DME and/or AE instruction;Therapeutic activities;Patient/family education    OT Goals(Current goals can be found in the care plan section) Acute Rehab OT Goals Patient Stated Goal: To go home OT Goal Formulation: With patient Time For Goal Achievement: 12/12/20 Potential to Achieve Goals: Fair ADL Goals Pt Will Perform Grooming: with modified independence;sitting Pt Will Perform Lower Body Bathing: with min assist;with adaptive equipment;sitting/lateral leans;sit to/from stand Pt Will Perform Lower Body Dressing: with mod assist;with adaptive equipment;sitting/lateral leans;sit to/from stand Pt Will Transfer to Toilet: with mod assist;stand pivot transfer Pt Will Perform Toileting - Clothing Manipulation and hygiene: with mod assist;with adaptive equipment;sitting/lateral leans;sit to/from stand  OT Frequency: Min 2X/week   Barriers to D/C:            Co-evaluation              AM-PAC OT "6 Clicks" Daily Activity     Outcome Measure Help from another person eating meals?: A Little Help from another person taking care of personal grooming?: A Little Help from another person toileting, which includes using toliet, bedpan, or urinal?: Total Help from another person bathing (including washing, rinsing, drying)?: Total Help from another person to put  on and taking off regular upper body clothing?: A Little Help from another person to put on and taking off regular lower body clothing?: Total 6 Click Score: 12   End of Session Equipment Utilized During Treatment: Gait belt;Rolling walker Nurse Communication: Mobility status  Activity Tolerance: Patient tolerated treatment well Patient left: in bed;with call bell/phone within reach;with bed alarm set  OT Visit Diagnosis: Unsteadiness on feet (R26.81);Other  abnormalities of gait and mobility (R26.89);Muscle weakness (generalized) (M62.81);Pain Pain - part of body:  (back)                Time: [id number] OT Time Calculation (min): 35 min Charges:  OT General Charges $OT Visit: 1 Visit OT Evaluation $OT Eval Moderate Complexity: 1 Mod OT Treatments $Therapeutic Activity: 8-22 mins  Summer Gelinas H., OTR/L Acute Rehabilitation  Summer Hawkins 11/28/2020, 1:10 PM

## 2020-11-28 NOTE — Plan of Care (Signed)

## 2020-11-28 NOTE — Progress Notes (Signed)
  NEUROSURGERY PROGRESS NOTE   No issues overnight. Pt cont to c/o back soreness.   EXAM:  BP (!) 115/52 (BP Location: Right Arm)   Pulse 70   Temp 99 F (37.2 C) (Oral)   Resp 19   Ht '5\' 4"'$  (1.626 m)   Wt 93.2 kg   LMP  (LMP Unknown)   SpO2 100%   BMI 35.27 kg/m   Awake, alert, oriented  Speech fluent, appropriate  CN grossly intact  MAE well in bed Wound minimal drainage, appears to have small superficial dehiscence on superior aspect of wound, mild erythema  IMPRESSION:  86 y.o. female with bacteremia, urosepsis with different causative organisms. Her wound looks ok so would not think operative exploration/washout is necessary at this point. She appears to be responding to abx. Could image her lumbar spine if there is concern that she is failing abx.  PLAN: - Cont to monitor wound - Cont abx per ID   Consuella Lose, MD [facility]

## 2020-11-28 NOTE — Progress Notes (Signed)
Brief ID note:  Patient remains afebrile over the last 24 hours, T-max 99 F.  Kidney function remained stable and leukocytosis stable.  Blood cultures from admission isolated pansensitive Proteus mirabilis and urine cultures were discordant with 100,000 colonies per mL of Klebsiella pneumoniae.  She overall seems to be responding to IV antibiotics and neurosurgery has been following due to her recent lumbar surgery.  Her surgical wound does have some mild drainage but no gross infection at this point.  Based on susceptibilities will narrow from ceftriaxone to cefazolin which will provide coverage for both the Proteus in the bloodstream and Klebsiella in the urine.  Continue to monitor her wound closely.  Will continue to follow.   Raynelle Highland for Infectious [facility] 11/28/2020, 9:01 AM

## 2020-11-29 ENCOUNTER — Encounter: Payer: Self-pay | Admitting: *Deleted

## 2020-11-29 DIAGNOSIS — R4182 Altered mental status, unspecified: Secondary | ICD-10-CM | POA: Diagnosis not present

## 2020-11-29 DIAGNOSIS — N390 Urinary tract infection, site not specified: Secondary | ICD-10-CM | POA: Diagnosis not present

## 2020-11-29 DIAGNOSIS — M549 Dorsalgia, unspecified: Secondary | ICD-10-CM | POA: Diagnosis not present

## 2020-11-29 DIAGNOSIS — N183 Chronic kidney disease, stage 3 unspecified: Secondary | ICD-10-CM | POA: Diagnosis not present

## 2020-11-29 DIAGNOSIS — R7881 Bacteremia: Secondary | ICD-10-CM | POA: Diagnosis not present

## 2020-11-29 DIAGNOSIS — C50412 Malignant neoplasm of upper-outer quadrant of left female breast: Secondary | ICD-10-CM | POA: Diagnosis not present

## 2020-11-29 LAB — GLUCOSE, CAPILLARY
Glucose-Capillary: 178 mg/dL — ABNORMAL HIGH (ref 70–99)
Glucose-Capillary: 201 mg/dL — ABNORMAL HIGH (ref 70–99)
Glucose-Capillary: 231 mg/dL — ABNORMAL HIGH (ref 70–99)
Glucose-Capillary: 255 mg/dL — ABNORMAL HIGH (ref 70–99)
Glucose-Capillary: 266 mg/dL — ABNORMAL HIGH (ref 70–99)

## 2020-11-29 NOTE — Progress Notes (Signed)
PROGRESS NOTE        PATIENT DETAILS Name: Summer Hawkins Age: 86 y.o. Sex: female Date of Birth: 1934/11/10 Admit Date: 11/25/2020 Admitting Physician Edwin Dada, MD [id number], Fransico Him, MD  Brief Narrative: Patient is a 86 y.o. female who went decompressive two-level lumbar spine surgery on 9/7-presented from SNF with fever/confusion-she was subsequently found to have severe sepsis due to Proteus bacteremia.  Subjective: Lying comfortably in bed.  Inquiring about discharge plans.  No chest pain or shortness of breath.  Objective: Vitals: Blood pressure (!) 146/81, pulse 84, temperature 98.1 F (36.7 C), temperature source Oral, resp. rate 16, height '5\' 4"'$  (1.626 m), weight 95.4 kg, SpO2 97 %.   Exam: Gen Exam:Alert awake-not in any distress HEENT:atraumatic, normocephalic Chest: B/L clear to auscultation anteriorly CVS:S1S2 regular Abdomen:soft non tender, non distended Extremities:no edema Neurology: Non focal Skin: no rash   Pertinent Labs/Radiology: Blood culture: Proteus mirabilis (pansensitive) Urine Culture: Klebsiella pneumoniae  Assessment/Plan: Severe sepsis due to Proteus bacteremia/Klebsiella UTI: Sepsis physiology has resolved.  Due to discordant organisms and blood in urine-some concern that bacteremia could have originated from her back wound.  However per neurosurgery-no evidence of any back wound infection.  Plans are to continue IV antibiotics for another few days and assess if she has any fever or worsening leukocytosis.  ID/neurosurgery following.    Acute metabolic encephalopathy: Due to sepsis-resolved.  CT head negative.  She is awake and alert this morning-however has had some issues with mild delirium (on oxycodone for pain)   Recent lumbar spine surgery: No obvious wound infection per neurosurgery-see above concerns.  Hyperkalemia: Resolved.  PAF: Rate controlled-continue metoprolol and Eliquis.  Sick  sinus syndrome-s/p PPM implantation: Continue telemetry monitoring.  Chronic diastolic heart failure: Euvolemic on exam.  CKD stage IIIb: Creatinine close to baseline  Normocytic anemia: Hemoglobin stable-follow periodically.  HTN: BP controlled-continue amlodipine and metoprolol.  DM-2 (A1c 7.09/15): CBGs stable-continue Levemir 12 units and SSI.  Reassess on 9/20.  Recent Labs    11/28/20 2128 11/29/20 0814 11/29/20 0816  GLUCAP 248* 201* 178*     Hypothyroidism: Continue Synthroid.  TSH is stable.  History of breast cancer: Continue Arimidex-stable for outpatient follow-up with her primary oncologist.  Obesity: Estimated body mass index is 36.1 kg/m as calculated from the following:   Height as of this encounter: '5\' 4"'$  (1.626 m).   Weight as of this encounter: 95.4 kg.    Procedures :None Consults:ID, neurosurgery DVT Prophylaxis :Eliquis Code Status:Full code  Family Communication:Granddaughter-Jennifer-[phone]-updated over the phone on 9/19  Time spent: 25-minutes-Greater than 50% of this time was spent in counseling, explanation of diagnosis, planning of further management, and coordination of care.  Diet: Diet Order             Diet Carb Modified Fluid consistency: Thin; Room service appropriate? Yes  Diet effective now                      Disposition Plan: Status is: Inpatient  Remains inpatient appropriate because:Inpatient level of care appropriate due to severity of illness  Dispo: The patient is from: SNF              Anticipated d/c is to: Home              Patient currently is not medically stable to  d/c.   Difficult to place patient No    Barriers to Discharge: Resolving sepsis physiology-on IV antibiotics-awaiting final culture results.  Awaiting PT/OT eval to determine safe disposition.  Antimicrobial agents: Anti-infectives (From admission, onward)    Start     Dose/Rate Route Frequency Ordered Stop   11/28/20 1800   ceFAZolin (ANCEF) IVPB 2g/100 mL premix        2 g 200 mL/hr over 30 Minutes Intravenous Every 12 hours 11/28/20 0928     11/28/20 0600  vancomycin (VANCOCIN) IVPB 1000 mg/200 mL premix  Status:  Discontinued        1,000 mg 200 mL/hr over 60 Minutes Intravenous Every 48 hours 11/26/20 0617 11/26/20 0751   11/26/20 1800  cefTRIAXone (ROCEPHIN) 2 g in sodium chloride 0.9 % 100 mL IVPB  Status:  Discontinued        2 g 200 mL/hr over 30 Minutes Intravenous Every 24 hours 11/26/20 0753 11/28/20 0903   11/26/20 0600  vancomycin (VANCOREADY) IVPB 1500 mg/300 mL  Status:  Discontinued        1,500 mg 150 mL/hr over 120 Minutes Intravenous STAT 11/26/20 0556 11/26/20 0858   11/26/20 0500  meropenem (MERREM) 1 g in sodium chloride 0.9 % 100 mL IVPB  Status:  Discontinued        1 g 200 mL/hr over 30 Minutes Intravenous Every 12 hours 11/25/20 1734 11/26/20 0751   11/26/20 0045  doxycycline (VIBRA-TABS) tablet 100 mg  Status:  Discontinued        100 mg Oral Every 12 hours 11/26/20 0040 11/26/20 1107   11/25/20 1715  meropenem (MERREM) 1 g in sodium chloride 0.9 % 100 mL IVPB        1 g 200 mL/hr over 30 Minutes Intravenous  Once 11/25/20 1710 11/25/20 1917   11/25/20 1400  cefTRIAXone (ROCEPHIN) 1 g in sodium chloride 0.9 % 100 mL IVPB        1 g 200 mL/hr over 30 Minutes Intravenous  Once 11/25/20 1347 11/25/20 1549        MEDICATIONS: Scheduled Meds:  amLODipine  5 mg Oral Daily   anastrozole  1 mg Oral Daily   apixaban  2.5 mg Oral BID   benzonatate  200 mg Oral TID   docusate sodium  100 mg Oral BID   ferrous sulfate  325 mg Oral Q breakfast   insulin aspart  0-15 Units Subcutaneous TID WC   insulin aspart  0-5 Units Subcutaneous QHS   insulin detemir  12 Units Subcutaneous QHS   levothyroxine  137 mcg Oral QAC breakfast   metoprolol succinate  25 mg Oral Daily   multivitamin with minerals  1 tablet Oral Daily   senna  2 tablet Oral QHS   sodium chloride flush  3 mL Intravenous  Q12H   timolol  1 drop Both Eyes q morning   Continuous Infusions:  sodium chloride      ceFAZolin (ANCEF) IV 2 g (11/29/20 0533)   PRN Meds:.sodium chloride, acetaminophen **OR** acetaminophen, alum & mag hydroxide-simeth, oxyCODONE, polyethylene glycol, sodium chloride flush   I have personally reviewed following labs and imaging studies  LABORATORY DATA: CBC: Recent Labs  Lab 11/25/20 1154 11/26/20 0257 11/26/20 0626 11/27/20 0041 11/28/20 0100  WBC 22.6*  --  18.0* 13.4* 13.5*  NEUTROABS  --   --  14.8*  --   --   HGB 11.5* 11.2* 10.2* 10.0* 10.2*  HCT 36.1 33.0* 32.9* 30.2* 31.6*  MCV 101.1*  --  102.5* 96.5 97.2  PLT 364  --  343 310 346     Basic Metabolic Panel: Recent Labs  Lab 11/25/20 1154 11/26/20 0257 11/26/20 0626 11/27/20 0041 11/28/20 0100  NA 134* 137 137 135 133*  K 5.1 4.9 5.3* 5.2* 5.0  CL 102  --  107 103 101  CO2 20*  --  18* 20* 21*  GLUCOSE 275*  --  229* 206* 205*  BUN 33*  --  35* 35* 37*  CREATININE 1.69*  --  1.75* 1.68* 1.73*  CALCIUM 8.9  --  8.5* 8.4* 8.4*     GFR: Estimated Creatinine Clearance: 27.1 mL/min (A) (by C-G formula based on SCr of 1.73 mg/dL (H)).  Liver Function Tests: Recent Labs  Lab 11/25/20 1154 11/26/20 0626  AST 29 53*  ALT 52* 54*  ALKPHOS 94 123  BILITOT 1.1 1.1  PROT 6.6 5.9*  ALBUMIN 2.9* 2.5*    No results for input(s): LIPASE, AMYLASE in the last 168 hours. Recent Labs  Lab 11/25/20 2029  AMMONIA 17     Coagulation Profile: No results for input(s): INR, PROTIME in the last 168 hours.  Cardiac Enzymes: No results for input(s): CKTOTAL, CKMB, CKMBINDEX, TROPONINI in the last 168 hours.  BNP (last 3 results) No results for input(s): PROBNP in the last 8760 hours.  Lipid Profile: No results for input(s): CHOL, HDL, LDLCALC, TRIG, CHOLHDL, LDLDIRECT in the last 72 hours.  Thyroid Function Tests: No results for input(s): TSH, T4TOTAL, FREET4, T3FREE, THYROIDAB in the last 72  hours.   Anemia Panel: No results for input(s): VITAMINB12, FOLATE, FERRITIN, TIBC, IRON, RETICCTPCT in the last 72 hours.   Urine analysis:    Component Value Date/Time   COLORURINE YELLOW 11/25/2020 1304   APPEARANCEUR HAZY (A) 11/25/2020 1304   LABSPEC 1.016 11/25/2020 1304   PHURINE 5.0 11/25/2020 1304   GLUCOSEU NEGATIVE 11/25/2020 1304   HGBUR NEGATIVE 11/25/2020 1304   BILIRUBINUR NEGATIVE 11/25/2020 1304   KETONESUR NEGATIVE 11/25/2020 1304   PROTEINUR 30 (A) 11/25/2020 1304   NITRITE NEGATIVE 11/25/2020 1304   LEUKOCYTESUR MODERATE (A) 11/25/2020 1304    Sepsis Labs: Lactic Acid, Venous    Component Value Date/Time   LATICACIDVEN 1.5 11/26/2020 0205    MICROBIOLOGY: Recent Results (from the past 240 hour(s))  SARS CORONAVIRUS 2 (TAT 6-24 HRS) Nasopharyngeal Nasopharyngeal Swab     Status: None   Collection Time: 11/21/20 11:49 AM   Specimen: Nasopharyngeal Swab  Result Value Ref Range Status   SARS Coronavirus 2 NEGATIVE NEGATIVE Final    Comment: (NOTE) SARS-CoV-2 target nucleic acids are NOT DETECTED.  The SARS-CoV-2 RNA is generally detectable in upper and lower respiratory specimens during the acute phase of infection. Negative results do not preclude SARS-CoV-2 infection, do not rule out co-infections with other pathogens, and should not be used as the sole basis for treatment or other patient management decisions. Negative results must be combined with clinical observations, patient history, and epidemiological information. The expected result is Negative.  Fact Sheet for Patients: SugarRoll.be  Fact Sheet for Healthcare Providers: https://www.woods-mathews.com/  This test is not yet approved or cleared by the [place] FDA and  has been authorized for detection and/or diagnosis of SARS-CoV-2 by FDA under an Emergency Use Authorization (EUA). This EUA will remain  in effect (meaning this test can be  used) for the duration of the COVID-19 declaration under Se ction 564(b)(1) of the Act, 21 U.S.C. section 360bbb-3(b)(1),  unless the authorization is terminated or revoked sooner.  Performed at [facility], [place] [address], [place], [place] [address]   Resp Panel by RT-PCR (Flu A&B, Covid) Nasopharyngeal Swab     Status: None   Collection Time: 11/25/20 11:22 AM   Specimen: Nasopharyngeal Swab; Nasopharyngeal(NP) swabs in vial transport medium  Result Value Ref Range Status   SARS Coronavirus 2 by RT PCR NEGATIVE NEGATIVE Final    Comment: (NOTE) SARS-CoV-2 target nucleic acids are NOT DETECTED.  The SARS-CoV-2 RNA is generally detectable in upper respiratory specimens during the acute phase of infection. The lowest concentration of SARS-CoV-2 viral copies this assay can detect is 138 copies/mL. A negative result does not preclude SARS-Cov-2 infection and should not be used as the sole basis for treatment or other patient management decisions. A negative result may occur with  improper specimen collection/handling, submission of specimen other than nasopharyngeal swab, presence of viral mutation(s) within the areas targeted by this assay, and inadequate number of viral copies(<138 copies/mL). A negative result must be combined with clinical observations, patient history, and epidemiological information. The expected result is Negative.  Fact Sheet for Patients:  EntrepreneurPulse.com.au  Fact Sheet for Healthcare Providers:  IncredibleEmployment.be  This test is no t yet approved or cleared by the [place] FDA and  has been authorized for detection and/or diagnosis of SARS-CoV-2 by FDA under an Emergency Use Authorization (EUA). This EUA will remain  in effect (meaning this test can be used) for the duration of the COVID-19 declaration under Section 564(b)(1) of the Act, 21 U.S.C.section 360bbb-3(b)(1), unless the authorization is  terminated  or revoked sooner.       Influenza A by PCR NEGATIVE NEGATIVE Final   Influenza B by PCR NEGATIVE NEGATIVE Final    Comment: (NOTE) The Xpert Xpress SARS-CoV-2/FLU/RSV plus assay is intended as an aid in the diagnosis of influenza from Nasopharyngeal swab specimens and should not be used as a sole basis for treatment. Nasal washings and aspirates are unacceptable for Xpert Xpress SARS-CoV-2/FLU/RSV testing.  Fact Sheet for Patients: EntrepreneurPulse.com.au  Fact Sheet for Healthcare Providers: IncredibleEmployment.be  This test is not yet approved or cleared by the [place] FDA and has been authorized for detection and/or diagnosis of SARS-CoV-2 by FDA under an Emergency Use Authorization (EUA). This EUA will remain in effect (meaning this test can be used) for the duration of the COVID-19 declaration under Section 564(b)(1) of the Act, 21 U.S.C. section 360bbb-3(b)(1), unless the authorization is terminated or revoked.  Performed at [facility], [place] [address] [place], [place] [address]   Blood culture (routine x 2)     Status: Abnormal   Collection Time: 11/25/20 11:54 AM   Specimen: BLOOD RIGHT ARM  Result Value Ref Range Status   Specimen Description BLOOD RIGHT ARM  Final   Special Requests   Final    BOTTLES DRAWN AEROBIC AND ANAEROBIC Blood Culture adequate volume   Culture  Setup Time   Final    GRAM NEGATIVE RODS IN BOTH AEROBIC AND ANAEROBIC BOTTLES CRITICAL RESULT CALLED TO, READ BACK BY AND VERIFIED WITH: C AMEND,PHARMD'@0647'$  11/26/20 [place] CORRECTED RESULTS PREVIOUSLY REPORTED AS: GRAM POSITIVE RODS IN BOTH AEROBIC AND ANAEROBIC BOTTLES CORRECTED RESULTS CALLED TO: Vena [place] ON [id number] AT 0900 BY E.PARRISH Performed at [facility], [place] [address], [place], [place] [address]    Culture PROTEUS MIRABILIS (A)  Final   Report Status 11/27/2020 FINAL  Final  Organism ID, Bacteria  PROTEUS MIRABILIS  Final      Susceptibility   Proteus mirabilis - MIC*    AMPICILLIN <=2 SENSITIVE Sensitive     CEFAZOLIN <=4 SENSITIVE Sensitive     CEFEPIME <=0.12 SENSITIVE Sensitive     CEFTAZIDIME <=1 SENSITIVE Sensitive     CEFTRIAXONE <=0.25 SENSITIVE Sensitive     CIPROFLOXACIN <=0.25 SENSITIVE Sensitive     GENTAMICIN <=1 SENSITIVE Sensitive     IMIPENEM 2 SENSITIVE Sensitive     TRIMETH/SULFA <=20 SENSITIVE Sensitive     AMPICILLIN/SULBACTAM <=2 SENSITIVE Sensitive     PIP/TAZO <=4 SENSITIVE Sensitive     * PROTEUS MIRABILIS  Blood Culture ID Panel (Reflexed)     Status: Abnormal   Collection Time: 11/25/20 11:54 AM  Result Value Ref Range Status   Enterococcus faecalis NOT DETECTED NOT DETECTED Final   Enterococcus Faecium NOT DETECTED NOT DETECTED Final   Listeria monocytogenes NOT DETECTED NOT DETECTED Final   Staphylococcus species NOT DETECTED NOT DETECTED Final   Staphylococcus aureus (BCID) NOT DETECTED NOT DETECTED Final   Staphylococcus epidermidis NOT DETECTED NOT DETECTED Final   Staphylococcus lugdunensis NOT DETECTED NOT DETECTED Final   Streptococcus species NOT DETECTED NOT DETECTED Final   Streptococcus agalactiae NOT DETECTED NOT DETECTED Final   Streptococcus pneumoniae NOT DETECTED NOT DETECTED Final   Streptococcus pyogenes NOT DETECTED NOT DETECTED Final   A.calcoaceticus-baumannii NOT DETECTED NOT DETECTED Final   Bacteroides fragilis NOT DETECTED NOT DETECTED Final   Enterobacterales DETECTED (A) NOT DETECTED Final    Comment: Enterobacterales represent a large order of gram negative bacteria, not a single organism. CRITICAL RESULT CALLED TO, READ BACK BY AND VERIFIED WITH: C AMEND,PHARMD'@0649'$  11/26/20 [place]    Enterobacter cloacae complex NOT DETECTED NOT DETECTED Final   Escherichia coli NOT DETECTED NOT DETECTED Final   Klebsiella aerogenes NOT DETECTED NOT DETECTED Final   Klebsiella oxytoca NOT DETECTED NOT DETECTED Final   Klebsiella  pneumoniae NOT DETECTED NOT DETECTED Final   Proteus species DETECTED (A) NOT DETECTED Final    Comment: CRITICAL RESULT CALLED TO, READ BACK BY AND VERIFIED WITH: C AMNED,PHARMD'@0649'$  11/26/20 [place]    Salmonella species NOT DETECTED NOT DETECTED Final   Serratia marcescens NOT DETECTED NOT DETECTED Final   Haemophilus influenzae NOT DETECTED NOT DETECTED Final   Neisseria meningitidis NOT DETECTED NOT DETECTED Final   Pseudomonas aeruginosa NOT DETECTED NOT DETECTED Final   Stenotrophomonas maltophilia NOT DETECTED NOT DETECTED Final   Candida albicans NOT DETECTED NOT DETECTED Final   Candida auris NOT DETECTED NOT DETECTED Final   Candida glabrata NOT DETECTED NOT DETECTED Final   Candida krusei NOT DETECTED NOT DETECTED Final   Candida parapsilosis NOT DETECTED NOT DETECTED Final   Candida tropicalis NOT DETECTED NOT DETECTED Final   Cryptococcus neoformans/gattii NOT DETECTED NOT DETECTED Final   CTX-M ESBL NOT DETECTED NOT DETECTED Final   Carbapenem resistance IMP NOT DETECTED NOT DETECTED Final   Carbapenem resistance KPC NOT DETECTED NOT DETECTED Final   Carbapenem resistance NDM NOT DETECTED NOT DETECTED Final   Carbapenem resist OXA 48 LIKE NOT DETECTED NOT DETECTED Final   Carbapenem resistance VIM NOT DETECTED NOT DETECTED Final    Comment: Performed at [facility], [address], [place], [place] [address]  Blood culture (routine x 2)     Status: Abnormal   Collection Time: 11/25/20  2:39 PM   Specimen: BLOOD  Result Value Ref Range Status   Specimen Description  BLOOD SITE NOT SPECIFIED  Final   Special Requests BOTTLES DRAWN AEROBIC ONLY  Final   Culture  Setup Time   Final    GRAM NEGATIVE RODS AEROBIC BOTTLE ONLY CRITICAL VALUE NOTED.  VALUE IS CONSISTENT WITH PREVIOUSLY REPORTED AND CALLED VALUE.    Culture (A)  Final    PROTEUS MIRABILIS SUSCEPTIBILITIES PERFORMED ON PREVIOUS CULTURE WITHIN THE LAST 5 DAYS. Performed at [facility], [place]  [address], [place], [place] [address]    Report Status 11/27/2020 FINAL  Final  Urine Culture     Status: Abnormal   Collection Time: 11/25/20  3:31 PM   Specimen: Urine, Clean Catch  Result Value Ref Range Status   Specimen Description URINE, CLEAN CATCH  Final   Special Requests   Final    NONE Performed at [facility], [place] [address] [place], [place] [address]    Culture >=100,000 COLONIES/mL KLEBSIELLA PNEUMONIAE (A)  Final   Report Status 11/28/2020 FINAL  Final   Organism ID, Bacteria KLEBSIELLA PNEUMONIAE (A)  Final      Susceptibility   Klebsiella pneumoniae - MIC*    AMPICILLIN >=32 RESISTANT Resistant     CEFAZOLIN <=4 SENSITIVE Sensitive     CEFEPIME <=0.12 SENSITIVE Sensitive     CEFTRIAXONE <=0.25 SENSITIVE Sensitive     CIPROFLOXACIN <=0.25 SENSITIVE Sensitive     GENTAMICIN <=1 SENSITIVE Sensitive     IMIPENEM <=0.25 SENSITIVE Sensitive     NITROFURANTOIN 128 RESISTANT Resistant     TRIMETH/SULFA <=20 SENSITIVE Sensitive     AMPICILLIN/SULBACTAM >=32 RESISTANT Resistant     PIP/TAZO 16 SENSITIVE Sensitive     * >=100,000 COLONIES/mL KLEBSIELLA PNEUMONIAE    RADIOLOGY STUDIES/RESULTS: No results found.   LOS: 3 days   Oren Binet, MD  [facility]    To contact the attending provider between 7A-7P or the covering provider during after hours 7P-7A, please log into the web site [email or web address] and access using universal [place] password for that web site. If you do not have the password, please call the hospital operator.  11/29/2020, 11:12 AM

## 2020-11-29 NOTE — Care Management Important Message (Signed)
Important Message  Patient Details  Name: Summer Hawkins MRN: [id number] Date of Birth: June 04, 1934   Medicare Important Message Given:  Yes     Memory Argue 11/29/2020, 1:14 PM

## 2020-11-29 NOTE — Congregational Nurse Program (Signed)
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 Several area snf near resident given that I had discussed with Khrystyne earlier last month.  [facility] being the primary one, then [facility] and then [facility].  Will keep in touch with patient for changes and needs.

## 2020-11-29 NOTE — TOC Initial Note (Signed)
Transition of Care [facility]) - Initial/Assessment Note    Patient Details  Name: Summer Hawkins MRN: [id number] Date of Birth: 1934/04/17  Transition of Care [facility]) CM/SW Contact:    Coralee Pesa, [place] Phone Number: 11/29/2020, 1:07 PM  Clinical Narrative:                 CSW met with pt at bedside. Pt noted that she has been at [facility] for short term rehab, but did not want to return. Pt noted if she does not have Medicare days, She cannot afford a co pay and would like to go home. She asked for CSW to follow up with her granddaughter, and friend Suanne Marker who works at [employer]. CSW confirmed with [facility] that pt has a supplement to Medicare, so would be able to go to SNF without paying a copay. CSW spoke with granddaughter who is agreeable to SNF with no preference, She gave CSW the number for [facility] ([phone]). Suanne Marker noted she would like for pt to go to [facility], [facility], or [facility]. CSW will update pt and faxout for SNF. TOC will continue to follow.  Expected Discharge Plan: Skilled Nursing Facility Barriers to Discharge: SNF Pending bed offer, Continued Medical Work up   Patient Goals and CMS Choice Patient states their goals for this hospitalization and ongoing recovery are:: Pt with the goal of recieving rehab to be more independent and return home. CMS Medicare.gov Compare Post Acute Care list provided to:: Patient Choice offered to / list presented to : Patient, Adult Children  Expected Discharge Plan and Services Expected Discharge Plan: [place] Choice: [place] Living arrangements for the past 2 months: [place]                                      Prior Living Arrangements/Services Living arrangements for the past 2 months: [place] Lives with:: Facility Resident Patient language and need for interpreter reviewed:: Yes Do you feel safe going back to the place  where you live?: No   Does not want to return to [facility].  Need for Family Participation in Patient Care: Yes (Comment) Care giver support system in place?: Yes (comment) Current home services: Homehealth aide Criminal Activity/Legal Involvement Pertinent to Current Situation/Hospitalization: No - Comment as needed  Activities of Daily Living Home Assistive Devices/Equipment: Walker (specify type), CBG Meter, Eyeglasses, Wheelchair, Dentures (specify type) ADL Screening (condition at time of admission) Patient's cognitive ability adequate to safely complete daily activities?: No Is the patient deaf or have difficulty hearing?: No Does the patient have difficulty seeing, even when wearing glasses/contacts?: No Does the patient have difficulty concentrating, remembering, or making decisions?: Yes Patient able to express need for assistance with ADLs?: Yes Does the patient have difficulty dressing or bathing?: Yes Independently performs ADLs?: No Communication: Independent Dressing (OT): Needs assistance Is this a change from baseline?: Pre-admission baseline Grooming: Needs assistance Is this a change from baseline?: Pre-admission baseline Feeding: Independent Bathing: Needs assistance Is this a change from baseline?: Pre-admission baseline Toileting: Needs assistance Is this a change from baseline?: Pre-admission baseline In/Out Bed: Needs assistance Is this a change from baseline?: Pre-admission baseline Walks in Home: Needs assistance Is this a change from baseline?: Pre-admission baseline Does the patient have difficulty walking or climbing stairs?: Yes Weakness of Legs: Both Weakness  of Arms/Hands: None  Permission Sought/Granted Permission sought to share information with : Family Supports Permission granted to share information with : Yes, Verbal Permission Granted  Share Information with NAME: Genella Rife, Granddaughter; Velva Harman, Friend            Emotional Assessment Appearance:: Appears stated age Attitude/Demeanor/Rapport: Engaged Affect (typically observed): Appropriate Orientation: : Oriented to Self, Oriented to Place, Oriented to  Time, Oriented to Situation Alcohol / Substance Use: Not Applicable Psych Involvement: No (comment)  Admission diagnosis:  Acute UTI [N39.0] Acute febrile illness [R50.9] Sepsis secondary to UTI ([place]) [A41.9, N39.0] Elevated lactic acid level [R79.89] Sepsis due to urinary tract infection ([place]) [A41.9, N39.0] Sepsis ([place]) [A41.9] Leukocytosis, unspecified type [D72.829] Acute alteration in mental status [R41.82] Patient Active Problem List   Diagnosis Date Noted   Sepsis ([place]) 11/26/2020   Back pain with history of spinal surgery 11/26/2020   Gram-negative bacteremia 11/26/2020   Sepsis due to urinary tract infection ([place]) 20/80/2233   Acute metabolic encephalopathy 61/22/4497   Type 2 diabetes mellitus without complication, with long-term current use of insulin ([place]) 11/25/2020   Hypothyroidism 11/25/2020   CKD (chronic kidney disease), stage III ([place]) 11/25/2020   Spondylolisthesis, lumbar region 11/17/2020   Posterior capsular opacification, left 03/31/2020   Posterior vitreous detachment of both eyes 03/31/2020   Intermediate stage nonexudative age-related macular degeneration of both eyes 09/25/2019   Moderate nonproliferative diabetic retinopathy of both eyes ([place]) 09/25/2019   Pacemaker 03/20/2019   Symptomatic bradycardia 11/30/2018   Syncope and collapse 11/21/2018   Bradycardia 09/25/2017   Permanent atrial fibrillation ([place])    Chronic diastolic CHF (congestive heart failure) ([place]) 08/08/2017   Essential hypertension 08/08/2017   Breast cancer of upper-outer quadrant of left female breast ([place]) 01/05/2016   Chronic atrial fibrillation ([place]) 01/05/2011   PCP:  Haywood Pao, MD Pharmacy:   [facility] [place] [place] [facility] [address] [place] [address] [address] Phone: [phone] Fax: [phone]  [facility]  ([place]) - [place], [facility] [address] [place] [address] Phone: [phone] Fax: [place], [place] [address] Phone: [phone] Fax: [phone]     Social Determinants of Health (SDOH) Interventions    Readmission Risk Interventions No flowsheet data found.

## 2020-11-29 NOTE — Progress Notes (Signed)
[place] for Infectious Disease  Date of Admission:  11/25/2020           Reason for visit: Follow up on bacteremia  Current antibiotics: Cefazolin 9/18-pres   Previous antibiotics: Meropenem 9/15 Vancomycin 9/15 Ceftriaxone 9/15-9/18 Doxycycline 9/16   ASSESSMENT:    86 y.o. female admitted with:  Proteus mirabilis bacteremia Klebsiella pneumonia positive urine cultures Recent PLIF with neurosurgery 11/17/2020 with postoperative drainage CKD Diabetes  RECOMMENDATIONS:    Continue cefazolin dosed by pharmacy for renal function Monitor fever curve, WBC, wound drainage Monitor back pain and will consider imaging of the lumbar spine if shows any progression in pain or drainage from wound increases given the discordance between blood cultures and urine cultures.  Wound looks ok per NSGY Wound care, glycemic control Will follow   Principal Problem:   Gram-negative bacteremia Active Problems:   Chronic atrial fibrillation (HCC)   Breast cancer of upper-outer quadrant of left female breast (HCC)   Chronic diastolic CHF (congestive heart failure) (HCC)   Essential hypertension   Sepsis due to urinary tract infection (HCC)   Acute metabolic encephalopathy   Type 2 diabetes mellitus without complication, with long-term current use of insulin (HCC)   Hypothyroidism   CKD (chronic kidney disease), stage III (HCC)   Sepsis (HCC)   Back pain with history of spinal surgery    MEDICATIONS:    Scheduled Meds:  amLODipine  5 mg Oral Daily   anastrozole  1 mg Oral Daily   apixaban  2.5 mg Oral BID   benzonatate  200 mg Oral TID   docusate sodium  100 mg Oral BID   ferrous sulfate  325 mg Oral Q breakfast   insulin aspart  0-15 Units Subcutaneous TID WC   insulin aspart  0-5 Units Subcutaneous QHS   insulin detemir  12 Units Subcutaneous QHS   levothyroxine  137 mcg Oral QAC breakfast   metoprolol succinate  25 mg Oral Daily   multivitamin with minerals  1  tablet Oral Daily   senna  2 tablet Oral QHS   sodium chloride flush  3 mL Intravenous Q12H   timolol  1 drop Both Eyes q morning   Continuous Infusions:  sodium chloride      ceFAZolin (ANCEF) IV 2 g (11/29/20 0533)   PRN Meds:.sodium chloride, acetaminophen **OR** acetaminophen, alum & mag hydroxide-simeth, oxyCODONE, polyethylene glycol, sodium chloride flush  SUBJECTIVE:   24 hour events:  No events over the weekend Blood cultures with Proteus mirabilis Urine cultures with Klebsiella pneumonia Transitioned to cefazolin yesterday No new labs this morning Afebrile, T-max 99.6 Continues to be followed by neurosurgery No new imaging  She reports continued back pain and asking to be repositioned in the bed.  She thinks her flank hurts as well.  No fevers.   Review of Systems  All other systems reviewed and are negative.    OBJECTIVE:   Blood pressure (!) 146/81, pulse 84, temperature 98.1 F (36.7 C), temperature source Oral, resp. rate 16, height '5\' 4"'$  (1.626 m), weight 95.4 kg, SpO2 97 %. Body mass index is 36.1 kg/m.  Physical Exam Constitutional:      General: She is not in acute distress.    Appearance: Normal appearance.     Comments: Appears uncomfortable in bed lying somewhat on her left side  HENT:     Head: Normocephalic and atraumatic.  Eyes:     Extraocular Movements: Extraocular movements intact.  Conjunctiva/sclera: Conjunctivae normal.  Pulmonary:     Effort: Pulmonary effort is normal. No respiratory distress.  Abdominal:     Palpations: Abdomen is soft.     Tenderness: There is no abdominal tenderness.  Musculoskeletal:     Comments: She is moving her upper and lower extremities freely.  Skin:    General: Skin is warm and dry.     Findings: No rash.  Neurological:     General: No focal deficit present.     Mental Status: She is alert and oriented to person, place, and time.  Psychiatric:        Mood and Affect: Mood normal.         Behavior: Behavior normal.     Lab Results: Lab Results  Component Value Date   WBC 13.5 (H) 11/28/2020   HGB 10.2 (L) 11/28/2020   HCT 31.6 (L) 11/28/2020   MCV 97.2 11/28/2020   PLT 346 11/28/2020    Lab Results  Component Value Date   NA 133 (L) 11/28/2020   K 5.0 11/28/2020   CO2 21 (L) 11/28/2020   GLUCOSE 205 (H) 11/28/2020   BUN 37 (H) 11/28/2020   CREATININE 1.73 (H) 11/28/2020   CALCIUM 8.4 (L) 11/28/2020   GFRNONAA 29 (L) 11/28/2020   GFRAA 41 (L) 12/01/2018    Lab Results  Component Value Date   ALT 54 (H) 11/26/2020   AST 53 (H) 11/26/2020   ALKPHOS 123 11/26/2020   BILITOT 1.1 11/26/2020    No results found for: CRP  No results found for: ESRSEDRATE   I have reviewed the micro and lab results in Epic.  Imaging: No results found.   Imaging independently reviewed in Epic.    Raynelle Highland for Infectious Disease [facility] [phone] pager 11/29/2020, 8:37 AM  I spent greater than 35 minutes with the patient including greater than 50% of time in face to face counsel of the patient and in coordination of their care.

## 2020-11-29 NOTE — Progress Notes (Signed)
Subjective: The patient is alert and pleasant.  She is mildly confused.  She is in no apparent distress.  She says she has not ambulated since this hospitalization.  Objective: Vital signs in last 24 hours: Temp:  [97.8 F (36.6 C)-99.6 F (37.6 C)] 98.1 F (36.7 C) (09/19 0809) Pulse Rate:  [70-84] 84 (09/19 0700) Resp:  [14-27] 16 (09/19 0700) BP: (108-146)/(61-81) 146/81 (09/19 0809) SpO2:  [96 %-100 %] 97 % (09/19 0700) Weight:  [95.4 kg] 95.4 kg (09/19 0500) Estimated body mass index is 36.1 kg/m as calculated from the following:   Height as of this encounter: '5\' 4"'$  (1.626 m).   Weight as of this encounter: 95.4 kg.   Intake/Output from previous day: 09/18 0701 - 09/19 0700 In: -  Out: 550 [Urine:200; Stool:350] Intake/Output this shift: No intake/output data recorded.  Physical exam the patient is alert and pleasant.  Her strength is normal.  The patient's wound is healing well with some eschar.  I do not see any drainage.  Lab Results: Recent Labs    11/27/20 0041 11/28/20 0100  WBC 13.4* 13.5*  HGB 10.0* 10.2*  HCT 30.2* 31.6*  PLT 310 346   BMET Recent Labs    11/27/20 0041 11/28/20 0100  NA 135 133*  K 5.2* 5.0  CL 103 101  CO2 20* 21*  GLUCOSE 206* 205*  BUN 35* 37*  CREATININE 1.68* 1.73*  CALCIUM 8.4* 8.4*    Studies/Results: No results found.  Assessment/Plan: Status post lumbar fusion: I will ask PT to ambulate the patient.  LOS: 3 days     [name] 11/29/2020, 10:56 AM     Patient ID: Summer Hawkins, female   DOB: [birth year], 86 y.o.   MRN: [id number]

## 2020-11-29 NOTE — NC FL2 (Signed)
[place] MEDICAID FL2 LEVEL OF CARE SCREENING TOOL     IDENTIFICATION  Patient Name: Summer Hawkins Birthdate: [birth year] Sex: female Admission Date (Current Location): 11/25/2020  [facility] and [place] Number:  Herbalist and Address:  [facility] [place], [place] [address] [place], [place] [address]      Provider Number: [id number]  Attending Physician Name and Address:  Jonetta Osgood, MD  Relative Name and Phone Number:  Genella Rife, [phone]    Current Level of Care: Hospital Recommended Level of Care: [place] Prior Approval Number:    Date Approved/Denied:   PASRR Number: [id number]  Discharge Plan: SNF    Current Diagnoses: Patient Active Problem List   Diagnosis Date Noted   Sepsis ([place]) 11/26/2020   Back pain with history of spinal surgery 11/26/2020   Gram-negative bacteremia 11/26/2020   Sepsis due to urinary tract infection ([place]) [id number]   Acute metabolic encephalopathy [id number]   Type 2 diabetes mellitus without complication, with long-term current use of insulin ([place]) 11/25/2020   Hypothyroidism 11/25/2020   CKD (chronic kidney disease), stage III ([place]) 11/25/2020   Spondylolisthesis, lumbar region 11/17/2020   Posterior capsular opacification, left 03/31/2020   Posterior vitreous detachment of both eyes 03/31/2020   Intermediate stage nonexudative age-related macular degeneration of both eyes 09/25/2019   Moderate nonproliferative diabetic retinopathy of both eyes ([place]) 09/25/2019   Pacemaker 03/20/2019   Symptomatic bradycardia 11/30/2018   Syncope and collapse 11/21/2018   Bradycardia 09/25/2017   Permanent atrial fibrillation (HCC)    Chronic diastolic CHF (congestive heart failure) ([place]) 08/08/2017   Essential hypertension 08/08/2017   Breast cancer of upper-outer quadrant of left female breast ([place]) 01/05/2016   Chronic atrial fibrillation ([place]) 01/05/2011    Orientation RESPIRATION  BLADDER Height & Weight     Self, Time, Place  Normal Continent, External catheter Weight: 210 lb 5.1 oz (95.4 kg) Height:  '5\' 4"'$  (162.6 cm)  BEHAVIORAL SYMPTOMS/MOOD NEUROLOGICAL BOWEL NUTRITION STATUS      Continent Diet (see discharge summary)  AMBULATORY STATUS COMMUNICATION OF NEEDS Skin   Extensive Assist Verbally Surgical wounds, PU Stage and Appropriate Care (PI st2 Coccyx, Back surgical Incision)                       Personal Care Assistance Level of Assistance  Bathing, Feeding, Dressing Bathing Assistance: Maximum assistance Feeding assistance: Limited assistance Dressing Assistance: Maximum assistance     Functional Limitations Info  Sight, Hearing, Speech Sight Info: Adequate Hearing Info: Adequate Speech Info: Adequate    SPECIAL CARE FACTORS FREQUENCY  PT (By licensed PT), OT (By licensed OT)     PT Frequency: 5x week OT Frequency: 5x week            Contractures Contractures Info: Not present    Additional Factors Info  Code Status, Allergies Code Status Info: DNR Allergies Info: Bee Venom, Penicillins, Liraglutide, Lisinopril, Zocor           Current Medications (11/29/2020):  This is the current hospital active medication list Current Facility-Administered Medications  Medication Dose Route Frequency Provider Last Rate Last Admin   0.9 %  sodium chloride infusion  250 mL Intravenous PRN Orma Flaming, MD       acetaminophen (TYLENOL) tablet 650 mg  650 mg Oral Q6H PRN Orma Flaming, MD   650 mg at 11/26/20 0134   Or   acetaminophen (TYLENOL) suppository 650 mg  650 mg  Rectal Q6H PRN Orma Flaming, MD       alum & mag hydroxide-simeth (MAALOX/MYLANTA) 200-200-20 MG/5ML suspension 30 mL  30 mL Oral Q4H PRN Jonetta Osgood, MD   30 mL at 11/28/20 0113   amLODipine (NORVASC) tablet 5 mg  5 mg Oral Daily Orma Flaming, MD   5 mg at 11/29/20 0827   anastrozole (ARIMIDEX) tablet 1 mg  1 mg Oral Daily Orma Flaming, MD   1 mg at 11/29/20  0830   apixaban (ELIQUIS) tablet 2.5 mg  2.5 mg Oral BID Rise Patience, MD   2.5 mg at 11/29/20 0831   benzonatate (TESSALON) capsule 200 mg  200 mg Oral TID Jonetta Osgood, MD   200 mg at 11/29/20 0827   ceFAZolin (ANCEF) IVPB 2g/100 mL premix  2 g Intravenous Q12H Levonne Spiller, RPH 200 mL/hr at 11/29/20 0533 2 g at 11/29/20 0533   docusate sodium (COLACE) capsule 100 mg  100 mg Oral BID Orma Flaming, MD   100 mg at 11/29/20 [id number]   ferrous sulfate tablet 325 mg  325 mg Oral Q breakfast Orma Flaming, MD   325 mg at 11/29/20 0826   insulin aspart (novoLOG) injection 0-15 Units  0-15 Units Subcutaneous TID WC Edwin Dada, MD   5 Units at 11/29/20 1219   insulin aspart (novoLOG) injection 0-5 Units  0-5 Units Subcutaneous QHS Edwin Dada, MD   2 Units at 11/28/20 2139   insulin detemir (LEVEMIR) injection 12 Units  12 Units Subcutaneous QHS Jonetta Osgood, MD   12 Units at 11/28/20 2139   levothyroxine (SYNTHROID) tablet 137 mcg  137 mcg Oral QAC breakfast Orma Flaming, MD   137 mcg at 11/29/20 0531   metoprolol succinate (TOPROL-XL) 24 hr tablet 25 mg  25 mg Oral Daily Orma Flaming, MD   25 mg at 11/29/20 [id number]   multivitamin with minerals tablet 1 tablet  1 tablet Oral Daily Orma Flaming, MD   1 tablet at 11/29/20 0830   oxyCODONE (Oxy IR/ROXICODONE) immediate release tablet 10 mg  10 mg Oral Q6H PRN Orma Flaming, MD   10 mg at 11/29/20 0430   polyethylene glycol (MIRALAX / GLYCOLAX) packet 17 g  17 g Oral Daily PRN Orma Flaming, MD       senna (SENOKOT) tablet 17.2 mg  2 tablet Oral QHS Orma Flaming, MD   17.2 mg at 11/28/20 2138   sodium chloride flush (NS) 0.9 % injection 3 mL  3 mL Intravenous Q12H Orma Flaming, MD   3 mL at 11/29/20 0834   sodium chloride flush (NS) 0.9 % injection 3 mL  3 mL Intravenous PRN Orma Flaming, MD       timolol (TIMOPTIC) 0.5 % ophthalmic solution 1 drop  1 drop Both Eyes q morning Orma Flaming, MD   1 drop  at 11/29/20 [id number]     Discharge Medications: Please see discharge summary for a list of discharge medications.  Relevant Imaging Results:  Relevant Lab Results:   Additional Information SSN [id number]. Pt has been vaccinated for covid with one booster.  Coralee Pesa, LCSWA

## 2020-11-30 DIAGNOSIS — N183 Chronic kidney disease, stage 3 unspecified: Secondary | ICD-10-CM | POA: Diagnosis not present

## 2020-11-30 DIAGNOSIS — R7881 Bacteremia: Secondary | ICD-10-CM | POA: Diagnosis not present

## 2020-11-30 DIAGNOSIS — N390 Urinary tract infection, site not specified: Secondary | ICD-10-CM | POA: Diagnosis not present

## 2020-11-30 DIAGNOSIS — M549 Dorsalgia, unspecified: Secondary | ICD-10-CM | POA: Diagnosis not present

## 2020-11-30 LAB — CBC
HCT: 33 % — ABNORMAL LOW (ref 36.0–46.0)
Hemoglobin: 10.4 g/dL — ABNORMAL LOW (ref 12.0–15.0)
MCH: 30.5 pg (ref 26.0–34.0)
MCHC: 31.5 g/dL (ref 30.0–36.0)
MCV: 96.8 fL (ref 80.0–100.0)
Platelets: 414 10*3/uL — ABNORMAL HIGH (ref 150–400)
RBC: 3.41 MIL/uL — ABNORMAL LOW (ref 3.87–5.11)
RDW: 13.9 % (ref 11.5–15.5)
WBC: 14.9 10*3/uL — ABNORMAL HIGH (ref 4.0–10.5)
nRBC: 0 % (ref 0.0–0.2)

## 2020-11-30 LAB — BASIC METABOLIC PANEL
Anion gap: 14 (ref 5–15)
BUN: 26 mg/dL — ABNORMAL HIGH (ref 8–23)
CO2: 21 mmol/L — ABNORMAL LOW (ref 22–32)
Calcium: 8.8 mg/dL — ABNORMAL LOW (ref 8.9–10.3)
Chloride: 96 mmol/L — ABNORMAL LOW (ref 98–111)
Creatinine, Ser: 1.36 mg/dL — ABNORMAL HIGH (ref 0.44–1.00)
GFR, Estimated: 38 mL/min — ABNORMAL LOW (ref >60)
Glucose, Bld: 163 mg/dL — ABNORMAL HIGH (ref 70–99)
Potassium: 4.5 mmol/L (ref 3.5–5.1)
Sodium: 131 mmol/L — ABNORMAL LOW (ref 135–145)

## 2020-11-30 LAB — GLUCOSE, CAPILLARY
Glucose-Capillary: 163 mg/dL — ABNORMAL HIGH (ref 70–99)
Glucose-Capillary: 198 mg/dL — ABNORMAL HIGH (ref 70–99)
Glucose-Capillary: 251 mg/dL — ABNORMAL HIGH (ref 70–99)
Glucose-Capillary: 218 mg/dL — ABNORMAL HIGH (ref 70–99)

## 2020-11-30 LAB — RESP PANEL BY RT-PCR (FLU A&B, COVID) ARPGX2
Influenza A by PCR: NEGATIVE
Influenza B by PCR: NEGATIVE
SARS Coronavirus 2 by RT PCR: NEGATIVE

## 2020-11-30 MED ORDER — CEPHALEXIN 500 MG PO CAPS
500.0000 mg | ORAL_CAPSULE | Freq: Four times a day (QID) | ORAL | Status: DC
  Administered 2020-11-30 – 2020-12-01 (×6): 500 mg via ORAL
  Filled 2020-11-30 (×6): qty 1

## 2020-11-30 NOTE — Progress Notes (Signed)
Speech Language Pathology Treatment: Dysphagia  Patient Details Name: Summer Hawkins MRN: [id number] DOB: Jun 09, 1934 Today's Date: 11/30/2020 Time: 3496-1164 SLP Time Calculation (min) (ACUTE ONLY): 13 min  Assessment / Plan / Recommendation Clinical Impression  SLP followed up for diet tolerance. Pt assessed with regular snack and thin liquids via cup and straw this date. No overt s/sx of aspiration with any PO including consecutive straw sips. Per nurse tech, pt tolerating current diet well. No further ST needs identified.    HPI HPI: Pt is an 86 y.o. female who presented to the ED on 11/25/2020 with a chief complaint of weakness/confusion. CT head negative & CXR negative. Dx UTI & sepsis. SLP consulted due to pt "coughing and gagging when taking pills". PMH: atrial fibrillation on eliquis, chronic diastolic CHF, hypertension, hypothyroidism, bradycardia status post pacemaker placement, history of breast cancer, s/p two-level PLIF by Dr. Arnoldo Morale on 11/17/2020      SLP Plan  All goals met;Discharge SLP treatment due to (comment)      Recommendations for follow up therapy are one component of a multi-disciplinary discharge planning process, led by the attending physician.  Recommendations may be updated based on patient status, additional functional criteria and insurance authorization.    Recommendations  Diet recommendations: Regular;Thin liquid Liquids provided via: Cup;Straw (straws as tolerated) Medication Administration: Whole meds with puree Supervision: Patient able to self feed Compensations: Slow rate;Small sips/bites Postural Changes and/or Swallow Maneuvers: Seated upright 90 degrees;Upright 30-60 min after meal                Oral Care Recommendations: Oral care BID Follow up Recommendations: Other (comment) (per PT/OT recs) SLP Visit Diagnosis: Dysphagia, unspecified (R13.10) Plan: All goals met;Discharge SLP treatment due to (comment)       [place], CCC-SLP Acute Rehabilitation Services    11/30/2020, 3:02 PM

## 2020-11-30 NOTE — Progress Notes (Signed)
PROGRESS NOTE        PATIENT DETAILS Name: Summer Hawkins Age: 86 y.o. Sex: female Date of Birth: 03-15-1934 Admit Date: 11/25/2020 Admitting Physician Edwin Dada, MD AYT:[id number], Fransico Him, MD  Brief Narrative: Patient is a 86 y.o. female who went decompressive two-level lumbar spine surgery on 9/7-presented from SNF with fever/confusion-she was subsequently found to have severe sepsis due to Proteus bacteremia.  Subjective: Lying comfortably in bed-back pain is stable.  No chest pain or shortness of breath.  Objective: Vitals: Blood pressure (!) 143/94, pulse 70, temperature 97.8 F (36.6 C), temperature source Oral, resp. rate (!) 21, height 5\' 4"  (1.626 m), weight 95.3 kg, SpO2 98 %.   Exam: Gen Exam:Alert awake-not in any distress HEENT:atraumatic, normocephalic Chest: B/L clear to auscultation anteriorly CVS:S1S2 regular Abdomen:soft non tender, non distended Extremities:no edema Neurology: Moving all 4 extremities. Skin: no rash   Pertinent Labs/Radiology: WBC: 14.9 Hb: 10.4 Platelet: 414 Creatinine 1.36 Blood culture: Proteus mirabilis (pansensitive) Urine Culture: Klebsiella pneumoniae  Assessment/Plan: Severe sepsis due to Proteus bacteremia/Klebsiella UTI: Sepsis physiology has resolved.  Due to discordant organisms and blood in urine-some concern that bacteremia could have originated from her back wound-however per neurosurgery-no evidence of wound infection.  Clinically does not appear to have a deep tissue infection as well.  Discussed with ID MD-plans are to transition to oral cephalexin-total of 2 weeks of antibiotics with end date of 9/29.  Will need to be watched to see if she develops any signs of postoperative back infection.  Acute metabolic encephalopathy: Due to sepsis-resolved.  CT head negative.  She is awake and alert this morning-however has had some issues with mild delirium (on oxycodone for pain)   Recent  lumbar spine surgery: No obvious wound infection per neurosurgery-see above concerns.  Hyperkalemia: Resolved.  PAF: Rate controlled-continue metoprolol and Eliquis.  Sick sinus syndrome-s/p PPM implantation: Continue telemetry monitoring.  Chronic diastolic heart failure: Euvolemic on exam.  CKD stage IIIb: Creatinine close to baseline  Normocytic anemia: Hemoglobin stable-follow periodically.  HTN: BP controlled-continue amlodipine and metoprolol.  DM-2 (A1c 7.09/15): CBGs stable-continue Levemir 12 units and SSI.  Reassess on 9/20.  Recent Labs    11/29/20 2026 11/30/20 0721 11/30/20 1143  GLUCAP 266* 163* 198*     Hypothyroidism: Continue Synthroid.  TSH is stable.  History of breast cancer: Continue Arimidex-stable for outpatient follow-up with her primary oncologist.  Obesity: Estimated body mass index is 36.06 kg/m as calculated from the following:   Height as of this encounter: 5\' 4"  (1.626 m).   Weight as of this encounter: 95.3 kg.    Procedures :None Consults:ID, neurosurgery DVT Prophylaxis :Eliquis Code Status:Full code  Family Communication:Granddaughter-[phone]-updated over the phone on 9/19  Time spent: 25-minutes-Greater than 50% of this time was spent in counseling, explanation of diagnosis, planning of further management, and coordination of care.  Diet: Diet Order             Diet Carb Modified Fluid consistency: Thin; Room service appropriate? Yes  Diet effective now                      Disposition Plan: Status is: Inpatient  Remains inpatient appropriate because:Inpatient level of care appropriate due to severity of illness  Dispo: The patient is from: SNF  Anticipated d/c is to: Home              Patient currently is not medically stable to d/c.   Difficult to place patient No    Barriers to Discharge: Awaiting SNF bed.  Antimicrobial agents: Anti-infectives (From admission, onward)    Start      Dose/Rate Route Frequency Ordered Stop   11/30/20 1300  cephALEXin (KEFLEX) capsule 500 mg        500 mg Oral Every 6 hours 11/30/20 1045 12/09/20 2359   11/28/20 1800  ceFAZolin (ANCEF) IVPB 2g/100 mL premix  Status:  Discontinued        2 g 200 mL/hr over 30 Minutes Intravenous Every 12 hours 11/28/20 0928 11/30/20 1045   11/28/20 0600  vancomycin (VANCOCIN) IVPB 1000 mg/200 mL premix  Status:  Discontinued        1,000 mg 200 mL/hr over 60 Minutes Intravenous Every 48 hours 11/26/20 0617 11/26/20 0751   11/26/20 1800  cefTRIAXone (ROCEPHIN) 2 g in sodium chloride 0.9 % 100 mL IVPB  Status:  Discontinued        2 g 200 mL/hr over 30 Minutes Intravenous Every 24 hours 11/26/20 0753 11/28/20 0903   11/26/20 0600  vancomycin (VANCOREADY) IVPB 1500 mg/300 mL  Status:  Discontinued        1,500 mg 150 mL/hr over 120 Minutes Intravenous STAT 11/26/20 0556 11/26/20 0858   11/26/20 0500  meropenem (MERREM) 1 g in sodium chloride 0.9 % 100 mL IVPB  Status:  Discontinued        1 g 200 mL/hr over 30 Minutes Intravenous Every 12 hours 11/25/20 1734 11/26/20 0751   11/26/20 0045  doxycycline (VIBRA-TABS) tablet 100 mg  Status:  Discontinued        100 mg Oral Every 12 hours 11/26/20 0040 11/26/20 1107   11/25/20 1715  meropenem (MERREM) 1 g in sodium chloride 0.9 % 100 mL IVPB        1 g 200 mL/hr over 30 Minutes Intravenous  Once 11/25/20 1710 11/25/20 1917   11/25/20 1400  cefTRIAXone (ROCEPHIN) 1 g in sodium chloride 0.9 % 100 mL IVPB        1 g 200 mL/hr over 30 Minutes Intravenous  Once 11/25/20 1347 11/25/20 1549        MEDICATIONS: Scheduled Meds:  amLODipine  5 mg Oral Daily   anastrozole  1 mg Oral Daily   apixaban  2.5 mg Oral BID   benzonatate  200 mg Oral TID   cephALEXin  500 mg Oral Q6H   docusate sodium  100 mg Oral BID   ferrous sulfate  325 mg Oral Q breakfast   insulin aspart  0-15 Units Subcutaneous TID WC   insulin aspart  0-5 Units Subcutaneous QHS   insulin  detemir  12 Units Subcutaneous QHS   levothyroxine  137 mcg Oral QAC breakfast   metoprolol succinate  25 mg Oral Daily   multivitamin with minerals  1 tablet Oral Daily   senna  2 tablet Oral QHS   sodium chloride flush  3 mL Intravenous Q12H   timolol  1 drop Both Eyes q morning   Continuous Infusions:  sodium chloride 250 mL (11/29/20 1842)   PRN Meds:.sodium chloride, acetaminophen **OR** acetaminophen, alum & mag hydroxide-simeth, oxyCODONE, polyethylene glycol, sodium chloride flush   I have personally reviewed following labs and imaging studies  LABORATORY DATA: CBC: Recent Labs  Lab 11/25/20 1154 11/26/20 0257 11/26/20 3016  11/27/20 0041 11/28/20 0100 11/30/20 0201  WBC 22.6*  --  18.0* 13.4* 13.5* 14.9*  NEUTROABS  --   --  14.8*  --   --   --   HGB 11.5* 11.2* 10.2* 10.0* 10.2* 10.4*  HCT 36.1 33.0* 32.9* 30.2* 31.6* 33.0*  MCV 101.1*  --  102.5* 96.5 97.2 96.8  PLT 364  --  343 310 346 414*     Basic Metabolic Panel: Recent Labs  Lab 11/25/20 1154 11/26/20 0257 11/26/20 0626 11/27/20 0041 11/28/20 0100 11/30/20 0201  NA 134* 137 137 135 133* 131*  K 5.1 4.9 5.3* 5.2* 5.0 4.5  CL 102  --  107 103 101 96*  CO2 20*  --  18* 20* 21* 21*  GLUCOSE 275*  --  229* 206* 205* 163*  BUN 33*  --  35* 35* 37* 26*  CREATININE 1.69*  --  1.75* 1.68* 1.73* 1.36*  CALCIUM 8.9  --  8.5* 8.4* 8.4* 8.8*     GFR: Estimated Creatinine Clearance: 34.5 mL/min (A) (by C-G formula based on SCr of 1.36 mg/dL (H)).  Liver Function Tests: Recent Labs  Lab 11/25/20 1154 11/26/20 0626  AST 29 53*  ALT 52* 54*  ALKPHOS 94 123  BILITOT 1.1 1.1  PROT 6.6 5.9*  ALBUMIN 2.9* 2.5*    No results for input(s): LIPASE, AMYLASE in the last 168 hours. Recent Labs  Lab 11/25/20 2029  AMMONIA 17     Coagulation Profile: No results for input(s): INR, PROTIME in the last 168 hours.  Cardiac Enzymes: No results for input(s): CKTOTAL, CKMB, CKMBINDEX, TROPONINI in the  last 168 hours.  BNP (last 3 results) No results for input(s): PROBNP in the last 8760 hours.  Lipid Profile: No results for input(s): CHOL, HDL, LDLCALC, TRIG, CHOLHDL, LDLDIRECT in the last 72 hours.  Thyroid Function Tests: No results for input(s): TSH, T4TOTAL, FREET4, T3FREE, THYROIDAB in the last 72 hours.   Anemia Panel: No results for input(s): VITAMINB12, FOLATE, FERRITIN, TIBC, IRON, RETICCTPCT in the last 72 hours.   Urine analysis:    Component Value Date/Time   COLORURINE YELLOW 11/25/2020 1304   APPEARANCEUR HAZY (A) 11/25/2020 1304   LABSPEC 1.016 11/25/2020 1304   PHURINE 5.0 11/25/2020 1304   GLUCOSEU NEGATIVE 11/25/2020 1304   HGBUR NEGATIVE 11/25/2020 1304   BILIRUBINUR NEGATIVE 11/25/2020 1304   KETONESUR NEGATIVE 11/25/2020 1304   PROTEINUR 30 (A) 11/25/2020 1304   NITRITE NEGATIVE 11/25/2020 1304   LEUKOCYTESUR MODERATE (A) 11/25/2020 1304    Sepsis Labs: Lactic Acid, Venous    Component Value Date/Time   LATICACIDVEN 1.5 11/26/2020 0205    MICROBIOLOGY: Recent Results (from the past 240 hour(s))  SARS CORONAVIRUS 2 (TAT 6-24 HRS) Nasopharyngeal Nasopharyngeal Swab     Status: None   Collection Time: 11/21/20 11:49 AM   Specimen: Nasopharyngeal Swab  Result Value Ref Range Status   SARS Coronavirus 2 NEGATIVE NEGATIVE Final    Comment: (NOTE) SARS-CoV-2 target nucleic acids are NOT DETECTED.  The SARS-CoV-2 RNA is generally detectable in upper and lower respiratory specimens during the acute phase of infection. Negative results do not preclude SARS-CoV-2 infection, do not rule out co-infections with other pathogens, and should not be used as the sole basis for treatment or other patient management decisions. Negative results must be combined with clinical observations, patient history, and epidemiological information. The expected result is Negative.  Fact Sheet for Patients: SugarRoll.be  Fact Sheet for  Healthcare Providers: https://www.woods-mathews.com/  This test is not yet approved or cleared by the [place] and  has been authorized for detection and/or diagnosis of SARS-CoV-2 by FDA under an Emergency Use Authorization (EUA). This EUA will remain  in effect (meaning this test can be used) for the duration of the COVID-19 declaration under Se ction 564(b)(1) of the Act, 21 U.S.C. section 360bbb-3(b)(1), unless the authorization is terminated or revoked sooner.  Performed at [facility], [place] [address], [place], [place] [address]   Resp Panel by RT-PCR (Flu A&B, Covid) Nasopharyngeal Swab     Status: None   Collection Time: 11/25/20 11:22 AM   Specimen: Nasopharyngeal Swab; Nasopharyngeal(NP) swabs in vial transport medium  Result Value Ref Range Status   SARS Coronavirus 2 by RT PCR NEGATIVE NEGATIVE Final    Comment: (NOTE) SARS-CoV-2 target nucleic acids are NOT DETECTED.  The SARS-CoV-2 RNA is generally detectable in upper respiratory specimens during the acute phase of infection. The lowest concentration of SARS-CoV-2 viral copies this assay can detect is 138 copies/mL. A negative result does not preclude SARS-Cov-2 infection and should not be used as the sole basis for treatment or other patient management decisions. A negative result may occur with  improper specimen collection/handling, submission of specimen other than nasopharyngeal swab, presence of viral mutation(s) within the areas targeted by this assay, and inadequate number of viral copies(<138 copies/mL). A negative result must be combined with clinical observations, patient history, and epidemiological information. The expected result is Negative.  Fact Sheet for Patients:  EntrepreneurPulse.com.au  Fact Sheet for Healthcare Providers:  IncredibleEmployment.be  This test is no t yet approved or cleared by the [place] FDA and  has been  authorized for detection and/or diagnosis of SARS-CoV-2 by FDA under an Emergency Use Authorization (EUA). This EUA will remain  in effect (meaning this test can be used) for the duration of the COVID-19 declaration under Section 564(b)(1) of the Act, 21 U.S.C.section 360bbb-3(b)(1), unless the authorization is terminated  or revoked sooner.       Influenza A by PCR NEGATIVE NEGATIVE Final   Influenza B by PCR NEGATIVE NEGATIVE Final    Comment: (NOTE) The Xpert Xpress SARS-CoV-2/FLU/RSV plus assay is intended as an aid in the diagnosis of influenza from Nasopharyngeal swab specimens and should not be used as a sole basis for treatment. Nasal washings and aspirates are unacceptable for Xpert Xpress SARS-CoV-2/FLU/RSV testing.  Fact Sheet for Patients: EntrepreneurPulse.com.au  Fact Sheet for Healthcare Providers: IncredibleEmployment.be  This test is not yet approved or cleared by the [place] FDA and has been authorized for detection and/or diagnosis of SARS-CoV-2 by FDA under an Emergency Use Authorization (EUA). This EUA will remain in effect (meaning this test can be used) for the duration of the COVID-19 declaration under Section 564(b)(1) of the Act, 21 U.S.C. section 360bbb-3(b)(1), unless the authorization is terminated or revoked.  Performed at [facility], [place] [address], [place], [place] [address]   Blood culture (routine x 2)     Status: Abnormal   Collection Time: 11/25/20 11:54 AM   Specimen: BLOOD RIGHT ARM  Result Value Ref Range Status   Specimen Description BLOOD RIGHT ARM  Final   Special Requests   Final    BOTTLES DRAWN AEROBIC AND ANAEROBIC Blood Culture adequate volume   Culture  Setup Time   Final    GRAM NEGATIVE RODS IN BOTH AEROBIC AND ANAEROBIC BOTTLES CRITICAL RESULT CALLED TO, READ BACK BY AND VERIFIED  WITH: C AMEND,PHARMD@0647  11/26/20 [place] CORRECTED RESULTS PREVIOUSLY REPORTED AS: GRAM POSITIVE  RODS IN BOTH AEROBIC AND ANAEROBIC BOTTLES CORRECTED RESULTS CALLED TO: Vena [place] ON [id number] AT 0900 BY E.PARRISH Performed at [facility], [place] [address], [place], [place] [address]    Culture PROTEUS MIRABILIS (A)  Final   Report Status 11/27/2020 FINAL  Final   Organism ID, Bacteria PROTEUS MIRABILIS  Final      Susceptibility   Proteus mirabilis - MIC*    AMPICILLIN <=2 SENSITIVE Sensitive     CEFAZOLIN <=4 SENSITIVE Sensitive     CEFEPIME <=0.12 SENSITIVE Sensitive     CEFTAZIDIME <=1 SENSITIVE Sensitive     CEFTRIAXONE <=0.25 SENSITIVE Sensitive     CIPROFLOXACIN <=0.25 SENSITIVE Sensitive     GENTAMICIN <=1 SENSITIVE Sensitive     IMIPENEM 2 SENSITIVE Sensitive     TRIMETH/SULFA <=20 SENSITIVE Sensitive     AMPICILLIN/SULBACTAM <=2 SENSITIVE Sensitive     PIP/TAZO <=4 SENSITIVE Sensitive     * PROTEUS MIRABILIS  Blood Culture ID Panel (Reflexed)     Status: Abnormal   Collection Time: 11/25/20 11:54 AM  Result Value Ref Range Status   Enterococcus faecalis NOT DETECTED NOT DETECTED Final   Enterococcus Faecium NOT DETECTED NOT DETECTED Final   Listeria monocytogenes NOT DETECTED NOT DETECTED Final   Staphylococcus species NOT DETECTED NOT DETECTED Final   Staphylococcus aureus (BCID) NOT DETECTED NOT DETECTED Final   Staphylococcus epidermidis NOT DETECTED NOT DETECTED Final   Staphylococcus lugdunensis NOT DETECTED NOT DETECTED Final   Streptococcus species NOT DETECTED NOT DETECTED Final   Streptococcus agalactiae NOT DETECTED NOT DETECTED Final   Streptococcus pneumoniae NOT DETECTED NOT DETECTED Final   Streptococcus pyogenes NOT DETECTED NOT DETECTED Final   A.calcoaceticus-baumannii NOT DETECTED NOT DETECTED Final   Bacteroides fragilis NOT DETECTED NOT DETECTED Final   Enterobacterales DETECTED (A) NOT DETECTED Final    Comment: Enterobacterales represent a large order of gram negative bacteria, not a single organism. CRITICAL RESULT CALLED TO,  READ BACK BY AND VERIFIED WITH: C AMEND,PHARMD@0649  11/26/20 [place]    Enterobacter cloacae complex NOT DETECTED NOT DETECTED Final   Escherichia coli NOT DETECTED NOT DETECTED Final   Klebsiella aerogenes NOT DETECTED NOT DETECTED Final   Klebsiella oxytoca NOT DETECTED NOT DETECTED Final   Klebsiella pneumoniae NOT DETECTED NOT DETECTED Final   Proteus species DETECTED (A) NOT DETECTED Final    Comment: CRITICAL RESULT CALLED TO, READ BACK BY AND VERIFIED WITH: C AMNED,PHARMD@0649  11/26/20 [place]    Salmonella species NOT DETECTED NOT DETECTED Final   Serratia marcescens NOT DETECTED NOT DETECTED Final   Haemophilus influenzae NOT DETECTED NOT DETECTED Final   Neisseria meningitidis NOT DETECTED NOT DETECTED Final   Pseudomonas aeruginosa NOT DETECTED NOT DETECTED Final   Stenotrophomonas maltophilia NOT DETECTED NOT DETECTED Final   Candida albicans NOT DETECTED NOT DETECTED Final   Candida auris NOT DETECTED NOT DETECTED Final   Candida glabrata NOT DETECTED NOT DETECTED Final   Candida krusei NOT DETECTED NOT DETECTED Final   Candida parapsilosis NOT DETECTED NOT DETECTED Final   Candida tropicalis NOT DETECTED NOT DETECTED Final   Cryptococcus neoformans/gattii NOT DETECTED NOT DETECTED Final   CTX-M ESBL NOT DETECTED NOT DETECTED Final   Carbapenem resistance IMP NOT DETECTED NOT DETECTED Final   Carbapenem resistance KPC NOT DETECTED NOT DETECTED Final   Carbapenem resistance NDM NOT DETECTED NOT DETECTED Final   Carbapenem resist OXA 48 LIKE NOT DETECTED  NOT DETECTED Final   Carbapenem resistance VIM NOT DETECTED NOT DETECTED Final    Comment: Performed at [facility], [place] [address], [place], [place] [address]  Blood culture (routine x 2)     Status: Abnormal   Collection Time: 11/25/20  2:39 PM   Specimen: BLOOD  Result Value Ref Range Status   Specimen Description BLOOD SITE NOT SPECIFIED  Final   Special Requests BOTTLES DRAWN AEROBIC ONLY  Final   Culture  Setup  Time   Final    GRAM NEGATIVE RODS AEROBIC BOTTLE ONLY CRITICAL VALUE NOTED.  VALUE IS CONSISTENT WITH PREVIOUSLY REPORTED AND CALLED VALUE.    Culture (A)  Final    PROTEUS MIRABILIS SUSCEPTIBILITIES PERFORMED ON PREVIOUS CULTURE WITHIN THE LAST 5 DAYS. Performed at [facility], [place] [address], [place], [place] [address]    Report Status 11/27/2020 FINAL  Final  Urine Culture     Status: Abnormal   Collection Time: 11/25/20  3:31 PM   Specimen: Urine, Clean Catch  Result Value Ref Range Status   Specimen Description URINE, CLEAN CATCH  Final   Special Requests   Final    NONE Performed at [facility], [place] [address], [place], [place] [address]    Culture >=100,000 COLONIES/mL KLEBSIELLA PNEUMONIAE (A)  Final   Report Status 11/28/2020 FINAL  Final   Organism ID, Bacteria KLEBSIELLA PNEUMONIAE (A)  Final      Susceptibility   Klebsiella pneumoniae - MIC*    AMPICILLIN >=32 RESISTANT Resistant     CEFAZOLIN <=4 SENSITIVE Sensitive     CEFEPIME <=0.12 SENSITIVE Sensitive     CEFTRIAXONE <=0.25 SENSITIVE Sensitive     CIPROFLOXACIN <=0.25 SENSITIVE Sensitive     GENTAMICIN <=1 SENSITIVE Sensitive     IMIPENEM <=0.25 SENSITIVE Sensitive     NITROFURANTOIN 128 RESISTANT Resistant     TRIMETH/SULFA <=20 SENSITIVE Sensitive     AMPICILLIN/SULBACTAM >=32 RESISTANT Resistant     PIP/TAZO 16 SENSITIVE Sensitive     * >=100,000 COLONIES/mL KLEBSIELLA PNEUMONIAE    RADIOLOGY STUDIES/RESULTS: No results found.   LOS: 4 days   Oren Binet, MD  [facility]    To contact the attending provider between 7A-7P or the covering provider during after hours 7P-7A, please log into the web site [email or web address] and access using universal [place] password for that web site. If you do not have the password, please call the hospital operator.  11/30/2020, 2:16 PM

## 2020-11-30 NOTE — TOC Progression Note (Signed)
Transition of Care [facility]) - Progression Note    Patient Details  Name: Summer Hawkins MRN: [id number] Date of Birth: [birth year]  Transition of [facility]) CM/SW [place], LCSW Phone Number: 11/30/2020, 3:18 PM  Clinical Narrative:    [facility] able to accept patient tomorrow; they have a call in to [facility] to see how many days she has used there. COVID test ordered.    Expected Discharge Plan: [place] Barriers to Discharge: SNF Pending bed offer, Continued Medical Work up  Expected Discharge Plan and Services Expected Discharge Plan: [place] Choice: [place] arrangements for the past 2 months: [place]                                       Social Determinants of Health (SDOH) Interventions    Readmission Risk Interventions No flowsheet data found.

## 2020-11-30 NOTE — Progress Notes (Signed)
Subjective: The patient is alert and pleasant.  She looks and feels better than yesterday.  I encouraged the patient to ambulate.  Objective: Vital signs in last 24 hours: Temp:  [98.1 F (36.7 C)-98.8 F (37.1 C)] 98.1 F (36.7 C) (09/20 0723) Pulse Rate:  [70-71] 70 (09/20 0723) Resp:  [12-25] 25 (09/20 0723) BP: (105-152)/(64-92) 135/82 (09/20 0723) SpO2:  [96 %-98 %] 96 % (09/20 0723) Weight:  [95.3 kg] 95.3 kg (09/20 0404) Estimated body mass index is 36.06 kg/m as calculated from the following:   Height as of this encounter: 5\' 4"  (1.626 m).   Weight as of this encounter: 95.3 kg.   Intake/Output from previous day: 09/19 0701 - 09/20 0700 In: 360 [P.O.:360] Out: 450 [Urine:450] Intake/Output this shift: No intake/output data recorded.  Physical exam the patient is alert and pleasant.  Her lower extreme strength is normal.  Lab Results: Recent Labs    11/28/20 0100 11/30/20 0201  WBC 13.5* 14.9*  HGB 10.2* 10.4*  HCT 31.6* 33.0*  PLT 346 414*   BMET Recent Labs    11/28/20 0100 11/30/20 0201  NA 133* 131*  K 5.0 4.5  CL 101 96*  CO2 21* 21*  GLUCOSE 205* 163*  BUN 37* 26*  CREATININE 1.73* 1.36*  CALCIUM 8.4* 8.8*    Studies/Results: No results found.  Assessment/Plan: Status post lumbar fusion: The patient seems to be slowly progressing.  It looks like she will need rehab or skilled nursing facility placement.  LOS: 4 days     [name] 11/30/2020, 7:29 AM     Patient ID: [name], female   DOB: 11/22/1934, 86 y.o.   MRN: [id number]

## 2020-11-30 NOTE — Progress Notes (Signed)
Physical Therapy Treatment Patient Details Name: Summer Hawkins MRN: [id number] DOB: [birth year] Today's Date: 11/30/2020   History of Present Illness 86 y.o. F admitted on 9/15 due to increased confusion and weakness. Pt with PMH significant for A fib,L3-4, L4-5 fusion on 11/17/20, breast ca, CKD stage III, DM, HTN, pacemaker, lumpectomy, RC repair.    PT Comments    Patient awake, alert for session. Frequently states she cannot do something before even attempting task. Very flat affect. Was able to progress to standing with use of stedy and +2 max assist (using bed pad).     Recommendations for follow up therapy are one component of a multi-disciplinary discharge planning process, led by the attending physician.  Recommendations may be updated based on patient status, additional functional criteria and insurance authorization.  Follow Up Recommendations  SNF;Supervision/Assistance - 24 hour     Equipment Recommendations  Other (comment) (TBA next venue)    Recommendations for Other Services       Precautions / Restrictions Precautions Precautions: Fall;Back Precaution Booklet Issued: No Precaution Comments: Pt had Lumbar fusion 10 days ago. Precautions reviewed, back brace not in room, PA Nundkumar reported that she was ok to move around in the room with no brace. Required Braces or Orthoses: Spinal Brace Spinal Brace: Lumbar corset     Mobility  Bed Mobility Overal bed mobility: Needs Assistance Bed Mobility: Rolling;Sidelying to Sit;Sit to Supine Rolling: Max assist Sidelying to sit: Max assist;+2 for physical assistance     Sit to sidelying: +2 for physical assistance;Mod assist General bed mobility comments: Pt requiring max A to come to sitting, limited by pain    Transfers Overall transfer level: Needs assistance   Transfers: Sit to/from Stand Sit to Stand: From elevated surface;Max assist;+2 physical assistance         General transfer comment: 3 attempts  at standing with bed elevated, bed pad under pelvis to use as sling, with stedy. Each attempt better than the last with pt able to fully stand upright on 3rd try. Stood ~10 seconds  Ambulation/Gait                 Marine scientist Rankin (Stroke Patients Only)       Balance Overall balance assessment: Needs assistance Sitting-balance support: Bilateral upper extremity supported;Feet supported Sitting balance-Leahy Scale: Poor Sitting balance - Comments: Pt requiring cues to lean forward as she leans posteriorly significantly at times. Pt minguard to max assist to sit eOB   Standing balance support: Bilateral upper extremity supported Standing balance-Leahy Scale: Zero                              Cognition Arousal/Alertness: Awake/alert Behavior During Therapy: WFL for tasks assessed/performed Overall Cognitive Status: No family/caregiver present to determine baseline cognitive functioning Area of Impairment: Orientation;Memory;Following commands;Safety/judgement;Awareness;Problem solving                 Orientation Level: Disoriented to;Time   Memory: Decreased recall of precautions Following Commands: Follows one step commands inconsistently Safety/Judgement: Decreased awareness of safety;Decreased awareness of deficits Awareness: Intellectual Problem Solving: Slow processing;Decreased initiation;Difficulty sequencing;Requires verbal cues;Requires tactile cues General Comments: Follows one step commands with increased time. Requiring sequencing cues with multistep tasks.      Exercises      General Comments  Pertinent Vitals/Pain Pain Assessment: Faces Faces Pain Scale: Hurts even more Pain Location: back Pain Descriptors / Indicators: Discomfort;Grimacing;Moaning Pain Intervention(s): Limited activity within patient's tolerance;Monitored during session    Home Living                       Prior Function            PT Goals (current goals can now be found in the care plan section) Acute Rehab PT Goals Patient Stated Goal: To go home per pt even though she needs 24 hour care Time For Goal Achievement: 12/12/20 Potential to Achieve Goals: Poor Progress towards PT goals: Progressing toward goals    Frequency    Min 2X/week      PT Plan Current plan remains appropriate;Frequency needs to be updated    Co-evaluation              AM-PAC PT "6 Clicks" Mobility   Outcome Measure  Help needed turning from your back to your side while in a flat bed without using bedrails?: A Lot Help needed moving from lying on your back to sitting on the side of a flat bed without using bedrails?: Total Help needed moving to and from a bed to a chair (including a wheelchair)?: Total Help needed standing up from a chair using your arms (e.g., wheelchair or bedside chair)?: Total Help needed to walk in hospital room?: Total Help needed climbing 3-5 steps with a railing? : Total 6 Click Score: 7    End of Session Equipment Utilized During Treatment: Other (comment);Gait belt (back brace left at SNF) Activity Tolerance: Patient limited by fatigue;Patient limited by pain Patient left: with call bell/phone within reach;in bed;with bed alarm set Nurse Communication: Mobility status;Need for lift equipment PT Visit Diagnosis: Muscle weakness (generalized) (M62.81);Unsteadiness on feet (R26.81);Other abnormalities of gait and mobility (R26.89);Repeated falls (R29.6);History of falling (Z91.81);Pain Pain - part of body:  (Back)     Time: 1401-1430 PT Time Calculation (min) (ACUTE ONLY): 29 min  Charges:  $Therapeutic Activity: 23-37 mins                      Arby Barrette, PT Pager [phone]    Rexanne Mano 11/30/2020, 3:55 PM

## 2020-11-30 NOTE — Progress Notes (Signed)
[facility] for Infectious Disease  Date of Admission:  11/25/2020           Reason for visit: Follow up on bacteremia  Current antibiotics: Cefazolin 9/18-pres   Previous antibiotics: Meropenem 9/15 Vancomycin 9/15 Ceftriaxone 9/15-9/18 Doxycycline 9/16   ASSESSMENT:    86 y.o. female admitted with:  Proteus mirabilis bacteremia Klebsiella pneumonia positive urine cultures Recent PLIF with neurosurgery 11/17/2020 with postoperative drainage CKD Diabetes  RECOMMENDATIONS:    She has received approximately 5 days of adequate IV antibiotics to treat her bacteremia and suspected UTI.  This is also treating any possible wound infection.   Will go ahead and transition her to oral cephalexin 500 mg 4 times daily.   Plan to give her a longer duration than normal for typical uncomplicated bacteremia with UTI given the possibility of a lumbar wound infection.   Will treat for 2 weeks with end date 12/09/2020.   Monitor for worsening infectious symptoms after antibiotic course that could be concerning for a deeper infection. Will sign off, please call as needed.   Principal Problem:   Gram-negative bacteremia Active Problems:   Chronic atrial fibrillation (HCC)   Breast cancer of upper-outer quadrant of left female breast (HCC)   Chronic diastolic CHF (congestive heart failure) (HCC)   Essential hypertension   Sepsis due to urinary tract infection (HCC)   Acute metabolic encephalopathy   Type 2 diabetes mellitus without complication, with long-term current use of insulin (HCC)   Hypothyroidism   CKD (chronic kidney disease), stage III (HCC)   Sepsis (HCC)   Back pain with history of spinal surgery    MEDICATIONS:    Scheduled Meds:  amLODipine  5 mg Oral Daily   anastrozole  1 mg Oral Daily   apixaban  2.5 mg Oral BID   benzonatate  200 mg Oral TID   docusate sodium  100 mg Oral BID   ferrous sulfate  325 mg Oral Q breakfast   insulin aspart  0-15 Units  Subcutaneous TID WC   insulin aspart  0-5 Units Subcutaneous QHS   insulin detemir  12 Units Subcutaneous QHS   levothyroxine  137 mcg Oral QAC breakfast   metoprolol succinate  25 mg Oral Daily   multivitamin with minerals  1 tablet Oral Daily   senna  2 tablet Oral QHS   sodium chloride flush  3 mL Intravenous Q12H   timolol  1 drop Both Eyes q morning   Continuous Infusions:  sodium chloride 250 mL (11/29/20 1842)    ceFAZolin (ANCEF) IV 2 g (11/30/20 0659)   PRN Meds:.sodium chloride, acetaminophen **OR** acetaminophen, alum & mag hydroxide-simeth, oxyCODONE, polyethylene glycol, sodium chloride flush  SUBJECTIVE:   24 hour events:  Afebrile, T-max 98.8 Continues on cefazolin WBC slightly increased from 2 days prior up to 14.9 No new imaging No new micro No other acute events noted   She reports feeling better today.  She has no fevers.  No abdominal pain.  Back pain improved.  She is alert and oriented x3 and knows that she is in the hospital for bloodstream infection.  Review of Systems  All other systems reviewed and are negative.    OBJECTIVE:   Blood pressure 135/82, pulse 70, temperature 98.1 F (36.7 C), temperature source Oral, resp. rate (!) 25, height 5\' 4"  (1.626 m), weight 95.3 kg, SpO2 96 %. Body mass index is 36.06 kg/m.  Physical Exam Constitutional:      General:  She is not in acute distress.    Appearance: Normal appearance.  HENT:     Head: Normocephalic and atraumatic.  Eyes:     Extraocular Movements: Extraocular movements intact.     Conjunctiva/sclera: Conjunctivae normal.  Pulmonary:     Effort: Pulmonary effort is normal. No respiratory distress.  Abdominal:     General: There is no distension.     Palpations: Abdomen is soft.     Tenderness: There is no abdominal tenderness.  Neurological:     General: No focal deficit present.     Mental Status: She is alert and oriented to person, place, and time.  Psychiatric:        Mood and  Affect: Mood normal.        Behavior: Behavior normal.     Lab Results: Lab Results  Component Value Date   WBC 14.9 (H) 11/30/2020   HGB 10.4 (L) 11/30/2020   HCT 33.0 (L) 11/30/2020   MCV 96.8 11/30/2020   PLT 414 (H) 11/30/2020    Lab Results  Component Value Date   NA 131 (L) 11/30/2020   K 4.5 11/30/2020   CO2 21 (L) 11/30/2020   GLUCOSE 163 (H) 11/30/2020   BUN 26 (H) 11/30/2020   CREATININE 1.36 (H) 11/30/2020   CALCIUM 8.8 (L) 11/30/2020   GFRNONAA 38 (L) 11/30/2020   GFRAA 41 (L) 12/01/2018    Lab Results  Component Value Date   ALT 54 (H) 11/26/2020   AST 53 (H) 11/26/2020   ALKPHOS 123 11/26/2020   BILITOT 1.1 11/26/2020    No results found for: CRP  No results found for: ESRSEDRATE   I have reviewed the micro and lab results in Epic.  Imaging: No results found.   Imaging independently reviewed in Epic.    Raynelle Highland for Infectious Disease [facility] [phone] pager 11/30/2020, 8:55 AM  I spent greater than 35 minutes with the patient including greater than 50% of time in face to face counsel of the patient and in coordination of their care.

## 2020-11-30 NOTE — Progress Notes (Signed)
Inpatient Diabetes Program Recommendations  AACE/ADA: New Consensus Statement on Inpatient Glycemic Control (2015)  Target Ranges:  Prepandial:   less than 140 mg/dL      Peak postprandial:   less than 180 mg/dL (1-2 hours)      Critically ill patients:  140 - 180 mg/dL   Lab Results  Component Value Date   GLUCAP 163 (H) 11/30/2020   HGBA1C 7.0 (H) 11/25/2020    Review of Glycemic Control Results for AZUL, COFFIE (MRN [id number]) as of 11/30/2020 09:50  Ref. Range 11/29/2020 08:16 11/29/2020 12:07 11/29/2020 15:47 11/29/2020 20:26 11/30/2020 07:21  Glucose-Capillary Latest Ref Range: 70 - 99 mg/dL 178 (H) 231 (H) 255 (H) 266 (H) 163 (H)   Diabetes history: DM 2 Outpatient Diabetes medications: metformin 850 bid, Toujeo 15 units qam, 10 units qpm Current orders for Inpatient glycemic control:  Levemir 12 units qhs Novolog 0-15 units tid + hs  Inpatient Diabetes Program Recommendations:    - May consider adding Novolog 3 units tid meal coverage to prevent postprandial spikes  Thanks, Tama Headings RN, MSN, BC-ADM Inpatient Diabetes Coordinator Team Pager [phone] (8a-5p)

## 2020-12-01 DIAGNOSIS — Z87440 Personal history of urinary (tract) infections: Secondary | ICD-10-CM | POA: Diagnosis not present

## 2020-12-01 DIAGNOSIS — E785 Hyperlipidemia, unspecified: Secondary | ICD-10-CM | POA: Diagnosis present

## 2020-12-01 DIAGNOSIS — G8929 Other chronic pain: Secondary | ICD-10-CM | POA: Diagnosis not present

## 2020-12-01 DIAGNOSIS — E669 Obesity, unspecified: Secondary | ICD-10-CM | POA: Diagnosis present

## 2020-12-01 DIAGNOSIS — E119 Type 2 diabetes mellitus without complications: Secondary | ICD-10-CM | POA: Diagnosis not present

## 2020-12-01 DIAGNOSIS — R2681 Unsteadiness on feet: Secondary | ICD-10-CM | POA: Diagnosis not present

## 2020-12-01 DIAGNOSIS — N189 Chronic kidney disease, unspecified: Secondary | ICD-10-CM | POA: Diagnosis present

## 2020-12-01 DIAGNOSIS — D631 Anemia in chronic kidney disease: Secondary | ICD-10-CM | POA: Diagnosis not present

## 2020-12-01 DIAGNOSIS — R7881 Bacteremia: Secondary | ICD-10-CM | POA: Diagnosis not present

## 2020-12-01 DIAGNOSIS — Z20822 Contact with and (suspected) exposure to covid-19: Secondary | ICD-10-CM | POA: Diagnosis present

## 2020-12-01 DIAGNOSIS — N1832 Chronic kidney disease, stage 3b: Secondary | ICD-10-CM | POA: Diagnosis not present

## 2020-12-01 DIAGNOSIS — Z8249 Family history of ischemic heart disease and other diseases of the circulatory system: Secondary | ICD-10-CM | POA: Diagnosis not present

## 2020-12-01 DIAGNOSIS — R5381 Other malaise: Secondary | ICD-10-CM | POA: Diagnosis not present

## 2020-12-01 DIAGNOSIS — Z833 Family history of diabetes mellitus: Secondary | ICD-10-CM | POA: Diagnosis not present

## 2020-12-01 DIAGNOSIS — E11319 Type 2 diabetes mellitus with unspecified diabetic retinopathy without macular edema: Secondary | ICD-10-CM | POA: Diagnosis not present

## 2020-12-01 DIAGNOSIS — I69828 Other speech and language deficits following other cerebrovascular disease: Secondary | ICD-10-CM | POA: Diagnosis not present

## 2020-12-01 DIAGNOSIS — I495 Sick sinus syndrome: Secondary | ICD-10-CM | POA: Diagnosis not present

## 2020-12-01 DIAGNOSIS — Z6833 Body mass index (BMI) 33.0-33.9, adult: Secondary | ICD-10-CM | POA: Diagnosis not present

## 2020-12-01 DIAGNOSIS — T8130XA Disruption of wound, unspecified, initial encounter: Secondary | ICD-10-CM | POA: Diagnosis not present

## 2020-12-01 DIAGNOSIS — T8189XA Other complications of procedures, not elsewhere classified, initial encounter: Secondary | ICD-10-CM | POA: Diagnosis not present

## 2020-12-01 DIAGNOSIS — R410 Disorientation, unspecified: Secondary | ICD-10-CM | POA: Diagnosis not present

## 2020-12-01 DIAGNOSIS — E039 Hypothyroidism, unspecified: Secondary | ICD-10-CM | POA: Diagnosis present

## 2020-12-01 DIAGNOSIS — L304 Erythema intertrigo: Secondary | ICD-10-CM | POA: Diagnosis not present

## 2020-12-01 DIAGNOSIS — I129 Hypertensive chronic kidney disease with stage 1 through stage 4 chronic kidney disease, or unspecified chronic kidney disease: Secondary | ICD-10-CM | POA: Diagnosis present

## 2020-12-01 DIAGNOSIS — R531 Weakness: Secondary | ICD-10-CM | POA: Diagnosis present

## 2020-12-01 DIAGNOSIS — G9341 Metabolic encephalopathy: Secondary | ICD-10-CM | POA: Diagnosis not present

## 2020-12-01 DIAGNOSIS — I482 Chronic atrial fibrillation, unspecified: Secondary | ICD-10-CM | POA: Diagnosis not present

## 2020-12-01 DIAGNOSIS — I5032 Chronic diastolic (congestive) heart failure: Secondary | ICD-10-CM | POA: Diagnosis not present

## 2020-12-01 DIAGNOSIS — R5383 Other fatigue: Secondary | ICD-10-CM | POA: Diagnosis not present

## 2020-12-01 DIAGNOSIS — M6281 Muscle weakness (generalized): Secondary | ICD-10-CM | POA: Diagnosis not present

## 2020-12-01 DIAGNOSIS — B964 Proteus (mirabilis) (morganii) as the cause of diseases classified elsewhere: Secondary | ICD-10-CM | POA: Diagnosis present

## 2020-12-01 DIAGNOSIS — R262 Difficulty in walking, not elsewhere classified: Secondary | ICD-10-CM | POA: Diagnosis present

## 2020-12-01 DIAGNOSIS — I480 Paroxysmal atrial fibrillation: Secondary | ICD-10-CM | POA: Diagnosis not present

## 2020-12-01 DIAGNOSIS — R41841 Cognitive communication deficit: Secondary | ICD-10-CM | POA: Diagnosis not present

## 2020-12-01 DIAGNOSIS — Z803 Family history of malignant neoplasm of breast: Secondary | ICD-10-CM | POA: Diagnosis not present

## 2020-12-01 DIAGNOSIS — Z7401 Bed confinement status: Secondary | ICD-10-CM | POA: Diagnosis not present

## 2020-12-01 DIAGNOSIS — L89152 Pressure ulcer of sacral region, stage 2: Secondary | ICD-10-CM | POA: Diagnosis not present

## 2020-12-01 DIAGNOSIS — T8131XA Disruption of external operation (surgical) wound, not elsewhere classified, initial encounter: Secondary | ICD-10-CM | POA: Diagnosis present

## 2020-12-01 DIAGNOSIS — Z9889 Other specified postprocedural states: Secondary | ICD-10-CM | POA: Diagnosis not present

## 2020-12-01 DIAGNOSIS — Y838 Other surgical procedures as the cause of abnormal reaction of the patient, or of later complication, without mention of misadventure at the time of the procedure: Secondary | ICD-10-CM | POA: Diagnosis present

## 2020-12-01 DIAGNOSIS — Z90710 Acquired absence of both cervix and uterus: Secondary | ICD-10-CM | POA: Diagnosis not present

## 2020-12-01 DIAGNOSIS — Z853 Personal history of malignant neoplasm of breast: Secondary | ICD-10-CM | POA: Diagnosis not present

## 2020-12-01 DIAGNOSIS — Z950 Presence of cardiac pacemaker: Secondary | ICD-10-CM | POA: Diagnosis not present

## 2020-12-01 DIAGNOSIS — T8141XA Infection following a procedure, superficial incisional surgical site, initial encounter: Secondary | ICD-10-CM | POA: Diagnosis present

## 2020-12-01 DIAGNOSIS — E1122 Type 2 diabetes mellitus with diabetic chronic kidney disease: Secondary | ICD-10-CM | POA: Diagnosis present

## 2020-12-01 DIAGNOSIS — T8463XA Infection and inflammatory reaction due to internal fixation device of spine, initial encounter: Secondary | ICD-10-CM | POA: Diagnosis not present

## 2020-12-01 DIAGNOSIS — N390 Urinary tract infection, site not specified: Secondary | ICD-10-CM | POA: Diagnosis not present

## 2020-12-01 DIAGNOSIS — Z23 Encounter for immunization: Secondary | ICD-10-CM | POA: Diagnosis not present

## 2020-12-01 DIAGNOSIS — A419 Sepsis, unspecified organism: Secondary | ICD-10-CM | POA: Diagnosis not present

## 2020-12-01 LAB — GLUCOSE, CAPILLARY
Glucose-Capillary: 171 mg/dL — ABNORMAL HIGH (ref 70–99)
Glucose-Capillary: 200 mg/dL — ABNORMAL HIGH (ref 70–99)
Glucose-Capillary: 206 mg/dL — ABNORMAL HIGH (ref 70–99)

## 2020-12-01 MED ORDER — CEPHALEXIN 500 MG PO CAPS: 500.0000 mg | ORAL_CAPSULE | Freq: Four times a day (QID) | ORAL | 0 refills | Status: AC

## 2020-12-01 MED ORDER — INSULIN DETEMIR 100 UNIT/ML ~~LOC~~ SOLN: 12.0000 [IU] | Freq: Every day | SUBCUTANEOUS | 11 refills | Status: DC

## 2020-12-01 MED ORDER — INSULIN ASPART 100 UNIT/ML IJ SOLN: INTRAMUSCULAR | 11 refills | Status: DC

## 2020-12-01 MED ORDER — INFLUENZA VAC A&B SA ADJ QUAD 0.5 ML IM PRSY: 0.5000 mL | PREFILLED_SYRINGE | INTRAMUSCULAR | Status: DC

## 2020-12-01 MED ORDER — APIXABAN 2.5 MG PO TABS: 2.5000 mg | ORAL_TABLET | Freq: Two times a day (BID) | ORAL | Status: DC

## 2020-12-01 MED ORDER — OXYCODONE HCL 10 MG PO TABS: 10.0000 mg | ORAL_TABLET | Freq: Four times a day (QID) | ORAL | 0 refills | Status: DC | PRN

## 2020-12-01 MED ORDER — INFLUENZA VAC A&B SA ADJ QUAD 0.5 ML IM PRSY
0.5000 mL | PREFILLED_SYRINGE | INTRAMUSCULAR | Status: AC
  Administered 2020-12-01: 0.5 mL via INTRAMUSCULAR
  Filled 2020-12-01: qty 0.5

## 2020-12-01 NOTE — Discharge Summary (Signed)
PATIENT DETAILS Name: Summer Hawkins Age: 86 y.o. Sex: female Date of Birth: 07-04-1934 MRN: [id number]. Admitting Physician: Edwin Dada, MD [id number], Fransico Him, MD  Admit Date: 11/25/2020 Discharge date: 12/01/2020  Recommendations for Outpatient Follow-up:  Follow up with PCP in 1-2 weeks Please obtain CMP/CBC in one week End date for antibiotic 9/29-please monitor closely for fever/worsening leukocytosis-May need imaging of the spine if signs of worsening infectious symptoms.  Admitted From:  SNF  Disposition: SNF   Home Health: No  Equipment/Devices: None  Discharge Condition: Stable  CODE STATUS:  DNR  Diet recommendation:  Diet Order             Diet - low sodium heart healthy           Diet Carb Modified           Diet Carb Modified Fluid consistency: Thin; Room service appropriate? Yes  Diet effective now                    Brief Summary: Patient is a 86 y.o. female who went decompressive two-level lumbar spine surgery on 9/7-presented from SNF with fever/confusion-she was subsequently found to have severe sepsis due to Proteus bacteremia.    Brief Hospital Course: Severe sepsis due to Proteus bacteremia/Klebsiella UTI: Sepsis physiology has resolved.  Due to discordant organisms and blood in urine-some concern that bacteremia could have originated from her back wound-however per neurosurgery-no evidence of wound infection.  Clinically does not appear to have a deep tissue infection as well.  Discussed with ID MD-plans are to transition to oral cephalexin-total of 2 weeks of antibiotics with end date of 9/29.  Will need to be watched to see if she develops any signs of postoperative back infection.   Acute metabolic encephalopathy: Due to sepsis-resolved.  CT head negative.  She is awake and alert this morning-however has had some issues with mild delirium (on oxycodone for pain)    Recent lumbar spine surgery: No obvious wound  infection per neurosurgery-see above concerns.   Hyperkalemia: Resolved.   PAF: Rate controlled-continue metoprolol and Eliquis.   Sick sinus syndrome-s/p PPM implantation: Continue telemetry monitoring.   Chronic diastolic heart failure: Euvolemic on exam.   CKD stage IIIb: Creatinine close to baseline   Normocytic anemia: Hemoglobin stable-follow periodically.   HTN: BP controlled-continue amlodipine and metoprolol.   DM-2 (A1c 7.09/15): CBGs stable-continue Levemir 12 units and SSI.  Resume metformin on discharge as well.  Hypothyroidism: Continue Synthroid.  TSH is stable.   History of breast cancer: Continue Arimidex-stable for outpatient follow-up with her primary oncologist.   Obesity: Estimated body mass index is 36.06 kg/m as calculated from the following:   Height as of this encounter: 5\' 4"  (1.626 m).   Weight as of this encounter: 95.3 kg.    RN pressure injury documentation: Pressure Injury 11/29/20 Coccyx Stage 2 -  Partial thickness loss of dermis presenting as a shallow open injury with a red, pink wound bed without slough. (Active)  11/29/20 1000  Location: Coccyx  Location Orientation:   Staging: Stage 2 -  Partial thickness loss of dermis presenting as a shallow open injury with a red, pink wound bed without slough.  Wound Description (Comments):   Present on Admission:     Procedures None  Discharge Diagnoses:  Principal Problem:   Gram-negative bacteremia Active Problems:   Chronic atrial fibrillation (HCC)   Breast cancer of upper-outer quadrant of left female breast ([place])  Chronic diastolic CHF (congestive heart failure) (HCC)   Essential hypertension   Sepsis due to urinary tract infection ([place])   Acute metabolic encephalopathy   Type 2 diabetes mellitus without complication, with long-term current use of insulin (HCC)   Hypothyroidism   CKD (chronic kidney disease), stage III (HCC)   Sepsis (HCC)   Back pain with history of spinal  surgery   Discharge Instructions:  Activity:  As tolerated   Discharge Instructions     Call MD for:  redness, tenderness, or signs of infection (pain, swelling, redness, odor or green/yellow discharge around incision site)   Complete by: As directed    Call MD for:  temperature >100.4   Complete by: As directed    Diet - low sodium heart healthy   Complete by: As directed    Diet Carb Modified   Complete by: As directed    Discharge instructions   Complete by: As directed    Follow with Primary MD  Tisovec, Fransico Him, MD in 1-2 weeks  Please get a complete blood count and chemistry panel checked by your Primary MD at your next visit, and again as instructed by your Primary MD.  Get Medicines reviewed and adjusted: Please take all your medications with you for your next visit with your Primary MD  Laboratory/radiological data: Please request your Primary MD to go over all hospital tests and procedure/radiological results at the follow up, please ask your Primary MD to get all Hospital records sent to his/her office.  In some cases, they will be blood work, cultures and biopsy results pending at the time of your discharge. Please request that your primary care M.D. follows up on these results.  Also Note the following: If you experience worsening of your admission symptoms, develop shortness of breath, life threatening emergency, suicidal or homicidal thoughts you must seek medical attention immediately by calling 911 or calling your MD immediately  if symptoms less severe.  You must read complete instructions/literature along with all the possible adverse reactions/side effects for all the Medicines you take and that have been prescribed to you. Take any new Medicines after you have completely understood and accpet all the possible adverse reactions/side effects.   Do not drive when taking Pain medications or sleeping medications (Benzodaizepines)  Do not take more than  prescribed Pain, Sleep and Anxiety Medications. It is not advisable to combine anxiety,sleep and pain medications without talking with your primary care practitioner  Special Instructions: If you have smoked or chewed Tobacco  in the last 2 yrs please stop smoking, stop any regular Alcohol  and or any Recreational drug use.  Wear Seat belts while driving.  Please note: You were cared for by a hospitalist during your hospital stay. Once you are discharged, your primary care physician will handle any further medical issues. Please note that NO REFILLS for any discharge medications will be authorized once you are discharged, as it is imperative that you return to your primary care physician (or establish a relationship with a primary care physician if you do not have one) for your post hospital discharge needs so that they can reassess your need for medications and monitor your lab values.   CBGs before meals and at bedtime   Increase activity slowly   Complete by: As directed    No wound care   Complete by: As directed       Allergies as of 12/01/2020       Reactions   Bee  Venom Shortness Of Breath, Nausea And Vomiting, Other (See Comments)   Makes the patient feel faint, also   Penicillins Anaphylaxis, Hives, Swelling, Other (See Comments)   Has patient had a PCN reaction causing immediate rash, facial/tongue/throat swelling, SOB or lightheadedness with hypotension: Yes Has patient had a PCN reaction causing severe rash involving mucus membranes or skin necrosis: No Has patient had a PCN reaction that required hospitalization: No Has patient had a PCN reaction occurring within the last 10 years: No If all of the above answers are "NO", then may proceed with Cephalosporin use.   Liraglutide    Other reaction(s): Rash at injection site   Lisinopril    Other reaction(s): cough   Zocor [simvastatin]    Other reaction(s): memory changes        Medication List     STOP taking these  medications    doxycycline 100 MG tablet Commonly known as: VIBRA-TABS   irbesartan 300 MG tablet Commonly known as: AVAPRO   Toujeo SoloStar 300 UNIT/ML Solostar Pen Generic drug: insulin glargine (1 Unit Dial)   traMADol 50 MG tablet Commonly known as: ULTRAM       TAKE these medications    acetaminophen 500 MG tablet Commonly known as: TYLENOL Take 1,000 mg by mouth every 8 (eight) hours.   amLODipine 5 MG tablet Commonly known as: NORVASC Take 5 mg by mouth daily.   anastrozole 1 MG tablet Commonly known as: ARIMIDEX Take 1 tablet (1 mg total) by mouth daily.   apixaban 2.5 MG Tabs tablet Commonly known as: Eliquis Take 1 tablet (2.5 mg total) by mouth 2 (two) times daily. What changed:  medication strength how much to take   cephALEXin 500 MG capsule Commonly known as: KEFLEX Take 1 capsule (500 mg total) by mouth every 6 (six) hours for 8 days.   cyclobenzaprine 10 MG tablet Commonly known as: FLEXERIL Take 1 tablet (10 mg total) by mouth 3 (three) times daily as needed for muscle spasms.   diphenhydramine-acetaminophen 25-500 MG Tabs tablet Commonly known as: TYLENOL PM Take 1 tablet by mouth at bedtime as needed (for sleep).   docusate sodium 100 MG capsule Commonly known as: COLACE Take 1 capsule (100 mg total) by mouth 2 (two) times daily.   ferrous sulfate 325 (65 FE) MG tablet Take 325 mg by mouth daily with breakfast.   insulin aspart 100 UNIT/ML injection Commonly known as: novoLOG 0-15 Units, Subcutaneous, 3 times daily with meals, CBG < 70: Implement Hypoglycemia measures CBG 70 - 120: 0 units CBG 121 - 150: 2 units CBG 151 - 200: 3 units CBG 201 - 250: 5 units CBG 251 - 300: 8 units CBG 301 - 350: 11 units CBG 351 - 400: 15 units CBG > 400: call MD   insulin detemir 100 UNIT/ML injection Commonly known as: LEVEMIR Inject 0.12 mLs (12 Units total) into the skin at bedtime.   levothyroxine 137 MCG tablet Commonly known as:  SYNTHROID Take 137 mcg by mouth daily before breakfast.   metFORMIN 850 MG tablet Commonly known as: GLUCOPHAGE Take 850 mg by mouth 2 (two) times daily with a meal.   metoprolol succinate 25 MG 24 hr tablet Commonly known as: Toprol XL Take 1 tablet (25 mg total) by mouth daily.   multivitamin with minerals tablet Take 1 tablet by mouth daily.   Oxycodone HCl 10 MG Tabs Take 1 tablet (10 mg total) by mouth every 6 (six) hours as needed ((score 7 to  10)). What changed: reasons to take this   polyethylene glycol 17 g packet Commonly known as: MIRALAX / GLYCOLAX Take 17 g by mouth daily as needed for mild constipation.   senna 8.6 MG Tabs tablet Commonly known as: SENOKOT Take 2 tablets by mouth at bedtime.   spironolactone 25 MG tablet Commonly known as: ALDACTONE Take 25 mg by mouth daily.   timolol 0.5 % ophthalmic solution Commonly known as: TIMOPTIC Place 1 drop into both eyes every morning.        Follow-up Information     Tisovec, Fransico Him, MD. Schedule an appointment as soon as possible for a visit in 1 week(s).   Specialty: Internal Medicine Contact information: [place] [address] [phone]         Burnell Blanks, MD Follow up in 1 month(s).   Specialty: Cardiology Contact information: [place] [address]  [address] [phone]         Evans Lance, MD Follow up in 1 month(s).   Specialty: Cardiology Contact information: [address] [place] [address] [phone]         Newman Pies, MD. Schedule an appointment as soon as possible for a visit in 2 week(s).   Specialty: Neurosurgery Contact information: [address] [place] [address] [phone]                Allergies  Allergen Reactions   Bee Venom Shortness Of Breath, Nausea And Vomiting and Other (See Comments)    Makes the patient feel faint, also   Penicillins  Anaphylaxis, Hives, Swelling and Other (See Comments)    Has patient had a PCN reaction causing immediate rash, facial/tongue/throat swelling, SOB or lightheadedness with hypotension: Yes Has patient had a PCN reaction causing severe rash involving mucus membranes or skin necrosis: No Has patient had a PCN reaction that required hospitalization: No Has patient had a PCN reaction occurring within the last 10 years: No If all of the above answers are "NO", then may proceed with Cephalosporin use.    Liraglutide     Other reaction(s): Rash at injection site   Lisinopril     Other reaction(s): cough   Zocor [Simvastatin]     Other reaction(s): memory changes      Consultations: ID, Neurosurgery   Other Procedures/Studies: DG Lumbar Spine 2-3 Views  Result Date: 11/17/2020 CLINICAL DATA:  Posterior lumbar interbody fusion. EXAM: LUMBAR SPINE - 2-3 VIEW COMPARISON:  Radiograph 10/28/2020 confirming 5 lumbar type vertebra. FINDINGS: Two fluoroscopic spot views obtained in the operating room. Intrapedicular screws at L3 through L5 with interbody spacers in place. Fluoroscopy time 17 seconds. Dose 14.06 mGy. IMPRESSION: Fluoroscopic spot views during L3-L5 fusion. Electronically Signed   By: Keith Rake M.D.   On: 11/17/2020 19:15   CT HEAD WO CONTRAST (5MM)  Result Date: 11/25/2020 CLINICAL DATA:  Altered mental status. EXAM: CT HEAD WITHOUT CONTRAST TECHNIQUE: Contiguous axial images were obtained from the base of the skull through the vertex without intravenous contrast. COMPARISON:  November 15, 2018. FINDINGS: Brain: Mild chronic ischemic white matter disease is noted. No mass effect or midline shift is noted. Ventricular size is within normal limits. There is no evidence of mass lesion, hemorrhage or acute infarction. Vascular: No hyperdense vessel or unexpected calcification. Skull: Normal. Negative for fracture or focal lesion. Sinuses/Orbits: No acute finding. Other: None. IMPRESSION:  No acute intracranial abnormality seen. Electronically Signed  By: Marijo Conception M.D.   On: 11/25/2020 12:55   DG Lumbar Spine 1 View  Result Date: 11/17/2020 CLINICAL DATA:  Elective surgery, obtained in the operating room. EXAM: LUMBAR SPINE - 1 VIEW COMPARISON:  10/28/2020 confirming 5 lumbar type vertebra. FINDINGS: Cross-table lateral view of the lumbar spine obtained in the operating room. Surgical instruments localize posterior to the L4-L5 disc space projecting over the posterior elements. IMPRESSION: Surgical instruments posterior to the L4-L5 disc space. Electronically Signed   By: Keith Rake M.D.   On: 11/17/2020 19:14   DG Chest Port 1 View  Result Date: 11/25/2020 CLINICAL DATA:  Weakness EXAM: PORTABLE CHEST 1 VIEW COMPARISON:  12/03/2018 FINDINGS: Left pacer remains in place, unchanged. Cardiomegaly. No confluent opacities, effusions or edema. No acute bony abnormality. IMPRESSION: No active disease. Electronically Signed   By: Rolm Baptise M.D.   On: 11/25/2020 12:04   DG C-Arm 1-60 Min-No Report  Result Date: 11/17/2020 Fluoroscopy was utilized by the requesting physician.  No radiographic interpretation.     TODAY-DAY OF DISCHARGE:  Subjective:   Summer Hawkins today has no headache,no chest abdominal pain,no new weakness tingling or numbness, feels much better wants to go home today.  Objective:   Blood pressure (!) 148/62, pulse 69, temperature 98.1 F (36.7 C), temperature source Oral, resp. rate 17, height 5\' 4"  (1.626 m), weight 95.3 kg, SpO2 97 %.  Intake/Output Summary (Last 24 hours) at 12/01/2020 0949 Last data filed at 12/01/2020 0900 Gross per 24 hour  Intake 240 ml  Output 1200 ml  Net -960 ml   Filed Weights   11/28/20 0329 11/29/20 0500 11/30/20 0404  Weight: 93.2 kg 95.4 kg 95.3 kg    Exam: Awake Alert, Oriented *3, No new F.N deficits, Normal affect [place].AT,PERRAL Supple Neck,No JVD, No cervical lymphadenopathy appriciated.  Symmetrical  Chest wall movement, Good air movement bilaterally, CTAB RRR,No Gallops,Rubs or new Murmurs, No Parasternal Heave +ve B.Sounds, Abd Soft, Non tender, No organomegaly appriciated, No rebound -guarding or rigidity. No Cyanosis, Clubbing or edema, No new Rash or bruise   PERTINENT RADIOLOGIC STUDIES: No results found.   PERTINENT LAB RESULTS: CBC: Recent Labs    11/30/20 0201  WBC 14.9*  HGB 10.4*  HCT 33.0*  PLT 414*   CMET CMP     Component Value Date/Time   NA 131 (L) 11/30/2020 0201   NA 140 05/15/2018 1106   NA 138 01/06/2016 1533   K 4.5 11/30/2020 0201   K 4.2 01/06/2016 1533   CL 96 (L) 11/30/2020 0201   CO2 21 (L) 11/30/2020 0201   CO2 24 01/06/2016 1533   GLUCOSE 163 (H) 11/30/2020 0201   GLUCOSE 234 (H) 01/06/2016 1533   BUN 26 (H) 11/30/2020 0201   BUN 26 05/15/2018 1106   BUN 16.8 01/06/2016 1533   CREATININE 1.36 (H) 11/30/2020 0201   CREATININE 0.9 01/06/2016 1533   CALCIUM 8.8 (L) 11/30/2020 0201   CALCIUM 9.1 01/06/2016 1533   PROT 5.9 (L) 11/26/2020 0626   PROT 7.3 01/06/2016 1533   ALBUMIN 2.5 (L) 11/26/2020 0626   ALBUMIN 3.6 01/06/2016 1533   AST 53 (H) 11/26/2020 0626   AST 21 01/06/2016 1533   ALT 54 (H) 11/26/2020 0626   ALT 26 01/06/2016 1533   ALKPHOS 123 11/26/2020 0626   ALKPHOS 103 01/06/2016 1533   BILITOT 1.1 11/26/2020 0626   BILITOT 0.50 01/06/2016 1533   GFRNONAA 38 (L) 11/30/2020 0201   GFRAA 41 (L)  12/01/2018 0555    GFR Estimated Creatinine Clearance: 34.5 mL/min (A) (by C-G formula based on SCr of 1.36 mg/dL (H)). No results for input(s): LIPASE, AMYLASE in the last 72 hours. No results for input(s): CKTOTAL, CKMB, CKMBINDEX, TROPONINI in the last 72 hours. Invalid input(s): POCBNP No results for input(s): DDIMER in the last 72 hours. No results for input(s): HGBA1C in the last 72 hours. No results for input(s): CHOL, HDL, LDLCALC, TRIG, CHOLHDL, LDLDIRECT in the last 72 hours. No results for input(s): TSH, T4TOTAL,  T3FREE, THYROIDAB in the last 72 hours.  Invalid input(s): FREET3 No results for input(s): VITAMINB12, FOLATE, FERRITIN, TIBC, IRON, RETICCTPCT in the last 72 hours. Coags: No results for input(s): INR in the last 72 hours.  Invalid input(s): PT Microbiology: Recent Results (from the past 240 hour(s))  SARS CORONAVIRUS 2 (TAT 6-24 HRS) Nasopharyngeal Nasopharyngeal Swab     Status: None   Collection Time: 11/21/20 11:49 AM   Specimen: Nasopharyngeal Swab  Result Value Ref Range Status   SARS Coronavirus 2 NEGATIVE NEGATIVE Final    Comment: (NOTE) SARS-CoV-2 target nucleic acids are NOT DETECTED.  The SARS-CoV-2 RNA is generally detectable in upper and lower respiratory specimens during the acute phase of infection. Negative results do not preclude SARS-CoV-2 infection, do not rule out co-infections with other pathogens, and should not be used as the sole basis for treatment or other patient management decisions. Negative results must be combined with clinical observations, patient history, and epidemiological information. The expected result is Negative.  Fact Sheet for Patients: SugarRoll.be  Fact Sheet for Healthcare Providers: https://www.woods-mathews.com/  This test is not yet approved or cleared by the [place] FDA and  has been authorized for detection and/or diagnosis of SARS-CoV-2 by FDA under an Emergency Use Authorization (EUA). This EUA will remain  in effect (meaning this test can be used) for the duration of the COVID-19 declaration under Se ction 564(b)(1) of the Act, 21 U.S.C. section 360bbb-3(b)(1), unless the authorization is terminated or revoked sooner.  Performed at [facility], [place] [address], [place], [place] [address]   Resp Panel by RT-PCR (Flu A&B, Covid) Nasopharyngeal Swab     Status: None   Collection Time: 11/25/20 11:22 AM   Specimen: Nasopharyngeal Swab; Nasopharyngeal(NP) swabs in vial  transport medium  Result Value Ref Range Status   SARS Coronavirus 2 by RT PCR NEGATIVE NEGATIVE Final    Comment: (NOTE) SARS-CoV-2 target nucleic acids are NOT DETECTED.  The SARS-CoV-2 RNA is generally detectable in upper respiratory specimens during the acute phase of infection. The lowest concentration of SARS-CoV-2 viral copies this assay can detect is 138 copies/mL. A negative result does not preclude SARS-Cov-2 infection and should not be used as the sole basis for treatment or other patient management decisions. A negative result may occur with  improper specimen collection/handling, submission of specimen other than nasopharyngeal swab, presence of viral mutation(s) within the areas targeted by this assay, and inadequate number of viral copies(<138 copies/mL). A negative result must be combined with clinical observations, patient history, and epidemiological information. The expected result is Negative.  Fact Sheet for Patients:  EntrepreneurPulse.com.au  Fact Sheet for Healthcare Providers:  IncredibleEmployment.be  This test is no t yet approved or cleared by the [place] FDA and  has been authorized for detection and/or diagnosis of SARS-CoV-2 by FDA under an Emergency Use Authorization (EUA). This EUA will remain  in effect (meaning this test can be used) for the duration  of the COVID-19 declaration under Section 564(b)(1) of the Act, 21 U.S.C.section 360bbb-3(b)(1), unless the authorization is terminated  or revoked sooner.       Influenza A by PCR NEGATIVE NEGATIVE Final   Influenza B by PCR NEGATIVE NEGATIVE Final    Comment: (NOTE) The Xpert Xpress SARS-CoV-2/FLU/RSV plus assay is intended as an aid in the diagnosis of influenza from Nasopharyngeal swab specimens and should not be used as a sole basis for treatment. Nasal washings and aspirates are unacceptable for Xpert Xpress SARS-CoV-2/FLU/RSV testing.  Fact  Sheet for Patients: EntrepreneurPulse.com.au  Fact Sheet for Healthcare Providers: IncredibleEmployment.be  This test is not yet approved or cleared by the [place] FDA and has been authorized for detection and/or diagnosis of SARS-CoV-2 by FDA under an Emergency Use Authorization (EUA). This EUA will remain in effect (meaning this test can be used) for the duration of the COVID-19 declaration under Section 564(b)(1) of the Act, 21 U.S.C. section 360bbb-3(b)(1), unless the authorization is terminated or revoked.  Performed at [facility], [place] [address], [place], [place] [address]   Blood culture (routine x 2)     Status: Abnormal   Collection Time: 11/25/20 11:54 AM   Specimen: BLOOD RIGHT ARM  Result Value Ref Range Status   Specimen Description BLOOD RIGHT ARM  Final   Special Requests   Final    BOTTLES DRAWN AEROBIC AND ANAEROBIC Blood Culture adequate volume   Culture  Setup Time   Final    GRAM NEGATIVE RODS IN BOTH AEROBIC AND ANAEROBIC BOTTLES CRITICAL RESULT CALLED TO, READ BACK BY AND VERIFIED WITH: C AMEND,PHARMD@0647  11/26/20 [place] CORRECTED RESULTS PREVIOUSLY REPORTED AS: GRAM POSITIVE RODS IN BOTH AEROBIC AND ANAEROBIC BOTTLES CORRECTED RESULTS CALLED TO: Vena [place] ON [id number] AT 0900 BY E.PARRISH Performed at [facility], [place] [address] [place], [place] [address]    Culture PROTEUS MIRABILIS (A)  Final   Report Status 11/27/2020 FINAL  Final   Organism ID, Bacteria PROTEUS MIRABILIS  Final      Susceptibility   Proteus mirabilis - MIC*    AMPICILLIN <=2 SENSITIVE Sensitive     CEFAZOLIN <=4 SENSITIVE Sensitive     CEFEPIME <=0.12 SENSITIVE Sensitive     CEFTAZIDIME <=1 SENSITIVE Sensitive     CEFTRIAXONE <=0.25 SENSITIVE Sensitive     CIPROFLOXACIN <=0.25 SENSITIVE Sensitive     GENTAMICIN <=1 SENSITIVE Sensitive     IMIPENEM 2 SENSITIVE Sensitive     TRIMETH/SULFA <=20 SENSITIVE Sensitive      AMPICILLIN/SULBACTAM <=2 SENSITIVE Sensitive     PIP/TAZO <=4 SENSITIVE Sensitive     * PROTEUS MIRABILIS  Blood Culture ID Panel (Reflexed)     Status: Abnormal   Collection Time: 11/25/20 11:54 AM  Result Value Ref Range Status   Enterococcus faecalis NOT DETECTED NOT DETECTED Final   Enterococcus Faecium NOT DETECTED NOT DETECTED Final   Listeria monocytogenes NOT DETECTED NOT DETECTED Final   Staphylococcus species NOT DETECTED NOT DETECTED Final   Staphylococcus aureus (BCID) NOT DETECTED NOT DETECTED Final   Staphylococcus epidermidis NOT DETECTED NOT DETECTED Final   Staphylococcus lugdunensis NOT DETECTED NOT DETECTED Final   Streptococcus species NOT DETECTED NOT DETECTED Final   Streptococcus agalactiae NOT DETECTED NOT DETECTED Final   Streptococcus pneumoniae NOT DETECTED NOT DETECTED Final   Streptococcus pyogenes NOT DETECTED NOT DETECTED Final   A.calcoaceticus-baumannii NOT DETECTED NOT DETECTED Final   Bacteroides fragilis NOT DETECTED NOT DETECTED Final   Enterobacterales  DETECTED (A) NOT DETECTED Final    Comment: Enterobacterales represent a large order of gram negative bacteria, not a single organism. CRITICAL RESULT CALLED TO, READ BACK BY AND VERIFIED WITH: C AMEND,PHARMD@[email or web address]  11/26/20 [place]    Enterobacter cloacae complex NOT DETECTED NOT DETECTED Final   Escherichia coli NOT DETECTED NOT DETECTED Final   Klebsiella aerogenes NOT DETECTED NOT DETECTED Final   Klebsiella oxytoca NOT DETECTED NOT DETECTED Final   Klebsiella pneumoniae NOT DETECTED NOT DETECTED Final   Proteus species DETECTED (A) NOT DETECTED Final    Comment: CRITICAL RESULT CALLED TO, READ BACK BY AND VERIFIED WITH: C AMNED,[id number]  11/26/20 [place]    Salmonella species NOT DETECTED NOT DETECTED Final   Serratia marcescens NOT DETECTED NOT DETECTED Final   Haemophilus influenzae NOT DETECTED NOT DETECTED Final   Neisseria meningitidis NOT DETECTED NOT DETECTED Final   Pseudomonas aeruginosa  NOT DETECTED NOT DETECTED Final   Stenotrophomonas maltophilia NOT DETECTED NOT DETECTED Final   Candida albicans NOT DETECTED NOT DETECTED Final   Candida auris NOT DETECTED NOT DETECTED Final   Candida glabrata NOT DETECTED NOT DETECTED Final   Candida krusei NOT DETECTED NOT DETECTED Final   Candida parapsilosis NOT DETECTED NOT DETECTED Final   Candida tropicalis NOT DETECTED NOT DETECTED Final   Cryptococcus neoformans/gattii NOT DETECTED NOT DETECTED Final   CTX-M ESBL NOT DETECTED NOT DETECTED Final   Carbapenem resistance IMP NOT DETECTED NOT DETECTED Final   Carbapenem resistance KPC NOT DETECTED NOT DETECTED Final   Carbapenem resistance NDM NOT DETECTED NOT DETECTED Final   Carbapenem resist OXA 48 LIKE NOT DETECTED NOT DETECTED Final   Carbapenem resistance VIM NOT DETECTED NOT DETECTED Final    Comment: Performed at [facility], [address], [place], [place] [address]  Blood culture (routine x 2)     Status: Abnormal   Collection Time: 11/25/20  2:39 PM   Specimen: BLOOD  Result Value Ref Range Status   Specimen Description BLOOD SITE NOT SPECIFIED  Final   Special Requests BOTTLES DRAWN AEROBIC ONLY  Final   Culture  Setup Time   Final    GRAM NEGATIVE RODS AEROBIC BOTTLE ONLY CRITICAL VALUE NOTED.  VALUE IS CONSISTENT WITH PREVIOUSLY REPORTED AND CALLED VALUE.    Culture (A)  Final    PROTEUS MIRABILIS SUSCEPTIBILITIES PERFORMED ON PREVIOUS CULTURE WITHIN THE LAST 5 DAYS. Performed at [facility], [place] [address], [place], [place] [address]    Report Status 11/27/2020 FINAL  Final  Urine Culture     Status: Abnormal   Collection Time: 11/25/20  3:31 PM   Specimen: Urine, Clean Catch  Result Value Ref Range Status   Specimen Description URINE, CLEAN CATCH  Final   Special Requests   Final    NONE Performed at [facility], [place] [address] [place],  [address]    Culture >=100,000 COLONIES/mL KLEBSIELLA PNEUMONIAE (A)  Final   Report  Status 11/28/2020 FINAL  Final   Organism ID, Bacteria KLEBSIELLA PNEUMONIAE (A)  Final      Susceptibility   Klebsiella pneumoniae - MIC*    AMPICILLIN >=32 RESISTANT Resistant     CEFAZOLIN <=4 SENSITIVE Sensitive     CEFEPIME <=0.12 SENSITIVE Sensitive     CEFTRIAXONE <=0.25 SENSITIVE Sensitive     CIPROFLOXACIN <=0.25 SENSITIVE Sensitive     GENTAMICIN <=1 SENSITIVE Sensitive     IMIPENEM <=0.25 SENSITIVE Sensitive     NITROFURANTOIN 128 RESISTANT Resistant  TRIMETH/SULFA <=20 SENSITIVE Sensitive     AMPICILLIN/SULBACTAM >=32 RESISTANT Resistant     PIP/TAZO 16 SENSITIVE Sensitive     * >=100,000 COLONIES/mL KLEBSIELLA PNEUMONIAE  Resp Panel by RT-PCR (Flu A&B, Covid) Nasopharyngeal Swab     Status: None   Collection Time: 11/30/20  1:08 PM   Specimen: Nasopharyngeal Swab; Nasopharyngeal(NP) swabs in vial transport medium  Result Value Ref Range Status   SARS Coronavirus 2 by RT PCR NEGATIVE NEGATIVE Final    Comment: (NOTE) SARS-CoV-2 target nucleic acids are NOT DETECTED.  The SARS-CoV-2 RNA is generally detectable in upper respiratory specimens during the acute phase of infection. The lowest concentration of SARS-CoV-2 viral copies this assay can detect is 138 copies/mL. A negative result does not preclude SARS-Cov-2 infection and should not be used as the sole basis for treatment or other patient management decisions. A negative result may occur with  improper specimen collection/handling, submission of specimen other than nasopharyngeal swab, presence of viral mutation(s) within the areas targeted by this assay, and inadequate number of viral copies(<138 copies/mL). A negative result must be combined with clinical observations, patient history, and epidemiological information. The expected result is Negative.  Fact Sheet for Patients:  EntrepreneurPulse.com.au  Fact Sheet for Healthcare Providers:  IncredibleEmployment.be  This  test is no t yet approved or cleared by the [place] FDA and  has been authorized for detection and/or diagnosis of SARS-CoV-2 by FDA under an Emergency Use Authorization (EUA). This EUA will remain  in effect (meaning this test can be used) for the duration of the COVID-19 declaration under Section 564(b)(1) of the Act, 21 U.S.C.section 360bbb-3(b)(1), unless the authorization is terminated  or revoked sooner.       Influenza A by PCR NEGATIVE NEGATIVE Final   Influenza B by PCR NEGATIVE NEGATIVE Final    Comment: (NOTE) The Xpert Xpress SARS-CoV-2/FLU/RSV plus assay is intended as an aid in the diagnosis of influenza from Nasopharyngeal swab specimens and should not be used as a sole basis for treatment. Nasal washings and aspirates are unacceptable for Xpert Xpress SARS-CoV-2/FLU/RSV testing.  Fact Sheet for Patients: EntrepreneurPulse.com.au  Fact Sheet for Healthcare Providers: IncredibleEmployment.be  This test is not yet approved or cleared by the [place] FDA and has been authorized for detection and/or diagnosis of SARS-CoV-2 by FDA under an Emergency Use Authorization (EUA). This EUA will remain in effect (meaning this test can be used) for the duration of the COVID-19 declaration under Section 564(b)(1) of the Act, 21 U.S.C. section 360bbb-3(b)(1), unless the authorization is terminated or revoked.  Performed at [facility], [place] [address] [place], [place] [address]     FURTHER DISCHARGE INSTRUCTIONS:  Get Medicines reviewed and adjusted: Please take all your medications with you for your next visit with your Primary MD  Laboratory/radiological data: Please request your Primary MD to go over all hospital tests and procedure/radiological results at the follow up, please ask your Primary MD to get all Hospital records sent to his/her office.  In some cases, they will be blood work, cultures and biopsy results  pending at the time of your discharge. Please request that your primary care M.D. goes through all the records of your hospital data and follows up on these results.  Also Note the following: If you experience worsening of your admission symptoms, develop shortness of breath, life threatening emergency, suicidal or homicidal thoughts you must seek medical attention immediately by calling 911 or calling your MD immediately  if  symptoms less severe.  You must read complete instructions/literature along with all the possible adverse reactions/side effects for all the Medicines you take and that have been prescribed to you. Take any new Medicines after you have completely understood and accpet all the possible adverse reactions/side effects.   Do not drive when taking Pain medications or sleeping medications (Benzodaizepines)  Do not take more than prescribed Pain, Sleep and Anxiety Medications. It is not advisable to combine anxiety,sleep and pain medications without talking with your primary care practitioner  Special Instructions: If you have smoked or chewed Tobacco  in the last 2 yrs please stop smoking, stop any regular Alcohol  and or any Recreational drug use.  Wear Seat belts while driving.  Please note: You were cared for by a hospitalist during your hospital stay. Once you are discharged, your primary care physician will handle any further medical issues. Please note that NO REFILLS for any discharge medications will be authorized once you are discharged, as it is imperative that you return to your primary care physician (or establish a relationship with a primary care physician if you do not have one) for your post hospital discharge needs so that they can reassess your need for medications and monitor your lab values.  Total Time spent coordinating discharge including counseling, education and face to face time equals 35 minutes.  SignedOren Binet 12/01/2020 9:49 AM

## 2020-12-01 NOTE — Progress Notes (Signed)
Occupational Therapy Treatment Patient Details Name: Summer Hawkins MRN: [id number] DOB: May 07, 1934 Today's Date: 12/01/2020   History of present illness 86 y.o. F admitted on 9/15 due to increased confusion and weakness. Pt with PMH significant for A fib,L3-4, L4-5 fusion on 11/17/20, breast ca, CKD stage III, DM, HTN, pacemaker, lumpectomy, RC repair.   OT comments  Pt very limited by pain this session. Requiring max-total A +2 for sit<>stand transfers, pt unable to come to a full stand and pivot to [facility] this session. Once back in bed, pt requiring max A for bed mobility. OT will continue to follow acutely.    Recommendations for follow up therapy are one component of a multi-disciplinary discharge planning process, led by the attending physician.  Recommendations may be updated based on patient status, additional functional criteria and insurance authorization.    Follow Up Recommendations  SNF;Supervision/Assistance - 24 hour    Equipment Recommendations  None recommended by OT    Recommendations for Other Services      Precautions / Restrictions Precautions Precautions: Fall;Back Precaution Booklet Issued: No Precaution Comments: Pt had Lumbar fusion 10 days ago. Precautions reviewed, back brace not in room, PA Nundkumar reported that she was ok to move around in the room with no brace. Required Braces or Orthoses: Spinal Brace Spinal Brace: Lumbar corset Restrictions Weight Bearing Restrictions: No       Mobility Bed Mobility Overal bed mobility: Needs Assistance Bed Mobility: Rolling;Sidelying to Sit;Sit to Supine Rolling: Max assist Sidelying to sit: Max assist;+2 for physical assistance     Sit to sidelying: Total assist;+2 for physical assistance;+2 for safety/equipment General bed mobility comments: Pt requiring max A to come to sitting, limited by pain, total to return to supine due to weakness and pain limitations.    Transfers Overall transfer level: Needs  assistance Equipment used: Rolling walker (2 wheeled) Transfers: Sit to/from Stand Sit to Stand: Max assist;Total assist;+2 physical assistance;+2 safety/equipment;From elevated surface         General transfer comment: 5 attempts at standing with bed elevated, bed pad under pelvis to use as sling, pt only able to power up to 6 inches from the bed before immediately sitting back down.    Balance Overall balance assessment: Needs assistance Sitting-balance support: Bilateral upper extremity supported;Feet supported Sitting balance-Leahy Scale: Poor Sitting balance - Comments: Pt requiring cues to lean forward as she leans posteriorly significantly at times. Pt minguard to max assist to sit eOB   Standing balance support: Bilateral upper extremity supported Standing balance-Leahy Scale: Zero                             ADL either performed or assessed with clinical judgement   ADL Overall ADL's : Needs assistance/impaired     Grooming: Wash/dry hands;Wash/dry face;Oral care;Min guard;Sitting Grooming Details (indicate cue type and reason): completed sitting EOB, min g due to pt instability from back pain                 Toilet Transfer: Total assistance;+2 for physical assistance;+2 for safety/equipment;Squat-pivot;BSC Toilet Transfer Details (indicate cue type and reason): attempted x5, pt unable to come to stand/squat and pivot.           General ADL Comments: Pt requiring maxx-total A +2 for all functional mobility/transfers. Unable to pivot to [facility], had to use bed pan this session.     Vision       Perception  Praxis      Cognition Arousal/Alertness: Awake/alert Behavior During Therapy: WFL for tasks assessed/performed Overall Cognitive Status: No family/caregiver present to determine baseline cognitive functioning Area of Impairment: Orientation;Memory;Following commands;Safety/judgement;Awareness;Problem solving                  Orientation Level: Disoriented to;Time   Memory: Decreased recall of precautions Following Commands: Follows one step commands inconsistently Safety/Judgement: Decreased awareness of safety;Decreased awareness of deficits Awareness: Intellectual Problem Solving: Slow processing;Decreased initiation;Difficulty sequencing;Requires verbal cues;Requires tactile cues General Comments: Follows one step commands with increased time. Requiring sequencing cues with multistep tasks.        Exercises     Shoulder Instructions       General Comments VSS on RA, dressing on back dry    Pertinent Vitals/ Pain       Pain Assessment: Faces Faces Pain Scale: Hurts even more Pain Location: back Pain Descriptors / Indicators: Discomfort;Grimacing;Moaning Pain Intervention(s): Limited activity within patient's tolerance;Monitored during session;Repositioned  Home Living                                          Prior Functioning/Environment              Frequency  Min 2X/week        Progress Toward Goals  OT Goals(current goals can now be found in the care plan section)  Progress towards OT goals: Progressing toward goals  Acute Rehab OT Goals Patient Stated Goal: To go home per pt even though she needs 24 hour care OT Goal Formulation: With patient Time For Goal Achievement: 12/12/20 Potential to Achieve Goals: Fair ADL Goals Pt Will Perform Grooming: with modified independence;sitting Pt Will Perform Lower Body Bathing: with min assist;with adaptive equipment;sitting/lateral leans;sit to/from stand Pt Will Perform Upper Body Dressing: sitting;with supervision Pt Will Perform Lower Body Dressing: with mod assist;with adaptive equipment;sitting/lateral leans;sit to/from stand Pt Will Transfer to Toilet: with mod assist;stand pivot transfer Pt Will Perform Toileting - Clothing Manipulation and hygiene: with mod assist;with adaptive equipment;sitting/lateral  leans;sit to/from stand  Plan Discharge plan remains appropriate;Frequency remains appropriate    Co-evaluation                 AM-PAC OT "6 Clicks" Daily Activity     Outcome Measure   Help from another person eating meals?: A Little Help from another person taking care of personal grooming?: A Little Help from another person toileting, which includes using toliet, bedpan, or urinal?: Total Help from another person bathing (including washing, rinsing, drying)?: Total Help from another person to put on and taking off regular upper body clothing?: A Little Help from another person to put on and taking off regular lower body clothing?: Total 6 Click Score: 12    End of Session Equipment Utilized During Treatment: Gait belt;Rolling walker  OT Visit Diagnosis: Unsteadiness on feet (R26.81);Other abnormalities of gait and mobility (R26.89);Muscle weakness (generalized) (M62.81);Pain Pain - part of body:  (Back)   Activity Tolerance Patient limited by pain;Patient limited by fatigue   Patient Left in bed;with call bell/phone within reach;with bed alarm set   Nurse Communication Mobility status        Time: 2025-4270 OT Time Calculation (min): 23 min  Charges: OT General Charges $OT Visit: 1 Visit OT Treatments $Self Care/Home Management : 8-22 mins $Therapeutic Activity: 8-22 mins  Xiao Graul H., OTR/L Acute Rehabilitation  Thierry Dobosz Elane Vallory Oetken 12/01/2020, 3:57 PM

## 2020-12-01 NOTE — Progress Notes (Signed)
   Providing Compassionate, Quality Care - Together   Subjective: Patient reports her back pain is improving.  Objective: Vital signs in last 24 hours: Temp:  [98.1 F (36.7 C)-98.6 F (37 C)] 98.6 F (37 C) (09/21 1142) Pulse Rate:  [69-74] 70 (09/21 1142) Resp:  [15-20] 19 (09/21 1142) BP: (118-148)/(61-80) 129/80 (09/21 1142) SpO2:  [97 %-98 %] 98 % (09/21 1142)  Intake/Output from previous day: 09/20 0701 - 09/21 0700 In: 120 [P.O.:120] Out: 1200 [Urine:1200] Intake/Output this shift: Total I/O In: 120 [P.O.:120] Out: -   Afebrile Alert and oriented MAE, strength intact Incision with bruising and scab, mild erythema; Covered with ABD and tape dressing Small amount of brownish serosanguinous drainage  Lab Results: Recent Labs    11/30/20 0201  WBC 14.9*  HGB 10.4*  HCT 33.0*  PLT 414*   BMET Recent Labs    11/30/20 0201  NA 131*  K 4.5  CL 96*  CO2 21*  GLUCOSE 163*  BUN 26*  CREATININE 1.36*  CALCIUM 8.8*    Studies/Results: No results found.  Assessment/Plan: Patient presented to the emergency department from SNF on 11/25/2020 with altered mental status. She underwent a two level lumbar fusion on 11/17/2020. Work up in the ED revealed UTI with sepsis. Surgical incision reported to have minimal draiange.   LOS: 5 days   -Plan is to discharge to SNF today. -Abx per ID -Follow up with Dr. Arnoldo Morale 12/10/2020 at 12:34 pm   Viona Gilmore, DNP, AGNP-C Nurse Practitioner  [facility]. [address], [place], [place] [address] P: [phone]    F: [phone]  12/01/2020, 12:59 PM

## 2020-12-01 NOTE — Consult Note (Signed)
[facility] Inpatient Consult   12/01/2020  Summer Hawkins 05-Jan-1935 [id number]  [facility] [ACO]    Patient chart has been reviewed for readmissions less than 30 days and for high risk score for unplanned readmissions.  Patient assessed for community [place] [facility] CM) post hospital follow up needs.  Chart review reveals patient current disposition is for skilled nursing facility. No [facility] CM needs.  Of note, [facility] Care Management services does not replace or interfere with any services that are arranged by inpatient case management or social work.   Netta Cedars, MSN, [facility] Liaison Nurse Mobile Phone [phone]  Toll free office [phone]

## 2020-12-01 NOTE — Discharge Instructions (Addendum)
Information on my medicine - ELIQUIS (apixaban)  This medication education was reviewed with me or my healthcare representative as part of my discharge preparation.  You were taking this medication prior to this hospital admission.   Why was Eliquis prescribed for you? Eliquis was prescribed for you to reduce the risk of a blood clot forming that can cause a stroke if you have a medical condition called atrial fibrillation (a type of irregular heartbeat).  What do You need to know about Eliquis ? Take your Eliquis TWICE DAILY - one tablet in the morning and one tablet in the evening with or without food. If you have difficulty swallowing the tablet whole please discuss with your pharmacist how to take the medication safely.  Take Eliquis exactly as prescribed by your doctor and DO NOT stop taking Eliquis without talking to the doctor who prescribed the medication.  Stopping may increase your risk of developing a stroke.  Refill your prescription before you run out.  After discharge, you should have regular check-up appointments with your healthcare provider that is prescribing your Eliquis.  In the future your dose may need to be changed if your kidney function or weight changes by a significant amount or as you get older.  What do you do if you miss a dose? If you miss a dose, take it as soon as you remember on the same day and resume taking twice daily.  Do not take more than one dose of ELIQUIS at the same time to make up a missed dose.  Important Safety Information A possible side effect of Eliquis is bleeding. You should call your healthcare provider right away if you experience any of the following: Bleeding from an injury or your nose that does not stop. Unusual colored urine (red or dark brown) or unusual colored stools (red or black). Unusual bruising for unknown reasons. A serious fall or if you hit your head (even if there is no bleeding).  Some medicines may interact with  Eliquis and might increase your risk of bleeding or clotting while on Eliquis. To help avoid this, consult your healthcare provider or pharmacist prior to using any new prescription or non-prescription medications, including herbals, vitamins, non-steroidal anti-inflammatory drugs (NSAIDs) and supplements.  This website has more information on Eliquis (apixaban): http://www.eliquis.com/eliquis/home  

## 2020-12-01 NOTE — TOC Transition Note (Signed)
Transition of Care [facility]) - CM/SW Discharge Note   Patient Details  Name: Summer Hawkins MRN: [id number] Date of Birth: Jun 13, 1934  Transition of Care [facility]) CM/SW Contact:  Benard Halsted, LCSW Phone Number: 12/01/2020, 1:38 PM   Clinical Narrative:    Patient will DC to: [facility] Anticipated DC date: 12/01/20 Family notified: Granddaughter, Architectural technologist by: Corey Harold   Per MD patient ready for DC to [facility]. RN to call report prior to discharge [phone]). RN, patient, patient's family, and facility notified of DC. Discharge Summary and FL2 sent to facility. DC packet on chart. Ambulance transport requested for patient.   CSW will sign off for now as social work intervention is no longer needed. Please consult [place] again if new needs arise.       Barriers to Discharge: Barriers Resolved   Patient Goals and CMS Choice Patient states their goals for this hospitalization and ongoing recovery are:: Pt with the goal of recieving rehab to be more independent and return home. CMS Medicare.gov Compare Post Acute Care list provided to:: Patient Choice offered to / list presented to : Patient, Adult Children  Discharge Placement   Existing PASRR number confirmed : 12/01/20          Patient chooses bed at: [facility] Patient to be transferred to facility by: [place] Name of family member notified: [name] Patient and family notified of of transfer: 12/01/20  Discharge Plan and Services     Post Acute Care Choice: [place]                               Social Determinants of Health (SDOH) Interventions     Readmission Risk Interventions No flowsheet data found.

## 2020-12-02 DIAGNOSIS — N390 Urinary tract infection, site not specified: Secondary | ICD-10-CM | POA: Diagnosis not present

## 2020-12-02 DIAGNOSIS — R5381 Other malaise: Secondary | ICD-10-CM | POA: Diagnosis not present

## 2020-12-02 DIAGNOSIS — I5032 Chronic diastolic (congestive) heart failure: Secondary | ICD-10-CM | POA: Diagnosis not present

## 2020-12-02 DIAGNOSIS — A419 Sepsis, unspecified organism: Secondary | ICD-10-CM | POA: Diagnosis not present

## 2020-12-06 DIAGNOSIS — R410 Disorientation, unspecified: Secondary | ICD-10-CM | POA: Diagnosis not present

## 2020-12-06 DIAGNOSIS — Z9889 Other specified postprocedural states: Secondary | ICD-10-CM | POA: Diagnosis not present

## 2020-12-09 DIAGNOSIS — A419 Sepsis, unspecified organism: Secondary | ICD-10-CM | POA: Diagnosis not present

## 2020-12-09 DIAGNOSIS — N390 Urinary tract infection, site not specified: Secondary | ICD-10-CM | POA: Diagnosis not present

## 2020-12-09 DIAGNOSIS — R410 Disorientation, unspecified: Secondary | ICD-10-CM | POA: Diagnosis not present

## 2020-12-09 DIAGNOSIS — R5381 Other malaise: Secondary | ICD-10-CM | POA: Diagnosis not present

## 2020-12-10 DIAGNOSIS — L304 Erythema intertrigo: Secondary | ICD-10-CM | POA: Diagnosis not present

## 2020-12-14 ENCOUNTER — Other Ambulatory Visit: Payer: Self-pay | Admitting: *Deleted

## 2020-12-14 ENCOUNTER — Other Ambulatory Visit: Payer: Self-pay | Admitting: Neurosurgery

## 2020-12-14 NOTE — Patient Outreach (Addendum)
Member screened for potential [facility] Care Management needs. Mrs. Schoch resides in [facility].   Communication sent to [facility] to inquire about transition plans. Mrs. Cissell has been active with [facility].  Will follow for potential [facility] Care Management services.  Addendum: Update received from [facility] indicating Mrs. Rigaud's niece lives with her and that the grandaughter is primary caregiver. States care plan meeting conflicted with Mrs. Barhorst's appointment today. Will follow up.    Marthenia Rolling, MSN, RN,BSN [place] Acute Care Coordinator [phone] [facility] [phone]  (Toll free office)

## 2020-12-15 ENCOUNTER — Other Ambulatory Visit: Payer: Self-pay | Admitting: Neurosurgery

## 2020-12-17 ENCOUNTER — Encounter: Payer: Self-pay | Admitting: Internal Medicine

## 2020-12-17 NOTE — Progress Notes (Addendum)
Summer Hawkins has  a Pacemaker, I sent peri op device order request to [facility] via staff message.   I spoke with Summer Hawkins, Summer Hawkins's nurse at [facility] this am. Summer Hawkins reports that Summer Hawkins has not complained of chest pain or shortness of breath. Summer Hawkins reports that there are no cases of Covid in the facility at this time.  Summer Hawkins reports that Summer Hawkins 's CBGd have ran 118-220.  I faxed instructions to Summer Hawkins attention at [facility] which incleduled  medications to take, CBG instructions, hygiene, NPO.   Summer Hawkins reports that Summer Hawkins has a sacral wound, Summer Hawkins has not seen the wound; the skin care nurse was sitting beside Summer Hawkins, she reported that is almost healed.  Summer Hawkins has drainage from back incision.

## 2020-12-17 NOTE — Pre-Procedure Instructions (Addendum)
    Summer Hawkins  12/17/2020    Please arrive at the Main Entrance at [facility] at 9:00 AM on Monday, October 10.   Pre - Surgery Desk number is [phone] call for any questions or problems.  Very Important: >>>>>Please send patient's Medication Record with medications administrated documentation. ( this information is required prior to OR. This includes medications that may have been on hold for surgery)<<<<<   DO NOT: Eat or drink,chew gum or have hard candy in your mouth after midnight Sunday, Oct. 9.  Medications patient may take with a sip of water: amLODipine (NORVASC)  Anstozole (Armidex) docusate sodium (COLACE) levothyroxine (SYNTHROID) metoprolol succinate (TOPROL XL timolol (TIMOPTIC) eye drops.  If needed: acetaminophen (TYLENOL) cyclobenzaprine (FLEXERIL)  STOP taking Aspirin, Aspirin Products (Goody Powder, Excedrin Migraine), Ibuprofen (Advil), Naproxen (Aleve), Vitamins and Herbal Products (ie Fish Oil).  Follow your surgeon's instructions regarding apixaban Arne [place])-  Diabetic medications: Sunday, 12/19/20 HS Give patient 1/2 dose of Levemir- 6 units. Monday AM Do not give  oral medications for Diabetes. Monday AM if CBG is greater than 220- give patient 1/2 of the sliding scale dose.  How to manage my blood sugar before surgery? Check  blood sugar the morning of your surgery when you wake up and every 2 hours until you get to the Short Stay unit. If your blood sugar is less than 70 mg/dL, you will need to treat for low blood sugar: Treat a low blood sugar (less than 70 mg/dL) with, 1/2 cup (4 ounces) of apple or cranberry juice ( before 0900), 4 glucose tablets, OR glucose gel. Recheck blood sugar in 15 minutes after treatment (to make sure it is greater than 70 mg/dL). If your blood sugar is not greater than 70 mg/dL on recheck, call [phone]  for further instructions. Report your blood sugar to the short stay nurse when you get to Short  Stay.  Monday AM , [name] needs to have a shower or have a good wash up with antibacteria soap. Dry off with a clean towel. Do not put any lotions powders, cologne or deodorant No jewelry, piercing, no nail polish or artificial nails.  [facility] is not responsible for belongs. Patient may not wear glasses, contact lens, hearing aids, dentures or partials  in OR.  If you have a case for glasses or contacts, please bring it.  Dentures will be put in a Denture case.

## 2020-12-17 NOTE — Progress Notes (Signed)
[facility] DEVICE PROGRAMMING  Patient Information: Name:  EZELL MELIKIAN  DOB:  01-26-1935  MRN:  [id number]     Planned Procedure:Lumbar Wound debridement  Surgeon:  Dr. Frederich Cha  Date of Procedure:  12/20/20  Cautery will be used.  Position during surgery:  Prone   Please send documentation back to:  [facility] (Fax # [phone])       Device Information:  Clinic EP Physician:  Cristopher Peru, MD   Device Type:  Pacemaker Medtronic [id number] Santina Evans DR MRI Manufacturer and Phone #:  Medtronic: [id number] Pacemaker Dependent?:  Yes.   Date of Last Device Check:  10/21/20 remote Normal Device Function?:  Yes.    Electrophysiologist's Recommendations:  Have magnet available. Provide continuous ECG monitoring when magnet is used or reprogramming is to be performed.  Procedure will likely interfere with device function.  Device should be programmed:  Asynchronous pacing during procedure and returned to normal programming after procedure  Per Device Clinic Standing Orders, Willadean Carol, [place]  10:22 AM 12/17/2020

## 2020-12-20 ENCOUNTER — Inpatient Hospital Stay (HOSPITAL_COMMUNITY): Payer: Medicare Other | Admitting: Certified Registered"

## 2020-12-20 ENCOUNTER — Encounter (HOSPITAL_COMMUNITY)
Admission: RE | Disposition: A | Discharge status: Skilled Nursing Facility | Payer: Self-pay | Source: Home / Self Care | Attending: Neurosurgery

## 2020-12-20 ENCOUNTER — Encounter (HOSPITAL_COMMUNITY): Payer: Self-pay | Admitting: Neurosurgery

## 2020-12-20 ENCOUNTER — Inpatient Hospital Stay (HOSPITAL_COMMUNITY)
Admission: RE | Admit: 2020-12-20 | Discharge: 2020-12-27 | DRG: 920 | Disposition: A | Discharge status: Skilled Nursing Facility | Payer: Medicare Other | Attending: Neurosurgery | Admitting: Neurosurgery

## 2020-12-20 ENCOUNTER — Other Ambulatory Visit: Payer: Self-pay

## 2020-12-20 DIAGNOSIS — T8131XA Disruption of external operation (surgical) wound, not elsewhere classified, initial encounter: Principal | ICD-10-CM | POA: Diagnosis present

## 2020-12-20 DIAGNOSIS — Y838 Other surgical procedures as the cause of abnormal reaction of the patient, or of later complication, without mention of misadventure at the time of the procedure: Secondary | ICD-10-CM | POA: Diagnosis present

## 2020-12-20 DIAGNOSIS — Z853 Personal history of malignant neoplasm of breast: Secondary | ICD-10-CM

## 2020-12-20 DIAGNOSIS — Z95828 Presence of other vascular implants and grafts: Secondary | ICD-10-CM

## 2020-12-20 DIAGNOSIS — Z833 Family history of diabetes mellitus: Secondary | ICD-10-CM

## 2020-12-20 DIAGNOSIS — Z90710 Acquired absence of both cervix and uterus: Secondary | ICD-10-CM

## 2020-12-20 DIAGNOSIS — E669 Obesity, unspecified: Secondary | ICD-10-CM | POA: Diagnosis present

## 2020-12-20 DIAGNOSIS — Z950 Presence of cardiac pacemaker: Secondary | ICD-10-CM

## 2020-12-20 DIAGNOSIS — Z6833 Body mass index (BMI) 33.0-33.9, adult: Secondary | ICD-10-CM

## 2020-12-20 DIAGNOSIS — R262 Difficulty in walking, not elsewhere classified: Secondary | ICD-10-CM | POA: Diagnosis present

## 2020-12-20 DIAGNOSIS — B964 Proteus (mirabilis) (morganii) as the cause of diseases classified elsewhere: Secondary | ICD-10-CM | POA: Diagnosis present

## 2020-12-20 DIAGNOSIS — N189 Chronic kidney disease, unspecified: Secondary | ICD-10-CM | POA: Diagnosis present

## 2020-12-20 DIAGNOSIS — Z803 Family history of malignant neoplasm of breast: Secondary | ICD-10-CM

## 2020-12-20 DIAGNOSIS — Z87440 Personal history of urinary (tract) infections: Secondary | ICD-10-CM

## 2020-12-20 DIAGNOSIS — E785 Hyperlipidemia, unspecified: Secondary | ICD-10-CM | POA: Diagnosis present

## 2020-12-20 DIAGNOSIS — I129 Hypertensive chronic kidney disease with stage 1 through stage 4 chronic kidney disease, or unspecified chronic kidney disease: Secondary | ICD-10-CM | POA: Diagnosis present

## 2020-12-20 DIAGNOSIS — R531 Weakness: Secondary | ICD-10-CM | POA: Diagnosis present

## 2020-12-20 DIAGNOSIS — T8141XA Infection following a procedure, superficial incisional surgical site, initial encounter: Secondary | ICD-10-CM | POA: Diagnosis present

## 2020-12-20 DIAGNOSIS — E1122 Type 2 diabetes mellitus with diabetic chronic kidney disease: Secondary | ICD-10-CM | POA: Diagnosis present

## 2020-12-20 DIAGNOSIS — L7682 Other postprocedural complications of skin and subcutaneous tissue: Secondary | ICD-10-CM | POA: Diagnosis present

## 2020-12-20 DIAGNOSIS — Z8249 Family history of ischemic heart disease and other diseases of the circulatory system: Secondary | ICD-10-CM

## 2020-12-20 DIAGNOSIS — E039 Hypothyroidism, unspecified: Secondary | ICD-10-CM | POA: Diagnosis present

## 2020-12-20 DIAGNOSIS — Z20822 Contact with and (suspected) exposure to covid-19: Secondary | ICD-10-CM | POA: Diagnosis present

## 2020-12-20 HISTORY — PX: LUMBAR WOUND DEBRIDEMENT: SHX1988

## 2020-12-20 LAB — GLUCOSE, CAPILLARY
Glucose-Capillary: 180 mg/dL — ABNORMAL HIGH (ref 70–99)
Glucose-Capillary: 137 mg/dL — ABNORMAL HIGH (ref 70–99)
Glucose-Capillary: 143 mg/dL — ABNORMAL HIGH (ref 70–99)
Glucose-Capillary: 157 mg/dL — ABNORMAL HIGH (ref 70–99)
Glucose-Capillary: 197 mg/dL — ABNORMAL HIGH (ref 70–99)

## 2020-12-20 LAB — SURGICAL PCR SCREEN
MRSA, PCR: NEGATIVE
Staphylococcus aureus: NEGATIVE

## 2020-12-20 LAB — POCT I-STAT, CHEM 8
BUN: 30 mg/dL — ABNORMAL HIGH (ref 8–23)
Calcium, Ion: 1.39 mmol/L (ref 1.15–1.40)
Chloride: 111 mmol/L (ref 98–111)
Creatinine, Ser: 1.6 mg/dL — ABNORMAL HIGH (ref 0.44–1.00)
Glucose, Bld: 174 mg/dL — ABNORMAL HIGH (ref 70–99)
HCT: 39 % (ref 36.0–46.0)
Hemoglobin: 13.3 g/dL (ref 12.0–15.0)
Potassium: 4.3 mmol/L (ref 3.5–5.1)
Sodium: 145 mmol/L (ref 135–145)
TCO2: 23 mmol/L (ref 22–32)

## 2020-12-20 LAB — SARS CORONAVIRUS 2 BY RT PCR (HOSPITAL ORDER, PERFORMED IN ~~LOC~~ HOSPITAL LAB): SARS Coronavirus 2: NEGATIVE

## 2020-12-20 SURGERY — LUMBAR WOUND DEBRIDEMENT
Anesthesia: General

## 2020-12-20 MED ORDER — VANCOMYCIN HCL IN DEXTROSE 1-5 GM/200ML-% IV SOLN
1000.0000 mg | INTRAVENOUS | Status: AC
  Administered 2020-12-20: 1000 mg via INTRAVENOUS
  Filled 2020-12-20: qty 200

## 2020-12-20 MED ORDER — OXYCODONE HCL 5 MG/5ML PO SOLN: 5.0000 mg | Freq: Once | ORAL | Status: DC | PRN

## 2020-12-20 MED ORDER — PHENOL 1.4 % MT LIQD: 1.0000 | OROMUCOSAL | Status: DC | PRN

## 2020-12-20 MED ORDER — CHLORHEXIDINE GLUCONATE CLOTH 2 % EX PADS: 6.0000 | MEDICATED_PAD | Freq: Once | CUTANEOUS | Status: DC

## 2020-12-20 MED ORDER — SODIUM CHLORIDE 0.9 % IV SOLN
2.0000 g | INTRAVENOUS | Status: DC
  Administered 2020-12-20 – 2020-12-23 (×4): 2 g via INTRAVENOUS
  Filled 2020-12-20 (×7): qty 20

## 2020-12-20 MED ORDER — SODIUM CHLORIDE 0.9% FLUSH: 3.0000 mL | INTRAVENOUS | Status: DC | PRN

## 2020-12-20 MED ORDER — POLYETHYLENE GLYCOL 3350 17 G PO PACK
17.0000 g | PACK | Freq: Every day | ORAL | Status: DC | PRN
Start: 2020-12-20 — End: 2020-12-28
  Administered 2020-12-22: 17 g via ORAL
  Filled 2020-12-20: qty 1

## 2020-12-20 MED ORDER — BISACODYL 10 MG RE SUPP: 10.0000 mg | Freq: Every day | RECTAL | Status: DC | PRN

## 2020-12-20 MED ORDER — SUGAMMADEX SODIUM 200 MG/2ML IV SOLN
INTRAVENOUS | Status: DC | PRN
  Administered 2020-12-20: 200 mg via INTRAVENOUS

## 2020-12-20 MED ORDER — MORPHINE SULFATE (PF) 4 MG/ML IV SOLN
4.0000 mg | INTRAVENOUS | Status: DC | PRN
  Administered 2020-12-24 – 2020-12-27 (×2): 4 mg via INTRAVENOUS
  Filled 2020-12-20 (×2): qty 1

## 2020-12-20 MED ORDER — LIDOCAINE 2% (20 MG/ML) 5 ML SYRINGE
INTRAMUSCULAR | Status: AC
  Filled 2020-12-20: qty 5

## 2020-12-20 MED ORDER — CYCLOBENZAPRINE HCL 10 MG PO TABS
10.0000 mg | ORAL_TABLET | Freq: Three times a day (TID) | ORAL | Status: DC | PRN
  Administered 2020-12-23 – 2020-12-25 (×2): 10 mg via ORAL
  Filled 2020-12-20 (×2): qty 1

## 2020-12-20 MED ORDER — CHLORHEXIDINE GLUCONATE 0.12 % MT SOLN
15.0000 mL | Freq: Once | OROMUCOSAL | Status: AC
  Administered 2020-12-20: 15 mL via OROMUCOSAL
  Filled 2020-12-20: qty 15

## 2020-12-20 MED ORDER — ACETAMINOPHEN 500 MG PO TABS: 1000.0000 mg | ORAL_TABLET | Freq: Once | ORAL | Status: AC

## 2020-12-20 MED ORDER — 0.9 % SODIUM CHLORIDE (POUR BTL) OPTIME
TOPICAL | Status: DC | PRN
  Administered 2020-12-20: 1000 mL

## 2020-12-20 MED ORDER — SODIUM CHLORIDE 0.9% FLUSH
3.0000 mL | Freq: Two times a day (BID) | INTRAVENOUS | Status: DC
  Administered 2020-12-21 – 2020-12-27 (×11): 3 mL via INTRAVENOUS

## 2020-12-20 MED ORDER — ONDANSETRON HCL 4 MG/2ML IJ SOLN: 4.0000 mg | Freq: Four times a day (QID) | INTRAMUSCULAR | Status: DC | PRN

## 2020-12-20 MED ORDER — ORAL CARE MOUTH RINSE: 15.0000 mL | Freq: Once | OROMUCOSAL | Status: AC

## 2020-12-20 MED ORDER — VANCOMYCIN HCL IN DEXTROSE 1-5 GM/200ML-% IV SOLN
1000.0000 mg | INTRAVENOUS | Status: DC
  Administered 2020-12-22: 1000 mg via INTRAVENOUS
  Filled 2020-12-20: qty 200

## 2020-12-20 MED ORDER — MIDAZOLAM HCL 2 MG/2ML IJ SOLN: 0.5000 mg | Freq: Once | INTRAMUSCULAR | Status: DC | PRN

## 2020-12-20 MED ORDER — LIDOCAINE-EPINEPHRINE 1 %-1:100000 IJ SOLN
INTRAMUSCULAR | Status: AC
  Filled 2020-12-20: qty 1

## 2020-12-20 MED ORDER — DOCUSATE SODIUM 100 MG PO CAPS
100.0000 mg | ORAL_CAPSULE | Freq: Two times a day (BID) | ORAL | Status: DC
  Administered 2020-12-20 – 2020-12-27 (×13): 100 mg via ORAL
  Filled 2020-12-20 (×13): qty 1

## 2020-12-20 MED ORDER — ONDANSETRON HCL 4 MG/2ML IJ SOLN
INTRAMUSCULAR | Status: DC | PRN
  Administered 2020-12-20: 4 mg via INTRAVENOUS

## 2020-12-20 MED ORDER — MENTHOL 3 MG MT LOZG: 1.0000 | LOZENGE | OROMUCOSAL | Status: DC | PRN

## 2020-12-20 MED ORDER — LACTATED RINGERS IV SOLN: INTRAVENOUS | Status: DC

## 2020-12-20 MED ORDER — ACETAMINOPHEN 325 MG PO TABS: 650.0000 mg | ORAL_TABLET | ORAL | Status: DC | PRN

## 2020-12-20 MED ORDER — PHENYLEPHRINE 40 MCG/ML (10ML) SYRINGE FOR IV PUSH (FOR BLOOD PRESSURE SUPPORT)
PREFILLED_SYRINGE | INTRAVENOUS | Status: DC | PRN
  Administered 2020-12-20: 80 ug via INTRAVENOUS

## 2020-12-20 MED ORDER — ONDANSETRON HCL 4 MG PO TABS: 4.0000 mg | ORAL_TABLET | Freq: Four times a day (QID) | ORAL | Status: DC | PRN

## 2020-12-20 MED ORDER — OXYCODONE HCL 5 MG PO TABS
10.0000 mg | ORAL_TABLET | ORAL | Status: DC | PRN
  Filled 2020-12-20: qty 2

## 2020-12-20 MED ORDER — PHENYLEPHRINE HCL-NACL 20-0.9 MG/250ML-% IV SOLN
INTRAVENOUS | Status: DC | PRN
  Administered 2020-12-20: 20 ug/min via INTRAVENOUS

## 2020-12-20 MED ORDER — METOPROLOL SUCCINATE ER 25 MG PO TB24
25.0000 mg | ORAL_TABLET | Freq: Once | ORAL | Status: AC
  Administered 2020-12-20: 25 mg via ORAL
  Filled 2020-12-20: qty 1

## 2020-12-20 MED ORDER — PROPOFOL 10 MG/ML IV BOLUS
INTRAVENOUS | Status: AC
  Filled 2020-12-20: qty 20

## 2020-12-20 MED ORDER — FENTANYL CITRATE (PF) 250 MCG/5ML IJ SOLN
INTRAMUSCULAR | Status: DC | PRN
  Administered 2020-12-20 (×2): 50 ug via INTRAVENOUS

## 2020-12-20 MED ORDER — MEPERIDINE HCL 25 MG/ML IJ SOLN: 6.2500 mg | INTRAMUSCULAR | Status: DC | PRN

## 2020-12-20 MED ORDER — ACETAMINOPHEN 500 MG PO TABS
1000.0000 mg | ORAL_TABLET | Freq: Four times a day (QID) | ORAL | Status: AC
  Administered 2020-12-20 – 2020-12-21 (×4): 1000 mg via ORAL
  Filled 2020-12-20 (×4): qty 2

## 2020-12-20 MED ORDER — THROMBIN 5000 UNITS EX SOLR
CUTANEOUS | Status: AC
  Filled 2020-12-20: qty 5000

## 2020-12-20 MED ORDER — PROPOFOL 10 MG/ML IV BOLUS
INTRAVENOUS | Status: DC | PRN
  Administered 2020-12-20: 80 mg via INTRAVENOUS

## 2020-12-20 MED ORDER — ROCURONIUM BROMIDE 10 MG/ML (PF) SYRINGE
PREFILLED_SYRINGE | INTRAVENOUS | Status: DC | PRN
  Administered 2020-12-20: 60 mg via INTRAVENOUS

## 2020-12-20 MED ORDER — ZOLPIDEM TARTRATE 5 MG PO TABS
5.0000 mg | ORAL_TABLET | Freq: Every evening | ORAL | Status: DC | PRN
  Administered 2020-12-25 – 2020-12-26 (×2): 5 mg via ORAL
  Filled 2020-12-20 (×2): qty 1

## 2020-12-20 MED ORDER — ROCURONIUM BROMIDE 10 MG/ML (PF) SYRINGE
PREFILLED_SYRINGE | INTRAVENOUS | Status: AC
  Filled 2020-12-20: qty 10

## 2020-12-20 MED ORDER — ACETAMINOPHEN 650 MG RE SUPP: 650.0000 mg | RECTAL | Status: DC | PRN

## 2020-12-20 MED ORDER — SODIUM CHLORIDE 0.9 % IV SOLN
250.0000 mL | INTRAVENOUS | Status: DC
Start: 2020-12-20 — End: 2020-12-28
  Administered 2020-12-20 – 2020-12-24 (×3): 250 mL via INTRAVENOUS

## 2020-12-20 MED ORDER — HYDROMORPHONE HCL 1 MG/ML IJ SOLN
INTRAMUSCULAR | Status: AC
  Filled 2020-12-20: qty 1

## 2020-12-20 MED ORDER — FENTANYL CITRATE (PF) 250 MCG/5ML IJ SOLN
INTRAMUSCULAR | Status: AC
  Filled 2020-12-20: qty 5

## 2020-12-20 MED ORDER — OXYCODONE HCL 5 MG PO TABS: 5.0000 mg | ORAL_TABLET | Freq: Once | ORAL | Status: DC | PRN

## 2020-12-20 MED ORDER — BACITRACIN ZINC 500 UNIT/GM EX OINT
TOPICAL_OINTMENT | CUTANEOUS | Status: AC
  Filled 2020-12-20: qty 28.35

## 2020-12-20 MED ORDER — BUPIVACAINE-EPINEPHRINE 0.5% -1:200000 IJ SOLN
INTRAMUSCULAR | Status: AC
  Filled 2020-12-20: qty 1

## 2020-12-20 MED ORDER — HYDROMORPHONE HCL 1 MG/ML IJ SOLN
0.2500 mg | INTRAMUSCULAR | Status: DC | PRN
  Administered 2020-12-20: 0.25 mg via INTRAVENOUS

## 2020-12-20 MED ORDER — OXYCODONE HCL 5 MG PO TABS
5.0000 mg | ORAL_TABLET | ORAL | Status: DC | PRN
  Administered 2020-12-23 – 2020-12-26 (×3): 5 mg via ORAL
  Filled 2020-12-20 (×2): qty 1

## 2020-12-20 MED ORDER — PROMETHAZINE HCL 25 MG/ML IJ SOLN: 6.2500 mg | INTRAMUSCULAR | Status: DC | PRN

## 2020-12-20 MED ORDER — LIDOCAINE 2% (20 MG/ML) 5 ML SYRINGE
INTRAMUSCULAR | Status: DC | PRN
Start: 2020-12-20 — End: 2020-12-20
  Administered 2020-12-20: 20 mg via INTRAVENOUS

## 2020-12-20 SURGICAL SUPPLY — 41 items
APL SKNCLS STERI-STRIP NONHPOA (GAUZE/BANDAGES/DRESSINGS) ×1
BAG COUNTER SPONGE SURGICOUNT (BAG) ×2 IMPLANT
BAG SPNG CNTER NS LX DISP (BAG) ×1
BENZOIN TINCTURE PRP APPL 2/3 (GAUZE/BANDAGES/DRESSINGS) ×2 IMPLANT
BLADE CLIPPER SURG (BLADE) IMPLANT
CANISTER SUCT 3000ML PPV (MISCELLANEOUS) ×2 IMPLANT
CANISTER WOUNDNEG PRESSURE 500 (CANNISTER) ×1 IMPLANT
CARTRIDGE OIL MAESTRO DRILL (MISCELLANEOUS) ×1 IMPLANT
DIFFUSER DRILL AIR PNEUMATIC (MISCELLANEOUS) ×2 IMPLANT
DRAPE LAPAROTOMY 100X72X124 (DRAPES) ×2 IMPLANT
DRAPE SURG 17X23 STRL (DRAPES) ×8 IMPLANT
DRSG VAC ATS SM SENSATRAC (GAUZE/BANDAGES/DRESSINGS) ×1 IMPLANT
ELECT REM PT RETURN 9FT ADLT (ELECTROSURGICAL) ×2
ELECTRODE REM PT RTRN 9FT ADLT (ELECTROSURGICAL) ×1 IMPLANT
GAUZE 4X4 16PLY ~~LOC~~+RFID DBL (SPONGE) ×1 IMPLANT
GAUZE SPONGE 4X4 12PLY STRL (GAUZE/BANDAGES/DRESSINGS) ×2 IMPLANT
GLOVE EXAM NITRILE XL STR (GLOVE) IMPLANT
GLOVE SURG ENC MOIS LTX SZ8 (GLOVE) ×2 IMPLANT
GLOVE SURG ENC MOIS LTX SZ8.5 (GLOVE) ×2 IMPLANT
GOWN STRL REUS W/ TWL LRG LVL3 (GOWN DISPOSABLE) IMPLANT
GOWN STRL REUS W/ TWL XL LVL3 (GOWN DISPOSABLE) IMPLANT
GOWN STRL REUS W/TWL LRG LVL3 (GOWN DISPOSABLE)
GOWN STRL REUS W/TWL XL LVL3 (GOWN DISPOSABLE) ×4
KIT BASIN OR (CUSTOM PROCEDURE TRAY) ×2 IMPLANT
KIT DRSG VAC SLVR GRANUFM (MISCELLANEOUS) IMPLANT
KIT TURNOVER KIT B (KITS) ×2 IMPLANT
NEEDLE HYPO 22GX1.5 SAFETY (NEEDLE) IMPLANT
NS IRRIG 1000ML POUR BTL (IV SOLUTION) ×2 IMPLANT
OIL CARTRIDGE MAESTRO DRILL (MISCELLANEOUS)
PACK LAMINECTOMY NEURO (CUSTOM PROCEDURE TRAY) ×2 IMPLANT
PAD ARMBOARD 7.5X6 YLW CONV (MISCELLANEOUS) ×6 IMPLANT
STRIP CLOSURE SKIN 1/2X4 (GAUZE/BANDAGES/DRESSINGS) ×1 IMPLANT
SUT ETHILON 2 0 PSLX (SUTURE) IMPLANT
SUT VIC AB 1 CT1 18XBRD ANBCTR (SUTURE) ×1 IMPLANT
SUT VIC AB 1 CT1 8-18 (SUTURE)
SUT VIC AB 2-0 CP2 18 (SUTURE) ×1 IMPLANT
SWAB COLLECTION DEVICE MRSA (MISCELLANEOUS) ×1 IMPLANT
SWAB CULTURE ESWAB REG 1ML (MISCELLANEOUS) ×1 IMPLANT
TOWEL GREEN STERILE (TOWEL DISPOSABLE) ×2 IMPLANT
TOWEL GREEN STERILE FF (TOWEL DISPOSABLE) ×2 IMPLANT
WATER STERILE IRR 1000ML POUR (IV SOLUTION) ×2 IMPLANT

## 2020-12-20 NOTE — Consult Note (Addendum)
[facility] for Infectious Diseases                                                                                        Patient Identification: Patient Name: Summer Hawkins MRN: [id number] [place] Date: 12/20/2020  9:02 AM Today's Date: 12/20/2020 Reason for consult: wound dehiscence  Requesting provider: Newman Pies   Active Problems:   Postoperative complication of skin involving drainage from surgical wound   Antibiotics: Vancomycin 10/10-c                    Ceftriaxone 10/10-c  Lines/hardwares  Assessment # Wound dehiscence in the setting of recent instrumented TLIF -Per review of OR and notes from surgery, " there is moderately large wound dehiscence superficially to the subcutaneous tissue.  We obtain cultures.  I did not see any obvious infection but I assume it was infected.  The infection/dehiscence did not appear to go below the paraspinous musculature."   #Recent h/o Proteus mirabilis bacteremia s/p appropriate tx  # Recent Klebsiella UTI # DM # CKD # Penicillin allery- she has tolerated cephalosporins in the past per ID pharm, OK to continue ceftriaxone.   Recommendations  No obvious signs of deep tissue infection. However, patient seems to have continued drainage with delayed healing. Of note, patient also had recent hospital admission for Proteus mirabilis bacteremia with unclear source with concerns for possible wound infection at the back. She seemed to have drainage from the surgical site at that time. She was felt to not require surgery at that time.  2.   Continue Vancomycin and ceftriaxone for now pending cultures  3.   Follow blood cultures  4.   Will plan for PO vs IV abtx pending culture results. Will discuss with Dr Arnoldo Morale regarding his OR findings as well.  5.   FU ESR and CRP   Following   Rest of the management as per the primary team. Please call with questions or concerns.   Thank you for the consult  Rosiland Oz, MD Infectious Disease Physician [facility] for Infectious Disease [address] [place], [place] [address] Phone: [phone]  Fax: [phone]  __________________________________________________________________________________________________________ HPI and Hospital Course: 86 YO female with multiple comorbidities with h/o TLIFon 11/17/20 who was admitted for I and D of surgical site on 10/11 given concerns of wound dehiscence and infection. Patient was recently admitted 9/15-9/21/22 due to sepsis in the setting of Proteus bacteremia and Klebsiella UTI. Patient was seen by Neurosurgery  for post op drainage of back wound, however had low suspicion for back infection. Seen by ID, received few days of IV abtx followed by cephalexin to complete 14 days course with end date 12/09/20 for GN bacteremia. She continued to have drainage when seen in the office by Neurosx and was started on Doxycyline.   Patient tells me she is currently living in a SNF and she though she her surgical site was healing well post op but started draining later. She also had subjective hot and cold sensation. Denies any nausea, vomiting, abdominal pain and diarrhea. Denies cough, chest pain and  SOB. Denies smoking, alcohol and using IVDU. She tells me she has not been able to walk since her 1st surgery.  She has been working with PT and OT.   ROS: General- Denies loss of appetite and loss of weight HEENT - Denies headache, blurry vision, neck pain, sinus pain Chest - Denies any chest pain, SOB or cough CVS- Denies any dizziness/lightheadedness, syncopal attacks, palpitations Abdomen- Denies any nausea, vomiting, abdominal pain, hematochezia and diarrhea Neuro - Denies any numbness, tingling sensation, weakness in the lower extremities  Psych - Denies any changes in mood irritability or depressive symptoms GU- Denies any burning, dysuria, hematuria  or increased frequency of urination Skin - denies any rashes/lesions MSK - denies any joint pain/swelling or restricted ROM   Past Medical History:  Diagnosis Date   Atrial fibrillation ([place])    Breast cancer ([place])    Breast cancer, left ([place])    Chronic kidney disease    stage III - patient was unaware   Diabetes mellitus    Dyspnea    Dysrhythmia    Afib   H/O: hysterectomy    History of colonoscopy 01/25/2010   History of mammogram 08/04/2009   Hyperlipidemia    Hypertension    Hypothyroidism    Ketoacidosis, diabetic, no coma, non-insulin dependent    Type II   Vitamin B12 deficiency    Past Surgical History:  Procedure Laterality Date   ABDOMINAL HYSTERECTOMY     Bilateral foot surgery     BREAST EXCISIONAL BIOPSY Left 12/2015   BREAST LUMPECTOMY Left    2017   BREAST LUMPECTOMY WITH RADIOACTIVE SEED AND SENTINEL LYMPH NODE BIOPSY Left 01/19/2016   Procedure: LEFT BREAST LUMPECTOMY WITH RADIOACTIVE SEED AND SENTINEL LYMPH NODE BIOPSY;  Surgeon: Autumn Messing III, MD;  Location: [place];  Service: General;  Laterality: Left;   BREAST LUMPECTOMY WITH RADIOACTIVE SEED LOCALIZATION Left 01/19/2016   CARDIOVERSION  02/27/2011   Procedure: CARDIOVERSION;  Surgeon: Loralie Champagne, MD;  Location: [place];  Service: Cardiovascular;  Laterality: N/A;   LAPAROSCOPIC CHOLECYSTECTOMY     PACEMAKER IMPLANT N/A 12/02/2018   Procedure: PACEMAKER IMPLANT;  Surgeon: Evans Lance, MD;  Location: [facility];  Service: Cardiovascular;  Laterality: N/A;   ROTATOR CUFF REPAIR Left    TUBAL LIGATION       Scheduled Meds:  acetaminophen  1,000 mg Oral Q6H   docusate sodium  100 mg Oral BID   HYDROmorphone       sodium chloride flush  3 mL Intravenous Q12H   Continuous Infusions:  sodium chloride 250 mL (12/20/20 1629)   cefTRIAXone (ROCEPHIN)  IV     [START ON 12/22/2020] vancomycin     PRN Meds:.acetaminophen **OR** acetaminophen, bisacodyl, cyclobenzaprine,  menthol-cetylpyridinium **OR** phenol, morphine injection, ondansetron **OR** ondansetron (ZOFRAN) IV, oxyCODONE, oxyCODONE, polyethylene glycol, sodium chloride flush, zolpidem  Allergies  Allergen Reactions   Bee Venom Shortness Of Breath, Nausea And Vomiting and Other (See Comments)    Makes the patient feel faint, also   Penicillins Anaphylaxis, Hives, Swelling and Other (See Comments)    Has patient had a PCN reaction causing immediate rash, facial/tongue/throat swelling, SOB or lightheadedness with hypotension: Yes Has patient had a PCN reaction causing severe rash involving mucus membranes or skin necrosis: No Has patient had a PCN reaction that required hospitalization: No Has patient had a PCN reaction occurring within the last 10 years: No If all of the above answers are "NO", then may proceed with Cephalosporin  use.    Lisinopril Cough   Zocor [Simvastatin] Other (See Comments)    memory changes   Liraglutide Rash and Other (See Comments)    Rash at injection site   Social History   Socioeconomic History   Marital status: Widowed    Spouse name: Not on file   Number of children: 2   Years of education: Not on file   Highest education level: Not on file  Occupational History    Employer: OTHER    Comment: Worked at [employer] Use   Smoking status: Never   Smokeless tobacco: Never  Vaping Use   Vaping Use: Never used  Substance and Sexual Activity   Alcohol use: No    Alcohol/week: 0.0 standard drinks   Drug use: No   Sexual activity: Not on file  Other Topics Concern   Not on file  Social History Narrative   Patient since 37   Husband with prostate cancer   10-siblings-no cancer   Social Determinants of Health   Financial Resource Strain: Not on file  Food Insecurity: Not on file  Transportation Needs: Not on file  Physical Activity: Not on file  Stress: Not on file  Social Connections: Not on file   Family History  Problem Relation Age of  Onset   Heart failure Father 51       enlarged heart   Pneumonia Mother 61   Diabetes Mother 35   Breast cancer Sister    Cancer Neg Hx     Vitals BP 128/63 (BP Location: Right Arm)   Pulse 71   Temp 98.1 F (36.7 C) (Oral)   Resp 16   Ht _0  (1.651 m)   Wt 90.7 kg   LMP  (LMP Unknown)   SpO2 100%   BMI 33.28 kg/m    Physical Exam Constitutional:  Lying in bed, not in acute distress     Comments:   Cardiovascular:     Rate and Rhythm: Normal rate and regular rhythm.     Heart sounds:  Pulmonary:     Effort: Pulmonary effort is normal.     Comments:   Abdominal:     Palpations: Abdomen is soft.     Tenderness: Non tender   Musculoskeletal:        General: No swelling or tenderness.   Skin:    Comments:     Neurological:     General: Lower extremity weakness, she is able to left both lower legs briefly from bed. Power of upper extremities is 5/5   Psychiatric:        Mood and Affect: Mood normal.    Pertinent Microbiology Results for orders placed or performed during the hospital encounter of 12/20/20  SARS Coronavirus 2 by RT PCR (hospital order, performed in [facility] hospital lab) Nasopharyngeal Nasopharyngeal Swab     Status: None   Collection Time: 12/20/20  9:05 AM   Specimen: Nasopharyngeal Swab  Result Value Ref Range Status   SARS Coronavirus 2 NEGATIVE NEGATIVE Final    Comment: (NOTE) SARS-CoV-2 target nucleic acids are NOT DETECTED.  The SARS-CoV-2 RNA is generally detectable in upper and lower respiratory specimens during the acute phase of infection. The lowest concentration of SARS-CoV-2 viral copies this assay can detect is 250 copies / mL. A negative result does not preclude SARS-CoV-2 infection and should not be used as the sole basis for treatment or other patient management decisions.  A negative result may occur with  improper specimen collection / handling, submission of specimen other than nasopharyngeal swab, presence of  viral mutation(s) within the areas targeted by this assay, and inadequate number of viral copies (<250 copies / mL). A negative result must be combined with clinical observations, patient history, and epidemiological information.  Fact Sheet for Patients:   StrictlyIdeas.no  Fact Sheet for Healthcare Providers: BankingDealers.co.za  This test is not yet approved or  cleared by the [place] FDA and has been authorized for detection and/or diagnosis of SARS-CoV-2 by FDA under an Emergency Use Authorization (EUA).  This EUA will remain in effect (meaning this test can be used) for the duration of the COVID-19 declaration under Section 564(b)(1) of the Act, 21 U.S.C. section 360bbb-3(b)(1), unless the authorization is terminated or revoked sooner.  Performed at [facility], [place] [address] [place], [place] [address]   Surgical pcr screen     Status: None   Collection Time: 12/20/20  9:12 AM   Specimen: Nasal Mucosa; Nasal Swab  Result Value Ref Range Status   MRSA, PCR NEGATIVE NEGATIVE Final   Staphylococcus aureus NEGATIVE NEGATIVE Final    Comment: (NOTE) The Xpert SA Assay (FDA approved for NASAL specimens in patients 28 years of age and older), is one component of a comprehensive surveillance program. It is not intended to diagnose infection nor to guide or monitor treatment. Performed at [facility], [place] [address] [place], [place] [address]   Aerobic/Anaerobic Culture w Gram Stain (surgical/deep wound)     Status: None (Preliminary result)   Collection Time: 12/20/20 12:49 PM   Specimen: Soft Tissue, Other  Result Value Ref Range Status   Specimen Description WOUND  Final   Special Requests LUMBAR SUBCUTANEIOUS  Final   Gram Stain   Final    RARE WBC PRESENT, PREDOMINANTLY MONONUCLEAR NO ORGANISMS SEEN Performed at [facility], [place] [address], [place], [place] [address]    Culture PENDING   Incomplete   Report Status PENDING  Incomplete     Pertinent Lab seen by me: CBC Latest Ref Rng & Units 12/21/2020 12/20/2020 11/30/2020  WBC 4.0 - 10.5 K/uL 10.0 - 14.9(H)  Hemoglobin 12.0 - 15.0 g/dL 10.7(L) 13.3 10.4(L)  Hematocrit 36.0 - 46.0 % 35.1(L) 39.0 33.0(L)  Platelets 150 - 400 K/uL 373 - 414(H)   CMP Latest Ref Rng & Units 12/21/2020 12/20/2020 11/30/2020  Glucose 70 - 99 mg/dL 176(H) 174(H) 163(H)  BUN 8 - 23 mg/dL 26(H) 30(H) 26(H)  Creatinine 0.44 - 1.00 mg/dL 1.57(H) 1.60(H) 1.36(H)  Sodium 135 - 145 mmol/L 141 145 131(L)  Potassium 3.5 - 5.1 mmol/L 3.5 4.3 4.5  Chloride 98 - 111 mmol/L 110 111 96(L)  CO2 22 - 32 mmol/L 20(L) - 21(L)  Calcium 8.9 - 10.3 mg/dL 9.3 - 8.8(L)  Total Protein 6.5 - 8.1 g/dL - - -  Total Bilirubin 0.3 - 1.2 mg/dL - - -  Alkaline Phos 38 - 126 U/L - - -  AST 15 - 41 U/L - - -  ALT 0 - 44 U/L - - -    Pertinent Imagings/Other Imagings Plain films and CT images have been personally visualized and interpreted; radiology reports have been reviewed. Decision making incorporated into the Impression / Recommendations.   I spent more than 70  minutes for this patient encounter including review of prior medical records/discussing diagnostics and treatment plan with the patient/family/coordinate care with primary/other specialits with greater than 50% of time in face  to face encounter.   Electronically signed by:   Rosiland Oz, MD Infectious Disease Physician [facility] for Infectious Disease Pager: [phone]

## 2020-12-20 NOTE — Transfer of Care (Signed)
Immediate Anesthesia Transfer of Care Note  Patient: Summer Hawkins  Procedure(s) Performed: LUMBAR WOUND DEBRIDEMENT WITH PLACEMENTOF LUMBAR WOUND VAC  Patient Location: PACU  Anesthesia Type:General  Level of Consciousness: awake, alert  and oriented  Airway & Oxygen Therapy: Patient Spontanous Breathing  Post-op Assessment: Report given to RN, Post -op Vital signs reviewed and stable and Patient moving all extremities  Post vital signs: Reviewed and stable  Last Vitals:  Vitals Value Taken Time  BP 141/71 12/20/20 1315  Temp    Pulse 69 12/20/20 1318  Resp 19 12/20/20 1318  SpO2 98 % 12/20/20 1318  Vitals shown include unvalidated device data.  Last Pain:  Vitals:   12/20/20 1003  TempSrc:   PainSc: 0-No pain         Complications: No notable events documented.

## 2020-12-20 NOTE — Progress Notes (Signed)
Pharmacy Antibiotic Note  Summer Hawkins is a 86 y.o. female admitted on 12/20/2020 for post-op lumbar wound infection, now s/p I&D. Pharmacy has been consulted for Vancomycin dosing.  Vancomycin given pre-op, 1g on 10/10 @ 1122. Wound cultures taken intra-op and are pending. SCr 1.6 (baseline appears to be ~1.6-1.8 range).   Plan: - Start Vancomycin 1g IV every 48 hours (next dose due 10/12) - Will continue to follow renal function, culture results, LOT, and antibiotic de-escalation plans   Height: 5\' 5"  (165.1 cm) Weight: 90.7 kg (200 lb) IBW/kg (Calculated) : 57  Temp (24hrs), Avg:97.6 F (36.4 C), Min:97.5 F (36.4 C), Max:97.7 F (36.5 C)  Recent Labs  Lab 12/20/20 0930  CREATININE 1.60*    Estimated Creatinine Clearance: 29.1 mL/min (A) (by C-G formula based on SCr of 1.6 mg/dL (H)).    Allergies  Allergen Reactions   Bee Venom Shortness Of Breath, Nausea And Vomiting and Other (See Comments)    Makes the patient feel faint, also   Penicillins Anaphylaxis, Hives, Swelling and Other (See Comments)    Has patient had a PCN reaction causing immediate rash, facial/tongue/throat swelling, SOB or lightheadedness with hypotension: Yes Has patient had a PCN reaction causing severe rash involving mucus membranes or skin necrosis: No Has patient had a PCN reaction that required hospitalization: No Has patient had a PCN reaction occurring within the last 10 years: No If all of the above answers are "NO", then may proceed with Cephalosporin use.    Lisinopril Cough   Zocor [Simvastatin] Other (See Comments)    memory changes   Liraglutide Rash and Other (See Comments)    Rash at injection site    Antimicrobials this admission: Vancomycin 10/10 >> Rocephin 10/10 >>  Dose adjustments this admission: N/a  Microbiology results: 10/10 COVID >> neg 10/10 MRSA PCR >> neg 10/10 Lumbar wound cx (intra-op)  Thank you for allowing pharmacy to be a part of this patient's  care.  Alycia Rossetti, PharmD, BCPS Clinical Pharmacist Clinical phone for 12/20/2020: [phone] 12/20/2020 3:54 PM   **Pharmacist phone directory can now be found on [email or web address] ([email or web address]).  Listed under [place].

## 2020-12-20 NOTE — Op Note (Signed)
Brief history: The patient is an 86 year old white female on whom I performed a lumbar fusion on 11/17/2020.  The patient was readmitted with a UTI and some wound drainage.  She was treated with antibiotics and discharged on Keflex.  She continued to have wound drainage when I saw her in the office.  We changed over to doxycycline but the drainage continued.  She had a mild superficial dehiscence.  I recommended incision and drainage of her wound and placement of a wound VAC.  The patient has decided proceed with that surgery.  Preop diagnosis: Lumbar wound drainage, dehiscence, infection  Postop diagnosis: The same  Procedure: Incision and drainage of wound with placement of wound VAC  Surgeon: Dr. Earle Gell  Assistant: Arnetha Massy, NP  Anesthesia: General tracheal  Estimated blood loss minimal  Specimens: Culture  Drains: Wound VAC  Complications: None  Description of procedure: The patient was brought to the operating room by the anesthesia team.  General endotracheal anesthesia was induced.  The patient's lumbar wound was then prepared with Betadine scrub and Betadine solution.  Sterile drapes were applied.  The patient had a moderately large wound dehiscence superficially to the subcutaneous tissue.  We obtain cultures.  I used a scalpel to cut back the wound edges to more healthy-appearing tissue.  We used suction irrigation to remove some exudate over the paraspinous musculature.  I did not see any obvious infection but I assume it was infected.  The infection/dehiscence did not appear to go below the paraspinous musculature.  We irrigated the wound out with saline solution.  We obtained a stasis with electrocautery.  We then placed a wound VAC.  The drapes were removed.  By report all sponge, instrument, and needle counts were correct at the end of this case.

## 2020-12-20 NOTE — Anesthesia Preprocedure Evaluation (Addendum)
Anesthesia Evaluation  Patient identified by MRN, date of birth, ID band Patient awake    Reviewed: Allergy & Precautions, NPO status , Patient's Chart, lab work & pertinent test results, reviewed documented beta blocker date and time   History of Anesthesia Complications Negative for: history of anesthetic complications  Airway Mallampati: II  TM Distance: >3 FB Neck ROM: Full    Dental  (+) Edentulous Upper, Edentulous Lower   Pulmonary neg pulmonary ROS,  12/20/2020 SARS coronavirus NEG   breath sounds clear to auscultation       Cardiovascular hypertension, Pt. on medications and Pt. on home beta blockers (-) angina+CHF  + dysrhythmias Atrial Fibrillation + pacemaker  Rhythm:Regular Rate:Normal  '19 ECHO: mild LVH, EF 55-60%. Wall motion was normal, no regional wall motion abnormalities.  - Mitral valve: Mildly to moderately calcified annulus. Mildly   thickened leaflets, no stenosis or regurg.    Neuro/Psych    GI/Hepatic negative GI ROS, Neg liver ROS,   Endo/Other  diabetes (glu 137), Insulin DependentHypothyroidism obese  Renal/GU Renal InsufficiencyRenal disease     Musculoskeletal   Abdominal (+) + obese,   Peds  Hematology eliquis   Anesthesia Other Findings   Reproductive/Obstetrics                            Anesthesia Physical Anesthesia Plan  ASA: 3  Anesthesia Plan: General   Post-op Pain Management:    Induction: Intravenous  PONV Risk Score and Plan: 3 and Ondansetron, Dexamethasone and Treatment may vary due to age or medical condition  Airway Management Planned: Oral ETT  Additional Equipment: None  Intra-op Plan:   Post-operative Plan: Extubation in OR  Informed Consent: I have reviewed the patients History and Physical, chart, labs and discussed the procedure including the risks, benefits and alternatives for the proposed anesthesia with the patient or  authorized representative who has indicated his/her understanding and acceptance.     Consent reviewed with POA and Dental advisory given  Plan Discussed with: CRNA and Surgeon  Anesthesia Plan Comments: (Discussed with patient, and also granddaughter by telephone)      Anesthesia Quick Evaluation

## 2020-12-20 NOTE — Anesthesia Postprocedure Evaluation (Signed)
Anesthesia Post Note  Patient: Summer Hawkins  Procedure(s) Performed: LUMBAR WOUND DEBRIDEMENT WITH PLACEMENTOF LUMBAR WOUND VAC     Patient location during evaluation: PACU Anesthesia Type: General Level of consciousness: awake and alert, patient cooperative and oriented Pain management: pain level controlled Vital Signs Assessment: post-procedure vital signs reviewed and stable Respiratory status: spontaneous breathing, nonlabored ventilation and respiratory function stable Cardiovascular status: blood pressure returned to baseline and stable Postop Assessment: no apparent nausea or vomiting Anesthetic complications: no   No notable events documented.  Last Vitals:  Vitals:   12/20/20 1415 12/20/20 1445  BP: 130/61 123/61  Pulse: 70 70  Resp: 17 13  Temp:    SpO2: 91% 92%    Last Pain:  Vitals:   12/20/20 1415  TempSrc:   PainSc: Asleep                 Lashell Moffitt,E. Chanelle Hodsdon

## 2020-12-20 NOTE — H&P (Signed)
Subjective: The patient is an 86 year old white female on whom I  performed an instrumented lumbar fusion on 11/17/2020.  She was readmitted with a UTI.  She was subsequently discharged.  She has had persistent wound drainage.  She been treated with oral cephalexin and doxycycline but the drainage has persisted.  I recommended an incision and drainage of the wound with likely placement of a wound VAC.  The patient has decided to proceed without surgery.  Past Medical History:  Diagnosis Date   Atrial fibrillation ([place])    Breast cancer ([place])    Breast cancer, left ([place])    Chronic kidney disease    stage III - patient was unaware   Diabetes mellitus    Dyspnea    Dysrhythmia    Afib   H/O: hysterectomy    History of colonoscopy 01/25/2010   History of mammogram 08/04/2009   Hyperlipidemia    Hypertension    Hypothyroidism    Ketoacidosis, diabetic, no coma, non-insulin dependent    Type II   Vitamin B12 deficiency     Past Surgical History:  Procedure Laterality Date   ABDOMINAL HYSTERECTOMY     Bilateral foot surgery     BREAST EXCISIONAL BIOPSY Left 12/2015   BREAST LUMPECTOMY Left    2017   BREAST LUMPECTOMY WITH RADIOACTIVE SEED AND SENTINEL LYMPH NODE BIOPSY Left 01/19/2016   Procedure: LEFT BREAST LUMPECTOMY WITH RADIOACTIVE SEED AND SENTINEL LYMPH NODE BIOPSY;  Surgeon: Autumn Messing III, MD;  Location: [facility];  Service: General;  Laterality: Left;   BREAST LUMPECTOMY WITH RADIOACTIVE SEED LOCALIZATION Left 01/19/2016   CARDIOVERSION  02/27/2011   Procedure: CARDIOVERSION;  Surgeon: Loralie Champagne, MD;  Location: [facility];  Service: Cardiovascular;  Laterality: N/A;   LAPAROSCOPIC CHOLECYSTECTOMY     PACEMAKER IMPLANT N/A 12/02/2018   Procedure: PACEMAKER IMPLANT;  Surgeon: Evans Lance, MD;  Location: [facility];  Service: Cardiovascular;  Laterality: N/A;   ROTATOR CUFF REPAIR Left    TUBAL LIGATION      Allergies  Allergen Reactions   Bee Venom Shortness Of Breath,  Nausea And Vomiting and Other (See Comments)    Makes the patient feel faint, also   Penicillins Anaphylaxis, Hives, Swelling and Other (See Comments)    Has patient had a PCN reaction causing immediate rash, facial/tongue/throat swelling, SOB or lightheadedness with hypotension: Yes Has patient had a PCN reaction causing severe rash involving mucus membranes or skin necrosis: No Has patient had a PCN reaction that required hospitalization: No Has patient had a PCN reaction occurring within the last 10 years: No If all of the above answers are "NO", then may proceed with Cephalosporin use.    Lisinopril Cough   Zocor [Simvastatin] Other (See Comments)    memory changes   Liraglutide Rash and Other (See Comments)    Rash at injection site    Social History   Tobacco Use   Smoking status: Never   Smokeless tobacco: Never  Substance Use Topics   Alcohol use: No    Alcohol/week: 0.0 standard drinks    Family History  Problem Relation Age of Onset   Heart failure Father 68       enlarged heart   Pneumonia Mother 34   Diabetes Mother 19   Breast cancer Sister    Cancer Neg Hx    Prior to Admission medications   Medication Sig Start Date End Date Taking? Authorizing Provider  acetaminophen (TYLENOL) 500 MG tablet Take 1,000  mg by mouth every 8 (eight) hours as needed (pain/headache).   Yes [provider]  amLODipine (NORVASC) 5 MG tablet Take 5 mg by mouth in the morning. (1000) 04/29/20  Yes [provider]  anastrozole (ARIMIDEX) 1 MG tablet Take 1 tablet (1 mg total) by mouth daily. 01/06/20  Yes Nicholas Lose, MD  apixaban (ELIQUIS) 2.5 MG TABS tablet Take 1 tablet (2.5 mg total) by mouth 2 (two) times daily. 12/01/20  Yes Ghimire, Henreitta Leber, MD  cyclobenzaprine (FLEXERIL) 10 MG tablet Take 1 tablet (10 mg total) by mouth 3 (three) times daily as needed for muscle spasms. 11/22/20  Yes Viona Gilmore D, NP  docusate sodium (COLACE) 100 MG capsule Take 1 capsule  (100 mg total) by mouth 2 (two) times daily. 11/22/20  Yes Viona Gilmore D, NP  doxycycline (VIBRA-TABS) 100 MG tablet Take 100 mg by mouth in the morning and at bedtime. 12/08/20  Yes [provider]  ferrous sulfate 325 (65 FE) MG tablet Take 325 mg by mouth daily with breakfast. (0830)   Yes [provider]  HYDROcodone-acetaminophen (NORCO/VICODIN) 5-325 MG tablet Take 1 tablet by mouth every 6 (six) hours as needed for moderate pain (for 5 days, last day wil be Monday.).   Yes [provider]  insulin aspart (NOVOLOG) 100 UNIT/ML injection 0-15 Units, Subcutaneous, 3 times daily with meals, CBG < 70: Implement Hypoglycemia measures CBG 70 - 120: 0 units CBG 121 - 150: 2 units CBG 151 - 200: 3 units CBG 201 - 250: 5 units CBG 251 - 300: 8 units CBG 301 - 350: 11 units CBG 351 - 400: 15 units CBG > 400: call MD 12/01/20  Yes Ghimire, Henreitta Leber, MD  insulin detemir (LEVEMIR) 100 UNIT/ML injection Inject 0.12 mLs (12 Units total) into the skin at bedtime. 12/01/20  Yes Ghimire, Henreitta Leber, MD  levothyroxine (SYNTHROID) 137 MCG tablet Take 137 mcg by mouth daily before breakfast. (0630) 07/02/18  Yes [provider]  metFORMIN (GLUCOPHAGE) 850 MG tablet Take 850 mg by mouth 2 (two) times daily with a meal. (1200 & 1800)   Yes [provider]  metoprolol succinate (TOPROL XL) 25 MG 24 hr tablet Take 1 tablet (25 mg total) by mouth daily. 05/07/20 05/07/21 Yes Burnell Blanks, MD  Multiple Vitamins-Minerals (MULTIVITAMIN WITH MINERALS) tablet Take 1 tablet by mouth in the morning. (1000)   Yes [provider]  nystatin cream (MYCOSTATIN) Apply 1 application topically 2 (two) times daily.   Yes [provider]  polyethylene glycol (MIRALAX / GLYCOLAX) 17 g packet Take 17 g by mouth daily at 4 PM. (1700)   Yes [provider]  saccharomyces boulardii (FLORASTOR) 250 MG capsule Take 250 mg by mouth 2 (two) times daily.   Yes [provider]  senna (SENOKOT) 8.6 MG TABS tablet Take 2 tablets by mouth at bedtime. (2200)   Yes [provider]  spironolactone (ALDACTONE) 25 MG tablet Take 25 mg by mouth in the morning. (1000)   Yes [provider]  timolol (TIMOPTIC) 0.5 % ophthalmic solution Place 1 drop into both eyes every morning. (1000) 05/22/20  Yes [provider]  Oxycodone HCl 10 MG TABS Take 1 tablet (10 mg total) by mouth every 6 (six) hours as needed ((score 7 to 10)). Patient not taking: No sig reported 12/01/20   Jonetta Osgood, MD     Review of Systems  Positive ROS: As above  All other systems  have been reviewed and were otherwise negative with the exception of those mentioned in the HPI and as above.  Objective: Vital signs in last 24 hours: Temp:  [97.5 F (36.4 C)] 97.5 F (36.4 C) (10/10 0918) Pulse Rate:  [74] 74 (10/10 0918) Resp:  [18] 18 (10/10 0918) BP: (147)/(74) 147/74 (10/10 0918) SpO2:  [97 %] 97 % (10/10 0918) Weight:  [90.7 kg] 90.7 kg (10/10 0918) Estimated body mass index is 33.28 kg/m as calculated from the following:   Height as of this encounter: 5\' 5"  (1.651 m).   Weight as of this encounter: 90.7 kg.   General Appearance: Alert Head: Normocephalic, without obvious abnormality, atraumatic Eyes: PERRL, conjunctiva/corneas clear, EOM's intact,    Ears: Normal  Throat: Normal  Neck: Supple, Back: The patient's lumbar dressing has some purulent appearing drainage.  She has a wound dehiscence. Lungs: Clear to auscultation bilaterally, respirations unlabored Heart: Regular rate and rhythm, no murmur, rub or gallop Abdomen: Soft, non-tender Extremities: Extremities normal, atraumatic, no cyanosis or edema Skin: unremarkable  NEUROLOGIC:   Mental status: alert and oriented,Motor Exam - grossly normal Sensory Exam - grossly normal Reflexes:  Coordination - grossly normal Gait - grossly normal Balance - grossly normal Cranial  Nerves: I: smell Not tested  II: visual acuity  OS: Normal  OD: Normal   II: visual fields Full to confrontation  II: pupils Equal, round, reactive to light  III,VII: ptosis None  III,IV,VI: extraocular muscles  Full ROM  V: mastication Normal  V: facial light touch sensation  Normal  V,VII: corneal reflex  Present  VII: facial muscle function - upper  Normal  VII: facial muscle function - lower Normal  VIII: hearing Not tested  IX: soft palate elevation  Normal  IX,X: gag reflex Present  XI: trapezius strength  5/5  XI: sternocleidomastoid strength 5/5  XI: neck flexion strength  5/5  XII: tongue strength  Normal    Data Review Lab Results  Component Value Date   WBC 14.9 (H) 11/30/2020   HGB 13.3 12/20/2020   HCT 39.0 12/20/2020   MCV 96.8 11/30/2020   PLT 414 (H) 11/30/2020   Lab Results  Component Value Date   NA 145 12/20/2020   K 4.3 12/20/2020   CL 111 12/20/2020   CO2 21 (L) 11/30/2020   BUN 30 (H) 12/20/2020   CREATININE 1.60 (H) 12/20/2020   GLUCOSE 174 (H) 12/20/2020   Lab Results  Component Value Date   INR 1.4 (H) 11/21/2018    Assessment/Plan: Lumbar wound dehiscence/drainage: I have discussed the situation with the patient.  I recommended an incision and drainage of the wound with likely placement of a wound VAC.  She has decided proceed with surgery.  She will likely also need prolonged IV antibiotics.  I will ask ID to see the patient after surgery.   Summer Hawkins 12/20/2020 11:16 AM

## 2020-12-20 NOTE — Anesthesia Procedure Notes (Signed)
Procedure Name: Intubation Date/Time: 12/20/2020 12:05 PM Performed by: Amadeo Garnet, CRNA Pre-anesthesia Checklist: Patient identified, Emergency Drugs available, Suction available and Patient being monitored Patient Re-evaluated:Patient Re-evaluated prior to induction Oxygen Delivery Method: Circle system utilized Preoxygenation: Pre-oxygenation with 100% oxygen Induction Type: IV induction Ventilation: Mask ventilation without difficulty Laryngoscope Size: Mac and 3 Grade View: Grade I Tube type: Oral Tube size: 7.0 mm Number of attempts: 1 Airway Equipment and Method: Stylet and Oral airway Placement Confirmation: ETT inserted through vocal cords under direct vision, positive ETCO2 and breath sounds checked- equal and bilateral Secured at: 22 cm Tube secured with: Tape Dental Injury: Teeth and Oropharynx as per pre-operative assessment

## 2020-12-21 ENCOUNTER — Encounter (HOSPITAL_COMMUNITY): Payer: Self-pay | Admitting: Neurosurgery

## 2020-12-21 DIAGNOSIS — M549 Dorsalgia, unspecified: Secondary | ICD-10-CM | POA: Diagnosis not present

## 2020-12-21 DIAGNOSIS — Z20822 Contact with and (suspected) exposure to covid-19: Secondary | ICD-10-CM | POA: Diagnosis present

## 2020-12-21 DIAGNOSIS — R410 Disorientation, unspecified: Secondary | ICD-10-CM | POA: Diagnosis not present

## 2020-12-21 DIAGNOSIS — Z853 Personal history of malignant neoplasm of breast: Secondary | ICD-10-CM | POA: Diagnosis not present

## 2020-12-21 DIAGNOSIS — T8131XA Disruption of external operation (surgical) wound, not elsewhere classified, initial encounter: Secondary | ICD-10-CM | POA: Diagnosis present

## 2020-12-21 DIAGNOSIS — E039 Hypothyroidism, unspecified: Secondary | ICD-10-CM | POA: Diagnosis present

## 2020-12-21 DIAGNOSIS — R4182 Altered mental status, unspecified: Secondary | ICD-10-CM | POA: Diagnosis not present

## 2020-12-21 DIAGNOSIS — E119 Type 2 diabetes mellitus without complications: Secondary | ICD-10-CM

## 2020-12-21 DIAGNOSIS — I700 Atherosclerosis of aorta: Secondary | ICD-10-CM | POA: Diagnosis not present

## 2020-12-21 DIAGNOSIS — N189 Chronic kidney disease, unspecified: Secondary | ICD-10-CM | POA: Diagnosis present

## 2020-12-21 DIAGNOSIS — Z8249 Family history of ischemic heart disease and other diseases of the circulatory system: Secondary | ICD-10-CM | POA: Diagnosis not present

## 2020-12-21 DIAGNOSIS — E785 Hyperlipidemia, unspecified: Secondary | ICD-10-CM | POA: Diagnosis present

## 2020-12-21 DIAGNOSIS — Z87440 Personal history of urinary (tract) infections: Secondary | ICD-10-CM | POA: Diagnosis not present

## 2020-12-21 DIAGNOSIS — E669 Obesity, unspecified: Secondary | ICD-10-CM | POA: Diagnosis present

## 2020-12-21 DIAGNOSIS — Z90710 Acquired absence of both cervix and uterus: Secondary | ICD-10-CM | POA: Diagnosis not present

## 2020-12-21 DIAGNOSIS — Z6833 Body mass index (BMI) 33.0-33.9, adult: Secondary | ICD-10-CM | POA: Diagnosis not present

## 2020-12-21 DIAGNOSIS — Z803 Family history of malignant neoplasm of breast: Secondary | ICD-10-CM | POA: Diagnosis not present

## 2020-12-21 DIAGNOSIS — Z7401 Bed confinement status: Secondary | ICD-10-CM | POA: Diagnosis not present

## 2020-12-21 DIAGNOSIS — R29898 Other symptoms and signs involving the musculoskeletal system: Secondary | ICD-10-CM | POA: Diagnosis not present

## 2020-12-21 DIAGNOSIS — Y838 Other surgical procedures as the cause of abnormal reaction of the patient, or of later complication, without mention of misadventure at the time of the procedure: Secondary | ICD-10-CM | POA: Diagnosis present

## 2020-12-21 DIAGNOSIS — I129 Hypertensive chronic kidney disease with stage 1 through stage 4 chronic kidney disease, or unspecified chronic kidney disease: Secondary | ICD-10-CM | POA: Diagnosis present

## 2020-12-21 DIAGNOSIS — Z452 Encounter for adjustment and management of vascular access device: Secondary | ICD-10-CM | POA: Diagnosis not present

## 2020-12-21 DIAGNOSIS — Z833 Family history of diabetes mellitus: Secondary | ICD-10-CM | POA: Diagnosis not present

## 2020-12-21 DIAGNOSIS — B964 Proteus (mirabilis) (morganii) as the cause of diseases classified elsewhere: Secondary | ICD-10-CM | POA: Diagnosis present

## 2020-12-21 DIAGNOSIS — Z950 Presence of cardiac pacemaker: Secondary | ICD-10-CM | POA: Diagnosis not present

## 2020-12-21 DIAGNOSIS — T8189XA Other complications of procedures, not elsewhere classified, initial encounter: Secondary | ICD-10-CM | POA: Diagnosis not present

## 2020-12-21 DIAGNOSIS — T8130XA Disruption of wound, unspecified, initial encounter: Secondary | ICD-10-CM | POA: Diagnosis not present

## 2020-12-21 DIAGNOSIS — T8141XA Infection following a procedure, superficial incisional surgical site, initial encounter: Secondary | ICD-10-CM | POA: Diagnosis present

## 2020-12-21 DIAGNOSIS — R531 Weakness: Secondary | ICD-10-CM | POA: Diagnosis present

## 2020-12-21 DIAGNOSIS — R262 Difficulty in walking, not elsewhere classified: Secondary | ICD-10-CM | POA: Diagnosis present

## 2020-12-21 DIAGNOSIS — E1122 Type 2 diabetes mellitus with diabetic chronic kidney disease: Secondary | ICD-10-CM | POA: Diagnosis present

## 2020-12-21 LAB — GLUCOSE, CAPILLARY
Glucose-Capillary: 195 mg/dL — ABNORMAL HIGH (ref 70–99)
Glucose-Capillary: 184 mg/dL — ABNORMAL HIGH (ref 70–99)
Glucose-Capillary: 202 mg/dL — ABNORMAL HIGH (ref 70–99)
Glucose-Capillary: 182 mg/dL — ABNORMAL HIGH (ref 70–99)

## 2020-12-21 LAB — CBC
HCT: 35.1 % — ABNORMAL LOW (ref 36.0–46.0)
Hemoglobin: 10.7 g/dL — ABNORMAL LOW (ref 12.0–15.0)
MCH: 28.9 pg (ref 26.0–34.0)
MCHC: 30.5 g/dL (ref 30.0–36.0)
MCV: 94.9 fL (ref 80.0–100.0)
Platelets: 373 10*3/uL (ref 150–400)
RBC: 3.7 MIL/uL — ABNORMAL LOW (ref 3.87–5.11)
RDW: 14.5 % (ref 11.5–15.5)
WBC: 10 10*3/uL (ref 4.0–10.5)
nRBC: 0 % (ref 0.0–0.2)

## 2020-12-21 LAB — BASIC METABOLIC PANEL
Anion gap: 11 (ref 5–15)
BUN: 26 mg/dL — ABNORMAL HIGH (ref 8–23)
CO2: 20 mmol/L — ABNORMAL LOW (ref 22–32)
Calcium: 9.3 mg/dL (ref 8.9–10.3)
Chloride: 110 mmol/L (ref 98–111)
Creatinine, Ser: 1.57 mg/dL — ABNORMAL HIGH (ref 0.44–1.00)
GFR, Estimated: 32 mL/min — ABNORMAL LOW (ref >60)
Glucose, Bld: 176 mg/dL — ABNORMAL HIGH (ref 70–99)
Potassium: 3.5 mmol/L (ref 3.5–5.1)
Sodium: 141 mmol/L (ref 135–145)

## 2020-12-21 LAB — SEDIMENTATION RATE: Sed Rate: 35 mm/hr — ABNORMAL HIGH (ref 0–22)

## 2020-12-21 LAB — C-REACTIVE PROTEIN: CRP: 0.9 mg/dL (ref <1.0)

## 2020-12-21 MED ORDER — LEVOTHYROXINE SODIUM 25 MCG PO TABS
137.0000 ug | ORAL_TABLET | Freq: Every day | ORAL | Status: DC
  Administered 2020-12-22 – 2020-12-27 (×6): 137 ug via ORAL
  Filled 2020-12-21 (×7): qty 1

## 2020-12-21 MED ORDER — METOPROLOL SUCCINATE ER 25 MG PO TB24
25.0000 mg | ORAL_TABLET | Freq: Every day | ORAL | Status: DC
  Administered 2020-12-21 – 2020-12-27 (×7): 25 mg via ORAL
  Filled 2020-12-21 (×7): qty 1

## 2020-12-21 MED ORDER — ACETAMINOPHEN 500 MG PO TABS
1000.0000 mg | ORAL_TABLET | Freq: Four times a day (QID) | ORAL | Status: AC
  Administered 2020-12-21 – 2020-12-22 (×4): 1000 mg via ORAL
  Filled 2020-12-21 (×4): qty 2

## 2020-12-21 MED ORDER — TIMOLOL MALEATE 0.5 % OP SOLN
1.0000 [drp] | Freq: Every day | OPHTHALMIC | Status: DC
  Administered 2020-12-22 – 2020-12-27 (×5): 1 [drp] via OPHTHALMIC
  Filled 2020-12-21 (×3): qty 5

## 2020-12-21 MED ORDER — INSULIN ASPART 100 UNIT/ML IJ SOLN
0.0000 [IU] | Freq: Three times a day (TID) | INTRAMUSCULAR | Status: DC
Start: 2020-12-21 — End: 2020-12-28
  Administered 2020-12-21: 3 [IU] via SUBCUTANEOUS
  Administered 2020-12-22: 2 [IU] via SUBCUTANEOUS
  Administered 2020-12-22: 5 [IU] via SUBCUTANEOUS
  Administered 2020-12-22: 2 [IU] via SUBCUTANEOUS
  Administered 2020-12-23 (×2): 3 [IU] via SUBCUTANEOUS
  Administered 2020-12-23: 2 [IU] via SUBCUTANEOUS
  Administered 2020-12-24: 5 [IU] via SUBCUTANEOUS
  Administered 2020-12-24: 2 [IU] via SUBCUTANEOUS
  Administered 2020-12-24 – 2020-12-25 (×2): 1 [IU] via SUBCUTANEOUS
  Administered 2020-12-25: 3 [IU] via SUBCUTANEOUS
  Administered 2020-12-25: 1 [IU] via SUBCUTANEOUS
  Administered 2020-12-26: 5 [IU] via SUBCUTANEOUS
  Administered 2020-12-26: 1 [IU] via SUBCUTANEOUS
  Administered 2020-12-26: 2 [IU] via SUBCUTANEOUS
  Administered 2020-12-27 (×2): 3 [IU] via SUBCUTANEOUS
  Administered 2020-12-27: 2 [IU] via SUBCUTANEOUS

## 2020-12-21 NOTE — Progress Notes (Signed)
Subjective: The patient is alert and pleasant.  She looks well.  She has no complaints.  Objective: Vital signs in last 24 hours: Temp:  [97.5 F (36.4 C)-98.1 F (36.7 C)] 98.1 F (36.7 C) (10/11 0725) Pulse Rate:  [66-76] 71 (10/11 0725) Resp:  [10-20] 16 (10/11 0725) BP: (123-149)/(59-75) 128/63 (10/11 0725) SpO2:  [91 %-100 %] 100 % (10/11 0725) Weight:  [90.7 kg] 90.7 kg (10/10 0918) Estimated body mass index is 33.28 kg/m as calculated from the following:   Height as of this encounter: _0  (1.651 m).   Weight as of this encounter: 90.7 kg.   Intake/Output from previous day: 10/10 0701 - 10/11 0700 In: 1000 [I.V.:1000] Out: 20 [Blood:20] Intake/Output this shift: No intake/output data recorded.  Physical exam the patient is alert and pleasant.  Her strength is normal.  Lab Results: Recent Labs    12/20/20 0930 12/21/20 0551  WBC  --  10.0  HGB 13.3 10.7*  HCT 39.0 35.1*  PLT  --  373   BMET Recent Labs    12/20/20 0930 12/21/20 0551  NA 145 141  K 4.3 3.5  CL 111 110  CO2  --  20*  GLUCOSE 174* 176*  BUN 30* 26*  CREATININE 1.60* 1.57*  CALCIUM  --  9.3    Studies/Results: No results found.  Assessment/Plan: Postop day #1: I have discussed the situation with the patient.  Although I did not see an obvious infection, I certainly suspect that is the reason why she did not heal properly and had continued drainage.  We will get blood cultures, ESR and sed rate in anticipation of placing a PICC line for prolonged IV antibiotics at the direction of ID.  LOS: 1 day     [name] 12/21/2020, 7:44 AM     Patient ID: Summer Hawkins, female   DOB: 01-24-1935, 86 y.o.   MRN: [id number]

## 2020-12-21 NOTE — Progress Notes (Signed)
Physical Therapy Evaluation  Assessment: Pt admitted with above diagnosis. At the time of PT eval, pt was able to demonstrate transfers and side stepping at EOB with up tp +2 max assist and RW for support. Pt pleasant and agreeable to work with PT/OT, however effort decreased as session progressed. Pt requires increased encouragement for optimal therapeutic effort. Pt was educated on precautions throughout session. Pt currently with functional limitations due to the deficits listed below (see PT Problem List). Pt will benefit from skilled PT to increase their independence and safety with mobility to allow discharge to the venue listed below.     12/21/20 1024  PT Visit Information  Last PT Received On 12/21/20  Assistance Needed +2  History of Present Illness Pt is an 86 y/o female who presents with persistent wound drainge since lumbar fusion on 11/17/20. Pt underwent I&D and palcement of wound vac on 10/10. Pt also with recent admission due to UTI. PMH: breast cancer, CKD III, DM, HTN, pacemaker, RC repair.  Precautions  Precautions Fall;Back  Precaution Booklet Issued Yes (comment)  Precaution Comments Reviewed precautions verbally during functional mobility. Handout provided.  Spinal Brace  (No brace needed order)  Restrictions  Weight Bearing Restrictions No  Home Living  Family/patient expects to be discharged to: Skilled nursing facility  Prior Function  Level of Independence Needs assistance  Gait / Transfers Assistance Needed At SNF, requires assist with all transfers, only able to tolerate standing and/or a few steps of ambulation before needing to sit.  ADL's / [place] mod-max A +2 assist with all ADL's  Communication / Swallowing Assistance Needed None  Comments Prior to back surgery, pt was very limited by pain, but was living alone independently and able to take care of herself.  Communication  Communication No difficulties  Pain Assessment  Pain  Assessment 0-10  Pain Score 3  Pain Location back/incision site  Pain Descriptors / Indicators Operative site guarding;Sore  Cognition  Arousal/Alertness Awake/alert  Behavior During Therapy WFL for tasks assessed/performed  Overall Cognitive Status No family/caregiver present to determine baseline cognitive functioning  Area of Impairment Memory;Following commands  Memory Decreased recall of precautions  Following Commands Follows one step commands with increased time  Lower Extremity Assessment  Lower Extremity Assessment Generalized weakness  Cervical / Trunk Assessment  Cervical / Trunk Assessment Other exceptions  Cervical / Trunk Exceptions s/p lumbar fusion and now I&D of lumbar wound  Bed Mobility  Overal bed mobility Needs Assistance  Bed Mobility Rolling;Sidelying to Sit;Sit to Supine  Rolling Mod assist  Sidelying to sit Mod assist;+2 for physical assistance  Sit to supine Max assist;+2 for physical assistance  General bed mobility comments +2 assist for bed mobility and maintaining precautions/performing log roll technique. HOB flat.  Transfers  Overall transfer level Needs assistance  Equipment used Rolling walker (2 wheeled)  Transfers Sit to/from Stand  Sit to Stand Max assist;+2 physical assistance;+2 safety/equipment;From elevated surface  General transfer comment +2 mod assist for first attempt and +2 max assist for second attempt. VC's for hand placement on seated surface for safety.  Ambulation/Gait  Ambulation/Gait assistance Mod assist;+2 safety/equipment  Gait Distance (Feet) 3 Feet  Assistive device Rolling walker (2 wheeled)  Gait Pattern/deviations Step-to pattern;Decreased stride length;Trunk flexed  General Gait Details Pt was able to take a few side steps at EOB to reposition towards HOB prior to return to supine. Assist for balance and walker management.  Gait velocity Decreased  Gait velocity interpretation <  1.31 ft/sec, indicative of household  ambulator  Balance  Overall balance assessment Needs assistance  Sitting-balance support Bilateral upper extremity supported;Feet supported  Sitting balance-Leahy Scale Poor  Sitting balance - Comments External assist required in addition to UE support to maintain static sitting  Standing balance support Bilateral upper extremity supported  Standing balance-Leahy Scale Zero  Standing balance comment +2 assist required  PT - End of Session  Equipment Utilized During Treatment Gait belt  Activity Tolerance Patient limited by fatigue (weakness)  Patient left in bed;with call bell/phone within reach  Nurse Communication Mobility status  PT Assessment  PT Recommendation/Assessment Patient needs continued PT services  PT Visit Diagnosis Muscle weakness (generalized) (M62.81);Unsteadiness on feet (R26.81);Other abnormalities of gait and mobility (R26.89);Pain  Pain - part of body  (Back)  PT Problem List Decreased strength;Decreased mobility;Decreased balance;Decreased activity tolerance;Pain;Decreased cognition;Decreased knowledge of use of DME;Decreased safety awareness  Barriers to Discharge Decreased caregiver support  PT Plan  PT Frequency (ACUTE ONLY) Min 2X/week  PT Treatment/Interventions (ACUTE ONLY) DME instruction;Gait training;Stair training;Functional mobility training;Therapeutic activities;Therapeutic exercise;Balance training;Neuromuscular re-education;Patient/family education  AM-PAC PT "6 Clicks" Mobility Outcome Measure (Version 2)  Help needed turning from your back to your side while in a flat bed without using bedrails? 2  Help needed moving from lying on your back to sitting on the side of a flat bed without using bedrails? 1  Help needed moving to and from a bed to a chair (including a wheelchair)? 1  Help needed standing up from a chair using your arms (e.g., wheelchair or bedside chair)? 1  Help needed to walk in hospital room? 1  Help needed climbing 3-5 steps with  a railing?  1  6 Click Score 7  Consider Recommendation of Discharge To: CIR/SNF/LTACH  Progressive Mobility  What is the highest level of mobility based on the progressive mobility assessment? Level 3 (Stands with assist) - Balance while standing  and cannot march in place  Mobility Sit up in bed/chair position for meals  PT Recommendation  Follow Up Recommendations SNF;Supervision/Assistance - 24 hour  PT equipment Other (comment) (TBD by next venue of care)  Individuals Consulted  Consulted and Agree with Results and Recommendations Patient  Acute Rehab PT Goals  Patient Stated Goal To eventually start walking again and return home  PT Goal Formulation With patient  Time For Goal Achievement 01/04/21  Potential to Achieve Goals Fair  PT Time Calculation  PT Start Time (ACUTE ONLY) 0940  PT Stop Time (ACUTE ONLY) 1003  PT Time Calculation (min) (ACUTE ONLY) 23 min  PT General Charges  $$ ACUTE PT VISIT 1 Visit  PT Evaluation  $PT Eval Moderate Complexity 1 Mod  Written Expression  Dominant Hand Right   Rolinda Roan, PT, DPT Acute Rehabilitation Services Pager: [phone] Office: [phone]

## 2020-12-21 NOTE — Evaluation (Addendum)
Occupational Therapy Evaluation Patient Details Name: Summer Hawkins MRN: [id number] DOB: Jun 13, 1934 Today's Date: 12/21/2020   History of Present Illness Pt is an 86 y/o female who presents with persistent wound drainge since lumbar fusion on 11/17/20. Pt underwent I&D and palcement of wound vac on 10/10. Pt also with recent admission due to UTI. PMH: breast cancer, CKD, DM, HTN   Clinical Impression   PTA, pt from SNF rehab since recent hospitalizations and reports significant assist still needed to transfer and complete ADLs. Prior to initial hospitalization 8/22, pt reports Modified Independence with ADLs, IADLs and mobility. Today, pt presents with pain improvements though limited by deficits in standing balance, strength and endurance. Pt able to standing and sidesteps at bedside using RW and with Mod-Max A x 2 though unable to sustain this task. Pt requires Min A for UB ADLs and up to Total A x 2 for LB ADLs (if in standing). Pt with desire to be able to walk again and care for self, so recommend DC back to SNF rehab to maximize independence.       Recommendations for follow up therapy are one component of a multi-disciplinary discharge planning process, led by the attending physician.  Recommendations may be updated based on patient status, additional functional criteria and insurance authorization.   Follow Up Recommendations  SNF;Supervision/Assistance - 24 hour    Equipment Recommendations  None recommended by OT    Recommendations for Other Services       Precautions / Restrictions Precautions Precautions: Fall;Back Precaution Booklet Issued: Yes (comment) Precaution Comments: no brace needed per orders, lumbar wound vac Weight Bearing Restrictions: No      Mobility Bed Mobility Overal bed mobility: Needs Assistance Bed Mobility: Rolling;Sidelying to Sit;Sit to Sidelying Rolling: Mod assist Sidelying to sit: Mod assist;+2 for safety/equipment   Sit to supine: Max  assist;+2 for safety/equipment;+2 for physical assistance Sit to sidelying: Max assist;+2 for safety/equipment General bed mobility comments: cues for log rolling with assist needed to lift LEs, bring to edge and lift trunk - cues to use bed rails. Increased assist needed to get back into bed to lift LEs and cues to avoid twisting spine    Transfers Overall transfer level: Needs assistance Equipment used: Rolling walker (2 wheeled) Transfers: Sit to/from Stand Sit to Stand: Max assist;+2 physical assistance;+2 safety/equipment;From elevated surface         General transfer comment: Initial sit to stand Mod A x 2 with RW, cues for hand placement and to bring B feet back. Increased assist needed on second attempt with Max A x 2 to power up and begin side steps at bedside    Balance Overall balance assessment: Needs assistance Sitting-balance support: Bilateral upper extremity supported;Feet supported Sitting balance-Leahy Scale: Fair Sitting balance - Comments: Pt requiring cues to lean forward as she leans posteriorly significantly at times. Pt minguard to max assist to sit eOB   Standing balance support: Bilateral upper extremity supported Standing balance-Leahy Scale: Poor Standing balance comment: reliant on UE support and external support                           ADL either performed or assessed with clinical judgement   ADL Overall ADL's : Needs assistance/impaired Eating/Feeding: Set up;Sitting   Grooming: Set up;Sitting   Upper Body Bathing: Minimal assistance;Sitting   Lower Body Bathing: Total assistance;+2 for physical assistance;+2 for safety/equipment;Sit to/from stand   Upper Body Dressing : Minimal  assistance;Sitting   Lower Body Dressing: Total assistance;+2 for safety/equipment;+2 for physical assistance;Sit to/from stand       Toileting- Water quality scientist and Hygiene: Total assistance;Bed level Toileting - Clothing Manipulation Details  (indicate cue type and reason): per nursing, pt felt urge to use bathroom but instead had BM in pull up brief       General ADL Comments: Demo some insight into deficits but benefits from encouragement in attempting tasks, limited by anxiety more than pain. Able to demo standing and initial side steps at bedside with RW and +2 assist which sounds like more than pt had previously been able to do with SNF rehab     Vision Baseline Vision/History: 1 Wears glasses Ability to See in Adequate Light: 1 Impaired Patient Visual Report: No change from baseline Vision Assessment?: No apparent visual deficits     Perception     Praxis      Pertinent Vitals/Pain Pain Assessment: 0-10 Pain Score: 3  Faces Pain Scale: Hurts even more Pain Location: low back Pain Descriptors / Indicators: Grimacing Pain Intervention(s): Monitored during session     Hand Dominance Right   Extremity/Trunk Assessment Upper Extremity Assessment Upper Extremity Assessment: Generalized weakness   Lower Extremity Assessment Lower Extremity Assessment: Defer to PT evaluation   Cervical / Trunk Assessment Cervical / Trunk Assessment: Other exceptions Cervical / Trunk Exceptions: wound vac to lumbar region   Communication Communication Communication: No difficulties   Cognition Arousal/Alertness: Awake/alert Behavior During Therapy: WFL for tasks assessed/performed;Anxious Overall Cognitive Status: No family/caregiver present to determine baseline cognitive functioning Area of Impairment: Memory;Following commands;Safety/judgement;Awareness;Problem solving                 Orientation Level: Disoriented to;Time   Memory: Decreased recall of precautions;Decreased short-term memory Following Commands: Follows one step commands with increased time Safety/Judgement: Decreased awareness of safety;Decreased awareness of deficits Awareness: Intellectual;Emergent Problem Solving: Slow processing;Decreased  initiation;Difficulty sequencing;Requires verbal cues;Requires tactile cues General Comments: Pt with some memory deficits in recalling back precautions (2/3) and timeline for rehab stays but able to follow all directions well. Noted some anxiety with standing attempts limiting pt but benefits from encouragement. demo some insight into deficits and situation - need for more rehab to be able to go home safely   General Comments       Exercises     Shoulder Instructions      Home Living Family/patient expects to be discharged to:: Skilled nursing facility Living Arrangements: Alone Available Help at Discharge: Friend(s);Available PRN/intermittently Type of Home: House Home Access: Stairs to enter [employer] of Steps: 2 Entrance Stairs-Rails: Left Home Layout: One level     Bathroom Shower/Tub: Walk-in shower;Tub/shower unit   Bathroom Toilet: Handicapped height     Home Equipment: Walker - 2 wheels;Grab bars - tub/shower;Shower seat;Hand held shower head   Additional Comments: Reports husband recently died a few months ago      Prior Functioning/Environment Level of Independence: Needs assistance  Gait / Transfers Assistance Needed: At SNF, requires assist with all transfers, unable to stand for long per pt ADL's / Homemaking Assistance Needed: Gets mod-max A +2 assist with all ADL's Communication / Swallowing Assistance Needed: None Comments: Prior to back surgery, pt was very limited by pain, but was living alone and able to take care of herself.        OT Problem List: Decreased strength;Decreased range of motion;Decreased activity tolerance;Impaired balance (sitting and/or standing);Decreased knowledge of precautions;Pain;Decreased cognition;Decreased safety awareness  OT Treatment/Interventions: Self-care/ADL training;Therapeutic exercise;Energy conservation;DME and/or AE instruction;Therapeutic activities;Patient/family education    OT Goals(Current  goals can be found in the care plan section) Acute Rehab OT Goals Patient Stated Goal: finish rehab, be able to walk again, take care of myself and do everything I was doinig before OT Goal Formulation: With patient Time For Goal Achievement: 01/04/21 Potential to Achieve Goals: Fair  OT Frequency: Min 2X/week   Barriers to D/C:            Co-evaluation PT/OT/SLP Co-Evaluation/Treatment: Yes Reason for Co-Treatment: For patient/therapist safety;To address functional/ADL transfers   OT goals addressed during session: ADL's and self-care      AM-PAC OT "6 Clicks" Daily Activity     Outcome Measure Help from another person eating meals?: A Little Help from another person taking care of personal grooming?: A Little Help from another person toileting, which includes using toliet, bedpan, or urinal?: Total Help from another person bathing (including washing, rinsing, drying)?: A Lot Help from another person to put on and taking off regular upper body clothing?: A Little Help from another person to put on and taking off regular lower body clothing?: Total 6 Click Score: 13   End of Session Equipment Utilized During Treatment: Gait belt;Rolling walker Nurse Communication: Mobility status  Activity Tolerance: Patient tolerated treatment well Patient left: in bed;with call bell/phone within reach;with bed alarm set  OT Visit Diagnosis: Unsteadiness on feet (R26.81);Other abnormalities of gait and mobility (R26.89);Muscle weakness (generalized) (M62.81);Pain Pain - part of body:  (back)                Time: 0940-1005 OT Time Calculation (min): 25 min Charges:  OT General Charges $OT Visit: 1 Visit OT Evaluation $OT Eval Moderate Complexity: 1 Mod  Malachy Chamber, OTR/L Acute Rehab Services Office: [phone]   Layla Maw 12/21/2020, 10:30 AM

## 2020-12-21 NOTE — Progress Notes (Signed)
OT Cancellation Note  Patient Details Name: Summer Hawkins MRN: [id number] DOB: 1934-10-19   Cancelled Treatment:    Reason Eval/Treat Not Completed: Other (comment) On initial attempt around 645AM, pt eating breakfast. Re-attempted around 820AM with IV team entering to place IV and lab entering currently to draw blood. Will follow-up for OT eval as pt available.   Layla Maw 12/21/2020, 8:39 AM

## 2020-12-22 LAB — GLUCOSE, CAPILLARY
Glucose-Capillary: 189 mg/dL — ABNORMAL HIGH (ref 70–99)
Glucose-Capillary: 254 mg/dL — ABNORMAL HIGH (ref 70–99)
Glucose-Capillary: 170 mg/dL — ABNORMAL HIGH (ref 70–99)
Glucose-Capillary: 165 mg/dL — ABNORMAL HIGH (ref 70–99)

## 2020-12-22 MED ORDER — INSULIN GLARGINE-YFGN 100 UNIT/ML ~~LOC~~ SOLN
12.0000 [IU] | Freq: Every day | SUBCUTANEOUS | Status: DC
  Administered 2020-12-22 – 2020-12-26 (×5): 12 [IU] via SUBCUTANEOUS
  Filled 2020-12-22 (×8): qty 0.12

## 2020-12-22 MED ORDER — APIXABAN 2.5 MG PO TABS
2.5000 mg | ORAL_TABLET | Freq: Two times a day (BID) | ORAL | Status: DC
  Administered 2020-12-22 – 2020-12-27 (×11): 2.5 mg via ORAL
  Filled 2020-12-22 (×13): qty 1

## 2020-12-22 MED ORDER — AMLODIPINE BESYLATE 5 MG PO TABS
5.0000 mg | ORAL_TABLET | Freq: Every day | ORAL | Status: DC
  Administered 2020-12-22 – 2020-12-27 (×6): 5 mg via ORAL
  Filled 2020-12-22 (×6): qty 1

## 2020-12-22 NOTE — NC FL2 (Signed)
[place] MEDICAID FL2 LEVEL OF CARE SCREENING TOOL     IDENTIFICATION  Patient Name: Summer Hawkins Birthdate: 03-Jul-1934 Sex: female Admission Date (Current Location): 12/20/2020  [facility] and [place] Number:  Herbalist and Address:  [facility] [place] [facility]. [facility], [place] [address] [place], [place] [address]      Provider Number: [id number]  Attending Physician Name and Address:  Newman Pies, MD  Relative Name and Phone Number:  Havery Moros   [phone]    Current Level of Care: Hospital Recommended Level of Care: [place] Prior Approval Number:    Date Approved/Denied:   PASRR Number: [id number]  Discharge Plan: SNF    Current Diagnoses: Patient Active Problem List   Diagnosis Date Noted   Postoperative complication of skin involving drainage from surgical wound 12/20/2020   Sepsis ([place]) 11/26/2020   Back pain with history of spinal surgery 11/26/2020   Gram-negative bacteremia 11/26/2020   Sepsis due to urinary tract infection ([place]) 68/37/2902   Acute metabolic encephalopathy 01/26/5207   Type 2 diabetes mellitus without complication, with long-term current use of insulin ([place]) 11/25/2020   Hypothyroidism 11/25/2020   CKD (chronic kidney disease), stage III ([place]) 11/25/2020   Spondylolisthesis, lumbar region 11/17/2020   Posterior capsular opacification, left 03/31/2020   Posterior vitreous detachment of both eyes 03/31/2020   Intermediate stage nonexudative age-related macular degeneration of both eyes 09/25/2019   Moderate nonproliferative diabetic retinopathy of both eyes ([place]) 09/25/2019   Pacemaker 03/20/2019   Symptomatic bradycardia 11/30/2018   Syncope and collapse 11/21/2018   Bradycardia 09/25/2017   Permanent atrial fibrillation (H[facility])    Chronic diastolic CHF (congestive heart failure) ([place]) 08/08/2017   Essential hypertension 08/08/2017   Breast cancer of upper-outer quadrant of left female  breast ([place]) 01/05/2016   Chronic atrial fibrillation ([place]) 01/05/2011    Orientation RESPIRATION BLADDER Height & Weight     Self, Time, Situation, Place  Normal Incontinent Weight: 200 lb (90.7 kg) Height:  5\' 5"  (165.1 cm)  BEHAVIORAL SYMPTOMS/MOOD NEUROLOGICAL BOWEL NUTRITION STATUS      Continent Diet (see DC summary)  AMBULATORY STATUS COMMUNICATION OF NEEDS Skin   Limited Assist Verbally Surgical wounds                       Personal Care Assistance Level of Assistance    Bathing Assistance: Maximum assistance (total) Feeding assistance: Limited assistance Dressing Assistance: Maximum assistance (total)     Functional Limitations Info    Sight Info: Adequate Hearing Info: Adequate Speech Info: Adequate    SPECIAL CARE FACTORS FREQUENCY  PT (By licensed PT), OT (By licensed OT)     PT Frequency: 5x week OT Frequency: 5x week            Contractures Contractures Info: Not present    Additional Factors Info  Insulin Sliding Scale Code Status Info: DNR Allergies Info: Bee Venom, Penicillins, Lisinopril, Zocor (Simvastatin), Liraglutide   Insulin Sliding Scale Info: Novolog, 0-9 units 3x day with meals.  See discharge summary.       Current Medications (12/22/2020):  This is the current hospital active medication list Current Facility-Administered Medications  Medication Dose Route Frequency Provider Last Rate Last Admin   0.9 %  sodium chloride infusion  250 mL Intravenous Continuous Bergman, Meghan D, NP 1 mL/hr at 12/20/20 1629 250 mL at 12/20/20 1629   acetaminophen (TYLENOL) tablet 1,000 mg  1,000 mg Oral Q6H Newman Pies, MD  1,000 mg at 12/22/20 0457   bisacodyl (DULCOLAX) suppository 10 mg  10 mg Rectal Daily PRN Viona Gilmore D, NP       cefTRIAXone (ROCEPHIN) 2 g in sodium chloride 0.9 % 100 mL IVPB  2 g Intravenous Q24H Viona Gilmore D, NP 200 mL/hr at 12/21/20 0928 2 g at 12/21/20 0272   cyclobenzaprine (FLEXERIL) tablet 10 mg  10  mg Oral TID PRN Viona Gilmore D, NP       docusate sodium (COLACE) capsule 100 mg  100 mg Oral BID Viona Gilmore D, NP   100 mg at 12/21/20 2151   insulin aspart (novoLOG) injection 0-9 Units  0-9 Units Subcutaneous TID WC Viona Gilmore D, NP   3 Units at 12/21/20 1755   levothyroxine (SYNTHROID) tablet 137 mcg  137 mcg Oral Q0600 Viona Gilmore D, NP   137 mcg at 12/22/20 0457   menthol-cetylpyridinium (CEPACOL) lozenge 3 mg  1 lozenge Oral PRN Viona Gilmore D, NP       Or   phenol (CHLORASEPTIC) mouth spray 1 spray  1 spray Mouth/Throat PRN Viona Gilmore D, NP       metoprolol succinate (TOPROL-XL) 24 hr tablet 25 mg  25 mg Oral Daily Bergman, Meghan D, NP   25 mg at 12/21/20 1258   morphine 4 MG/ML injection 4 mg  4 mg Intravenous Q2H PRN Bergman, Meghan D, NP       ondansetron (ZOFRAN) tablet 4 mg  4 mg Oral Q6H PRN Bergman, Meghan D, NP       Or   ondansetron (ZOFRAN) injection 4 mg  4 mg Intravenous Q6H PRN Bergman, Meghan D, NP       oxyCODONE (Oxy IR/ROXICODONE) immediate release tablet 10 mg  10 mg Oral Q3H PRN Bergman, Meghan D, NP       oxyCODONE (Oxy IR/ROXICODONE) immediate release tablet 5 mg  5 mg Oral Q3H PRN Bergman, Meghan D, NP       polyethylene glycol (MIRALAX / GLYCOLAX) packet 17 g  17 g Oral Daily PRN Bergman, Meghan D, NP       sodium chloride flush (NS) 0.9 % injection 3 mL  3 mL Intravenous Q12H Bergman, Meghan D, NP   3 mL at 12/21/20 1300   sodium chloride flush (NS) 0.9 % injection 3 mL  3 mL Intravenous PRN Bergman, Meghan D, NP       timolol (TIMOPTIC) 0.5 % ophthalmic solution 1 drop  1 drop Both Eyes Daily Bergman, Meghan D, NP       vancomycin (VANCOCIN) IVPB 1000 mg/200 mL premix  1,000 mg Intravenous Q48H Rolla Flatten, RPH       zolpidem (AMBIEN) tablet 5 mg  5 mg Oral QHS PRN Viona Gilmore D, NP         Discharge Medications: Please see discharge summary for a list of discharge medications.  Relevant Imaging Results:  Relevant Lab  Results:   Additional Information SSN #[id number]. Pt has been vaccinated for covid with one booster.  [name], LCSW

## 2020-12-22 NOTE — Progress Notes (Signed)
Subjective: The patient is alert and pleasant.  She has no complaints.  She wants to go home.  She ambulated with PT.  Objective: Vital signs in last 24 hours: Temp:  [97.7 F (36.5 C)-98.4 F (36.9 C)] 97.9 F (36.6 C) (10/12 0751) Pulse Rate:  [69-71] 69 (10/12 0751) Resp:  [16-20] 16 (10/12 0751) BP: (126-149)/(56-86) 149/63 (10/12 0751) SpO2:  [99 %-100 %] 100 % (10/12 0751) Estimated body mass index is 33.28 kg/m as calculated from the following:   Height as of this encounter: 5\' 5"  (1.651 m).   Weight as of this encounter: 90.7 kg.   Intake/Output from previous day: 10/11 0701 - 10/12 0700 In: 240 [P.O.:240] Out: -  Intake/Output this shift: No intake/output data recorded.  Physical exam the patient is alert and oriented.  Her strength is grossly normal.  The patient's cultures are negative so far.  The patient C-reactive protein was normal.  Her sed rate was mildly elevated at 35.  Lab Results: Recent Labs    12/20/20 0930 12/21/20 0551  WBC  --  10.0  HGB 13.3 10.7*  HCT 39.0 35.1*  PLT  --  373   BMET Recent Labs    12/20/20 0930 12/21/20 0551  NA 145 141  K 4.3 3.5  CL 111 110  CO2  --  20*  GLUCOSE 174* 176*  BUN 30* 26*  CREATININE 1.60* 1.57*  CALCIUM  --  9.3    Studies/Results: No results found.  Assessment/Plan: Wound dehiscence/drainage: I appreciate IDs input.  She will likely need a PICC line and prolonged IV antibiotics.  LOS: 2 days     Summer Hawkins 12/22/2020, 8:46 AM     Patient ID: Summer Hawkins, female   DOB: [birth year], 86 y.o.   MRN: [id number]

## 2020-12-22 NOTE — Progress Notes (Signed)
Physical Therapy Treatment Patient Details Name: Summer Hawkins MRN: [id number] DOB: Aug 12, 1934 Today's Date: 12/22/2020   History of Present Illness Pt is an 86 y/o female who presents with persistent wound drainge since lumbar fusion on 11/17/20. Pt underwent I&D and palcement of wound vac on 10/10. Pt also with recent admission due to UTI. PMH: breast cancer, CKD, DM, HTN    PT Comments    Pt progressing slowly towards physical therapy goals. She was able to demonstrate transfers and ambulation with up to +2 total assist as pt fatigued. Reinforced precautions throughout functional mobility. Will continue to follow.      Recommendations for follow up therapy are one component of a multi-disciplinary discharge planning process, led by the attending physician.  Recommendations may be updated based on patient status, additional functional criteria and insurance authorization.  Follow Up Recommendations  SNF;Supervision/Assistance - 24 hour     Equipment Recommendations  Other (comment) (TBD by next venue of care)    Recommendations for Other Services       Precautions / Restrictions Precautions Precautions: Fall;Back Precaution Booklet Issued: Yes (comment) Precaution Comments: no brace needed per orders, lumbar wound vac Required Braces or Orthoses: Spinal Brace Spinal Brace:  (No brace needed order) Restrictions Weight Bearing Restrictions: No     Mobility  Bed Mobility Overal bed mobility: Needs Assistance Bed Mobility: Rolling;Sidelying to Sit;Sit to Sidelying Rolling: Min assist Sidelying to sit: Mod assist     Sit to sidelying: Max assist;+2 for safety/equipment General bed mobility comments: cues for log rolling with assist needed to lift LEs, bring to edge and lift trunk - cues to use bed rails. Increased assist needed to get back into bed to lift LEs and cues to avoid twisting spine    Transfers Overall transfer level: Needs assistance   Transfers: Sit to/from  Stand Sit to Stand: Max assist;Total assist;+2 physical assistance;From elevated surface         General transfer comment: +2 Max assist progressing to total assist as pt fatigued. Several attempts to rise from lower BSC. Significant elevated surface from EOB.  Ambulation/Gait             General Gait Details: Unable   Marine scientist Rankin (Stroke Patients Only)       Balance Overall balance assessment: Needs assistance Sitting-balance support: Bilateral upper extremity supported;Feet supported Sitting balance-Leahy Scale: Poor Sitting balance - Comments: External assist required in addition to UE support to maintain static sitting   Standing balance support: Bilateral upper extremity supported Standing balance-Leahy Scale: Poor Standing balance comment: reliant on UE support and external support                            Cognition Arousal/Alertness: Awake/alert Behavior During Therapy: Anxious Overall Cognitive Status: No family/caregiver present to determine baseline cognitive functioning Area of Impairment: Memory;Following commands;Safety/judgement;Awareness;Problem solving                     Memory: Decreased recall of precautions;Decreased short-term memory Following Commands: Follows one step commands with increased time              Exercises      General Comments        Pertinent Vitals/Pain Pain Assessment: Faces Faces Pain Scale: Hurts a little bit Pain Location: low back Pain Descriptors / Indicators:  Grimacing Pain Intervention(s): Limited activity within patient's tolerance;Monitored during session;Repositioned    Home Living                      Prior Function            PT Goals (current goals can now be found in the care plan section) Acute Rehab PT Goals Patient Stated Goal: finish rehab, be able to walk again, take care of myself and do everything I  was doinig before PT Goal Formulation: With patient Time For Goal Achievement: 01/04/21 Potential to Achieve Goals: Fair Progress towards PT goals: Progressing toward goals    Frequency    Min 3X/week      PT Plan Current plan remains appropriate;Frequency needs to be updated    Co-evaluation              AM-PAC PT "6 Clicks" Mobility   Outcome Measure  Help needed turning from your back to your side while in a flat bed without using bedrails?: A Lot Help needed moving from lying on your back to sitting on the side of a flat bed without using bedrails?: A Lot Help needed moving to and from a bed to a chair (including a wheelchair)?: Total Help needed standing up from a chair using your arms (e.g., wheelchair or bedside chair)?: Total Help needed to walk in hospital room?: Total Help needed climbing 3-5 steps with a railing? : Total 6 Click Score: 8    End of Session Equipment Utilized During Treatment: Gait belt Activity Tolerance: Patient limited by fatigue (weakness) Patient left: in bed;with call bell/phone within reach Nurse Communication: Mobility status PT Visit Diagnosis: Muscle weakness (generalized) (M62.81);Unsteadiness on feet (R26.81);Other abnormalities of gait and mobility (R26.89);Pain Pain - part of body:  (Back)     Time: 6759-1638 PT Time Calculation (min) (ACUTE ONLY): 24 min  Charges:  $Gait Training: 23-37 mins                     Rolinda Roan, PT, DPT Acute Rehabilitation Services Pager: [phone] Office: [phone]    Thelma Comp 12/22/2020, 11:42 AM

## 2020-12-22 NOTE — TOC Initial Note (Signed)
Transition of Care [facility]) - Initial/Assessment Note    Patient Details  Name: Summer Hawkins MRN: [id number] Date of Birth: [birth year]  Transition of Care [facility]) CM/SW Contact:    [name], LCSW Phone Number: 12/22/2020, 10:02 AM  Clinical Narrative: CSW spoke with pt regarding return to [facility] at [place].  Pt reports she lives alone, has [facility] aide in the home daily, which will resume when she returns home.  Her granddaughter [name] is her POA, permission given to speak with granddaughter, son Nicki Reaper, and two sisters: [name] and [name].  Pt is planning to return to [facility] upon DC.  She has been vaccinated for covid with 2 boosters.  CSW confirmed with Lexine Baton at [facility] that pt can return at DC.  Pt may need wound vac, Nikki aware.               Expected Discharge Plan: Skilled Nursing Facility Barriers to Discharge: Continued Medical Work up   Patient Goals and CMS Choice   CMS Medicare.gov Compare Post Acute Care list provided to::  (NA-pt wants to return to [facility])    Expected Discharge Plan and Services Expected Discharge Plan: [place] Choice: [place] Living arrangements for the past 2 months: [place]                                      Prior Living Arrangements/Services Living arrangements for the past 2 months: [place] Lives with:: Facility Resident Patient language and need for interpreter reviewed:: Yes Do you feel safe going back to the place where you live?: Yes      Need for Family Participation in Patient Care: No (Comment) Care giver support system in place?: Yes (comment) Current home services: Other (comment) (when at home, pt does have [employer] aide in place, private duty) Criminal Activity/Legal Involvement Pertinent to Current Situation/Hospitalization: No - Comment as needed  Activities of Daily Living Home Assistive Devices/Equipment: Environmental consultant  (specify type), CBG Meter, Eyeglasses, Wheelchair, Dentures (specify type) ADL Screening (condition at time of admission) Patient's cognitive ability adequate to safely complete daily activities?: No Is the patient deaf or have difficulty hearing?: No Does the patient have difficulty seeing, even when wearing glasses/contacts?: No Does the patient have difficulty concentrating, remembering, or making decisions?: Yes Patient able to express need for assistance with ADLs?: Yes Does the patient have difficulty dressing or bathing?: Yes Independently performs ADLs?: No Communication: Independent Dressing (OT): Needs assistance Is this a change from baseline?: Pre-admission baseline Grooming: Needs assistance Is this a change from baseline?: Pre-admission baseline Feeding: Independent Bathing: Needs assistance Is this a change from baseline?: Pre-admission baseline Toileting: Needs assistance Is this a change from baseline?: Pre-admission baseline In/Out Bed: Needs assistance Is this a change from baseline?: Pre-admission baseline Walks in Home: Needs assistance Is this a change from baseline?: Pre-admission baseline Does the patient have difficulty walking or climbing stairs?: Yes Weakness of Legs: Both Weakness of Arms/Hands: None  Permission Sought/Granted Permission sought to share information with : Family Supports    Share Information with NAME: granddaughter [name] [facility]) and son Nicki Reaper  Permission granted to share info w AGENCY: Health and safety inspector        Emotional Assessment Appearance:: Appears stated age Attitude/Demeanor/Rapport: Engaged Affect (typically observed): Appropriate, Pleasant Orientation: : Oriented to Self, Oriented to Place, Oriented  to  Time, Oriented to Situation Alcohol / Substance Use: Not Applicable Psych Involvement: No (comment)  Admission diagnosis:  Postoperative complication of skin involving drainage from surgical wound [L76.82] Patient Active  Problem List   Diagnosis Date Noted   Postoperative complication of skin involving drainage from surgical wound 12/20/2020   Sepsis ([place]) 11/26/2020   Back pain with history of spinal surgery 11/26/2020   Gram-negative bacteremia 11/26/2020   Sepsis due to urinary tract infection ([place]) 29/56/2130   Acute metabolic encephalopathy 86/57/8469   Type 2 diabetes mellitus without complication, with long-term current use of insulin ([place]) 11/25/2020   Hypothyroidism 11/25/2020   CKD (chronic kidney disease), stage III ([place]) 11/25/2020   Spondylolisthesis, lumbar region 11/17/2020   Posterior capsular opacification, left 03/31/2020   Posterior vitreous detachment of both eyes 03/31/2020   Intermediate stage nonexudative age-related macular degeneration of both eyes 09/25/2019   Moderate nonproliferative diabetic retinopathy of both eyes ([place]) 09/25/2019   Pacemaker 03/20/2019   Symptomatic bradycardia 11/30/2018   Syncope and collapse 11/21/2018   Bradycardia 09/25/2017   Permanent atrial fibrillation ([place])    Chronic diastolic CHF (congestive heart failure) ([place]) 08/08/2017   Essential hypertension 08/08/2017   Breast cancer of upper-outer quadrant of left female breast ([place]) 01/05/2016   Chronic atrial fibrillation ([place]) 01/05/2011   PCP:  Haywood Pao, MD Pharmacy:   [facility] [place], [place] - [address] [place] [address] Phone: [phone] Fax: [phone]  [facility]  ([place], [place] [facility] [address] [place] [address] Phone: [phone] Fax: [place], [place] [address] Phone: [phone] Fax: [phone]     Social Determinants of Health (SDOH) Interventions    Readmission Risk  Interventions No flowsheet data found.

## 2020-12-23 ENCOUNTER — Inpatient Hospital Stay (HOSPITAL_COMMUNITY): Payer: Medicare Other

## 2020-12-23 ENCOUNTER — Inpatient Hospital Stay: Payer: Self-pay

## 2020-12-23 DIAGNOSIS — T8189XA Other complications of procedures, not elsewhere classified, initial encounter: Secondary | ICD-10-CM

## 2020-12-23 LAB — GLUCOSE, CAPILLARY
Glucose-Capillary: 185 mg/dL — ABNORMAL HIGH (ref 70–99)
Glucose-Capillary: 210 mg/dL — ABNORMAL HIGH (ref 70–99)
Glucose-Capillary: 232 mg/dL — ABNORMAL HIGH (ref 70–99)
Glucose-Capillary: 178 mg/dL — ABNORMAL HIGH (ref 70–99)

## 2020-12-23 NOTE — Progress Notes (Signed)
Telephone consent obtained from granddaughter for PICC.

## 2020-12-23 NOTE — TOC Progression Note (Signed)
Transition of Care [facility]) - Progression Note    Patient Details  Name: Summer Hawkins MRN: [id number] Date of Birth: 1934-06-17  Transition of Care [facility]) CM/SW Contact  [name], LCSW Phone Number: 12/23/2020, 10:10 AM  Clinical Narrative:   CSW updated Nikki at [facility] of no DC today, need for IV abx.  [place] can't hold bed past today so return will be based on open beds.  Lexine Baton also said will be not possible to get IV abx over the weekend so if weekend discharge is anticipated, would need to get that in place Friday. Multimedia programmer)    Expected Discharge Plan: [place] Barriers to Discharge: Continued Medical Work up  Expected Discharge Plan and Services Expected Discharge Plan: [place] Choice: [place] arrangements for the past 2 months: [place]                                       Social Determinants of Health (SDOH) Interventions    Readmission Risk Interventions No flowsheet data found.

## 2020-12-23 NOTE — Progress Notes (Signed)
Peripherally Inserted Central Catheter Placement  The IV Nurse has discussed with the patient and/or persons authorized to consent for the patient, the purpose of this procedure and the potential benefits and risks involved with this procedure.  The benefits include less needle sticks, lab draws from the catheter, and the patient may be discharged home with the catheter. Risks include, but not limited to, infection, bleeding, blood clot (thrombus formation), and puncture of an artery; nerve damage and irregular heartbeat and possibility to perform a PICC exchange if needed/ordered by physician.  Alternatives to this procedure were also discussed.  Bard Power PICC patient education guide, fact sheet on infection prevention and patient information card has been provided to patient /or left at bedside.    PICC Placement Documentation  PICC Single Lumen 60/60/04 Right Basilic 39 cm 0 cm (Active)       Summer Hawkins 12/23/2020, 11:36 PM

## 2020-12-23 NOTE — Progress Notes (Signed)
[facility] Infectious Diseases Follow Up Note  Patient Identification: Patient Name: Summer Hawkins MRN: [id number] [place] Date: 12/20/2020  9:02 AM Age: 86 y.o.Today's Date: 12/23/2020   Reason for Visit: Lumbar wound dehiscence   Active Problems:   Postoperative complication of skin involving drainage from surgical wound   Antibiotics: Vancomycin 10/10-c                    Ceftriaxone 10/10-c   Lines/hardwares: Lumbar hardware    Interval Events: continues to be afebrile, no leukocytosis. OR cultures 10/10 Proteus mirabilis    Assessment # Wound dehiscence in the setting of recent instrumented TLIF -Per review of OR and notes from surgery, " there is moderately large wound dehiscence superficially to the subcutaneous tissue.  We obtain cultures.  I did not see any obvious infection but I assume it was infected.  The infection/dehiscence did not appear to go below the paraspinous musculature."   #Recent h/o Proteus mirabilis bacteremia s/p appropriate tx  # Recent Klebsiella UTI # DM # CKD   Recommendations DC Vancomycin, continue ceftriaxone Fu sensitivities for Proteus mirabilis for final abtx recs PICC line already ordered  Discussed with Dr Arnoldo Morale, agree with 6 weeks of IV abtx given positive OR cultures/recent bacteremia and delayed wound healing although the infection did not appear to be deeper beneath paraspinal musculature Monitor CBC and CMP Discussed with patient/ID pharmacy   Rest of the management as per the primary team. Thank you for the consult. Please page with pertinent questions or concerns.  ______________________________________________________________________ Subjective patient seen and examined at the bedside. She is still unable to walk and is working with PT. Eager to go home soon.  Vitals BP (!) 166/80 (BP Location: Right Arm)   Pulse 69   Temp 98.2 F (36.8 C) (Oral)   Resp 18   Ht  5\' 5"  (1.651 m)   Wt 90.7 kg   LMP  (LMP Unknown)   SpO2 98%   BMI 33.28 kg/m     Physical Exam Constitutional:  sitting up in bed, tray at bedside, appears comfortable     Comments:   Cardiovascular:     Rate and Rhythm: Normal rate and regular rhythm.     Heart sounds:   Pulmonary:     Effort: Pulmonary effort is normal.     Comments:   Abdominal:     Palpations: Abdomen is soft.     Tenderness:   Musculoskeletal:        General: No swelling or tenderness.   Neurological:     General: Lower extremity weakness  Psychiatric:        Mood and Affect: Mood normal.    Pertinent Microbiology Results for orders placed or performed during the hospital encounter of 12/20/20  SARS Coronavirus 2 by RT PCR (hospital order, performed in [facility] hospital lab) Nasopharyngeal Nasopharyngeal Swab     Status: None   Collection Time: 12/20/20  9:05 AM   Specimen: Nasopharyngeal Swab  Result Value Ref Range Status   SARS Coronavirus 2 NEGATIVE NEGATIVE Final    Comment: (NOTE) SARS-CoV-2 target nucleic acids are NOT DETECTED.  The SARS-CoV-2 RNA is generally detectable in upper and lower respiratory specimens during the acute phase of infection. The lowest concentration of SARS-CoV-2 viral copies this assay can detect is 250 copies / mL. A negative result does not preclude SARS-CoV-2 infection and should not be used as the sole basis for treatment or other patient management decisions.  A negative result may occur with improper specimen collection / handling, submission of specimen other than nasopharyngeal swab, presence of viral mutation(s) within the areas targeted by this assay, and inadequate number of viral copies (<250 copies / mL). A negative result must be combined with clinical observations, patient history, and epidemiological information.  Fact Sheet for Patients:   StrictlyIdeas.no  Fact Sheet for Healthcare  Providers: BankingDealers.co.za  This test is not yet approved or  cleared by the [place] FDA and has been authorized for detection and/or diagnosis of SARS-CoV-2 by FDA under an Emergency Use Authorization (EUA).  This EUA will remain in effect (meaning this test can be used) for the duration of the COVID-19 declaration under Section 564(b)(1) of the Act, 21 U.S.C. section 360bbb-3(b)(1), unless the authorization is terminated or revoked sooner.  Performed at [facility], [place] [address], [place], [place] [address]   Surgical pcr screen     Status: None   Collection Time: 12/20/20  9:12 AM   Specimen: Nasal Mucosa; Nasal Swab  Result Value Ref Range Status   MRSA, PCR NEGATIVE NEGATIVE Final   Staphylococcus aureus NEGATIVE NEGATIVE Final    Comment: (NOTE) The Xpert SA Assay (FDA approved for NASAL specimens in patients 9 years of age and older), is one component of a comprehensive surveillance program. It is not intended to diagnose infection nor to guide or monitor treatment. Performed at [facility], [place] [address], [place], [place] [address]   Aerobic/Anaerobic Culture w Gram Stain (surgical/deep wound)     Status: None (Preliminary result)   Collection Time: 12/20/20 12:49 PM   Specimen: Soft Tissue, Other  Result Value Ref Range Status   Specimen Description WOUND  Final   Special Requests LUMBAR SUBCUTANEIOUS  Final   Gram Stain   Final    RARE WBC PRESENT, PREDOMINANTLY MONONUCLEAR NO ORGANISMS SEEN Performed at [facility], [place] [address], [place], [place] [address]    Culture   Final    RARE PROTEUS MIRABILIS SUSCEPTIBILITIES TO FOLLOW NO ANAEROBES ISOLATED; CULTURE IN PROGRESS FOR 5 DAYS    Report Status PENDING  Incomplete  Culture, blood (routine x 2)     Status: None (Preliminary result)   Collection Time: 12/21/20  8:48 AM   Specimen: BLOOD LEFT ARM  Result Value Ref Range Status   Specimen Description  BLOOD LEFT ARM  Final   Special Requests IN PEDIATRIC BOTTLE Blood Culture adequate volume  Final   Culture   Final    NO GROWTH 2 DAYS Performed at [facility], [place] [address], [place], [place] [address]    Report Status PENDING  Incomplete  Culture, blood (routine x 2)     Status: None (Preliminary result)   Collection Time: 12/21/20  8:59 AM   Specimen: BLOOD RIGHT HAND  Result Value Ref Range Status   Specimen Description BLOOD RIGHT HAND  Final   Special Requests IN PEDIATRIC BOTTLE Blood Culture adequate volume  Final   Culture   Final    NO GROWTH 2 DAYS Performed at [facility], [place] [address] [place], [place] [address]    Report Status PENDING  Incomplete     Pertinent Lab. CBC Latest Ref Rng & Units 12/21/2020 12/20/2020 11/30/2020  WBC 4.0 - 10.5 K/uL 10.0 - 14.9(H)  Hemoglobin 12.0 - 15.0 g/dL 10.7(L) 13.3 10.4(L)  Hematocrit 36.0 - 46.0 % 35.1(L) 39.0 33.0(L)  Platelets 150 - 400 K/uL  373 - 414(H)   CMP Latest Ref Rng & Units 12/21/2020 12/20/2020 11/30/2020  Glucose 70 - 99 mg/dL 176(H) 174(H) 163(H)  BUN 8 - 23 mg/dL 26(H) 30(H) 26(H)  Creatinine 0.44 - 1.00 mg/dL 1.57(H) 1.60(H) 1.36(H)  Sodium 135 - 145 mmol/L 141 145 131(L)  Potassium 3.5 - 5.1 mmol/L 3.5 4.3 4.5  Chloride 98 - 111 mmol/L 110 111 96(L)  CO2 22 - 32 mmol/L 20(L) - 21(L)  Calcium 8.9 - 10.3 mg/dL 9.3 - 8.8(L)  Total Protein 6.5 - 8.1 g/dL - - -  Total Bilirubin 0.3 - 1.2 mg/dL - - -  Alkaline Phos 38 - 126 U/L - - -  AST 15 - 41 U/L - - -  ALT 0 - 44 U/L - - -     Pertinent Imaging today Plain films and CT images have been personally visualized and interpreted; radiology reports have been reviewed. Decision making incorporated into the Impression / Recommendations.  I spent more than 35 minutes for this patient encounter including review of prior medical records, coordination of care  with greater than 50% of time being face to face/counseling and discussing  diagnostics/treatment plan with the patient/family.  Electronically signed by:   Rosiland Oz, MD Infectious Disease Physician [facility]ious Disease Pager: [phone]

## 2020-12-23 NOTE — Consult Note (Signed)
WOC consulted to assist with management of NPWT dressing to the surgical site (lumbar).  Noted VAC placed on 12/20/20 in OR. No orders for WOC until today. Will initiate NPWT dressing changes beginning 12/24/20 per discussion with neurosurgeon.  Supplies ordered for dressing change; will coordinate pain medication needs in the am with staff.   [place], [place], [place]

## 2020-12-23 NOTE — Progress Notes (Addendum)
Subjective: The patient is alert and pleasant.  She has no complaints.  Objective: Vital signs in last 24 hours: Temp:  [97.7 F (36.5 C)-98.9 F (37.2 C)] 98.2 F (36.8 C) (10/13 0708) Pulse Rate:  [69-73] 69 (10/13 0708) Resp:  [16-18] 18 (10/13 0708) BP: (136-166)/(67-80) 166/80 (10/13 0708) SpO2:  [98 %-100 %] 98 % (10/13 0708) Estimated body mass index is 33.28 kg/m as calculated from the following:   Height as of this encounter: 5\' 5"  (1.651 m).   Weight as of this encounter: 90.7 kg.   Intake/Output from previous day: 10/12 0701 - 10/13 0700 In: 240 [P.O.:240] Out: -  Intake/Output this shift: No intake/output data recorded.  Physical exam the patient is alert and pleasant.  She is moving her lower extremities well.  Her blood cultures are negative.  Her wound cultures have grown Proteus.  Lab Results: Recent Labs    12/20/20 0930 12/21/20 0551  WBC  --  10.0  HGB 13.3 10.7*  HCT 39.0 35.1*  PLT  --  373   BMET Recent Labs    12/20/20 0930 12/21/20 0551  NA 145 141  K 4.3 3.5  CL 111 110  CO2  --  20*  GLUCOSE 174* 176*  BUN 30* 26*  CREATININE 1.60* 1.57*  CALCIUM  --  9.3    Studies/Results: [place] EKG SITE RITE  Result Date: 12/23/2020 If Site Rite image not attached, placement could not be confirmed due to current cardiac rhythm.   Assessment/Plan: Wound infection/dehiscence: The patient's wound cultures are growing Proteus which was consistent with her previous UTI.  Her blood cultures are negative so I will order a PICC line in anticipation of prolonged IV antibiotics.  I spoke with infectious disease yesterday.  I have answered all the patient's questions.  I will ask wound care to see the patient to assist this with her wound VAC.  LOS: 3 days     [name] 12/23/2020, 9:06 AM     Patient ID: Summer Hawkins, female   DOB: [birth year], 86 y.o.   MRN: [id number]

## 2020-12-24 LAB — GLUCOSE, CAPILLARY
Glucose-Capillary: 166 mg/dL — ABNORMAL HIGH (ref 70–99)
Glucose-Capillary: 146 mg/dL — ABNORMAL HIGH (ref 70–99)
Glucose-Capillary: 275 mg/dL — ABNORMAL HIGH (ref 70–99)

## 2020-12-24 MED ORDER — CEFAZOLIN SODIUM-DEXTROSE 2-4 GM/100ML-% IV SOLN
2.0000 g | Freq: Two times a day (BID) | INTRAVENOUS | Status: DC
  Administered 2020-12-24 – 2020-12-27 (×7): 2 g via INTRAVENOUS
  Filled 2020-12-24 (×9): qty 100

## 2020-12-24 MED ORDER — CHLORHEXIDINE GLUCONATE CLOTH 2 % EX PADS
6.0000 | MEDICATED_PAD | Freq: Every day | CUTANEOUS | Status: DC
  Administered 2020-12-24 – 2020-12-26 (×3): 6 via TOPICAL

## 2020-12-24 MED ORDER — CEFAZOLIN IV (FOR PTA / DISCHARGE USE ONLY): 2.0000 g | Freq: Two times a day (BID) | INTRAVENOUS | 0 refills | Status: AC

## 2020-12-24 MED ORDER — SODIUM CHLORIDE 0.9% FLUSH: 10.0000 mL | INTRAVENOUS | Status: DC | PRN

## 2020-12-24 MED ORDER — SODIUM CHLORIDE 0.9% FLUSH
10.0000 mL | Freq: Two times a day (BID) | INTRAVENOUS | Status: DC
  Administered 2020-12-24 – 2020-12-27 (×6): 10 mL

## 2020-12-24 NOTE — Progress Notes (Signed)
Occupational Therapy Treatment Patient Details Name: Summer Hawkins MRN: [id number] DOB: 1935-01-12 Today's Date: 12/24/2020   History of present illness Pt is an 86 y/o female who presents with persistent wound drainge since lumbar fusion on 11/17/20. Pt underwent I&D and palcement of wound vac on 10/10. Pt also with recent admission due to UTI. PMH: breast cancer, CKD, DM, HTN   OT comments  Patient received in bed and agreeable to OT treatment. Patient required verbal cues and mod assist to get to eob and was able to maintain balance with RUE using rail. Patient was squat pivot transfer into wheelchair and performed seated grooming tasks. PT joined treatment to address standing from wheelchair to RW with mod assist +2 for 3 stands and extra time in between stands to recover.  Patient to continue to be followed by acute OT.    Recommendations for follow up therapy are one component of a multi-disciplinary discharge planning process, led by the attending physician.  Recommendations may be updated based on patient status, additional functional criteria and insurance authorization.    Follow Up Recommendations  SNF;Supervision/Assistance - 24 hour    Equipment Recommendations  None recommended by OT    Recommendations for Other Services      Precautions / Restrictions Precautions Precautions: Fall;Back Precaution Booklet Issued: Yes (comment) Precaution Comments: no brace needed per orders, lumbar wound vac Restrictions Weight Bearing Restrictions: No       Mobility Bed Mobility Overal bed mobility: Needs Assistance Bed Mobility: Rolling;Sidelying to Sit Rolling: Min assist Sidelying to sit: Mod assist       General bed mobility comments: verbal cues for log rolling and rail use    Transfers Overall transfer level: Needs assistance Equipment used: Rolling walker (2 wheeled) Transfers: Sit to/from [employer] Sit to Stand: Mod assist;+2 physical assistance    Squat pivot transfers: Mod assist     General transfer comment: mod assist for squat pivot transfer into wheelchair. Mod assist x2 for sit to stands from wheelchair to RW    Balance Overall balance assessment: Needs assistance Sitting-balance support: Feet supported;Single extremity supported Sitting balance-Leahy Scale: Poor Sitting balance - Comments: one extremity assist with sitting balance   Standing balance support: Bilateral upper extremity supported Standing balance-Leahy Scale: Poor Standing balance comment: reliant on UE support and external support                           ADL either performed or assessed with clinical judgement   ADL Overall ADL's : Needs assistance/impaired     Grooming: Wash/dry hands;Wash/dry face;Brushing hair;Sitting;Set up Grooming Details (indicate cue type and reason): performed seated in wheelchair                               General ADL Comments: decreased axiety and willing to participate in grooming tasks     Vision       Perception     Praxis      Cognition Arousal/Alertness: Awake/alert Behavior During Therapy: Anxious Overall Cognitive Status: No family/caregiver present to determine baseline cognitive functioning Area of Impairment: Memory;Following commands;Safety/judgement;Awareness;Problem solving                 Orientation Level: Disoriented to;Time   Memory: Decreased recall of precautions;Decreased short-term memory Following Commands: Follows one step commands with increased time Safety/Judgement: Decreased awareness of safety;Decreased awareness of deficits Awareness: Intellectual;Emergent Problem  Solving: Slow processing;Decreased initiation;Difficulty sequencing;Requires verbal cues;Requires tactile cues General Comments: talkative this session        Exercises     Shoulder Instructions       General Comments      Pertinent Vitals/ Pain       Pain Assessment:  0-10 Pain Score: 6  Faces Pain Scale: Hurts little more Pain Location: low back Pain Descriptors / Indicators: Grimacing Pain Intervention(s): Monitored during session  Home Living                                          Prior Functioning/Environment              Frequency  Min 2X/week        Progress Toward Goals  OT Goals(current goals can now be found in the care plan section)  Progress towards OT goals: Progressing toward goals  Acute Rehab OT Goals Patient Stated Goal: finish rehab, be able to walk again, take care of myself and do everything I was doinig before OT Goal Formulation: With patient Time For Goal Achievement: 01/04/21 Potential to Achieve Goals: Fair ADL Goals Pt Will Perform Grooming: with modified independence;sitting Pt Will Perform Lower Body Bathing: with mod assist;sit to/from stand;with adaptive equipment Pt Will Perform Upper Body Dressing: sitting;with supervision Pt Will Perform Lower Body Dressing: with mod assist;sit to/from stand;with adaptive equipment Pt Will Transfer to Toilet: with mod assist;bedside commode;stand pivot transfer Pt Will Perform Toileting - Clothing Manipulation and hygiene: with mod assist;with adaptive equipment;sitting/lateral leans;sit to/from stand  Plan Discharge plan remains appropriate;Frequency remains appropriate    Co-evaluation    PT/OT/SLP Co-Evaluation/Treatment: Yes Reason for Co-Treatment: For patient/therapist safety;To address functional/ADL transfers   OT goals addressed during session: ADL's and self-care      AM-PAC OT "6 Clicks" Daily Activity     Outcome Measure   Help from another person eating meals?: A Little Help from another person taking care of personal grooming?: A Little Help from another person toileting, which includes using toliet, bedpan, or urinal?: Total Help from another person bathing (including washing, rinsing, drying)?: A Lot Help from another  person to put on and taking off regular upper body clothing?: A Little Help from another person to put on and taking off regular lower body clothing?: Total 6 Click Score: 13    End of Session Equipment Utilized During Treatment: Gait belt;Rolling walker  OT Visit Diagnosis: Unsteadiness on feet (R26.81);Other abnormalities of gait and mobility (R26.89);Muscle weakness (generalized) (M62.81);Pain Pain - part of body:  (back)   Activity Tolerance Patient tolerated treatment well   Patient Left in chair;Other (comment) (PT completed their treatment with patient)   Nurse Communication Mobility status        Time: 7048-8891 OT Time Calculation (min): 32 min  Charges: OT General Charges $OT Visit: 1 Visit OT Treatments $Self Care/Home Management : 8-22 mins $Therapeutic Activity: 8-22 mins  Lodema Hong, [place]  Pager [phone] Office [place] 12/24/2020, 2:05 PM

## 2020-12-24 NOTE — Progress Notes (Signed)
PHARMACY CONSULT NOTE FOR:  OUTPATIENT  PARENTERAL ANTIBIOTIC THERAPY (OPAT)  Indication: Wound Infection Regimen: Cefazolin 2g IV q12h End date: 01/31/21  IV antibiotic discharge orders are pended. To discharging provider:  please sign these orders via discharge navigator,  Select New Orders & click on the button choice - Manage This Unsigned Work.     Thank you for allowing pharmacy to be a part of this patient's care.  Lestine Box, PharmD PGY2 Infectious Diseases Pharmacy Resident   Please check [email or web address] for unit-specific pharmacy phone numbers

## 2020-12-24 NOTE — Progress Notes (Signed)
.  Subjective: The patient is alert and pleasant.  She is in no apparent distress.  She looks well.  Objective: Vital signs in last 24 hours: Temp:  [97.8 F (36.6 C)-98.6 F (37 C)] 97.8 F (36.6 C) (10/14 0730) Pulse Rate:  [69-75] 75 (10/14 0730) Resp:  [18] 18 (10/14 0730) BP: (144-175)/(71-86) 146/84 (10/14 0730) SpO2:  [98 %-100 %] 100 % (10/14 0730) Estimated body mass index is 33.28 kg/m as calculated from the following:   Height as of this encounter: 5\' 5"  (1.651 m).   Weight as of this encounter: 90.7 kg.   Intake/Output from previous day: No intake/output data recorded. Intake/Output this shift: No intake/output data recorded.  Physical exam the patient is alert and pleasant.  She is moving her lower extremities well.  Her wound VAC was changed by the wound care team.  Her cultures have grown sensitive Proteus  Lab Results: No results for input(s): WBC, HGB, HCT, PLT in the last 72 hours. BMET No results for input(s): NA, K, CL, CO2, GLUCOSE, BUN, CREATININE, CALCIUM in the last 72 hours.  Studies/Results: DG CHEST PORT 1 VIEW  Result Date: 12/23/2020 CLINICAL DATA:  Status post PICC line placement. EXAM: PORTABLE CHEST 1 VIEW patient is rotated. COMPARISON:  Chest x-ray 11/25/2020 FINDINGS: Interval placement of a right PICC line with tip overlying the expected region of the distal superior vena cava in the setting of patient rotation. Two lead cardiac pacemaker in similar position. The heart and mediastinal contours are unchanged. Aortic calcification. No focal consolidation. No pulmonary edema. No pleural effusion. No pneumothorax. No acute osseous abnormality. IMPRESSION: Interval placement of a right PICC line with tip overlying the expected region of the distal superior vena cava in the setting of patient rotation. Consider repeating with patient face in [place]. Electronically Signed   By: Iven Finn M.D.   On: 12/23/2020 23:48   [place] EKG SITE RITE  Result  Date: 12/23/2020 If Site Rite image not attached, placement could not be confirmed due to current cardiac rhythm.   Assessment/Plan: Proteus wound infection: The patient is progressing well.  The plan is to send her home with IV cefazolin.  We will discharge her to nursing home when a bed is available.  LOS: 4 days     [name] 12/24/2020, 10:23 AM

## 2020-12-24 NOTE — Consult Note (Signed)
[place] Nurse Consult Note: Reason for Consult: NPWT dressing change S/P I&D lumbar fusion site Wound type: surgical  Pressure Injury POA: NA Measurement:8cm x 5cm x 1.0cm with tunneling at 7 o'clock that is 2.5cm  Wound bed:100% clean, pink, moist Drainage (amount, consistency, odor) minimal in VAC canister Periwound: intact  Dressing procedure/placement/frequency: Removed old NPWT dressing Filled wound with  _1__ piece of black foam Sealed NPWT dressing at 153mm HG Patient received IV and PO pain medication per bedside nurse prior to dressing change Patient tolerated procedure well [place] nurse will continue to provide NPWT dressing changed due to the complexity of the dressing change.   Patient [facility] DC back [facility]   [place] nursing team will follow along for NPWT dressing changes while inpatient [place], [place], [place]

## 2020-12-24 NOTE — Progress Notes (Addendum)
ID Brief Note   OPAT orders below ID will sign off. Please call with questions.    Diagnosis: wound dehiscence with delayed wound healing   Culture Result: Proteus mirabilis   Allergies  Allergen Reactions   Bee Venom Shortness Of Breath, Nausea And Vomiting and Other (See Comments)    Makes the patient feel faint, also   Penicillins Anaphylaxis, Hives, Swelling and Other (See Comments)    Has patient had a PCN reaction causing immediate rash, facial/tongue/throat swelling, SOB or lightheadedness with hypotension: Yes Has patient had a PCN reaction causing severe rash involving mucus membranes or skin necrosis: No Has patient had a PCN reaction that required hospitalization: No Has patient had a PCN reaction occurring within the last 10 years: No If all of the above answers are "NO", then may proceed with Cephalosporin use.    Lisinopril Cough   Zocor [Simvastatin] Other (See Comments)    memory changes   Liraglutide Rash and Other (See Comments)    Rash at injection site    OPAT Orders Discharge antibiotics to be given via PICC line Discharge antibiotics: cefazolin  Per pharmacy protocol  Aim for Vancomycin trough 15-20 or AUC 400-550 (unless otherwise indicated) Duration: 6 weeks End Date: 01/31/21  [facility] Care Per Protocol:  Home health RN for IV administration and teaching; PICC line care and labs.    Labs weekly while on IV antibiotics: X__ CBC with differential X__ BMP __ CMP X__ CRP X__ ESR __ Vancomycin trough __ CK  X_ Please pull PIC at completion of IV antibiotics __ Please leave PIC in place until doctor has seen patient or been notified  Fax weekly labs to [phone]  Clinic Follow Up Appt: Rosiland Oz  01/11/21@ 8:45 am   Rosiland Oz, MD Infectious Disease Physician [facility] [address] [place], [place] [address] Phone: [phone]  Fax: [phone]

## 2020-12-24 NOTE — Progress Notes (Signed)
Pt with order to transfer to 6N. Report given to [facility]. V/S stable. Transferred pt without complications.

## 2020-12-24 NOTE — Progress Notes (Signed)
Physical Therapy Treatment Patient Details Name: Summer Hawkins MRN: [id number] DOB: 1935/01/19 Today's Date: 12/24/2020   History of Present Illness Pt is an 86 y/o female who presents with persistent wound drainge since lumbar fusion on 11/17/20. Pt underwent I&D and palcement of wound vac on 10/10. Pt also with recent admission due to UTI. PMH: breast cancer, CKD, DM, HTN    PT Comments    Pt remains limited by reports of back pain and weakness. Pt is able to stand and initiate ambulation with 2 person assistance at this time, following commands for anterior trunk lean well. With only one person assistance pt demonstrates significant difficulty leaning forward and facilitating LE musculature to clear buttocks from wheelchair. Later with +2 assistance pt is able to clear buttocks and initiates squat from wheelchair well. Pt will benefit from continued aggressive mobilization to improve strength and reduce caregiver burden and falls risk. PT recommends SNF placement.   Recommendations for follow up therapy are one component of a multi-disciplinary discharge planning process, led by the attending physician.  Recommendations may be updated based on patient status, additional functional criteria and insurance authorization.  Follow Up Recommendations  SNF;Supervision/Assistance - 24 hour     Equipment Recommendations  Other (comment) (defer to post-acute)    Recommendations for Other Services       Precautions / Restrictions Precautions Precautions: Fall;Back Precaution Booklet Issued: Yes (comment) Precaution Comments: no brace needed per orders, lumbar wound vac Restrictions Weight Bearing Restrictions: No     Mobility  Bed Mobility Overal bed mobility: Needs Assistance Bed Mobility: Rolling;Sit to Supine Rolling: Min assist Sidelying to sit: Mod assist   Sit to supine: Total assist;+2 for physical assistance   General bed mobility comments: totalA x2 provided as pt reporting  back pain, assistance used to quicken bed mobility to obtain a comfortable position for the patient    Transfers Overall transfer level: Needs assistance Equipment used: Rolling walker (2 wheeled);2 person hand held assist Transfers: Sit to/from [employer] Sit to Stand: Mod assist;+2 physical assistance   Squat pivot transfers: Max assist;+2 physical assistance     General transfer comment: pt completes 3 sit to stands with modA x2 with walker from wheelchair. Pt later attempts to stand with one person assistance and knee block but is unable to clear buttocks. Pt laterl performs squat pivot back to bed with 2 person maxA and knee block  Ambulation/Gait Ambulation/Gait assistance: Max assist;+2 physical assistance Gait Distance (Feet): 1 Feet (pt takes one step forward with each foot) Assistive device: Rolling walker (2 wheeled) Gait Pattern/deviations: Step-to pattern Gait velocity: reduced Gait velocity interpretation: <1.31 ft/sec, indicative of household ambulator General Gait Details: short steps, reduced foot clearance, posterior lean   Theme park manager mobility: Yes Wheelchair propulsion: Both upper extremities Wheelchair parts: Needs assistance Distance: 5 (pt reports back pain with propulsion of wheelchair, PT then wheels pt in chair to view outside through windows)  Modified Rankin (Stroke Patients Only)       Balance Overall balance assessment: Needs assistance Sitting-balance support: Single extremity supported;Bilateral upper extremity supported Sitting balance-Leahy Scale: Poor Sitting balance - Comments: reliant on UE support   Standing balance support: Bilateral upper extremity supported Standing balance-Leahy Scale: Zero Standing balance comment: modA-maxA x 2  Cognition Arousal/Alertness: Awake/alert Behavior During Therapy: WFL for tasks  assessed/performed Overall Cognitive Status: Impaired/Different from baseline Area of Impairment: Memory;Safety/judgement;Awareness;Orientation                 Orientation Level: Time   Memory: Decreased short-term memory Following Commands: Follows one step commands with increased time Safety/Judgement: Decreased awareness of deficits;Decreased awareness of safety Awareness: Emergent Problem Solving: Slow processing;Decreased initiation;Difficulty sequencing;Requires verbal cues;Requires tactile cues General Comments: talkative this session      Exercises      General Comments General comments (skin integrity, edema, etc.): VSS on RA      Pertinent Vitals/Pain Pain Assessment: Faces Pain Score: 6  Faces Pain Scale: Hurts whole lot Pain Location: low back Pain Descriptors / Indicators: Grimacing Pain Intervention(s): Monitored during session    Home Living                      Prior Function            PT Goals (current goals can now be found in the care plan section) Acute Rehab PT Goals Patient Stated Goal: finish rehab, be able to walk again, take care of myself and do everything I was doinig before Progress towards PT goals: Progressing toward goals (very slowly)    Frequency    Min 3X/week      PT Plan Current plan remains appropriate;Frequency needs to be updated    Co-evaluation PT/OT/SLP Co-Evaluation/Treatment: Yes Reason for Co-Treatment: Complexity of the patient's impairments (multi-system involvement);Necessary to address cognition/behavior during functional activity;For patient/therapist safety;To address functional/ADL transfers PT goals addressed during session: Mobility/safety with mobility;Balance;Proper use of DME;Strengthening/ROM OT goals addressed during session: ADL's and self-care      AM-PAC PT "6 Clicks" Mobility   Outcome Measure  Help needed turning from your back to your side while in a flat bed without using  bedrails?: A Little Help needed moving from lying on your back to sitting on the side of a flat bed without using bedrails?: A Lot Help needed moving to and from a bed to a chair (including a wheelchair)?: Total Help needed standing up from a chair using your arms (e.g., wheelchair or bedside chair)?: Total Help needed to walk in hospital room?: Total Help needed climbing 3-5 steps with a railing? : Total 6 Click Score: 9    End of Session   Activity Tolerance: Patient limited by fatigue;Patient limited by pain Patient left: in bed;with call bell/phone within reach Nurse Communication: Mobility status PT Visit Diagnosis: Muscle weakness (generalized) (M62.81);Unsteadiness on feet (R26.81);Other abnormalities of gait and mobility (R26.89);Pain Pain - part of body:  (back)     Time: 6237-6283 PT Time Calculation (min) (ACUTE ONLY): 52 min  Charges:  $Therapeutic Activity: 23-37 mins                     Zenaida Niece, PT, DPT Acute Rehabilitation Pager: [phone] Office [place] 12/24/2020, 2:34 PM

## 2020-12-25 LAB — AEROBIC/ANAEROBIC CULTURE W GRAM STAIN (SURGICAL/DEEP WOUND)

## 2020-12-25 LAB — GLUCOSE, CAPILLARY
Glucose-Capillary: 150 mg/dL — ABNORMAL HIGH (ref 70–99)
Glucose-Capillary: 209 mg/dL — ABNORMAL HIGH (ref 70–99)
Glucose-Capillary: 148 mg/dL — ABNORMAL HIGH (ref 70–99)
Glucose-Capillary: 244 mg/dL — ABNORMAL HIGH (ref 70–99)

## 2020-12-25 NOTE — Progress Notes (Signed)
Subjective: The patient is alert and pleasant.  She looks well.  She has no complaints.  Objective: Vital signs in last 24 hours: Temp:  [97.5 F (36.4 C)-98.7 F (37.1 C)] 98.7 F (37.1 C) (10/15 0806) Pulse Rate:  [70-75] 70 (10/15 0806) Resp:  [17-21] 21 (10/15 0806) BP: (123-176)/(67-105) 176/86 (10/15 0806) SpO2:  [99 %-100 %] 99 % (10/15 0806) Estimated body mass index is 33.28 kg/m as calculated from the following:   Height as of this encounter: 5\' 5"  (1.651 m).   Weight as of this encounter: 90.7 kg.   Intake/Output from previous day: 10/14 0701 - 10/15 0700 In: -  Out: 200 [Urine:200] Intake/Output this shift: No intake/output data recorded.  Physical exam the patient is alert and oriented.  Her lower extremity strength is grossly normal.  Lab Results: No results for input(s): WBC, HGB, HCT, PLT in the last 72 hours. BMET No results for input(s): NA, K, CL, CO2, GLUCOSE, BUN, CREATININE, CALCIUM in the last 72 hours.  Studies/Results: DG CHEST PORT 1 VIEW  Result Date: 12/23/2020 CLINICAL DATA:  Status post PICC line placement. EXAM: PORTABLE CHEST 1 VIEW patient is rotated. COMPARISON:  Chest x-ray 11/25/2020 FINDINGS: Interval placement of a right PICC line with tip overlying the expected region of the distal superior vena cava in the setting of patient rotation. Two lead cardiac pacemaker in similar position. The heart and mediastinal contours are unchanged. Aortic calcification. No focal consolidation. No pulmonary edema. No pleural effusion. No pneumothorax. No acute osseous abnormality. IMPRESSION: Interval placement of a right PICC line with tip overlying the expected region of the distal superior vena cava in the setting of patient rotation. Consider repeating with patient face in [place]. Electronically Signed   By: Iven Finn M.D.   On: 12/23/2020 23:48   [place] EKG SITE RITE  Result Date: 12/23/2020 If Site Rite image not attached, placement could not be  confirmed due to current cardiac rhythm.   Assessment/Plan: Proteus wound infection: The patient is progressing well with a wound VAC and IV antibiotics.  We are awaiting skilled nursing facility placement.  LOS: 5 days     Summer Hawkins 12/25/2020, 8:58 AM     Patient ID: Summer Hawkins, female   DOB: 11/12/1934, 86 y.o.   MRN: [id number]

## 2020-12-26 LAB — CULTURE, BLOOD (ROUTINE X 2)
Culture: NO GROWTH
Culture: NO GROWTH
Special Requests: ADEQUATE
Special Requests: ADEQUATE

## 2020-12-26 LAB — GLUCOSE, CAPILLARY
Glucose-Capillary: 176 mg/dL — ABNORMAL HIGH (ref 70–99)
Glucose-Capillary: 296 mg/dL — ABNORMAL HIGH (ref 70–99)
Glucose-Capillary: 147 mg/dL — ABNORMAL HIGH (ref 70–99)
Glucose-Capillary: 198 mg/dL — ABNORMAL HIGH (ref 70–99)

## 2020-12-26 NOTE — Plan of Care (Signed)
  Problem: Pain Management: Goal: Pain level will decrease Outcome: Progressing   

## 2020-12-26 NOTE — TOC Progression Note (Addendum)
Transition of Care [facility]) - Progression Note    Patient Details  Name: Summer Hawkins MRN: [id number] Date of Birth: [birth year]  Transition of Care [facility] [place]) CM/SW [place], [place] Phone Number: 12/26/2020, 2:24 PM  Clinical Narrative:     CSW tried to call Nicki with [facility] to check on SNF bed availability for tomorrow, no answer.CSW will follow up with Nicki in the am if patient medically  ready to check on SNF bed availability.CSW will continue to follow and assist with patients DC planning needs.  Expected Discharge Plan: [place] Barriers to Discharge: Continued Medical Work up  Expected Discharge Plan and Services Expected Discharge Plan: [place] Choice: [place] arrangements for the past 2 months: [place]                                       Social Determinants of Health (SDOH) Interventions    Readmission Risk Interventions No flowsheet data found.

## 2020-12-26 NOTE — Progress Notes (Signed)
Subjective: The patient is alert and pleasant.  She has no complaints.  Objective: Vital signs in last 24 hours: Temp:  [97.3 F (36.3 C)-98.9 F (37.2 C)] 98.1 F (36.7 C) (10/16 0753) Pulse Rate:  [71-87] 71 (10/16 0753) Resp:  [18-20] 20 (10/16 0753) BP: (136-170)/(72-89) 170/89 (10/16 0753) SpO2:  [98 %-99 %] 99 % (10/16 0753) Estimated body mass index is 33.28 kg/m as calculated from the following:   Height as of this encounter: 5\' 5"  (1.651 m).   Weight as of this encounter: 90.7 kg.   Intake/Output from previous day: 10/15 0701 - 10/16 0700 In: 1420 [P.O.:1320; IV Piggyback:100] Out: 1848 [Urine:1830] Intake/Output this shift: Total I/O In: 220 [P.O.:220] Out: 400 [Urine:400]  Physical exam the patient is alert and oriented.  She is moving her lower extremities well.  Lab Results: No results for input(s): WBC, HGB, HCT, PLT in the last 72 hours. BMET No results for input(s): NA, K, CL, CO2, GLUCOSE, BUN, CREATININE, CALCIUM in the last 72 hours.  Studies/Results: No results found.  Assessment/Plan: Wound infection: The patient is awaiting skilled nursing facility placement for prolonged IV antibiotics.  LOS: 6 days     [name] 12/26/2020, 9:56 AM     Patient ID: Summer Hawkins, female   DOB: Sep 03, 1934, 86 y.o.   MRN: [id number]

## 2020-12-27 LAB — SARS CORONAVIRUS 2 (TAT 6-24 HRS): SARS Coronavirus 2: NEGATIVE

## 2020-12-27 LAB — GLUCOSE, CAPILLARY
Glucose-Capillary: 228 mg/dL — ABNORMAL HIGH (ref 70–99)
Glucose-Capillary: 154 mg/dL — ABNORMAL HIGH (ref 70–99)
Glucose-Capillary: 232 mg/dL — ABNORMAL HIGH (ref 70–99)
Glucose-Capillary: 249 mg/dL — ABNORMAL HIGH (ref 70–99)
Glucose-Capillary: 241 mg/dL — ABNORMAL HIGH (ref 70–99)

## 2020-12-27 MED ORDER — OXYCODONE HCL 5 MG PO TABS: 5.0000 mg | ORAL_TABLET | ORAL | 0 refills | Status: DC | PRN

## 2020-12-27 NOTE — TOC Transition Note (Signed)
Transition of Care [facility]) - CM/SW Discharge Note   Patient Details  Name: Summer Hawkins MRN: [id number] Date of Birth: 13-Apr-1934  Transition of Care [facility]) CM/SW Contact:  Emeterio Reeve, LCSW Phone Number: 12/27/2020, 3:03 PM   Clinical Narrative:     Patient will DC to: [facility] Anticipated DC date: 12/27/20 Family notified: [name], daughter Transport by: Corey Harold     Per MD patient ready for DC to [facility]. RN, patient, patient's family, and facility notified of DC. Discharge Summary and FL2 sent to facility. DC packet on chart. Insurance [name] has been received and pt is covid negative. Ambulance transport requested for patient.    RN to call report to [phone]  CSW will sign off for now as social work intervention is no longer needed. Please consult [place] again if new needs arise.   Final next level of care: Skilled Nursing Facility Barriers to Discharge: No Barriers Identified   Patient Goals and CMS Choice   CMS Medicare.gov Compare Post Acute Care list provided to::  (NA-pt wants to return to [facility])    Discharge Placement              Patient chooses bed at: [facility] Patient to be transferred to facility by: [facility]   Patient and family notified of of transfer: 12/27/20  Discharge Plan and Services     Post Acute Care Choice: [place]                               Social Determinants of Health (SDOH) Interventions     Readmission Risk Interventions No flowsheet data found.   Emeterio Reeve, LCSW Clinical Social Worker

## 2020-12-27 NOTE — Progress Notes (Signed)
Physical Therapy Treatment Patient Details Name: Summer Hawkins MRN: [id number] DOB: 25-Nov-1934 Today's Date: 12/27/2020   History of Present Illness Pt is an 86 y/o female who presents with persistent wound drainge since lumbar fusion on 11/17/20. Pt underwent I&D and palcement of wound vac on 10/10. Pt also with recent admission due to UTI. PMH: breast cancer, CKD, DM, HTN    PT Comments    Patient progressing slowly towards PT goals. Reports soreness in back. Requires max A for bed mobility and cues for log roll technique; also Max A of 2 to perform multiple scoots (squat pivots) towards chair. Noted to have marked weakness in BLEs. Tolerated there ex sitting in chair. Reviewed back precautions. Continues to be appropriate for SNF. Will follow.    Recommendations for follow up therapy are one component of a multi-disciplinary discharge planning process, led by the attending physician.  Recommendations may be updated based on patient status, additional functional criteria and insurance authorization.  Follow Up Recommendations  SNF;Supervision/Assistance - 24 hour     Equipment Recommendations  None recommended by PT    Recommendations for Other Services       Precautions / Restrictions Precautions Precautions: Fall;Back Precaution Booklet Issued: Yes (comment) Precaution Comments: no brace needed per orders, lumbar wound vac Restrictions Weight Bearing Restrictions: No     Mobility  Bed Mobility Overal bed mobility: Needs Assistance Bed Mobility: Rolling;Sidelying to Sit Rolling: Mod assist Sidelying to sit: Max assist;HOB elevated       General bed mobility comments: Cues for log roll technique, assist rolling towards right using pad, with LEs and to elevate trunk (HOB elevated) to get upright. + lightheadedness    Transfers Overall transfer level: Needs assistance Equipment used: None Transfers: Squat Pivot Transfers     Squat pivot transfers: Max assist;+2  physical assistance     General transfer comment: Performed multiple partial stands during squat pivot transfer to scoot into chair on right; weakness BLEs. Able to clear buttocks with assist of 2 half way.  Ambulation/Gait             General Gait Details: Deferred   Stairs             Wheelchair Mobility    Modified Rankin (Stroke Patients Only)       Balance Overall balance assessment: Needs assistance Sitting-balance support: Feet supported;Bilateral upper extremity supported Sitting balance-Leahy Scale: Fair Sitting balance - Comments: reliant on UE support initially then progressing to no UE support for short periods   Standing balance support: During functional activity Standing balance-Leahy Scale: Zero                              Cognition Arousal/Alertness: Awake/alert Behavior During Therapy: WFL for tasks assessed/performed Overall Cognitive Status: No family/caregiver present to determine baseline cognitive functioning Area of Impairment: Memory                     Memory: Decreased recall of precautions         General Comments: more alert and aware this session; stating it is October, making jokes appropriately.      Exercises General Exercises - Lower Extremity Long Arc Quad: Strengthening;Both;10 reps;Seated    General Comments        Pertinent Vitals/Pain Pain Assessment: 0-10 Pain Score: 6  Pain Location: low back Pain Descriptors / Indicators: Grimacing;Sore Pain Intervention(s): Monitored during session;Repositioned;Limited activity within patient's  tolerance    Home Living                      Prior Function            PT Goals (current goals can now be found in the care plan section) Progress towards PT goals: Progressing toward goals (slowly)    Frequency    Min 3X/week      PT Plan Current plan remains appropriate    Co-evaluation              AM-PAC PT "6 Clicks"  Mobility   Outcome Measure  Help needed turning from your back to your side while in a flat bed without using bedrails?: A Lot Help needed moving from lying on your back to sitting on the side of a flat bed without using bedrails?: A Lot Help needed moving to and from a bed to a chair (including a wheelchair)?: Total Help needed standing up from a chair using your arms (e.g., wheelchair or bedside chair)?: Total Help needed to walk in hospital room?: Total Help needed climbing 3-5 steps with a railing? : Total 6 Click Score: 8    End of Session Equipment Utilized During Treatment: Gait belt Activity Tolerance: Patient tolerated treatment well Patient left: in chair;with call bell/phone within reach;with chair alarm set Nurse Communication: Mobility status PT Visit Diagnosis: Muscle weakness (generalized) (M62.81);Unsteadiness on feet (R26.81);Other abnormalities of gait and mobility (R26.89)     Time: 9326-7124 PT Time Calculation (min) (ACUTE ONLY): 20 min  Charges:  $Therapeutic Activity: 8-22 mins                     Marisa Severin, PT, DPT Acute Rehabilitation Services Pager [phone] Office [phone]      Summer Hawkins 12/27/2020, 2:37 PM

## 2020-12-27 NOTE — Consult Note (Signed)
[facility] Nurse wound follow up Wound type:Surgical incision to lumbar fusion site.   Measurement:7 cm x 4.6 cm x 1.5 cm  Wound bed: Ruddy red  bleeds with care.  Photo in chart.  Drainage (amount, consistency, odor) moderate serosanguinous  Canister 2/3 full.  Will not change as patient may be discharging back to [facility]: intact Dressing procedure/placement/frequency: Removed old dressing and cleansed.  1 piece black doam.  Drape applied and seal maintained at 125 mmHg  Change Mon/Wed/Fri.  Will follow at this time.   Domenic Moras MSN, RN, FNP-BC CWON Wound, Ostomy, Continence Nurse Pager [phone]

## 2020-12-27 NOTE — Progress Notes (Signed)
   Providing Compassionate, Quality Care - Together   Subjective: Patient resting comfortably in bed this morning. She reports she feels, "much better."  Objective: Vital signs in last 24 hours: Temp:  [98 F (36.7 C)-98.6 F (37 C)] 98.1 F (36.7 C) (10/17 0347) Pulse Rate:  [69-72] 69 (10/17 0347) Resp:  [19-21] 20 (10/17 0347) BP: (131-147)/(67-77) 142/77 (10/17 0347) SpO2:  [98 %-100 %] 98 % (10/17 0347)  Intake/Output from previous day: 10/16 0701 - 10/17 0700 In: 920 [P.O.:920] Out: 1700 [Urine:1700] Intake/Output this shift: No intake/output data recorded.  Alert and oriented x 4 PERRLA CN II-XII grossly intact MAE, Generalized weakness (baseline) Wound VAC in place; Incision is clean, dry, and intact    Assessment/Plan: Summer Hawkins underwent a lumbar fusion by Dr. Arnoldo Morale on 11/17/2020. She was discharged, but returned to the ED  on 11/28/2020 with a UTI. She presented back to the office with wound drainage that persisted in spite of oral antibiotics. She was admitted to [facility] for I and D of surgical wound and a wound VAC was placed.   LOS: 6 days   -Discharge when bed is available at [facility] -Patient will discharge on IV abx per ID recommendations -Continue to mobilize   Viona Gilmore, DNP, AGNP-C Nurse Practitioner  [facility] [place]. [address], [place], [place] [address] P: [phone]    F: [phone]  12/27/2020, 8:27 AM

## 2020-12-27 NOTE — Progress Notes (Signed)
Pt left transported by [facility] to [facility] with pt belongins vital signs stable

## 2020-12-27 NOTE — Discharge Summary (Addendum)
Physician Discharge Summary     Providing Compassionate, Quality Care - Together   Patient ID: Summer Hawkins MRN: [id number] DOB/AGE: 10-15-1934 87 y.o.  Admit date: 12/20/2020 Discharge date: 12/27/2020  Admission Diagnoses: Postoperative complication of skin involving drainage from surgical wound  Discharge Diagnoses:  Active Problems:   Postoperative complication of skin involving drainage from surgical wound   Discharged Condition: good  Hospital Course: Patient underwent an incision and drainage of her lumbar wound by Dr. Arnoldo Morale on 12/20/2020. She was admitted to 6N25 following recovery from anesthesia in the PACU. Her postoperative course has been uncomplicated. She has a wound VAC that will need to remain in place at discharge. She has worked with both physical and occupational therapies who feel the patient is ready for discharge back to [facility]. She is still requiring assistance for ambulation. She is tolerating a normal diet. She is not having any bowel or bladder dysfunction. Her pain is well-controlled with oral pain medication. She is ready for discharge to [facility].   Consults: rehabilitation medicine and WOC  Significant Diagnostic Studies: labs:   DG CHEST PORT 1 VIEW  Result Date: 12/23/2020 CLINICAL DATA:  Status post PICC line placement. EXAM: PORTABLE CHEST 1 VIEW patient is rotated. COMPARISON:  Chest x-ray 11/25/2020 FINDINGS: Interval placement of a right PICC line with tip overlying the expected region of the distal superior vena cava in the setting of patient rotation. Two lead cardiac pacemaker in similar position. The heart and mediastinal contours are unchanged. Aortic calcification. No focal consolidation. No pulmonary edema. No pleural effusion. No pneumothorax. No acute osseous abnormality. IMPRESSION: Interval placement of a right PICC line with tip overlying the expected region of the distal superior vena cava in the setting of patient rotation.  Consider repeating with patient face in [place]. Electronically Signed   By: Iven Finn M.D.   On: 12/23/2020 23:48   Treatments: surgery: Incision and drainage of wound with placement of wound VAC  Discharge Exam: Blood pressure 104/66, pulse 69, temperature 98 F (36.7 C), temperature source Oral, resp. rate 19, height 5\' 5"  (1.651 m), weight 90.7 kg, SpO2 95 %.    Disposition: Discharge disposition: 03-Skilled [place]       Discharge Instructions     [facility] pharmacist to adjust dose for Vancomycin, Aminoglycosides and other anti-infective therapies as requested by physician.   Complete by: As directed    [facility] to provide Cath Flo 2mg    Complete by: As directed    Administer for PICC line occlusion and as ordered by physician for other access device issues.   Anaphylaxis Kit: Provided to treat any anaphylactic reaction to the medication being provided to the patient if First Dose or when requested by physician   Complete by: As directed    Epinephrine 1mg /ml vial / amp: Administer 0.3mg  (0.15ml) subcutaneously once for moderate to severe anaphylaxis, nurse to call physician and pharmacy when reaction occurs and call 911 if needed for immediate care   Diphenhydramine 50mg /ml IV vial: Administer 25-50mg  IV/IM PRN for first dose reaction, rash, itching, mild reaction, nurse to call physician and pharmacy when reaction occurs   Sodium Chloride 0.9% NS 569ml IV: Administer if needed for hypovolemic blood pressure drop or as ordered by physician after call to physician with anaphylactic reaction   Change dressing on IV access line weekly and PRN   Complete by: As directed    Flush IV access with Sodium Chloride 0.9% and Heparin  10 units/ml or 100 units/ml   Complete by: As directed    Home infusion instructions - [facility]   Complete by: As directed    Instructions: Flush IV access with Sodium Chloride 0.9% and Heparin 10units/ml  or 100units/ml   Change dressing on IV access line: Weekly and PRN   Instructions Cath Flo $Remove'2mg'waQfoUt$ : Administer for PICC Line occlusion and as ordered by physician for other access device   [facility] pharmacist to adjust dose for: Vancomycin, Aminoglycosides and other anti-infective therapies as requested by physician   Method of administration may be changed at the discretion of home infusion pharmacist based upon assessment of the patient and/or caregiver's ability to self-administer the medication ordered   Complete by: As directed    Negative pressure wound therapy   Complete by: As directed    M-W-F dressing changes  Suction to 125 mmHg      Allergies as of 12/27/2020       Reactions   Bee Venom Shortness Of Breath, Nausea And Vomiting, Other (See Comments)   Makes the patient feel faint, also   Penicillins Anaphylaxis, Hives, Swelling, Other (See Comments)   Has patient had a PCN reaction causing immediate rash, facial/tongue/throat swelling, SOB or lightheadedness with hypotension: Yes Has patient had a PCN reaction causing severe rash involving mucus membranes or skin necrosis: No Has patient had a PCN reaction that required hospitalization: No Has patient had a PCN reaction occurring within the last 10 years: No If all of the above answers are "NO", then may proceed with Cephalosporin use.   Lisinopril Cough   Zocor [simvastatin] Other (See Comments)   memory changes   Liraglutide Rash, Other (See Comments)   Rash at injection site        Medication List     STOP taking these medications    doxycycline 100 MG tablet Commonly known as: VIBRA-TABS   HYDROcodone-acetaminophen 5-325 MG tablet Commonly known as: NORCO/VICODIN       TAKE these medications    acetaminophen 500 MG tablet Commonly known as: TYLENOL Take 1,000 mg by mouth every 8 (eight) hours as needed (pain/headache).   amLODipine 5 MG tablet Commonly known as: NORVASC Take 5 mg by  mouth in the morning. (1000)   anastrozole 1 MG tablet Commonly known as: ARIMIDEX Take 1 tablet (1 mg total) by mouth daily.   apixaban 2.5 MG Tabs tablet Commonly known as: Eliquis Take 1 tablet (2.5 mg total) by mouth 2 (two) times daily.   ceFAZolin  IVPB Commonly known as: ANCEF Inject 2 g into the vein every 12 (twelve) hours. Indication:  Wound Infection/Bacteremia First Dose: Yes Last Day of Therapy:  01/31/21 Labs - Once weekly:  CBC/D and BMP, Labs - Every other week:  ESR and CRP Method of administration: IV Push Method of administration may be changed at the discretion of home infusion pharmacist based upon assessment of the patient and/or caregiver's ability to self-administer the medication ordered.   cyclobenzaprine 10 MG tablet Commonly known as: FLEXERIL Take 1 tablet (10 mg total) by mouth 3 (three) times daily as needed for muscle spasms.   docusate sodium 100 MG capsule Commonly known as: COLACE Take 1 capsule (100 mg total) by mouth 2 (two) times daily.   ferrous sulfate 325 (65 FE) MG tablet Take 325 mg by mouth daily with breakfast. (0830)   insulin aspart 100 UNIT/ML injection Commonly known as: novoLOG 0-15 Units, Subcutaneous, 3 times daily with meals,  CBG < 70: Implement Hypoglycemia measures CBG 70 - 120: 0 units CBG 121 - 150: 2 units CBG 151 - 200: 3 units CBG 201 - 250: 5 units CBG 251 - 300: 8 units CBG 301 - 350: 11 units CBG 351 - 400: 15 units CBG > 400: call MD   insulin detemir 100 UNIT/ML injection Commonly known as: LEVEMIR Inject 0.12 mLs (12 Units total) into the skin at bedtime.   irbesartan 300 MG tablet Commonly known as: AVAPRO Take 300 mg by mouth daily.   levothyroxine 137 MCG tablet Commonly known as: SYNTHROID Take 137 mcg by mouth daily before breakfast. (0630)   metFORMIN 850 MG tablet Commonly known as: GLUCOPHAGE Take 850 mg by mouth 2 (two) times daily with a meal. (1200 & 1800)   metoprolol succinate 25 MG 24 hr  tablet Commonly known as: Toprol XL Take 1 tablet (25 mg total) by mouth daily.   multivitamin with minerals tablet Take 1 tablet by mouth in the morning. (1000)   nystatin cream Commonly known as: MYCOSTATIN Apply 1 application topically 2 (two) times daily.   oxyCODONE 5 MG immediate release tablet Commonly known as: Oxy IR/ROXICODONE Take 1 tablet (5 mg total) by mouth every 3 (three) hours as needed for moderate pain ((score 4 to 6)). What changed:  medication strength how much to take when to take this reasons to take this   polyethylene glycol 17 g packet Commonly known as: MIRALAX / GLYCOLAX Take 17 g by mouth daily at 4 PM. (1700)   saccharomyces boulardii 250 MG capsule Commonly known as: FLORASTOR Take 250 mg by mouth 2 (two) times daily.   senna 8.6 MG Tabs tablet Commonly known as: SENOKOT Take 2 tablets by mouth at bedtime. (2200)   spironolactone 25 MG tablet Commonly known as: ALDACTONE Take 25 mg by mouth in the morning. (1000)   timolol 0.5 % ophthalmic solution Commonly known as: TIMOPTIC Place 1 drop into both eyes every morning. (1000)               Discharge Care Instructions  (From admission, onward)           Start     Ordered   12/24/20 0000  Change dressing on IV access line weekly and PRN  (Home infusion instructions - [facility] )        12/24/20 1026            Follow-up Information     Newman Pies, MD. Schedule an appointment as soon as possible for a visit in 3 week(s).   Specialty: Neurosurgery Contact information: [address] [place] [address] [phone]                 Signed: Viona Gilmore, DNP, AGNP-C Nurse Practitioner  [facility] [place] [address], [address], [place], [place] [address] P: [phone]    F: [phone]  12/27/2020, 2:31 PM

## 2020-12-28 DIAGNOSIS — B964 Proteus (mirabilis) (morganii) as the cause of diseases classified elsewhere: Secondary | ICD-10-CM | POA: Diagnosis not present

## 2020-12-28 DIAGNOSIS — Z794 Long term (current) use of insulin: Secondary | ICD-10-CM | POA: Diagnosis not present

## 2020-12-28 DIAGNOSIS — I495 Sick sinus syndrome: Secondary | ICD-10-CM | POA: Diagnosis not present

## 2020-12-28 DIAGNOSIS — I69828 Other speech and language deficits following other cerebrovascular disease: Secondary | ICD-10-CM | POA: Diagnosis not present

## 2020-12-28 DIAGNOSIS — E1122 Type 2 diabetes mellitus with diabetic chronic kidney disease: Secondary | ICD-10-CM | POA: Diagnosis not present

## 2020-12-28 DIAGNOSIS — I5032 Chronic diastolic (congestive) heart failure: Secondary | ICD-10-CM | POA: Diagnosis not present

## 2020-12-28 DIAGNOSIS — R2681 Unsteadiness on feet: Secondary | ICD-10-CM | POA: Diagnosis not present

## 2020-12-28 DIAGNOSIS — E039 Hypothyroidism, unspecified: Secondary | ICD-10-CM | POA: Diagnosis not present

## 2020-12-28 DIAGNOSIS — E11319 Type 2 diabetes mellitus with unspecified diabetic retinopathy without macular edema: Secondary | ICD-10-CM | POA: Diagnosis not present

## 2020-12-28 DIAGNOSIS — N1832 Chronic kidney disease, stage 3b: Secondary | ICD-10-CM | POA: Diagnosis not present

## 2020-12-28 DIAGNOSIS — R2689 Other abnormalities of gait and mobility: Secondary | ICD-10-CM | POA: Diagnosis not present

## 2020-12-28 DIAGNOSIS — D631 Anemia in chronic kidney disease: Secondary | ICD-10-CM | POA: Diagnosis not present

## 2020-12-28 DIAGNOSIS — E669 Obesity, unspecified: Secondary | ICD-10-CM | POA: Diagnosis not present

## 2020-12-28 DIAGNOSIS — L89152 Pressure ulcer of sacral region, stage 2: Secondary | ICD-10-CM | POA: Diagnosis not present

## 2020-12-28 DIAGNOSIS — R41841 Cognitive communication deficit: Secondary | ICD-10-CM | POA: Diagnosis not present

## 2020-12-28 DIAGNOSIS — M4316 Spondylolisthesis, lumbar region: Secondary | ICD-10-CM | POA: Diagnosis not present

## 2020-12-28 DIAGNOSIS — Z981 Arthrodesis status: Secondary | ICD-10-CM | POA: Diagnosis not present

## 2020-12-28 DIAGNOSIS — Z9889 Other specified postprocedural states: Secondary | ICD-10-CM | POA: Diagnosis not present

## 2020-12-28 DIAGNOSIS — T8189XA Other complications of procedures, not elsewhere classified, initial encounter: Secondary | ICD-10-CM | POA: Diagnosis not present

## 2020-12-28 DIAGNOSIS — Z6832 Body mass index (BMI) 32.0-32.9, adult: Secondary | ICD-10-CM | POA: Diagnosis not present

## 2020-12-28 DIAGNOSIS — Z4789 Encounter for other orthopedic aftercare: Secondary | ICD-10-CM | POA: Diagnosis not present

## 2020-12-28 DIAGNOSIS — T8189XD Other complications of procedures, not elsewhere classified, subsequent encounter: Secondary | ICD-10-CM | POA: Diagnosis not present

## 2020-12-28 DIAGNOSIS — R262 Difficulty in walking, not elsewhere classified: Secondary | ICD-10-CM | POA: Diagnosis not present

## 2020-12-28 DIAGNOSIS — T8142XD Infection following a procedure, deep incisional surgical site, subsequent encounter: Secondary | ICD-10-CM | POA: Diagnosis not present

## 2020-12-28 DIAGNOSIS — I129 Hypertensive chronic kidney disease with stage 1 through stage 4 chronic kidney disease, or unspecified chronic kidney disease: Secondary | ICD-10-CM | POA: Diagnosis not present

## 2020-12-28 DIAGNOSIS — I480 Paroxysmal atrial fibrillation: Secondary | ICD-10-CM | POA: Diagnosis not present

## 2020-12-28 DIAGNOSIS — M6281 Muscle weakness (generalized): Secondary | ICD-10-CM | POA: Diagnosis not present

## 2021-01-03 DIAGNOSIS — T8189XA Other complications of procedures, not elsewhere classified, initial encounter: Secondary | ICD-10-CM | POA: Diagnosis not present

## 2021-01-05 ENCOUNTER — Ambulatory Visit: Payer: Medicare Other | Admitting: Hematology and Oncology

## 2021-01-06 DIAGNOSIS — Z9889 Other specified postprocedural states: Secondary | ICD-10-CM | POA: Diagnosis not present

## 2021-01-06 DIAGNOSIS — E1122 Type 2 diabetes mellitus with diabetic chronic kidney disease: Secondary | ICD-10-CM | POA: Diagnosis not present

## 2021-01-06 DIAGNOSIS — R262 Difficulty in walking, not elsewhere classified: Secondary | ICD-10-CM | POA: Diagnosis not present

## 2021-01-06 DIAGNOSIS — I5032 Chronic diastolic (congestive) heart failure: Secondary | ICD-10-CM | POA: Diagnosis not present

## 2021-01-11 ENCOUNTER — Inpatient Hospital Stay: Payer: Medicare Other | Admitting: Infectious Diseases

## 2021-01-11 NOTE — Progress Notes (Incomplete)
Patient Care Team: Tisovec, Fransico Him, MD as PCP - General (Internal Medicine) Burnell Blanks, MD as PCP - Cardiology (Cardiology) Evans Lance, MD as PCP - Electrophysiology (Cardiology) Jovita Kussmaul, MD as Consulting Physician (General Surgery) Nicholas Lose, MD as Consulting Physician (Hematology and Oncology) Delice Bison Charlestine Massed, NP as Nurse Practitioner (Hematology and Oncology)  DIAGNOSIS: No diagnosis found.  SUMMARY OF ONCOLOGIC HISTORY: Oncology History  Breast cancer of upper-outer quadrant of left female breast ([place])  12/15/2015 Initial Diagnosis   Left breast biopsy: Grade 1-2 IDC with calcifications with DCIS, ER 100%, PR 90%, Ki-67 5%, HER-2 negative ratio 1.45; left breast mass 1.9 x 1 x 1.1 cm; T1 CN 0 stage IA clinical stage   01/19/2016 Surgery   Left lumpectomy: IDC with calcifications, 1.8 cm, grade 2, DCIS with calcifications, 0/1 lymph node negative, T1 CN 0 stage IA   01/26/2016 Oncotype testing   Oncotype DX score 8: risk of recurrence 6%    Radiation Therapy   Refused radiation therapy   04/13/2016 -  Anti-estrogen oral therapy   Anastrozole 1 mg daily     CHIEF COMPLIANT: Follow-up of left breast cancer on anastrozole therapy  INTERVAL HISTORY: Summer Hawkins is a 86 y.o. with above-mentioned history of left breast cancer treated with lumpectomy, declined radiation, and is currently on anti-estrogen therapy with anastrozole. She presents to the clinic today for annual follow-up.   ALLERGIES:  is allergic to bee venom, penicillins, lisinopril, zocor [simvastatin], and liraglutide.  MEDICATIONS:  Current Outpatient Medications  Medication Sig Dispense Refill   acetaminophen (TYLENOL) 500 MG tablet Take 1,000 mg by mouth every 8 (eight) hours as needed (pain/headache).     amLODipine (NORVASC) 5 MG tablet Take 5 mg by mouth in the morning. (1000)     anastrozole (ARIMIDEX) 1 MG tablet Take 1 tablet (1 mg total) by mouth daily. 90  tablet 3   apixaban (ELIQUIS) 2.5 MG TABS tablet Take 1 tablet (2.5 mg total) by mouth 2 (two) times daily.     ceFAZolin (ANCEF) IVPB Inject 2 g into the vein every 12 (twelve) hours. Indication:  Wound Infection/Bacteremia First Dose: Yes Last Day of Therapy:  01/31/21 Labs - Once weekly:  CBC/D and BMP, Labs - Every other week:  ESR and CRP Method of administration: IV Push Method of administration may be changed at the discretion of home infusion pharmacist based upon assessment of the patient and/or caregiver's ability to self-administer the medication ordered. 76 Units 0   cyclobenzaprine (FLEXERIL) 10 MG tablet Take 1 tablet (10 mg total) by mouth 3 (three) times daily as needed for muscle spasms. 30 tablet 0   docusate sodium (COLACE) 100 MG capsule Take 1 capsule (100 mg total) by mouth 2 (two) times daily. 10 capsule 0   ferrous sulfate 325 (65 FE) MG tablet Take 325 mg by mouth daily with breakfast. (0830)     insulin aspart (NOVOLOG) 100 UNIT/ML injection 0-15 Units, Subcutaneous, 3 times daily with meals, CBG < 70: Implement Hypoglycemia measures CBG 70 - 120: 0 units CBG 121 - 150: 2 units CBG 151 - 200: 3 units CBG 201 - 250: 5 units CBG 251 - 300: 8 units CBG 301 - 350: 11 units CBG 351 - 400: 15 units CBG > 400: call MD 10 mL 11   insulin detemir (LEVEMIR) 100 UNIT/ML injection Inject 0.12 mLs (12 Units total) into the skin at bedtime. 10 mL 11   irbesartan (AVAPRO) 300  MG tablet Take 300 mg by mouth daily.     levothyroxine (SYNTHROID) 137 MCG tablet Take 137 mcg by mouth daily before breakfast. (0630)     metFORMIN (GLUCOPHAGE) 850 MG tablet Take 850 mg by mouth 2 (two) times daily with a meal. (1200 & 1800)     metoprolol succinate (TOPROL XL) 25 MG 24 hr tablet Take 1 tablet (25 mg total) by mouth daily. 90 tablet 3   Multiple Vitamins-Minerals (MULTIVITAMIN WITH MINERALS) tablet Take 1 tablet by mouth in the morning. (1000)     nystatin cream (MYCOSTATIN) Apply 1 application  topically 2 (two) times daily.     oxyCODONE (OXY IR/ROXICODONE) 5 MG immediate release tablet Take 1 tablet (5 mg total) by mouth every 3 (three) hours as needed for moderate pain ((score 4 to 6)). 30 tablet 0   polyethylene glycol (MIRALAX / GLYCOLAX) 17 g packet Take 17 g by mouth daily at 4 PM. (1700)     saccharomyces boulardii (FLORASTOR) 250 MG capsule Take 250 mg by mouth 2 (two) times daily.     senna (SENOKOT) 8.6 MG TABS tablet Take 2 tablets by mouth at bedtime. (2200)     spironolactone (ALDACTONE) 25 MG tablet Take 25 mg by mouth in the morning. (1000)     timolol (TIMOPTIC) 0.5 % ophthalmic solution Place 1 drop into both eyes every morning. (1000)     No current facility-administered medications for this visit.    PHYSICAL EXAMINATION: ECOG PERFORMANCE STATUS: {CHL ONC ECOG PS:[phone]}  There were no vitals filed for this visit. There were no vitals filed for this visit.  BREAST:*** No palpable masses or nodules in either right or left breasts. No palpable axillary supraclavicular or infraclavicular adenopathy no breast tenderness or nipple discharge. (exam performed in the presence of a chaperone)  LABORATORY DATA:  I have reviewed the data as listed CMP Latest Ref Rng & Units 12/21/2020 12/20/2020 11/30/2020  Glucose 70 - 99 mg/dL 518(U) 594(J) 962(J)  BUN 8 - 23 mg/dL 81(J) 68(S) 79(B)  Creatinine 0.44 - 1.00 mg/dL 6.57(N) 8.01(Z) 0.17(H)  Sodium 135 - 145 mmol/L 141 145 131(L)  Potassium 3.5 - 5.1 mmol/L 3.5 4.3 4.5  Chloride 98 - 111 mmol/L 110 111 96(L)  CO2 22 - 32 mmol/L 20(L) - 21(L)  Calcium 8.9 - 10.3 mg/dL 9.3 - 8.8(L)  Total Protein 6.5 - 8.1 g/dL - - -  Total Bilirubin 0.3 - 1.2 mg/dL - - -  Alkaline Phos 38 - 126 U/L - - -  AST 15 - 41 U/L - - -  ALT 0 - 44 U/L - - -    Lab Results  Component Value Date   WBC 10.0 12/21/2020   HGB 10.7 (L) 12/21/2020   HCT 35.1 (L) 12/21/2020   MCV 94.9 12/21/2020   PLT 373 12/21/2020   NEUTROABS 14.8 (H)  11/26/2020    ASSESSMENT & PLAN:  No problem-specific Assessment & Plan notes found for this encounter.    No orders of the defined types were placed in this encounter.  The patient has a good understanding of the overall plan. she agrees with it. she will call with any problems that may develop before the next visit here.  Total time spent: *** mins including face to face time and time spent for planning, charting and coordination of care  Sabas Sous, MD, MPH 01/11/2021  I, Alda Ponder, am acting as scribe for Dr. Serena Croissant.  {insert scribe attestation}

## 2021-01-12 ENCOUNTER — Other Ambulatory Visit: Payer: Self-pay | Admitting: *Deleted

## 2021-01-12 ENCOUNTER — Inpatient Hospital Stay: Payer: Medicare Other | Attending: Hematology and Oncology | Admitting: Hematology and Oncology

## 2021-01-12 NOTE — Patient Outreach (Signed)
THN Post- Acute Care Coordinator follow up. Communication sent to [facility] SW on 01/11/21 to inquire about transition plans.  Will continue to follow.   Marthenia Rolling, MSN, RN,BSN [place] Acute Care Coordinator [phone] [facility]) [phone]  (Toll free office)

## 2021-01-12 NOTE — Assessment & Plan Note (Deleted)
Breast cancer of upper-outer quadrant of left female breast ([place]) 12/10/2015: Left breast biopsy: Grade 1-2 IDC with calcifications with DCIS, ER 100%, PR 90%, Ki-67 5%, HER-2 negative ratio 1.45; left breast mass 1.9 x 1 x 1.1 cm; T1 CN 0 stage IA clinical stage 01/19/2016 Left lumpectomy: IDC with calcifications, 1.8 cms, grade 2, DCIS with calcifications, 0/1 lymph node negative, T1 CN 0 stage IA Oncotype DX score 8: Risk of recurrence 6% Patient refusedradiation therapy  Current treatment:No role of systemic chemotherapy adjuvant antiestrogen therapy with anastrozole 1 mg by mouth daily 5 years,startedFebruary 2018.  Anastrozoletoxicities: Denies any hot flashes or arthralgias or myalgias.  Surveillance: 1.Breast exam  01/12/2021:Benign 2.Mammogram10/14/2021: Breast density category B, no evidence of breast cancer. Patient is due for mammogram.  Hospitalization for symptomatic bradycardia and atrial fibrillation 09/25/2017 to 09/27/2017;pacemaker implantation September 2020.  Hospitalization 12/20/2020-12/27/2020 incision and drainage of lumbar wound with Dr. Arnoldo Morale  Return to clinic on an as-needed basis

## 2021-01-13 ENCOUNTER — Other Ambulatory Visit: Payer: Self-pay | Admitting: *Deleted

## 2021-01-13 NOTE — Patient Outreach (Signed)
THN Post- Acute Care Coordinator follow up. Mrs. Mikita resides in [facility].  Update received from [place] indicating transition plan is uncertain. Continues with iv antibiotics and wound vac.  Will continue to follow for potential [facility] Care Management services.   Marthenia Rolling, MSN, RN,BSN [place] Acute Care Coordinator [phone] [facility]) [phone]  (Toll free office)

## 2021-01-26 ENCOUNTER — Telehealth: Payer: Self-pay

## 2021-01-26 ENCOUNTER — Other Ambulatory Visit: Payer: Self-pay | Admitting: *Deleted

## 2021-01-26 NOTE — Telephone Encounter (Signed)
Called patient to get an appointment scheduled with Dr. West Bali soon, voicemail is full.

## 2021-01-26 NOTE — Patient Outreach (Signed)
THN  Post- Acute Care Coordinator follow up. Per [facility] Mrs. Cina resides in [facility].   Communication sent to [place] [facility] to inquire about transition plans.  Will continue to follow for potential [facility] Care Management needs.    Marthenia Rolling, MSN, RN,BSN [place] Acute Care Coordinator [phone] [facility]) [phone]  (Toll free office)

## 2021-01-28 ENCOUNTER — Other Ambulatory Visit: Payer: Self-pay | Admitting: *Deleted

## 2021-01-28 NOTE — Patient Outreach (Signed)
THN Post- Acute Care Coordinator follow up. Member screened for potential [facility] Care Management needs.   Update received from [place] [facility] SW, indicating transition plans are unclear at this time. Will likely have scheduled meeting next week.   Will continue to follow while member resides in SNF.    Marthenia Rolling, MSN, RN,BSN [place] Acute Care Coordinator [phone] [facility]) [phone]  (Toll free office)

## 2021-02-09 DIAGNOSIS — N1832 Chronic kidney disease, stage 3b: Secondary | ICD-10-CM | POA: Diagnosis not present

## 2021-02-09 DIAGNOSIS — Z794 Long term (current) use of insulin: Secondary | ICD-10-CM | POA: Diagnosis not present

## 2021-02-09 DIAGNOSIS — I5032 Chronic diastolic (congestive) heart failure: Secondary | ICD-10-CM | POA: Diagnosis not present

## 2021-02-09 DIAGNOSIS — E1122 Type 2 diabetes mellitus with diabetic chronic kidney disease: Secondary | ICD-10-CM | POA: Diagnosis not present

## 2021-02-10 DIAGNOSIS — R262 Difficulty in walking, not elsewhere classified: Secondary | ICD-10-CM | POA: Diagnosis not present

## 2021-02-10 DIAGNOSIS — E669 Obesity, unspecified: Secondary | ICD-10-CM | POA: Diagnosis not present

## 2021-02-10 DIAGNOSIS — Z4789 Encounter for other orthopedic aftercare: Secondary | ICD-10-CM | POA: Diagnosis not present

## 2021-02-10 DIAGNOSIS — I129 Hypertensive chronic kidney disease with stage 1 through stage 4 chronic kidney disease, or unspecified chronic kidney disease: Secondary | ICD-10-CM | POA: Diagnosis not present

## 2021-02-10 DIAGNOSIS — E1122 Type 2 diabetes mellitus with diabetic chronic kidney disease: Secondary | ICD-10-CM | POA: Diagnosis not present

## 2021-02-10 DIAGNOSIS — I5032 Chronic diastolic (congestive) heart failure: Secondary | ICD-10-CM | POA: Diagnosis not present

## 2021-02-10 DIAGNOSIS — E039 Hypothyroidism, unspecified: Secondary | ICD-10-CM | POA: Diagnosis not present

## 2021-02-10 DIAGNOSIS — M4316 Spondylolisthesis, lumbar region: Secondary | ICD-10-CM | POA: Diagnosis not present

## 2021-02-10 DIAGNOSIS — D631 Anemia in chronic kidney disease: Secondary | ICD-10-CM | POA: Diagnosis not present

## 2021-02-10 DIAGNOSIS — M6281 Muscle weakness (generalized): Secondary | ICD-10-CM | POA: Diagnosis not present

## 2021-02-10 DIAGNOSIS — E11319 Type 2 diabetes mellitus with unspecified diabetic retinopathy without macular edema: Secondary | ICD-10-CM | POA: Diagnosis not present

## 2021-02-10 DIAGNOSIS — T8142XD Infection following a procedure, deep incisional surgical site, subsequent encounter: Secondary | ICD-10-CM | POA: Diagnosis not present

## 2021-02-10 DIAGNOSIS — Z981 Arthrodesis status: Secondary | ICD-10-CM | POA: Diagnosis not present

## 2021-02-10 DIAGNOSIS — Z794 Long term (current) use of insulin: Secondary | ICD-10-CM | POA: Diagnosis not present

## 2021-02-10 DIAGNOSIS — B964 Proteus (mirabilis) (morganii) as the cause of diseases classified elsewhere: Secondary | ICD-10-CM | POA: Diagnosis not present

## 2021-02-10 DIAGNOSIS — R2681 Unsteadiness on feet: Secondary | ICD-10-CM | POA: Diagnosis not present

## 2021-02-10 DIAGNOSIS — R2689 Other abnormalities of gait and mobility: Secondary | ICD-10-CM | POA: Diagnosis not present

## 2021-02-10 DIAGNOSIS — I480 Paroxysmal atrial fibrillation: Secondary | ICD-10-CM | POA: Diagnosis not present

## 2021-02-10 DIAGNOSIS — N1832 Chronic kidney disease, stage 3b: Secondary | ICD-10-CM | POA: Diagnosis not present

## 2021-02-18 DIAGNOSIS — M4316 Spondylolisthesis, lumbar region: Secondary | ICD-10-CM | POA: Diagnosis not present

## 2021-02-18 DIAGNOSIS — Z6832 Body mass index (BMI) 32.0-32.9, adult: Secondary | ICD-10-CM | POA: Diagnosis not present

## 2021-02-22 ENCOUNTER — Encounter: Payer: Self-pay | Admitting: *Deleted

## 2021-02-22 ENCOUNTER — Other Ambulatory Visit: Payer: Self-pay | Admitting: *Deleted

## 2021-02-22 NOTE — Congregational Nurse Program (Signed)
121322/ vistied Summer Hawkins in the rehab at [facility].  Plan is for her to go home on Thursday or Friday of this week. Has caregiver set up and they will trained by the snf on Wednesday.  Family are coming in for the holidays through January.  [name] is able to walk short distances now and get up out of chiars with min. Assistance.  She is able to get up and down from the bed as well. Wound vac is still place .  Spinal incision and wound are healing well. [facility] and [facility] will attend to the wound vac.  Spend apoorox 45 mnutes going over needs for home setting.  Has rolling walker and over the commode chair  The home is a one level ranch style home.  Kitchen and living areas are easily entered. Knows to remove any scatter rugs from living areas.  Had a pleasant visit and will revisit once she is home.

## 2021-02-22 NOTE — Patient Outreach (Signed)
THN Post- Acute Care Coordinator follow up. [name] resides in [facility]. Screened for potential [facility] Care Management services.   Communication sent to [facility] SW to inquire about transition plans. Will likely not need additional follow up due to member being active with [facility].   Will collaborate with [place] and will continue to follow while in SNF.   Summer Rolling, MSN, RN,BSN [place] Acute Care Coordinator [phone] [facility]) [phone]  (Toll free office)

## 2021-02-23 ENCOUNTER — Other Ambulatory Visit: Payer: Self-pay | Admitting: *Deleted

## 2021-02-23 NOTE — Patient Outreach (Signed)
THN Post- Acute Care Coordinator follow up. Member screened for potential [facility] Care Management needs. Mrs. Nienhuis resides in [facility].   Update received from [place] [place] [facility] SW indicating she will follow up on home health arrangements, caregivers, and wound vac.  Telephone call made to Mrs. [place] [phone].  Patient identifiers explained. Mrs. Ferrero reports her transition plan is to return home with home health and caregiver assistance. States she she has caregivers arranged. She is also active with [place] Nurse program.   Discussed [place] Management services. Mrs. Lye states she will have enough help and that [place] Management services will not be warranted.   Collaboration with Suanne Marker, Mrs. [facility] RN, to make aware of writer's conversation with Mrs. Doig and that Mrs. Ysaguirre declined [place] Management services.  Suanne Marker recommends that Probation officer follow back up with Mrs. Rosa regarding [facility] Care Management due to Mrs. Olheiser's complex needs when she returns home.   Attempted to call Mrs. Gadea back. No answer. Writer to follow up at later time or post SNF transition to discuss [place] Management services again.    Marthenia Rolling, MSN, RN,BSN [place] Acute Care Coordinator [phone] [facility]) [phone]  (Toll free office)

## 2021-02-28 DIAGNOSIS — T8189XD Other complications of procedures, not elsewhere classified, subsequent encounter: Secondary | ICD-10-CM | POA: Diagnosis not present

## 2021-03-01 DIAGNOSIS — I5032 Chronic diastolic (congestive) heart failure: Secondary | ICD-10-CM | POA: Diagnosis not present

## 2021-03-01 DIAGNOSIS — E1122 Type 2 diabetes mellitus with diabetic chronic kidney disease: Secondary | ICD-10-CM | POA: Diagnosis not present

## 2021-03-01 DIAGNOSIS — N1832 Chronic kidney disease, stage 3b: Secondary | ICD-10-CM | POA: Diagnosis not present

## 2021-03-01 DIAGNOSIS — Z794 Long term (current) use of insulin: Secondary | ICD-10-CM | POA: Diagnosis not present

## 2021-03-08 DIAGNOSIS — Z950 Presence of cardiac pacemaker: Secondary | ICD-10-CM | POA: Diagnosis not present

## 2021-03-08 DIAGNOSIS — Z794 Long term (current) use of insulin: Secondary | ICD-10-CM | POA: Diagnosis not present

## 2021-03-08 DIAGNOSIS — E119 Type 2 diabetes mellitus without complications: Secondary | ICD-10-CM | POA: Diagnosis not present

## 2021-03-08 DIAGNOSIS — Z48811 Encounter for surgical aftercare following surgery on the nervous system: Secondary | ICD-10-CM | POA: Diagnosis not present

## 2021-03-08 DIAGNOSIS — E669 Obesity, unspecified: Secondary | ICD-10-CM | POA: Diagnosis not present

## 2021-03-08 DIAGNOSIS — H409 Unspecified glaucoma: Secondary | ICD-10-CM | POA: Diagnosis not present

## 2021-03-08 DIAGNOSIS — Z7984 Long term (current) use of oral hypoglycemic drugs: Secondary | ICD-10-CM | POA: Diagnosis not present

## 2021-03-08 DIAGNOSIS — Z7901 Long term (current) use of anticoagulants: Secondary | ICD-10-CM | POA: Diagnosis not present

## 2021-03-08 DIAGNOSIS — E079 Disorder of thyroid, unspecified: Secondary | ICD-10-CM | POA: Diagnosis not present

## 2021-03-08 DIAGNOSIS — C50919 Malignant neoplasm of unspecified site of unspecified female breast: Secondary | ICD-10-CM | POA: Diagnosis not present

## 2021-03-08 DIAGNOSIS — D649 Anemia, unspecified: Secondary | ICD-10-CM | POA: Diagnosis not present

## 2021-03-08 DIAGNOSIS — Z9181 History of falling: Secondary | ICD-10-CM | POA: Diagnosis not present

## 2021-03-08 DIAGNOSIS — I10 Essential (primary) hypertension: Secondary | ICD-10-CM | POA: Diagnosis not present

## 2021-03-09 ENCOUNTER — Other Ambulatory Visit: Payer: Self-pay | Admitting: *Deleted

## 2021-03-09 DIAGNOSIS — H353132 Nonexudative age-related macular degeneration, bilateral, intermediate dry stage: Secondary | ICD-10-CM | POA: Diagnosis not present

## 2021-03-09 DIAGNOSIS — I10 Essential (primary) hypertension: Secondary | ICD-10-CM

## 2021-03-09 DIAGNOSIS — H04123 Dry eye syndrome of bilateral lacrimal glands: Secondary | ICD-10-CM | POA: Diagnosis not present

## 2021-03-09 DIAGNOSIS — H401134 Primary open-angle glaucoma, bilateral, indeterminate stage: Secondary | ICD-10-CM | POA: Diagnosis not present

## 2021-03-09 NOTE — Patient Outreach (Signed)
THN Post- Acute Care Coordinator follow up. Verified in [facility] that Mrs. Summer Hawkins transitioned home from [facility] on 03/04/21. She has [facility] home health.  Telephone call made to Mrs. Summer Hawkins [phone] to re-discuss [facility] Care Management services. Patient identifiers confirmed. Mrs. Summer Hawkins reports her son is staying with her from [place] until either Saturday or Sunday. States she no longer has wound vac. States home health RN is managing dressing changes. States she has friends/family to transport to MD appointments. States she only wants to eat soup and sandwiches for the most part. Therefore, neither transportation or meals are a concern for her. Has follow up scheduled with PCP.   Mrs. Summer Hawkins lives alone. However, she states she has supportive friends and family. She also endorses that she has a medical alert system.   Discussed [facility] Care Management services. Explained [facility] Care Management will not interfere or replace services provided by home health or [place] Nurse. Mrs. Summer Hawkins is agreeable to [place] Management follow up. Expressed appreciation of the call.   Will send message to [place] Nurse to make aware writer will make referral to [place] for complex case management.    Summer Rolling, MSN, RN,BSN [place] Acute Care Coordinator [phone] [facility]) [phone]  (Toll free office)

## 2021-03-10 ENCOUNTER — Other Ambulatory Visit: Payer: Self-pay | Admitting: *Deleted

## 2021-03-10 NOTE — Patient Outreach (Signed)
[facility]) Care Management  03/10/2021  [place] 04-29-1934 [id number]   Hospital referral received from [facility] for for complex case management. Assigned to Valente David, RN Care Coordinator for a follow up.  Thank you, [place] Care Management Assistant

## 2021-03-10 NOTE — Patient Outreach (Signed)
[facility]) Care Management  03/10/2021  Summer Hawkins [birth year] [id number]   Referral Date: 12/29 Referral Source: Post acute care coordinator Date of Admission: 10/17 from hospital Diagnosis: Post op wound infection requiring debridement Date of Discharge: 12/23 from SNF Facility: [place]: Traditional Medicare  Outreach attempt #1, unsuccessful to preferred number confirmed by post acute care coordinator, HIPAA compliant voice message left.    Plan: RN CM will send outreach letter and follow up within the next 3-4 business days.  Valente David, RN, MSN, [place] Manager [phone]

## 2021-03-11 DIAGNOSIS — E119 Type 2 diabetes mellitus without complications: Secondary | ICD-10-CM | POA: Diagnosis not present

## 2021-03-11 DIAGNOSIS — D649 Anemia, unspecified: Secondary | ICD-10-CM | POA: Diagnosis not present

## 2021-03-11 DIAGNOSIS — I10 Essential (primary) hypertension: Secondary | ICD-10-CM | POA: Diagnosis not present

## 2021-03-11 DIAGNOSIS — Z48811 Encounter for surgical aftercare following surgery on the nervous system: Secondary | ICD-10-CM | POA: Diagnosis not present

## 2021-03-11 DIAGNOSIS — Z7901 Long term (current) use of anticoagulants: Secondary | ICD-10-CM | POA: Diagnosis not present

## 2021-03-11 DIAGNOSIS — C50919 Malignant neoplasm of unspecified site of unspecified female breast: Secondary | ICD-10-CM | POA: Diagnosis not present

## 2021-03-15 ENCOUNTER — Other Ambulatory Visit: Payer: Self-pay | Admitting: *Deleted

## 2021-03-15 DIAGNOSIS — I10 Essential (primary) hypertension: Secondary | ICD-10-CM | POA: Diagnosis not present

## 2021-03-15 DIAGNOSIS — D649 Anemia, unspecified: Secondary | ICD-10-CM | POA: Diagnosis not present

## 2021-03-15 DIAGNOSIS — Z48811 Encounter for surgical aftercare following surgery on the nervous system: Secondary | ICD-10-CM | POA: Diagnosis not present

## 2021-03-15 DIAGNOSIS — Z7901 Long term (current) use of anticoagulants: Secondary | ICD-10-CM | POA: Diagnosis not present

## 2021-03-15 DIAGNOSIS — E119 Type 2 diabetes mellitus without complications: Secondary | ICD-10-CM | POA: Diagnosis not present

## 2021-03-15 DIAGNOSIS — C50919 Malignant neoplasm of unspecified site of unspecified female breast: Secondary | ICD-10-CM | POA: Diagnosis not present

## 2021-03-15 NOTE — Patient Outreach (Signed)
[facility]) Care Management  03/15/2021  [place] [birth year] [id number]   Referral Date: 12/29 Referral Source: Post acute care coordinator Date of Admission: 10/17 from hospital Diagnosis: Post op wound infection requiring debridement Date of Discharge: 12/23 from SNF Facility: [place]: Traditional Medicare  Outreach attempt #2, successful.  Identity verified.  This care manager introduced self and stated purpose of call.  [facility] care management services explained.  She declines offer for services, stating that she already has home health, next visit scheduled for tomorrow.  Difference between home health and [facility] discussed, she continues to decline services.  Encouraged member to review brochure mailed to the home last week prior to making final decision, she agrees.   Plan: RN CM will follow up with member within the next 2 weeks to offer [facility] services again.  If continue to decline, will close case at that time.  Valente David, RN, MSN, [place] Manager [phone]

## 2021-03-16 ENCOUNTER — Ambulatory Visit: Payer: Self-pay | Admitting: *Deleted

## 2021-03-16 DIAGNOSIS — E119 Type 2 diabetes mellitus without complications: Secondary | ICD-10-CM | POA: Diagnosis not present

## 2021-03-16 DIAGNOSIS — Z7901 Long term (current) use of anticoagulants: Secondary | ICD-10-CM | POA: Diagnosis not present

## 2021-03-16 DIAGNOSIS — C50919 Malignant neoplasm of unspecified site of unspecified female breast: Secondary | ICD-10-CM | POA: Diagnosis not present

## 2021-03-16 DIAGNOSIS — I10 Essential (primary) hypertension: Secondary | ICD-10-CM | POA: Diagnosis not present

## 2021-03-16 DIAGNOSIS — Z48811 Encounter for surgical aftercare following surgery on the nervous system: Secondary | ICD-10-CM | POA: Diagnosis not present

## 2021-03-16 DIAGNOSIS — D649 Anemia, unspecified: Secondary | ICD-10-CM | POA: Diagnosis not present

## 2021-03-18 ENCOUNTER — Other Ambulatory Visit: Payer: Self-pay | Admitting: Cardiovascular Disease

## 2021-03-18 DIAGNOSIS — Z7901 Long term (current) use of anticoagulants: Secondary | ICD-10-CM | POA: Diagnosis not present

## 2021-03-18 DIAGNOSIS — E119 Type 2 diabetes mellitus without complications: Secondary | ICD-10-CM | POA: Diagnosis not present

## 2021-03-18 DIAGNOSIS — D649 Anemia, unspecified: Secondary | ICD-10-CM | POA: Diagnosis not present

## 2021-03-18 DIAGNOSIS — I10 Essential (primary) hypertension: Secondary | ICD-10-CM | POA: Diagnosis not present

## 2021-03-18 DIAGNOSIS — C50919 Malignant neoplasm of unspecified site of unspecified female breast: Secondary | ICD-10-CM | POA: Diagnosis not present

## 2021-03-18 DIAGNOSIS — Z48811 Encounter for surgical aftercare following surgery on the nervous system: Secondary | ICD-10-CM | POA: Diagnosis not present

## 2021-03-21 DIAGNOSIS — C50919 Malignant neoplasm of unspecified site of unspecified female breast: Secondary | ICD-10-CM | POA: Diagnosis not present

## 2021-03-21 DIAGNOSIS — E119 Type 2 diabetes mellitus without complications: Secondary | ICD-10-CM | POA: Diagnosis not present

## 2021-03-21 DIAGNOSIS — Z48811 Encounter for surgical aftercare following surgery on the nervous system: Secondary | ICD-10-CM | POA: Diagnosis not present

## 2021-03-21 DIAGNOSIS — D649 Anemia, unspecified: Secondary | ICD-10-CM | POA: Diagnosis not present

## 2021-03-21 DIAGNOSIS — I10 Essential (primary) hypertension: Secondary | ICD-10-CM | POA: Diagnosis not present

## 2021-03-21 DIAGNOSIS — Z7901 Long term (current) use of anticoagulants: Secondary | ICD-10-CM | POA: Diagnosis not present

## 2021-03-22 DIAGNOSIS — Z48811 Encounter for surgical aftercare following surgery on the nervous system: Secondary | ICD-10-CM | POA: Diagnosis not present

## 2021-03-22 DIAGNOSIS — D649 Anemia, unspecified: Secondary | ICD-10-CM | POA: Diagnosis not present

## 2021-03-22 DIAGNOSIS — C50919 Malignant neoplasm of unspecified site of unspecified female breast: Secondary | ICD-10-CM | POA: Diagnosis not present

## 2021-03-22 DIAGNOSIS — E119 Type 2 diabetes mellitus without complications: Secondary | ICD-10-CM | POA: Diagnosis not present

## 2021-03-22 DIAGNOSIS — Z7901 Long term (current) use of anticoagulants: Secondary | ICD-10-CM | POA: Diagnosis not present

## 2021-03-22 DIAGNOSIS — I10 Essential (primary) hypertension: Secondary | ICD-10-CM | POA: Diagnosis not present

## 2021-03-23 ENCOUNTER — Telehealth: Payer: Self-pay | Admitting: *Deleted

## 2021-03-23 DIAGNOSIS — E119 Type 2 diabetes mellitus without complications: Secondary | ICD-10-CM | POA: Diagnosis not present

## 2021-03-23 DIAGNOSIS — D649 Anemia, unspecified: Secondary | ICD-10-CM | POA: Diagnosis not present

## 2021-03-23 DIAGNOSIS — C50919 Malignant neoplasm of unspecified site of unspecified female breast: Secondary | ICD-10-CM | POA: Diagnosis not present

## 2021-03-23 DIAGNOSIS — Z7901 Long term (current) use of anticoagulants: Secondary | ICD-10-CM | POA: Diagnosis not present

## 2021-03-23 DIAGNOSIS — Z48811 Encounter for surgical aftercare following surgery on the nervous system: Secondary | ICD-10-CM | POA: Diagnosis not present

## 2021-03-23 DIAGNOSIS — I10 Essential (primary) hypertension: Secondary | ICD-10-CM | POA: Diagnosis not present

## 2021-03-23 NOTE — Telephone Encounter (Signed)
Spoke with Summer Hawkins.  At home and doing well.  Doing physical therapy and has a sitter from am to pm three days a week.  Neighbors are helping too.   RN coming on this Friday to dc her from the home program.  Wound vac is off and the wound to the lower back is fully healed  Has no needs at this time and understands to call at anytime.

## 2021-03-25 DIAGNOSIS — Z7901 Long term (current) use of anticoagulants: Secondary | ICD-10-CM | POA: Diagnosis not present

## 2021-03-25 DIAGNOSIS — E119 Type 2 diabetes mellitus without complications: Secondary | ICD-10-CM | POA: Diagnosis not present

## 2021-03-25 DIAGNOSIS — D649 Anemia, unspecified: Secondary | ICD-10-CM | POA: Diagnosis not present

## 2021-03-25 DIAGNOSIS — I10 Essential (primary) hypertension: Secondary | ICD-10-CM | POA: Diagnosis not present

## 2021-03-25 DIAGNOSIS — C50919 Malignant neoplasm of unspecified site of unspecified female breast: Secondary | ICD-10-CM | POA: Diagnosis not present

## 2021-03-25 DIAGNOSIS — Z48811 Encounter for surgical aftercare following surgery on the nervous system: Secondary | ICD-10-CM | POA: Diagnosis not present

## 2021-03-28 ENCOUNTER — Ambulatory Visit: Payer: Self-pay | Admitting: *Deleted

## 2021-03-29 ENCOUNTER — Other Ambulatory Visit: Payer: Self-pay | Admitting: *Deleted

## 2021-03-29 DIAGNOSIS — M4316 Spondylolisthesis, lumbar region: Secondary | ICD-10-CM | POA: Diagnosis not present

## 2021-03-29 DIAGNOSIS — Z981 Arthrodesis status: Secondary | ICD-10-CM | POA: Diagnosis not present

## 2021-03-29 NOTE — Patient Outreach (Signed)
[facility]) Care Management  03/29/2021  [place] [birth year] [id number]   Outgoing call placed to member to follow up on decision for [facility] involvement.  She declines offer, benefits explained again, state she feels she has all the help she needs but will call this care manager should she change her mind.  Will close case at this time.  Valente David, RN, MSN, [place] Manager [phone]

## 2021-03-30 DIAGNOSIS — D649 Anemia, unspecified: Secondary | ICD-10-CM | POA: Diagnosis not present

## 2021-03-30 DIAGNOSIS — Z48811 Encounter for surgical aftercare following surgery on the nervous system: Secondary | ICD-10-CM | POA: Diagnosis not present

## 2021-03-30 DIAGNOSIS — Z7901 Long term (current) use of anticoagulants: Secondary | ICD-10-CM | POA: Diagnosis not present

## 2021-03-30 DIAGNOSIS — I10 Essential (primary) hypertension: Secondary | ICD-10-CM | POA: Diagnosis not present

## 2021-03-30 DIAGNOSIS — C50919 Malignant neoplasm of unspecified site of unspecified female breast: Secondary | ICD-10-CM | POA: Diagnosis not present

## 2021-03-30 DIAGNOSIS — E119 Type 2 diabetes mellitus without complications: Secondary | ICD-10-CM | POA: Diagnosis not present

## 2021-04-06 DIAGNOSIS — D649 Anemia, unspecified: Secondary | ICD-10-CM | POA: Diagnosis not present

## 2021-04-06 DIAGNOSIS — I10 Essential (primary) hypertension: Secondary | ICD-10-CM | POA: Diagnosis not present

## 2021-04-06 DIAGNOSIS — Z48811 Encounter for surgical aftercare following surgery on the nervous system: Secondary | ICD-10-CM | POA: Diagnosis not present

## 2021-04-06 DIAGNOSIS — E119 Type 2 diabetes mellitus without complications: Secondary | ICD-10-CM | POA: Diagnosis not present

## 2021-04-06 DIAGNOSIS — C50919 Malignant neoplasm of unspecified site of unspecified female breast: Secondary | ICD-10-CM | POA: Diagnosis not present

## 2021-04-06 DIAGNOSIS — Z7901 Long term (current) use of anticoagulants: Secondary | ICD-10-CM | POA: Diagnosis not present

## 2021-04-07 DIAGNOSIS — Z7984 Long term (current) use of oral hypoglycemic drugs: Secondary | ICD-10-CM | POA: Diagnosis not present

## 2021-04-07 DIAGNOSIS — H409 Unspecified glaucoma: Secondary | ICD-10-CM | POA: Diagnosis not present

## 2021-04-07 DIAGNOSIS — C50919 Malignant neoplasm of unspecified site of unspecified female breast: Secondary | ICD-10-CM | POA: Diagnosis not present

## 2021-04-07 DIAGNOSIS — E079 Disorder of thyroid, unspecified: Secondary | ICD-10-CM | POA: Diagnosis not present

## 2021-04-07 DIAGNOSIS — E669 Obesity, unspecified: Secondary | ICD-10-CM | POA: Diagnosis not present

## 2021-04-07 DIAGNOSIS — Z794 Long term (current) use of insulin: Secondary | ICD-10-CM | POA: Diagnosis not present

## 2021-04-07 DIAGNOSIS — Z7901 Long term (current) use of anticoagulants: Secondary | ICD-10-CM | POA: Diagnosis not present

## 2021-04-07 DIAGNOSIS — I10 Essential (primary) hypertension: Secondary | ICD-10-CM | POA: Diagnosis not present

## 2021-04-07 DIAGNOSIS — Z9181 History of falling: Secondary | ICD-10-CM | POA: Diagnosis not present

## 2021-04-07 DIAGNOSIS — Z48811 Encounter for surgical aftercare following surgery on the nervous system: Secondary | ICD-10-CM | POA: Diagnosis not present

## 2021-04-07 DIAGNOSIS — D649 Anemia, unspecified: Secondary | ICD-10-CM | POA: Diagnosis not present

## 2021-04-07 DIAGNOSIS — E119 Type 2 diabetes mellitus without complications: Secondary | ICD-10-CM | POA: Diagnosis not present

## 2021-04-07 DIAGNOSIS — Z950 Presence of cardiac pacemaker: Secondary | ICD-10-CM | POA: Diagnosis not present

## 2021-04-11 DIAGNOSIS — E119 Type 2 diabetes mellitus without complications: Secondary | ICD-10-CM | POA: Diagnosis not present

## 2021-04-11 DIAGNOSIS — D649 Anemia, unspecified: Secondary | ICD-10-CM | POA: Diagnosis not present

## 2021-04-11 DIAGNOSIS — I10 Essential (primary) hypertension: Secondary | ICD-10-CM | POA: Diagnosis not present

## 2021-04-11 DIAGNOSIS — C50919 Malignant neoplasm of unspecified site of unspecified female breast: Secondary | ICD-10-CM | POA: Diagnosis not present

## 2021-04-11 DIAGNOSIS — Z7901 Long term (current) use of anticoagulants: Secondary | ICD-10-CM | POA: Diagnosis not present

## 2021-04-11 DIAGNOSIS — Z48811 Encounter for surgical aftercare following surgery on the nervous system: Secondary | ICD-10-CM | POA: Diagnosis not present

## 2021-04-12 DIAGNOSIS — E538 Deficiency of other specified B group vitamins: Secondary | ICD-10-CM | POA: Diagnosis not present

## 2021-04-12 DIAGNOSIS — I509 Heart failure, unspecified: Secondary | ICD-10-CM | POA: Diagnosis not present

## 2021-04-12 DIAGNOSIS — E039 Hypothyroidism, unspecified: Secondary | ICD-10-CM | POA: Diagnosis not present

## 2021-04-12 DIAGNOSIS — E7800 Pure hypercholesterolemia, unspecified: Secondary | ICD-10-CM | POA: Diagnosis not present

## 2021-04-12 DIAGNOSIS — E1129 Type 2 diabetes mellitus with other diabetic kidney complication: Secondary | ICD-10-CM | POA: Diagnosis not present

## 2021-04-14 DIAGNOSIS — E119 Type 2 diabetes mellitus without complications: Secondary | ICD-10-CM | POA: Diagnosis not present

## 2021-04-14 DIAGNOSIS — D649 Anemia, unspecified: Secondary | ICD-10-CM | POA: Diagnosis not present

## 2021-04-14 DIAGNOSIS — Z48811 Encounter for surgical aftercare following surgery on the nervous system: Secondary | ICD-10-CM | POA: Diagnosis not present

## 2021-04-14 DIAGNOSIS — I10 Essential (primary) hypertension: Secondary | ICD-10-CM | POA: Diagnosis not present

## 2021-04-14 DIAGNOSIS — Z7901 Long term (current) use of anticoagulants: Secondary | ICD-10-CM | POA: Diagnosis not present

## 2021-04-14 DIAGNOSIS — C50919 Malignant neoplasm of unspecified site of unspecified female breast: Secondary | ICD-10-CM | POA: Diagnosis not present

## 2021-04-18 DIAGNOSIS — D649 Anemia, unspecified: Secondary | ICD-10-CM | POA: Diagnosis not present

## 2021-04-18 DIAGNOSIS — Z48811 Encounter for surgical aftercare following surgery on the nervous system: Secondary | ICD-10-CM | POA: Diagnosis not present

## 2021-04-18 DIAGNOSIS — E119 Type 2 diabetes mellitus without complications: Secondary | ICD-10-CM | POA: Diagnosis not present

## 2021-04-18 DIAGNOSIS — C50919 Malignant neoplasm of unspecified site of unspecified female breast: Secondary | ICD-10-CM | POA: Diagnosis not present

## 2021-04-18 DIAGNOSIS — I10 Essential (primary) hypertension: Secondary | ICD-10-CM | POA: Diagnosis not present

## 2021-04-18 DIAGNOSIS — Z7901 Long term (current) use of anticoagulants: Secondary | ICD-10-CM | POA: Diagnosis not present

## 2021-04-19 DIAGNOSIS — I509 Heart failure, unspecified: Secondary | ICD-10-CM | POA: Diagnosis not present

## 2021-04-19 DIAGNOSIS — I4821 Permanent atrial fibrillation: Secondary | ICD-10-CM | POA: Diagnosis not present

## 2021-04-19 DIAGNOSIS — Z1331 Encounter for screening for depression: Secondary | ICD-10-CM | POA: Diagnosis not present

## 2021-04-19 DIAGNOSIS — Z7901 Long term (current) use of anticoagulants: Secondary | ICD-10-CM | POA: Diagnosis not present

## 2021-04-19 DIAGNOSIS — N1832 Chronic kidney disease, stage 3b: Secondary | ICD-10-CM | POA: Diagnosis not present

## 2021-04-19 DIAGNOSIS — E1129 Type 2 diabetes mellitus with other diabetic kidney complication: Secondary | ICD-10-CM | POA: Diagnosis not present

## 2021-04-19 DIAGNOSIS — Z0000 Encounter for general adult medical examination without abnormal findings: Secondary | ICD-10-CM | POA: Diagnosis not present

## 2021-04-19 DIAGNOSIS — C50412 Malignant neoplasm of upper-outer quadrant of left female breast: Secondary | ICD-10-CM | POA: Diagnosis not present

## 2021-04-19 DIAGNOSIS — E875 Hyperkalemia: Secondary | ICD-10-CM | POA: Diagnosis not present

## 2021-04-19 DIAGNOSIS — Z794 Long term (current) use of insulin: Secondary | ICD-10-CM | POA: Diagnosis not present

## 2021-04-19 DIAGNOSIS — Z1339 Encounter for screening examination for other mental health and behavioral disorders: Secondary | ICD-10-CM | POA: Diagnosis not present

## 2021-04-19 DIAGNOSIS — R82998 Other abnormal findings in urine: Secondary | ICD-10-CM | POA: Diagnosis not present

## 2021-04-19 DIAGNOSIS — I130 Hypertensive heart and chronic kidney disease with heart failure and stage 1 through stage 4 chronic kidney disease, or unspecified chronic kidney disease: Secondary | ICD-10-CM | POA: Diagnosis not present

## 2021-04-20 DIAGNOSIS — C50919 Malignant neoplasm of unspecified site of unspecified female breast: Secondary | ICD-10-CM | POA: Diagnosis not present

## 2021-04-20 DIAGNOSIS — Z48811 Encounter for surgical aftercare following surgery on the nervous system: Secondary | ICD-10-CM | POA: Diagnosis not present

## 2021-04-20 DIAGNOSIS — Z7901 Long term (current) use of anticoagulants: Secondary | ICD-10-CM | POA: Diagnosis not present

## 2021-04-20 DIAGNOSIS — E119 Type 2 diabetes mellitus without complications: Secondary | ICD-10-CM | POA: Diagnosis not present

## 2021-04-20 DIAGNOSIS — D649 Anemia, unspecified: Secondary | ICD-10-CM | POA: Diagnosis not present

## 2021-04-20 DIAGNOSIS — I10 Essential (primary) hypertension: Secondary | ICD-10-CM | POA: Diagnosis not present

## 2021-04-25 DIAGNOSIS — C50919 Malignant neoplasm of unspecified site of unspecified female breast: Secondary | ICD-10-CM | POA: Diagnosis not present

## 2021-04-25 DIAGNOSIS — Z7901 Long term (current) use of anticoagulants: Secondary | ICD-10-CM | POA: Diagnosis not present

## 2021-04-25 DIAGNOSIS — Z48811 Encounter for surgical aftercare following surgery on the nervous system: Secondary | ICD-10-CM | POA: Diagnosis not present

## 2021-04-25 DIAGNOSIS — D649 Anemia, unspecified: Secondary | ICD-10-CM | POA: Diagnosis not present

## 2021-04-25 DIAGNOSIS — E119 Type 2 diabetes mellitus without complications: Secondary | ICD-10-CM | POA: Diagnosis not present

## 2021-04-25 DIAGNOSIS — I10 Essential (primary) hypertension: Secondary | ICD-10-CM | POA: Diagnosis not present

## 2021-04-27 DIAGNOSIS — Z7901 Long term (current) use of anticoagulants: Secondary | ICD-10-CM | POA: Diagnosis not present

## 2021-04-27 DIAGNOSIS — C50919 Malignant neoplasm of unspecified site of unspecified female breast: Secondary | ICD-10-CM | POA: Diagnosis not present

## 2021-04-27 DIAGNOSIS — I10 Essential (primary) hypertension: Secondary | ICD-10-CM | POA: Diagnosis not present

## 2021-04-27 DIAGNOSIS — D649 Anemia, unspecified: Secondary | ICD-10-CM | POA: Diagnosis not present

## 2021-04-27 DIAGNOSIS — Z48811 Encounter for surgical aftercare following surgery on the nervous system: Secondary | ICD-10-CM | POA: Diagnosis not present

## 2021-04-27 DIAGNOSIS — E119 Type 2 diabetes mellitus without complications: Secondary | ICD-10-CM | POA: Diagnosis not present

## 2021-05-02 DIAGNOSIS — D649 Anemia, unspecified: Secondary | ICD-10-CM | POA: Diagnosis not present

## 2021-05-02 DIAGNOSIS — I10 Essential (primary) hypertension: Secondary | ICD-10-CM | POA: Diagnosis not present

## 2021-05-02 DIAGNOSIS — Z7901 Long term (current) use of anticoagulants: Secondary | ICD-10-CM | POA: Diagnosis not present

## 2021-05-02 DIAGNOSIS — E119 Type 2 diabetes mellitus without complications: Secondary | ICD-10-CM | POA: Diagnosis not present

## 2021-05-02 DIAGNOSIS — C50919 Malignant neoplasm of unspecified site of unspecified female breast: Secondary | ICD-10-CM | POA: Diagnosis not present

## 2021-05-02 DIAGNOSIS — Z48811 Encounter for surgical aftercare following surgery on the nervous system: Secondary | ICD-10-CM | POA: Diagnosis not present

## 2021-05-04 DIAGNOSIS — E119 Type 2 diabetes mellitus without complications: Secondary | ICD-10-CM | POA: Diagnosis not present

## 2021-05-04 DIAGNOSIS — C50919 Malignant neoplasm of unspecified site of unspecified female breast: Secondary | ICD-10-CM | POA: Diagnosis not present

## 2021-05-04 DIAGNOSIS — Z7901 Long term (current) use of anticoagulants: Secondary | ICD-10-CM | POA: Diagnosis not present

## 2021-05-04 DIAGNOSIS — Z48811 Encounter for surgical aftercare following surgery on the nervous system: Secondary | ICD-10-CM | POA: Diagnosis not present

## 2021-05-04 DIAGNOSIS — D649 Anemia, unspecified: Secondary | ICD-10-CM | POA: Diagnosis not present

## 2021-05-04 DIAGNOSIS — I10 Essential (primary) hypertension: Secondary | ICD-10-CM | POA: Diagnosis not present

## 2021-05-07 DIAGNOSIS — Z48811 Encounter for surgical aftercare following surgery on the nervous system: Secondary | ICD-10-CM | POA: Diagnosis not present

## 2021-05-07 DIAGNOSIS — E119 Type 2 diabetes mellitus without complications: Secondary | ICD-10-CM | POA: Diagnosis not present

## 2021-05-07 DIAGNOSIS — I10 Essential (primary) hypertension: Secondary | ICD-10-CM | POA: Diagnosis not present

## 2021-05-07 DIAGNOSIS — Z9181 History of falling: Secondary | ICD-10-CM | POA: Diagnosis not present

## 2021-05-07 DIAGNOSIS — E669 Obesity, unspecified: Secondary | ICD-10-CM | POA: Diagnosis not present

## 2021-05-07 DIAGNOSIS — H409 Unspecified glaucoma: Secondary | ICD-10-CM | POA: Diagnosis not present

## 2021-05-07 DIAGNOSIS — C50919 Malignant neoplasm of unspecified site of unspecified female breast: Secondary | ICD-10-CM | POA: Diagnosis not present

## 2021-05-07 DIAGNOSIS — Z794 Long term (current) use of insulin: Secondary | ICD-10-CM | POA: Diagnosis not present

## 2021-05-07 DIAGNOSIS — E079 Disorder of thyroid, unspecified: Secondary | ICD-10-CM | POA: Diagnosis not present

## 2021-05-07 DIAGNOSIS — Z7901 Long term (current) use of anticoagulants: Secondary | ICD-10-CM | POA: Diagnosis not present

## 2021-05-07 DIAGNOSIS — Z950 Presence of cardiac pacemaker: Secondary | ICD-10-CM | POA: Diagnosis not present

## 2021-05-07 DIAGNOSIS — D649 Anemia, unspecified: Secondary | ICD-10-CM | POA: Diagnosis not present

## 2021-05-07 DIAGNOSIS — Z7984 Long term (current) use of oral hypoglycemic drugs: Secondary | ICD-10-CM | POA: Diagnosis not present

## 2021-05-09 DIAGNOSIS — Z48811 Encounter for surgical aftercare following surgery on the nervous system: Secondary | ICD-10-CM | POA: Diagnosis not present

## 2021-05-09 DIAGNOSIS — C50919 Malignant neoplasm of unspecified site of unspecified female breast: Secondary | ICD-10-CM | POA: Diagnosis not present

## 2021-05-09 DIAGNOSIS — I10 Essential (primary) hypertension: Secondary | ICD-10-CM | POA: Diagnosis not present

## 2021-05-09 DIAGNOSIS — E119 Type 2 diabetes mellitus without complications: Secondary | ICD-10-CM | POA: Diagnosis not present

## 2021-05-09 DIAGNOSIS — D649 Anemia, unspecified: Secondary | ICD-10-CM | POA: Diagnosis not present

## 2021-05-09 DIAGNOSIS — Z7984 Long term (current) use of oral hypoglycemic drugs: Secondary | ICD-10-CM | POA: Diagnosis not present

## 2021-05-12 DIAGNOSIS — C50919 Malignant neoplasm of unspecified site of unspecified female breast: Secondary | ICD-10-CM | POA: Diagnosis not present

## 2021-05-12 DIAGNOSIS — Z7984 Long term (current) use of oral hypoglycemic drugs: Secondary | ICD-10-CM | POA: Diagnosis not present

## 2021-05-12 DIAGNOSIS — D649 Anemia, unspecified: Secondary | ICD-10-CM | POA: Diagnosis not present

## 2021-05-12 DIAGNOSIS — I10 Essential (primary) hypertension: Secondary | ICD-10-CM | POA: Diagnosis not present

## 2021-05-12 DIAGNOSIS — Z48811 Encounter for surgical aftercare following surgery on the nervous system: Secondary | ICD-10-CM | POA: Diagnosis not present

## 2021-05-12 DIAGNOSIS — E119 Type 2 diabetes mellitus without complications: Secondary | ICD-10-CM | POA: Diagnosis not present

## 2021-05-18 DIAGNOSIS — I10 Essential (primary) hypertension: Secondary | ICD-10-CM | POA: Diagnosis not present

## 2021-05-18 DIAGNOSIS — E119 Type 2 diabetes mellitus without complications: Secondary | ICD-10-CM | POA: Diagnosis not present

## 2021-05-18 DIAGNOSIS — Z7984 Long term (current) use of oral hypoglycemic drugs: Secondary | ICD-10-CM | POA: Diagnosis not present

## 2021-05-18 DIAGNOSIS — D649 Anemia, unspecified: Secondary | ICD-10-CM | POA: Diagnosis not present

## 2021-05-18 DIAGNOSIS — C50919 Malignant neoplasm of unspecified site of unspecified female breast: Secondary | ICD-10-CM | POA: Diagnosis not present

## 2021-05-18 DIAGNOSIS — Z48811 Encounter for surgical aftercare following surgery on the nervous system: Secondary | ICD-10-CM | POA: Diagnosis not present

## 2021-05-20 DIAGNOSIS — C50919 Malignant neoplasm of unspecified site of unspecified female breast: Secondary | ICD-10-CM | POA: Diagnosis not present

## 2021-05-20 DIAGNOSIS — D649 Anemia, unspecified: Secondary | ICD-10-CM | POA: Diagnosis not present

## 2021-05-20 DIAGNOSIS — E119 Type 2 diabetes mellitus without complications: Secondary | ICD-10-CM | POA: Diagnosis not present

## 2021-05-20 DIAGNOSIS — Z7984 Long term (current) use of oral hypoglycemic drugs: Secondary | ICD-10-CM | POA: Diagnosis not present

## 2021-05-20 DIAGNOSIS — I10 Essential (primary) hypertension: Secondary | ICD-10-CM | POA: Diagnosis not present

## 2021-05-20 DIAGNOSIS — Z48811 Encounter for surgical aftercare following surgery on the nervous system: Secondary | ICD-10-CM | POA: Diagnosis not present

## 2021-05-23 DIAGNOSIS — Z48811 Encounter for surgical aftercare following surgery on the nervous system: Secondary | ICD-10-CM | POA: Diagnosis not present

## 2021-05-23 DIAGNOSIS — I10 Essential (primary) hypertension: Secondary | ICD-10-CM | POA: Diagnosis not present

## 2021-05-23 DIAGNOSIS — Z7984 Long term (current) use of oral hypoglycemic drugs: Secondary | ICD-10-CM | POA: Diagnosis not present

## 2021-05-23 DIAGNOSIS — E119 Type 2 diabetes mellitus without complications: Secondary | ICD-10-CM | POA: Diagnosis not present

## 2021-05-23 DIAGNOSIS — C50919 Malignant neoplasm of unspecified site of unspecified female breast: Secondary | ICD-10-CM | POA: Diagnosis not present

## 2021-05-23 DIAGNOSIS — D649 Anemia, unspecified: Secondary | ICD-10-CM | POA: Diagnosis not present

## 2021-05-25 ENCOUNTER — Other Ambulatory Visit: Payer: Self-pay | Admitting: Internal Medicine

## 2021-05-25 DIAGNOSIS — E119 Type 2 diabetes mellitus without complications: Secondary | ICD-10-CM | POA: Diagnosis not present

## 2021-05-25 DIAGNOSIS — C50919 Malignant neoplasm of unspecified site of unspecified female breast: Secondary | ICD-10-CM | POA: Diagnosis not present

## 2021-05-25 DIAGNOSIS — D649 Anemia, unspecified: Secondary | ICD-10-CM | POA: Diagnosis not present

## 2021-05-25 DIAGNOSIS — Z1231 Encounter for screening mammogram for malignant neoplasm of breast: Secondary | ICD-10-CM

## 2021-05-25 DIAGNOSIS — I10 Essential (primary) hypertension: Secondary | ICD-10-CM | POA: Diagnosis not present

## 2021-05-25 DIAGNOSIS — Z7984 Long term (current) use of oral hypoglycemic drugs: Secondary | ICD-10-CM | POA: Diagnosis not present

## 2021-05-25 DIAGNOSIS — Z48811 Encounter for surgical aftercare following surgery on the nervous system: Secondary | ICD-10-CM | POA: Diagnosis not present

## 2021-05-30 ENCOUNTER — Ambulatory Visit (INDEPENDENT_AMBULATORY_CARE_PROVIDER_SITE_OTHER): Payer: Medicare Other

## 2021-05-30 DIAGNOSIS — I4821 Permanent atrial fibrillation: Secondary | ICD-10-CM | POA: Diagnosis not present

## 2021-05-30 LAB — CUP PACEART REMOTE DEVICE CHECK
Battery Remaining Longevity: 111 mo
Battery Voltage: 3 V
Brady Statistic AP VP Percent: 0.16 %
Brady Statistic AP VS Percent: 88.17 %
Brady Statistic AS VP Percent: 0 %
Brady Statistic AS VS Percent: 11.67 %
Brady Statistic RA Percent Paced: 88.2 %
Brady Statistic RV Percent Paced: 0.16 %
Date Time Interrogation Session: 20230320085436
Implantable Lead Implant Date: 20200921
Implantable Lead Implant Date: 20200921
Implantable Lead Location: 753858
Implantable Lead Location: 753860
Implantable Lead Model: 3830
Implantable Lead Model: 5076
Implantable Pulse Generator Implant Date: 20200921
Lead Channel Impedance Value: 304 Ohm
Lead Channel Impedance Value: 361 Ohm
Lead Channel Impedance Value: 361 Ohm
Lead Channel Impedance Value: 475 Ohm
Lead Channel Sensing Intrinsic Amplitude: 11.5 mV
Lead Channel Sensing Intrinsic Amplitude: 11.5 mV
Lead Channel Sensing Intrinsic Amplitude: 6.375 mV
Lead Channel Sensing Intrinsic Amplitude: 6.375 mV
Lead Channel Setting Pacing Amplitude: 2.5 V
Lead Channel Setting Pacing Amplitude: 2.5 V
Lead Channel Setting Pacing Pulse Width: 0.4 ms
Lead Channel Setting Sensing Sensitivity: 1.2 mV

## 2021-06-02 DIAGNOSIS — E119 Type 2 diabetes mellitus without complications: Secondary | ICD-10-CM | POA: Diagnosis not present

## 2021-06-02 DIAGNOSIS — C50919 Malignant neoplasm of unspecified site of unspecified female breast: Secondary | ICD-10-CM | POA: Diagnosis not present

## 2021-06-02 DIAGNOSIS — Z48811 Encounter for surgical aftercare following surgery on the nervous system: Secondary | ICD-10-CM | POA: Diagnosis not present

## 2021-06-02 DIAGNOSIS — I10 Essential (primary) hypertension: Secondary | ICD-10-CM | POA: Diagnosis not present

## 2021-06-02 DIAGNOSIS — D649 Anemia, unspecified: Secondary | ICD-10-CM | POA: Diagnosis not present

## 2021-06-02 DIAGNOSIS — Z7984 Long term (current) use of oral hypoglycemic drugs: Secondary | ICD-10-CM | POA: Diagnosis not present

## 2021-06-06 DIAGNOSIS — H5340 Unspecified visual field defects: Secondary | ICD-10-CM | POA: Diagnosis not present

## 2021-06-08 NOTE — Progress Notes (Signed)
Remote pacemaker transmission.   

## 2021-06-10 DIAGNOSIS — E1129 Type 2 diabetes mellitus with other diabetic kidney complication: Secondary | ICD-10-CM | POA: Diagnosis not present

## 2021-06-10 DIAGNOSIS — I130 Hypertensive heart and chronic kidney disease with heart failure and stage 1 through stage 4 chronic kidney disease, or unspecified chronic kidney disease: Secondary | ICD-10-CM | POA: Diagnosis not present

## 2021-06-10 DIAGNOSIS — N1832 Chronic kidney disease, stage 3b: Secondary | ICD-10-CM | POA: Diagnosis not present

## 2021-06-10 DIAGNOSIS — E7800 Pure hypercholesterolemia, unspecified: Secondary | ICD-10-CM | POA: Diagnosis not present

## 2021-06-14 ENCOUNTER — Ambulatory Visit
Admission: RE | Admit: 2021-06-14 | Discharge: 2021-06-14 | Disposition: A | Discharge status: Home / Self Care | Payer: Medicare Other | Source: Ambulatory Visit | Attending: Internal Medicine | Admitting: Internal Medicine

## 2021-06-14 ENCOUNTER — Telehealth: Payer: Self-pay | Admitting: *Deleted

## 2021-06-14 DIAGNOSIS — Z1231 Encounter for screening mammogram for malignant neoplasm of breast: Secondary | ICD-10-CM | POA: Diagnosis not present

## 2021-06-14 NOTE — Telephone Encounter (Signed)
Summer Hawkins stating that she is now at home alone and doing well.  Is able to get around with the help of a walker.  Does have someone coming in to check on her and to do the housework and cooking.  No problems just update. ?

## 2021-06-22 ENCOUNTER — Telehealth: Payer: Self-pay | Admitting: *Deleted

## 2021-06-22 NOTE — Telephone Encounter (Signed)
051223/tct-Myya/doing well.  Starting to drive today. Short distances.  Having some trouble with her left leg.  Doing exercises  for this.  Will go and take her to the church on [phone] for fellowship.  No other complaints or problems present. ?

## 2021-06-28 DIAGNOSIS — M4316 Spondylolisthesis, lumbar region: Secondary | ICD-10-CM | POA: Diagnosis not present

## 2021-06-28 DIAGNOSIS — Z6833 Body mass index (BMI) 33.0-33.9, adult: Secondary | ICD-10-CM | POA: Diagnosis not present

## 2021-07-10 DIAGNOSIS — N1832 Chronic kidney disease, stage 3b: Secondary | ICD-10-CM | POA: Diagnosis not present

## 2021-07-10 DIAGNOSIS — E1129 Type 2 diabetes mellitus with other diabetic kidney complication: Secondary | ICD-10-CM | POA: Diagnosis not present

## 2021-07-10 DIAGNOSIS — I130 Hypertensive heart and chronic kidney disease with heart failure and stage 1 through stage 4 chronic kidney disease, or unspecified chronic kidney disease: Secondary | ICD-10-CM | POA: Diagnosis not present

## 2021-07-11 ENCOUNTER — Telehealth: Payer: Self-pay | Admitting: Cardiovascular Disease

## 2021-07-11 NOTE — Telephone Encounter (Signed)
Message from Dr. Angelena Form: ?Agree that her dose should now be 2.5 mg po BID given her age and renal function. Gerald Stabs ? ?Called and left detailed message for Irine Seal at [facility] of the above information.  Adv to call if any other questions.  ? ?Our medication list shows pt has been on 2.5 mg but med rec shows a prescription to [facility] 07/08/21 for 5 mg BID.   ?

## 2021-07-11 NOTE — Telephone Encounter (Signed)
Pt c/o medication issue: ? ?1. Name of Medication: apixaban (ELIQUIS) 2.5 MG TABS tablet ? ?2. How are you currently taking this medication (dosage and times per day)? Has been taking 5 mg twice a day ? ?3. Are you having a reaction (difficulty breathing--STAT)? no ? ?4. What is your medication issue? Summer Hawkins, clinical pharmacist with [facility], states the patient's creatine levels have been above 1.5 and due to her age she should be taking 2.5 mg twice a day. She states the patient is currently taking 5 mg twice a day. She says her BMET on 04/19/2021 showed creatine was 1.8, on 04/12/21 her creatine was 1.7, and on 10/06/2020 creatine was 1.9. Phone: [phone] ? ? ?

## 2021-07-26 ENCOUNTER — Telehealth: Payer: Self-pay | Admitting: *Deleted

## 2021-07-26 NOTE — Telephone Encounter (Signed)
[email or web address] on the death of her sister to offer support and prayers.  Will send a card to the home. ?

## 2021-08-10 DIAGNOSIS — I5032 Chronic diastolic (congestive) heart failure: Secondary | ICD-10-CM | POA: Diagnosis not present

## 2021-08-10 DIAGNOSIS — N1832 Chronic kidney disease, stage 3b: Secondary | ICD-10-CM | POA: Diagnosis not present

## 2021-08-10 DIAGNOSIS — I130 Hypertensive heart and chronic kidney disease with heart failure and stage 1 through stage 4 chronic kidney disease, or unspecified chronic kidney disease: Secondary | ICD-10-CM | POA: Diagnosis not present

## 2021-08-11 DIAGNOSIS — H5340 Unspecified visual field defects: Secondary | ICD-10-CM | POA: Diagnosis not present

## 2021-08-11 DIAGNOSIS — H401134 Primary open-angle glaucoma, bilateral, indeterminate stage: Secondary | ICD-10-CM | POA: Diagnosis not present

## 2021-08-29 ENCOUNTER — Ambulatory Visit (INDEPENDENT_AMBULATORY_CARE_PROVIDER_SITE_OTHER): Payer: Medicare Other

## 2021-08-29 DIAGNOSIS — I5032 Chronic diastolic (congestive) heart failure: Secondary | ICD-10-CM | POA: Diagnosis not present

## 2021-08-30 LAB — CUP PACEART REMOTE DEVICE CHECK
Battery Remaining Longevity: 109 mo
Battery Voltage: 3 V
Brady Statistic AP VP Percent: 0.04 %
Brady Statistic AP VS Percent: 99.21 %
Brady Statistic AS VP Percent: 0 %
Brady Statistic AS VS Percent: 0.76 %
Brady Statistic RA Percent Paced: 99.77 %
Brady Statistic RV Percent Paced: 0.04 %
Date Time Interrogation Session: 20230618195846
Implantable Lead Implant Date: 20200921
Implantable Lead Implant Date: 20200921
Implantable Lead Location: 753858
Implantable Lead Location: 753860
Implantable Lead Model: 3830
Implantable Lead Model: 5076
Implantable Pulse Generator Implant Date: 20200921
Lead Channel Impedance Value: 323 Ohm
Lead Channel Impedance Value: 380 Ohm
Lead Channel Impedance Value: 380 Ohm
Lead Channel Impedance Value: 494 Ohm
Lead Channel Sensing Intrinsic Amplitude: 11.5 mV
Lead Channel Sensing Intrinsic Amplitude: 11.5 mV
Lead Channel Sensing Intrinsic Amplitude: 31.625 mV
Lead Channel Sensing Intrinsic Amplitude: 31.625 mV
Lead Channel Setting Pacing Amplitude: 2.5 V
Lead Channel Setting Pacing Amplitude: 2.5 V
Lead Channel Setting Pacing Pulse Width: 0.4 ms
Lead Channel Setting Sensing Sensitivity: 1.2 mV

## 2021-09-09 DIAGNOSIS — E1129 Type 2 diabetes mellitus with other diabetic kidney complication: Secondary | ICD-10-CM | POA: Diagnosis not present

## 2021-09-09 DIAGNOSIS — I4821 Permanent atrial fibrillation: Secondary | ICD-10-CM | POA: Diagnosis not present

## 2021-09-09 DIAGNOSIS — I130 Hypertensive heart and chronic kidney disease with heart failure and stage 1 through stage 4 chronic kidney disease, or unspecified chronic kidney disease: Secondary | ICD-10-CM | POA: Diagnosis not present

## 2021-09-09 DIAGNOSIS — I5032 Chronic diastolic (congestive) heart failure: Secondary | ICD-10-CM | POA: Diagnosis not present

## 2021-09-19 NOTE — Progress Notes (Signed)
Remote pacemaker transmission.   

## 2021-10-10 DIAGNOSIS — I4821 Permanent atrial fibrillation: Secondary | ICD-10-CM | POA: Diagnosis not present

## 2021-10-10 DIAGNOSIS — E1129 Type 2 diabetes mellitus with other diabetic kidney complication: Secondary | ICD-10-CM | POA: Diagnosis not present

## 2021-10-10 DIAGNOSIS — I5032 Chronic diastolic (congestive) heart failure: Secondary | ICD-10-CM | POA: Diagnosis not present

## 2021-10-10 DIAGNOSIS — I130 Hypertensive heart and chronic kidney disease with heart failure and stage 1 through stage 4 chronic kidney disease, or unspecified chronic kidney disease: Secondary | ICD-10-CM | POA: Diagnosis not present

## 2021-10-11 DIAGNOSIS — E538 Deficiency of other specified B group vitamins: Secondary | ICD-10-CM | POA: Diagnosis not present

## 2021-10-11 DIAGNOSIS — Z6833 Body mass index (BMI) 33.0-33.9, adult: Secondary | ICD-10-CM | POA: Diagnosis not present

## 2021-10-11 DIAGNOSIS — E1129 Type 2 diabetes mellitus with other diabetic kidney complication: Secondary | ICD-10-CM | POA: Diagnosis not present

## 2021-10-11 DIAGNOSIS — Z853 Personal history of malignant neoplasm of breast: Secondary | ICD-10-CM | POA: Diagnosis not present

## 2021-10-11 DIAGNOSIS — E113591 Type 2 diabetes mellitus with proliferative diabetic retinopathy without macular edema, right eye: Secondary | ICD-10-CM | POA: Diagnosis not present

## 2021-10-11 DIAGNOSIS — N3281 Overactive bladder: Secondary | ICD-10-CM | POA: Diagnosis not present

## 2021-10-11 DIAGNOSIS — G5602 Carpal tunnel syndrome, left upper limb: Secondary | ICD-10-CM | POA: Diagnosis not present

## 2021-10-11 DIAGNOSIS — H3500 Unspecified background retinopathy: Secondary | ICD-10-CM | POA: Diagnosis not present

## 2021-10-11 DIAGNOSIS — I130 Hypertensive heart and chronic kidney disease with heart failure and stage 1 through stage 4 chronic kidney disease, or unspecified chronic kidney disease: Secondary | ICD-10-CM | POA: Diagnosis not present

## 2021-10-11 DIAGNOSIS — E1122 Type 2 diabetes mellitus with diabetic chronic kidney disease: Secondary | ICD-10-CM | POA: Diagnosis not present

## 2021-10-11 DIAGNOSIS — Z794 Long term (current) use of insulin: Secondary | ICD-10-CM | POA: Diagnosis not present

## 2021-10-11 DIAGNOSIS — E039 Hypothyroidism, unspecified: Secondary | ICD-10-CM | POA: Diagnosis not present

## 2021-10-11 DIAGNOSIS — N1832 Chronic kidney disease, stage 3b: Secondary | ICD-10-CM | POA: Diagnosis not present

## 2021-10-11 DIAGNOSIS — I4821 Permanent atrial fibrillation: Secondary | ICD-10-CM | POA: Diagnosis not present

## 2021-10-11 DIAGNOSIS — Z7984 Long term (current) use of oral hypoglycemic drugs: Secondary | ICD-10-CM | POA: Diagnosis not present

## 2021-10-11 DIAGNOSIS — M545 Low back pain, unspecified: Secondary | ICD-10-CM | POA: Diagnosis not present

## 2021-10-11 DIAGNOSIS — Z950 Presence of cardiac pacemaker: Secondary | ICD-10-CM | POA: Diagnosis not present

## 2021-10-11 DIAGNOSIS — Z48811 Encounter for surgical aftercare following surgery on the nervous system: Secondary | ICD-10-CM | POA: Diagnosis not present

## 2021-10-11 DIAGNOSIS — Z7901 Long term (current) use of anticoagulants: Secondary | ICD-10-CM | POA: Diagnosis not present

## 2021-10-11 DIAGNOSIS — I509 Heart failure, unspecified: Secondary | ICD-10-CM | POA: Diagnosis not present

## 2021-10-11 DIAGNOSIS — E7800 Pure hypercholesterolemia, unspecified: Secondary | ICD-10-CM | POA: Diagnosis not present

## 2021-10-13 DIAGNOSIS — M545 Low back pain, unspecified: Secondary | ICD-10-CM | POA: Diagnosis not present

## 2021-10-13 DIAGNOSIS — E1122 Type 2 diabetes mellitus with diabetic chronic kidney disease: Secondary | ICD-10-CM | POA: Diagnosis not present

## 2021-10-13 DIAGNOSIS — N1832 Chronic kidney disease, stage 3b: Secondary | ICD-10-CM | POA: Diagnosis not present

## 2021-10-13 DIAGNOSIS — I509 Heart failure, unspecified: Secondary | ICD-10-CM | POA: Diagnosis not present

## 2021-10-13 DIAGNOSIS — Z48811 Encounter for surgical aftercare following surgery on the nervous system: Secondary | ICD-10-CM | POA: Diagnosis not present

## 2021-10-13 DIAGNOSIS — I130 Hypertensive heart and chronic kidney disease with heart failure and stage 1 through stage 4 chronic kidney disease, or unspecified chronic kidney disease: Secondary | ICD-10-CM | POA: Diagnosis not present

## 2021-10-17 DIAGNOSIS — I509 Heart failure, unspecified: Secondary | ICD-10-CM | POA: Diagnosis not present

## 2021-10-17 DIAGNOSIS — E1122 Type 2 diabetes mellitus with diabetic chronic kidney disease: Secondary | ICD-10-CM | POA: Diagnosis not present

## 2021-10-17 DIAGNOSIS — M545 Low back pain, unspecified: Secondary | ICD-10-CM | POA: Diagnosis not present

## 2021-10-17 DIAGNOSIS — N1832 Chronic kidney disease, stage 3b: Secondary | ICD-10-CM | POA: Diagnosis not present

## 2021-10-17 DIAGNOSIS — Z48811 Encounter for surgical aftercare following surgery on the nervous system: Secondary | ICD-10-CM | POA: Diagnosis not present

## 2021-10-17 DIAGNOSIS — I130 Hypertensive heart and chronic kidney disease with heart failure and stage 1 through stage 4 chronic kidney disease, or unspecified chronic kidney disease: Secondary | ICD-10-CM | POA: Diagnosis not present

## 2021-10-20 DIAGNOSIS — Z48811 Encounter for surgical aftercare following surgery on the nervous system: Secondary | ICD-10-CM | POA: Diagnosis not present

## 2021-10-20 DIAGNOSIS — I130 Hypertensive heart and chronic kidney disease with heart failure and stage 1 through stage 4 chronic kidney disease, or unspecified chronic kidney disease: Secondary | ICD-10-CM | POA: Diagnosis not present

## 2021-10-20 DIAGNOSIS — M545 Low back pain, unspecified: Secondary | ICD-10-CM | POA: Diagnosis not present

## 2021-10-20 DIAGNOSIS — E1122 Type 2 diabetes mellitus with diabetic chronic kidney disease: Secondary | ICD-10-CM | POA: Diagnosis not present

## 2021-10-20 DIAGNOSIS — I509 Heart failure, unspecified: Secondary | ICD-10-CM | POA: Diagnosis not present

## 2021-10-20 DIAGNOSIS — N1832 Chronic kidney disease, stage 3b: Secondary | ICD-10-CM | POA: Diagnosis not present

## 2021-10-21 DIAGNOSIS — M545 Low back pain, unspecified: Secondary | ICD-10-CM | POA: Diagnosis not present

## 2021-10-21 DIAGNOSIS — E7800 Pure hypercholesterolemia, unspecified: Secondary | ICD-10-CM | POA: Diagnosis not present

## 2021-10-21 DIAGNOSIS — Z7901 Long term (current) use of anticoagulants: Secondary | ICD-10-CM | POA: Diagnosis not present

## 2021-10-21 DIAGNOSIS — I4821 Permanent atrial fibrillation: Secondary | ICD-10-CM | POA: Diagnosis not present

## 2021-10-21 DIAGNOSIS — E1129 Type 2 diabetes mellitus with other diabetic kidney complication: Secondary | ICD-10-CM | POA: Diagnosis not present

## 2021-10-21 DIAGNOSIS — E039 Hypothyroidism, unspecified: Secondary | ICD-10-CM | POA: Diagnosis not present

## 2021-10-21 DIAGNOSIS — N1832 Chronic kidney disease, stage 3b: Secondary | ICD-10-CM | POA: Diagnosis not present

## 2021-10-21 DIAGNOSIS — I130 Hypertensive heart and chronic kidney disease with heart failure and stage 1 through stage 4 chronic kidney disease, or unspecified chronic kidney disease: Secondary | ICD-10-CM | POA: Diagnosis not present

## 2021-10-21 DIAGNOSIS — I509 Heart failure, unspecified: Secondary | ICD-10-CM | POA: Diagnosis not present

## 2021-10-21 DIAGNOSIS — E538 Deficiency of other specified B group vitamins: Secondary | ICD-10-CM | POA: Diagnosis not present

## 2021-10-21 DIAGNOSIS — Z794 Long term (current) use of insulin: Secondary | ICD-10-CM | POA: Diagnosis not present

## 2021-10-24 DIAGNOSIS — N1832 Chronic kidney disease, stage 3b: Secondary | ICD-10-CM | POA: Diagnosis not present

## 2021-10-24 DIAGNOSIS — Z48811 Encounter for surgical aftercare following surgery on the nervous system: Secondary | ICD-10-CM | POA: Diagnosis not present

## 2021-10-24 DIAGNOSIS — I130 Hypertensive heart and chronic kidney disease with heart failure and stage 1 through stage 4 chronic kidney disease, or unspecified chronic kidney disease: Secondary | ICD-10-CM | POA: Diagnosis not present

## 2021-10-24 DIAGNOSIS — E1122 Type 2 diabetes mellitus with diabetic chronic kidney disease: Secondary | ICD-10-CM | POA: Diagnosis not present

## 2021-10-24 DIAGNOSIS — I509 Heart failure, unspecified: Secondary | ICD-10-CM | POA: Diagnosis not present

## 2021-10-24 DIAGNOSIS — M545 Low back pain, unspecified: Secondary | ICD-10-CM | POA: Diagnosis not present

## 2021-10-26 DIAGNOSIS — I130 Hypertensive heart and chronic kidney disease with heart failure and stage 1 through stage 4 chronic kidney disease, or unspecified chronic kidney disease: Secondary | ICD-10-CM | POA: Diagnosis not present

## 2021-10-26 DIAGNOSIS — E1122 Type 2 diabetes mellitus with diabetic chronic kidney disease: Secondary | ICD-10-CM | POA: Diagnosis not present

## 2021-10-26 DIAGNOSIS — Z48811 Encounter for surgical aftercare following surgery on the nervous system: Secondary | ICD-10-CM | POA: Diagnosis not present

## 2021-10-26 DIAGNOSIS — M545 Low back pain, unspecified: Secondary | ICD-10-CM | POA: Diagnosis not present

## 2021-10-26 DIAGNOSIS — N1832 Chronic kidney disease, stage 3b: Secondary | ICD-10-CM | POA: Diagnosis not present

## 2021-10-26 DIAGNOSIS — I509 Heart failure, unspecified: Secondary | ICD-10-CM | POA: Diagnosis not present

## 2021-10-31 ENCOUNTER — Other Ambulatory Visit: Payer: Self-pay | Admitting: Cardiovascular Disease

## 2021-11-01 ENCOUNTER — Other Ambulatory Visit: Payer: Self-pay

## 2021-11-01 DIAGNOSIS — M1610 Unilateral primary osteoarthritis, unspecified hip: Secondary | ICD-10-CM | POA: Insufficient documentation

## 2021-11-01 DIAGNOSIS — I509 Heart failure, unspecified: Secondary | ICD-10-CM | POA: Diagnosis not present

## 2021-11-01 DIAGNOSIS — N1832 Chronic kidney disease, stage 3b: Secondary | ICD-10-CM | POA: Diagnosis not present

## 2021-11-01 DIAGNOSIS — Z48811 Encounter for surgical aftercare following surgery on the nervous system: Secondary | ICD-10-CM | POA: Diagnosis not present

## 2021-11-01 DIAGNOSIS — Z981 Arthrodesis status: Secondary | ICD-10-CM | POA: Insufficient documentation

## 2021-11-01 DIAGNOSIS — I130 Hypertensive heart and chronic kidney disease with heart failure and stage 1 through stage 4 chronic kidney disease, or unspecified chronic kidney disease: Secondary | ICD-10-CM | POA: Diagnosis not present

## 2021-11-01 DIAGNOSIS — M25559 Pain in unspecified hip: Secondary | ICD-10-CM | POA: Insufficient documentation

## 2021-11-01 DIAGNOSIS — E1122 Type 2 diabetes mellitus with diabetic chronic kidney disease: Secondary | ICD-10-CM | POA: Diagnosis not present

## 2021-11-01 DIAGNOSIS — M545 Low back pain, unspecified: Secondary | ICD-10-CM | POA: Diagnosis not present

## 2021-11-07 DIAGNOSIS — Z48811 Encounter for surgical aftercare following surgery on the nervous system: Secondary | ICD-10-CM | POA: Diagnosis not present

## 2021-11-07 DIAGNOSIS — N1832 Chronic kidney disease, stage 3b: Secondary | ICD-10-CM | POA: Diagnosis not present

## 2021-11-07 DIAGNOSIS — M545 Low back pain, unspecified: Secondary | ICD-10-CM | POA: Diagnosis not present

## 2021-11-07 DIAGNOSIS — I509 Heart failure, unspecified: Secondary | ICD-10-CM | POA: Diagnosis not present

## 2021-11-07 DIAGNOSIS — E1122 Type 2 diabetes mellitus with diabetic chronic kidney disease: Secondary | ICD-10-CM | POA: Diagnosis not present

## 2021-11-07 DIAGNOSIS — I130 Hypertensive heart and chronic kidney disease with heart failure and stage 1 through stage 4 chronic kidney disease, or unspecified chronic kidney disease: Secondary | ICD-10-CM | POA: Diagnosis not present

## 2021-11-09 DIAGNOSIS — I130 Hypertensive heart and chronic kidney disease with heart failure and stage 1 through stage 4 chronic kidney disease, or unspecified chronic kidney disease: Secondary | ICD-10-CM | POA: Diagnosis not present

## 2021-11-09 DIAGNOSIS — Z48811 Encounter for surgical aftercare following surgery on the nervous system: Secondary | ICD-10-CM | POA: Diagnosis not present

## 2021-11-09 DIAGNOSIS — N1832 Chronic kidney disease, stage 3b: Secondary | ICD-10-CM | POA: Diagnosis not present

## 2021-11-09 DIAGNOSIS — I509 Heart failure, unspecified: Secondary | ICD-10-CM | POA: Diagnosis not present

## 2021-11-09 DIAGNOSIS — E1122 Type 2 diabetes mellitus with diabetic chronic kidney disease: Secondary | ICD-10-CM | POA: Diagnosis not present

## 2021-11-09 DIAGNOSIS — M545 Low back pain, unspecified: Secondary | ICD-10-CM | POA: Diagnosis not present

## 2021-11-10 DIAGNOSIS — I509 Heart failure, unspecified: Secondary | ICD-10-CM | POA: Diagnosis not present

## 2021-11-10 DIAGNOSIS — I130 Hypertensive heart and chronic kidney disease with heart failure and stage 1 through stage 4 chronic kidney disease, or unspecified chronic kidney disease: Secondary | ICD-10-CM | POA: Diagnosis not present

## 2021-11-10 DIAGNOSIS — H3500 Unspecified background retinopathy: Secondary | ICD-10-CM | POA: Diagnosis not present

## 2021-11-10 DIAGNOSIS — M545 Low back pain, unspecified: Secondary | ICD-10-CM | POA: Diagnosis not present

## 2021-11-10 DIAGNOSIS — E1129 Type 2 diabetes mellitus with other diabetic kidney complication: Secondary | ICD-10-CM | POA: Diagnosis not present

## 2021-11-10 DIAGNOSIS — N3281 Overactive bladder: Secondary | ICD-10-CM | POA: Diagnosis not present

## 2021-11-10 DIAGNOSIS — E1122 Type 2 diabetes mellitus with diabetic chronic kidney disease: Secondary | ICD-10-CM | POA: Diagnosis not present

## 2021-11-10 DIAGNOSIS — G5602 Carpal tunnel syndrome, left upper limb: Secondary | ICD-10-CM | POA: Diagnosis not present

## 2021-11-10 DIAGNOSIS — Z48811 Encounter for surgical aftercare following surgery on the nervous system: Secondary | ICD-10-CM | POA: Diagnosis not present

## 2021-11-10 DIAGNOSIS — Z7901 Long term (current) use of anticoagulants: Secondary | ICD-10-CM | POA: Diagnosis not present

## 2021-11-10 DIAGNOSIS — E039 Hypothyroidism, unspecified: Secondary | ICD-10-CM | POA: Diagnosis not present

## 2021-11-10 DIAGNOSIS — Z6833 Body mass index (BMI) 33.0-33.9, adult: Secondary | ICD-10-CM | POA: Diagnosis not present

## 2021-11-10 DIAGNOSIS — Z853 Personal history of malignant neoplasm of breast: Secondary | ICD-10-CM | POA: Diagnosis not present

## 2021-11-10 DIAGNOSIS — N1832 Chronic kidney disease, stage 3b: Secondary | ICD-10-CM | POA: Diagnosis not present

## 2021-11-10 DIAGNOSIS — Z950 Presence of cardiac pacemaker: Secondary | ICD-10-CM | POA: Diagnosis not present

## 2021-11-10 DIAGNOSIS — E7800 Pure hypercholesterolemia, unspecified: Secondary | ICD-10-CM | POA: Diagnosis not present

## 2021-11-10 DIAGNOSIS — Z7984 Long term (current) use of oral hypoglycemic drugs: Secondary | ICD-10-CM | POA: Diagnosis not present

## 2021-11-10 DIAGNOSIS — Z794 Long term (current) use of insulin: Secondary | ICD-10-CM | POA: Diagnosis not present

## 2021-11-10 DIAGNOSIS — E113591 Type 2 diabetes mellitus with proliferative diabetic retinopathy without macular edema, right eye: Secondary | ICD-10-CM | POA: Diagnosis not present

## 2021-11-10 DIAGNOSIS — I4821 Permanent atrial fibrillation: Secondary | ICD-10-CM | POA: Diagnosis not present

## 2021-11-10 DIAGNOSIS — E538 Deficiency of other specified B group vitamins: Secondary | ICD-10-CM | POA: Diagnosis not present

## 2021-11-16 DIAGNOSIS — N1832 Chronic kidney disease, stage 3b: Secondary | ICD-10-CM | POA: Diagnosis not present

## 2021-11-16 DIAGNOSIS — Z48811 Encounter for surgical aftercare following surgery on the nervous system: Secondary | ICD-10-CM | POA: Diagnosis not present

## 2021-11-16 DIAGNOSIS — I509 Heart failure, unspecified: Secondary | ICD-10-CM | POA: Diagnosis not present

## 2021-11-16 DIAGNOSIS — I130 Hypertensive heart and chronic kidney disease with heart failure and stage 1 through stage 4 chronic kidney disease, or unspecified chronic kidney disease: Secondary | ICD-10-CM | POA: Diagnosis not present

## 2021-11-16 DIAGNOSIS — E1122 Type 2 diabetes mellitus with diabetic chronic kidney disease: Secondary | ICD-10-CM | POA: Diagnosis not present

## 2021-11-16 DIAGNOSIS — M545 Low back pain, unspecified: Secondary | ICD-10-CM | POA: Diagnosis not present

## 2021-11-17 ENCOUNTER — Other Ambulatory Visit (HOSPITAL_COMMUNITY): Payer: Self-pay | Admitting: Student

## 2021-11-17 ENCOUNTER — Other Ambulatory Visit: Payer: Self-pay | Admitting: Student

## 2021-11-17 DIAGNOSIS — L24A9 Irritant contact dermatitis due friction or contact with other specified body fluids: Secondary | ICD-10-CM

## 2021-11-18 DIAGNOSIS — I130 Hypertensive heart and chronic kidney disease with heart failure and stage 1 through stage 4 chronic kidney disease, or unspecified chronic kidney disease: Secondary | ICD-10-CM | POA: Diagnosis not present

## 2021-11-18 DIAGNOSIS — N1832 Chronic kidney disease, stage 3b: Secondary | ICD-10-CM | POA: Diagnosis not present

## 2021-11-18 DIAGNOSIS — M545 Low back pain, unspecified: Secondary | ICD-10-CM | POA: Diagnosis not present

## 2021-11-18 DIAGNOSIS — I509 Heart failure, unspecified: Secondary | ICD-10-CM | POA: Diagnosis not present

## 2021-11-18 DIAGNOSIS — E1122 Type 2 diabetes mellitus with diabetic chronic kidney disease: Secondary | ICD-10-CM | POA: Diagnosis not present

## 2021-11-18 DIAGNOSIS — Z48811 Encounter for surgical aftercare following surgery on the nervous system: Secondary | ICD-10-CM | POA: Diagnosis not present

## 2021-11-21 DIAGNOSIS — Z48811 Encounter for surgical aftercare following surgery on the nervous system: Secondary | ICD-10-CM | POA: Diagnosis not present

## 2021-11-21 DIAGNOSIS — I509 Heart failure, unspecified: Secondary | ICD-10-CM | POA: Diagnosis not present

## 2021-11-21 DIAGNOSIS — I130 Hypertensive heart and chronic kidney disease with heart failure and stage 1 through stage 4 chronic kidney disease, or unspecified chronic kidney disease: Secondary | ICD-10-CM | POA: Diagnosis not present

## 2021-11-21 DIAGNOSIS — M545 Low back pain, unspecified: Secondary | ICD-10-CM | POA: Diagnosis not present

## 2021-11-21 DIAGNOSIS — E1122 Type 2 diabetes mellitus with diabetic chronic kidney disease: Secondary | ICD-10-CM | POA: Diagnosis not present

## 2021-11-21 DIAGNOSIS — N1832 Chronic kidney disease, stage 3b: Secondary | ICD-10-CM | POA: Diagnosis not present

## 2021-11-23 DIAGNOSIS — M545 Low back pain, unspecified: Secondary | ICD-10-CM | POA: Diagnosis not present

## 2021-11-23 DIAGNOSIS — N1832 Chronic kidney disease, stage 3b: Secondary | ICD-10-CM | POA: Diagnosis not present

## 2021-11-23 DIAGNOSIS — I130 Hypertensive heart and chronic kidney disease with heart failure and stage 1 through stage 4 chronic kidney disease, or unspecified chronic kidney disease: Secondary | ICD-10-CM | POA: Diagnosis not present

## 2021-11-23 DIAGNOSIS — Z48811 Encounter for surgical aftercare following surgery on the nervous system: Secondary | ICD-10-CM | POA: Diagnosis not present

## 2021-11-23 DIAGNOSIS — I509 Heart failure, unspecified: Secondary | ICD-10-CM | POA: Diagnosis not present

## 2021-11-23 DIAGNOSIS — E1122 Type 2 diabetes mellitus with diabetic chronic kidney disease: Secondary | ICD-10-CM | POA: Diagnosis not present

## 2021-11-28 ENCOUNTER — Ambulatory Visit (INDEPENDENT_AMBULATORY_CARE_PROVIDER_SITE_OTHER): Payer: Medicare Other

## 2021-11-28 DIAGNOSIS — I4821 Permanent atrial fibrillation: Secondary | ICD-10-CM

## 2021-11-29 DIAGNOSIS — N1832 Chronic kidney disease, stage 3b: Secondary | ICD-10-CM | POA: Diagnosis not present

## 2021-11-29 DIAGNOSIS — E1122 Type 2 diabetes mellitus with diabetic chronic kidney disease: Secondary | ICD-10-CM | POA: Diagnosis not present

## 2021-11-29 DIAGNOSIS — Z48811 Encounter for surgical aftercare following surgery on the nervous system: Secondary | ICD-10-CM | POA: Diagnosis not present

## 2021-11-29 DIAGNOSIS — I509 Heart failure, unspecified: Secondary | ICD-10-CM | POA: Diagnosis not present

## 2021-11-29 DIAGNOSIS — I130 Hypertensive heart and chronic kidney disease with heart failure and stage 1 through stage 4 chronic kidney disease, or unspecified chronic kidney disease: Secondary | ICD-10-CM | POA: Diagnosis not present

## 2021-11-29 DIAGNOSIS — M545 Low back pain, unspecified: Secondary | ICD-10-CM | POA: Diagnosis not present

## 2021-11-29 LAB — CUP PACEART REMOTE DEVICE CHECK
Battery Remaining Longevity: 107 mo
Battery Voltage: 3 V
Brady Statistic AP VP Percent: 0.16 %
Brady Statistic AP VS Percent: 99.73 %
Brady Statistic AS VP Percent: 0 %
Brady Statistic AS VS Percent: 0.11 %
Brady Statistic RA Percent Paced: 99.95 %
Brady Statistic RV Percent Paced: 0.16 %
Date Time Interrogation Session: 20230918044621
Implantable Lead Implant Date: 20200921
Implantable Lead Implant Date: 20200921
Implantable Lead Location: 753858
Implantable Lead Location: 753860
Implantable Lead Model: 3830
Implantable Lead Model: 5076
Implantable Pulse Generator Implant Date: 20200921
Lead Channel Impedance Value: 342 Ohm
Lead Channel Impedance Value: 399 Ohm
Lead Channel Impedance Value: 399 Ohm
Lead Channel Impedance Value: 513 Ohm
Lead Channel Sensing Intrinsic Amplitude: 11.5 mV
Lead Channel Sensing Intrinsic Amplitude: 11.5 mV
Lead Channel Sensing Intrinsic Amplitude: 11.625 mV
Lead Channel Sensing Intrinsic Amplitude: 11.625 mV
Lead Channel Setting Pacing Amplitude: 2.5 V
Lead Channel Setting Pacing Amplitude: 2.5 V
Lead Channel Setting Pacing Pulse Width: 0.4 ms
Lead Channel Setting Sensing Sensitivity: 1.2 mV

## 2021-12-01 DIAGNOSIS — L24A9 Irritant contact dermatitis due friction or contact with other specified body fluids: Secondary | ICD-10-CM | POA: Diagnosis not present

## 2021-12-03 NOTE — Progress Notes (Unsigned)
Cardiology Office Note:    Date:  12/06/2021   ID:  Summer Hawkins, DOB 10-15-1934, MRN [id number]  PCP:  Haywood Pao, MD   [facility] Providers Cardiologist:  Lauree Chandler, MD Electrophysiologist:  Cristopher Peru, MD     Referring MD: Haywood Pao, MD   Chief Complaint: follow-up bradycardia, a fib  History of Present Illness:    Summer Hawkins is a pleasant 87 y.o. female with a hx of persistent atrial fibrillation, bradycardia, high grade AV block s/p PPM 11/2018, diabetes, hypothyroidism, HTN, and hyperlipidemia.  Found to have atrial fibrillation in 2012, no awareness of palpitations.  Nuclear stress test revealed no stress-induced ischemia, low risk. She had normal LVEF at the time.  She was started on Toprol-XL and Pradaxa.  She has since been changed to Eliquis.  Echo 01/12/2011 with normal LV size and function, LVEF 55 to 60%, mild LVH, moderate TR, mild MR.  She was cardioverted on 02/27/2011.    Seen in primary care May 2019 with dyspnea and weakness.  Heart rate was in the 40s with atrial fibrillation. Toprol was reduced to 25 mg daily. She was felt to be volume overloaded and started on Lasix 20 mg daily. Seen by Dr. Angelena Form 07/13/17 and HR had improved, symptoms had resolved. 48 hr cardiac monitor 10/30/2017 showed atrial fibrillation with lowest HR 57 bpm, PVCs. Echo 07/2017 with LVEF 55-60%, no wma, no significant valve disease. [facility]16[facility]18/2019 with symptomatic bradycardia and decompensated CHF.  Her heart rate was in the 40s.  Her beta-blocker was stopped and her heart rate improved.  She was diuresed with IV Lasix.  Norvasc was added due to uncontrolled HTN.  Seen in EP clinic November 2019 and it was felt she did not need a pacemaker.  Event monitor November 2019 with no high-grade AV block.  She was seen in the ED 11/15/2018 after a syncopal event and was found to be in atrial fibrillation.  She was orthostatic at office follow-up on 11/21/2018  and she underwent pacemaker implant on 12/02/2018.  No recurrent syncope.  He was last seen in our office on 11/08/2020 by Dr. Camillia Herter time there was no evidence of volume overload on exam.  She was cleared for upcoming back surgery and advised to hold Eliquis 3 days prior to surgery.  Follow-up in 1 year was recommended.  Today, she is here for one year follow-up. Ambulates with a rollator walker. Reports she was hospitalized for 5 months after back surgery due to wound complications. Was released on March 10, 2021. Her husband passed away just prior to surgery. Still has a wound on her back and has just completed PT. Is living at home independently with help from an assistant 2 mornings per week. Is able to cook, do laundry, ADLs. Has trouble with vacuuming, more strenuous chores. BP has been well-controlled throughout hospitalization and recovery. Takes diuretic only if face, legs, or hands are swollen. Lasix is not on her list, asked if she was referring to spironolactone but she is unsure. Limits dietary sodium except enjoys hot dog Tuesday at church each week. She denies chest pain, shortness of breath, lower extremity edema, fatigue, palpitations, melena, hematuria, hemoptysis, diaphoresis, presyncope, syncope, orthopnea, and PND.  Past Medical History:  Diagnosis Date   Atrial fibrillation ([place])    Breast cancer ([place])    Breast cancer, left ([place])    Chronic kidney disease    stage III - patient was unaware   Diabetes mellitus  Dyspnea    Dysrhythmia    Afib   H/O: hysterectomy    History of colonoscopy 01/25/2010   History of mammogram 08/04/2009   Hyperlipidemia    Hypertension    Hypothyroidism    Ketoacidosis, diabetic, no coma, non-insulin dependent    Type II   Vitamin B12 deficiency     Past Surgical History:  Procedure Laterality Date   ABDOMINAL HYSTERECTOMY     Bilateral foot surgery     BREAST EXCISIONAL BIOPSY Left 12/2015   BREAST LUMPECTOMY Left    2017   BREAST  LUMPECTOMY WITH RADIOACTIVE SEED AND SENTINEL LYMPH NODE BIOPSY Left 01/19/2016   Procedure: LEFT BREAST LUMPECTOMY WITH RADIOACTIVE SEED AND SENTINEL LYMPH NODE BIOPSY;  Surgeon: Autumn Messing III, MD;  Location: [place];  Service: General;  Laterality: Left;   BREAST LUMPECTOMY WITH RADIOACTIVE SEED LOCALIZATION Left 01/19/2016   CARDIOVERSION  02/27/2011   Procedure: CARDIOVERSION;  Surgeon: Loralie Champagne, MD;  Location: [place];  Service: Cardiovascular;  Laterality: N/A;   LAPAROSCOPIC CHOLECYSTECTOMY     LUMBAR WOUND DEBRIDEMENT N/A 12/20/2020   Procedure: LUMBAR WOUND DEBRIDEMENT WITH PLACEMENTOF LUMBAR WOUND [place];  Surgeon: Newman Pies, MD;  Location: [place];  Service: Neurosurgery;  Laterality: N/A;   PACEMAKER IMPLANT N/A 12/02/2018   Procedure: PACEMAKER IMPLANT;  Surgeon: Evans Lance, MD;  Location: [place] [facility];  Service: Cardiovascular;  Laterality: N/A;   ROTATOR CUFF REPAIR Left    TUBAL LIGATION      Current Medications: Current Meds  Medication Sig   acetaminophen (TYLENOL) 500 MG tablet Take 1,000 mg by mouth every 8 (eight) hours as needed (pain/headache).   amLODipine (NORVASC) 5 MG tablet Take 5 mg by mouth in the morning. (1000)   anastrozole (ARIMIDEX) 1 MG tablet Take 1 tablet (1 mg total) by mouth daily.   cyclobenzaprine (FLEXERIL) 10 MG tablet Take 1 tablet (10 mg total) by mouth 3 (three) times daily as needed for muscle spasms.   docusate sodium (COLACE) 100 MG capsule Take 1 capsule (100 mg total) by mouth 2 (two) times daily.   ELIQUIS 5 MG TABS tablet Take 2.5 mg by mouth 2 (two) times daily.   ferrous sulfate 325 (65 FE) MG tablet Take 325 mg by mouth daily with breakfast. (0830)   insulin aspart (NOVOLOG) 100 UNIT/ML injection 0-15 Units, Subcutaneous, 3 times daily with meals, CBG < 70: Implement Hypoglycemia measures CBG 70 - 120: 0 units CBG 121 - 150: 2 units CBG 151 - 200: 3 units CBG 201 - 250: 5 units CBG 251 - 300: 8 units CBG 301 - 350: 11 units  CBG 351 - 400: 15 units CBG > 400: call MD   insulin detemir (LEVEMIR) 100 UNIT/ML injection Inject 0.12 mLs (12 Units total) into the skin at bedtime.   irbesartan (AVAPRO) 300 MG tablet Take 300 mg by mouth daily.   levothyroxine (SYNTHROID) 137 MCG tablet Take 137 mcg by mouth daily before breakfast. (0630)   metFORMIN (GLUCOPHAGE) 850 MG tablet Take 850 mg by mouth 2 (two) times daily with a meal. (1200 & 1800)   metoprolol succinate (TOPROL-XL) 25 MG 24 hr tablet TAKE 1 TABLET BY MOUTH ONCE  DAILY   Multiple Vitamins-Minerals (MULTIVITAMIN WITH MINERALS) tablet Take 1 tablet by mouth in the morning. (1000)   nystatin cream (MYCOSTATIN) Apply 1 application topically 2 (two) times daily.   oxyCODONE (OXY IR/ROXICODONE) 5 MG immediate release tablet Take 1 tablet (5 mg total) by  mouth every 3 (three) hours as needed for moderate pain ((score 4 to 6)).   polyethylene glycol (MIRALAX / GLYCOLAX) 17 g packet Take 17 g by mouth daily at 4 PM. (1700)   saccharomyces boulardii (FLORASTOR) 250 MG capsule Take 250 mg by mouth 2 (two) times daily.   senna (SENOKOT) 8.6 MG TABS tablet Take 2 tablets by mouth at bedtime. (2200)   spironolactone (ALDACTONE) 25 MG tablet Take 25 mg by mouth in the morning. (1000)   timolol (TIMOPTIC) 0.5 % ophthalmic solution Place 1 drop into both eyes every morning. (1000)   TOUJEO SOLOSTAR 300 UNIT/ML Solostar Pen Inject into the skin.     Allergies:   Bee venom, Penicillins, Lisinopril, Zocor [simvastatin], and Liraglutide   Social History   Socioeconomic History   Marital status: Widowed    Spouse name: Not on file   Number of children: 2   Years of education: Not on file   Highest education level: Not on file  Occupational History    Employer: OTHER    Comment: Worked at [employer] Use   Smoking status: Never   Smokeless tobacco: Never  Vaping Use   Vaping Use: Never used  Substance and Sexual Activity   Alcohol use: No    Alcohol/week: 0.0  standard drinks of alcohol   Drug use: No   Sexual activity: Not on file  Other Topics Concern   Not on file  Social History Narrative   Patient since 31   Husband with prostate cancer   10-siblings-no cancer   Social Determinants of Health   Financial Resource Strain: Not on file  Food Insecurity: No Food Insecurity (03/09/2021)   Hunger Vital Sign    Worried About Running Out of Food in the Last Year: Never true    Ran Out of Food in the Last Year: Never true  Transportation Needs: No Transportation Needs (03/09/2021)   PRAPARE - Hydrologist (Medical): No    Lack of Transportation (Non-Medical): No  Physical Activity: Not on file  Stress: Not on file  Social Connections: Not on file     Family History: The patient's family history includes Breast cancer in her sister; Diabetes (age of onset: 63) in her mother; Heart failure (age of onset: 29) in her father; Pneumonia (age of onset: 70) in her mother. There is no history of Cancer.  ROS:   Please see the history of present illness.  All other systems reviewed and are negative.  Labs/Other Studies Reviewed:    The following studies were reviewed today:  Pacemaker Implant 11/2018  CONCLUSIONS:   1. Successful implantation of a Medtronic dual-chamber pacemaker for symptomatic bradycardia due to high grade AV block  2. No early apparent complications.     Cardiac monitor 01/2018  Atrial fibrillation Rates between 31 beats per minute up to 140 beats per minute No long pauses.    She has been seen in EP. Pacemaker not indicated currently in absence of symptoms that can be attributed to bradycardia. (syncope or worsened weakness).   Echo 07/2017  - Left ventricle: The cavity size was normal. Wall thickness was    increased in a pattern of mild LVH. Systolic function was normal.    The estimated ejection fraction was in the range of 55% to 60%.    Wall motion was normal; there were no  regional wall motion    abnormalities.  - Mitral valve: Mildly to moderately calcified annulus.  Mildly    thickened leaflets .  - Left atrium: The atrium was mildly dilated.  - Pulmonary arteries: Systolic pressure was mildly increased. PA    peak pressure: 36 mm Hg (S).   Recent Labs: 12/21/2020: BUN 26; Creatinine, Ser 1.57; Hemoglobin 10.7; Platelets 373; Potassium 3.5; Sodium 141  Recent Lipid Panel No results found for: "CHOL", "TRIG", "HDL", "CHOLHDL", "VLDL", "LDLCALC", "LDLDIRECT"   Risk Assessment/Calculations:    CHA2DS2-VASc Score = 5  This indicates a 7.2% annual risk of stroke. The patient's score is based upon: CHF History: 0 HTN History: 1 Diabetes History: 1 Stroke History: 0 Vascular Disease History: 0 Age Score: 2 Gender Score: 1    Physical Exam:    VS:  BP 132/60   Pulse 73   Ht '5\' 5"'$  (1.651 m)   Wt 186 lb (84.4 kg)   LMP  (LMP Unknown)   SpO2 98%   BMI 30.95 kg/m     Wt Readings from Last 3 Encounters:  12/06/21 186 lb (84.4 kg)  12/20/20 200 lb (90.7 kg)  11/30/20 210 lb 1.6 oz (95.3 kg)     GEN:  Well nourished, well developed in no acute distress HEENT: Normal NECK: No JVD; No carotid bruits CARDIAC: RRR, no murmurs, rubs, gallops RESPIRATORY:  Clear to auscultation without rales, wheezing or rhonchi  ABDOMEN: Soft, non-tender, non-distended MUSCULOSKELETAL:  No edema; No deformity. 2+ pedal pulses, equal bilaterally SKIN: Warm and dry NEUROLOGIC:  Alert and oriented x 3 PSYCHIATRIC:  Normal affect   EKG:  EKG is ordered today.  The ekg ordered today demonstrates V paced with permanent atrial fibrillation underlying at 73 bpm, no acute change from previous    Diagnoses:    1. Chronic diastolic CHF (congestive heart failure) ([place])   2. Permanent atrial fibrillation ([place])   3. Essential hypertension   4. Symptomatic bradycardia   5. Pacemaker    Assessment and Plan:     Permanent atrial fibrillation on chronic  anticoagulation: Rate is well-controlled. No palpitations, chest pain, dyspnea, or other concerning symptoms. No bleeding concerns. Eliquis dose 2.5 mg twice daily is appropriate dose for age/creatinine. Continue Eliquis, metoprolol.   Chronic HFpEF: Admission for decompensated CHF, normal LVEF on echo 07/2017.  She reports weight is stable. Denies dyspnea, orthopnea, PND, edema. Takes as needed Lasix for edema in face, hands, or feet. Appears euvolemic on exam today. Continue irbesartan, metoprolol, spironolactone.   Symptomatic bradycardia s/p PPM implant: HR 73 bpm today. She denies presyncope, syncope. Is overdue for EP follow-up, we will schedule today.   Hypertension: BP is well-controlled. Reports no concerns about elevated BP throughout her entire hospitalization and recovery. No medication changes today.     Disposition: See Dr. Lovena Le in October/6 months with Dr. Angelena Form  Medication Adjustments/Labs and Tests Ordered: Current medicines are reviewed at length with the patient today.  Concerns regarding medicines are outlined above.  Orders Placed This Encounter  Procedures   EKG 12-Lead   No orders of the defined types were placed in this encounter.   Patient Instructions  Medication Instructions:   Your physician recommends that you continue on your current medications as directed. Please refer to the Current Medication list given to you today.   *If you need a refill on your cardiac medications before your next appointment, please call your pharmacy*   Lab Work:  None ordered.  If you have labs (blood work) drawn today and your tests are completely normal, you will receive your results  only by: [employer] (if you have MyChart) OR A paper copy in the mail If you have any lab test that is abnormal or we need to change your treatment, we will call you to review the results.   Testing/Procedures:  None ordered.   Follow-Up: At [facility], you and  your health needs are our priority.  As part of our continuing mission to provide you with exceptional heart care, we have created designated Provider Care Teams.  These Care Teams include your primary Cardiologist (physician) and Advanced Practice Providers (APPs -  Physician Assistants and Nurse Practitioners) who all work together to provide you with the care you need, when you need it.  We recommend signing up for the patient portal called "MyChart".  Sign up information is provided on this After Visit Summary.  MyChart is used to connect with patients for Virtual Visits (Telemedicine).  Patients are able to view lab/test results, encounter notes, upcoming appointments, etc.  Non-urgent messages can be sent to your provider as well.   To learn more about what you can do with MyChart, go to [email or web address].    Your next appointment:   6 month(s)  The format for your next appointment:   In Person  Provider:   Lauree Chandler, MD     Important Information About Sugar         Signed, Emmaline Life, NP  12/06/2021 2:27 PM    [place]

## 2021-12-05 DIAGNOSIS — Z48811 Encounter for surgical aftercare following surgery on the nervous system: Secondary | ICD-10-CM | POA: Diagnosis not present

## 2021-12-05 DIAGNOSIS — I509 Heart failure, unspecified: Secondary | ICD-10-CM | POA: Diagnosis not present

## 2021-12-05 DIAGNOSIS — M545 Low back pain, unspecified: Secondary | ICD-10-CM | POA: Diagnosis not present

## 2021-12-05 DIAGNOSIS — I130 Hypertensive heart and chronic kidney disease with heart failure and stage 1 through stage 4 chronic kidney disease, or unspecified chronic kidney disease: Secondary | ICD-10-CM | POA: Diagnosis not present

## 2021-12-05 DIAGNOSIS — N1832 Chronic kidney disease, stage 3b: Secondary | ICD-10-CM | POA: Diagnosis not present

## 2021-12-05 DIAGNOSIS — E1122 Type 2 diabetes mellitus with diabetic chronic kidney disease: Secondary | ICD-10-CM | POA: Diagnosis not present

## 2021-12-06 ENCOUNTER — Encounter: Payer: Self-pay | Admitting: Nurse Practitioner

## 2021-12-06 ENCOUNTER — Ambulatory Visit: Payer: Medicare Other | Attending: Nurse Practitioner | Admitting: Nurse Practitioner

## 2021-12-06 VITALS — BP 132/60 | HR 73 | Ht 65.0 in | Wt 186.0 lb

## 2021-12-06 DIAGNOSIS — R001 Bradycardia, unspecified: Secondary | ICD-10-CM | POA: Diagnosis not present

## 2021-12-06 DIAGNOSIS — I4821 Permanent atrial fibrillation: Secondary | ICD-10-CM | POA: Insufficient documentation

## 2021-12-06 DIAGNOSIS — I10 Essential (primary) hypertension: Secondary | ICD-10-CM | POA: Insufficient documentation

## 2021-12-06 DIAGNOSIS — I5032 Chronic diastolic (congestive) heart failure: Secondary | ICD-10-CM | POA: Diagnosis not present

## 2021-12-06 DIAGNOSIS — Z950 Presence of cardiac pacemaker: Secondary | ICD-10-CM | POA: Diagnosis not present

## 2021-12-06 NOTE — Patient Instructions (Signed)
Medication Instructions:   Your physician recommends that you continue on your current medications as directed. Please refer to the Current Medication list given to you today.   *If you need a refill on your cardiac medications before your next appointment, please call your pharmacy*   Lab Work:  None ordered.  If you have labs (blood work) drawn today and your tests are completely normal, you will receive your results only by: MyChart Message (if you have MyChart) OR A paper copy in the mail If you have any lab test that is abnormal or we need to change your treatment, we will call you to review the results.   Testing/Procedures:  None ordered.   Follow-Up: At [facility], you and your health needs are our priority.  As part of our continuing mission to provide you with exceptional heart care, we have created designated Provider Care Teams.  These Care Teams include your primary Cardiologist (physician) and Advanced Practice Providers (APPs -  Physician Assistants and Nurse Practitioners) who all work together to provide you with the care you need, when you need it.  We recommend signing up for the patient portal called "MyChart".  Sign up information is provided on this After Visit Summary.  MyChart is used to connect with patients for Virtual Visits (Telemedicine).  Patients are able to view lab/test results, encounter notes, upcoming appointments, etc.  Non-urgent messages can be sent to your provider as well.   To learn more about what you can do with MyChart, go to [email or web address].    Your next appointment:   6 month(s)  The format for your next appointment:   In Person  Provider:   Christopher McAlhany, MD      Important Information About Sugar       

## 2021-12-07 DIAGNOSIS — E1122 Type 2 diabetes mellitus with diabetic chronic kidney disease: Secondary | ICD-10-CM | POA: Diagnosis not present

## 2021-12-07 DIAGNOSIS — N1832 Chronic kidney disease, stage 3b: Secondary | ICD-10-CM | POA: Diagnosis not present

## 2021-12-07 DIAGNOSIS — M545 Low back pain, unspecified: Secondary | ICD-10-CM | POA: Diagnosis not present

## 2021-12-07 DIAGNOSIS — I509 Heart failure, unspecified: Secondary | ICD-10-CM | POA: Diagnosis not present

## 2021-12-07 DIAGNOSIS — I130 Hypertensive heart and chronic kidney disease with heart failure and stage 1 through stage 4 chronic kidney disease, or unspecified chronic kidney disease: Secondary | ICD-10-CM | POA: Diagnosis not present

## 2021-12-07 DIAGNOSIS — Z48811 Encounter for surgical aftercare following surgery on the nervous system: Secondary | ICD-10-CM | POA: Diagnosis not present

## 2021-12-10 NOTE — Progress Notes (Signed)
Remote pacemaker transmission.   

## 2021-12-20 DIAGNOSIS — Z6835 Body mass index (BMI) 35.0-35.9, adult: Secondary | ICD-10-CM | POA: Diagnosis not present

## 2021-12-20 DIAGNOSIS — L24A9 Irritant contact dermatitis due friction or contact with other specified body fluids: Secondary | ICD-10-CM | POA: Diagnosis not present

## 2021-12-20 DIAGNOSIS — M4316 Spondylolisthesis, lumbar region: Secondary | ICD-10-CM | POA: Diagnosis not present

## 2021-12-27 DIAGNOSIS — E039 Hypothyroidism, unspecified: Secondary | ICD-10-CM | POA: Diagnosis not present

## 2021-12-27 DIAGNOSIS — E7800 Pure hypercholesterolemia, unspecified: Secondary | ICD-10-CM | POA: Diagnosis not present

## 2021-12-29 ENCOUNTER — Encounter: Payer: Self-pay | Admitting: *Deleted

## 2021-12-29 DIAGNOSIS — Z23 Encounter for immunization: Secondary | ICD-10-CM | POA: Diagnosis not present

## 2021-12-29 NOTE — Congregational Nurse Program (Signed)
[id number]/spoke with Tamantha about the open wound that has formed on the base of the lumbar area from surgery.  She is going to the md concerning this and will need have surgery in order to repair the site and to removed necrotic tissues.  She is calm about this but is worried.  Has support and transportation.

## 2021-12-30 ENCOUNTER — Ambulatory Visit: Payer: Medicare Other | Attending: Internal Medicine | Admitting: Internal Medicine

## 2021-12-30 ENCOUNTER — Encounter: Payer: Self-pay | Admitting: Internal Medicine

## 2021-12-30 VITALS — BP 132/78 | HR 73 | Ht 65.0 in | Wt 202.0 lb

## 2021-12-30 DIAGNOSIS — I4821 Permanent atrial fibrillation: Secondary | ICD-10-CM | POA: Diagnosis not present

## 2021-12-30 DIAGNOSIS — Z950 Presence of cardiac pacemaker: Secondary | ICD-10-CM | POA: Diagnosis not present

## 2021-12-30 NOTE — Progress Notes (Signed)
HPI Summer Hawkins returns today for followup. She is a pleasant 87 yo woman with symptomatic bradycardia due to CHB, s/p PPM insertion. She has a h/o syncope. She has done well with her PM insertion. She denies chest pain or sob. She has had trouble with healing after back surgery.  Allergies  Allergen Reactions   Bee Venom Shortness Of Breath, Nausea And Vomiting and Other (See Comments)    Makes the patient feel faint, also   Penicillins Anaphylaxis, Hives, Swelling and Other (See Comments)    Has patient had a PCN reaction causing immediate rash, facial/tongue/throat swelling, SOB or lightheadedness with hypotension: Yes Has patient had a PCN reaction causing severe rash involving mucus membranes or skin necrosis: No Has patient had a PCN reaction that required hospitalization: No Has patient had a PCN reaction occurring within the last 10 years: No If all of the above answers are "NO", then may proceed with Cephalosporin use.    Lisinopril Cough   Zocor [Simvastatin] Other (See Comments)    memory changes   Liraglutide Rash and Other (See Comments)    Rash at injection site     Current Outpatient Medications  Medication Sig Dispense Refill   acetaminophen (TYLENOL) 500 MG tablet Take 1,000 mg by mouth every 8 (eight) hours as needed (pain/headache).     amLODipine (NORVASC) 5 MG tablet Take 5 mg by mouth in the morning. (1000)     doxycycline (VIBRA-TABS) 100 MG tablet Take 100 mg by mouth 2 (two) times daily.     ELIQUIS 5 MG TABS tablet Take 2.5 mg by mouth 2 (two) times daily.     ferrous sulfate 325 (65 FE) MG tablet Take 325 mg by mouth daily with breakfast. (0830)     insulin aspart (NOVOLOG) 100 UNIT/ML injection 0-15 Units, Subcutaneous, 3 times daily with meals, CBG < 70: Implement Hypoglycemia measures CBG 70 - 120: 0 units CBG 121 - 150: 2 units CBG 151 - 200: 3 units CBG 201 - 250: 5 units CBG 251 - 300: 8 units CBG 301 - 350: 11 units CBG 351 - 400: 15 units CBG >  400: call MD 10 mL 11   irbesartan (AVAPRO) 300 MG tablet Take 300 mg by mouth daily.     levothyroxine (SYNTHROID) 137 MCG tablet Take 137 mcg by mouth daily before breakfast. (0630)     metFORMIN (GLUCOPHAGE) 850 MG tablet Take 850 mg by mouth 2 (two) times daily with a meal. (1200 & 1800)     metoprolol succinate (TOPROL-XL) 25 MG 24 hr tablet TAKE 1 TABLET BY MOUTH ONCE  DAILY 90 tablet 1   Multiple Vitamins-Minerals (MULTIVITAMIN WITH MINERALS) tablet Take 1 tablet by mouth in the morning. (1000)     spironolactone (ALDACTONE) 25 MG tablet Take 25 mg by mouth in the morning. (1000)     timolol (TIMOPTIC) 0.5 % ophthalmic solution Place 1 drop into both eyes every morning. (1000)     TOUJEO SOLOSTAR 300 UNIT/ML Solostar Pen Inject into the skin.     No current facility-administered medications for this visit.     Past Medical History:  Diagnosis Date   Atrial fibrillation ([place])    Breast cancer ([place])    Breast cancer, left ([place])    Chronic kidney disease    stage III - patient was unaware   Diabetes mellitus    Dyspnea    Dysrhythmia    Afib   H/O: hysterectomy  History of colonoscopy 01/25/2010   History of mammogram 08/04/2009   Hyperlipidemia    Hypertension    Hypothyroidism    Ketoacidosis, diabetic, no coma, non-insulin dependent    Type II   Vitamin B12 deficiency     ROS:   All systems reviewed and negative except as noted in the HPI.   Past Surgical History:  Procedure Laterality Date   ABDOMINAL HYSTERECTOMY     Bilateral foot surgery     BREAST EXCISIONAL BIOPSY Left 12/2015   BREAST LUMPECTOMY Left    2017   BREAST LUMPECTOMY WITH RADIOACTIVE SEED AND SENTINEL LYMPH NODE BIOPSY Left 01/19/2016   Procedure: LEFT BREAST LUMPECTOMY WITH RADIOACTIVE SEED AND SENTINEL LYMPH NODE BIOPSY;  Surgeon: Autumn Messing III, MD;  Location: [place];  Service: General;  Laterality: Left;   BREAST LUMPECTOMY WITH RADIOACTIVE SEED LOCALIZATION Left 01/19/2016    CARDIOVERSION  02/27/2011   Procedure: CARDIOVERSION;  Surgeon: Loralie Champagne, MD;  Location: [place];  Service: Cardiovascular;  Laterality: N/A;   LAPAROSCOPIC CHOLECYSTECTOMY     LUMBAR WOUND DEBRIDEMENT N/A 12/20/2020   Procedure: LUMBAR WOUND DEBRIDEMENT WITH PLACEMENTOF LUMBAR WOUND [place];  Surgeon: Newman Pies, MD;  Location: [place];  Service: Neurosurgery;  Laterality: N/A;   PACEMAKER IMPLANT N/A 12/02/2018   Procedure: PACEMAKER IMPLANT;  Surgeon: Evans Lance, MD;  Location: [place] [facility];  Service: Cardiovascular;  Laterality: N/A;   ROTATOR CUFF REPAIR Left    TUBAL LIGATION       Family History  Problem Relation Age of Onset   Heart failure Father 33       enlarged heart   Pneumonia Mother 84   Diabetes Mother 89   Breast cancer Sister    Cancer Neg Hx      Social History   Socioeconomic History   Marital status: Widowed    Spouse name: Not on file   Number of children: 2   Years of education: Not on file   Highest education level: Not on file  Occupational History    Employer: OTHER    Comment: Worked at [employer] Use   Smoking status: Never   Smokeless tobacco: Never  Vaping Use   Vaping Use: Never used  Substance and Sexual Activity   Alcohol use: No    Alcohol/week: 0.0 standard drinks of alcohol   Drug use: No   Sexual activity: Not on file  Other Topics Concern   Not on file  Social History Narrative   Patient since 55   Husband with prostate cancer   10-siblings-no cancer   Social Determinants of Health   Financial Resource Strain: Not on file  Food Insecurity: No Food Insecurity (03/09/2021)   Hunger Vital Sign    Worried About Running Out of Food in the Last Year: Never true    Ran Out of Food in the Last Year: Never true  Transportation Needs: No Transportation Needs (03/09/2021)   PRAPARE - Hydrologist (Medical): No    Lack of Transportation (Non-Medical): No  Physical Activity: Not on  file  Stress: Not on file  Social Connections: Not on file  Intimate Partner Violence: Not on file     BP 132/78   Pulse 73   Ht '5\' 5"'$  (1.651 m)   Wt 202 lb (91.6 kg)   LMP  (LMP Unknown)   SpO2 97%   BMI 33.61 kg/m   Physical Exam:  Well appearing NAD HEENT:  Unremarkable Neck:  No JVD, no thyromegally Lymphatics:  No adenopathy Back:  No CVA tenderness Lungs:  Clear HEART:  Regular rate rhythm, no murmurs, no rubs, no clicks Abd:  soft, positive bowel sounds, no organomegally, no rebound, no guarding Ext:  2 plus pulses, no edema, no cyanosis, no clubbing Skin:  No rashes no nodules Neuro:  CN II through XII intact, motor grossly intact  DEVICE  Normal device function.  See PaceArt for details.   Assess/Plan:  1. Atrial fib - her VR is well controlled. No change in meds.  2. CHB - she is pacing her His lead about 99% of the time. She is asymptomatic 3. Diastolic ChF - her symptoms remain class 2. She will continue her current meds. 4. Obesity - she has lost almost 20 lbs .   [email or web address].

## 2021-12-30 NOTE — Patient Instructions (Signed)
Medication Instructions:  Your physician recommends that you continue on your current medications as directed. Please refer to the Current Medication list given to you today.  *If you need a refill on your cardiac medications before your next appointment, please call your pharmacy*   Lab Work: None ordered   Testing/Procedures: None ordered   Follow-Up: At [facility], you and your health needs are our priority.  As part of our continuing mission to provide you with exceptional heart care, we have created designated Provider Care Teams.  These Care Teams include your primary Cardiologist (physician) and Advanced Practice Providers (APPs -  Physician Assistants and Nurse Practitioners) who all work together to provide you with the care you need, when you need it.  Remote monitoring is used to monitor your Pacemaker or ICD from home. This monitoring reduces the number of office visits required to check your device to one time per year. It allows [place] to keep an eye on the functioning of your device to ensure it is working properly. You are scheduled for a device check from home on 02/27/22. You may send your transmission at any time that day. If you have a wireless device, the transmission will be sent automatically. After your physician reviews your transmission, you will receive a postcard with your next transmission date.  Your next appointment:   1 year(s)  The format for your next appointment:   In Person  Provider:   Cristopher Peru, MD    Thank you for choosing [facility]!!   Trinidad Curet, RN [phone]    Other Instructions  Important Information About Sugar

## 2022-01-04 ENCOUNTER — Ambulatory Visit (HOSPITAL_COMMUNITY)
Admission: RE | Admit: 2022-01-04 | Discharge: 2022-01-04 | Disposition: A | Discharge status: Home / Self Care | Payer: Medicare Other | Source: Ambulatory Visit | Attending: Student | Admitting: Student

## 2022-01-04 DIAGNOSIS — L24A9 Irritant contact dermatitis due friction or contact with other specified body fluids: Secondary | ICD-10-CM | POA: Insufficient documentation

## 2022-01-04 DIAGNOSIS — M5126 Other intervertebral disc displacement, lumbar region: Secondary | ICD-10-CM | POA: Diagnosis not present

## 2022-01-04 DIAGNOSIS — M47816 Spondylosis without myelopathy or radiculopathy, lumbar region: Secondary | ICD-10-CM | POA: Diagnosis not present

## 2022-01-04 MED ORDER — GADOBUTROL 1 MMOL/ML IV SOLN
9.5000 mL | Freq: Once | INTRAVENOUS | Status: AC | PRN
  Administered 2022-01-04: 9.5 mL via INTRAVENOUS

## 2022-01-04 NOTE — Progress Notes (Signed)
Patient here today at [facility] for MRI lumbar spine w wo contrast. Patient has medtronic device. CLE sent. Del-rep to the bedside to make necessary changes for MRI. Patient programmed VOO 80. MRI surescan on. Will re-program once scan is completed.

## 2022-01-09 ENCOUNTER — Encounter (INDEPENDENT_AMBULATORY_CARE_PROVIDER_SITE_OTHER): Payer: Medicare Other | Admitting: Ophthalmology

## 2022-01-12 ENCOUNTER — Other Ambulatory Visit: Payer: Self-pay | Admitting: Neurosurgery

## 2022-01-12 DIAGNOSIS — Z6834 Body mass index (BMI) 34.0-34.9, adult: Secondary | ICD-10-CM | POA: Diagnosis not present

## 2022-01-12 DIAGNOSIS — L24A9 Irritant contact dermatitis due friction or contact with other specified body fluids: Secondary | ICD-10-CM | POA: Diagnosis not present

## 2022-01-13 ENCOUNTER — Other Ambulatory Visit: Payer: Self-pay | Admitting: Neurosurgery

## 2022-01-16 ENCOUNTER — Other Ambulatory Visit: Payer: Self-pay | Admitting: Neurosurgery

## 2022-01-17 ENCOUNTER — Encounter: Payer: Self-pay | Admitting: Internal Medicine

## 2022-01-17 NOTE — Progress Notes (Signed)
PERIOPERATIVE PRESCRIPTION FOR IMPLANTED CARDIAC DEVICE PROGRAMMING  Patient Information: Name:  Summer Hawkins  DOB:  March 31, 1934  MRN:  [id number]    Planned Procedure:  Lumbar wound revision  Surgeon:  Arnoldo Morale  Date of Procedure:  01/19/22  Cautery will be used.  Position during surgery:  prone   Please send documentation back to:  [facility] (Fax # [phone])  Device Information:  Clinic EP Physician:  Cristopher Peru, MD   Device Type:  Pacemaker Manufacturer and Phone #:  Medtronic: [phone] Pacemaker Dependent?:  Yes.   Date of Last Device Check:  01/04/2022 Normal Device Function?:  Yes.    Electrophysiologist's Recommendations:  Have magnet available. Provide continuous ECG monitoring when magnet is used or reprogramming is to be performed.  Procedure will likely interfere with device function.  Device should be programmed:  Asynchronous pacing during procedure and returned to normal programming after procedure  Per Device Clinic Standing Orders, Simone Curia, RN  4:28 PM 01/17/2022

## 2022-01-17 NOTE — Progress Notes (Signed)
Medtronics rep [place] emailed surgery details for date/time/procedure.  Pre op device orders requested from [facility] group.

## 2022-01-17 NOTE — Progress Notes (Signed)
Surgical Instructions    Your procedure is scheduled on Thursday November 9th.  Report to [facility] Main Entrance "A" at 8 A.M., then check in with the Admitting office.  Call this number if you have problems the morning of surgery:  [phone]   If you have any questions prior to your surgery date call [phone]: Open Monday-Friday 8am-4pm If you experience any cold or flu symptoms such as cough, fever, chills, shortness of breath, etc. between now and your scheduled surgery, please notify us at the above number     Remember:  Do not eat after midnight the night before your surgery  You may drink clear liquids until 7am the morning of your surgery.   Clear liquids allowed are: Water, Non-Citrus Juices (without pulp), Carbonated Beverages, Clear Tea, Black Coffee ONLY (NO MILK, CREAM OR POWDERED CREAMER of any kind), and Gatorade    Take these medicines the morning of surgery with A SIP OF WATER: amLODipine (NORVASC) 5 MG tablet  doxycycline (VIBRA-TABS) 100 MG tablet  levothyroxine (SYNTHROID) 137 MCG tablet  metoprolol succinate (TOPROL-XL) 25 MG 24 hr tablet  timolol (TIMOPTIC) 0.5 % ophthalmic solution  IF NEEDED   Per Doctor Jenkin's order hold your Eliquis 3 days prior to your surgery.  As of today, STOP taking any Aspirin (unless otherwise instructed by your surgeon) Aleve, Naproxen, Ibuprofen, Motrin, Advil, Goody's, BC's, all herbal medications, fish oil, and all vitamins.  WHAT DO I DO ABOUT MY DIABETES MEDICATION?   Do not take oral diabetes medicines (Metformin) the morning of surgery.  J* THE MORNING BEFORE SURGERY you may take your normal Toujeo dose.  THE NIGHT BEFORE SURGERY, take 1/2 (50%) of your Toujeo insulin.      THE MORNING OF SURGERY, take  1/2 (50%) of your Toujeo insulin dose.  The day of surgery, do not take other diabetes injectables, including Byetta (exenatide), Bydureon (exenatide ER), Victoza (liraglutide), or Trulicity  (dulaglutide).  If your CBG is greater than 220 mg/dL, you may take  of your sliding scale (correction) dose of insulin.   HOW TO MANAGE YOUR DIABETES BEFORE AND AFTER SURGERY  Why is it important to control my blood sugar before and after surgery? Improving blood sugar levels before and after surgery helps healing and can limit problems. A way of improving blood sugar control is eating a healthy diet by:  Eating less sugar and carbohydrates  Increasing activity/exercise  Talking with your doctor about reaching your blood sugar goals High blood sugars (greater than 180 mg/dL) can raise your risk of infections and slow your recovery, so you will need to focus on controlling your diabetes during the weeks before surgery. Make sure that the doctor who takes care of your diabetes knows about your planned surgery including the date and location.  How do I manage my blood sugar before surgery? Check your blood sugar at least 4 times a day, starting 2 days before surgery, to make sure that the level is not too high or low.  Check your blood sugar the morning of your surgery when you wake up and every 2 hours until you get to the Short Stay unit.  If your blood sugar is less than 70 mg/dL, you will need to treat for low blood sugar: Do not take insulin. Treat a low blood sugar (less than 70 mg/dL) with  cup of clear juice (cranberry or apple), 4 glucose tablets, OR glucose gel. Recheck blood sugar in 15 minutes after treatment (to make sure  it is greater than 70 mg/dL). If your blood sugar is not greater than 70 mg/dL on recheck, call [phone] for further instructions. Report your blood sugar to the short stay nurse when you get to Short Stay.  If you are admitted to the hospital after surgery: Your blood sugar will be checked by the staff and you will probably be given insulin after surgery (instead of oral diabetes medicines) to make sure you have good blood sugar levels. The goal for  blood sugar control after surgery is 80-180 mg/dL.   DAY OF SURGERY      Do not wear jewelry or makeup. Do not wear lotions, powders, perfumes or deodorant. Do not shave 48 hours prior to surgery.   Do not bring valuables to the hospital. Do not wear nail polish, gel polish, artificial nails, or any other type of covering on natural nails (fingers and toes) If you have artificial nails or gel coating that need to be removed by a nail salon, please have this removed prior to surgery. Artificial nails or gel coating may interfere with anesthesia's ability to adequately monitor your vital signs.  [facility] is not responsible for any belongings or valuables.    Do NOT Smoke (Tobacco/Vaping)  24 hours prior to your procedure  If you use a CPAP at night, you may bring your mask for your overnight stay.   Contacts, glasses, hearing aids, dentures or partials may not be worn into surgery, please bring cases for these belongings   For patients admitted to the hospital, discharge time will be determined by your treatment team.   Patients discharged the day of surgery will not be allowed to drive home, and someone needs to stay with them for 24 hours.   SURGICAL WAITING ROOM VISITATION Patients having surgery or a procedure may have no more than 2 support people in the waiting area - these visitors may rotate.   Children under the age of 59 must have an adult with them who is not the patient. If the patient needs to stay at the hospital during part of their recovery, the visitor guidelines for inpatient rooms apply. Pre-op nurse will coordinate an appropriate time for 1 support person to accompany patient in pre-op.  This support person may not rotate.   Please refer to [email or web address] for the visitor guidelines for Inpatients (after your surgery is over and you are in a regular room).    Special instructions:    Oral Hygiene is also  important to reduce your risk of infection.  Remember - BRUSH YOUR TEETH THE MORNING OF SURGERY WITH YOUR REGULAR TOOTHPASTE   [facility]- Preparing For Surgery  Before surgery, you can play an important role. Because skin is not sterile, your skin needs to be as free of germs as possible. You can reduce the number of germs on your skin by washing with CHG (chlorahexidine gluconate) Soap before surgery.  CHG is an antiseptic cleaner which kills germs and bonds with the skin to continue killing germs even after washing.     Please do not use if you have an allergy to CHG or antibacterial soaps. If your skin becomes reddened/irritated stop using the CHG.  Do not shave (including legs and underarms) for at least 48 hours prior to first CHG shower. It is OK to shave your face.  Please follow these instructions carefully.     Shower the NIGHT BEFORE SURGERY and the MORNING OF SURGERY with CHG Soap.   If you  chose to wash your hair, wash your hair first as usual with your normal shampoo. After you shampoo, rinse your hair and body thoroughly to remove the shampoo.  Then [employer] and genitals (private parts) with your normal soap and rinse thoroughly to remove soap.  After that Use CHG Soap as you would any other liquid soap. You can apply CHG directly to the skin and wash gently with a scrungie or a clean washcloth.   Apply the CHG Soap to your body ONLY FROM THE NECK DOWN.  Do not use on open wounds or open sores. Avoid contact with your eyes, ears, mouth and genitals (private parts). Wash Face and genitals (private parts)  with your normal soap.   Wash thoroughly, paying special attention to the area where your surgery will be performed.  Thoroughly rinse your body with warm water from the neck down.  DO NOT shower/wash with your normal soap after using and rinsing off the CHG Soap.  Pat yourself dry with a CLEAN TOWEL.  Wear CLEAN PAJAMAS to bed the night before surgery  Place CLEAN  SHEETS on your bed the night before your surgery  DO NOT SLEEP WITH PETS.   Day of Surgery:  Take a shower with CHG soap. Wear Clean/Comfortable clothing the morning of surgery Do not apply any deodorants/lotions.   Remember to brush your teeth WITH YOUR REGULAR TOOTHPASTE.    If you received a COVID test during your pre-op visit, it is requested that you wear a mask when out in public, stay away from anyone that may not be feeling well, and notify your surgeon if you develop symptoms. If you have been in contact with anyone that has tested positive in the last 10 days, please notify your surgeon.    Please read over the following fact sheets that you were given.

## 2022-01-18 ENCOUNTER — Encounter (HOSPITAL_COMMUNITY): Payer: Self-pay

## 2022-01-18 ENCOUNTER — Other Ambulatory Visit: Payer: Self-pay

## 2022-01-18 ENCOUNTER — Encounter (HOSPITAL_COMMUNITY)
Admission: RE | Admit: 2022-01-18 | Discharge: 2022-01-18 | Disposition: A | Discharge status: Home / Self Care | Payer: Medicare Other | Source: Ambulatory Visit | Attending: Neurosurgery | Admitting: Neurosurgery

## 2022-01-18 VITALS — BP 172/100 | HR 82 | Temp 98.3°F | Resp 17 | Ht 65.0 in | Wt 202.0 lb

## 2022-01-18 DIAGNOSIS — Z01818 Encounter for other preprocedural examination: Secondary | ICD-10-CM

## 2022-01-18 DIAGNOSIS — T8189XA Other complications of procedures, not elsewhere classified, initial encounter: Secondary | ICD-10-CM | POA: Insufficient documentation

## 2022-01-18 DIAGNOSIS — I129 Hypertensive chronic kidney disease with stage 1 through stage 4 chronic kidney disease, or unspecified chronic kidney disease: Secondary | ICD-10-CM | POA: Diagnosis not present

## 2022-01-18 DIAGNOSIS — I4821 Permanent atrial fibrillation: Secondary | ICD-10-CM | POA: Insufficient documentation

## 2022-01-18 DIAGNOSIS — Z7984 Long term (current) use of oral hypoglycemic drugs: Secondary | ICD-10-CM | POA: Diagnosis not present

## 2022-01-18 DIAGNOSIS — Y838 Other surgical procedures as the cause of abnormal reaction of the patient, or of later complication, without mention of misadventure at the time of the procedure: Secondary | ICD-10-CM | POA: Diagnosis not present

## 2022-01-18 DIAGNOSIS — Z794 Long term (current) use of insulin: Secondary | ICD-10-CM | POA: Insufficient documentation

## 2022-01-18 DIAGNOSIS — E039 Hypothyroidism, unspecified: Secondary | ICD-10-CM | POA: Insufficient documentation

## 2022-01-18 DIAGNOSIS — N183 Chronic kidney disease, stage 3 unspecified: Secondary | ICD-10-CM | POA: Diagnosis not present

## 2022-01-18 DIAGNOSIS — E1122 Type 2 diabetes mellitus with diabetic chronic kidney disease: Secondary | ICD-10-CM | POA: Insufficient documentation

## 2022-01-18 DIAGNOSIS — E0836 Diabetes mellitus due to underlying condition with diabetic cataract: Secondary | ICD-10-CM

## 2022-01-18 DIAGNOSIS — Z01812 Encounter for preprocedural laboratory examination: Secondary | ICD-10-CM | POA: Diagnosis not present

## 2022-01-18 DIAGNOSIS — E785 Hyperlipidemia, unspecified: Secondary | ICD-10-CM | POA: Diagnosis not present

## 2022-01-18 HISTORY — DX: Presence of cardiac pacemaker: Z95.0

## 2022-01-18 LAB — BASIC METABOLIC PANEL
Anion gap: 8 (ref 5–15)
BUN: 45 mg/dL — ABNORMAL HIGH (ref 8–23)
CO2: 23 mmol/L (ref 22–32)
Calcium: 9.6 mg/dL (ref 8.9–10.3)
Chloride: 106 mmol/L (ref 98–111)
Creatinine, Ser: 1.98 mg/dL — ABNORMAL HIGH (ref 0.44–1.00)
GFR, Estimated: 24 mL/min — ABNORMAL LOW (ref >60)
Glucose, Bld: 155 mg/dL — ABNORMAL HIGH (ref 70–99)
Potassium: 5.2 mmol/L — ABNORMAL HIGH (ref 3.5–5.1)
Sodium: 137 mmol/L (ref 135–145)

## 2022-01-18 LAB — GLUCOSE, CAPILLARY: Glucose-Capillary: 143 mg/dL — ABNORMAL HIGH (ref 70–99)

## 2022-01-18 LAB — HEMOGLOBIN A1C
Hgb A1c MFr Bld: 6.9 % — ABNORMAL HIGH (ref 4.8–5.6)
Mean Plasma Glucose: 151.33 mg/dL

## 2022-01-18 LAB — CBC
HCT: 39 % (ref 36.0–46.0)
Hemoglobin: 12.7 g/dL (ref 12.0–15.0)
MCH: 30.6 pg (ref 26.0–34.0)
MCHC: 32.6 g/dL (ref 30.0–36.0)
MCV: 94 fL (ref 80.0–100.0)
Platelets: 322 10*3/uL (ref 150–400)
RBC: 4.15 MIL/uL (ref 3.87–5.11)
RDW: 13.1 % (ref 11.5–15.5)
WBC: 12.1 10*3/uL — ABNORMAL HIGH (ref 4.0–10.5)
nRBC: 0 % (ref 0.0–0.2)

## 2022-01-18 LAB — SURGICAL PCR SCREEN
MRSA, PCR: NEGATIVE
Staphylococcus aureus: NEGATIVE

## 2022-01-18 NOTE — Progress Notes (Signed)
Anesthesia Chart Review:  Case: [id number] Date/Time: 01/19/22 0943   Procedure: REVISION OF LUMBAR WOUND - 3C   Anesthesia type: General   Pre-op diagnosis: WOUND DRAINAGE   Location: [facility] OR ROOM 34 / [facility] OR   Surgeons: Newman Pies, MD       DISCUSSION: Patient is an 87 year old female scheduled for the above procedure.   History includes never smoker, HTN, HLD, afib (s/p DCCV 02/27/11; recurrent afib 06/2017), bradycardia/high grade AVB (s/p dual chamber Medtronic PPM 12/02/18), left breast cancer (s/p left breast lumpectomy 01/19/16, declined radiation; on anastrozole), DM2, dyspnea, hypothyroidism, CKD (stage III), cholecystectomy (2006), spinal surgery (L3-5 PLIF 11/17/20, s/p I&D of wound with wound VAC placement 12/20/20).   Last cardiology visit was on 12/30/21 with Dr. Lovena Le. He last saw her on 12/30/21. Normal PPM function. She was pacing about 99% of the time. Afib remained rate controlled. Diastolic CHF symptoms remained at class 2, current medications continued. 1 year follow-up recommended. She had primary cardiology follow-up with Christen Bame, NP on 12/06/21 with six month follow-up planned.   EP Perioperative PPM Rx: Device Information: Clinic EP Physician:  Cristopher Peru, MD  Device Type:  Pacemaker Manufacturer and Phone #:  Medtronic: [phone] Pacemaker Dependent?:  Yes.   Date of Last Device Check:  01/04/2022     Normal Device Function?:  Yes.     Electrophysiologist's Recommendations: Have magnet available. Provide continuous ECG monitoring when magnet is used or reprogramming is to be performed.  Procedure will likely interfere with device function.  Device should be programmed:  Asynchronous pacing during procedure and returned to normal programming after procedure    PAT was on 01/18/22. Labs showed K 5.2 (no mention of hemolysis). BUN 45, Creatinine 1.98, eGFR 24. WBC 12.1. Known CKD. Her Creatinine has ranged from 1.36-1.75 from 11/2020 - 12/2020 in [facility].  Cr 1.8, BUN 37, eGFR 26.7 on 04/19/21 and Cr 1.7, BUN 31, eGFR 28.6 on 10/26/21 at [facility] (See Care Everywhere).   Anesthesia team to evaluate on the day of surgery.   She reported instructions for last Eliquis dose 01/25/22.   VS: BP (!) 172/100   Pulse 82   Temp 36.8 C   Resp 17   Ht _0  (1.651 m)   Wt 91.6 kg   LMP  (LMP Unknown)   SpO2 99%   BMI 33.61 kg/m  BP Readings from Last 3 Encounters:  01/18/22 (!) 172/100  12/30/21 132/78  12/06/21 132/60     PROVIDERS: Haywood Pao, MD is PCP  Lauree Chandler, MD is cardiologist Cristopher Peru, MD is EP cardiologist Nicholas Lose, MD is HEM-ONC   LABS: Preoperative labs from 01/18/22 noted. K 5.2 (no mention of hemolysis). BUN 45, Creatinine 1.98, eGFR 24. WBC 12.1. See DISCUSSION. (all labs ordered are listed, but only abnormal results are displayed)  Labs Reviewed  GLUCOSE, CAPILLARY - Abnormal; Notable for the following components:      Result Value   Glucose-Capillary 143 (*)    All other components within normal limits  BASIC METABOLIC PANEL - Abnormal; Notable for the following components:   Potassium 5.2 (*)    Glucose, Bld 155 (*)    BUN 45 (*)    Creatinine, Ser 1.98 (*)    GFR, Estimated 24 (*)    All other components within normal limits  HEMOGLOBIN A1C - Abnormal; Notable for the following components:   Hgb A1c MFr Bld 6.9 (*)    All other components  within normal limits  CBC - Abnormal; Notable for the following components:   WBC 12.1 (*)    All other components within normal limits  SURGICAL PCR SCREEN     IMAGES: MRI L-spine 01/04/22: IMPRESSION: 1. Postsurgical changes at the L3-5 level with prominent enhancing scar tissue in the subcutaneous without evidence of a well-defined drainable collection. 2. Interval progression of degenerative changes at L2-3 now with moderate spinal canal stenosis, moderate narrowing of the bilateral subarticular zones and mild  bilateral neural foraminal narrowing. 3. Small right central disc protrusion at L5-S1 resulting in mild narrowing of the right subarticular zone and abutting the traversing right S1 nerve root.   EKG: V-paced rhythm with underlying permanent afib at 73 bpm   CV: US Carotid 11/27/18: Summary:  - Right Carotid: Velocities in the right ICA are consistent with a 1-39% stenosis.  - Left Carotid: Velocities in the left ICA are consistent with a 1-39% stenosis.  - Vertebrals:  Bilateral vertebral arteries demonstrate antegrade flow.  - Subclavians: Normal flow hemodynamics were seen in bilateral subclavian arteries.      Echo 07/26/17: Study Conclusions  - Left ventricle: The cavity size was normal. Wall thickness was    increased in a pattern of mild LVH. Systolic function was normal.    The estimated ejection fraction was in the range of 55% to 60%.    Wall motion was normal; there were no regional wall motion    abnormalities.  - Mitral valve: Mildly to moderately calcified annulus. Mildly    thickened leaflets .  - Left atrium: The atrium was mildly dilated.  - Pulmonary arteries: Systolic pressure was mildly increased. PA    peak pressure: 36 mm Hg (S).      Nuclear stress test 01/05/11: Overall Impression:   Low risk study.  Small area of apical reversibility seen only on short axis and small area of midanterior wall reversibility seen only on vertical images.  This suggests artifact.  SDS 1 and normal wall motion    Past Medical History:  Diagnosis Date   Atrial fibrillation ([place])    Breast cancer ([place])    Breast cancer, left ([place])    Chronic kidney disease    stage III - patient was unaware   Diabetes mellitus    Dyspnea    Dysrhythmia    Afib   H/O: hysterectomy    History of colonoscopy 01/25/2010   History of mammogram 08/04/2009   Hyperlipidemia    Hypertension    Hypothyroidism    Ketoacidosis, diabetic, no coma, non-insulin dependent    Type II   Presence  of permanent cardiac pacemaker    Vitamin B12 deficiency     Past Surgical History:  Procedure Laterality Date   ABDOMINAL HYSTERECTOMY     BACK SURGERY     Bilateral foot surgery     BREAST EXCISIONAL BIOPSY Left 12/2015   BREAST LUMPECTOMY Left    2017   BREAST LUMPECTOMY WITH RADIOACTIVE SEED AND SENTINEL LYMPH NODE BIOPSY Left 01/19/2016   Procedure: LEFT BREAST LUMPECTOMY WITH RADIOACTIVE SEED AND SENTINEL LYMPH NODE BIOPSY;  Surgeon: Autumn Messing III, MD;  Location: [facility];  Service: General;  Laterality: Left;   BREAST LUMPECTOMY WITH RADIOACTIVE SEED LOCALIZATION Left 01/19/2016   CARDIOVERSION  02/27/2011   Procedure: CARDIOVERSION;  Surgeon: Loralie Champagne, MD;  Location: [facility];  Service: Cardiovascular;  Laterality: N/A;   INSERT / REPLACE / REMOVE PACEMAKER     LAPAROSCOPIC CHOLECYSTECTOMY  LUMBAR WOUND DEBRIDEMENT N/A 12/20/2020   Procedure: LUMBAR WOUND DEBRIDEMENT WITH PLACEMENTOF LUMBAR WOUND VAC;  Surgeon: Newman Pies, MD;  Location: [facility];  Service: Neurosurgery;  Laterality: N/A;   PACEMAKER IMPLANT N/A 12/02/2018   Procedure: PACEMAKER IMPLANT;  Surgeon: Evans Lance, MD;  Location: [facility] CV LAB;  Service: Cardiovascular;  Laterality: N/A;   ROTATOR CUFF REPAIR Left    TUBAL LIGATION      MEDICATIONS:  amLODipine (NORVASC) 5 MG tablet   Cyanocobalamin (B-12 PO)   doxycycline (VIBRA-TABS) 100 MG tablet   ELIQUIS 5 MG TABS tablet   ferrous sulfate 325 (65 FE) MG tablet   insulin aspart (NOVOLOG) 100 UNIT/ML injection   irbesartan (AVAPRO) 300 MG tablet   levothyroxine (SYNTHROID) 137 MCG tablet   metFORMIN (GLUCOPHAGE) 850 MG tablet   metoprolol succinate (TOPROL-XL) 25 MG 24 hr tablet   Multiple Vitamins-Minerals (MULTIVITAMIN WITH MINERALS) tablet   spironolactone (ALDACTONE) 25 MG tablet   timolol (TIMOPTIC) 0.5 % ophthalmic solution   TOUJEO SOLOSTAR 300 UNIT/ML Solostar Pen   No current facility-administered medications for this encounter.     Myra Gianotti, PA-C Surgical Short Stay/Anesthesiology [facility] Phone [phone] [facility] Phone [phone] 01/18/2022 3:33 PM

## 2022-01-18 NOTE — Progress Notes (Signed)
PCP -Domenick Gong MD Cardiologist - Gennette Pac  PPM/ICD - Medtronic pacemaker Device Orders - received Rep Notified -   Chest x-ray - 12/03/18 EKG - 12/06/21 Stress Test - 12/07/10 ECHO - 07/26/17 Cardiac Cath - none  Sleep Study - denies CPAP -   Fasting Blood Sugar - 120-150 Checks Blood Sugar- has continuous blood sugar monitor on right arm  Last dose of GLP1 agonist-  na GLP1 instructions: na  Blood Thinner Instructions:pt states she was instructed to take her last dose of Eliquis on 11/5. Aspirin Instructions:  ERAS Protcol - clear liquids until 0700 PRE-SURGERY Ensure or G2- no  COVID TEST- na   Anesthesia review: yes- cardiac history-pacemaker  Patient denies shortness of breath, fever, cough and chest pain at PAT appointment   All instructions explained to the patient, with a verbal understanding of the material. Patient agrees to go over the instructions while at home for a better understanding. Patient also instructed to wear a mask when out in public prior to surgery. The opportunity to ask questions was provided.

## 2022-01-18 NOTE — Anesthesia Preprocedure Evaluation (Signed)
Anesthesia Evaluation  Patient identified by MRN, date of birth, ID band Patient awake    Reviewed: Allergy & Precautions, NPO status , Patient's Chart, lab work & pertinent test results, reviewed documented beta blocker date and time   History of Anesthesia Complications Negative for: history of anesthetic complications  Airway Mallampati: II  TM Distance: >3 FB Neck ROM: Full    Dental  (+) Edentulous Upper, Dental Advisory Given   Pulmonary neg pulmonary ROS   breath sounds clear to auscultation       Cardiovascular hypertension, Pt. on medications and Pt. on home beta blockers +CHF  + dysrhythmias Atrial Fibrillation + pacemaker  Rhythm:Regular Rate:Normal  '19 ECHO: EF 55-60%, normal LVF, no significant valvular abnormalities   Neuro/Psych negative neurological ROS     GI/Hepatic negative GI ROS, Neg liver ROS,,,  Endo/Other  diabetes (glu 175: pt took insulin last night), Oral Hypoglycemic Agents, Insulin DependentHypothyroidism    Renal/GU Renal InsufficiencyRenal disease     Musculoskeletal  (+) Arthritis ,    Abdominal  (+) + obese  Peds  Hematology eliquis   Anesthesia Other Findings Breast cancer  Reproductive/Obstetrics                             Anesthesia Physical Anesthesia Plan  ASA: 3  Anesthesia Plan: General   Post-op Pain Management: Tylenol PO (pre-op)*   Induction: Intravenous  PONV Risk Score and Plan: 3 and Ondansetron and Dexamethasone  Airway Management Planned: Oral ETT  Additional Equipment: None  Intra-op Plan:   Post-operative Plan: Extubation in OR  Informed Consent: I have reviewed the patients History and Physical, chart, labs and discussed the procedure including the risks, benefits and alternatives for the proposed anesthesia with the patient or authorized representative who has indicated his/her understanding and acceptance.     Dental  advisory given  Plan Discussed with: CRNA and Surgeon  Anesthesia Plan Comments: (PAT note written 01/18/2022 by Myra Gianotti, PA-C.  )       Anesthesia Quick Evaluation

## 2022-01-19 ENCOUNTER — Inpatient Hospital Stay (HOSPITAL_BASED_OUTPATIENT_CLINIC_OR_DEPARTMENT_OTHER): Payer: Medicare Other | Admitting: Anesthesiology

## 2022-01-19 ENCOUNTER — Other Ambulatory Visit: Payer: Self-pay

## 2022-01-19 ENCOUNTER — Encounter (HOSPITAL_COMMUNITY): Payer: Self-pay | Admitting: Neurosurgery

## 2022-01-19 ENCOUNTER — Observation Stay (HOSPITAL_COMMUNITY)
Admission: RE | Admit: 2022-01-19 | Discharge: 2022-01-20 | Disposition: A | Discharge status: Home / Self Care | Payer: Medicare Other | Attending: Neurosurgery | Admitting: Neurosurgery

## 2022-01-19 ENCOUNTER — Inpatient Hospital Stay (HOSPITAL_COMMUNITY): Payer: Medicare Other | Admitting: Vascular Surgery

## 2022-01-19 ENCOUNTER — Encounter (HOSPITAL_COMMUNITY)
Admission: RE | Disposition: A | Discharge status: Home / Self Care | Payer: Self-pay | Source: Home / Self Care | Attending: Neurosurgery

## 2022-01-19 DIAGNOSIS — T8142XA Infection following a procedure, deep incisional surgical site, initial encounter: Secondary | ICD-10-CM | POA: Diagnosis not present

## 2022-01-19 DIAGNOSIS — Z950 Presence of cardiac pacemaker: Secondary | ICD-10-CM | POA: Diagnosis not present

## 2022-01-19 DIAGNOSIS — E039 Hypothyroidism, unspecified: Secondary | ICD-10-CM | POA: Diagnosis not present

## 2022-01-19 DIAGNOSIS — Y792 Prosthetic and other implants, materials and accessory orthopedic devices associated with adverse incidents: Secondary | ICD-10-CM | POA: Insufficient documentation

## 2022-01-19 DIAGNOSIS — Z01818 Encounter for other preprocedural examination: Secondary | ICD-10-CM

## 2022-01-19 DIAGNOSIS — Z7984 Long term (current) use of oral hypoglycemic drugs: Secondary | ICD-10-CM | POA: Diagnosis not present

## 2022-01-19 DIAGNOSIS — I129 Hypertensive chronic kidney disease with stage 1 through stage 4 chronic kidney disease, or unspecified chronic kidney disease: Secondary | ICD-10-CM | POA: Diagnosis not present

## 2022-01-19 DIAGNOSIS — S31000A Unspecified open wound of lower back and pelvis without penetration into retroperitoneum, initial encounter: Principal | ICD-10-CM | POA: Diagnosis present

## 2022-01-19 DIAGNOSIS — E119 Type 2 diabetes mellitus without complications: Secondary | ICD-10-CM | POA: Insufficient documentation

## 2022-01-19 DIAGNOSIS — Z79899 Other long term (current) drug therapy: Secondary | ICD-10-CM | POA: Diagnosis not present

## 2022-01-19 DIAGNOSIS — Z794 Long term (current) use of insulin: Secondary | ICD-10-CM | POA: Insufficient documentation

## 2022-01-19 DIAGNOSIS — T8189XA Other complications of procedures, not elsewhere classified, initial encounter: Secondary | ICD-10-CM

## 2022-01-19 DIAGNOSIS — I509 Heart failure, unspecified: Secondary | ICD-10-CM | POA: Diagnosis not present

## 2022-01-19 DIAGNOSIS — I110 Hypertensive heart disease with heart failure: Secondary | ICD-10-CM | POA: Diagnosis not present

## 2022-01-19 DIAGNOSIS — Z853 Personal history of malignant neoplasm of breast: Secondary | ICD-10-CM | POA: Insufficient documentation

## 2022-01-19 DIAGNOSIS — N289 Disorder of kidney and ureter, unspecified: Secondary | ICD-10-CM | POA: Diagnosis not present

## 2022-01-19 DIAGNOSIS — T8141XA Infection following a procedure, superficial incisional surgical site, initial encounter: Secondary | ICD-10-CM | POA: Diagnosis not present

## 2022-01-19 DIAGNOSIS — N1831 Chronic kidney disease, stage 3a: Secondary | ICD-10-CM | POA: Insufficient documentation

## 2022-01-19 DIAGNOSIS — Z7901 Long term (current) use of anticoagulants: Secondary | ICD-10-CM | POA: Diagnosis not present

## 2022-01-19 HISTORY — PX: WOUND EXPLORATION: SHX6188

## 2022-01-19 LAB — GLUCOSE, CAPILLARY
Glucose-Capillary: 173 mg/dL — ABNORMAL HIGH (ref 70–99)
Glucose-Capillary: 175 mg/dL — ABNORMAL HIGH (ref 70–99)
Glucose-Capillary: 196 mg/dL — ABNORMAL HIGH (ref 70–99)
Glucose-Capillary: 140 mg/dL — ABNORMAL HIGH (ref 70–99)
Glucose-Capillary: 123 mg/dL — ABNORMAL HIGH (ref 70–99)
Glucose-Capillary: 171 mg/dL — ABNORMAL HIGH (ref 70–99)

## 2022-01-19 SURGERY — WOUND EXPLORATION
Anesthesia: General | Site: Back

## 2022-01-19 MED ORDER — ACETAMINOPHEN 500 MG PO TABS
1000.0000 mg | ORAL_TABLET | Freq: Once | ORAL | Status: AC
  Administered 2022-01-19: 1000 mg via ORAL
  Filled 2022-01-19: qty 2

## 2022-01-19 MED ORDER — ROCURONIUM BROMIDE 10 MG/ML (PF) SYRINGE
PREFILLED_SYRINGE | INTRAVENOUS | Status: DC | PRN
  Administered 2022-01-19: 60 mg via INTRAVENOUS

## 2022-01-19 MED ORDER — PROPOFOL 10 MG/ML IV BOLUS
INTRAVENOUS | Status: AC
  Filled 2022-01-19: qty 20

## 2022-01-19 MED ORDER — LIDOCAINE 2% (20 MG/ML) 5 ML SYRINGE
INTRAMUSCULAR | Status: DC | PRN
  Administered 2022-01-19: 60 mg via INTRAVENOUS

## 2022-01-19 MED ORDER — HYDRALAZINE HCL 20 MG/ML IJ SOLN: 10.0000 mg | INTRAMUSCULAR | Status: DC | PRN

## 2022-01-19 MED ORDER — PROMETHAZINE HCL 25 MG/ML IJ SOLN: 6.2500 mg | INTRAMUSCULAR | Status: DC | PRN

## 2022-01-19 MED ORDER — ACETAMINOPHEN 650 MG RE SUPP: 650.0000 mg | Freq: Four times a day (QID) | RECTAL | Status: DC | PRN

## 2022-01-19 MED ORDER — OXYCODONE HCL 5 MG PO TABS: 5.0000 mg | ORAL_TABLET | Freq: Once | ORAL | Status: DC | PRN

## 2022-01-19 MED ORDER — INSULIN GLARGINE 100 UNIT/ML ~~LOC~~ SOLN
10.0000 [IU] | Freq: Every day | SUBCUTANEOUS | Status: DC
  Administered 2022-01-19: 10 [IU] via SUBCUTANEOUS
  Filled 2022-01-19 (×2): qty 0.1

## 2022-01-19 MED ORDER — METOPROLOL SUCCINATE ER 25 MG PO TB24
25.0000 mg | ORAL_TABLET | Freq: Every day | ORAL | Status: DC
  Administered 2022-01-20: 25 mg via ORAL
  Filled 2022-01-19: qty 1

## 2022-01-19 MED ORDER — LEVOTHYROXINE SODIUM 25 MCG PO TABS
137.0000 ug | ORAL_TABLET | Freq: Every day | ORAL | Status: DC
  Administered 2022-01-20: 137 ug via ORAL
  Filled 2022-01-19: qty 1

## 2022-01-19 MED ORDER — ONDANSETRON HCL 4 MG/2ML IJ SOLN: 4.0000 mg | Freq: Four times a day (QID) | INTRAMUSCULAR | Status: DC | PRN

## 2022-01-19 MED ORDER — VANCOMYCIN HCL IN DEXTROSE 1-5 GM/200ML-% IV SOLN
INTRAVENOUS | Status: AC
  Administered 2022-01-19: 1000 mg via INTRAVENOUS
  Filled 2022-01-19: qty 200

## 2022-01-19 MED ORDER — PHENYLEPHRINE HCL-NACL 20-0.9 MG/250ML-% IV SOLN
INTRAVENOUS | Status: DC | PRN
  Administered 2022-01-19: 50 ug/min via INTRAVENOUS
  Administered 2022-01-19: 75 ug/min via INTRAVENOUS

## 2022-01-19 MED ORDER — INSULIN GLARGINE-YFGN 100 UNIT/ML ~~LOC~~ SOLN
15.0000 [IU] | Freq: Every morning | SUBCUTANEOUS | Status: DC
  Administered 2022-01-20: 15 [IU] via SUBCUTANEOUS
  Filled 2022-01-19: qty 0.15

## 2022-01-19 MED ORDER — CHLORHEXIDINE GLUCONATE 0.12 % MT SOLN: 15.0000 mL | Freq: Once | OROMUCOSAL | Status: AC

## 2022-01-19 MED ORDER — CHLORHEXIDINE GLUCONATE 0.12 % MT SOLN
OROMUCOSAL | Status: AC
  Administered 2022-01-19: 15 mL via OROMUCOSAL
  Filled 2022-01-19: qty 15

## 2022-01-19 MED ORDER — MIDAZOLAM HCL 2 MG/2ML IJ SOLN: 0.5000 mg | Freq: Once | INTRAMUSCULAR | Status: DC | PRN

## 2022-01-19 MED ORDER — FENTANYL CITRATE (PF) 100 MCG/2ML IJ SOLN
INTRAMUSCULAR | Status: AC
  Filled 2022-01-19: qty 2

## 2022-01-19 MED ORDER — CHLORHEXIDINE GLUCONATE CLOTH 2 % EX PADS: 6.0000 | MEDICATED_PAD | Freq: Once | CUTANEOUS | Status: DC

## 2022-01-19 MED ORDER — FENTANYL CITRATE (PF) 250 MCG/5ML IJ SOLN
INTRAMUSCULAR | Status: DC | PRN
  Administered 2022-01-19: 100 ug via INTRAVENOUS
  Administered 2022-01-19: 50 ug via INTRAVENOUS

## 2022-01-19 MED ORDER — ACETAMINOPHEN 325 MG PO TABS: 650.0000 mg | ORAL_TABLET | Freq: Four times a day (QID) | ORAL | Status: DC | PRN

## 2022-01-19 MED ORDER — POLYETHYLENE GLYCOL 3350 17 G PO PACK: 17.0000 g | PACK | Freq: Every day | ORAL | Status: DC | PRN

## 2022-01-19 MED ORDER — ORAL CARE MOUTH RINSE: 15.0000 mL | Freq: Once | OROMUCOSAL | Status: AC

## 2022-01-19 MED ORDER — TIMOLOL MALEATE 0.5 % OP SOLN
1.0000 [drp] | Freq: Every morning | OPHTHALMIC | Status: DC
  Filled 2022-01-19: qty 5

## 2022-01-19 MED ORDER — LACTATED RINGERS IV SOLN: INTRAVENOUS | Status: DC

## 2022-01-19 MED ORDER — SUGAMMADEX SODIUM 200 MG/2ML IV SOLN
INTRAVENOUS | Status: DC | PRN
  Administered 2022-01-19: 200 mg via INTRAVENOUS

## 2022-01-19 MED ORDER — OXYCODONE HCL 5 MG PO TABS
5.0000 mg | ORAL_TABLET | ORAL | Status: DC | PRN
  Administered 2022-01-19: 5 mg via ORAL
  Filled 2022-01-19: qty 1

## 2022-01-19 MED ORDER — PROPOFOL 10 MG/ML IV BOLUS
INTRAVENOUS | Status: DC | PRN
  Administered 2022-01-19: 100 mg via INTRAVENOUS

## 2022-01-19 MED ORDER — FERROUS SULFATE 325 (65 FE) MG PO TABS
325.0000 mg | ORAL_TABLET | Freq: Every day | ORAL | Status: DC
  Administered 2022-01-20: 325 mg via ORAL
  Filled 2022-01-19: qty 1

## 2022-01-19 MED ORDER — FENTANYL CITRATE (PF) 100 MCG/2ML IJ SOLN
25.0000 ug | INTRAMUSCULAR | Status: DC | PRN
  Administered 2022-01-19 (×2): 50 ug via INTRAVENOUS

## 2022-01-19 MED ORDER — ONDANSETRON HCL 4 MG PO TABS: 4.0000 mg | ORAL_TABLET | Freq: Four times a day (QID) | ORAL | Status: DC | PRN

## 2022-01-19 MED ORDER — ADULT MULTIVITAMIN W/MINERALS CH
1.0000 | ORAL_TABLET | Freq: Every morning | ORAL | Status: DC
  Administered 2022-01-20: 1 via ORAL
  Filled 2022-01-19: qty 1

## 2022-01-19 MED ORDER — VANCOMYCIN HCL IN DEXTROSE 1-5 GM/200ML-% IV SOLN: 1000.0000 mg | INTRAVENOUS | Status: AC

## 2022-01-19 MED ORDER — INSULIN ASPART 100 UNIT/ML IJ SOLN
0.0000 [IU] | Freq: Three times a day (TID) | INTRAMUSCULAR | Status: DC
  Administered 2022-01-20: 5 [IU] via SUBCUTANEOUS

## 2022-01-19 MED ORDER — BACITRACIN ZINC 500 UNIT/GM EX OINT
TOPICAL_OINTMENT | CUTANEOUS | Status: DC | PRN
  Administered 2022-01-19: 1 via TOPICAL

## 2022-01-19 MED ORDER — METFORMIN HCL 850 MG PO TABS: 850.0000 mg | ORAL_TABLET | Freq: Two times a day (BID) | ORAL | Status: DC

## 2022-01-19 MED ORDER — FENTANYL CITRATE (PF) 250 MCG/5ML IJ SOLN
INTRAMUSCULAR | Status: AC
  Filled 2022-01-19: qty 5

## 2022-01-19 MED ORDER — OXYCODONE HCL 5 MG/5ML PO SOLN: 5.0000 mg | Freq: Once | ORAL | Status: DC | PRN

## 2022-01-19 MED ORDER — DOXYCYCLINE HYCLATE 100 MG PO TABS
100.0000 mg | ORAL_TABLET | Freq: Every day | ORAL | Status: DC
  Administered 2022-01-20: 100 mg via ORAL
  Filled 2022-01-19: qty 1

## 2022-01-19 MED ORDER — SPIRONOLACTONE 25 MG PO TABS
25.0000 mg | ORAL_TABLET | Freq: Every morning | ORAL | Status: DC
  Administered 2022-01-20: 25 mg via ORAL
  Filled 2022-01-19: qty 1

## 2022-01-19 MED ORDER — DEXAMETHASONE SODIUM PHOSPHATE 10 MG/ML IJ SOLN
INTRAMUSCULAR | Status: DC | PRN
  Administered 2022-01-19: 4 mg via INTRAVENOUS

## 2022-01-19 MED ORDER — IRBESARTAN 300 MG PO TABS
300.0000 mg | ORAL_TABLET | Freq: Every day | ORAL | Status: DC
  Administered 2022-01-20: 300 mg via ORAL
  Filled 2022-01-19 (×2): qty 1

## 2022-01-19 MED ORDER — 0.9 % SODIUM CHLORIDE (POUR BTL) OPTIME
TOPICAL | Status: DC | PRN
  Administered 2022-01-19: 1000 mL

## 2022-01-19 MED ORDER — METHOCARBAMOL 1000 MG/10ML IJ SOLN: 500.0000 mg | Freq: Four times a day (QID) | INTRAVENOUS | Status: DC | PRN

## 2022-01-19 MED ORDER — HYDROMORPHONE HCL 1 MG/ML IJ SOLN
0.5000 mg | INTRAMUSCULAR | Status: DC | PRN
  Administered 2022-01-19: 1 mg via INTRAVENOUS
  Filled 2022-01-19: qty 1

## 2022-01-19 MED ORDER — PHENYLEPHRINE 80 MCG/ML (10ML) SYRINGE FOR IV PUSH (FOR BLOOD PRESSURE SUPPORT)
PREFILLED_SYRINGE | INTRAVENOUS | Status: DC | PRN
  Administered 2022-01-19: 160 ug via INTRAVENOUS
  Administered 2022-01-19: 80 ug via INTRAVENOUS

## 2022-01-19 MED ORDER — ONDANSETRON HCL 4 MG/2ML IJ SOLN
INTRAMUSCULAR | Status: DC | PRN
  Administered 2022-01-19: 4 mg via INTRAVENOUS

## 2022-01-19 MED ORDER — AMLODIPINE BESYLATE 5 MG PO TABS
5.0000 mg | ORAL_TABLET | Freq: Every morning | ORAL | Status: DC
  Administered 2022-01-20: 5 mg via ORAL
  Filled 2022-01-19: qty 1

## 2022-01-19 MED ORDER — DOCUSATE SODIUM 100 MG PO CAPS
100.0000 mg | ORAL_CAPSULE | Freq: Two times a day (BID) | ORAL | Status: DC
  Administered 2022-01-19 – 2022-01-20 (×2): 100 mg via ORAL
  Filled 2022-01-19 (×2): qty 1

## 2022-01-19 SURGICAL SUPPLY — 37 items
APL SKNCLS STERI-STRIP NONHPOA (GAUZE/BANDAGES/DRESSINGS) ×1
BAG COUNTER SPONGE SURGICOUNT (BAG) ×1 IMPLANT
BAG SPNG CNTER NS LX DISP (BAG) ×1
BENZOIN TINCTURE PRP APPL 2/3 (GAUZE/BANDAGES/DRESSINGS) ×1 IMPLANT
BLADE CLIPPER SURG (BLADE) IMPLANT
CANISTER SUCT 3000ML PPV (MISCELLANEOUS) ×1 IMPLANT
DRAPE LAPAROTOMY 100X72 PEDS (DRAPES) IMPLANT
DRAPE LAPAROTOMY 100X72X124 (DRAPES) IMPLANT
DRAPE SURG 17X23 STRL (DRAPES) ×4 IMPLANT
ELECT REM PT RETURN 9FT ADLT (ELECTROSURGICAL) ×1
ELECTRODE REM PT RTRN 9FT ADLT (ELECTROSURGICAL) ×1 IMPLANT
GAUZE 4X4 16PLY ~~LOC~~+RFID DBL (SPONGE) IMPLANT
GAUZE SPONGE 4X4 12PLY STRL (GAUZE/BANDAGES/DRESSINGS) ×1 IMPLANT
GLOVE BIO SURGEON STRL SZ 6.5 (GLOVE) ×1 IMPLANT
GLOVE BIO SURGEON STRL SZ8 (GLOVE) ×1 IMPLANT
GLOVE BIO SURGEON STRL SZ8.5 (GLOVE) ×1 IMPLANT
GLOVE BIOGEL PI IND STRL 6.5 (GLOVE) ×1 IMPLANT
GLOVE EXAM NITRILE XL STR (GLOVE) IMPLANT
GOWN STRL REUS W/ TWL LRG LVL3 (GOWN DISPOSABLE) IMPLANT
GOWN STRL REUS W/ TWL XL LVL3 (GOWN DISPOSABLE) IMPLANT
GOWN STRL REUS W/TWL LRG LVL3 (GOWN DISPOSABLE)
GOWN STRL REUS W/TWL XL LVL3 (GOWN DISPOSABLE)
KIT BASIN OR (CUSTOM PROCEDURE TRAY) ×1 IMPLANT
KIT TURNOVER KIT B (KITS) ×1 IMPLANT
NEEDLE HYPO 22GX1.5 SAFETY (NEEDLE) IMPLANT
NS IRRIG 1000ML POUR BTL (IV SOLUTION) ×1 IMPLANT
PACK LAMINECTOMY NEURO (CUSTOM PROCEDURE TRAY) ×1 IMPLANT
PAD ARMBOARD 7.5X6 YLW CONV (MISCELLANEOUS) ×3 IMPLANT
STRIP CLOSURE SKIN 1/2X4 (GAUZE/BANDAGES/DRESSINGS) ×1 IMPLANT
SUT VIC AB 1 CT1 18XBRD ANBCTR (SUTURE) ×1 IMPLANT
SUT VIC AB 1 CT1 8-18 (SUTURE) ×1
SUT VIC AB 2-0 CP2 18 (SUTURE) ×1 IMPLANT
SWAB COLLECTION DEVICE MRSA (MISCELLANEOUS) IMPLANT
SWAB CULTURE ESWAB REG 1ML (MISCELLANEOUS) IMPLANT
TOWEL GREEN STERILE (TOWEL DISPOSABLE) ×1 IMPLANT
TOWEL GREEN STERILE FF (TOWEL DISPOSABLE) ×1 IMPLANT
WATER STERILE IRR 1000ML POUR (IV SOLUTION) ×1 IMPLANT

## 2022-01-19 NOTE — H&P (Signed)
Subjective: The patient is a 87 year old white female on whom I performed a lumbar decompression, instrumentation and fusion from the last year.  She developed a wound infection and had an incision and drainage and placement of wound VAC in October 2022.  She went on to heal.  She has developed a persistent superficial area which has not healed and continues to drain small amounts.  I worked up with a lumbar MRI which did not demonstrate any deeper infection.  We discussed the various treatment options and she has decided to proceed with a fusion of her lumbar wound.  Past Medical History:  Diagnosis Date   Atrial fibrillation ([place])    Breast cancer ([place])    Breast cancer, left ([place])    Chronic kidney disease    stage III - patient was unaware   Diabetes mellitus    Dyspnea    Dysrhythmia    Afib   H/O: hysterectomy    History of colonoscopy 01/25/2010   History of mammogram 08/04/2009   Hyperlipidemia    Hypertension    Hypothyroidism    Ketoacidosis, diabetic, no coma, non-insulin dependent    Type II   Presence of permanent cardiac pacemaker    Vitamin B12 deficiency     Past Surgical History:  Procedure Laterality Date   ABDOMINAL HYSTERECTOMY     BACK SURGERY     Bilateral foot surgery     BREAST EXCISIONAL BIOPSY Left 12/2015   BREAST LUMPECTOMY Left    2017   BREAST LUMPECTOMY WITH RADIOACTIVE SEED AND SENTINEL LYMPH NODE BIOPSY Left 01/19/2016   Procedure: LEFT BREAST LUMPECTOMY WITH RADIOACTIVE SEED AND SENTINEL LYMPH NODE BIOPSY;  Surgeon: Autumn Messing III, MD;  Location: [facility] [facility];  Service: General;  Laterality: Left;   BREAST LUMPECTOMY WITH RADIOACTIVE SEED LOCALIZATION Left 01/19/2016   CARDIOVERSION  02/27/2011   Procedure: CARDIOVERSION;  Surgeon: Loralie Champagne, MD;  Location: [facility];  Service: Cardiovascular;  Laterality: N/A;   INSERT / REPLACE / REMOVE PACEMAKER     LAPAROSCOPIC CHOLECYSTECTOMY     LUMBAR WOUND DEBRIDEMENT N/A 12/20/2020   Procedure: LUMBAR WOUND  DEBRIDEMENT WITH PLACEMENTOF LUMBAR WOUND Wilson;  Surgeon: Newman Pies, MD;  Location: [facility];  Service: Neurosurgery;  Laterality: N/A;   PACEMAKER IMPLANT N/A 12/02/2018   Procedure: PACEMAKER IMPLANT;  Surgeon: Evans Lance, MD;  Location: [facility];  Service: Cardiovascular;  Laterality: N/A;   ROTATOR CUFF REPAIR Left    TUBAL LIGATION      Allergies  Allergen Reactions   Bee Venom Shortness Of Breath, Nausea And Vomiting and Other (See Comments)    Makes the patient feel faint, also   Penicillins Anaphylaxis, Hives, Swelling and Other (See Comments)    Has patient had a PCN reaction causing immediate rash, facial/tongue/throat swelling, SOB or lightheadedness with hypotension: Yes Has patient had a PCN reaction causing severe rash involving mucus membranes or skin necrosis: No Has patient had a PCN reaction that required hospitalization: No Has patient had a PCN reaction occurring within the last 10 years: No If all of the above answers are "NO", then may proceed with Cephalosporin use.    Lisinopril Cough   Zocor [Simvastatin] Other (See Comments)    memory changes   Liraglutide Rash and Other (See Comments)    Rash at injection site    Social History   Tobacco Use   Smoking status: Never   Smokeless tobacco: Never  Substance Use Topics   Alcohol use:  No    Alcohol/week: 0.0 standard drinks of alcohol    Family History  Problem Relation Age of Onset   Heart failure Father 77       enlarged heart   Pneumonia Mother 57   Diabetes Mother 3   Breast cancer Sister    Cancer Neg Hx    Prior to Admission medications   Medication Sig Start Date End Date Taking? Authorizing Provider  amLODipine (NORVASC) 5 MG tablet Take 5 mg by mouth in the morning. 04/29/20  Yes [provider]  Cyanocobalamin (B-12 PO) Take 1 tablet by mouth daily.   Yes [provider]  doxycycline (VIBRA-TABS) 100 MG tablet Take 100 mg by mouth daily. 12/20/21  Yes  [provider]  ferrous sulfate 325 (65 FE) MG tablet Take 325 mg by mouth daily with breakfast.   Yes [provider]  insulin aspart (NOVOLOG) 100 UNIT/ML injection 0-15 Units, Subcutaneous, 3 times daily with meals, CBG < 70: Implement Hypoglycemia measures CBG 70 - 120: 0 units CBG 121 - 150: 2 units CBG 151 - 200: 3 units CBG 201 - 250: 5 units CBG 251 - 300: 8 units CBG 301 - 350: 11 units CBG 351 - 400: 15 units CBG > 400: call MD 12/01/20  Yes Ghimire, Henreitta Leber, MD  irbesartan (AVAPRO) 300 MG tablet Take 300 mg by mouth daily.   Yes [provider]  levothyroxine (SYNTHROID) 137 MCG tablet Take 137 mcg by mouth daily before breakfast. 07/02/18  Yes [provider]  metFORMIN (GLUCOPHAGE) 850 MG tablet Take 850 mg by mouth 2 (two) times daily with a meal.   Yes [provider]  metoprolol succinate (TOPROL-XL) 25 MG 24 hr tablet TAKE 1 TABLET BY MOUTH ONCE  DAILY 11/01/21  Yes Burnell Blanks, MD  Multiple Vitamins-Minerals (MULTIVITAMIN WITH MINERALS) tablet Take 1 tablet by mouth in the morning.   Yes [provider]  spironolactone (ALDACTONE) 25 MG tablet Take 25 mg by mouth in the morning.   Yes [provider]  timolol (TIMOPTIC) 0.5 % ophthalmic solution Place 1 drop into both eyes every morning. 05/22/20  Yes [provider]  TOUJEO SOLOSTAR 300 UNIT/ML Solostar Pen Inject 110-125 Units into the skin See admin instructions. Inject 125 units into the skin in the morning and 110 units at night 09/23/21  Yes [provider]  ELIQUIS 5 MG TABS tablet Take 2.5 mg by mouth 2 (two) times daily. 07/08/21   [provider]     Review of Systems  Positive ROS: As above  All other systems have been reviewed and were otherwise negative with the exception of those mentioned in the HPI and as above.  Objective: Vital signs in last 24 hours: Temp:  [98 F (36.7 C)-98.3 F (36.8 C)] 98 F (36.7 C)  (11/09 0756) Pulse Rate:  [72-82] 72 (11/09 0756) Resp:  [16-17] 16 (11/09 0756) BP: (171-172)/(84-100) 171/84 (11/09 0756) SpO2:  [99 %] 99 % (11/09 0756) Weight:  [91.6 kg] 91.6 kg (11/09 0756) Estimated body mass index is 33.61 kg/m as calculated from the following:   Height as of this encounter: '5\' 5"'$  (1.651 m).   Weight as of this encounter: 91.6 kg.   General Appearance: Alert Head: Normocephalic, without obvious abnormality, atraumatic Eyes: PERRL, conjunctiva/corneas clear, EOM's intact,    Ears: Normal  Throat: Normal  Neck: Supple, Back: unremarkable Lungs: Clear to auscultation bilaterally, respirations unlabored Heart: Regular rate and rhythm, no murmur,  rub or gallop Abdomen: Soft, non-tender Extremities: Extremities normal, atraumatic, no cyanosis or edema Skin: unremarkable  NEUROLOGIC:   Mental status: alert and oriented,Motor Exam - grossly normal Sensory Exam - grossly normal Reflexes:  Coordination - grossly normal Gait - grossly normal Balance - grossly normal Cranial Nerves: I: smell Not tested  II: visual acuity  OS: Normal  OD: Normal   II: visual fields Full to confrontation  II: pupils Equal, round, reactive to light  III,VII: ptosis None  III,IV,VI: extraocular muscles  Full ROM  V: mastication Normal  V: facial light touch sensation  Normal  V,VII: corneal reflex  Present  VII: facial muscle function - upper  Normal  VII: facial muscle function - lower Normal  VIII: hearing Not tested  IX: soft palate elevation  Normal  IX,X: gag reflex Present  XI: trapezius strength  5/5  XI: sternocleidomastoid strength 5/5  XI: neck flexion strength  5/5  XII: tongue strength  Normal    Data Review Lab Results  Component Value Date   WBC 12.1 (H) 01/18/2022   HGB 12.7 01/18/2022   HCT 39.0 01/18/2022   MCV 94.0 01/18/2022   PLT 322 01/18/2022   Lab Results  Component Value Date   NA 137 01/18/2022   K 5.2 (H) 01/18/2022   CL 106  01/18/2022   CO2 23 01/18/2022   BUN 45 (H) 01/18/2022   CREATININE 1.98 (H) 01/18/2022   GLUCOSE 155 (H) 01/18/2022   Lab Results  Component Value Date   INR 1.4 (H) 11/21/2018    Assessment/Plan: Lumbar wound drainage: I have discussed situation with the patient.  I reviewed her imaging studies with her.  We discussed the various treatment options including a revision of her lumbar wound.  I described the procedure.  We have discussed the risk, benefits, alternatives, expected postoperative course, and likelihood of achieving our goals with surgery.  I have answered all her questions.  She has decided proceed with surgery.   Summer Hawkins 01/19/2022 11:22 AM

## 2022-01-19 NOTE — Anesthesia Postprocedure Evaluation (Signed)
Anesthesia Post Note  Patient: Keandrea Tapley Boesch  Procedure(s) Performed: REVISION OF LUMBAR WOUND (Back)     Patient location during evaluation: PACU Anesthesia Type: General Level of consciousness: awake and alert, patient cooperative and oriented Pain management: pain level controlled Vital Signs Assessment: post-procedure vital signs reviewed and stable Respiratory status: spontaneous breathing, nonlabored ventilation and respiratory function stable Cardiovascular status: blood pressure returned to baseline and stable Postop Assessment: no apparent nausea or vomiting Anesthetic complications: no   No notable events documented.  Last Vitals:  Vitals:   01/19/22 1500 01/19/22 1530  BP: (!) 164/89 (!) 158/78  Pulse: 69 69  Resp: 18 20  Temp: 36.4 C   SpO2: 96% 96%    Last Pain:  Vitals:   01/19/22 1445  TempSrc:   PainSc: Asleep                 Sabriah Hobbins,E. Daijon Wenke

## 2022-01-19 NOTE — Op Note (Signed)
Brief history: The patient is an 87 year old white female on whom I performed a lumbar fusion in October 2022.  She developed a postoperative wound infection requiring incision and drainage and wound VAC.  Her wound went on to heal but she is developed a persistent small lesion in the incision.  I discussed the various treatment options.  She has decided proceed with surgery.  Preop diagnosis: Lumbar wound drainage  Postop diagnosis: The same  Procedure: The patient of lumbar wound  Surgeon: Dr. Earle Gell  Assistant: None  Anesthesia: General tracheal  Estimated blood loss: Minimal  Specimens: None  Drains: None  Complications: None  Description of procedure: The patient was brought to the operating room by anesthesia team.  General endotracheal anesthesia was induced.  The patient was turned to the prone position on the Wilson frame.  Her lumbosacral region was then prepared with Betadine scrub and Betadine solution.  Sterile drapes were applied.  I then passed out the patient's cutaneous lesion.  It did not appear to track deeper.  I then reapproximated the patient's subcutaneous tissue with interrupted 2-0 Vicryl suture.  I reapproximated the skin with a running 2-0 nylon suture.  The wound was then coated with bacitracin ointment.  A sterile dressing was applied.  The drapes were removed.  By report all sponge, instrument, and needle counts were correct at the end of this case.

## 2022-01-19 NOTE — Transfer of Care (Signed)
Immediate Anesthesia Transfer of Care Note  Patient: Summer Hawkins  Procedure(s) Performed: REVISION OF LUMBAR WOUND (Back)  Patient Location: PACU  Anesthesia Type:General  Level of Consciousness: awake, alert , and oriented  Airway & Oxygen Therapy: Patient Spontanous Breathing  Post-op Assessment: Report given to RN and Post -op Vital signs reviewed and stable  Post vital signs: Reviewed and stable  Last Vitals:  Vitals Value Taken Time  BP 163/93 01/19/22 1304  Temp    Pulse 72 01/19/22 1307  Resp 13 01/19/22 1307  SpO2 97 % 01/19/22 1307  Vitals shown include unvalidated device data.  Last Pain:  Vitals:   01/19/22 0756  TempSrc: Oral  PainSc: 3       Patients Stated Pain Goal: 3 (55/21/74 7159)  Complications: No notable events documented.

## 2022-01-20 ENCOUNTER — Encounter (HOSPITAL_COMMUNITY): Payer: Self-pay | Admitting: Neurosurgery

## 2022-01-20 DIAGNOSIS — Z853 Personal history of malignant neoplasm of breast: Secondary | ICD-10-CM | POA: Diagnosis not present

## 2022-01-20 DIAGNOSIS — I129 Hypertensive chronic kidney disease with stage 1 through stage 4 chronic kidney disease, or unspecified chronic kidney disease: Secondary | ICD-10-CM | POA: Diagnosis not present

## 2022-01-20 DIAGNOSIS — T8142XA Infection following a procedure, deep incisional surgical site, initial encounter: Secondary | ICD-10-CM | POA: Diagnosis not present

## 2022-01-20 DIAGNOSIS — E039 Hypothyroidism, unspecified: Secondary | ICD-10-CM | POA: Diagnosis not present

## 2022-01-20 DIAGNOSIS — N1831 Chronic kidney disease, stage 3a: Secondary | ICD-10-CM | POA: Diagnosis not present

## 2022-01-20 DIAGNOSIS — E119 Type 2 diabetes mellitus without complications: Secondary | ICD-10-CM | POA: Diagnosis not present

## 2022-01-20 MED ORDER — DOXYCYCLINE HYCLATE 100 MG PO TABS: 100.0000 mg | ORAL_TABLET | Freq: Two times a day (BID) | ORAL | 0 refills | Status: DC

## 2022-01-20 MED ORDER — DOXYCYCLINE HYCLATE 100 MG PO TABS: 100.0000 mg | ORAL_TABLET | Freq: Two times a day (BID) | ORAL | Status: DC

## 2022-01-20 NOTE — Care Management Obs Status (Cosign Needed)
[facility] NOTIFICATION   Patient Details  Name: Summer Hawkins MRN: [id number] Date of Birth: 11-Feb-1935   Medicare Observation Status Notification Given:  Yes    Curlene Labrum, RN 01/20/2022, 9:37 AM

## 2022-01-20 NOTE — Discharge Summary (Signed)
Physician Discharge Summary  Patient ID: Summer Hawkins MRN: [id number] DOB/AGE: 85-Jun-1936 85 y.o.  Admit date: 01/19/2022 Discharge date: 01/20/2022  Admission Diagnoses: Wound drainage  Discharge Diagnoses: The same Principal Problem:   Open wound of lumbar region   Discharged Condition: good  Hospital Course: I performed a revision of the patient's lumbar incision on 01/19/2022.  The surgery went well.  The patient's postoperative course was unremarkable.  On 01/20/2022 she looked and felt well and requested discharge home.  She was given verbal and written discharge instructions.  All her questions were answered.  Consults: None Significant Diagnostic Studies: None Treatments: Revision of lumbar wound Discharge Exam: Blood pressure (!) 148/127, pulse 70, temperature 98.6 F (37 C), resp. rate 17, height '5\' 5"'$  (1.651 m), weight 91.6 kg, SpO2 99 %. The patient is alert and pleasant.  She looks well.  Her strength is normal.  Disposition: Home  Discharge Instructions     Call MD for:  difficulty breathing, headache or visual disturbances   Complete by: As directed    Call MD for:  difficulty breathing, headache or visual disturbances   Complete by: As directed    Call MD for:  extreme fatigue   Complete by: As directed    Call MD for:  extreme fatigue   Complete by: As directed    Call MD for:  hives   Complete by: As directed    Call MD for:  hives   Complete by: As directed    Call MD for:  persistant dizziness or light-headedness   Complete by: As directed    Call MD for:  persistant dizziness or light-headedness   Complete by: As directed    Call MD for:  persistant nausea and vomiting   Complete by: As directed    Call MD for:  persistant nausea and vomiting   Complete by: As directed    Call MD for:  redness, tenderness, or signs of infection (pain, swelling, redness, odor or green/yellow discharge around incision site)   Complete by: As directed    Call MD  for:  redness, tenderness, or signs of infection (pain, swelling, redness, odor or green/yellow discharge around incision site)   Complete by: As directed    Call MD for:  severe uncontrolled pain   Complete by: As directed    Call MD for:  severe uncontrolled pain   Complete by: As directed    Call MD for:  temperature >100.4   Complete by: As directed    Call MD for:  temperature >100.4   Complete by: As directed    Diet - low sodium heart healthy   Complete by: As directed    Diet - low sodium heart healthy   Complete by: As directed    Discharge instructions   Complete by: As directed    Call [phone] for a followup appointment. Take a stool softener while you are using pain medications.   Discharge instructions   Complete by: As directed    Call [phone] for a followup appointment. Take a stool softener while you are using pain medications.   Driving Restrictions   Complete by: As directed    Do not drive for 2 weeks.   Driving Restrictions   Complete by: As directed    Do not drive for 2 weeks.   Increase activity slowly   Complete by: As directed    Increase activity slowly   Complete by: As directed    Lifting restrictions  Complete by: As directed    Do not lift more than 5 pounds. No excessive bending or twisting.   Lifting restrictions   Complete by: As directed    Do not lift more than 5 pounds. No excessive bending or twisting.   May shower / Bathe   Complete by: As directed    Remove the dressing for 3 days after surgery.  You may shower, but leave the incision alone.   May shower / Bathe   Complete by: As directed    Remove the dressing for 3 days after surgery.  You may shower, but leave the incision alone.   Remove dressing in 48 hours   Complete by: As directed       Allergies as of 01/20/2022       Reactions   Bee Venom Shortness Of Breath, Nausea And Vomiting, Other (See Comments)   Makes the patient feel faint, also   Penicillins  Anaphylaxis, Hives, Swelling, Other (See Comments)   Has patient had a PCN reaction causing immediate rash, facial/tongue/throat swelling, SOB or lightheadedness with hypotension: Yes Has patient had a PCN reaction causing severe rash involving mucus membranes or skin necrosis: No Has patient had a PCN reaction that required hospitalization: No Has patient had a PCN reaction occurring within the last 10 years: No If all of the above answers are "NO", then may proceed with Cephalosporin use.   Lisinopril Cough   Zocor [simvastatin] Other (See Comments)   memory changes   Liraglutide Rash, Other (See Comments)   Rash at injection site        Medication List     TAKE these medications    amLODipine 5 MG tablet Commonly known as: NORVASC Take 5 mg by mouth in the morning.   B-12 PO Take 1 tablet by mouth daily.   doxycycline 100 MG tablet Commonly known as: VIBRA-TABS Take 100 mg by mouth daily.   Eliquis 5 MG Tabs tablet Generic drug: apixaban Take 2.5 mg by mouth 2 (two) times daily.   ferrous sulfate 325 (65 FE) MG tablet Take 325 mg by mouth daily with breakfast.   insulin aspart 100 UNIT/ML injection Commonly known as: novoLOG 0-15 Units, Subcutaneous, 3 times daily with meals, CBG < 70: Implement Hypoglycemia measures CBG 70 - 120: 0 units CBG 121 - 150: 2 units CBG 151 - 200: 3 units CBG 201 - 250: 5 units CBG 251 - 300: 8 units CBG 301 - 350: 11 units CBG 351 - 400: 15 units CBG > 400: call MD   irbesartan 300 MG tablet Commonly known as: AVAPRO Take 300 mg by mouth daily.   levothyroxine 137 MCG tablet Commonly known as: SYNTHROID Take 137 mcg by mouth daily before breakfast.   metFORMIN 850 MG tablet Commonly known as: GLUCOPHAGE Take 850 mg by mouth 2 (two) times daily with a meal.   metoprolol succinate 25 MG 24 hr tablet Commonly known as: TOPROL-XL TAKE 1 TABLET BY MOUTH ONCE  DAILY   multivitamin with minerals tablet Take 1 tablet by mouth in the  morning.   spironolactone 25 MG tablet Commonly known as: ALDACTONE Take 25 mg by mouth in the morning.   timolol 0.5 % ophthalmic solution Commonly known as: TIMOPTIC Place 1 drop into both eyes every morning.   Toujeo SoloStar 300 UNIT/ML Solostar Pen Generic drug: insulin glargine (1 Unit Dial) Inject 10-15 Units into the skin See admin instructions. Inject 15 units into the skin in the morning  and then inject 10 units at night per patient         Signed: [name] 01/20/2022, 7:43 AM

## 2022-01-20 NOTE — TOC Transition Note (Signed)
Transition of Care [facility]) - CM/SW Discharge Note   Patient Details  Name: Summer Hawkins MRN: [id number] Date of Birth: 1934/11/13  Transition of Care [facility]) CM/SW Contact:  Curlene Labrum, RN Phone Number: 01/20/2022, 9:43 AM   Clinical Narrative:    CM met with the patient prior to discharge - Medicare Observation letter given to the patient.  Patient plans to return home today by car.   Final next level of care: Home/Self Care Barriers to Discharge: No Barriers Identified   Patient Goals and CMS Choice Patient states their goals for this hospitalization and ongoing recovery are:: To return home CMS Medicare.gov Compare Post Acute Care list provided to:: Patient Choice offered to / list presented to : Patient  Discharge Placement                       Discharge Plan and Services                                     Social Determinants of Health (SDOH) Interventions     Readmission Risk Interventions     No data to display

## 2022-01-23 ENCOUNTER — Telehealth: Payer: Self-pay | Admitting: *Deleted

## 2022-01-23 NOTE — Telephone Encounter (Signed)
Tct-Lakyia message left to return call.

## 2022-02-07 DIAGNOSIS — H43813 Vitreous degeneration, bilateral: Secondary | ICD-10-CM | POA: Diagnosis not present

## 2022-02-07 DIAGNOSIS — E113393 Type 2 diabetes mellitus with moderate nonproliferative diabetic retinopathy without macular edema, bilateral: Secondary | ICD-10-CM | POA: Diagnosis not present

## 2022-02-07 DIAGNOSIS — H353133 Nonexudative age-related macular degeneration, bilateral, advanced atrophic without subfoveal involvement: Secondary | ICD-10-CM | POA: Diagnosis not present

## 2022-02-27 ENCOUNTER — Ambulatory Visit (INDEPENDENT_AMBULATORY_CARE_PROVIDER_SITE_OTHER): Payer: Medicare Other

## 2022-02-27 DIAGNOSIS — I5032 Chronic diastolic (congestive) heart failure: Secondary | ICD-10-CM | POA: Diagnosis not present

## 2022-02-28 LAB — CUP PACEART REMOTE DEVICE CHECK
Battery Remaining Longevity: 117 mo
Battery Voltage: 3 V
Brady Statistic AP VP Percent: 0.17 %
Brady Statistic AP VS Percent: 99.75 %
Brady Statistic AS VP Percent: 0 %
Brady Statistic AS VS Percent: 0.08 %
Brady Statistic RA Percent Paced: 99.95 %
Brady Statistic RV Percent Paced: 0.17 %
Date Time Interrogation Session: 20231217194649
Implantable Lead Connection Status: 753985
Implantable Lead Connection Status: 753985
Implantable Lead Implant Date: 20200921
Implantable Lead Implant Date: 20200921
Implantable Lead Location: 753858
Implantable Lead Location: 753860
Implantable Lead Model: 3830
Implantable Lead Model: 5076
Implantable Pulse Generator Implant Date: 20200921
Lead Channel Impedance Value: 323 Ohm
Lead Channel Impedance Value: 380 Ohm
Lead Channel Impedance Value: 380 Ohm
Lead Channel Impedance Value: 494 Ohm
Lead Channel Sensing Intrinsic Amplitude: 11.5 mV
Lead Channel Sensing Intrinsic Amplitude: 11.625 mV
Lead Channel Sensing Intrinsic Amplitude: 11.625 mV
Lead Channel Sensing Intrinsic Amplitude: 17.25 mV
Lead Channel Setting Pacing Amplitude: 2 V
Lead Channel Setting Pacing Amplitude: 2.5 V
Lead Channel Setting Pacing Pulse Width: 0.4 ms
Lead Channel Setting Sensing Sensitivity: 1.2 mV
Zone Setting Status: 755011
Zone Setting Status: 755011

## 2022-03-20 ENCOUNTER — Telehealth: Payer: Self-pay | Admitting: *Deleted

## 2022-03-20 NOTE — Telephone Encounter (Signed)
[id number] left to please call back.  Call is to touch base with patient.  Have not seen her at church lately.

## 2022-03-23 ENCOUNTER — Encounter: Payer: Self-pay | Admitting: *Deleted

## 2022-03-23 NOTE — Congregational Nurse Program (Signed)
Tcf-Eymi.  Doing well has had the flu for the past week.  But is now better.  Did go out of town to [place] with her son over the holidays.  She did take a car but did also get out and walk about every two hours.  When asked how the area to her spine is that was excised of an abscess about two months ago she said it is fine but does have a little drainage that is clear in it.  Denies any headaches or dizziness.  Urged to not wait and see if the drainage turns brown or creamy again but to let the md know that she is having some drainage from the area.  Stated that she would.  The call was concluded.

## 2022-04-03 NOTE — Progress Notes (Signed)
Remote pacemaker transmission.   

## 2022-04-06 DIAGNOSIS — Z6834 Body mass index (BMI) 34.0-34.9, adult: Secondary | ICD-10-CM | POA: Diagnosis not present

## 2022-04-06 DIAGNOSIS — L24A9 Irritant contact dermatitis due friction or contact with other specified body fluids: Secondary | ICD-10-CM | POA: Diagnosis not present

## 2022-04-27 DIAGNOSIS — L24A9 Irritant contact dermatitis due friction or contact with other specified body fluids: Secondary | ICD-10-CM | POA: Diagnosis not present

## 2022-05-01 DIAGNOSIS — E039 Hypothyroidism, unspecified: Secondary | ICD-10-CM | POA: Diagnosis not present

## 2022-05-01 DIAGNOSIS — E1129 Type 2 diabetes mellitus with other diabetic kidney complication: Secondary | ICD-10-CM | POA: Diagnosis not present

## 2022-05-01 DIAGNOSIS — E7800 Pure hypercholesterolemia, unspecified: Secondary | ICD-10-CM | POA: Diagnosis not present

## 2022-05-01 DIAGNOSIS — I130 Hypertensive heart and chronic kidney disease with heart failure and stage 1 through stage 4 chronic kidney disease, or unspecified chronic kidney disease: Secondary | ICD-10-CM | POA: Diagnosis not present

## 2022-05-01 DIAGNOSIS — R7989 Other specified abnormal findings of blood chemistry: Secondary | ICD-10-CM | POA: Diagnosis not present

## 2022-05-01 DIAGNOSIS — E538 Deficiency of other specified B group vitamins: Secondary | ICD-10-CM | POA: Diagnosis not present

## 2022-05-08 DIAGNOSIS — Z794 Long term (current) use of insulin: Secondary | ICD-10-CM | POA: Diagnosis not present

## 2022-05-08 DIAGNOSIS — I130 Hypertensive heart and chronic kidney disease with heart failure and stage 1 through stage 4 chronic kidney disease, or unspecified chronic kidney disease: Secondary | ICD-10-CM | POA: Diagnosis not present

## 2022-05-08 DIAGNOSIS — E039 Hypothyroidism, unspecified: Secondary | ICD-10-CM | POA: Diagnosis not present

## 2022-05-08 DIAGNOSIS — Z1331 Encounter for screening for depression: Secondary | ICD-10-CM | POA: Diagnosis not present

## 2022-05-08 DIAGNOSIS — I4821 Permanent atrial fibrillation: Secondary | ICD-10-CM | POA: Diagnosis not present

## 2022-05-08 DIAGNOSIS — G5602 Carpal tunnel syndrome, left upper limb: Secondary | ICD-10-CM | POA: Diagnosis not present

## 2022-05-08 DIAGNOSIS — E1122 Type 2 diabetes mellitus with diabetic chronic kidney disease: Secondary | ICD-10-CM | POA: Diagnosis not present

## 2022-05-08 DIAGNOSIS — Z7901 Long term (current) use of anticoagulants: Secondary | ICD-10-CM | POA: Diagnosis not present

## 2022-05-08 DIAGNOSIS — I509 Heart failure, unspecified: Secondary | ICD-10-CM | POA: Diagnosis not present

## 2022-05-08 DIAGNOSIS — N1832 Chronic kidney disease, stage 3b: Secondary | ICD-10-CM | POA: Diagnosis not present

## 2022-05-08 DIAGNOSIS — R82998 Other abnormal findings in urine: Secondary | ICD-10-CM | POA: Diagnosis not present

## 2022-05-08 DIAGNOSIS — C50412 Malignant neoplasm of upper-outer quadrant of left female breast: Secondary | ICD-10-CM | POA: Diagnosis not present

## 2022-05-08 DIAGNOSIS — Z1339 Encounter for screening examination for other mental health and behavioral disorders: Secondary | ICD-10-CM | POA: Diagnosis not present

## 2022-05-08 DIAGNOSIS — Z950 Presence of cardiac pacemaker: Secondary | ICD-10-CM | POA: Diagnosis not present

## 2022-05-08 DIAGNOSIS — Z0000 Encounter for general adult medical examination without abnormal findings: Secondary | ICD-10-CM | POA: Diagnosis not present

## 2022-05-09 ENCOUNTER — Other Ambulatory Visit: Payer: Self-pay | Admitting: Cardiovascular Disease

## 2022-05-26 DIAGNOSIS — L24A9 Irritant contact dermatitis due friction or contact with other specified body fluids: Secondary | ICD-10-CM | POA: Diagnosis not present

## 2022-05-28 NOTE — Progress Notes (Unsigned)
No chief complaint on file.   History of Present Illness: 88 yo female with with history of persistent atrial fibrillation, bradycardia, DM, hypothyroidism, HTN and hyperlipidemia today for cardiac followup. She was found to have atrial fibrillation in 2012. She had no awareness of palpitations. No stress induced ischemia on nuclear study. Low risk study. Normal LVEF. I started Toprol XL and Pradaxa. She has since been changed to Eliquis. Echo on 01/12/11 with normal LV size and function, LVEF of 55-60%, mild LVH, moderate TR, mild MR. She was cardioverted on 02/27/11. She was seen in primary care in May 2019 with dyspnea and weakness. HR was in the 40s on 06/22/17 with atrial fib. Her Toprol dose was reduced to 25 mg daily. She was felt to be volume overloaded and was started on Lasix 20 mg daily. I saw her in our office 07/13/17 and her heart rate was improved. Her symptoms had resolved. 48 hour cardiac monitor May 2019 showed atrial fib with lowest heart rate 57 bpm, PVCs. Echo May 2019 with LvEF=55-60%, no wall motion abnormalities. No significant valve disease. She was admitted to [facility] July 16-18, 2019 with symptomatic bradycardia and decompensated CHF. Her heart rate was in the 40s. Her beta blocker was stopped and her heart rate improved. She was diuresed with IV Lasix. Norvasc added due to uncontrolled HTN. She was seen in EP clinic November 2019 and it was felt that she did not need a pacemaker. Event monitor November 2019 with no high grade AV block. She was seen in the ED 11/15/18 after a syncopal event and was found to be in atrial fibrillation. She was orthostatic at office follow up 11/21/18. She was readmitted with syncope and bradycardia 11/30/18 and had a pacemaker placed. No recurrent syncope following pacemaker placement.   She is here today for follow up. The patient denies any chest pain, dyspnea, palpitations, lower extremity edema, orthopnea, PND, dizziness, near syncope or syncope.    Primary Care Physician: Haywood Pao, MD  Past Medical History:  Diagnosis Date   Atrial fibrillation [facility])    Breast cancer [facility])    Breast cancer, left ([place])    Chronic kidney disease    stage III - patient was unaware   Diabetes mellitus    Dyspnea    Dysrhythmia    Afib   H/O: hysterectomy    History of colonoscopy 01/25/2010   History of mammogram 08/04/2009   Hyperlipidemia    Hypertension    Hypothyroidism    Ketoacidosis, diabetic, no coma, non-insulin dependent    Type II   Presence of permanent cardiac pacemaker    Vitamin B12 deficiency     Past Surgical History:  Procedure Laterality Date   ABDOMINAL HYSTERECTOMY     BACK SURGERY     Bilateral foot surgery     BREAST EXCISIONAL BIOPSY Left 12/2015   BREAST LUMPECTOMY Left    2017   BREAST LUMPECTOMY WITH RADIOACTIVE SEED AND SENTINEL LYMPH NODE BIOPSY Left 01/19/2016   Procedure: LEFT BREAST LUMPECTOMY WITH RADIOACTIVE SEED AND SENTINEL LYMPH NODE BIOPSY;  Surgeon: Autumn Messing III, MD;  Location: [place];  Service: General;  Laterality: Left;   BREAST LUMPECTOMY WITH RADIOACTIVE SEED LOCALIZATION Left 01/19/2016   CARDIOVERSION  02/27/2011   Procedure: CARDIOVERSION;  Surgeon: Loralie Champagne, MD;  Location: [place];  Service: Cardiovascular;  Laterality: N/A;   INSERT / REPLACE / REMOVE PACEMAKER     LAPAROSCOPIC CHOLECYSTECTOMY     LUMBAR WOUND DEBRIDEMENT N/A  12/20/2020   Procedure: LUMBAR WOUND DEBRIDEMENT WITH PLACEMENTOF LUMBAR WOUND VAC;  Surgeon: Newman Pies, MD;  Location: [place];  Service: Neurosurgery;  Laterality: N/A;   PACEMAKER IMPLANT N/A 12/02/2018   Procedure: PACEMAKER IMPLANT;  Surgeon: Evans Lance, MD;  Location: [place] [facility];  Service: Cardiovascular;  Laterality: N/A;   ROTATOR CUFF REPAIR Left    TUBAL LIGATION     WOUND EXPLORATION N/A 01/19/2022   Procedure: REVISION OF LUMBAR WOUND;  Surgeon: Newman Pies, MD;  Location: [place];  Service: Neurosurgery;   Laterality: N/A;  3C    Current Outpatient Medications  Medication Sig Dispense Refill   amLODipine (NORVASC) 5 MG tablet Take 5 mg by mouth in the morning.     apixaban (ELIQUIS) 2.5 MG TABS tablet Take 2.5 mg by mouth 2 (two) times daily.     Cyanocobalamin (B-12 PO) Take 1 tablet by mouth daily.     doxycycline (VIBRA-TABS) 100 MG tablet Take 100 mg by mouth 2 (two) times daily.     doxycycline (VIBRA-TABS) 100 MG tablet Take 1 tablet (100 mg total) by mouth every 12 (twelve) hours. 28 tablet 0   ferrous sulfate 325 (65 FE) MG tablet Take 325 mg by mouth daily with breakfast.     insulin aspart (NOVOLOG) 100 UNIT/ML injection 0-15 Units, Subcutaneous, 3 times daily with meals, CBG < 70: Implement Hypoglycemia measures CBG 70 - 120: 0 units CBG 121 - 150: 2 units CBG 151 - 200: 3 units CBG 201 - 250: 5 units CBG 251 - 300: 8 units CBG 301 - 350: 11 units CBG 351 - 400: 15 units CBG > 400: call MD 10 mL 11   irbesartan (AVAPRO) 300 MG tablet Take 300 mg by mouth daily.     levothyroxine (SYNTHROID) 137 MCG tablet Take 137 mcg by mouth daily before breakfast.     metFORMIN (GLUCOPHAGE) 850 MG tablet Take 850 mg by mouth 2 (two) times daily with a meal.     metoprolol succinate (TOPROL-XL) 25 MG 24 hr tablet TAKE 1 TABLET BY MOUTH ONCE  DAILY 90 tablet 1   Multiple Vitamins-Minerals (MULTIVITAMIN WITH MINERALS) tablet Take 1 tablet by mouth in the morning.     spironolactone (ALDACTONE) 25 MG tablet Take 25 mg by mouth in the morning.     timolol (TIMOPTIC) 0.5 % ophthalmic solution Place 1 drop into both eyes every morning.     TOUJEO SOLOSTAR 300 UNIT/ML Solostar Pen Inject 10-15 Units into the skin See admin instructions. Inject 15 units into the skin in the morning and then inject 10 units at night per patient     No current facility-administered medications for this visit.    Allergies  Allergen Reactions   Bee Venom Shortness Of Breath, Nausea And Vomiting and Other (See Comments)     Makes the patient feel faint, also   Penicillins Anaphylaxis, Hives, Swelling and Other (See Comments)    Has patient had a PCN reaction causing immediate rash, facial/tongue/throat swelling, SOB or lightheadedness with hypotension: Yes Has patient had a PCN reaction causing severe rash involving mucus membranes or skin necrosis: No Has patient had a PCN reaction that required hospitalization: No Has patient had a PCN reaction occurring within the last 10 years: No If all of the above answers are "NO", then may proceed with Cephalosporin use.    Lisinopril Cough   Zocor [Simvastatin] Other (See Comments)    memory changes   Liraglutide Rash and  Other (See Comments)    Rash at injection site    Social History   Socioeconomic History   Marital status: Widowed    Spouse name: Not on file   Number of children: 2   Years of education: Not on file   Highest education level: Not on file  Occupational History    Employer: OTHER    Comment: Worked at [employer] Use   Smoking status: Never   Smokeless tobacco: Never  Vaping Use   Vaping Use: Never used  Substance and Sexual Activity   Alcohol use: No    Alcohol/week: 0.0 standard drinks of alcohol   Drug use: No   Sexual activity: Not on file  Other Topics Concern   Not on file  Social History Narrative   Patient since 20   Husband with prostate cancer   10-siblings-no cancer   Social Determinants of Health   Financial Resource Strain: Not on file  Food Insecurity: No Food Insecurity (01/19/2022)   Hunger Vital Sign    Worried About Running Out of Food in the Last Year: Never true    Ran Out of Food in the Last Year: Never true  Transportation Needs: No Transportation Needs (01/19/2022)   PRAPARE - Hydrologist (Medical): No    Lack of Transportation (Non-Medical): No  Physical Activity: Not on file  Stress: Not on file  Social Connections: Not on file  Intimate Partner Violence: Not At  Risk (01/19/2022)   Humiliation, Afraid, Rape, and Kick questionnaire    Fear of Current or Ex-Partner: No    Emotionally Abused: No    Physically Abused: No    Sexually Abused: No    Family History  Problem Relation Age of Onset   Heart failure Father 23       enlarged heart   Pneumonia Mother 52   Diabetes Mother 52   Breast cancer Sister    Cancer Neg Hx     Review of Systems:  As stated in the HPI and otherwise negative.   LMP  (LMP Unknown)   Physical Examination: General: Well developed, well nourished, NAD  HEENT: OP clear, mucus membranes moist  SKIN: warm, dry. No rashes. Neuro: No focal deficits  Musculoskeletal: Muscle strength 5/5 all ext  Psychiatric: Mood and affect normal  Neck: No JVD, no carotid bruits, no thyromegaly, no lymphadenopathy.  Lungs:Clear bilaterally, no wheezes, rhonci, crackles Cardiovascular: Regular rate and rhythm. No murmurs, gallops or rubs. Abdomen:Soft. Bowel sounds present. Non-tender.  Extremities: No lower extremity edema. Pulses are 2 + in the bilateral DP/PT.  Echo May 2019: - Left ventricle: The cavity size was normal. Wall thickness was   increased in a pattern of mild LVH. Systolic function was normal.   The estimated ejection fraction was in the range of 55% to 60%.   Wall motion was normal; there were no regional wall motion   abnormalities. - Mitral valve: Mildly to moderately calcified annulus. Mildly   thickened leaflets . - Left atrium: The atrium was mildly dilated. - Pulmonary arteries: Systolic pressure was mildly increased. PA   peak pressure: 36 mm Hg (S).  EKG:  EKG is *** ordered today. The ekg ordered today demonstrates   Recent Labs: 01/18/2022: BUN 45; Creatinine, Ser 1.98; Hemoglobin 12.7; Platelets 322; Potassium 5.2; Sodium 137   Lipid Panel No results found for: "CHOL", "TRIG", "HDL", "CHOLHDL", "VLDL", "LDLCALC", "LDLDIRECT"   Wt Readings from Last 3 Encounters:  01/19/22  91.6 kg  01/18/22 91.6  kg  12/30/21 91.6 kg    Assessment and Plan:   1. Atrial fibrillation, permanent/Symptomatic Bradycardia: Rate controlled atrial fib today. Pacemaker in place. Continue Eliquis and Toprol.   2. HTN: BP is controlled. No changes today  3. Mitral valve insufficiency: Trivial by echo May 2019.   4. Chronic diastolic CHF: Weight is stable. Lasix as needed.   Labs/ tests ordered today include:  No orders of the defined types were placed in this encounter.  Follow up  Signed, Lauree Chandler, MD 05/28/2022 1:35 PM    [facility] [place], [place], [place]  [address] Phone: [phone]; Fax: [phone]

## 2022-05-29 ENCOUNTER — Ambulatory Visit: Payer: Medicare Other | Attending: Cardiovascular Disease | Admitting: Cardiovascular Disease

## 2022-05-29 ENCOUNTER — Encounter: Payer: Self-pay | Admitting: Cardiovascular Disease

## 2022-05-29 ENCOUNTER — Ambulatory Visit (INDEPENDENT_AMBULATORY_CARE_PROVIDER_SITE_OTHER): Payer: Medicare Other

## 2022-05-29 VITALS — BP 132/80 | HR 70 | Ht 65.0 in | Wt 204.0 lb

## 2022-05-29 DIAGNOSIS — I10 Essential (primary) hypertension: Secondary | ICD-10-CM | POA: Diagnosis not present

## 2022-05-29 DIAGNOSIS — I4821 Permanent atrial fibrillation: Secondary | ICD-10-CM | POA: Diagnosis not present

## 2022-05-29 DIAGNOSIS — R001 Bradycardia, unspecified: Secondary | ICD-10-CM | POA: Diagnosis not present

## 2022-05-29 DIAGNOSIS — I5032 Chronic diastolic (congestive) heart failure: Secondary | ICD-10-CM

## 2022-05-29 DIAGNOSIS — Z950 Presence of cardiac pacemaker: Secondary | ICD-10-CM | POA: Diagnosis not present

## 2022-05-29 MED ORDER — AMLODIPINE BESYLATE 5 MG PO TABS: 5.0000 mg | ORAL_TABLET | Freq: Every morning | ORAL | 3 refills | Status: DC

## 2022-05-29 NOTE — Patient Instructions (Signed)
Medication Instructions:  No changes *If you need a refill on your cardiac medications before your next appointment, please call your pharmacy*   Lab Work: none If you have labs (blood work) drawn today and your tests are completely normal, you will receive your results only by: MyChart Message (if you have MyChart) OR A paper copy in the mail If you have any lab test that is abnormal or we need to change your treatment, we will call you to review the results.   Testing/Procedures: none   Follow-Up: At [facility], you and your health needs are our priority.  As part of our continuing mission to provide you with exceptional heart care, we have created designated Provider Care Teams.  These Care Teams include your primary Cardiologist (physician) and Advanced Practice Providers (APPs -  Physician Assistants and Nurse Practitioners) who all work together to provide you with the care you need, when you need it.     Your next appointment:   12 month(s)  Provider:   Christopher McAlhany, MD      

## 2022-05-30 LAB — CUP PACEART REMOTE DEVICE CHECK
Battery Remaining Longevity: 114 mo
Battery Voltage: 3 V
Brady Statistic AP VP Percent: 0.09 %
Brady Statistic AP VS Percent: 99.87 %
Brady Statistic AS VP Percent: 0 %
Brady Statistic AS VS Percent: 0.04 %
Brady Statistic RA Percent Paced: 99.97 %
Brady Statistic RV Percent Paced: 0.09 %
Date Time Interrogation Session: 20240318010352
Implantable Lead Connection Status: 753985
Implantable Lead Connection Status: 753985
Implantable Lead Implant Date: 20200921
Implantable Lead Implant Date: 20200921
Implantable Lead Location: 753858
Implantable Lead Location: 753860
Implantable Lead Model: 3830
Implantable Lead Model: 5076
Implantable Pulse Generator Implant Date: 20200921
Lead Channel Impedance Value: 323 Ohm
Lead Channel Impedance Value: 361 Ohm
Lead Channel Impedance Value: 399 Ohm
Lead Channel Impedance Value: 494 Ohm
Lead Channel Sensing Intrinsic Amplitude: 11.5 mV
Lead Channel Sensing Intrinsic Amplitude: 11.625 mV
Lead Channel Sensing Intrinsic Amplitude: 11.625 mV
Lead Channel Sensing Intrinsic Amplitude: 17.25 mV
Lead Channel Setting Pacing Amplitude: 2 V
Lead Channel Setting Pacing Amplitude: 2.5 V
Lead Channel Setting Pacing Pulse Width: 0.4 ms
Lead Channel Setting Sensing Sensitivity: 1.2 mV
Zone Setting Status: 755011
Zone Setting Status: 755011

## 2022-06-01 ENCOUNTER — Telehealth: Payer: Self-pay | Admitting: *Deleted

## 2022-06-01 NOTE — Telephone Encounter (Signed)
Dental appt on [id number] will provide transport to and from

## 2022-06-05 ENCOUNTER — Encounter: Payer: Self-pay | Admitting: *Deleted

## 2022-06-05 ENCOUNTER — Telehealth: Payer: Self-pay | Admitting: Cardiovascular Disease

## 2022-06-05 ENCOUNTER — Telehealth: Payer: Self-pay | Admitting: *Deleted

## 2022-06-05 NOTE — Telephone Encounter (Signed)
Spoke with patient and she stated can you mail her a list of her medications, so when she is going to her appointments and they ask about medications she does not have to be going through all of her things.

## 2022-06-05 NOTE — Telephone Encounter (Signed)
   Primary Cardiologist: Lauree Chandler, MD  Chart reviewed as part of pre-operative protocol coverage. Simple dental extractions (1-2 teeth) are considered low risk procedures per guidelines and generally do not require any specific cardiac clearance. It is also generally accepted that for simple extractions and dental cleanings, there is no need to interrupt blood thinner therapy.   SBE prophylaxis is not required for the patient.  I will route this recommendation to the requesting party via Epic fax function and remove from pre-op pool.  Please call with questions.  Emmaline Life, NP-C  06/05/2022, 3:25 PM [address], [address] Office [phone] Fax [phone]

## 2022-06-05 NOTE — Telephone Encounter (Signed)
Patient called requesting speak to the doctor's nurse.

## 2022-06-05 NOTE — Congregational Nurse Program (Signed)
Patient has several decaying teeth and wanted full oral xrays done .  Wanted to [facility] on hwt [facility].  Transportation provided and stayed with client during visit.  Plan is for her to see an oral surgeon about some overgrowth of jawbone material.  Patient prefers to make her own appointment and weill follow up with myself for transportation if needed.

## 2022-06-05 NOTE — Telephone Encounter (Signed)
   Pre-operative Risk Assessment    Patient Name: Summer Hawkins  DOB: May 27, 1934 MRN: [id number]     Request for Surgical Clearance    Procedure:  Dental Extraction - Amount of Teeth to be Pulled:  2 SIMPLE  Date of Surgery:  Clearance TBD                                 Surgeon:  Sander Nephew, DDS Surgeon's Group or Practice Name:  Sander Nephew, DDS Phone number:  [phone] Fax number:  [phone]   Type of Clearance Requested:   - Medical  - Pharmacy:  Hold Apixaban (Eliquis) NOT INDICATED HOW LONG   Type of Anesthesia:  Not Indicated   Additional requests/questions:    [name]   06/05/2022, 3:13 PM

## 2022-06-06 NOTE — Telephone Encounter (Signed)
Medication list mailed to patient.

## 2022-06-07 ENCOUNTER — Ambulatory Visit (INDEPENDENT_AMBULATORY_CARE_PROVIDER_SITE_OTHER): Payer: Medicare Other | Admitting: Infectious Disease

## 2022-06-07 ENCOUNTER — Encounter: Payer: Self-pay | Admitting: Infectious Disease

## 2022-06-07 ENCOUNTER — Other Ambulatory Visit: Payer: Self-pay

## 2022-06-07 VITALS — BP 125/81 | HR 73 | Resp 16 | Ht 65.0 in | Wt 202.0 lb

## 2022-06-07 DIAGNOSIS — Z950 Presence of cardiac pacemaker: Secondary | ICD-10-CM | POA: Diagnosis not present

## 2022-06-07 DIAGNOSIS — S31000A Unspecified open wound of lower back and pelvis without penetration into retroperitoneum, initial encounter: Secondary | ICD-10-CM | POA: Diagnosis not present

## 2022-06-07 DIAGNOSIS — Z794 Long term (current) use of insulin: Secondary | ICD-10-CM

## 2022-06-07 DIAGNOSIS — I4821 Permanent atrial fibrillation: Secondary | ICD-10-CM | POA: Diagnosis not present

## 2022-06-07 DIAGNOSIS — Z981 Arthrodesis status: Secondary | ICD-10-CM | POA: Diagnosis not present

## 2022-06-07 DIAGNOSIS — T847XXA Infection and inflammatory reaction due to other internal orthopedic prosthetic devices, implants and grafts, initial encounter: Secondary | ICD-10-CM

## 2022-06-07 DIAGNOSIS — E119 Type 2 diabetes mellitus without complications: Secondary | ICD-10-CM | POA: Diagnosis not present

## 2022-06-07 DIAGNOSIS — C50412 Malignant neoplasm of upper-outer quadrant of left female breast: Secondary | ICD-10-CM

## 2022-06-07 DIAGNOSIS — A498 Other bacterial infections of unspecified site: Secondary | ICD-10-CM | POA: Diagnosis not present

## 2022-06-07 DIAGNOSIS — N183 Chronic kidney disease, stage 3 unspecified: Secondary | ICD-10-CM | POA: Diagnosis not present

## 2022-06-07 DIAGNOSIS — Z170 Estrogen receptor positive status [ER+]: Secondary | ICD-10-CM | POA: Diagnosis not present

## 2022-06-07 NOTE — Progress Notes (Signed)
Chief complaint drainage some lumbar wound  Subjective:    Patient ID: Summer Hawkins, female    DOB: 14-Mar-1934, 88 y.o.   MRN: [id number]  HPI  Summer Hawkins is an 88 year old woman with hx of surgery in September of 2022  Bilateral L3-4 and L4-5 laminotomy/foraminotomies/medial facetectomy to decompress the bilateral L3, L4 and L5 nerve roots(the work required to do this was in addition to the work required to do the posterior lumbar interbody fusion because of the patient's spinal stenosis, facet arthropathy. Etc. requiring a wide decompression of the nerve roots.);  L3-4 and L4-5 transforaminal lumbar interbody fusion with Proteo Os, local morselized autograft bone and Zimmer DBM; insertion of interbody prosthesis at L3-4 and L4-5 (globus peek expandable interbody prosthesis); posterior segmental instrumentation from L3 to L5 with globus titanium pedicle screws and rods; posterior lateral arthrodesis at L3-4 and L4-5 with Proteo OS, local morselized autograft bone and Zimmer DBM.   Shortly after surgery she was readmitted to the hospital with Proteus bacteremia.  Neurosurgery at the time did not suspect infection at the surgical site.  She received the antibiotics followed by oral cefadroxil to complete 14 days.  She was then readmitted with drainage from her lumbar wound.  She went back to the operating room on 12/20/2020 and underwent  Incision and drainage of wound with placement of wound VAC .  Operative cultures and singly yielded Proteus which was the same bacteria that was in her blood.  She was discharged on 6 weeks of cefazolin but not seen by [place] and infectious ease since then.  She did have further I&D with neurosurgery and Dr. Arnoldo Morale on overnight 2023.  I do not see that new cultures were obtained.  She has had continued drainage from the site ever since and then various courses of antibiotics including doxycycline and clarithromycin.  Dr. Arnoldo Morale is concerned that there is a  fistula between the wound and surgical site.  I would assume that the hardware is likely infected.  Unfortunately the macrolides and tetracyclines that she has been given would not have activity against Proteus.  Dr. Arnoldo Morale referred her back to [place] for follow-up.  She does not have a follow-up with him in the next few weeks but was told to follow-up in roughly a month.  I would like to get labs on her and trial her on cefadroxil.  In reviewing her penicillin allergy she tells me that she licked some penicillins from a spoon that she had given her child 50 years ago and that she developed swelling of her lips.  [facility] and ultimately was sent to the ER where she was told they would need to perform a tracheostomy it sounds as if she was given epinephrine with resolution of what sounds like angioedema.  Has tolerated first-generation cephalosporins recently when she was treated with cefazolin  Past Medical History:  Diagnosis Date   Atrial fibrillation ([place])    Breast cancer ([place])    Breast cancer, left ([place])    Chronic kidney disease    stage III - patient was unaware   Diabetes mellitus    Dyspnea    Dysrhythmia    Afib   H/O: hysterectomy    History of colonoscopy 01/25/2010   History of mammogram 08/04/2009   Hyperlipidemia    Hypertension    Hypothyroidism    Ketoacidosis, diabetic, no coma, non-insulin dependent    Type II   Presence of permanent cardiac pacemaker  Vitamin B12 deficiency     Past Surgical History:  Procedure Laterality Date   ABDOMINAL HYSTERECTOMY     BACK SURGERY     Bilateral foot surgery     BREAST EXCISIONAL BIOPSY Left 12/2015   BREAST LUMPECTOMY Left    2017   BREAST LUMPECTOMY WITH RADIOACTIVE SEED AND SENTINEL LYMPH NODE BIOPSY Left 01/19/2016   Procedure: LEFT BREAST LUMPECTOMY WITH RADIOACTIVE SEED AND SENTINEL LYMPH NODE BIOPSY;  Surgeon: Autumn Messing III, MD;  Location: [facility];  Service: General;  Laterality: Left;    BREAST LUMPECTOMY WITH RADIOACTIVE SEED LOCALIZATION Left 01/19/2016   CARDIOVERSION  02/27/2011   Procedure: CARDIOVERSION;  Surgeon: Loralie Champagne, MD;  Location: [facility];  Service: Cardiovascular;  Laterality: N/A;   INSERT / REPLACE / REMOVE PACEMAKER     LAPAROSCOPIC CHOLECYSTECTOMY     LUMBAR WOUND DEBRIDEMENT N/A 12/20/2020   Procedure: LUMBAR WOUND DEBRIDEMENT WITH PLACEMENTOF LUMBAR WOUND VAC;  Surgeon: Newman Pies, MD;  Location: [facility];  Service: Neurosurgery;  Laterality: N/A;   PACEMAKER IMPLANT N/A 12/02/2018   Procedure: PACEMAKER IMPLANT;  Surgeon: Evans Lance, MD;  Location: [facility];  Service: Cardiovascular;  Laterality: N/A;   ROTATOR CUFF REPAIR Left    TUBAL LIGATION     WOUND EXPLORATION N/A 01/19/2022   Procedure: REVISION OF LUMBAR WOUND;  Surgeon: Newman Pies, MD;  Location: [facility];  Service: Neurosurgery;  Laterality: N/A;  3C    Family History  Problem Relation Age of Onset   Heart failure Father 60       enlarged heart   Pneumonia Mother 34   Diabetes Mother 75   Breast cancer Sister    Cancer Neg Hx       Social History   Socioeconomic History   Marital status: Widowed    Spouse name: Not on file   Number of children: 2   Years of education: Not on file   Highest education level: Not on file  Occupational History    Employer: OTHER    Comment: Worked at [employer]   Smoking status: Never   Smokeless tobacco: Never  Vaping Use   Vaping Use: Never used  Substance and Sexual Activity   Alcohol use: No    Alcohol/week: 0.0 standard drinks of alcohol   Drug use: No   Sexual activity: Not on file  Other Topics Concern   Not on file  Social History Narrative   Patient since 68   Husband with prostate cancer   10-siblings-no cancer   Social Determinants of Health   Financial Resource Strain: Not on file  Food Insecurity: No Food Insecurity (01/19/2022)   Hunger Vital Sign    Worried About Running Out of Food  in the Last Year: Never true    Ran Out of Food in the Last Year: Never true  Transportation Needs: No Transportation Needs (01/19/2022)   PRAPARE - Hydrologist (Medical): No    Lack of Transportation (Non-Medical): No  Physical Activity: Not on file  Stress: Not on file  Social Connections: Not on file    Allergies  Allergen Reactions   Bee Venom Shortness Of Breath, Nausea And Vomiting and Other (See Comments)    Makes the patient feel faint, also   Penicillins Anaphylaxis, Hives, Swelling and Other (See Comments)    Has patient had a PCN reaction causing immediate rash, facial/tongue/throat swelling, SOB or lightheadedness with hypotension: Yes Has patient  had a PCN reaction causing severe rash involving mucus membranes or skin necrosis: No Has patient had a PCN reaction that required hospitalization: No Has patient had a PCN reaction occurring within the last 10 years: No If all of the above answers are "NO", then may proceed with Cephalosporin use.    Lisinopril Cough   Zocor [Simvastatin] Other (See Comments)    memory changes   Liraglutide Rash and Other (See Comments)    Rash at injection site     Current Outpatient Medications:    amLODipine (NORVASC) 5 MG tablet, Take 1 tablet (5 mg total) by mouth in the morning., Disp: 90 tablet, Rfl: 3   apixaban (ELIQUIS) 2.5 MG TABS tablet, Take 2.5 mg by mouth 2 (two) times daily., Disp: , Rfl:    Cyanocobalamin (B-12 PO), Take 1 tablet by mouth daily., Disp: , Rfl:    doxycycline (VIBRA-TABS) 100 MG tablet, Take 100 mg by mouth 2 (two) times daily. (Patient not taking: Reported on 05/29/2022), Disp: , Rfl:    doxycycline (VIBRA-TABS) 100 MG tablet, Take 1 tablet (100 mg total) by mouth every 12 (twelve) hours. (Patient not taking: Reported on 05/29/2022), Disp: 28 tablet, Rfl: 0   ferrous sulfate 325 (65 FE) MG tablet, Take 325 mg by mouth daily with breakfast., Disp: , Rfl:    insulin aspart (NOVOLOG)  100 UNIT/ML injection, 0-15 Units, Subcutaneous, 3 times daily with meals, CBG < 70: Implement Hypoglycemia measures CBG 70 - 120: 0 units CBG 121 - 150: 2 units CBG 151 - 200: 3 units CBG 201 - 250: 5 units CBG 251 - 300: 8 units CBG 301 - 350: 11 units CBG 351 - 400: 15 units CBG > 400: call MD, Disp: 10 mL, Rfl: 11   irbesartan (AVAPRO) 300 MG tablet, Take 300 mg by mouth daily., Disp: , Rfl:    levothyroxine (SYNTHROID) 137 MCG tablet, Take 137 mcg by mouth daily before breakfast., Disp: , Rfl:    metFORMIN (GLUCOPHAGE) 850 MG tablet, Take 850 mg by mouth 2 (two) times daily with a meal., Disp: , Rfl:    metoprolol succinate (TOPROL-XL) 25 MG 24 hr tablet, TAKE 1 TABLET BY MOUTH ONCE  DAILY, Disp: 90 tablet, Rfl: 1   Multiple Vitamins-Minerals (MULTIVITAMIN WITH MINERALS) tablet, Take 1 tablet by mouth in the morning., Disp: , Rfl:    spironolactone (ALDACTONE) 25 MG tablet, Take 25 mg by mouth in the morning., Disp: , Rfl:    timolol (TIMOPTIC) 0.5 % ophthalmic solution, Place 1 drop into both eyes every morning., Disp: , Rfl:    TOUJEO SOLOSTAR 300 UNIT/ML Solostar Pen, Inject 10-15 Units into the skin See admin instructions. Inject 15 units into the skin in the morning and then inject 10 units at night per patient, Disp: , Rfl:     Review of Systems  Constitutional:  Negative for activity change, appetite change, chills, diaphoresis, fatigue, fever and unexpected weight change.  HENT:  Negative for congestion, rhinorrhea, sinus pressure, sneezing, sore throat and trouble swallowing.   Eyes:  Negative for photophobia and visual disturbance.  Respiratory:  Negative for cough, chest tightness, shortness of breath, wheezing and stridor.   Cardiovascular:  Negative for chest pain, palpitations and leg swelling.  Gastrointestinal:  Negative for abdominal distention, abdominal pain, anal bleeding, blood in stool, constipation, diarrhea, nausea and vomiting.  Genitourinary:  Negative for difficulty  urinating, dysuria, flank pain and hematuria.  Musculoskeletal:  Negative for arthralgias, back pain, gait problem, joint swelling  and myalgias.  Skin:  Positive for wound. Negative for color change, pallor and rash.  Neurological:  Negative for dizziness, tremors, weakness and light-headedness.  Hematological:  Negative for adenopathy. Does not bruise/bleed easily.  Psychiatric/Behavioral:  Negative for agitation, behavioral problems, confusion, decreased concentration, dysphoric mood and sleep disturbance.        Objective:   Physical Exam Constitutional:      General: She is not in acute distress.    Appearance: Normal appearance. She is well-developed. She is not ill-appearing or diaphoretic.  HENT:     Head: Normocephalic and atraumatic.     Right Ear: Hearing and external ear normal.     Left Ear: Hearing and external ear normal.     Nose: No nasal deformity or rhinorrhea.  Eyes:     General: No scleral icterus.    Conjunctiva/sclera: Conjunctivae normal.     Right eye: Right conjunctiva is not injected.     Left eye: Left conjunctiva is not injected.     Pupils: Pupils are equal, round, and reactive to light.  Neck:     Vascular: No JVD.  Cardiovascular:     Rate and Rhythm: Normal rate and regular rhythm.     Heart sounds: Normal heart sounds, S1 normal and S2 normal. No murmur heard.    No friction rub.  Abdominal:     General: Bowel sounds are normal. There is no distension.     Palpations: Abdomen is soft.  Musculoskeletal:        General: Normal range of motion.     Right shoulder: Normal.     Left shoulder: Normal.     Cervical back: Normal range of motion and neck supple.     Right hip: Normal.     Left hip: Normal.     Right knee: Normal.     Left knee: Normal.  Lymphadenopathy:     Head:     Right side of head: No submandibular, preauricular or posterior auricular adenopathy.     Left side of head: No submandibular, preauricular or posterior auricular  adenopathy.     Cervical: No cervical adenopathy.     Right cervical: No superficial or deep cervical adenopathy.    Left cervical: No superficial or deep cervical adenopathy.  Skin:    General: Skin is warm and dry.     Coloration: Skin is not pale.     Findings: No abrasion, bruising, ecchymosis, erythema, lesion or rash.     Nails: There is no clubbing.  Neurological:     Mental Status: She is alert and oriented to person, place, and time.     Sensory: No sensory deficit.     Coordination: Coordination normal.     Gait: Gait normal.  Psychiatric:        Attention and Perception: She is attentive.        Mood and Affect: Mood normal.        Speech: Speech normal.        Behavior: Behavior normal. Behavior is cooperative.        Thought Content: Thought content normal.        Judgment: Judgment normal.     Wound 06/07/2022:       Assessment & Plan:   Postoperative chronic wound after lumbar fusion (with hardware) and proteus isolated from blood cultures and then from his lumbar wound  Her story is suspicious to me of potential deep infection involving hardware.  I will check a  sed rate CRP CMP and CBC with differential.  After we have information on her GFR I will send in cefadroxil and if her creatinine is relatively normal we will plan on 2 tablets twice daily and plan on seeing her back in roughly a month's time.  If the cefadroxil is helpful at reducing the drainage from her wound and her overall symptoms and inflammatory markers would likely give her protracted oral therapy with follow-up.  Penicillin allergy: Do not think I would rechallenge her with a penicillin given the history of what sounds like anaphylaxis nearly needing a tracheostomy  I spent 44 minutes with the patient including than 50% of the time in face to face counseling of the patient and her prior bacteremia with Proteus lumbar wound infection concern for hardware complicating bone infection at  surgical site penicillin allergy  along with review of medical records in preparation for the visit and during the visit and in coordination of her care.

## 2022-06-08 LAB — COMPLETE METABOLIC PANEL WITH GFR
AG Ratio: 1.2 (calc) (ref 1.0–2.5)
ALT: 8 U/L (ref 6–29)
AST: 11 U/L (ref 10–35)
Albumin: 4.2 g/dL (ref 3.6–5.1)
Alkaline phosphatase (APISO): 94 U/L (ref 37–153)
BUN/Creatinine Ratio: 17 (calc) (ref 6–22)
BUN: 34 mg/dL — ABNORMAL HIGH (ref 7–25)
CO2: 16 mmol/L — ABNORMAL LOW (ref 20–32)
Calcium: 9.9 mg/dL (ref 8.6–10.4)
Chloride: 108 mmol/L (ref 98–110)
Creat: 2.06 mg/dL — ABNORMAL HIGH (ref 0.60–0.95)
Globulin: 3.5 g/dL (calc) (ref 1.9–3.7)
Glucose, Bld: 141 mg/dL — ABNORMAL HIGH (ref 65–99)
Potassium: 4.9 mmol/L (ref 3.5–5.3)
Sodium: 137 mmol/L (ref 135–146)
Total Bilirubin: 0.3 mg/dL (ref 0.2–1.2)
Total Protein: 7.7 g/dL (ref 6.1–8.1)
eGFR: 23 mL/min/{1.73_m2} — ABNORMAL LOW (ref >=60)

## 2022-06-08 LAB — CBC WITH DIFFERENTIAL/PLATELET
Absolute Monocytes: 818 cells/uL (ref 200–950)
Basophils Absolute: 91 cells/uL (ref 0–200)
Basophils Relative: 0.9 %
Eosinophils Absolute: 697 cells/uL — ABNORMAL HIGH (ref 15–500)
Eosinophils Relative: 6.9 %
HCT: 37 % (ref 35.0–45.0)
Hemoglobin: 12.1 g/dL (ref 11.7–15.5)
Lymphs Abs: 2677 cells/uL (ref 850–3900)
MCH: 30.2 pg (ref 27.0–33.0)
MCHC: 32.7 g/dL (ref 32.0–36.0)
MCV: 92.3 fL (ref 80.0–100.0)
MPV: 9.7 fL (ref 7.5–12.5)
Monocytes Relative: 8.1 %
Neutro Abs: 5818 cells/uL (ref 1500–7800)
Neutrophils Relative %: 57.6 %
Platelets: 355 10*3/uL (ref 140–400)
RBC: 4.01 10*6/uL (ref 3.80–5.10)
RDW: 13.3 % (ref 11.0–15.0)
Total Lymphocyte: 26.5 %
WBC: 10.1 10*3/uL (ref 3.8–10.8)

## 2022-06-08 LAB — C-REACTIVE PROTEIN: CRP: 5 mg/L (ref <8.0)

## 2022-06-08 LAB — SEDIMENTATION RATE: Sed Rate: 48 mm/h — ABNORMAL HIGH (ref 0–30)

## 2022-06-12 ENCOUNTER — Telehealth: Payer: Self-pay

## 2022-06-12 ENCOUNTER — Other Ambulatory Visit: Payer: Self-pay | Admitting: Infectious Disease

## 2022-06-12 MED ORDER — CEFADROXIL 500 MG PO CAPS: 500.0000 mg | ORAL_CAPSULE | Freq: Two times a day (BID) | ORAL | 11 refills | Status: DC

## 2022-06-12 NOTE — Telephone Encounter (Signed)
-----   Message from Truman Hayward, MD sent at 06/12/2022  9:02 AM EDT ----- Regarding: I sent in rx for Cefadroxil for her Sent in prescription for cefadroxil for her I was waiting to get her kidney function labs back prior to sending in can you make sure that I sent it to the correct pharmacy for the patient and that she is aware to pick it up ----- Message ----- From: [facility] Results In Sent: 06/07/2022  11:28 PM EDT To: Truman Hayward, MD

## 2022-06-12 NOTE — Telephone Encounter (Signed)
Spoke with patient, notified her that cefadroxil has been sent and confirmed pharmacy. Relayed appointment info for 4/29. Patient verbalized understanding and has no further questions.   Beryle Flock, RN

## 2022-06-30 ENCOUNTER — Other Ambulatory Visit: Payer: Self-pay | Admitting: Internal Medicine

## 2022-06-30 DIAGNOSIS — Z1231 Encounter for screening mammogram for malignant neoplasm of breast: Secondary | ICD-10-CM

## 2022-07-05 ENCOUNTER — Ambulatory Visit: Payer: Medicare Other

## 2022-07-07 NOTE — Progress Notes (Signed)
Remote pacemaker transmission.   

## 2022-07-10 ENCOUNTER — Encounter: Payer: Self-pay | Admitting: Infectious Disease

## 2022-07-10 ENCOUNTER — Other Ambulatory Visit: Payer: Self-pay

## 2022-07-10 ENCOUNTER — Ambulatory Visit (INDEPENDENT_AMBULATORY_CARE_PROVIDER_SITE_OTHER): Payer: Medicare Other | Admitting: Infectious Disease

## 2022-07-10 VITALS — BP 152/90 | HR 71 | Temp 97.1°F | Ht 64.5 in | Wt 204.0 lb

## 2022-07-10 DIAGNOSIS — Z880 Allergy status to penicillin: Secondary | ICD-10-CM

## 2022-07-10 DIAGNOSIS — R7881 Bacteremia: Secondary | ICD-10-CM | POA: Diagnosis not present

## 2022-07-10 DIAGNOSIS — T847XXD Infection and inflammatory reaction due to other internal orthopedic prosthetic devices, implants and grafts, subsequent encounter: Secondary | ICD-10-CM | POA: Diagnosis not present

## 2022-07-10 DIAGNOSIS — M4646 Discitis, unspecified, lumbar region: Secondary | ICD-10-CM

## 2022-07-10 DIAGNOSIS — B964 Proteus (mirabilis) (morganii) as the cause of diseases classified elsewhere: Secondary | ICD-10-CM | POA: Diagnosis not present

## 2022-07-10 DIAGNOSIS — L299 Pruritus, unspecified: Secondary | ICD-10-CM | POA: Insufficient documentation

## 2022-07-10 HISTORY — DX: Pruritus, unspecified: L29.9

## 2022-07-10 MED ORDER — CEFDINIR 300 MG PO CAPS: 300.0000 mg | ORAL_CAPSULE | Freq: Every day | ORAL | 3 refills | Status: DC

## 2022-07-10 NOTE — Progress Notes (Signed)
Chief complaint: intense itching after having been on cefadroxil  Subjective:    Patient ID: Summer Hawkins, female    DOB: [birth year], 88 y.o.   MRN: [id number]  HPI  Summer Hawkins is an 88 year old woman with hx of surgery in September of 2022  Bilateral L3-4 and L4-5 laminotomy/foraminotomies/medial facetectomy to decompress the bilateral L3, L4 and L5 nerve roots(the work required to do this was in addition to the work required to do the posterior lumbar interbody fusion because of the patient's spinal stenosis, facet arthropathy. Etc. requiring a wide decompression of the nerve roots.);  L3-4 and L4-5 transforaminal lumbar interbody fusion with Proteo Os, local morselized autograft bone and Zimmer DBM; insertion of interbody prosthesis at L3-4 and L4-5 (globus peek expandable interbody prosthesis); posterior segmental instrumentation from L3 to L5 with globus titanium pedicle screws and rods; posterior lateral arthrodesis at L3-4 and L4-5 with Proteo OS, local morselized autograft bone and Zimmer DBM.   Shortly after surgery she was readmitted to the hospital with Proteus bacteremia.  Neurosurgery at the time did not suspect infection at the surgical site.  She received the antibiotics followed by oral cefadroxil to complete 14 days.  She was then readmitted with drainage from her lumbar wound.  She went back to the operating room on 12/20/2020 and underwent  Incision and drainage of wound with placement of wound VAC .  Operative cultures and singly yielded Proteus which was the same bacteria that was in her blood.  She was discharged on 6 weeks of cefazolin but not seen by [place] and infectious ease since then.  She did have further I&D with neurosurgery and Dr. Lovell Sheehan on overnight 2023.  I do not see that new cultures were obtained.  She has had continued drainage from the site ever since and then various courses of antibiotics including doxycycline and clarithromycin.  Dr. Lovell Sheehan is  concerned that there is a fistula between the wound and surgical site.  I would assume that the hardware is likely infected.  Unfortunately the macrolides and tetracyclines that she has been given would not have activity against Proteus.  Dr. Lovell Sheehan referred her back to [place] for follow-up.  She does not have a follow-up with him in the next few weeks but was told to follow-up in roughly a month.  I would like to get labs on her and trial her on cefadroxil.  In reviewing her penicillin allergy she tells me that she licked some penicillins from a spoon that she had given her child 50 years ago and that she developed swelling of her lips.  [facility] the [facility] and ultimately was sent to the ER where she was told they would need to perform a tracheostomy it sounds as if she was given epinephrine with resolution of what sounds like angioedema.  Has tolerated first-generation cephalosporins recently when she was treated with cefazolin  Is able to get her on cefadroxil and she has been taking this.  Fortunately while she was on cefadroxil she had intense pruritus all over her body which continued the entire time she was taking antibiotic and actually still going on now though a lesser intensity.   Drainage from her wound did not improve on the antibiotics and still is persisting.  Back pain is not something that really bothers her very much  Past Medical History:  Diagnosis Date   Atrial fibrillation (HCC)    Breast cancer (HCC)    Breast cancer, left (HCC)  Chronic kidney disease    stage III - patient was unaware   Diabetes mellitus    Dyspnea    Dysrhythmia    Afib   H/O: hysterectomy    History of colonoscopy 01/25/2010   History of mammogram 08/04/2009   Hyperlipidemia    Hypertension    Hypothyroidism    Ketoacidosis, diabetic, no coma, non-insulin dependent    Type II   Presence of permanent cardiac pacemaker    Vitamin B12 deficiency     Past Surgical History:   Procedure Laterality Date   ABDOMINAL HYSTERECTOMY     BACK SURGERY     Bilateral foot surgery     BREAST EXCISIONAL BIOPSY Left 12/2015   BREAST LUMPECTOMY Left    2017   BREAST LUMPECTOMY WITH RADIOACTIVE SEED AND SENTINEL LYMPH NODE BIOPSY Left 01/19/2016   Procedure: LEFT BREAST LUMPECTOMY WITH RADIOACTIVE SEED AND SENTINEL LYMPH NODE BIOPSY;  Surgeon: Chevis Pretty III, MD;  Location: [facility] OR;  Service: General;  Laterality: Left;   BREAST LUMPECTOMY WITH RADIOACTIVE SEED LOCALIZATION Left 01/19/2016   CARDIOVERSION  02/27/2011   Procedure: CARDIOVERSION;  Surgeon: Marca Ancona, MD;  Location: [facility] OR;  Service: Cardiovascular;  Laterality: N/A;   INSERT / REPLACE / REMOVE PACEMAKER     LAPAROSCOPIC CHOLECYSTECTOMY     LUMBAR WOUND DEBRIDEMENT N/A 12/20/2020   Procedure: LUMBAR WOUND DEBRIDEMENT WITH PLACEMENTOF LUMBAR WOUND VAC;  Surgeon: Tressie Stalker, MD;  Location: [facility] OR;  Service: Neurosurgery;  Laterality: N/A;   PACEMAKER IMPLANT N/A 12/02/2018   Procedure: PACEMAKER IMPLANT;  Surgeon: Marinus Maw, MD;  Location: [facility] INVASIVE CV LAB;  Service: Cardiovascular;  Laterality: N/A;   ROTATOR CUFF REPAIR Left    TUBAL LIGATION     WOUND EXPLORATION N/A 01/19/2022   Procedure: REVISION OF LUMBAR WOUND;  Surgeon: Tressie Stalker, MD;  Location: [facility] OR;  Service: Neurosurgery;  Laterality: N/A;  3C    Family History  Problem Relation Age of Onset   Heart failure Father 64       enlarged heart   Pneumonia Mother 26   Diabetes Mother 53   Breast cancer Sister    Cancer Neg Hx       Social History   Socioeconomic History   Marital status: Widowed    Spouse name: Not on file   Number of children: 2   Years of education: Not on file   Highest education level: Not on file  Occupational History    Employer: OTHER    Comment: Worked at [employer]  Tobacco Use   Smoking status: Never    Passive exposure: Never   Smokeless tobacco: Never  Vaping Use   Vaping Use: Never used   Substance and Sexual Activity   Alcohol use: No    Alcohol/week: 0.0 standard drinks of alcohol   Drug use: No   Sexual activity: Not on file  Other Topics Concern   Not on file  Social History Narrative   Patient since 35   Husband with prostate cancer   10-siblings-no cancer   Social Determinants of Health   Financial Resource Strain: Not on file  Food Insecurity: No Food Insecurity (01/19/2022)   Hunger Vital Sign    Worried About Running Out of Food in the Last Year: Never true    Ran Out of Food in the Last Year: Never true  Transportation Needs: No Transportation Needs (01/19/2022)   PRAPARE - Administrator, Civil Service (Medical): No  Lack of Transportation (Non-Medical): No  Physical Activity: Not on file  Stress: Not on file  Social Connections: Not on file    Allergies  Allergen Reactions   Bee Venom Shortness Of Breath, Nausea And Vomiting and Other (See Comments)    Makes the patient feel faint, also   Penicillins Anaphylaxis, Hives, Swelling and Other (See Comments)    Has patient had a PCN reaction causing immediate rash, facial/tongue/throat swelling, SOB or lightheadedness with hypotension: Yes Has patient had a PCN reaction causing severe rash involving mucus membranes or skin necrosis: No Has patient had a PCN reaction that required hospitalization: No Has patient had a PCN reaction occurring within the last 10 years: No If all of the above answers are "NO", then may proceed with Cephalosporin use.    Lisinopril Cough   Zocor [Simvastatin] Other (See Comments)    memory changes   Liraglutide Rash and Other (See Comments)    Rash at injection site     Current Outpatient Medications:    amLODipine (NORVASC) 5 MG tablet, Take 1 tablet (5 mg total) by mouth in the morning., Disp: 90 tablet, Rfl: 3   apixaban (ELIQUIS) 2.5 MG TABS tablet, Take 2.5 mg by mouth 2 (two) times daily., Disp: , Rfl:    cefadroxil (DURICEF) 500 MG capsule, Take  1 capsule (500 mg total) by mouth 2 (two) times daily., Disp: 60 capsule, Rfl: 11   Cyanocobalamin (B-12 PO), Take 1 tablet by mouth daily., Disp: , Rfl:    ferrous sulfate 325 (65 FE) MG tablet, Take 325 mg by mouth daily with breakfast., Disp: , Rfl:    insulin aspart (NOVOLOG) 100 UNIT/ML injection, 0-15 Units, Subcutaneous, 3 times daily with meals, CBG < 70: Implement Hypoglycemia measures CBG 70 - 120: 0 units CBG 121 - 150: 2 units CBG 151 - 200: 3 units CBG 201 - 250: 5 units CBG 251 - 300: 8 units CBG 301 - 350: 11 units CBG 351 - 400: 15 units CBG > 400: call MD, Disp: 10 mL, Rfl: 11   irbesartan (AVAPRO) 300 MG tablet, Take 300 mg by mouth daily., Disp: , Rfl:    levothyroxine (SYNTHROID) 137 MCG tablet, Take 137 mcg by mouth daily before breakfast., Disp: , Rfl:    metFORMIN (GLUCOPHAGE) 850 MG tablet, Take 850 mg by mouth 2 (two) times daily with a meal., Disp: , Rfl:    metoprolol succinate (TOPROL-XL) 25 MG 24 hr tablet, TAKE 1 TABLET BY MOUTH ONCE  DAILY, Disp: 90 tablet, Rfl: 1   Multiple Vitamins-Minerals (MULTIVITAMIN WITH MINERALS) tablet, Take 1 tablet by mouth in the morning., Disp: , Rfl:    spironolactone (ALDACTONE) 25 MG tablet, Take 25 mg by mouth in the morning., Disp: , Rfl:    timolol (TIMOPTIC) 0.5 % ophthalmic solution, Place 1 drop into both eyes every morning., Disp: , Rfl:    TOUJEO SOLOSTAR 300 UNIT/ML Solostar Pen, Inject 10-15 Units into the skin See admin instructions. Inject 15 units into the skin in the morning and then inject 10 units at night per patient, Disp: , Rfl:     Review of Systems  Constitutional:  Negative for activity change, appetite change, chills, diaphoresis, fatigue, fever and unexpected weight change.  HENT:  Negative for congestion, rhinorrhea, sinus pressure, sneezing, sore throat and trouble swallowing.   Eyes:  Negative for photophobia and visual disturbance.  Respiratory:  Negative for cough, chest tightness, shortness of breath,  wheezing and stridor.   Cardiovascular:  Negative for chest pain, palpitations and leg swelling.  Gastrointestinal:  Negative for abdominal distention, abdominal pain, anal bleeding, blood in stool, constipation, diarrhea, nausea and vomiting.  Genitourinary:  Negative for difficulty urinating, dysuria, flank pain and hematuria.  Musculoskeletal:  Negative for arthralgias, back pain, gait problem, joint swelling and myalgias.  Skin:  Positive for wound. Negative for color change, pallor and rash.  Neurological:  Negative for dizziness, tremors, weakness, light-headedness and headaches.  Hematological:  Negative for adenopathy. Does not bruise/bleed easily.  Psychiatric/Behavioral:  Negative for agitation, behavioral problems, confusion, decreased concentration, dysphoric mood, sleep disturbance and suicidal ideas.        Objective:   Physical Exam Constitutional:      General: She is not in acute distress.    Appearance: She is not diaphoretic.  HENT:     Head: Normocephalic and atraumatic.     Right Ear: External ear normal.     Left Ear: External ear normal.     Nose: Nose normal.     Mouth/Throat:     Pharynx: No oropharyngeal exudate.  Eyes:     General: No scleral icterus.       Right eye: No discharge.        Left eye: No discharge.     Extraocular Movements: Extraocular movements intact.     Conjunctiva/sclera: Conjunctivae normal.  Cardiovascular:     Rate and Rhythm: Normal rate and regular rhythm.  Pulmonary:     Effort: Pulmonary effort is normal. No respiratory distress.     Breath sounds: No wheezing or rales.  Abdominal:     General: There is no distension.     Palpations: Abdomen is soft.     Tenderness: There is no rebound.  Musculoskeletal:        General: No tenderness. Normal range of motion.     Cervical back: Normal range of motion and neck supple.  Lymphadenopathy:     Cervical: No cervical adenopathy.  Skin:    General: Skin is warm and dry.      Coloration: Skin is not jaundiced or pale.     Findings: No erythema, lesion or rash.  Neurological:     General: No focal deficit present.     Mental Status: She is alert and oriented to person, place, and time.     Coordination: Coordination normal.  Psychiatric:        Mood and Affect: Mood normal.        Behavior: Behavior normal.        Thought Content: Thought content normal.        Judgment: Judgment normal.     Wound 06/07/2022:       Assessment & Plan:  Postoperative chronic wound after lumbar fusion with hardware and Proteus and isolated blood cultures then from lumbar wound:  I have concern for deep-seated infection involving hardware.  She did not tolerate the cefadroxil--if this was causing her pruriritis  I will trial her on cefdinir which is slightly different cephalosporin but have her wait a week before starting it  I dont think bactrim or FQ would be safef option for her  Pruritus: appears related to her cefadroxil though in the past she tolerated this  I have personally spent 32 minutes involved in face-to-face and non-face-to-face activities for this patient on the day of the visit. Professional time spent includes the following activities: Preparing to see the patient (review of tests), Obtaining and/or reviewing separately obtained history (admission/discharge record), Performing  a medically appropriate examination and/or evaluation , Ordering medications/tests/procedures, referring and communicating with other health care professionals, Documenting clinical information in the EMR, Independently interpreting results (not separately reported), Communicating results to the patient/family/caregiver, Counseling and educating the patient/family/caregiver and Care coordination (not separately reported).

## 2022-07-24 ENCOUNTER — Telehealth: Payer: Self-pay | Admitting: *Deleted

## 2022-07-24 ENCOUNTER — Encounter: Payer: Self-pay | Admitting: *Deleted

## 2022-07-24 NOTE — Telephone Encounter (Signed)
Tct to Arya for follow on seeping lower back wound. Plan made to visit this week and look at the incision.  Will also talk to her about hurs on her back versus hours up off wound.  Will look into her protein intake to see if she is getting enough to promote wound and tissue healing.

## 2022-07-25 ENCOUNTER — Encounter: Payer: Self-pay | Admitting: *Deleted

## 2022-07-25 NOTE — Congregational Nurse Program (Signed)
Home visit for wound care visit. Patient is 5-6 months pout from spinal surgery.  Incision appears well healed.  Small area of oozing clear sanguineous drainage is noted at the top of the incision. Denies any headache or fever.  Surgeon is aware of drainage.  We spoke about her intake of protein and a balanced diet.  She eats a very balanced diet and is able to keep the diabetic aspect of her condition in control. She is being active and does have a good support system.  Has a housekeeper and friends that checks on her daily.  She has a good relationship with the church family and does call on those that she know well to assist her with appointments.

## 2022-08-09 ENCOUNTER — Ambulatory Visit: Payer: Self-pay | Admitting: Infectious Disease

## 2022-08-10 ENCOUNTER — Ambulatory Visit: Payer: Medicare Other

## 2022-08-23 DIAGNOSIS — H353133 Nonexudative age-related macular degeneration, bilateral, advanced atrophic without subfoveal involvement: Secondary | ICD-10-CM | POA: Diagnosis not present

## 2022-08-23 DIAGNOSIS — H4720 Unspecified optic atrophy: Secondary | ICD-10-CM | POA: Diagnosis not present

## 2022-08-23 DIAGNOSIS — E113393 Type 2 diabetes mellitus with moderate nonproliferative diabetic retinopathy without macular edema, bilateral: Secondary | ICD-10-CM | POA: Diagnosis not present

## 2022-08-23 DIAGNOSIS — H43813 Vitreous degeneration, bilateral: Secondary | ICD-10-CM | POA: Diagnosis not present

## 2022-08-23 DIAGNOSIS — H401133 Primary open-angle glaucoma, bilateral, severe stage: Secondary | ICD-10-CM | POA: Diagnosis not present

## 2022-08-28 ENCOUNTER — Ambulatory Visit (INDEPENDENT_AMBULATORY_CARE_PROVIDER_SITE_OTHER): Payer: Medicare Other | Admitting: Infectious Disease

## 2022-08-28 ENCOUNTER — Encounter: Payer: Self-pay | Admitting: Infectious Disease

## 2022-08-28 ENCOUNTER — Other Ambulatory Visit: Payer: Self-pay

## 2022-08-28 ENCOUNTER — Ambulatory Visit (INDEPENDENT_AMBULATORY_CARE_PROVIDER_SITE_OTHER): Payer: Medicare Other

## 2022-08-28 VITALS — BP 131/77 | HR 74 | Temp 98.0°F | Ht 64.0 in | Wt 200.0 lb

## 2022-08-28 DIAGNOSIS — E1122 Type 2 diabetes mellitus with diabetic chronic kidney disease: Secondary | ICD-10-CM | POA: Diagnosis not present

## 2022-08-28 DIAGNOSIS — T847XXD Infection and inflammatory reaction due to other internal orthopedic prosthetic devices, implants and grafts, subsequent encounter: Secondary | ICD-10-CM

## 2022-08-28 DIAGNOSIS — N183 Chronic kidney disease, stage 3 unspecified: Secondary | ICD-10-CM

## 2022-08-28 DIAGNOSIS — I5032 Chronic diastolic (congestive) heart failure: Secondary | ICD-10-CM

## 2022-08-28 DIAGNOSIS — Z794 Long term (current) use of insulin: Secondary | ICD-10-CM | POA: Diagnosis not present

## 2022-08-28 DIAGNOSIS — L7682 Other postprocedural complications of skin and subcutaneous tissue: Secondary | ICD-10-CM | POA: Diagnosis not present

## 2022-08-28 DIAGNOSIS — T847XXA Infection and inflammatory reaction due to other internal orthopedic prosthetic devices, implants and grafts, initial encounter: Secondary | ICD-10-CM

## 2022-08-28 DIAGNOSIS — E119 Type 2 diabetes mellitus without complications: Secondary | ICD-10-CM

## 2022-08-28 DIAGNOSIS — A498 Other bacterial infections of unspecified site: Secondary | ICD-10-CM | POA: Diagnosis not present

## 2022-08-28 HISTORY — DX: Infection and inflammatory reaction due to other internal orthopedic prosthetic devices, implants and grafts, initial encounter: T84.7XXA

## 2022-08-28 HISTORY — DX: Other bacterial infections of unspecified site: A49.8

## 2022-08-28 MED ORDER — CEFDINIR 300 MG PO CAPS: 300.0000 mg | ORAL_CAPSULE | Freq: Every day | ORAL | 5 refills | Status: DC

## 2022-08-28 NOTE — Progress Notes (Signed)
Chief complaint: Follow-up on chronic wound that continues to drain s   Patient ID: Summer Hawkins, female    DOB: 09-16-1934, 88 y.o.   MRN: [id number]  HPI  Paola is an 88 year old woman with hx of surgery in September of 2022  Bilateral L3-4 and L4-5 laminotomy/foraminotomies/medial facetectomy to decompress the bilateral L3, L4 and L5 nerve roots(the work required to do this was in addition to the work required to do the posterior lumbar interbody fusion because of the patient's spinal stenosis, facet arthropathy. Etc. requiring a wide decompression of the nerve roots.);  L3-4 and L4-5 transforaminal lumbar interbody fusion with Proteo Os, local morselized autograft bone and Zimmer DBM; insertion of interbody prosthesis at L3-4 and L4-5 (globus peek expandable interbody prosthesis); posterior segmental instrumentation from L3 to L5 with globus titanium pedicle screws and rods; posterior lateral arthrodesis at L3-4 and L4-5 with Proteo OS, local morselized autograft bone and Zimmer DBM.   Shortly after surgery she was readmitted to the hospital with Proteus bacteremia.  Neurosurgery at the time did not suspect infection at the surgical site.  She received the antibiotics followed by oral cefadroxil to complete 14 days.  She was then readmitted with drainage from her lumbar wound.  She went back to the operating room on 12/20/2020 and underwent  Incision and drainage of wound with placement of wound VAC .  Operative cultures and singly yielded Proteus which was the same bacteria that was in her blood.  She was discharged on 6 weeks of cefazolin but not seen by [place] and infectious ease since then.  She did have further I&D with neurosurgery and Dr. Lovell Sheehan on overnight 2023.  I do not see that new cultures were obtained.  She has had continued drainage from the site ever since and then various courses of antibiotics including doxycycline and clarithromycin.  Dr. Lovell Sheehan is concerned that  there is a fistula between the wound and surgical site.  I would assume that the hardware is likely infected.  Unfortunately the macrolides and tetracyclines that she has been given would not have activity against Proteus.  Dr. Lovell Sheehan referred her back to [place] for follow-up.  She does not have a follow-up with him in the next few weeks but was told to follow-up in roughly a month.  I wanted at that time to get labs on her and trial her on cefadroxil.  In reviewing her penicillin allergy she tells me that she licked some penicillins from a spoon that she had given her child 50 years ago and that she developed swelling of her lips.  She went to the infirmary and ultimately was sent to the ER where she was told they would need to perform a tracheostomy it sounds as if she was given epinephrine with resolution of what sounds like angioedema.  Has tolerated first-generation cephalosporins recently when she was treated with cefazolin  Is able to get her on cefadroxil and she has been taking this.  Unfortunately while she was on cefadroxil she had intense pruritus all over her body which continued the entire time she was taking antibiotic and actually still going on now though a lesser intensity.   Drainage from her wound did not improve on the antibiotics and still persisted  Back pain is not something that really bothers her very much  We switched her over to cefdinir and she has some slight itching but not nearly to the degree she had with cefadroxil she continues have drainage  from the wound in her back and is made appointment with wound care.  She denies having much back pain whatsoever unless she bends over and stands up too quickly.  He is concerned that her immune system was "destroyed" which apparently physician told her while she was in the hospital.  She had a quite protracted stay in balled her being in the hospital for 5 months she tells me.  Past Medical History:  Diagnosis Date    Atrial fibrillation (HCC)    Breast cancer (HCC)    Breast cancer, left (HCC)    Chronic kidney disease    stage III - patient was unaware   Diabetes mellitus    Dyspnea    Dysrhythmia    Afib   H/O: hysterectomy    History of colonoscopy 01/25/2010   History of mammogram 08/04/2009   Hyperlipidemia    Hypertension    Hypothyroidism    Ketoacidosis, diabetic, no coma, non-insulin dependent    Type II   Presence of permanent cardiac pacemaker    Pruritus 07/10/2022   Vitamin B12 deficiency     Past Surgical History:  Procedure Laterality Date   ABDOMINAL HYSTERECTOMY     BACK SURGERY     Bilateral foot surgery     BREAST EXCISIONAL BIOPSY Left 12/2015   BREAST LUMPECTOMY Left    2017   BREAST LUMPECTOMY WITH RADIOACTIVE SEED AND SENTINEL LYMPH NODE BIOPSY Left 01/19/2016   Procedure: LEFT BREAST LUMPECTOMY WITH RADIOACTIVE SEED AND SENTINEL LYMPH NODE BIOPSY;  Surgeon: Chevis Pretty III, MD;  Location: [facility] OR;  Service: General;  Laterality: Left;   BREAST LUMPECTOMY WITH RADIOACTIVE SEED LOCALIZATION Left 01/19/2016   CARDIOVERSION  02/27/2011   Procedure: CARDIOVERSION;  Surgeon: Marca Ancona, MD;  Location: [facility] OR;  Service: Cardiovascular;  Laterality: N/A;   INSERT / REPLACE / REMOVE PACEMAKER     LAPAROSCOPIC CHOLECYSTECTOMY     LUMBAR WOUND DEBRIDEMENT N/A 12/20/2020   Procedure: LUMBAR WOUND DEBRIDEMENT WITH PLACEMENTOF LUMBAR WOUND VAC;  Surgeon: Tressie Stalker, MD;  Location: [facility] OR;  Service: Neurosurgery;  Laterality: N/A;   PACEMAKER IMPLANT N/A 12/02/2018   Procedure: PACEMAKER IMPLANT;  Surgeon: Marinus Maw, MD;  Location: [facility] INVASIVE CV LAB;  Service: Cardiovascular;  Laterality: N/A;   ROTATOR CUFF REPAIR Left    TUBAL LIGATION     WOUND EXPLORATION N/A 01/19/2022   Procedure: REVISION OF LUMBAR WOUND;  Surgeon: Tressie Stalker, MD;  Location: [facility] OR;  Service: Neurosurgery;  Laterality: N/A;  3C    Family History  Problem Relation Age of Onset    Heart failure Father 57       enlarged heart   Pneumonia Mother 43   Diabetes Mother 25   Breast cancer Sister    Cancer Neg Hx       Social History   Socioeconomic History   Marital status: Widowed    Spouse name: Not on file   Number of children: 2   Years of education: Not on file   Highest education level: Not on file  Occupational History    Employer: OTHER    Comment: Worked at [employer]  Tobacco Use   Smoking status: Never    Passive exposure: Never   Smokeless tobacco: Never  Vaping Use   Vaping Use: Never used  Substance and Sexual Activity   Alcohol use: No    Alcohol/week: 0.0 standard drinks of alcohol   Drug use: No   Sexual activity: Not on file  Other Topics Concern   Not on file  Social History Narrative   Patient since 82   Husband with prostate cancer   10-siblings-no cancer   Social Determinants of Health   Financial Resource Strain: Not on file  Food Insecurity: No Food Insecurity (01/19/2022)   Hunger Vital Sign    Worried About Running Out of Food in the Last Year: Never true    Ran Out of Food in the Last Year: Never true  Transportation Needs: No Transportation Needs (01/19/2022)   PRAPARE - Administrator, Civil Service (Medical): No    Lack of Transportation (Non-Medical): No  Physical Activity: Not on file  Stress: Not on file  Social Connections: Not on file    Allergies  Allergen Reactions   Bee Venom Shortness Of Breath, Nausea And Vomiting and Other (See Comments)    Makes the patient feel faint, also   Penicillins Anaphylaxis, Hives, Swelling and Other (See Comments)    Has patient had a PCN reaction causing immediate rash, facial/tongue/throat swelling, SOB or lightheadedness with hypotension: Yes Has patient had a PCN reaction causing severe rash involving mucus membranes or skin necrosis: No Has patient had a PCN reaction that required hospitalization: No Has patient had a PCN reaction occurring within the last  10 years: No If all of the above answers are "NO", then may proceed with Cephalosporin use.    Lisinopril Cough   Zocor [Simvastatin] Other (See Comments)    memory changes   Liraglutide Rash and Other (See Comments)    Rash at injection site     Current Outpatient Medications:    amLODipine (NORVASC) 5 MG tablet, Take 1 tablet (5 mg total) by mouth in the morning., Disp: 90 tablet, Rfl: 3   apixaban (ELIQUIS) 2.5 MG TABS tablet, Take 2.5 mg by mouth 2 (two) times daily., Disp: , Rfl:    cefdinir (OMNICEF) 300 MG capsule, Take 1 capsule (300 mg total) by mouth daily., Disp: 30 capsule, Rfl: 3   Cyanocobalamin (B-12 PO), Take 1 tablet by mouth daily., Disp: , Rfl:    ferrous sulfate 325 (65 FE) MG tablet, Take 325 mg by mouth daily with breakfast., Disp: , Rfl:    insulin aspart (NOVOLOG) 100 UNIT/ML injection, 0-15 Units, Subcutaneous, 3 times daily with meals, CBG < 70: Implement Hypoglycemia measures CBG 70 - 120: 0 units CBG 121 - 150: 2 units CBG 151 - 200: 3 units CBG 201 - 250: 5 units CBG 251 - 300: 8 units CBG 301 - 350: 11 units CBG 351 - 400: 15 units CBG > 400: call MD, Disp: 10 mL, Rfl: 11   irbesartan (AVAPRO) 300 MG tablet, Take 300 mg by mouth daily., Disp: , Rfl:    levothyroxine (SYNTHROID) 137 MCG tablet, Take 137 mcg by mouth daily before breakfast., Disp: , Rfl:    metFORMIN (GLUCOPHAGE) 850 MG tablet, Take 850 mg by mouth 2 (two) times daily with a meal., Disp: , Rfl:    metoprolol succinate (TOPROL-XL) 25 MG 24 hr tablet, TAKE 1 TABLET BY MOUTH ONCE  DAILY, Disp: 90 tablet, Rfl: 1   Multiple Vitamins-Minerals (MULTIVITAMIN WITH MINERALS) tablet, Take 1 tablet by mouth in the morning., Disp: , Rfl:    spironolactone (ALDACTONE) 25 MG tablet, Take 25 mg by mouth in the morning., Disp: , Rfl:    timolol (TIMOPTIC) 0.5 % ophthalmic solution, Place 1 drop into both eyes every morning., Disp: , Rfl:    TOUJEO SOLOSTAR  300 UNIT/ML Solostar Pen, Inject 10-15 Units into the skin  See admin instructions. Inject 15 units into the skin in the morning and then inject 10 units at night per patient, Disp: , Rfl:     Review of Systems  Constitutional:  Negative for activity change, appetite change, chills, diaphoresis, fatigue, fever and unexpected weight change.  HENT:  Negative for congestion, rhinorrhea, sinus pressure, sneezing, sore throat and trouble swallowing.   Eyes:  Negative for photophobia and visual disturbance.  Respiratory:  Negative for cough, chest tightness, shortness of breath, wheezing and stridor.   Cardiovascular:  Negative for chest pain, palpitations and leg swelling.  Gastrointestinal:  Negative for abdominal distention, abdominal pain, anal bleeding, blood in stool, constipation, diarrhea, nausea and vomiting.  Genitourinary:  Negative for difficulty urinating, dysuria, flank pain and hematuria.  Musculoskeletal:  Negative for arthralgias, back pain, gait problem, joint swelling and myalgias.  Skin:  Positive for wound. Negative for color change, pallor and rash.  Neurological:  Negative for dizziness, tremors, weakness and light-headedness.  Hematological:  Negative for adenopathy. Does not bruise/bleed easily.  Psychiatric/Behavioral:  Negative for agitation, behavioral problems, confusion, decreased concentration, dysphoric mood and sleep disturbance.        Objective:   Physical Exam Constitutional:      General: She is not in acute distress.    Appearance: She is not diaphoretic.  HENT:     Head: Normocephalic and atraumatic.     Right Ear: External ear normal.     Left Ear: External ear normal.     Nose: Nose normal.     Mouth/Throat:     Pharynx: No oropharyngeal exudate.  Eyes:     General: No scleral icterus.       Right eye: No discharge.        Left eye: No discharge.     Extraocular Movements: Extraocular movements intact.     Conjunctiva/sclera: Conjunctivae normal.  Cardiovascular:     Rate and Rhythm: Normal rate and  regular rhythm.     Heart sounds:     No friction rub.  Pulmonary:     Effort: Pulmonary effort is normal. No respiratory distress.     Breath sounds: No wheezing or rales.  Abdominal:     General: There is no distension.     Palpations: Abdomen is soft.     Tenderness: There is no rebound.  Musculoskeletal:        General: No tenderness. Normal range of motion.     Cervical back: Normal range of motion and neck supple.  Lymphadenopathy:     Cervical: No cervical adenopathy.  Skin:    General: Skin is warm and dry.     Coloration: Skin is not jaundiced or pale.     Findings: No erythema, lesion or rash.  Neurological:     General: No focal deficit present.     Mental Status: She is alert and oriented to person, place, and time.     Coordination: Coordination normal.  Psychiatric:        Mood and Affect: Mood normal.        Behavior: Behavior normal.        Thought Content: Thought content normal.        Judgment: Judgment normal.     Wound 06/07/2022:       Assessment & Plan:  Postoperative chronic wound after lumbar fusion with hardware and Proteus isolated in blood cultures in the interim lumbar wound:  I  remain concerned for deep-seated infection.  She does continue to drain material suggestive of fistula.  She does not want further neurosurgical interventions but are going to see wound care  We will continue with cefdinir I was going to check a sed rate and CRP but she did not want blood work done today so we will do this in several months time with also a BMP and CBC with differential   Pruritus: This is largely resolved after coming off of cefadroxil

## 2022-08-29 ENCOUNTER — Ambulatory Visit
Admission: RE | Admit: 2022-08-29 | Discharge: 2022-08-29 | Disposition: A | Discharge status: Home / Self Care | Payer: Medicare Other | Source: Ambulatory Visit | Attending: Internal Medicine | Admitting: Internal Medicine

## 2022-08-29 DIAGNOSIS — Z1231 Encounter for screening mammogram for malignant neoplasm of breast: Secondary | ICD-10-CM | POA: Diagnosis not present

## 2022-08-29 LAB — CUP PACEART REMOTE DEVICE CHECK
Battery Remaining Longevity: 111 mo
Battery Remaining Longevity: 111 mo
Battery Voltage: 2.99 V
Battery Voltage: 2.99 V
Brady Statistic AP VP Percent: 0.13 %
Brady Statistic AP VP Percent: 0.01 %
Brady Statistic AP VS Percent: 99.79 %
Brady Statistic AP VS Percent: 99.95 %
Brady Statistic AS VP Percent: 0 %
Brady Statistic AS VP Percent: 0 %
Brady Statistic AS VS Percent: 0.08 %
Brady Statistic AS VS Percent: 0.05 %
Brady Statistic RA Percent Paced: 99.98 %
Brady Statistic RA Percent Paced: 100 %
Brady Statistic RV Percent Paced: 0.13 %
Brady Statistic RV Percent Paced: 0.01 %
Date Time Interrogation Session: 20240616184435
Date Time Interrogation Session: 20240617093636
Implantable Lead Connection Status: 753985
Implantable Lead Connection Status: 753985
Implantable Lead Connection Status: 753985
Implantable Lead Connection Status: 753985
Implantable Lead Implant Date: 20200921
Implantable Lead Implant Date: 20200921
Implantable Lead Implant Date: 20200921
Implantable Lead Implant Date: 20200921
Implantable Lead Location: 753858
Implantable Lead Location: 753860
Implantable Lead Location: 753858
Implantable Lead Location: 753860
Implantable Lead Model: 3830
Implantable Lead Model: 5076
Implantable Lead Model: 3830
Implantable Lead Model: 5076
Implantable Pulse Generator Implant Date: 20200921
Implantable Pulse Generator Implant Date: 20200921
Lead Channel Impedance Value: 323 Ohm
Lead Channel Impedance Value: 361 Ohm
Lead Channel Impedance Value: 380 Ohm
Lead Channel Impedance Value: 494 Ohm
Lead Channel Impedance Value: 323 Ohm
Lead Channel Impedance Value: 380 Ohm
Lead Channel Impedance Value: 380 Ohm
Lead Channel Impedance Value: 494 Ohm
Lead Channel Sensing Intrinsic Amplitude: 11.5 mV
Lead Channel Sensing Intrinsic Amplitude: 11.625 mV
Lead Channel Sensing Intrinsic Amplitude: 11.625 mV
Lead Channel Sensing Intrinsic Amplitude: 17.25 mV
Lead Channel Sensing Intrinsic Amplitude: 11.5 mV
Lead Channel Sensing Intrinsic Amplitude: 11.625 mV
Lead Channel Sensing Intrinsic Amplitude: 11.625 mV
Lead Channel Sensing Intrinsic Amplitude: 17.25 mV
Lead Channel Setting Pacing Amplitude: 2 V
Lead Channel Setting Pacing Amplitude: 2.5 V
Lead Channel Setting Pacing Amplitude: 2 V
Lead Channel Setting Pacing Amplitude: 2.5 V
Lead Channel Setting Pacing Pulse Width: 0.4 ms
Lead Channel Setting Pacing Pulse Width: 0.4 ms
Lead Channel Setting Sensing Sensitivity: 1.2 mV
Lead Channel Setting Sensing Sensitivity: 1.2 mV
Zone Setting Status: 755011
Zone Setting Status: 755011
Zone Setting Status: 755011
Zone Setting Status: 755011

## 2022-09-18 NOTE — Progress Notes (Signed)
Remote pacemaker transmission.   

## 2022-09-19 DIAGNOSIS — H43813 Vitreous degeneration, bilateral: Secondary | ICD-10-CM | POA: Diagnosis not present

## 2022-09-19 DIAGNOSIS — E113393 Type 2 diabetes mellitus with moderate nonproliferative diabetic retinopathy without macular edema, bilateral: Secondary | ICD-10-CM | POA: Diagnosis not present

## 2022-09-19 DIAGNOSIS — H401133 Primary open-angle glaucoma, bilateral, severe stage: Secondary | ICD-10-CM | POA: Diagnosis not present

## 2022-09-19 DIAGNOSIS — H4720 Unspecified optic atrophy: Secondary | ICD-10-CM | POA: Diagnosis not present

## 2022-09-19 DIAGNOSIS — H353133 Nonexudative age-related macular degeneration, bilateral, advanced atrophic without subfoveal involvement: Secondary | ICD-10-CM | POA: Diagnosis not present

## 2022-09-28 ENCOUNTER — Encounter (HOSPITAL_BASED_OUTPATIENT_CLINIC_OR_DEPARTMENT_OTHER): Payer: Medicare Other | Attending: General Surgery | Admitting: General Surgery

## 2022-09-28 DIAGNOSIS — L98429 Non-pressure chronic ulcer of back with unspecified severity: Secondary | ICD-10-CM | POA: Diagnosis not present

## 2022-09-28 DIAGNOSIS — L7682 Other postprocedural complications of skin and subcutaneous tissue: Secondary | ICD-10-CM | POA: Insufficient documentation

## 2022-09-28 DIAGNOSIS — T847XXS Infection and inflammatory reaction due to other internal orthopedic prosthetic devices, implants and grafts, sequela: Secondary | ICD-10-CM | POA: Insufficient documentation

## 2022-09-28 DIAGNOSIS — X58XXXS Exposure to other specified factors, sequela: Secondary | ICD-10-CM | POA: Insufficient documentation

## 2022-09-28 DIAGNOSIS — N183 Chronic kidney disease, stage 3 unspecified: Secondary | ICD-10-CM | POA: Diagnosis not present

## 2022-09-28 DIAGNOSIS — E11622 Type 2 diabetes mellitus with other skin ulcer: Secondary | ICD-10-CM | POA: Diagnosis not present

## 2022-09-28 DIAGNOSIS — I130 Hypertensive heart and chronic kidney disease with heart failure and stage 1 through stage 4 chronic kidney disease, or unspecified chronic kidney disease: Secondary | ICD-10-CM | POA: Diagnosis not present

## 2022-09-28 DIAGNOSIS — T8189XA Other complications of procedures, not elsewhere classified, initial encounter: Secondary | ICD-10-CM | POA: Diagnosis not present

## 2022-09-28 DIAGNOSIS — E1122 Type 2 diabetes mellitus with diabetic chronic kidney disease: Secondary | ICD-10-CM | POA: Diagnosis not present

## 2022-09-28 NOTE — Progress Notes (Signed)
Summer, Hawkins ([id number]) [id number] Page 1 of 8 Visit Report for 09/28/2022 Chief Complaint Document Details Patient Name: Date of Service: Summer Hawkins, Summer Hawkins 09/28/2022 9:00 A M Medical Record Number: [id number] Patient Account Number: [id number] Date of Birth/Sex: Treating RN: 10/20/1934 (88 y.o. F) Primary Care Provider: Guerry Bruin Other Clinician: Referring Provider: Treating Provider/Extender: Lolita Rieger in Treatment: 0 Information Obtained from: Patient Chief Complaint Patient presents to the wound care center with open non-healing surgical wound(s) Electronic Signature(s) Signed: 09/28/2022 10:28:02 AM By: Duanne Guess MD FACS Previous Signature: 09/28/2022 9:26:04 AM Version By: Duanne Guess MD FACS Entered By: Duanne Guess on 09/28/2022 10:28:02 -------------------------------------------------------------------------------- HPI Details Patient Name: Date of Service: Summer Post S. 09/28/2022 9:00 A M Medical Record Number: [id number] Patient Account Number: [id number] Date of Birth/Sex: Treating RN: Mar 13, 1935 (88 y.o. F) Primary Care Provider: Guerry Bruin Other Clinician: Referring Provider: Treating Provider/Extender: Lolita Rieger in Treatment: 0 History of Present Illness HPI Description: CONSULTATION ONLY 09/28/2022 This is an 88 year old type II diabetic (no recent A1c available for review) who underwent lumbar fusion in September 2022. She had issues with wound drainage and ultimate dehiscence of the surgical site. She returned to the operating room about a month later for IandD of her wound and placement of the wound VAC. She has had persistent drainage from the incision. About a year after her initial surgery, she underwent a repeat incision and drainage to evaluate the area. She has been followed by Dr. Daiva Eves and infectious disease for some time. According  to his documentation, she is supposed to be taking cefdinir, but the patient insists that she is taking doxycycline. As far as the wound goes, she says that it rarely drains and only occasionally, she will notice a spot of serous material on her clothing. She reports that her neurosurgeon advised that she can leave it open to air at all times. Electronic Signature(s) Signed: 09/28/2022 10:30:42 AM By: Duanne Guess MD FACS Previous Signature: 09/28/2022 9:31:09 AM Version By: Duanne Guess MD FACS Entered By: Duanne Guess on 09/28/2022 10:30:41 -------------------------------------------------------------------------------- Physical Exam Details Patient Name: Date of Service: Summer Hawkins 09/28/2022 9:00 A Cato Mulligan ([id number]) [id number] Page 2 of 8 Medical Record Number: [id number] Patient Account Number: [id number] Date of Birth/Sex: Treating RN: 1934/06/05 (88 y.o. F) Primary Care Provider: Guerry Bruin Other Clinician: Referring Provider: Treating Provider/Extender: Lolita Rieger in Treatment: 0 Constitutional Hypertensive, asymptomatic. . . . No acute distress. Respiratory Normal work of breathing on room air. Notes 09/28/2022: On the patient's lumbar spine, and near the distal end of her surgical incision, there is a small pinhole into which I can barely fit a skinny probe. It extends for about 3 cm. I do not discretely appreciate hardware, but I do run into a solid surface. There is no purulent drainage, malodor, periwound erythema, or other findings to suggest an acute infection. Electronic Signature(s) Signed: 09/28/2022 10:32:29 AM By: Duanne Guess MD FACS Entered By: Duanne Guess on 09/28/2022 10:32:28 -------------------------------------------------------------------------------- Physician Orders Details Patient Name: Date of Service: Summer Post S. 09/28/2022 9:00 A M Medical Record  Number: [id number] Patient Account Number: [id number] Date of Birth/Sex: Treating RN: 1934/08/22 (88 y.o. 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 Additional Orders / Instructions Other: - continue taking antibiotics as prescribed by surgeon or infectious disease Electronic Signature(s) Signed: 09/28/2022 10:33:16 AM By: Duanne Guess MD FACS Entered By: Duanne Guess on 09/28/2022 10:33:16 Problem List Details -------------------------------------------------------------------------------- Summer Hawkins ([id number]) [id number] Page 3 of 8 Patient Name: Date of Service: HA Hawkins, Summer 09/28/2022 9:00 A M Medical Record Number: [id number] Patient Account Number: [id number] Date of Birth/Sex: Treating RN: 12/20/1934 (88 y.o. 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 09/28/2022 No Yes L76.82 Other postprocedural complications of skin  and subcutaneous tissue 09/28/2022 No Yes T84.7XXS Infection and inflammatory reaction due to other internal orthopedic prosthetic 09/28/2022 No Yes devices, implants and grafts, sequela E11.622 Type 2 diabetes mellitus with other skin ulcer 09/28/2022 No Yes N18.30 Chronic kidney disease, stage 3 unspecified 09/28/2022 No Yes Inactive Problems Resolved Problems Electronic Signature(s) Signed: 09/28/2022 10:27:00 AM By: Duanne Guess MD FACS Previous Signature: 09/28/2022 9:23:08 AM Version By: Duanne Guess MD FACS Entered By: Duanne Guess on 09/28/2022 10:27:00 -------------------------------------------------------------------------------- Progress Note Details Patient Name: Date of Service: Summer Post S. 09/28/2022 9:00 A M Medical Record Number: [id number] Patient Account Number: [id number] Date of Birth/Sex: Treating RN: 1934-06-15 (88 y.o. F) Primary Care Provider: Guerry Bruin Other Clinician: Referring Provider: Treating Provider/Extender: Lolita Rieger in Treatment: 0 Subjective Chief Complaint Information obtained from Patient Patient presents to the wound care center with open non-healing surgical wound(s) History of Present Illness (HPI) CONSULTATION ONLY 09/28/2022 This is an 88 year old type II diabetic (no recent A1c available for review) who underwent lumbar fusion in September 2022. She had issues with wound drainage and ultimate dehiscence of the surgical site. She returned to the operating room about a month later for IandD of her wound and placement of the wound VAC. She has had persistent drainage from the incision. About a year after her initial surgery, she underwent a repeat incision and drainage to evaluate the area. She has been followed by Dr. Daiva Eves and infectious disease for some time. According to his documentation, she is supposed to be taking cefdinir, but the patient insists that she is taking doxycycline. As far  as the wound goes, she says that it rarely drains and only occasionally, she will notice a spot of serous material on her clothing. She reports that her neurosurgeon advised that she can leave it open to air at all times. PILAR, WESTERGAARD ([id number]) [id number] Page 4 of 8 Patient History Information obtained from Patient, Chart. Allergies bee venom protein (honey bee) (Reaction: SOB, nausea/vomiting), penicillin (Reaction: anaphylaxis), lisinopril (Reaction: cough), Zocor, liraglutide (Reaction: rash) Family History Cancer - Siblings, Diabetes - Mother,Siblings, Heart Disease - Father,Mother, Hypertension - Siblings,Mother, Seizures - Siblings, No family history of Hereditary Spherocytosis, Kidney Disease, Lung Disease, Stroke, Thyroid Problems, Tuberculosis. Social History Never smoker, Marital Status - Widowed, Alcohol Use - Never, Drug Use - No History, Caffeine Use -  Rarely - soda. Medical History Eyes Patient has history of Cataracts - bil extraction Denies history of Glaucoma, Optic Neuritis Hematologic/Lymphatic Patient has history of Anemia Cardiovascular Patient has history of Arrhythmia - afib, Congestive Heart Failure, Hypertension Endocrine Patient has history of Type II Diabetes Denies history of Type I Diabetes Genitourinary Denies history of End Stage Renal Disease Integumentary (Skin) Denies history of History of Burn Musculoskeletal Denies history of Gout, Rheumatoid Arthritis, Osteoarthritis, Osteomyelitis Neurologic Denies history of Dementia, Neuropathy, Quadriplegia, Paraplegia, Seizure Disorder Oncologic Denies history of Received Chemotherapy, Received Radiation Psychiatric Denies history of Anorexia/bulimia, Confinement Anxiety Patient is treated with Insulin. Blood sugar is tested. Hospitalization/Surgery History - revision of lumbar wound 11/23. - lumbar wound debridement 10/22. - pacemaker implant 12/02/18. - breast  lumpectomy with radioactive seeds11/ 17. - cardioversion. - abdominal hysterectomy. - lumbar fusion with hardware. - bil foot surgery. - lap cholecystectomy. - left rotator cuff repair. - tubal ligation. Medical A Surgical History Notes nd Constitutional Symptoms (General Health) obesity Eyes macular degeneration Cardiovascular pacemaker Endocrine hypothyroidism Genitourinary CKD st 3, urinary incontinence Musculoskeletal left leg weakness Oncologic HX breast CA Review of Systems (ROS) Constitutional Symptoms (General Health) Denies complaints or symptoms of Fatigue, Fever, Chills, Marked Weight Change. Eyes Complains or has symptoms of Glasses / Contacts. Ear/Nose/Mouth/Throat Denies complaints or symptoms of Chronic sinus problems or rhinitis. Respiratory Denies complaints or symptoms of Chronic or frequent coughs, Shortness of Breath. Cardiovascular Denies complaints or symptoms of Chest pain. Gastrointestinal Denies complaints or symptoms of Frequent diarrhea, Nausea, Vomiting. Endocrine Denies complaints or symptoms of Heat/cold intolerance. Genitourinary Denies complaints or symptoms of Frequent urination. Integumentary (Skin) Complains or has symptoms of Wounds - back. Musculoskeletal Complains or has symptoms of Muscle Weakness. Denies complaints or symptoms of Muscle Pain. Neurologic Complains or has symptoms of Numbness/parasthesias - left leg. Psychiatric Denies complaints or symptoms of Claustrophobia. GRANT, HENKES ([id number]) [id number] Page 5 of 8 Objective Constitutional Hypertensive, asymptomatic. No acute distress. Vitals Time Taken: 8:53 AM, Height: 64 in, Weight: 199 lbs, BMI: 34.2, Temperature: 97.8 F, Pulse: 99 bpm, Respiratory Rate: 18 breaths/min, Blood Pressure: 156/86 mmHg, Capillary Blood Glucose: 112 mg/dl. General Notes: glucose per pt repot this am Respiratory Normal work of breathing on room  air. General Notes: 09/28/2022: On the patient's lumbar spine, and near the distal end of her surgical incision, there is a small pinhole into which I can barely fit a skinny probe. It extends for about 3 cm. I do not discretely appreciate hardware, but I do run into a solid surface. There is no purulent drainage, malodor, periwound erythema, or other findings to suggest an acute infection. Integumentary (Hair, Skin) Wound #1 status is Not Healed. Original cause of wound was Surgical Injury. The date acquired was: 08/12/2021. The wound is located on the Midline Back. The wound measures 0.2cm length x 0.2cm width x 2.8cm depth; 0.031cm^2 area and 0.088cm^3 volume. There is bone and Fat Layer (Subcutaneous Tissue) exposed. There is no tunneling or undermining noted. There is a medium amount of serosanguineous drainage noted. The wound margin is distinct with the outline attached to the wound base. There is large (67-100%) red granulation within the wound bed. There is no necrotic tissue within the wound bed. The periwound skin appearance had no abnormalities noted for texture. The periwound skin appearance had no abnormalities noted for moisture. The periwound skin appearance had no abnormalities noted for color. Periwound temperature was noted as No Abnormality. The periwound has tenderness on palpation. 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 Services: Discharge from Wound Care Center - follow up with wound care as needed Bathing/ Shower/ Hygiene: May shower and wash wound with soap and water. 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 On the patient's lumbar spine, and near the distal end of her surgical incision, there is a small pinhole into which I can barely fit a skinny probe. It extends for about 3 cm. I do not discretely appreciate hardware, but I do run into a solid surface. There is no purulent drainage, malodor, periwound erythema, or other findings to suggest an acute infection. I had a frank discussion with the patient that the wound is highly unlikely to ever heal given the presence of what I believe to be chronically infected hardware. She does not wish to undergo any further neurosurgical intervention. The wound is so small that it number dressing materials we can fit into the tract. She asked what would happen if we did nothing. I told her that certainly she can get risk for further infection, but that suppressive antibiotics could help minimize that risk. I also recommended that she keep the wound covered, then open to air, to protect it from external bacteria and that she can wash it with antibacterial soap or wound cleanser. Unfortunately, we really do not have much to offer her in the way of wound care, as this wound will not heal without hardware removal, which she is not interested in. She is very content with the concept of long-term management with antibiotic suppression as directed by either infectious disease or her neurosurgeon and covering the wound with dressing. She may follow-up as needed. Electronic Signature(s) Signed: 09/28/2022 4:59:36 PM By: Zenaida Deed RN, BSN Signed: 10/02/2022 11:13:49 AM By: Duanne Guess MD FACS Previous Signature: 09/28/2022 10:37:25 AM Version By: Duanne Guess MD FACS Entered By: Zenaida Deed on 09/28/2022 16:49:47 Summer Hawkins ([id number])  [id number] Page 6 of 8 -------------------------------------------------------------------------------- HxROS Details Patient Name: Date of Service: HA TAMYAH, CUTBIRTH 09/28/2022 9:00 A M Medical Record Number: [id number] Patient Account Number: [id number] Date of Birth/Sex: Treating RN: 1934-09-11 (88 y.o. [name] Primary Care Provider: Guerry Bruin Other Clinician: Referring Provider: Treating Provider/Extender: Lolita Rieger in Treatment: 0 Information Obtained From Patient Chart Constitutional Symptoms (General Health) Complaints and Symptoms: Negative for: Fatigue; Fever; Chills; Marked Weight Change Medical History: Past Medical History Notes: obesity Eyes Complaints and Symptoms: Positive for: Glasses / Contacts Medical History: Positive for: Cataracts - bil extraction Negative for: Glaucoma; Optic Neuritis Past Medical History Notes: macular degeneration Ear/Nose/Mouth/Throat Complaints and Symptoms: Negative for: Chronic sinus problems or rhinitis Respiratory Complaints and Symptoms: Negative for: Chronic or frequent coughs; Shortness of Breath Cardiovascular Complaints and Symptoms: Negative for: Chest pain Medical History: Positive for: Arrhythmia - afib; Congestive Heart Failure; Hypertension Past Medical History Notes: pacemaker Gastrointestinal Complaints and Symptoms: Negative for: Frequent diarrhea; Nausea; Vomiting Endocrine Complaints and Symptoms: Negative for: Heat/cold intolerance Medical History: Positive for: Type II Diabetes Negative for: Type I Diabetes Past Medical History Notes: hypothyroidism Time with  diabetes: 30 yrs Treated with: Insulin Blood sugar tested every day: Yes Tested : with dexcom Genitourinary Complaints and Symptoms: Negative for: Frequent urination CAREEN, MAUCH ([id number]) [id number] Page 7 of 8 Medical  History: Negative for: End Stage Renal Disease Past Medical History Notes: CKD st 3, urinary incontinence Integumentary (Skin) Complaints and Symptoms: Positive for: Wounds - back Medical History: Negative for: History of Burn Musculoskeletal Complaints and Symptoms: Positive for: Muscle Weakness Negative for: Muscle Pain Medical History: Negative for: Gout; Rheumatoid Arthritis; Osteoarthritis; Osteomyelitis Past Medical History Notes: left leg weakness Neurologic Complaints and Symptoms: Positive for: Numbness/parasthesias - left leg Medical History: Negative for: Dementia; Neuropathy; Quadriplegia; Paraplegia; Seizure Disorder Psychiatric Complaints and Symptoms: Negative for: Claustrophobia Medical History: Negative for: Anorexia/bulimia; Confinement Anxiety Hematologic/Lymphatic Medical History: Positive for: Anemia Immunological Oncologic Medical History: Negative for: Received Chemotherapy; Received Radiation Past Medical History Notes: HX breast CA HBO Extended History Items Eyes: Cataracts Immunizations Pneumococcal Vaccine: Received Pneumococcal Vaccination: Yes Received Pneumococcal Vaccination On or After 60th Birthday: Yes Implantable Devices No devices added Hospitalization / Surgery History Type of Hospitalization/Surgery revision of lumbar wound 11/23 lumbar wound debridement 10/22 pacemaker implant 12/02/18 breast lumpectomy with radioactive seeds11/ 17 cardioversion abdominal hysterectomy lumbar fusion with hardware bil foot surgery lap cholecystectomy WILLONA, PHARISS ([id number]) [id number] Page 8 of 8 left rotator cuff repair tubal ligation Family and Social History Cancer: Yes - Siblings; Diabetes: Yes - Mother,Siblings; Heart Disease: Yes - Father,Mother; Hereditary Spherocytosis: No; Hypertension: Yes - Siblings,Mother; Kidney Disease: No; Lung Disease: No; Seizures: Yes - Siblings; Stroke: No; Thyroid  Problems: No; Tuberculosis: No; Never smoker; Marital Status - Widowed; Alcohol Use: Never; Drug Use: No History; Caffeine Use: Rarely - soda; Financial Concerns: No; Food, Clothing or Shelter Needs: No; Support System Lacking: No; Transportation Concerns: Yes - pt does not like to drive Electronic Signature(s) Signed: 09/28/2022 4:23:46 PM By: Duanne Guess MD FACS Signed: 09/28/2022 4:59:36 PM By: Zenaida Deed RN, BSN Entered By: Zenaida Deed on 09/28/2022 09:32:29 -------------------------------------------------------------------------------- SuperBill Details Patient Name: Date of Service: Summer Hawkins. 09/28/2022 Medical Record Number: [id number] Patient Account Number: [id number] Date of Birth/Sex: Treating RN: Feb 07, 1935 (88 y.o. 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 L76.82 Other postprocedural complications of skin and subcutaneous tissue T84.7XXS Infection and inflammatory reaction due to other internal orthopedic prosthetic devices, implants and grafts, sequela E11.622 Type 2 diabetes mellitus with other skin ulcer N18.30 Chronic kidney disease, stage 3 unspecified Facility Procedures : CPT4 Code: [id number] Description: 99214 - WOUND CARE VISIT-LEV 4 EST PT Modifier: Quantity: 1 Physician Procedures : CPT4 Code Description Modifier [id number] WC PHYS LEVEL 3 NEW PT ICD-10 Diagnosis Description L98.429 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

## 2022-09-30 NOTE — Progress Notes (Signed)
SWANNIE, MILIUS ([id number]) [id number] Page 1 of 8 Visit Report for 09/28/2022 Allergy List Details Patient Name: Date of Service: Summer Hawkins, Summer Hawkins 09/28/2022 9:00 A M Medical Record Number: [id number] Patient Account Number: [id number] Date of Birth/Sex: Treating RN: Dec 21, 1934 (88 y.o. Summer Hawkins Ruchel Brandenburger: Guerry Bruin Other Clinician: Referring Benjimin Hadden: Treating Vedanth Sirico/Extender: Lolita Rieger in Treatment: 0 Allergies Active Allergies bee venom protein (honey bee) Reaction: SOB, nausea/vomiting penicillin Reaction: anaphylaxis lisinopril Reaction: cough Zocor liraglutide Reaction: rash Allergy Notes Electronic Signature(s) Signed: 09/28/2022 4:59:36 PM By: Zenaida Deed RN, BSN Entered By: Zenaida Deed on 09/28/2022 09:11:22 -------------------------------------------------------------------------------- Arrival Information Details Patient Name: Date of Service: Summer Post S. 09/28/2022 9:00 A M Medical Record Number: [id number] Patient Account Number: [id number] Date of Birth/Sex: Treating RN: 1934-09-10 (88 y.o. F) Primary Hawkins Ora Mcnatt: Guerry Bruin Other Clinician: Referring Creig Landin: Treating Kim Oki/Extender: Lolita Rieger in Treatment: 0 Visit Information Patient Arrived: Dan Humphreys Arrival Time: 08:53 Accompanied By: self Transfer Assistance: None Patient Identification Verified: Yes Secondary Verification Process Completed: Yes Patient Requires Transmission-Based Precautions: No Patient Has Alerts: No Electronic Signature(s) Signed: 09/28/2022 4:59:36 PM By: Zenaida Deed RN, BSN Entered By: Zenaida Deed on 09/28/2022 09:08:51 [name] ([id number]) [id number] Page 2 of 8 -------------------------------------------------------------------------------- Clinic Level of Hawkins Assessment Details Patient Name:  Date of Service: [name], [name] 09/28/2022 9:00 A M Medical Record Number: [id number] Patient Account Number: [id number] Date of Birth/Sex: Treating RN: [birth year] (88 y.o. Summer Hawkins [name]: Guerry Bruin Other Clinician: Referring Bekah Igoe: Treating Jhade Berko/Extender: Lolita Rieger in Treatment: 0 Clinic Level of Hawkins Assessment Items TOOL 2 Quantity Score []  - 0 Use when only an EandM is performed on the INITIAL visit ASSESSMENTS - Nursing Assessment / Reassessment X- 1 20 General Physical Exam (combine w/ comprehensive assessment (listed just below) when performed on new pt. evals) X- 1 25 Comprehensive Assessment (HX, ROS, Risk Assessments, Wounds Hx, etc.) ASSESSMENTS - Wound and Skin A ssessment / Reassessment X - Simple Wound Assessment / Reassessment - one wound 1 5 []  - 0 Complex Wound Assessment / Reassessment - multiple wounds []  - 0 Dermatologic / Skin Assessment (not related to wound area) ASSESSMENTS - Ostomy and/or Continence Assessment and Hawkins []  - 0 Incontinence Assessment and Management []  - 0 Ostomy Hawkins Assessment and Management (repouching, etc.) PROCESS - Coordination of Hawkins X - Simple Patient / Family Education for ongoing Hawkins 1 15 []  - 0 Complex (extensive) Patient / Family Education for ongoing Hawkins X- 1 10 Staff obtains Chiropractor, Records, T Results / Process Orders est []  - 0 Staff telephones HHA, Nursing Homes / Clarify orders / etc []  - 0 Routine Transfer to another Facility (non-emergent condition) []  - 0 Routine Hospital Admission (non-emergent condition) X- 1 15 New Admissions / Manufacturing engineer / Ordering NPWT Apligraf, etc. , []  - 0 Emergency Hospital Admission (emergent condition) X- 1 10 Simple Discharge Coordination []  - 0 Complex (extensive) Discharge Coordination PROCESS - Special Needs []  - 0 Pediatric / Minor Patient Management []  - 0 Isolation Patient  Management []  - 0 Hearing / Language / Visual special needs []  - 0 Assessment of Community assistance (transportation, D/C planning, etc.) []  - 0 Additional assistance / Altered mentation []  - 0 Support Surface(s) Assessment (bed, cushion, seat, etc.) INTERVENTIONS - Wound Cleansing / Measurement X- 1 5 Wound Imaging (photographs - any number of wounds) []  - 0 Wound Tracing (instead of photographs)  X- 1 5 Simple Wound Measurement - one wound []  - 0 Complex Wound Measurement - multiple wounds [name], [name] ([id number]) [id number] Page 3 of 8 X- 1 5 Simple Wound Cleansing - one wound []  - 0 Complex Wound Cleansing - multiple wounds INTERVENTIONS - Wound Dressings X - Small Wound Dressing one or multiple wounds 1 10 []  - 0 Medium Wound Dressing one or multiple wounds []  - 0 Large Wound Dressing one or multiple wounds []  - 0 Application of Medications - injection INTERVENTIONS - Miscellaneous []  - 0 External ear exam []  - 0 Specimen Collection (cultures, biopsies, blood, body fluids, etc.) []  - 0 Specimen(s) / Culture(s) sent or taken to Lab for analysis []  - 0 Patient Transfer (multiple staff / Nurse, adult / Similar devices) []  - 0 Simple Staple / Suture removal (25 or less) []  - 0 Complex Staple / Suture removal (26 or more) []  - 0 Hypo / Hyperglycemic Management (close monitor of Blood Glucose) []  - 0 Ankle / Brachial Index (ABI) - do not check if billed separately Has the patient been seen at the hospital within the last three years: Yes Total Score: 125 Level Of Hawkins: New/Established - Level 4 Electronic Signature(s) Signed: 09/28/2022 4:59:36 PM By: Zenaida Deed RN, BSN Entered By: Zenaida Deed on 09/28/2022 10:12:55 -------------------------------------------------------------------------------- Encounter Discharge Information Details Patient Name: Date of Service: Summer Post S. 09/28/2022 9:00 A M Medical Record Number:  [id number] Patient Account Number: [id number] Date of Birth/Sex: Treating RN: 1935-01-25 (88 y.o. Summer Hawkins Summer Hawkins: Guerry Bruin Other Clinician: Referring Copelyn Widmer: Treating Lydia Meng/Extender: Lolita Rieger in Treatment: 0 Encounter Discharge Information Items Discharge Condition: Stable Ambulatory Status: Walker Discharge Destination: Home Transportation: Private Auto Accompanied By: self Schedule Follow-up Appointment: Yes Clinical Summary of Hawkins: Patient Declined Electronic Signature(s) Signed: 09/28/2022 4:59:36 PM By: Zenaida Deed RN, BSN Entered By: Zenaida Deed on 09/28/2022 10:13:41 [name] ([id number]) [id number] Page 4 of 8 -------------------------------------------------------------------------------- Lower Extremity Assessment Details Patient Name: Date of Service: TESSIA, KASSIN 09/28/2022 9:00 A M Medical Record Number: [id number] Patient Account Number: [id number] Date of Birth/Sex: Treating RN: 27-Jul-1934 (88 y.o. Summer Hawkins Janye Maynor: Guerry Bruin Other Clinician: Referring Sharleen Szczesny: Treating Monesha Monreal/Extender: Lolita Rieger in Treatment: 0 Electronic Signature(s) Signed: 09/28/2022 4:59:36 PM By: Zenaida Deed RN, BSN Entered By: Zenaida Deed on 09/28/2022 09:36:51 -------------------------------------------------------------------------------- Multi Wound Chart Details Patient Name: Date of Service: Summer Post S. 09/28/2022 9:00 A M Medical Record Number: [id number] Patient Account Number: [id number] Date of Birth/Sex: Treating RN: Jul 19, 1934 (88 y.o. F) Primary Hawkins Tayva Easterday: Guerry Bruin Other Clinician: Referring Earon Rivest: Treating Elyse Prevo/Extender: Lolita Rieger in Treatment: 0 Vital Signs Height(in): 64 Capillary Blood Glucose(mg/dl): 433 Weight(lbs):  295 Pulse(bpm): 99 Body Mass Index(BMI): 34.2 Blood Pressure(mmHg): 156/86 Temperature(F): 97.8 Respiratory Rate(breaths/min): 18 [1:Photos:] [N/A:N/A] Back N/A N/A Wound Location: Not Known N/A N/A Wounding Event: T be determined o N/A N/A Primary Etiology: Cataracts, Anemia, Arrhythmia, N/A N/A Comorbid History: Congestive Heart Failure, Hypertension, Type II Diabetes 08/12/2021 N/A N/A Date Acquired: 0 N/A N/A Weeks of Treatment: Open N/A N/A Wound Status: No N/A N/A Wound Recurrence: 0.2x0.2x2.8 N/A N/A Measurements L x W x D (cm) 0.031 N/A N/A A (cm) : rea 0.088 N/A N/A Volume (cm) : Full Thickness With Exposed Support N/A N/A Classification: Structures Small N/A N/A Exudate Amount: Serosanguineous N/A N/A Exudate Type: red, brown N/A N/A Exudate Color: Distinct, outline attached N/A N/A  Wound Margin: Large (67-100%) N/A N/A Granulation Amount: Red N/A N/A Granulation Quality: None Present (0%) N/A N/A Necrotic Amount: Fat Layer (Subcutaneous Tissue): Yes N/A N/A Exposed Structures: Bone: Yes Fascia: No Tendon: No Muscle: No Joint: No None N/A N/A EpithelializationNALLA, PURDY ([id number]) [id number] Page 5 of 8 No Abnormalities Noted N/A N/A Periwound Skin Texture: No Abnormalities Noted N/A N/A Periwound Skin Moisture: No Abnormalities Noted N/A N/A Periwound Skin Color: No Abnormality N/A N/A Temperature: Yes N/A N/A Tenderness on Palpation: probes to bone N/A N/A Assessment Notes: Treatment Notes Wound #1 (Back) Wound Laterality: Midline Cleanser Peri-Wound Hawkins Topical Primary Dressing Zetuvit Plus Silicone Border Dressing 3x3 (in/in) Secondary Dressing Secured With Compression Wrap Compression Stockings Add-Ons Electronic Signature(s) Signed: 09/28/2022 10:27:54 AM By: Duanne Guess MD FACS Entered By: Duanne Guess on 09/28/2022  10:27:54 -------------------------------------------------------------------------------- Pain Assessment Details Patient Name: Date of Service: Summer Post S. 09/28/2022 9:00 A M Medical Record Number: [id number] Patient Account Number: [id number] Date of Birth/Sex: Treating RN: 09/22/1934 (88 y.o. Summer Hawkins Orlander Norwood: Guerry Bruin Other Clinician: Referring Marquise Wicke: Treating Almeda Ezra/Extender: Lolita Rieger in Treatment: 0 Active Problems Location of Pain Severity and Description of Pain Patient Has Paino No Site Locations Rate the pain. Current Pain Level: 0 Pain Management and Medication Current Pain Management: TRINIDAD, INGLE ([id number]) [id number] Page 6 of 8 Electronic Signature(s) Signed: 09/28/2022 4:59:36 PM By: Zenaida Deed RN, BSN Entered By: Zenaida Deed on 09/28/2022 09:37:59 -------------------------------------------------------------------------------- Patient/Caregiver Education Details Patient Name: Date of Service: [name] 7/18/2024andnbsp9:00 A M Medical Record Number: [id number] Patient Account Number: [id number] Date of Birth/Gender: Treating RN: 01-Dec-1934 (88 y.o. Summer Hawkins Physician: Guerry Bruin Other Clinician: Referring Physician: Treating Physician/Extender: Lolita Rieger in Treatment: 0 Education Assessment Education Provided To: Patient Education Topics Provided Elevated Blood Sugar/ Impact on Healing: Methods: Explain/Verbal Responses: Reinforcements needed, State content correctly Infection: Methods: Explain/Verbal Responses: Reinforcements needed, State content correctly Wound/Skin Impairment: Methods: Explain/Verbal Responses: Reinforcements needed, State content correctly Electronic Signature(s) Signed: 09/28/2022 4:59:36 PM By: Zenaida Deed RN, BSN Entered By: Zenaida Deed on  09/28/2022 09:41:28 -------------------------------------------------------------------------------- Wound Assessment Details Patient Name: Date of Service: Summer Post S. 09/28/2022 9:00 A M Medical Record Number: [id number] Patient Account Number: [id number] Date of Birth/Sex: Treating RN: 02-21-1935 (88 y.o. Summer Hawkins Yan Pankratz: Guerry Bruin Other Clinician: Referring Khianna Blazina: Treating Yatzary Merriweather/Extender: Lolita Rieger in Treatment: 0 Wound Status Wound Number: 1 Primary Open Surgical Wound Etiology: Wound Location: Midline Back Wound Not Healed Wounding Event: Surgical Injury Status: Date Acquired: 08/12/2021 Comorbid Cataracts, Anemia, Arrhythmia, Congestive Heart Failure, Weeks Of Treatment: 0 History: Hypertension, Type II Diabetes Clustered Wound: No Photos VELVIA, MEHRER ([id number]) [id number] Page 7 of 8 Wound Measurements Length: (cm) 0.2 Width: (cm) 0.2 Depth: (cm) 2.8 Area: (cm) 0.031 Volume: (cm) 0.088 % Reduction in Area: % Reduction in Volume: Epithelialization: None Tunneling: No Undermining: No Wound Description Classification: Full Thickness With Exposed Support Structures Wound Margin: Distinct, outline attached Exudate Amount: Medium Exudate Type: Serosanguineous Exudate Color: red, brown Foul Odor After Cleansing: No Slough/Fibrino No Wound Bed Granulation Amount: Large (67-100%) Exposed Structure Granulation Quality: Red Fascia Exposed: No Necrotic Amount: None Present (0%) Fat Layer (Subcutaneous Tissue) Exposed: Yes Tendon Exposed: No Muscle Exposed: No Joint Exposed: No Bone Exposed: Yes Periwound Skin Texture Texture Color No Abnormalities Noted: Yes No Abnormalities Noted: Yes Moisture Temperature / Pain No Abnormalities Noted: Yes Temperature:  No Abnormality Tenderness on Palpation: Yes Assessment Notes probes to bone Treatment Notes Wound #1  (Back) Wound Laterality: Midline Cleanser Peri-Wound Hawkins Topical Primary Dressing Zetuvit Plus Silicone Border Dressing 3x3 (in/in) Secondary Dressing Secured With Compression Wrap Compression Stockings Add-Ons Electronic Signature(s) Signed: 09/28/2022 4:59:36 PM By: Zenaida Deed RN, BSN Entered By: Zenaida Deed on 09/28/2022 16:49:30 [name] ([id number]) [phone] [id number].pdf Page 8 of 8 -------------------------------------------------------------------------------- Vitals Details Patient Name: Date of Service: HA WYNNIE, PACETTI 09/28/2022 9:00 A M Medical Record Number: [id number] Patient Account Number: [id number] Date of Birth/Sex: Treating RN: September 04, 1934 (88 y.o. F) Primary Hawkins Shemeka Wardle: Guerry Bruin Other Clinician: Referring Burleigh Brockmann: Treating Billiejo Sorto/Extender: Lolita Rieger in Treatment: 0 Vital Signs Time Taken: 08:53 Temperature (F): 97.8 Height (in): 64 Pulse (bpm): 99 Weight (lbs): 199 Respiratory Rate (breaths/min): 18 Body Mass Index (BMI): 34.2 Blood Pressure (mmHg): 156/86 Capillary Blood Glucose (mg/dl): 643 Reference Range: 80 - 120 mg / dl Notes glucose per pt repot this am Electronic Signature(s) Signed: 09/28/2022 4:59:36 PM By: Zenaida Deed RN, BSN Entered By: Zenaida Deed on 09/28/2022 09:09:18

## 2022-09-30 NOTE — Progress Notes (Signed)
Summer Hawkins ([id number]) [phone] [id number] Page 1 of 4 Visit Report for 09/28/2022 Abuse Risk Screen Details Patient Name: Date of Service: Summer Hawkins, Summer Hawkins 09/28/2022 9:00 A M Medical Record Number: [id number] Patient Account Number: [id number] Date of Birth/Sex: Treating RN: Jul 01, 1934 (88 y.o. Summer Hawkins Trenia Tennyson: Guerry Bruin Other Clinician: Referring Shron Ozer: Treating Korina Tretter/Extender: Lolita Rieger in Treatment: 0 Abuse Risk Screen Items Answer ABUSE RISK SCREEN: Has anyone close to you tried to hurt or harm you recentlyo No Do you feel uncomfortable with anyone in your familyo No Has anyone forced you do things that you didnt want to doo No Electronic Signature(s) Signed: 09/28/2022 4:59:36 PM By: Zenaida Deed RN, BSN Entered By: Zenaida Deed on 09/28/2022 09:33:39 -------------------------------------------------------------------------------- Activities of Daily Living Details Patient Name: Date of Service: Summer Hawkins 09/28/2022 9:00 A M Medical Record Number: [id number] Patient Account Number: [id number] Date of Birth/Sex: Treating RN: October 09, 1934 (88 y.o. Summer Hawkins Analyce Tavares: Guerry Bruin Other Clinician: Referring Dailen Mcclish: Treating Tionna Gigante/Extender: Lolita Rieger in Treatment: 0 Activities of Daily Living Items Answer Activities of Daily Living (Please select one for each item) Drive Automobile Completely Able T Medications ake Completely Able Use T elephone Completely Able Hawkins for Appearance Completely Able Use T oilet Completely Able Bath / Shower Completely Able Dress Self Completely Able Feed Self Completely Able Walk Need Assistance Get In / Out Bed Completely Able Housework Completely Able Prepare Meals Completely Able Handle Money Completely Able Shop for Self Need Assistance Electronic  Signature(s) Signed: 09/28/2022 4:59:36 PM By: Zenaida Deed RN, BSN Entered By: Zenaida Deed on 09/28/2022 09:34:09 [name] ([id number]) [id number] Page 2 of 4 -------------------------------------------------------------------------------- Education Screening Details Patient Name: Date of Service: Summer Hawkins 09/28/2022 9:00 A M Medical Record Number: [id number] Patient Account Number: [id number] Date of Birth/Sex: Treating RN: 1934/04/21 (88 y.o. Summer Hawkins Kherington Meraz: Guerry Bruin Other Clinician: Referring Irene Collings: Treating Kiano Terrien/Extender: Lolita Rieger in Treatment: 0 Primary Learner Assessed: Patient Learning Preferences/Education Level/Primary Language Learning Preference: Explanation, Demonstration, Printed Material Highest Education Level: College or Above Preferred Language: English Cognitive Barrier Language Barrier: No Translator Needed: No Memory Deficit: No Emotional Barrier: No Cultural/Religious Beliefs Affecting Medical Hawkins: No Physical Barrier Impaired Vision: Yes Glasses Impaired Hearing: No Decreased Hand dexterity: No Knowledge/Comprehension Knowledge Level: High Comprehension Level: High Ability to understand written instructions: High Ability to understand verbal instructions: High Motivation Anxiety Level: Calm Cooperation: Cooperative Education Importance: Acknowledges Need Interest in Health Problems: Asks Questions Perception: Coherent Willingness to Engage in Self-Management High Activities: Readiness to Engage in Self-Management High Activities: Electronic Signature(s) Signed: 09/28/2022 4:59:36 PM By: Zenaida Deed RN, BSN Entered By: Zenaida Deed on 09/28/2022 09:34:47 -------------------------------------------------------------------------------- Fall Risk Assessment Details Patient Name: Date of Service: Summer Post S.  09/28/2022 9:00 A M Medical Record Number: [id number] Patient Account Number: [id number] Date of Birth/Sex: Treating RN: 1934-08-20 (88 y.o. Summer Hawkins Adaisha Campise: Guerry Bruin Other Clinician: Referring Brently Voorhis: Treating Imari Reen/Extender: Lolita Rieger in Treatment: 0 Fall Risk Assessment Items Have you had 2 or more falls in the last 12 monthso 0 No COOPER, STAMP S ([id number]) [phone] [id number] Page 3 of 4 Have you had any fall that resulted in injury in the last 12 monthso 0 No FALLS RISK SCREEN History of falling - immediate or within 3 months 0 No Secondary diagnosis (Do you have 2 or more medical  diagnoseso) 0 No Ambulatory aid None/bed rest/wheelchair/nurse 0 No Crutches/cane/walker 15 Yes Furniture 0 No Intravenous therapy Access/Saline/Heparin Lock 0 No Gait/Transferring Normal/ bed rest/ wheelchair 0 No Weak (short steps with or without shuffle, stooped but able to lift head while walking, may seek 10 Yes support from furniture) Impaired (short steps with shuffle, may have difficulty arising from chair, head down, impaired 0 No balance) Mental Status Oriented to own ability 0 Yes Electronic Signature(s) Signed: 09/28/2022 4:59:36 PM By: Zenaida Deed RN, BSN Entered By: Zenaida Deed on 09/28/2022 09:35:29 -------------------------------------------------------------------------------- Foot Assessment Details Patient Name: Date of Service: Summer Post S. 09/28/2022 9:00 A M Medical Record Number: [id number] Patient Account Number: [id number] Date of Birth/Sex: Treating RN: [birth year] (88 y.o. Summer Hawkins Treyvonne Tata: Guerry Bruin Other Clinician: Referring Alnisa Hasley: Treating Boden Stucky/Extender: Lolita Rieger in Treatment: 0 Foot Assessment Items Site Locations + = Sensation present, - = Sensation absent, C = Callus, U = Ulcer R =  Redness, W = Warmth, M = Maceration, PU = Pre-ulcerative lesion F = Fissure, S = Swelling, D = Dryness Assessment Right: Left: Other Deformity: No No Prior Foot Ulcer: No No Prior Amputation: No No Charcot Joint: No No Ambulatory Status: Ambulatory With Help Assistance Device: SCHYLER, BUTIKOFER ([id number]) [phone] [id number] Page 4 of 4 Gait: Steady Electronic Signature(s) Signed: 09/28/2022 4:59:36 PM By: Zenaida Deed RN, BSN Entered By: Zenaida Deed on 09/28/2022 09:36:34 -------------------------------------------------------------------------------- Nutrition Risk Screening Details Patient Name: Date of Service: HA NIKAYLA, MADARIS 09/28/2022 9:00 A M Medical Record Number: [id number] Patient Account Number: [id number] Date of Birth/Sex: Treating RN: June 30, 1934 (88 y.o. Summer Hawkins Natasha Paulson: Guerry Bruin Other Clinician: Referring Aurelio Mccamy: Treating Avaleigh Decuir/Extender: Lolita Rieger in Treatment: 0 Height (in): 64 Weight (lbs): 199 Body Mass Index (BMI): 34.2 Nutrition Risk Screening Items Score Screening NUTRITION RISK SCREEN: I have an illness or condition that made me change the kind and/or amount of food I eat 0 No I eat fewer than two meals per day 0 No I eat few fruits and vegetables, or milk products 0 No I have three or more drinks of beer, liquor or wine almost every day 0 No I have tooth or mouth problems that make it hard for me to eat 0 No I don't always have enough money to buy the food I need 0 No I eat alone most of the time 1 Yes I take three or more different prescribed or over-the-counter drugs a day 1 Yes Without wanting to, I have lost or gained 10 pounds in the last six months 0 No I am not always physically able to shop, cook and/or feed myself 0 No Nutrition Protocols Good Risk Protocol 0 No interventions needed Moderate Risk Protocol High Risk Proctocol Risk  Level: Good Risk Score: 2 Electronic Signature(s) Signed: 09/28/2022 4:59:36 PM By: Zenaida Deed RN, BSN Entered By: Zenaida Deed on 09/28/2022 09:36:16

## 2022-10-06 ENCOUNTER — Other Ambulatory Visit: Payer: Self-pay | Admitting: Cardiovascular Disease

## 2022-11-02 DIAGNOSIS — E113393 Type 2 diabetes mellitus with moderate nonproliferative diabetic retinopathy without macular edema, bilateral: Secondary | ICD-10-CM | POA: Diagnosis not present

## 2022-11-02 DIAGNOSIS — H353133 Nonexudative age-related macular degeneration, bilateral, advanced atrophic without subfoveal involvement: Secondary | ICD-10-CM | POA: Diagnosis not present

## 2022-11-02 DIAGNOSIS — H43813 Vitreous degeneration, bilateral: Secondary | ICD-10-CM | POA: Diagnosis not present

## 2022-11-02 DIAGNOSIS — H401133 Primary open-angle glaucoma, bilateral, severe stage: Secondary | ICD-10-CM | POA: Diagnosis not present

## 2022-11-20 DIAGNOSIS — Z794 Long term (current) use of insulin: Secondary | ICD-10-CM | POA: Diagnosis not present

## 2022-11-20 DIAGNOSIS — H353190 Nonexudative age-related macular degeneration, unspecified eye, stage unspecified: Secondary | ICD-10-CM | POA: Diagnosis not present

## 2022-11-20 DIAGNOSIS — I130 Hypertensive heart and chronic kidney disease with heart failure and stage 1 through stage 4 chronic kidney disease, or unspecified chronic kidney disease: Secondary | ICD-10-CM | POA: Diagnosis not present

## 2022-11-20 DIAGNOSIS — N1832 Chronic kidney disease, stage 3b: Secondary | ICD-10-CM | POA: Diagnosis not present

## 2022-11-20 DIAGNOSIS — Z7901 Long term (current) use of anticoagulants: Secondary | ICD-10-CM | POA: Diagnosis not present

## 2022-11-20 DIAGNOSIS — I509 Heart failure, unspecified: Secondary | ICD-10-CM | POA: Diagnosis not present

## 2022-11-20 DIAGNOSIS — E1122 Type 2 diabetes mellitus with diabetic chronic kidney disease: Secondary | ICD-10-CM | POA: Diagnosis not present

## 2022-11-20 DIAGNOSIS — C50412 Malignant neoplasm of upper-outer quadrant of left female breast: Secondary | ICD-10-CM | POA: Diagnosis not present

## 2022-11-20 DIAGNOSIS — E039 Hypothyroidism, unspecified: Secondary | ICD-10-CM | POA: Diagnosis not present

## 2022-11-20 DIAGNOSIS — Z23 Encounter for immunization: Secondary | ICD-10-CM | POA: Diagnosis not present

## 2022-11-20 DIAGNOSIS — I4821 Permanent atrial fibrillation: Secondary | ICD-10-CM | POA: Diagnosis not present

## 2022-11-27 ENCOUNTER — Ambulatory Visit (INDEPENDENT_AMBULATORY_CARE_PROVIDER_SITE_OTHER): Payer: Medicare Other

## 2022-11-27 DIAGNOSIS — I5032 Chronic diastolic (congestive) heart failure: Secondary | ICD-10-CM | POA: Diagnosis not present

## 2022-11-27 DIAGNOSIS — I4821 Permanent atrial fibrillation: Secondary | ICD-10-CM

## 2022-11-28 LAB — CUP PACEART REMOTE DEVICE CHECK
Battery Remaining Longevity: 108 mo
Battery Voltage: 2.99 V
Brady Statistic AP VP Percent: 0.15 %
Brady Statistic AP VS Percent: 99.82 %
Brady Statistic AS VP Percent: 0 %
Brady Statistic AS VS Percent: 0.03 %
Brady Statistic RA Percent Paced: 99.98 %
Brady Statistic RV Percent Paced: 0.15 %
Date Time Interrogation Session: 20240915195910
Implantable Lead Connection Status: 753985
Implantable Lead Connection Status: 753985
Implantable Lead Implant Date: 20200921
Implantable Lead Implant Date: 20200921
Implantable Lead Location: 753858
Implantable Lead Location: 753860
Implantable Lead Model: 3830
Implantable Lead Model: 5076
Implantable Pulse Generator Implant Date: 20200921
Lead Channel Impedance Value: 323 Ohm
Lead Channel Impedance Value: 380 Ohm
Lead Channel Impedance Value: 380 Ohm
Lead Channel Impedance Value: 494 Ohm
Lead Channel Sensing Intrinsic Amplitude: 11.5 mV
Lead Channel Sensing Intrinsic Amplitude: 11.625 mV
Lead Channel Sensing Intrinsic Amplitude: 11.625 mV
Lead Channel Sensing Intrinsic Amplitude: 17.25 mV
Lead Channel Setting Pacing Amplitude: 2 V
Lead Channel Setting Pacing Amplitude: 2.5 V
Lead Channel Setting Pacing Pulse Width: 0.4 ms
Lead Channel Setting Sensing Sensitivity: 1.2 mV
Zone Setting Status: 755011
Zone Setting Status: 755011

## 2022-12-11 NOTE — Progress Notes (Signed)
Remote pacemaker transmission.   

## 2022-12-14 DIAGNOSIS — H353133 Nonexudative age-related macular degeneration, bilateral, advanced atrophic without subfoveal involvement: Secondary | ICD-10-CM | POA: Diagnosis not present

## 2022-12-14 DIAGNOSIS — E113393 Type 2 diabetes mellitus with moderate nonproliferative diabetic retinopathy without macular edema, bilateral: Secondary | ICD-10-CM | POA: Diagnosis not present

## 2022-12-14 DIAGNOSIS — H43813 Vitreous degeneration, bilateral: Secondary | ICD-10-CM | POA: Diagnosis not present

## 2022-12-14 DIAGNOSIS — H401133 Primary open-angle glaucoma, bilateral, severe stage: Secondary | ICD-10-CM | POA: Diagnosis not present

## 2022-12-14 DIAGNOSIS — H4720 Unspecified optic atrophy: Secondary | ICD-10-CM | POA: Diagnosis not present

## 2022-12-31 ENCOUNTER — Encounter: Payer: Self-pay | Admitting: Infectious Disease

## 2022-12-31 DIAGNOSIS — Z7185 Encounter for immunization safety counseling: Secondary | ICD-10-CM

## 2022-12-31 HISTORY — DX: Encounter for immunization safety counseling: Z71.85

## 2022-12-31 NOTE — Progress Notes (Deleted)
Chief complaint:     Patient ID: Summer Hawkins, female    DOB: Mar 15, 1934, 88 y.o.   MRN: [id number]  HPI  Summer Hawkins is an 88 year old woman with hx of surgery in September of 2022  Bilateral L3-4 and L4-5 laminotomy/foraminotomies/medial facetectomy to decompress the bilateral L3, L4 and L5 nerve roots(the work required to do this was in addition to the work required to do the posterior lumbar interbody fusion because of the patient's spinal stenosis, facet arthropathy. Etc. requiring a wide decompression of the nerve roots.);  L3-4 and L4-5 transforaminal lumbar interbody fusion with Proteo Os, local morselized autograft bone and Zimmer DBM; insertion of interbody prosthesis at L3-4 and L4-5 (globus peek expandable interbody prosthesis); posterior segmental instrumentation from L3 to L5 with globus titanium pedicle screws and rods; posterior lateral arthrodesis at L3-4 and L4-5 with Proteo OS, local morselized autograft bone and Zimmer DBM.   Shortly after surgery she was readmitted to the hospital with Proteus bacteremia.  Neurosurgery at the time did not suspect infection at the surgical site.  She received the antibiotics followed by oral cefadroxil to complete 14 days.  She was then readmitted with drainage from her lumbar wound.  She went back to the operating room on 12/20/2020 and underwent  Incision and drainage of wound with placement of wound VAC .  Operative cultures and singly yielded Proteus which was the same bacteria that was in her blood.  She was discharged on 6 weeks of cefazolin but not seen by [place] and infectious ease since then.  She did have further I&D with neurosurgery and Dr. Lovell Sheehan on overnight 2023.  I do not see that new cultures were obtained.  She has had continued drainage from the site ever since and then various courses of antibiotics including doxycycline and clarithromycin.  Dr. Lovell Sheehan is concerned that there is a fistula between the wound and surgical  site.  I would assume that the hardware is likely infected.  Unfortunately the macrolides and tetracyclines that she has been given would not have activity against Proteus.  Dr. Lovell Sheehan referred her back to [place] for follow-up.  She does not have a follow-up with him in the next few weeks but was told to follow-up in roughly a month.  I wanted at that time to get labs on her and trial her on cefadroxil.  In reviewing her penicillin allergy she tells me that she licked some penicillins from a spoon that she had given her child 50 years ago and that she developed swelling of her lips.  She went to the infirmary and ultimately was sent to the ER where she was told they would need to perform a tracheostomy it sounds as if she was given epinephrine with resolution of what sounds like angioedema.  Has tolerated first-generation cephalosporins recently when she was treated with cefazolin  Is able to get her on cefadroxil and she has been taking this.  Unfortunately while she was on cefadroxil she had intense pruritus all over her body which continued the entire time she was taking antibiotic and actually still going on now though a lesser intensity.   Drainage from her wound did not improve on the antibiotics and still persisted  Back pain is not something that really bothers her very much  We switched her over to cefdinir and she has some slight itching but not nearly to the degree she had with cefadroxil she continues have drainage from the wound in her back and  is made appointment with wound care.  She denies having much back pain whatsoever unless she bends over and stands up too quickly.  He is concerned that her immune system was "destroyed" which apparently physician told her while she was in the hospital.  She had a quite protracted stay in balled her being in the hospital for 5 months she tells me.  Past Medical History:  Diagnosis Date   Atrial fibrillation (HCC)    Breast cancer  (HCC)    Breast cancer, left (HCC)    Chronic kidney disease    stage III - patient was unaware   Diabetes mellitus    Dyspnea    Dysrhythmia    Afib   H/O: hysterectomy    Hardware complicating wound infection (HCC) 08/28/2022   History of colonoscopy 01/25/2010   History of mammogram 08/04/2009   Hyperlipidemia    Hypertension    Hypothyroidism    Ketoacidosis, diabetic, no coma, non-insulin dependent    Type II   Presence of permanent cardiac pacemaker    Proteus infection 08/28/2022   Pruritus 07/10/2022   Vitamin B12 deficiency     Past Surgical History:  Procedure Laterality Date   ABDOMINAL HYSTERECTOMY     BACK SURGERY     Bilateral foot surgery     BREAST EXCISIONAL BIOPSY Left 12/2015   BREAST LUMPECTOMY Left    2017   BREAST LUMPECTOMY WITH RADIOACTIVE SEED AND SENTINEL LYMPH NODE BIOPSY Left 01/19/2016   Procedure: LEFT BREAST LUMPECTOMY WITH RADIOACTIVE SEED AND SENTINEL LYMPH NODE BIOPSY;  Surgeon: Chevis Pretty III, MD;  Location: [facility] OR;  Service: General;  Laterality: Left;   BREAST LUMPECTOMY WITH RADIOACTIVE SEED LOCALIZATION Left 01/19/2016   CARDIOVERSION  02/27/2011   Procedure: CARDIOVERSION;  Surgeon: Marca Ancona, MD;  Location: [facility] OR;  Service: Cardiovascular;  Laterality: N/A;   INSERT / REPLACE / REMOVE PACEMAKER     LAPAROSCOPIC CHOLECYSTECTOMY     LUMBAR WOUND DEBRIDEMENT N/A 12/20/2020   Procedure: LUMBAR WOUND DEBRIDEMENT WITH PLACEMENTOF LUMBAR WOUND VAC;  Surgeon: Tressie Stalker, MD;  Location: [facility] OR;  Service: Neurosurgery;  Laterality: N/A;   PACEMAKER IMPLANT N/A 12/02/2018   Procedure: PACEMAKER IMPLANT;  Surgeon: Marinus Maw, MD;  Location: [facility] INVAS[facility] LAB;  Service: Cardiovascular;  Laterality: N/A;   ROTATOR CUFF REPAIR Left    TUBAL LIGATION     WOUND EXPLORATION N/A 01/19/2022   Procedure: REVISION OF LUMBAR WOUND;  Surgeon: Tressie Stalker, MD;  Location: [facility] OR;  Service: Neurosurgery;  Laterality: N/A;  3C    Family  History  Problem Relation Age of Onset   Heart failure Father 23       enlarged heart   Pneumonia Mother 52   Diabetes Mother 51   Breast cancer Sister    Cancer Neg Hx       Social History   Socioeconomic History   Marital status: Widowed    Spouse name: Not on file   Number of children: 2   Years of education: Not on file   Highest education level: Not on file  Occupational History    Employer: OTHER    Comment: Worked at [employer]  Tobacco Use   Smoking status: Never    Passive exposure: Never   Smokeless tobacco: Never  Vaping Use   Vaping status: Never Used  Substance and Sexual Activity   Alcohol use: No    Alcohol/week: 0.0 standard drinks of alcohol   Drug use: No  Sexual activity: Not on file  Other Topics Concern   Not on file  Social History Narrative   Patient since 65   Husband with prostate cancer   10-siblings-no cancer   Social Determinants of Health   Financial Resource Strain: Not on file  Food Insecurity: No Food Insecurity (01/19/2022)   Hunger Vital Sign    Worried About Running Out of Food in the Last Year: Never true    Ran Out of Food in the Last Year: Never true  Transportation Needs: No Transportation Needs (01/19/2022)   PRAPARE - Administrator, Civil Service (Medical): No    Lack of Transportation (Non-Medical): No  Physical Activity: Not on file  Stress: Not on file  Social Connections: Not on file    Allergies  Allergen Reactions   Bee Venom Shortness Of Breath, Nausea And Vomiting and Other (See Comments)    Makes the patient feel faint, also   Penicillins Anaphylaxis, Hives, Swelling and Other (See Comments)    Has patient had a PCN reaction causing immediate rash, facial/tongue/throat swelling, SOB or lightheadedness with hypotension: Yes Has patient had a PCN reaction causing severe rash involving mucus membranes or skin necrosis: No Has patient had a PCN reaction that required hospitalization: No Has patient  had a PCN reaction occurring within the last 10 years: No If all of the above answers are "NO", then may proceed with Cephalosporin use.    Lisinopril Cough   Zocor [Simvastatin] Other (See Comments)    memory changes   Liraglutide Rash and Other (See Comments)    Rash at injection site     Current Outpatient Medications:    amLODipine (NORVASC) 5 MG tablet, Take 1 tablet (5 mg total) by mouth in the morning., Disp: 90 tablet, Rfl: 3   apixaban (ELIQUIS) 2.5 MG TABS tablet, Take 2.5 mg by mouth 2 (two) times daily., Disp: , Rfl:    cefdinir (OMNICEF) 300 MG capsule, Take 1 capsule (300 mg total) by mouth daily., Disp: 30 capsule, Rfl: 5   Cyanocobalamin (B-12 PO), Take 1 tablet by mouth daily., Disp: , Rfl:    ferrous sulfate 325 (65 FE) MG tablet, Take 325 mg by mouth daily with breakfast., Disp: , Rfl:    insulin aspart (NOVOLOG) 100 UNIT/ML injection, 0-15 Units, Subcutaneous, 3 times daily with meals, CBG < 70: Implement Hypoglycemia measures CBG 70 - 120: 0 units CBG 121 - 150: 2 units CBG 151 - 200: 3 units CBG 201 - 250: 5 units CBG 251 - 300: 8 units CBG 301 - 350: 11 units CBG 351 - 400: 15 units CBG > 400: call MD, Disp: 10 mL, Rfl: 11   irbesartan (AVAPRO) 300 MG tablet, Take 300 mg by mouth daily., Disp: , Rfl:    levothyroxine (SYNTHROID) 137 MCG tablet, Take 137 mcg by mouth daily before breakfast., Disp: , Rfl:    metFORMIN (GLUCOPHAGE) 850 MG tablet, Take 850 mg by mouth 2 (two) times daily with a meal., Disp: , Rfl:    metoprolol succinate (TOPROL-XL) 25 MG 24 hr tablet, TAKE 1 TABLET BY MOUTH ONCE  DAILY, Disp: 90 tablet, Rfl: 3   Multiple Vitamins-Minerals (MULTIVITAMIN WITH MINERALS) tablet, Take 1 tablet by mouth in the morning., Disp: , Rfl:    spironolactone (ALDACTONE) 25 MG tablet, Take 25 mg by mouth in the morning., Disp: , Rfl:    timolol (TIMOPTIC) 0.5 % ophthalmic solution, Place 1 drop into both eyes every morning., Disp: ,  Rfl:    TOUJEO SOLOSTAR 300 UNIT/ML  Solostar Pen, Inject 10-15 Units into the skin See admin instructions. Inject 15 units into the skin in the morning and then inject 10 units at night per patient, Disp: , Rfl:     Review of Systems     Objective:   Physical Exam  Wound 06/07/2022:       Assessment & Plan:

## 2023-01-01 ENCOUNTER — Ambulatory Visit: Payer: Medicare Other | Admitting: Infectious Disease

## 2023-01-01 ENCOUNTER — Telehealth: Payer: Self-pay

## 2023-01-01 DIAGNOSIS — Z7185 Encounter for immunization safety counseling: Secondary | ICD-10-CM

## 2023-01-01 DIAGNOSIS — L299 Pruritus, unspecified: Secondary | ICD-10-CM

## 2023-01-01 DIAGNOSIS — L7682 Other postprocedural complications of skin and subcutaneous tissue: Secondary | ICD-10-CM

## 2023-01-01 DIAGNOSIS — T847XXD Infection and inflammatory reaction due to other internal orthopedic prosthetic devices, implants and grafts, subsequent encounter: Secondary | ICD-10-CM

## 2023-01-01 DIAGNOSIS — R55 Syncope and collapse: Secondary | ICD-10-CM

## 2023-01-01 DIAGNOSIS — Z170 Estrogen receptor positive status [ER+]: Secondary | ICD-10-CM

## 2023-01-01 NOTE — Telephone Encounter (Signed)
Called patient due to missed appointment this afternoon with Dr.Van Dam. Patient stated that she canceled appointment about 6 weeks ago and doesn't want to reschedule at this time. Per Dr.Van Dam patient will need an appointment for any future refill request.    Marcell Anger, CMA

## 2023-01-15 ENCOUNTER — Encounter: Payer: Self-pay | Admitting: Internal Medicine

## 2023-01-15 ENCOUNTER — Ambulatory Visit: Payer: Medicare Other | Attending: Internal Medicine | Admitting: Internal Medicine

## 2023-01-15 VITALS — BP 138/80 | HR 75 | Ht 64.0 in | Wt 204.0 lb

## 2023-01-15 DIAGNOSIS — I4821 Permanent atrial fibrillation: Secondary | ICD-10-CM | POA: Insufficient documentation

## 2023-01-15 LAB — CUP PACEART INCLINIC DEVICE CHECK
Date Time Interrogation Session: 20241104203142
Implantable Lead Connection Status: 753985
Implantable Lead Connection Status: 753985
Implantable Lead Implant Date: 20200921
Implantable Lead Implant Date: 20200921
Implantable Lead Location: 753858
Implantable Lead Location: 753860
Implantable Lead Model: 3830
Implantable Lead Model: 5076
Implantable Pulse Generator Implant Date: 20200921

## 2023-01-15 NOTE — Patient Instructions (Signed)

## 2023-01-15 NOTE — Progress Notes (Signed)
HPI [name] returns today for followup. She is a pleasant 88 yo woman with symptomatic bradycardia due to CHB, s/p PPM insertion. She has a h/o syncope and now chronic atrial fib. She has developed macular degeneration.  She denies chest pain or sob. She has had trouble with healing after back surgery.  Allergies  Allergen Reactions   Bee Venom Shortness Of Breath, Nausea And Vomiting and Other (See Comments)    Makes the patient feel faint, also   Penicillins Anaphylaxis, Hives, Swelling and Other (See Comments)    Has patient had a PCN reaction causing immediate rash, facial/tongue/throat swelling, SOB or lightheadedness with hypotension: Yes Has patient had a PCN reaction causing severe rash involving mucus membranes or skin necrosis: No Has patient had a PCN reaction that required hospitalization: No Has patient had a PCN reaction occurring within the last 10 years: No If all of the above answers are "NO", then may proceed with Cephalosporin use.    Lisinopril Cough   Zocor [Simvastatin] Other (See Comments)    memory changes   Liraglutide Rash and Other (See Comments)    Rash at injection site     Current Outpatient Medications  Medication Sig Dispense Refill   amLODipine (NORVASC) 5 MG tablet Take 1 tablet (5 mg total) by mouth in the morning. 90 tablet 3   apixaban (ELIQUIS) 2.5 MG TABS tablet Take 2.5 mg by mouth 2 (two) times daily.     cefdinir (OMNICEF) 300 MG capsule Take 1 capsule (300 mg total) by mouth daily. 30 capsule 5   Cyanocobalamin (B-12 PO) Take 1 tablet by mouth daily.     ferrous sulfate 325 (65 FE) MG tablet Take 325 mg by mouth daily with breakfast.     insulin aspart (NOVOLOG) 100 UNIT/ML injection 0-15 Units, Subcutaneous, 3 times daily with meals, CBG < 70: Implement Hypoglycemia measures CBG 70 - 120: 0 units CBG 121 - 150: 2 units CBG 151 - 200: 3 units CBG 201 - 250: 5 units CBG 251 - 300: 8 units CBG 301 - 350: 11 units CBG 351 - 400: 15  units CBG > 400: call MD 10 mL 11   irbesartan (AVAPRO) 300 MG tablet Take 300 mg by mouth daily.     levothyroxine (SYNTHROID) 137 MCG tablet Take 137 mcg by mouth daily before breakfast.     metFORMIN (GLUCOPHAGE) 850 MG tablet Take 850 mg by mouth 2 (two) times daily with a meal.     metoprolol succinate (TOPROL-XL) 25 MG 24 hr tablet TAKE 1 TABLET BY MOUTH ONCE  DAILY 90 tablet 3   Multiple Vitamins-Minerals (MULTIVITAMIN WITH MINERALS) tablet Take 1 tablet by mouth in the morning.     spironolactone (ALDACTONE) 25 MG tablet Take 25 mg by mouth in the morning.     timolol (TIMOPTIC) 0.5 % ophthalmic solution Place 1 drop into both eyes every morning.     TOUJEO SOLOSTAR 300 UNIT/ML Solostar Pen Inject 10-15 Units into the skin See admin instructions. Inject 15 units into the skin in the morning and then inject 10 units at night per patient     No current facility-administered medications for this visit.     Past Medical History:  Diagnosis Date   Atrial fibrillation (HCC)    Breast cancer (HCC)    Breast cancer, left (HCC)    Chronic kidney disease    stage III - patient was unaware   Diabetes mellitus  Dyspnea    Dysrhythmia    Afib   H/O: hysterectomy    Hardware complicating wound infection (HCC) 08/28/2022   History of colonoscopy 01/25/2010   History of mammogram 08/04/2009   Hyperlipidemia    Hypertension    Hypothyroidism    Ketoacidosis, diabetic, no coma, non-insulin dependent    Type II   Presence of permanent cardiac pacemaker    Proteus infection 08/28/2022   Pruritus 07/10/2022   Vaccine counseling 12/31/2022   Vitamin B12 deficiency     ROS:   All systems reviewed and negative except as noted in the HPI.   Past Surgical History:  Procedure Laterality Date   ABDOMINAL HYSTERECTOMY     BACK SURGERY     Bilateral foot surgery     BREAST EXCISIONAL BIOPSY Left 12/2015   BREAST LUMPECTOMY Left    2017   BREAST LUMPECTOMY WITH RADIOACTIVE SEED AND  SENTINEL LYMPH NODE BIOPSY Left 01/19/2016   Procedure: LEFT BREAST LUMPECTOMY WITH RADIOACTIVE SEED AND SENTINEL LYMPH NODE BIOPSY;  Surgeon: Chevis Pretty III, MD;  Location: [facility] OR;  Service: General;  Laterality: Left;   BREAST LUMPECTOMY WITH RADIOACTIVE SEED LOCALIZATION Left 01/19/2016   CARDIOVERSION  02/27/2011   Procedure: CARDIOVERSION;  Surgeon: Marca Ancona, MD;  Location: [facility] OR;  Service: Cardiovascular;  Laterality: N/A;   INSERT / REPLACE / REMOVE PACEMAKER     LAPAROSCOPIC CHOLECYSTECTOMY     LUMBAR WOUND DEBRIDEMENT N/A 12/20/2020   Procedure: LUMBAR WOUND DEBRIDEMENT WITH PLACEMENTOF LUMBAR WOUND VAC;  Surgeon: Tressie Stalker, MD;  Location: [facility] OR;  Service: Neurosurgery;  Laterality: N/A;   PACEMAKER IMPLANT N/A 12/02/2018   Procedure: PACEMAKER IMPLANT;  Surgeon: Marinus Maw, MD;  Location: [facility];  Service: Cardiovascular;  Laterality: N/A;   ROTATOR CUFF REPAIR Left    TUBAL LIGATION     WOUND EXPLORATION N/A 01/19/2022   Procedure: REVISION OF LUMBAR WOUND;  Surgeon: Tressie Stalker, MD;  Location: [facility] OR;  Service: Neurosurgery;  Laterality: N/A;  3C     Family History  Problem Relation Age of Onset   Heart failure Father 28       enlarged heart   Pneumonia Mother 51   Diabetes Mother 48   Breast cancer Sister    Cancer Neg Hx      Social History   Socioeconomic History   Marital status: Widowed    Spouse name: Not on file   Number of children: 2   Years of education: Not on file   Highest education level: Not on file  Occupational History    Employer: OTHER    Comment: Worked at [employer]  Tobacco Use   Smoking status: Never    Passive exposure: Never   Smokeless tobacco: Never  Vaping Use   Vaping status: Never Used  Substance and Sexual Activity   Alcohol use: No    Alcohol/week: 0.0 standard drinks of alcohol   Drug use: No   Sexual activity: Not on file  Other Topics Concern   Not on file  Social History Narrative   Patient  since 76   Husband with prostate cancer   10-siblings-no cancer   Social Determinants of Health   Financial Resource Strain: Not on file  Food Insecurity: No Food Insecurity (01/19/2022)   Hunger Vital Sign    Worried About Running Out of Food in the Last Year: Never true    Ran Out of Food in the Last Year: Never true  Transportation  Needs: No Transportation Needs (01/19/2022)   PRAPARE - Administrator, Civil Service (Medical): No    Lack of Transportation (Non-Medical): No  Physical Activity: Not on file  Stress: Not on file  Social Connections: Not on file  Intimate Partner Violence: Not At Risk (01/19/2022)   Humiliation, Afraid, Rape, and Kick questionnaire    Fear of Current or Ex-Partner: No    Emotionally Abused: No    Physically Abused: No    Sexually Abused: No     BP 138/80   Pulse 75   Ht 5\' 4"  (1.626 m)   Wt 204 lb (92.5 kg)   LMP  (LMP Unknown)   SpO2 96%   BMI 35.02 kg/m   Physical Exam:  Well appearing NAD HEENT: Unremarkable Neck:  No JVD, no thyromegally Lymphatics:  No adenopathy Back:  No CVA tenderness Lungs:  Clear with no wheezes HEART:  Regular rate rhythm, no murmurs, no rubs, no clicks Abd:  soft, positive bowel sounds, no organomegally, no rebound, no guarding Ext:  2 plus pulses, no edema, no cyanosis, no clubbing Skin:  No rashes no nodules Neuro:  CN II through XII intact, motor grossly intact  EKG - atrial fib with ventricular pacing  DEVICE  Normal device function.  See PaceArt for details.   Assess/Plan:  1. Atrial fib - her VR is well controlled. No change in meds.  2. CHB - she is pacing her left bundle lead about 99% of the time. She is asymptomatic 3. Diastolic ChF - her symptoms remain class 2. She will continue her current meds. 4. Obesity - she has lost almost 20 lbs .   Leonia Reeves.D.

## 2023-01-25 DIAGNOSIS — H43813 Vitreous degeneration, bilateral: Secondary | ICD-10-CM | POA: Diagnosis not present

## 2023-01-25 DIAGNOSIS — H401133 Primary open-angle glaucoma, bilateral, severe stage: Secondary | ICD-10-CM | POA: Diagnosis not present

## 2023-01-25 DIAGNOSIS — E113393 Type 2 diabetes mellitus with moderate nonproliferative diabetic retinopathy without macular edema, bilateral: Secondary | ICD-10-CM | POA: Diagnosis not present

## 2023-01-25 DIAGNOSIS — H353113 Nonexudative age-related macular degeneration, right eye, advanced atrophic without subfoveal involvement: Secondary | ICD-10-CM | POA: Diagnosis not present

## 2023-01-25 DIAGNOSIS — H353123 Nonexudative age-related macular degeneration, left eye, advanced atrophic without subfoveal involvement: Secondary | ICD-10-CM | POA: Diagnosis not present

## 2023-02-26 ENCOUNTER — Ambulatory Visit (INDEPENDENT_AMBULATORY_CARE_PROVIDER_SITE_OTHER): Payer: Medicare Other

## 2023-02-26 DIAGNOSIS — I4821 Permanent atrial fibrillation: Secondary | ICD-10-CM

## 2023-02-26 LAB — CUP PACEART REMOTE DEVICE CHECK
Battery Remaining Longevity: 105 mo
Battery Voltage: 2.99 V
Brady Statistic AP VP Percent: 0.17 %
Brady Statistic AP VS Percent: 99.8 %
Brady Statistic AS VP Percent: 0 %
Brady Statistic AS VS Percent: 0.03 %
Brady Statistic RA Percent Paced: 99.97 %
Brady Statistic RV Percent Paced: 0.17 %
Date Time Interrogation Session: 20241215184055
Implantable Lead Connection Status: 753985
Implantable Lead Connection Status: 753985
Implantable Lead Implant Date: 20200921
Implantable Lead Implant Date: 20200921
Implantable Lead Location: 753858
Implantable Lead Location: 753860
Implantable Lead Model: 3830
Implantable Lead Model: 5076
Implantable Pulse Generator Implant Date: 20200921
Lead Channel Impedance Value: 342 Ohm
Lead Channel Impedance Value: 380 Ohm
Lead Channel Impedance Value: 399 Ohm
Lead Channel Impedance Value: 494 Ohm
Lead Channel Sensing Intrinsic Amplitude: 11.5 mV
Lead Channel Sensing Intrinsic Amplitude: 11.625 mV
Lead Channel Sensing Intrinsic Amplitude: 11.625 mV
Lead Channel Sensing Intrinsic Amplitude: 17.25 mV
Lead Channel Setting Pacing Amplitude: 2 V
Lead Channel Setting Pacing Amplitude: 2.5 V
Lead Channel Setting Pacing Pulse Width: 0.4 ms
Lead Channel Setting Sensing Sensitivity: 1.2 mV
Zone Setting Status: 755011
Zone Setting Status: 755011

## 2023-03-22 DIAGNOSIS — H43813 Vitreous degeneration, bilateral: Secondary | ICD-10-CM | POA: Diagnosis not present

## 2023-03-22 DIAGNOSIS — H4720 Unspecified optic atrophy: Secondary | ICD-10-CM | POA: Diagnosis not present

## 2023-03-22 DIAGNOSIS — H401133 Primary open-angle glaucoma, bilateral, severe stage: Secondary | ICD-10-CM | POA: Diagnosis not present

## 2023-03-22 DIAGNOSIS — H353123 Nonexudative age-related macular degeneration, left eye, advanced atrophic without subfoveal involvement: Secondary | ICD-10-CM | POA: Diagnosis not present

## 2023-03-22 DIAGNOSIS — H353113 Nonexudative age-related macular degeneration, right eye, advanced atrophic without subfoveal involvement: Secondary | ICD-10-CM | POA: Diagnosis not present

## 2023-03-22 DIAGNOSIS — E113393 Type 2 diabetes mellitus with moderate nonproliferative diabetic retinopathy without macular edema, bilateral: Secondary | ICD-10-CM | POA: Diagnosis not present

## 2023-03-26 ENCOUNTER — Other Ambulatory Visit: Payer: Self-pay | Admitting: Cardiovascular Disease

## 2023-03-26 DIAGNOSIS — Z961 Presence of intraocular lens: Secondary | ICD-10-CM | POA: Diagnosis not present

## 2023-03-26 DIAGNOSIS — H401132 Primary open-angle glaucoma, bilateral, moderate stage: Secondary | ICD-10-CM | POA: Diagnosis not present

## 2023-03-26 DIAGNOSIS — H5340 Unspecified visual field defects: Secondary | ICD-10-CM | POA: Diagnosis not present

## 2023-03-26 DIAGNOSIS — H353232 Exudative age-related macular degeneration, bilateral, with inactive choroidal neovascularization: Secondary | ICD-10-CM | POA: Diagnosis not present

## 2023-03-26 DIAGNOSIS — H52203 Unspecified astigmatism, bilateral: Secondary | ICD-10-CM | POA: Diagnosis not present

## 2023-04-02 NOTE — Progress Notes (Signed)
Remote pacemaker transmission.   

## 2023-05-08 DIAGNOSIS — Z794 Long term (current) use of insulin: Secondary | ICD-10-CM | POA: Diagnosis not present

## 2023-05-08 DIAGNOSIS — I130 Hypertensive heart and chronic kidney disease with heart failure and stage 1 through stage 4 chronic kidney disease, or unspecified chronic kidney disease: Secondary | ICD-10-CM | POA: Diagnosis not present

## 2023-05-08 DIAGNOSIS — E1122 Type 2 diabetes mellitus with diabetic chronic kidney disease: Secondary | ICD-10-CM | POA: Diagnosis not present

## 2023-05-08 DIAGNOSIS — E039 Hypothyroidism, unspecified: Secondary | ICD-10-CM | POA: Diagnosis not present

## 2023-05-08 DIAGNOSIS — E7800 Pure hypercholesterolemia, unspecified: Secondary | ICD-10-CM | POA: Diagnosis not present

## 2023-05-08 DIAGNOSIS — I5032 Chronic diastolic (congestive) heart failure: Secondary | ICD-10-CM | POA: Diagnosis not present

## 2023-05-08 DIAGNOSIS — N1832 Chronic kidney disease, stage 3b: Secondary | ICD-10-CM | POA: Diagnosis not present

## 2023-05-08 DIAGNOSIS — E113591 Type 2 diabetes mellitus with proliferative diabetic retinopathy without macular edema, right eye: Secondary | ICD-10-CM | POA: Diagnosis not present

## 2023-05-08 DIAGNOSIS — E538 Deficiency of other specified B group vitamins: Secondary | ICD-10-CM | POA: Diagnosis not present

## 2023-05-08 DIAGNOSIS — E785 Hyperlipidemia, unspecified: Secondary | ICD-10-CM | POA: Diagnosis not present

## 2023-05-09 DIAGNOSIS — H353123 Nonexudative age-related macular degeneration, left eye, advanced atrophic without subfoveal involvement: Secondary | ICD-10-CM | POA: Diagnosis not present

## 2023-05-09 DIAGNOSIS — E113393 Type 2 diabetes mellitus with moderate nonproliferative diabetic retinopathy without macular edema, bilateral: Secondary | ICD-10-CM | POA: Diagnosis not present

## 2023-05-09 DIAGNOSIS — H4720 Unspecified optic atrophy: Secondary | ICD-10-CM | POA: Diagnosis not present

## 2023-05-09 DIAGNOSIS — H353113 Nonexudative age-related macular degeneration, right eye, advanced atrophic without subfoveal involvement: Secondary | ICD-10-CM | POA: Diagnosis not present

## 2023-05-09 DIAGNOSIS — H401133 Primary open-angle glaucoma, bilateral, severe stage: Secondary | ICD-10-CM | POA: Diagnosis not present

## 2023-05-09 DIAGNOSIS — H43813 Vitreous degeneration, bilateral: Secondary | ICD-10-CM | POA: Diagnosis not present

## 2023-05-15 DIAGNOSIS — E1122 Type 2 diabetes mellitus with diabetic chronic kidney disease: Secondary | ICD-10-CM | POA: Diagnosis not present

## 2023-05-15 DIAGNOSIS — H353190 Nonexudative age-related macular degeneration, unspecified eye, stage unspecified: Secondary | ICD-10-CM | POA: Diagnosis not present

## 2023-05-15 DIAGNOSIS — I509 Heart failure, unspecified: Secondary | ICD-10-CM | POA: Diagnosis not present

## 2023-05-15 DIAGNOSIS — Z1331 Encounter for screening for depression: Secondary | ICD-10-CM | POA: Diagnosis not present

## 2023-05-15 DIAGNOSIS — M545 Low back pain, unspecified: Secondary | ICD-10-CM | POA: Diagnosis not present

## 2023-05-15 DIAGNOSIS — Z0000 Encounter for general adult medical examination without abnormal findings: Secondary | ICD-10-CM | POA: Diagnosis not present

## 2023-05-15 DIAGNOSIS — Z1339 Encounter for screening examination for other mental health and behavioral disorders: Secondary | ICD-10-CM | POA: Diagnosis not present

## 2023-05-15 DIAGNOSIS — C50412 Malignant neoplasm of upper-outer quadrant of left female breast: Secondary | ICD-10-CM | POA: Diagnosis not present

## 2023-05-15 DIAGNOSIS — E113591 Type 2 diabetes mellitus with proliferative diabetic retinopathy without macular edema, right eye: Secondary | ICD-10-CM | POA: Diagnosis not present

## 2023-05-15 DIAGNOSIS — R82998 Other abnormal findings in urine: Secondary | ICD-10-CM | POA: Diagnosis not present

## 2023-05-15 DIAGNOSIS — I4821 Permanent atrial fibrillation: Secondary | ICD-10-CM | POA: Diagnosis not present

## 2023-05-15 DIAGNOSIS — N1832 Chronic kidney disease, stage 3b: Secondary | ICD-10-CM | POA: Diagnosis not present

## 2023-05-15 DIAGNOSIS — E039 Hypothyroidism, unspecified: Secondary | ICD-10-CM | POA: Diagnosis not present

## 2023-05-15 DIAGNOSIS — H3500 Unspecified background retinopathy: Secondary | ICD-10-CM | POA: Diagnosis not present

## 2023-05-15 DIAGNOSIS — I130 Hypertensive heart and chronic kidney disease with heart failure and stage 1 through stage 4 chronic kidney disease, or unspecified chronic kidney disease: Secondary | ICD-10-CM | POA: Diagnosis not present

## 2023-05-28 ENCOUNTER — Ambulatory Visit: Payer: Medicare Other

## 2023-05-28 DIAGNOSIS — I4821 Permanent atrial fibrillation: Secondary | ICD-10-CM

## 2023-05-30 ENCOUNTER — Encounter: Payer: Self-pay | Admitting: Internal Medicine

## 2023-05-30 LAB — CUP PACEART REMOTE DEVICE CHECK
Battery Remaining Longevity: 102 mo
Battery Voltage: 2.99 V
Brady Statistic AP VP Percent: 0.15 %
Brady Statistic AP VS Percent: 99.81 %
Brady Statistic AS VP Percent: 0 %
Brady Statistic AS VS Percent: 0.04 %
Brady Statistic RA Percent Paced: 99.97 %
Brady Statistic RV Percent Paced: 0.15 %
Date Time Interrogation Session: 20250318141736
Implantable Lead Connection Status: 753985
Implantable Lead Connection Status: 753985
Implantable Lead Implant Date: 20200921
Implantable Lead Implant Date: 20200921
Implantable Lead Location: 753858
Implantable Lead Location: 753860
Implantable Lead Model: 3830
Implantable Lead Model: 5076
Implantable Pulse Generator Implant Date: 20200921
Lead Channel Impedance Value: 342 Ohm
Lead Channel Impedance Value: 380 Ohm
Lead Channel Impedance Value: 399 Ohm
Lead Channel Impedance Value: 513 Ohm
Lead Channel Sensing Intrinsic Amplitude: 11.5 mV
Lead Channel Sensing Intrinsic Amplitude: 11.625 mV
Lead Channel Sensing Intrinsic Amplitude: 11.625 mV
Lead Channel Sensing Intrinsic Amplitude: 17.25 mV
Lead Channel Setting Pacing Amplitude: 2 V
Lead Channel Setting Pacing Amplitude: 2.5 V
Lead Channel Setting Pacing Pulse Width: 0.4 ms
Lead Channel Setting Sensing Sensitivity: 1.2 mV
Zone Setting Status: 755011
Zone Setting Status: 755011

## 2023-06-18 DIAGNOSIS — E113393 Type 2 diabetes mellitus with moderate nonproliferative diabetic retinopathy without macular edema, bilateral: Secondary | ICD-10-CM | POA: Diagnosis not present

## 2023-06-18 DIAGNOSIS — H43813 Vitreous degeneration, bilateral: Secondary | ICD-10-CM | POA: Diagnosis not present

## 2023-06-18 DIAGNOSIS — H353113 Nonexudative age-related macular degeneration, right eye, advanced atrophic without subfoveal involvement: Secondary | ICD-10-CM | POA: Diagnosis not present

## 2023-06-18 DIAGNOSIS — H401133 Primary open-angle glaucoma, bilateral, severe stage: Secondary | ICD-10-CM | POA: Diagnosis not present

## 2023-06-18 DIAGNOSIS — H353123 Nonexudative age-related macular degeneration, left eye, advanced atrophic without subfoveal involvement: Secondary | ICD-10-CM | POA: Diagnosis not present

## 2023-07-09 ENCOUNTER — Ambulatory Visit: Payer: Medicare Other | Attending: Cardiovascular Disease | Admitting: Cardiovascular Disease

## 2023-07-09 NOTE — Progress Notes (Deleted)
 No chief complaint on file.  History of Present Illness: 89 yo female with with history of persistent atrial fibrillation, symptomatic bradycardia s/p pacemaker placement, DM, hypothyroidism, HTN and hyperlipidemia today for cardiac follow up. She was found to have atrial fibrillation in 2012. She was cardioverted in 2012. She had symptomatic bradycardia in 2020 and had a permanent pacemaker placed at that time.Echo in May 2019 with LVEF=55-60%, no wall motion abnormalities. No significant valve disease.  She is here today for follow up. The patient denies any chest pain, dyspnea, palpitations, lower extremity edema, orthopnea, PND, dizziness, near syncope or syncope.   Primary Care Physician: Suzzanne Estrin, MD  Past Medical History:  Diagnosis Date   Atrial fibrillation [facility])    Breast cancer [facility])    Breast cancer, left (HCC)    Chronic kidney disease    stage III - patient was unaware   Diabetes mellitus    Dyspnea    Dysrhythmia    Afib   H/O: hysterectomy    Hardware complicating wound infection (HCC) 08/28/2022   History of colonoscopy 01/25/2010   History of mammogram 08/04/2009   Hyperlipidemia    Hypertension    Hypothyroidism    Ketoacidosis, diabetic, no coma, non-insulin  dependent    Type II   Presence of permanent cardiac pacemaker    Proteus infection 08/28/2022   Pruritus 07/10/2022   Vaccine counseling 12/31/2022   Vitamin B12 deficiency     Past Surgical History:  Procedure Laterality Date   ABDOMINAL HYSTERECTOMY     BACK SURGERY     Bilateral foot surgery     BREAST EXCISIONAL BIOPSY Left 12/2015   BREAST LUMPECTOMY Left    2017   BREAST LUMPECTOMY WITH RADIOACTIVE SEED AND SENTINEL LYMPH NODE BIOPSY Left 01/19/2016   Procedure: LEFT BREAST LUMPECTOMY WITH RADIOACTIVE SEED AND SENTINEL LYMPH NODE BIOPSY;  Surgeon: Lillette Reid III, MD;  [facility] OR;  Service: General;  Laterality: Left;   BREAST LUMPECTOMY WITH RADIOACTIVE SEED LOCALIZATION  Left 01/19/2016   CARDIOVERSION  02/27/2011   Procedure: CARDIOVERSION;  Surgeon: Peder Bourdon, MD;  Location: [facility] OR;  Service: Cardiovascular;  Laterality: N/A;   INSERT / REPLACE / REMOVE PACEMAKER     LAPAROSCOPIC CHOLECYSTECTOMY     LUMBAR WOUND DEBRIDEMENT N/A 12/20/2020   Procedure: LUMBAR WOUND DEBRIDEMENT WITH PLACEMENTOF LUMBAR WOUND VAC;  Surgeon: Garry Kansas, MD;  Location: [facility] OR;  Service: Neurosurgery;  Laterality: N/A;   PACEMAKER IMPLANT N/A 12/02/2018   Procedure: PACEMAKER IMPLANT;  Surgeon: Tammie Fall, MD;  [facility]: [facility] INVASIVE CV LAB;  Service: Cardiovascular;  Laterality: N/A;   ROTATOR CUFF REPAIR Left    TUBAL LIGATION     WOUND EXPLORATION N/A 01/19/2022   Procedure: REVISION OF LUMBAR WOUND;  Surgeon: Garry Kansas, MD;  Location: [facility] OR;  Service: Neurosurgery;  Laterality: N/A;  3C    Current Outpatient Medications  Medication Sig Dispense Refill   amLODipine  (NORVASC ) 5 MG tablet TAKE 1 TABLET BY MOUTH DAILY IN  THE MORNING 90 tablet 3   apixaban  (ELIQUIS ) 2.5 MG TABS tablet Take 2.5 mg by mouth 2 (two) times daily.     cefdinir  (OMNICEF ) 300 MG capsule Take 1 capsule (300 mg total) by mouth daily. 30 capsule 5   Cyanocobalamin  (B-12 PO) Take 1 tablet by mouth daily.     ferrous sulfate  325 (65 FE) MG tablet Take 325 mg by mouth daily with breakfast.     insulin  aspart (NOVOLOG ) 100 UNIT/ML  injection 0-15 Units, Subcutaneous, 3 times daily with meals, CBG < 70: Implement Hypoglycemia measures CBG 70 - 120: 0 units CBG 121 - 150: 2 units CBG 151 - 200: 3 units CBG 201 - 250: 5 units CBG 251 - 300: 8 units CBG 301 - 350: 11 units CBG 351 - 400: 15 units CBG > 400: call MD 10 mL 11   irbesartan  (AVAPRO ) 300 MG tablet Take 300 mg by mouth daily.     levothyroxine  (SYNTHROID ) 137 MCG tablet Take 137 mcg by mouth daily before breakfast.     metFORMIN  (GLUCOPHAGE ) 850 MG tablet Take 850 mg by mouth 2 (two) times daily with a meal.     metoprolol  succinate  (TOPROL -XL) 25 MG 24 hr tablet TAKE 1 TABLET BY MOUTH ONCE  DAILY 90 tablet 3   Multiple Vitamins-Minerals (MULTIVITAMIN WITH MINERALS) tablet Take 1 tablet by mouth in the morning.     spironolactone  (ALDACTONE ) 25 MG tablet Take 25 mg by mouth in the morning.     timolol  (TIMOPTIC ) 0.5 % ophthalmic solution Place 1 drop into both eyes every morning.     TOUJEO  SOLOSTAR 300 UNIT/ML Solostar Pen Inject 10-15 Units into the skin See admin instructions. Inject 15 units into the skin in the morning and then inject 10 units at night per patient     No current facility-administered medications for this visit.    Allergies  Allergen Reactions   Bee Venom Shortness Of Breath, Nausea And Vomiting and Other (See Comments)    Makes the patient feel faint, also   Penicillins Anaphylaxis, Hives, Swelling and Other (See Comments)    Has patient had a PCN reaction causing immediate rash, facial/tongue/throat swelling, SOB or lightheadedness with hypotension: Yes Has patient had a PCN reaction causing severe rash involving mucus membranes or skin necrosis: No Has patient had a PCN reaction that required hospitalization: No Has patient had a PCN reaction occurring within the last 10 years: No If all of the above answers are "NO", then may proceed with Cephalosporin use.    Lisinopril Cough   Zocor [Simvastatin] Other (See Comments)    memory changes   Liraglutide Rash and Other (See Comments)    Rash at injection site    Social History   Socioeconomic History   Marital status: Widowed    Spouse name: Not on file   Number of children: 2   Years of education: Not on file   Highest education level: Not on file  Occupational History    Employer: OTHER    Comment: Worked at [employer]  Tobacco Use   Smoking status: Never    Passive exposure: Never   Smokeless tobacco: Never  Vaping Use   Vaping status: Never Used  Substance and Sexual Activity   Alcohol  use: No    Alcohol /week: 0.0 standard  drinks of alcohol    Drug use: No   Sexual activity: Not on file  Other Topics Concern   Not on file  Social History Narrative   Patient since 84   Husband with prostate cancer   10-siblings-no cancer   Social Drivers of Health   Financial Resource Strain: Not on file  Food Insecurity: No Food Insecurity (01/19/2022)   Hunger Vital Sign    Worried About Running Out of Food in the Last Year: Never true    Ran Out of Food in the Last Year: Never true  Transportation Needs: No Transportation Needs (01/19/2022)   PRAPARE - Transportation  Lack of Transportation (Medical): No    Lack of Transportation (Non-Medical): No  Physical Activity: Not on file  Stress: Not on file  Social Connections: Not on file  Intimate Partner Violence: Not At Risk (01/19/2022)   Humiliation, Afraid, Rape, and Kick questionnaire    Fear of Current or Ex-Partner: No    Emotionally Abused: No    Physically Abused: No    Sexually Abused: No    Family History  Problem Relation Age of Onset   Heart failure Father 43       enlarged heart   Pneumonia Mother 50   Diabetes Mother 56   Breast cancer Sister    Cancer Neg Hx     Review of Systems:  As stated in the HPI and otherwise negative.   LMP  (LMP Unknown)   Physical Examination: General: Well developed, well nourished, NAD  HEENT: OP clear, mucus membranes moist  SKIN: warm, dry. No rashes. Neuro: No focal deficits  Musculoskeletal: Muscle strength 5/5 all ext  Psychiatric: Mood and affect normal  Neck: No JVD, no carotid bruits, no thyromegaly, no lymphadenopathy.  Lungs:Clear bilaterally, no wheezes, rhonci, crackles Cardiovascular: Regular rate and rhythm. No murmurs, gallops or rubs. Abdomen:Soft. Bowel sounds present. Non-tender.  Extremities: No lower extremity edema. Pulses are 2 + in the bilateral DP/PT.  Echo May 2019: - Left ventricle: The cavity size was normal. Wall thickness was   increased in a pattern of mild LVH. Systolic  function was normal.   The estimated ejection fraction was in the range of 55% to 60%.   Wall motion was normal; there were no regional wall motion   abnormalities. - Mitral valve: Mildly to moderately calcified annulus. Mildly   thickened leaflets . - Left atrium: The atrium was mildly dilated. - Pulmonary arteries: Systolic pressure was mildly increased. PA   peak pressure: 36 mm Hg (S).  EKG:  EKG is *** ordered today. The ekg ordered today demonstrates   Recent Labs: No results found for requested labs within last 365 days.   Lipid Panel No results found for: "CHOL", "TRIG", "HDL", "CHOLHDL", "VLDL", "LDLCALC", "LDLDIRECT"   Wt Readings from Last 3 Encounters:  01/15/23 92.5 kg  08/28/22 90.7 kg  07/10/22 92.5 kg    Assessment and Plan:   1. Atrial fibrillation, permanent/Symptomatic Bradycardia: She remains in atrial fib. Rate is controlled. Pacemaker in place. Continue Toprol  and Eliquis .   2. HTN: BP is well controlled. No changes today  3. Mitral valve insufficiency: Trivial by echo May 2019. *** Will repeat echo now.   4. Chronic diastolic CHF: Wt is stable. Lasix  as needed.   Labs/ tests ordered today include:  No orders of the defined types were placed in this encounter.  Disposition: F/U with me in one year  Signed, Antoinette Batman, MD 07/09/2023 6:08 AM    [facility] [address] [place], [place], [place]  [address] Phone: [phone]; Fax: [phone]

## 2023-07-11 ENCOUNTER — Encounter: Payer: Self-pay | Admitting: Cardiovascular Disease

## 2023-07-12 NOTE — Progress Notes (Signed)
 Remote pacemaker transmission.

## 2023-07-12 NOTE — Addendum Note (Signed)
 Addended by: Edra Govern D on: 07/12/2023 04:08 PM   Modules accepted: Orders

## 2023-07-25 DIAGNOSIS — E113393 Type 2 diabetes mellitus with moderate nonproliferative diabetic retinopathy without macular edema, bilateral: Secondary | ICD-10-CM | POA: Diagnosis not present

## 2023-07-25 DIAGNOSIS — H353113 Nonexudative age-related macular degeneration, right eye, advanced atrophic without subfoveal involvement: Secondary | ICD-10-CM | POA: Diagnosis not present

## 2023-07-25 DIAGNOSIS — H35372 Puckering of macula, left eye: Secondary | ICD-10-CM | POA: Diagnosis not present

## 2023-07-25 DIAGNOSIS — H43813 Vitreous degeneration, bilateral: Secondary | ICD-10-CM | POA: Diagnosis not present

## 2023-07-25 DIAGNOSIS — H401133 Primary open-angle glaucoma, bilateral, severe stage: Secondary | ICD-10-CM | POA: Diagnosis not present

## 2023-07-25 DIAGNOSIS — H353123 Nonexudative age-related macular degeneration, left eye, advanced atrophic without subfoveal involvement: Secondary | ICD-10-CM | POA: Diagnosis not present

## 2023-08-15 ENCOUNTER — Emergency Department (HOSPITAL_COMMUNITY)

## 2023-08-15 ENCOUNTER — Other Ambulatory Visit: Payer: Self-pay | Admitting: *Deleted

## 2023-08-15 ENCOUNTER — Other Ambulatory Visit: Payer: Self-pay

## 2023-08-15 ENCOUNTER — Observation Stay (HOSPITAL_COMMUNITY)

## 2023-08-15 ENCOUNTER — Encounter (HOSPITAL_COMMUNITY): Payer: Self-pay

## 2023-08-15 ENCOUNTER — Observation Stay (HOSPITAL_COMMUNITY)
Admission: EM | Admit: 2023-08-15 | Discharge: 2023-08-17 | Disposition: A | Discharge status: Skilled Nursing Facility | Attending: Internal Medicine | Admitting: Internal Medicine

## 2023-08-15 ENCOUNTER — Encounter: Payer: Self-pay | Admitting: *Deleted

## 2023-08-15 DIAGNOSIS — Z79899 Other long term (current) drug therapy: Secondary | ICD-10-CM | POA: Diagnosis not present

## 2023-08-15 DIAGNOSIS — Z794 Long term (current) use of insulin: Secondary | ICD-10-CM

## 2023-08-15 DIAGNOSIS — I6521 Occlusion and stenosis of right carotid artery: Secondary | ICD-10-CM

## 2023-08-15 DIAGNOSIS — N183 Chronic kidney disease, stage 3 unspecified: Secondary | ICD-10-CM | POA: Diagnosis present

## 2023-08-15 DIAGNOSIS — C50412 Malignant neoplasm of upper-outer quadrant of left female breast: Secondary | ICD-10-CM | POA: Diagnosis not present

## 2023-08-15 DIAGNOSIS — R4781 Slurred speech: Secondary | ICD-10-CM | POA: Diagnosis not present

## 2023-08-15 DIAGNOSIS — Z6835 Body mass index (BMI) 35.0-35.9, adult: Secondary | ICD-10-CM | POA: Diagnosis not present

## 2023-08-15 DIAGNOSIS — Z7901 Long term (current) use of anticoagulants: Secondary | ICD-10-CM | POA: Diagnosis not present

## 2023-08-15 DIAGNOSIS — I639 Cerebral infarction, unspecified: Principal | ICD-10-CM

## 2023-08-15 DIAGNOSIS — I4821 Permanent atrial fibrillation: Secondary | ICD-10-CM | POA: Diagnosis present

## 2023-08-15 DIAGNOSIS — R001 Bradycardia, unspecified: Secondary | ICD-10-CM | POA: Diagnosis not present

## 2023-08-15 DIAGNOSIS — R4701 Aphasia: Secondary | ICD-10-CM | POA: Diagnosis not present

## 2023-08-15 DIAGNOSIS — N189 Chronic kidney disease, unspecified: Secondary | ICD-10-CM

## 2023-08-15 DIAGNOSIS — N184 Chronic kidney disease, stage 4 (severe): Secondary | ICD-10-CM | POA: Diagnosis present

## 2023-08-15 DIAGNOSIS — I10 Essential (primary) hypertension: Secondary | ICD-10-CM | POA: Diagnosis not present

## 2023-08-15 DIAGNOSIS — R9082 White matter disease, unspecified: Secondary | ICD-10-CM | POA: Diagnosis not present

## 2023-08-15 DIAGNOSIS — Z170 Estrogen receptor positive status [ER+]: Secondary | ICD-10-CM | POA: Diagnosis not present

## 2023-08-15 DIAGNOSIS — E1122 Type 2 diabetes mellitus with diabetic chronic kidney disease: Secondary | ICD-10-CM | POA: Insufficient documentation

## 2023-08-15 DIAGNOSIS — E1110 Type 2 diabetes mellitus with ketoacidosis without coma: Secondary | ICD-10-CM | POA: Diagnosis not present

## 2023-08-15 DIAGNOSIS — I5032 Chronic diastolic (congestive) heart failure: Secondary | ICD-10-CM | POA: Diagnosis not present

## 2023-08-15 DIAGNOSIS — Z950 Presence of cardiac pacemaker: Secondary | ICD-10-CM | POA: Diagnosis not present

## 2023-08-15 DIAGNOSIS — Z955 Presence of coronary angioplasty implant and graft: Secondary | ICD-10-CM | POA: Insufficient documentation

## 2023-08-15 DIAGNOSIS — I6602 Occlusion and stenosis of left middle cerebral artery: Secondary | ICD-10-CM | POA: Diagnosis not present

## 2023-08-15 DIAGNOSIS — R29818 Other symptoms and signs involving the nervous system: Secondary | ICD-10-CM | POA: Diagnosis not present

## 2023-08-15 DIAGNOSIS — E119 Type 2 diabetes mellitus without complications: Secondary | ICD-10-CM | POA: Diagnosis not present

## 2023-08-15 DIAGNOSIS — I6523 Occlusion and stenosis of bilateral carotid arteries: Secondary | ICD-10-CM | POA: Diagnosis not present

## 2023-08-15 DIAGNOSIS — I700 Atherosclerosis of aorta: Secondary | ICD-10-CM | POA: Insufficient documentation

## 2023-08-15 DIAGNOSIS — I4891 Unspecified atrial fibrillation: Secondary | ICD-10-CM | POA: Diagnosis not present

## 2023-08-15 DIAGNOSIS — I130 Hypertensive heart and chronic kidney disease with heart failure and stage 1 through stage 4 chronic kidney disease, or unspecified chronic kidney disease: Secondary | ICD-10-CM | POA: Insufficient documentation

## 2023-08-15 DIAGNOSIS — R29810 Facial weakness: Secondary | ICD-10-CM | POA: Diagnosis not present

## 2023-08-15 DIAGNOSIS — E039 Hypothyroidism, unspecified: Secondary | ICD-10-CM | POA: Diagnosis not present

## 2023-08-15 DIAGNOSIS — Z853 Personal history of malignant neoplasm of breast: Secondary | ICD-10-CM | POA: Insufficient documentation

## 2023-08-15 DIAGNOSIS — Z7984 Long term (current) use of oral hypoglycemic drugs: Secondary | ICD-10-CM | POA: Diagnosis not present

## 2023-08-15 DIAGNOSIS — I129 Hypertensive chronic kidney disease with stage 1 through stage 4 chronic kidney disease, or unspecified chronic kidney disease: Secondary | ICD-10-CM | POA: Diagnosis not present

## 2023-08-15 DIAGNOSIS — I672 Cerebral atherosclerosis: Secondary | ICD-10-CM | POA: Diagnosis not present

## 2023-08-15 DIAGNOSIS — N1832 Chronic kidney disease, stage 3b: Secondary | ICD-10-CM | POA: Diagnosis not present

## 2023-08-15 DIAGNOSIS — E785 Hyperlipidemia, unspecified: Secondary | ICD-10-CM | POA: Diagnosis not present

## 2023-08-15 LAB — COMPREHENSIVE METABOLIC PANEL WITH GFR
ALT: 14 U/L (ref 0–44)
AST: 20 U/L (ref 15–41)
Albumin: 3.4 g/dL — ABNORMAL LOW (ref 3.5–5.0)
Alkaline Phosphatase: 79 U/L (ref 38–126)
Anion gap: 11 (ref 5–15)
BUN: 32 mg/dL — ABNORMAL HIGH (ref 8–23)
CO2: 20 mmol/L — ABNORMAL LOW (ref 22–32)
Calcium: 8.9 mg/dL (ref 8.9–10.3)
Chloride: 107 mmol/L (ref 98–111)
Creatinine, Ser: 1.96 mg/dL — ABNORMAL HIGH (ref 0.44–1.00)
GFR, Estimated: 24 mL/min — ABNORMAL LOW (ref >60)
Glucose, Bld: 222 mg/dL — ABNORMAL HIGH (ref 70–99)
Potassium: 4.5 mmol/L (ref 3.5–5.1)
Sodium: 138 mmol/L (ref 135–145)
Total Bilirubin: 0.9 mg/dL (ref 0.0–1.2)
Total Protein: 7.1 g/dL (ref 6.5–8.1)

## 2023-08-15 LAB — HEMOGLOBIN A1C
Hgb A1c MFr Bld: 7.1 % — ABNORMAL HIGH (ref 4.8–5.6)
Mean Plasma Glucose: 157.07 mg/dL

## 2023-08-15 LAB — I-STAT CHEM 8, ED
BUN: 35 mg/dL — ABNORMAL HIGH (ref 8–23)
Calcium, Ion: 1.11 mmol/L — ABNORMAL LOW (ref 1.15–1.40)
Chloride: 109 mmol/L (ref 98–111)
Creatinine, Ser: 2.1 mg/dL — ABNORMAL HIGH (ref 0.44–1.00)
Glucose, Bld: 223 mg/dL — ABNORMAL HIGH (ref 70–99)
HCT: 42 % (ref 36.0–46.0)
Hemoglobin: 14.3 g/dL (ref 12.0–15.0)
Potassium: 4.4 mmol/L (ref 3.5–5.1)
Sodium: 139 mmol/L (ref 135–145)
TCO2: 19 mmol/L — ABNORMAL LOW (ref 22–32)

## 2023-08-15 LAB — PROTIME-INR
INR: 1 (ref 0.8–1.2)
Prothrombin Time: 13.4 s (ref 11.4–15.2)

## 2023-08-15 LAB — DIFFERENTIAL
Abs Immature Granulocytes: 0.04 10*3/uL (ref 0.00–0.07)
Basophils Absolute: 0.1 10*3/uL (ref 0.0–0.1)
Basophils Relative: 1 %
Eosinophils Absolute: 0.4 10*3/uL (ref 0.0–0.5)
Eosinophils Relative: 4 %
Immature Granulocytes: 0 %
Lymphocytes Relative: 18 %
Lymphs Abs: 1.9 10*3/uL (ref 0.7–4.0)
Monocytes Absolute: 1 10*3/uL (ref 0.1–1.0)
Monocytes Relative: 9 %
Neutro Abs: 6.9 10*3/uL (ref 1.7–7.7)
Neutrophils Relative %: 68 %

## 2023-08-15 LAB — CBC
HCT: 42 % (ref 36.0–46.0)
Hemoglobin: 13.5 g/dL (ref 12.0–15.0)
MCH: 31 pg (ref 26.0–34.0)
MCHC: 32.1 g/dL (ref 30.0–36.0)
MCV: 96.3 fL (ref 80.0–100.0)
Platelets: 280 10*3/uL (ref 150–400)
RBC: 4.36 MIL/uL (ref 3.87–5.11)
RDW: 13.4 % (ref 11.5–15.5)
WBC: 10.3 10*3/uL (ref 4.0–10.5)
nRBC: 0 % (ref 0.0–0.2)

## 2023-08-15 LAB — ETHANOL: Alcohol, Ethyl (B): <15 mg/dL (ref <15)

## 2023-08-15 LAB — APTT: aPTT: 30 s (ref 24–36)

## 2023-08-15 LAB — CBG MONITORING, ED: Glucose-Capillary: 209 mg/dL — ABNORMAL HIGH (ref 70–99)

## 2023-08-15 LAB — MRSA NEXT GEN BY PCR, NASAL: MRSA by PCR Next Gen: NOT DETECTED

## 2023-08-15 MED ORDER — APIXABAN 2.5 MG PO TABS
2.5000 mg | ORAL_TABLET | Freq: Two times a day (BID) | ORAL | Status: DC
  Administered 2023-08-15 – 2023-08-17 (×4): 2.5 mg via ORAL
  Filled 2023-08-15 (×5): qty 1

## 2023-08-15 MED ORDER — SENNOSIDES-DOCUSATE SODIUM 8.6-50 MG PO TABS: 1.0000 | ORAL_TABLET | Freq: Every evening | ORAL | Status: DC | PRN

## 2023-08-15 MED ORDER — IOHEXOL 350 MG/ML SOLN
75.0000 mL | Freq: Once | INTRAVENOUS | Status: AC | PRN
  Administered 2023-08-15: 75 mL via INTRAVENOUS

## 2023-08-15 MED ORDER — CHLORHEXIDINE GLUCONATE CLOTH 2 % EX PADS
6.0000 | MEDICATED_PAD | Freq: Every day | CUTANEOUS | Status: DC
  Administered 2023-08-15 – 2023-08-17 (×3): 6 via TOPICAL

## 2023-08-15 MED ORDER — IOHEXOL 350 MG/ML SOLN
100.0000 mL | Freq: Once | INTRAVENOUS | Status: AC | PRN
  Administered 2023-08-15: 100 mL via INTRAVENOUS

## 2023-08-15 MED ORDER — STROKE: EARLY STAGES OF RECOVERY BOOK
Freq: Once | Status: AC
  Administered 2023-08-16: 1
  Filled 2023-08-15: qty 1

## 2023-08-15 MED ORDER — SODIUM CHLORIDE 0.9 % IV SOLN: INTRAVENOUS | Status: DC

## 2023-08-15 MED ORDER — SODIUM CHLORIDE 0.9 % IV BOLUS
1000.0000 mL | Freq: Once | INTRAVENOUS | Status: AC
  Administered 2023-08-15: 1000 mL via INTRAVENOUS

## 2023-08-15 MED ORDER — LEVOTHYROXINE SODIUM 25 MCG PO TABS
137.0000 ug | ORAL_TABLET | Freq: Every day | ORAL | Status: DC
  Administered 2023-08-16 – 2023-08-17 (×2): 137 ug via ORAL
  Filled 2023-08-15 (×2): qty 1

## 2023-08-15 NOTE — Progress Notes (Signed)
 Patient's pacemaker is not MRI conditional. Cardiology PA cannot clear.

## 2023-08-15 NOTE — Congregational Nurse Program (Signed)
 Patient taken to [place] due to confusion and right sided weakness.  Will follow for updates. Patient lives alone.

## 2023-08-15 NOTE — ED Triage Notes (Addendum)
 Patient BIB [facility] from restaurant, LKW 0945 per friend at breakfast. Patient presented with facial droop and aphasia, progressively improving en route. Patient continues to have difficulty speaking. VSS, 20 R AC, met by stroke team at the bridge.

## 2023-08-15 NOTE — Evaluation (Signed)
 Clinical/Bedside Swallow Evaluation Patient Details  Name: Summer Hawkins MRN: [id number] Date of Birth: 03/11/1935  Today's Date: 08/15/2023 Time: SLP Start Time (ACUTE ONLY): 1535 SLP Stop Time (ACUTE ONLY): 1550 SLP Time Calculation (min) (ACUTE ONLY): 15 min  Past Medical History:  Past Medical History:  Diagnosis Date   Atrial fibrillation (HCC)    Breast cancer (HCC)    Breast cancer, left (HCC)    Chronic kidney disease    stage III - patient was unaware   Diabetes mellitus    Dyspnea    Dysrhythmia    Afib   H/O: hysterectomy    Hardware complicating wound infection (HCC) 08/28/2022   History of colonoscopy 01/25/2010   History of mammogram 08/04/2009   Hyperlipidemia    Hypertension    Hypothyroidism    Ketoacidosis, diabetic, no coma, non-insulin  dependent    Type II   Presence of permanent cardiac pacemaker    Proteus infection 08/28/2022   Pruritus 07/10/2022   Vaccine counseling 12/31/2022   Vitamin B12 deficiency    Past Surgical History:  Past Surgical History:  Procedure Laterality Date   ABDOMINAL HYSTERECTOMY     BACK SURGERY     Bilateral foot surgery     BREAST EXCISIONAL BIOPSY Left 12/2015   BREAST LUMPECTOMY Left    2017   BREAST LUMPECTOMY WITH RADIOACTIVE SEED AND SENTINEL LYMPH NODE BIOPSY Left 01/19/2016   Procedure: LEFT BREAST LUMPECTOMY WITH RADIOACTIVE SEED AND SENTINEL LYMPH NODE BIOPSY;  Surgeon: Lillette Reid III, MD;  Location: [facility] OR;  Service: General;  Laterality: Left;   BREAST LUMPECTOMY WITH RADIOACTIVE SEED LOCALIZATION Left 01/19/2016   CARDIOVERSION  02/27/2011   Procedure: CARDIOVERSION;  Surgeon: Peder Bourdon, MD;  Location[facility] OR;  Service: Cardiovascular;  Laterality: N/A;   INSERT / REPLACE / REMOVE PACEMAKER     LAPAROSCOPIC CHOLECYSTECTOMY     LUMBAR WOUND DEBRIDEMENT N/A 12/20/2020   Procedure: LUMBAR WOUND DEBRIDEMENT WITH PLACEMENTOF LUMBAR WOUND VAC;  Surgeon: Garry Kansas, MD;  Location: [facility] OR;  Service:  Neurosurgery;  Laterality: N/A;   PACEMAKER IMPLANT N/A 12/02/2018   Procedure: PACEMAKER IMPLANT;  Surgeon: Tammie Fall, MD;  [facility] INVASIVE CV LAB;  Service: Cardiovascular;  Laterality: N/A;   ROTATOR CUFF REPAIR Left    TUBAL LIGATION     WOUND EXPLORATION N/A 01/19/2022   Procedure: REVISION OF LUMBAR WOUND;  Surgeon: Garry Kansas, MD;  Location: [facility] OR;  Service: Neurosurgery;  Laterality: N/A;  3C   HPI:  Patient is an 89 y.o. female with PMH: a-fib, breast cancer, CKD, HLD, dyspnea, bradycardia, HTN. She presented to the [facility] ED on 08/15/2023 as code stroke with facial droop, slurred speech. In ED, BP in 130's-150's systolic, CT head negative for intracranial abnormality, CTA head and neck showed LVO at left MCA branch and right ICA occlusion. MRI brain is pending.    Assessment / Plan / Recommendation  Clinical Impression  Patient presents with clinical s/s of dysphagia as per this bedside swallow evaluation. During oral phase, she exhibited mildly prolonged mastication of solids leading to residuals in oral cavity s/p initial swallow. Residuals cleared fully with sips of water. She did exhibit two instances of brief cough following consecutive sips of thin liquids (water) but aspiration risk appears low at this time. SLP will follow for toleration, readiness to advance. If dysphagia symptoms (aka coughing frequency and/or intensity) increase, will likely plan for MBS. SLP Visit Diagnosis: Dysphagia, unspecified (R13.10)    Aspiration Risk  Mild aspiration risk    Diet Recommendation Dysphagia 3 (Mech soft);Thin liquid    Liquid Administration via: Straw;Cup Medication Administration: Whole meds with liquid Supervision: Patient able to self feed Compensations: Slow rate;Small sips/bites Postural Changes: Seated upright at 90 degrees    Other  Recommendations Oral Care Recommendations: Oral care BID    Recommendations for follow up therapy are one component of a  multi-disciplinary discharge planning process, led by the attending physician.  Recommendations may be updated based on patient status, additional functional criteria and insurance authorization.  Follow up Recommendations Other (comment)      Assistance Recommended at Discharge PRN  Functional Status Assessment Patient has had a recent decline in their functional status and demonstrates the ability to make significant improvements in function in a reasonable and predictable amount of time.  Frequency and Duration min 2x/week  1 week       Prognosis Prognosis for improved oropharyngeal function: Good      Swallow Study   General Date of Onset: 08/15/23 HPI: Patient is an 89 y.o. female with PMH: a-fib, breast cancer, CKD, HLD, dyspnea, bradycardia, HTN. She presented to [facility] ED on 08/15/2023 as code stroke with facial droop, slurred speech. In ED, BP in 130's-150's systolic, CT head negative for intracranial abnormality, CTA head and neck showed LVO at left MCA branch and right ICA occlusion. MRI brain is pending. Type of Study: Bedside Swallow Evaluation Previous Swallow Assessment: 2022 bedside swallow evaluation Diet Prior to this Study: NPO Temperature Spikes Noted: No Respiratory Status: Room air History of Recent Intubation: No Behavior/Cognition: Alert;Cooperative;Pleasant mood Oral Cavity Assessment: Within Functional Limits Oral Care Completed by SLP: No Oral Cavity - Dentition: Adequate natural dentition Vision: Functional for self-feeding Self-Feeding Abilities: Able to feed self Patient Positioning: Upright in bed Baseline Vocal Quality: Normal Volitional Cough: Weak Volitional Swallow: Able to elicit    Oral/Motor/Sensory Function Overall Oral Motor/Sensory Function: Mild impairment Lingual ROM: Reduced right;Reduced left Lingual Symmetry: Within Functional Limits Lingual Strength: Reduced   Ice Chips     Thin Liquid Thin Liquid: Impaired Presentation:  Straw Pharyngeal  Phase Impairments: Other (comments) Other Comments: two instances of delayed cough    Nectar Thick     Honey Thick     Puree Puree: Within functional limits Presentation: Spoon;Self Fed   Solid     Solid: Impaired Oral Phase Functional Implications: Impaired mastication;Right anterior spillage;Oral residue      Jacqualine Mater, MA, CCC-SLP Speech Therapy

## 2023-08-15 NOTE — Code Documentation (Signed)
 Code stroke called again due to reoccurring aphasia on pt Summer Hawkins. Since her initial workup this morning, her aphasia has reoccurred twice. Code stroke activated to facilitate STAT CTP imaging and read.    At this time, Aphasia is absent. NIHSS 2 for Left leg weakness which is chronic. Imaging completed. Neurologist, Neuro Interventionalist spoke to pt's Son and Niece Alane Hsu who is her medical POA [phone]). At this time, family and team is in agreement to not proceed with intervention at this time, but to admit patient to NICU with close monitoring.     -Care Plan: NIHSS and VS q 1 hour    -Goal SBP > 150    -NOTIFY NEUROLOGIST ON CALL FOR      WORSENING     Mechele Spiegel RN   Stroke Response

## 2023-08-15 NOTE — Code Documentation (Signed)
 Stroke Response Nurse Documentation Code Documentation  Summer Hawkins is a 89 y.o. female arriving to [facility]  via [facility] on 08/15/2023 with past medical hx significant of hypertension, hyperlipidemia, atrial fibrillation, diabetes, hypothyroidism, CKD stage III, and left breast cancer. On Eliquis  (apixaban ) daily. Last taken last night. Code stroke was activated by EMS.   Patient was out to lunch with a friend where she was LKW at 0945 and her friend noted she   Stroke team at the bedside on patient arrival. Labs drawn and patient cleared for CT by Dr. Nolia Baumgartner. Patient to CT with team. NIHSS 12, see documentation for details and code stroke times. Patient with disoriented, right facial droop, bilateral leg weakness, right limb ataxia, Expressive aphasia , and dysarthria  on exam. The following imaging was completed:  CT Head. Patient is not a candidate for IV Thrombolytic due to on Eliquis . Patient is not a candidate for IR due to improving symptoms. NIHSS 5 now.   Care Plan: Monitor closely for worsening NIHSS. Activate code stroke if worsening for 24 hours as she is potential IR candidate.   Process Delays Noted: determining plan for possible IR  Bedside handoff with ED RN Paden.    Felicitas Horse Stroke Response RN

## 2023-08-15 NOTE — Progress Notes (Signed)
 Neurology progress note and repeat stroke code  I was called to evaluate this patient for fluctuating speech difficulties.  She was a stroke code for severe aphasia this morning and was found to have a left M2 branch occlusion.  Myself and Dr. Alvira Josephs were in the process of consenting her son Geralyn Knee over the phone for intervention when her aphasia completely resolved and she was back to baseline.  At baseline she has minimal word finding difficulties and a mild dysarthria as well as left leg weakness.  Her stroke scale improved to a 3 for dysarthria and left leg weakness therefore intervention was not performed.  Since that time she has had 3 discrete episodes of severe aphasia.  As soon as we evaluate her she completely resolves.  At the most recent change in exam stroke code was activated so that we could reassess the left M2 circulation.  That region of vasculature does appear to be recanalizing and is not occluded as it was on this morning's CTA.  There does still appear to be a clot that is nonocclusive there.  Her CT perfusion showed a penumbra of 10 mL with no core infarct.  She has a known right carotid occlusion which is chronic. Exam during her second stroke code was baseline NIHSS = 3 per above.   Dr. Alvira Josephs and I had a very extensive conversation again at bedside with patient (not currently aphasic), son Geralyn Knee, and granddaughter/HCPOA Alane Hsu.  We explained the situation and the evolution and the changes we were seeing in her vasculature by CTA.  There is concern that given her recurrent aphasia she may occlude that branch again and intervention at that time may be warranted.  However based on the fact that she is currently not aphasic we felt that the risk of intervention at this time outweighed the benefit.  Patient, son, and granddaughter HCPOA were in agreement.  We did decide to move her to the ICU overnight for very close monitoring.  If her symptoms recurred then she should  undergo immediate repeat stroke code with a CTA/CTP and call from the overnight neurohospitalist to Dr. Alvira Josephs.  Her episodes of aphasia have not been position dependent and do not appear to be related to transient decreases in blood pressure however we did give her a 1 L bolus and will leave her on continuous fluids overnight with an increased blood pressure goal of systolic greater than 150 to maximize perfusion to that area.  Hospitalist service will continue to be primary and we will follow closely.  She will be able to be moved out of the ICU in the morning if she has no recurrent events.  D/w Dr. Alvira Josephs (neurointervention), Dr. Murvin Arthurs (overnight neurohospitalist), Dr. Rufina Cough (hospitalist).  This patient is critically ill and at significant risk of neurological worsening, death and care requires constant monitoring of vital signs, hemodynamics,respiratory and cardiac monitoring, neurological assessment, discussion with family, other specialists and medical decision making of high complexity. I spent 60 minutes of neurocritical care time  in the care of  this patient. This was time spent independent of any time provided by nurse practitioner or PA.  Greg Leaks, MD [facility] [phone]  If 7pm- 7am, please page neurology on call as listed in AMION.

## 2023-08-15 NOTE — ED Provider Notes (Signed)
 [place] EMERGENCY DEPARTMENT AT [facility] Provider Note   CSN: [id number] Arrival date & time: 08/15/23  8295  An emergency department physician performed an initial assessment on this suspected stroke patient at 1033.  History  Chief Complaint  Patient presents with   Code Stroke    NEEMA BARREIRA is a 89 y.o. female.  Patient is an 89 year old female with a history of hypertension, hyperlipidemia, diabetes, atrial fibrillation on Eliquis  who presents as a code stroke.  Per EMS, she was with her friend at breakfast and had sudden onset of facial drooping and difficulty speaking.  Per chart review, she does have a history of atrial fibrillation and is on Eliquis .  She also has a history of hypertension, hyperlipidemia, prior breast cancer, diabetes, chronic kidney disease she does have a pacemaker.       Home Medications Prior to Admission medications   Medication Sig Start Date End Date Taking? Authorizing Provider  amLODipine  (NORVASC ) 5 MG tablet TAKE 1 TABLET BY MOUTH DAILY IN  THE MORNING 03/27/23   Odie Benne, MD  apixaban  (ELIQUIS ) 2.5 MG TABS tablet Take 2.5 mg by mouth 2 (two) times daily. 07/08/21   [provider]  Cyanocobalamin  (B-12 PO) Take 1 tablet by mouth daily.    [provider]  ferrous sulfate  325 (65 FE) MG tablet Take 325 mg by mouth daily with breakfast.    [provider]  insulin  aspart (NOVOLOG ) 100 UNIT/ML injection 0-15 Units, Subcutaneous, 3 times daily with meals, CBG < 70: Implement Hypoglycemia measures CBG 70 - 120: 0 units CBG 121 - 150: 2 units CBG 151 - 200: 3 units CBG 201 - 250: 5 units CBG 251 - 300: 8 units CBG 301 - 350: 11 units CBG 351 - 400: 15 units CBG > 400: call MD 12/01/20   Burton Casey, MD  irbesartan  (AVAPRO ) 300 MG tablet Take 300 mg by mouth daily.    [provider]  levothyroxine  (SYNTHROID ) 137 MCG tablet Take 137 mcg by mouth daily before breakfast. 07/02/18    [provider]  metFORMIN  (GLUCOPHAGE ) 850 MG tablet Take 850 mg by mouth 2 (two) times daily with a meal.    [provider]  metoprolol  succinate (TOPROL -XL) 25 MG 24 hr tablet TAKE 1 TABLET BY MOUTH ONCE  DAILY 10/09/22   Odie Benne, MD  Multiple Vitamins-Minerals (MULTIVITAMIN WITH MINERALS) tablet Take 1 tablet by mouth in the morning.    [provider]  spironolactone  (ALDACTONE ) 25 MG tablet Take 25 mg by mouth in the morning.    [provider]  timolol  (TIMOPTIC ) 0.5 % ophthalmic solution Place 1 drop into both eyes every morning. 05/22/20   [provider]  TOUJEO  SOLOSTAR 300 UNIT/ML Solostar Pen Inject 10-15 Units into the skin See admin instructions. Inject 15 units into the skin in the morning and then inject 10 units at night per patient 09/23/21   [provider]      Allergies    Bee venom, Penicillins, Lisinopril, Zocor [simvastatin], and Liraglutide    Review of Systems   Review of Systems  Unable to perform ROS: Acuity of condition    Physical Exam Updated Vital Signs BP (!) 156/78   Pulse 69   Resp 18   Ht 5\' 4"  (1.626 m)   Wt 93.2 kg   LMP  (LMP Unknown)   SpO2 97%   BMI 35.27 kg/m  Physical Exam Constitutional:  Appearance: She is well-developed.  HENT:     Head: Normocephalic and atraumatic.  Eyes:     Pupils: Pupils are equal, round, and reactive to light.  Cardiovascular:     Rate and Rhythm: Normal rate and regular rhythm.     Heart sounds: Normal heart sounds.  Pulmonary:     Effort: Pulmonary effort is normal. No respiratory distress.     Breath sounds: Normal breath sounds. No wheezing or rales.  Chest:     Chest wall: No tenderness.  Abdominal:     General: Bowel sounds are normal.     Palpations: Abdomen is soft.     Tenderness: There is no abdominal tenderness. There is no guarding or rebound.  Musculoskeletal:        General: Normal range of motion.     Cervical  back: Normal range of motion and neck supple.  Lymphadenopathy:     Cervical: No cervical adenopathy.  Skin:    General: Skin is warm and dry.     Findings: No rash.  Neurological:     Mental Status: She is alert and oriented to person, place, and time.     Comments: Right-sided facial drooping, slurred speech, some mild right arm leg     ED Results / Procedures / Treatments   Labs (all labs ordered are listed, but only abnormal results are displayed) Labs Reviewed  COMPREHENSIVE METABOLIC PANEL WITH GFR - Abnormal; Notable for the following components:      Result Value   CO2 20 (*)    Glucose, Bld 222 (*)    BUN 32 (*)    Creatinine, Ser 1.96 (*)    Albumin 3.4 (*)    GFR, Estimated 24 (*)    All other components within normal limits  I-STAT CHEM 8, ED - Abnormal; Notable for the following components:   BUN 35 (*)    Creatinine, Ser 2.10 (*)    Glucose, Bld 223 (*)    Calcium , Ion 1.11 (*)    TCO2 19 (*)    All other components within normal limits  CBG MONITORING, ED - Abnormal; Notable for the following components:   Glucose-Capillary 209 (*)    All other components within normal limits  ETHANOL  PROTIME-INR  APTT  CBC  DIFFERENTIAL  RAPID URINE DRUG SCREEN, HOSP PERFORMED  HEMOGLOBIN A1C    EKG None  Radiology CT ANGIO HEAD NECK W WO CM (CODE STROKE) Result Date: 08/15/2023 CLINICAL DATA:  89 year old female.  Code stroke. EXAM: CT ANGIOGRAPHY HEAD AND NECK WITH AND WITHOUT CONTRAST TECHNIQUE: Multidetector CT imaging of the head and neck was performed using the standard protocol during bolus administration of intravenous contrast. Multiplanar CT image reconstructions and MIPs were obtained to evaluate the vascular anatomy. Carotid stenosis measurements (when applicable) are obtained utilizing NASCET criteria, using the distal internal carotid diameter as the denominator. RADIATION DOSE REDUCTION: This exam was performed according to the departmental  dose-optimization program which includes automated exposure control, adjustment of the mA and/or kV according to patient size and/or use of iterative reconstruction technique. CONTRAST:  75mL OMNIPAQUE IOHEXOL 350 MG/ML SOLN COMPARISON:  Plain head CT 1046 hours today. FINDINGS: CTA NECK Skeleton: No acute osseous abnormality identified. Absent maxillary dentition. Spine degeneration. Upper chest: Negative. Other neck: Nonvascular neck soft tissue spaces are within normal limits. Aortic arch: Calcified aortic atherosclerosis.  3 vessel arch. Right carotid system: Brachiocephalic artery and plaque without stenosis. Dense right subclavian venous contrast streak artifact affecting visualization  of the proximal right CCA. However, before the level of the right carotid bifurcation there is relatively poor CCA enhancement compared to the left side (series 9, image 109). Faint enhancement through the right carotid bifurcation but the right ICA is definitely occluded by the level of the distal bulb (series 9, image 91). And there is no reconstitution to the skull base. Left carotid system: Tortuous proximal left CCA with mild calcified plaque at the origin. Mild for age calcified plaque at the left carotid bifurcation. Tortuosity in the neck below the skull base but no significant stenosis in the neck. Vertebral arteries: Calcified right subclavian artery origin without stenosis. Calcified plaque near the right vertebral artery origin without significant stenosis. Right vertebral is patent to the skull base with no additional plaque or stenosis. Proximal left subclavian soft and calcified plaque without stenosis. Patent left vertebral artery origin. Tortuous left V1 segment. Fairly codominant left vertebral artery is patent to the skull base with no significant plaque or stenosis. CTA HEAD Posterior circulation: Distal ritual arteries, PICA origins, basilar artery, SCA and right PCA origins are patent without significant  plaque or stenosis. There is a fetal type left PCA. A small right posterior communicating artery is present. Bilateral PCA branches are within normal limits. Anterior circulation: Right ICA siphon is occluded until reconstituted right MCA and ACA origins, right ICA terminus just distal to the right posterior communicating artery level. Slightly asymmetrically decreased right MCA and ACA compared to the left side. But relatively symmetric level of enhancement. No right MCA or ACA branch occlusion identified. Left ICA siphon is patent with calcified plaque most pronounced in the supraclinoid segment, mild supraclinoid stenosis suspected on series 11, image 100. Patent left ICA terminus. Normal left posterior communicating artery. Left ACA A1, anterior communicating artery, proximal ACA branches are within normal limits. Left MCA M1 is patent and bifurcates relatively early without stenosis (series 13, image 32. Dominant superior division at that bifurcation then bifurcates a 2nd time without stenosis on series 11, image 124. But distal to that middle MCA division branch is occluded at its origin on series 11, image 132. This occlusion is also visible on series 7, images 109 through 107. And there is decreased middle left MCA division vessel density when compared to the right side. Venous sinuses: Early contrast timing, not evaluated. Anatomic variants: Fetal type left PCA origin. Review of the MIP images confirms the above findings Preliminary report of the above discussed by telephone with Dr. Greg Leaks on 08/15/2023 at 11:03 . IMPRESSION: 1. Positive for suspected ELVO: Left MCA middle division branch seen best on series 11, image 132. This is an M3 versus very distal M2 occlusion in light of somewhat early Left MCA bifurcation upstream of this. 2. Positive also for completely occluded Right ICA at the distal bulb, poor upstream Right CCA enhancement, no reconstitution and told the right ICA terminus. But upon  discussion with Dr. Greg Leaks , this is most likely chronic. And there is relatively good collateral enhancement of the right ACA and MCA. 3. Other atherosclerosis in the head and neck without significant stenosis, including Left ICA supraclinoid calcified plaque with mild stenosis. 4.  Aortic Atherosclerosis (ICD10-I70.0). Preliminary report of the above discussed by telephone with Dr. Greg Leaks on 08/15/2023 at 11:03 . Electronically Signed   By: Marlise Simpers M.D.   On: 08/15/2023 11:11   CT HEAD CODE STROKE WO CONTRAST Result Date: 08/15/2023 CLINICAL DATA:  Code stroke. 89 year old female with isolated aphasia.  EXAM: CT HEAD WITHOUT CONTRAST TECHNIQUE: Contiguous axial images were obtained from the base of the skull through the vertex without intravenous contrast. RADIATION DOSE REDUCTION: This exam was performed according to the departmental dose-optimization program which includes automated exposure control, adjustment of the mA and/or kV according to patient size and/or use of iterative reconstruction technique. COMPARISON:  Head CT 11/25/2020. FINDINGS: Brain: Cerebral volume not significantly changed from 2022. No midline shift, ventriculomegaly, mass effect, evidence of mass lesion, intracranial hemorrhage or evidence of cortically based acute infarction. Patchy chronic cerebral white matter hypodensity appears stable since 2022. Stable gray-white matter differentiation throughout the brain. Faint chronic basal ganglia vascular calcifications. Vascular: Calcified atherosclerosis at the skull base. No suspicious intracranial vascular hyperdensity. Skull: Stable and intact.  No acute osseous abnormality identified. Sinuses/Orbits: Visualized paranasal sinuses and mastoids are clear. Other: No acute orbit or scalp soft tissue finding. ASPECTS [facility] Stroke Program Early CT Score) Total score (0-10 with 10 being normal): 10 IMPRESSION: 1. No acute cortically based infarct or acute intracranial hemorrhage  identified. ASPECTS 10. 2. Stable non contrast CT appearance of the brain since 2022. Mild for age white matter disease. 3. These results were communicated to Dr. Doretta Gant at 10:50 am on 08/15/2023 by text page via the [facility] messaging system. Electronically Signed   By: Marlise Simpers M.D.   On: 08/15/2023 10:51    Procedures Procedures   Medications Ordered in ED Medications   stroke: early stages of recovery book (has no administration in time range)  levothyroxine  (SYNTHROID ) tablet 137 mcg (has no administration in time range)  apixaban  (ELIQUIS ) tablet 2.5 mg (has no administration in time range)  senna-docusate (Senokot-S) tablet 1 tablet (has no administration in time range)  iohexol (OMNIPAQUE) 350 MG/ML injection 75 mL (75 mLs Intravenous Contrast Given 08/15/23 1055)    ED Course/ Medical Decision Making/ A&P                                 Medical Decision Making Amount and/or Complexity of Data Reviewed Labs: ordered. Radiology: ordered.  Risk Decision regarding hospitalization.   This patient presents to the ED for concern of aphasia, this involves an extensive number of treatment options, and is a complaint that carries with it a high risk of complications and morbidity.  I considered the following differential and admission for this acute, potentially life threatening condition.  The differential diagnosis includes stroke, intracranial hemorrhage, infection, metabolic encephalopathy  MDM:    Patient presents as a code stroke.  She had difficulty with her speech with some right side facial drooping.  CT scan does not show any acute abnormality.  CTA shows an LVO.  Patient was actually getting consented to go to IR by Dr. Doretta Gant with neurology who has consulted on the patient.  At this point the patient's symptoms resolved and patient seems to be back at baseline.  Will plan admission to the hospitalist service.  Discussed with Dr. Rufina Cough who will admit the patient.  (Labs, imaging,  consults)  Labs: I Ordered, and personally interpreted labs.  The pertinent results include: Labs overall nonconcerning.  Creatinine is elevated but similar to prior values.  Glucose is elevated but no signs of DKA.  Imaging Studies ordered: I ordered imaging studies including CT head, CTA of the head and neck I independently visualized and interpreted imaging. I agree with the radiologist interpretation  Additional history obtained from family, chart review.  External records from outside source obtained and reviewed including prior notes  Cardiac Monitoring: The patient was maintained on a cardiac monitor.  If on the cardiac monitor, I personally viewed and interpreted the cardiac monitored which showed an underlying rhythm of: Paced rhythm  Reevaluation: After the interventions noted above, I reevaluated the patient and found that they have :improved  Social Determinants of Health:  none  Disposition: Admit to the hospital  Co morbidities that complicate the patient evaluation  CRITICAL CARE Performed by: Hershel Los Total critical care time: 70 minutes Critical care time was exclusive of separately billable procedures and treating other patients. Critical care was necessary to treat or prevent imminent or life-threatening deterioration. Critical care was time spent personally by me on the following activities: development of treatment plan with patient and/or surrogate as well as nursing, discussions with consultants, evaluation of patient's response to treatment, examination of patient, obtaining history from patient or surrogate, ordering and performing treatments and interventions, ordering and review of laboratory studies, ordering and review of radiographic studies, pulse oximetry and re-evaluation of patient's condition.   Past Medical History:  Diagnosis Date   Atrial fibrillation (HCC)    Breast cancer (HCC)    Breast cancer, left (HCC)    Chronic kidney disease     stage III - patient was unaware   Diabetes mellitus    Dyspnea    Dysrhythmia    Afib   H/O: hysterectomy    Hardware complicating wound infection (HCC) 08/28/2022   History of colonoscopy 01/25/2010   History of mammogram 08/04/2009   Hyperlipidemia    Hypertension    Hypothyroidism    Ketoacidosis, diabetic, no coma, non-insulin  dependent    Type II   Presence of permanent cardiac pacemaker    Proteus infection 08/28/2022   Pruritus 07/10/2022   Vaccine counseling 12/31/2022   Vitamin B12 deficiency      Medicines Meds ordered this encounter  Medications   iohexol (OMNIPAQUE) 350 MG/ML injection 75 mL    stroke: early stages of recovery book   levothyroxine  (SYNTHROID ) tablet 137 mcg   apixaban  (ELIQUIS ) tablet 2.5 mg   senna-docusate (Senokot-S) tablet 1 tablet    I have reviewed the patients home medicines and have made adjustments as needed  Problem List / ED Course: Problem List Items Addressed This Visit   None Visit Diagnoses       Acute ischemic stroke (HCC)    -  Primary   Relevant Medications   apixaban  (ELIQUIS ) tablet 2.5 mg (Start on 08/15/2023 10:00 PM)             Final Clinical Impression(s) / ED Diagnoses Final diagnoses:  Acute ischemic stroke [facility])    Rx / DC Orders ED Discharge Orders     None         Hershel Los, MD 08/15/23 1314

## 2023-08-15 NOTE — H&P (Addendum)
 History and Physical   BRITTINI BRUBECK HQI:[id number] DOB: [birth year] DOA: 08/15/2023  PCP: Suzzanne Estrin, MD   Patient coming from: Home  Chief Complaint: Code stroke, facial droop, slurred speech  HPI: Summer Hawkins is a 89 y.o. female with medical history significant of hypertension, diabetes, hypothyroidism, atrial fibrillation, bradycardia, CKD 3, diastolic CHF, breast cancer, obesity presenting with sudden onset facial droop and difficulty speaking.  Patient arrived as a code stroke after sudden onset of facial droop and difficulty speaking while at breakfast with a friend.  Patient denies fevers, chills, chest pain, shortness of breath, abdominal pain, constipation, diarrhea, nausea, vomiting.  ED Course: Vital signs in the ED notable for blood pressure in the 130s-150 systolic.  Lab workup included CMP with bicarb 20, BUN 32, creatinine 0.96, glucose 222, albumin 3.4.  CBC within normal limits.  PT and INR normal.  Ethanol level negative.  UDS pending.  Lipid panel and A1c pending.  CT head showed no acute abnormality.  CTA head and neck showed LVO at left MCA branch and right ICA occlusion which appeared to be chronic.  MR brain is pending.  Patient arrived as a code stroke and was seen by neurology.  Initial plan was for IR intervention however she had improvement in her deficits prior to this and this has been deferred.  Plan for stroke workup on medicine service.  Review of Systems: As per HPI otherwise all other systems reviewed and are negative.  Past Medical History:  Diagnosis Date   Atrial fibrillation (HCC)    Breast cancer (HCC)    Breast cancer, left (HCC)    Chronic kidney disease    stage III - patient was unaware   Diabetes mellitus    Dyspnea    Dysrhythmia    Afib   H/O: hysterectomy    Hardware complicating wound infection (HCC) 08/28/2022   History of colonoscopy 01/25/2010   History of mammogram 08/04/2009   Hyperlipidemia    Hypertension     Hypothyroidism    Ketoacidosis, diabetic, no coma, non-insulin  dependent    Type II   Presence of permanent cardiac pacemaker    Proteus infection 08/28/2022   Pruritus 07/10/2022   Vaccine counseling 12/31/2022   Vitamin B12 deficiency     Past Surgical History:  Procedure Laterality Date   ABDOMINAL HYSTERECTOMY     BACK SURGERY     Bilateral foot surgery     BREAST EXCISIONAL BIOPSY Left 12/2015   BREAST LUMPECTOMY Left    2017   BREAST LUMPECTOMY WITH RADIOACTIVE SEED AND SENTINEL LYMPH NODE BIOPSY Left 01/19/2016   Procedure: LEFT BREAST LUMPECTOMY WITH RADIOACTIVE SEED AND SENTINEL LYMPH NODE BIOPSY;  Surgeon: Lillette Reid III, MD;  Location: [facility] OR;  Service: General;  Laterality: Left;   BREAST LUMPECTOMY WITH RADIOACTIVE SEED LOCALIZATION Left 01/19/2016   CARDIOVERSION  02/27/2011   Procedure: CARDIOVERSION;  Surgeon: Peder Bourdon, MD;  Location: [facility] OR;  Service: Cardiovascular;  Laterality: N/A;   INSERT / REPLACE / REMOVE PACEMAKER     LAPAROSCOPIC CHOLECYSTECTOMY     LUMBAR WOUND DEBRIDEMENT N/A 12/20/2020   Procedure: LUMBAR WOUND DEBRIDEMENT WITH PLACEMENTOF LUMBAR WOUND VAC;  Surgeon: Garry Kansas, MD;  Location: [facility] OR;  Service: Neurosurgery;  Laterality: N/A;   PACEMAKER IMPLANT N/A 12/02/2018   Procedure: PACEMAKER IMPLANT;  Surgeon: Tammie Fall, MD;  Location: [facility] INVASIVE CV LAB;  Service: Cardiovascular;  Laterality: N/A;   ROTATOR CUFF REPAIR Left    TUBAL LIGATION  WOUND EXPLORATION N/A 01/19/2022   Procedure: REVISION OF LUMBAR WOUND;  Surgeon: Garry Kansas, MD;  Location: [facility] OR;  Service: Neurosurgery;  Laterality: N/A;  3C    Social History  reports that she has never smoked. She has never been exposed to tobacco smoke. She has never used smokeless tobacco. She reports that she does not drink alcohol  and does not use drugs.  Allergies  Allergen Reactions   Bee Venom Shortness Of Breath, Nausea And Vomiting and Other (See Comments)    Makes  the patient feel faint, also   Penicillins Anaphylaxis, Hives, Swelling and Other (See Comments)    Has patient had a PCN reaction causing immediate rash, facial/tongue/throat swelling, SOB or lightheadedness with hypotension: Yes Has patient had a PCN reaction causing severe rash involving mucus membranes or skin necrosis: No Has patient had a PCN reaction that required hospitalization: No Has patient had a PCN reaction occurring within the last 10 years: No If all of the above answers are "NO", then may proceed with Cephalosporin use.    Lisinopril Cough   Zocor [Simvastatin] Other (See Comments)    memory changes   Liraglutide Rash and Other (See Comments)    Rash at injection site    Family History  Problem Relation Age of Onset   Heart failure Father 69       enlarged heart   Pneumonia Mother 37   Diabetes Mother 50   Breast cancer Sister    Cancer Neg Hx   Reviewed on admission  Prior to Admission medications   Medication Sig Start Date End Date Taking? Authorizing Provider  amLODipine  (NORVASC ) 5 MG tablet TAKE 1 TABLET BY MOUTH DAILY IN  THE MORNING 03/27/23   Odie Benne, MD  apixaban  (ELIQUIS ) 2.5 MG TABS tablet Take 2.5 mg by mouth 2 (two) times daily. 07/08/21   [provider]  cefdinir  (OMNICEF ) 300 MG capsule Take 1 capsule (300 mg total) by mouth daily. 08/28/22   Charolette Copier, MD  Cyanocobalamin  (B-12 PO) Take 1 tablet by mouth daily.    [provider]  ferrous sulfate  325 (65 FE) MG tablet Take 325 mg by mouth daily with breakfast.    [provider]  insulin  aspart (NOVOLOG ) 100 UNIT/ML injection 0-15 Units, Subcutaneous, 3 times daily with meals, CBG < 70: Implement Hypoglycemia measures CBG 70 - 120: 0 units CBG 121 - 150: 2 units CBG 151 - 200: 3 units CBG 201 - 250: 5 units CBG 251 - 300: 8 units CBG 301 - 350: 11 units CBG 351 - 400: 15 units CBG > 400: call MD 12/01/20   Burton Casey, MD  irbesartan  (AVAPRO )  300 MG tablet Take 300 mg by mouth daily.    [provider]  levothyroxine  (SYNTHROID ) 137 MCG tablet Take 137 mcg by mouth daily before breakfast. 07/02/18   [provider]  metFORMIN  (GLUCOPHAGE ) 850 MG tablet Take 850 mg by mouth 2 (two) times daily with a meal.    [provider]  metoprolol  succinate (TOPROL -XL) 25 MG 24 hr tablet TAKE 1 TABLET BY MOUTH ONCE  DAILY 10/09/22   Odie Benne, MD  Multiple Vitamins-Minerals (MULTIVITAMIN WITH MINERALS) tablet Take 1 tablet by mouth in the morning.    [provider]  spironolactone  (ALDACTONE ) 25 MG tablet Take 25 mg by mouth in the morning.    [provider]  timolol  (TIMOPTIC ) 0.5 % ophthalmic solution Place 1 drop into both eyes  every morning. 05/22/20   [provider]  TOUJEO  SOLOSTAR 300 UNIT/ML Solostar Pen Inject 10-15 Units into the skin See admin instructions. Inject 15 units into the skin in the morning and then inject 10 units at night per patient 09/23/21   [provider]    Physical Exam: Vitals:   08/15/23 1040 08/15/23 1100 08/15/23 1119  BP: (!) 156/78    Pulse: 75 69   Resp:  18   SpO2:  97%   Weight:   93.2 kg  Height:   5\' 4"  (1.626 m)    Physical Exam Constitutional:      General: She is not in acute distress.    Appearance: Normal appearance.  HENT:     Head: Normocephalic and atraumatic.     Mouth/Throat:     Mouth: Mucous membranes are moist.     Pharynx: Oropharynx is clear.  Eyes:     Extraocular Movements: Extraocular movements intact.     Pupils: Pupils are equal, round, and reactive to light.  Cardiovascular:     Rate and Rhythm: Normal rate and regular rhythm.     Pulses: Normal pulses.     Heart sounds: Normal heart sounds.  Pulmonary:     Effort: Pulmonary effort is normal. No respiratory distress.     Breath sounds: Normal breath sounds.  Abdominal:     General: Bowel sounds are normal. There is no distension.      Palpations: Abdomen is soft.     Tenderness: There is no abdominal tenderness.  Musculoskeletal:        General: No swelling or deformity.  Skin:    General: Skin is warm and dry.  Neurological:     Comments: Mental Status: Patient is awake, alert, oriented Minimal expressive aphasia Cranial Nerves: II: Pupils equal, round, and reactive to light.   III,IV, VI: EOMI without ptosis or diploplia.  V: Facial sensation is symmetric to light touch. VII: Facial movement is symmetric.  VIII: hearing is intact to voice X: Uvula elevates symmetrically XI: Shoulder shrug is symmetric. XII: tongue is midline without atrophy or fasciculations.  Motor: Good effort thorughout, at Least 5/5 LUE, 4-5/6 RUE, 5/5 bilateral lower extremitiy  Sensory: Sensation is grossly intact bilateral UEs & LEs Cerebellar: Finger-Nose intact bilalat    Labs on Admission: I have personally reviewed following labs and imaging studies  CBC: Recent Labs  Lab 08/15/23 1039 08/15/23 1041  WBC 10.3  --   NEUTROABS 6.9  --   HGB 13.5 14.3  HCT 42.0 42.0  MCV 96.3  --   PLT 280  --     Basic Metabolic Panel: Recent Labs  Lab 08/15/23 1039 08/15/23 1041  NA 138 139  K 4.5 4.4  CL 107 109  CO2 20*  --   GLUCOSE 222* 223*  BUN 32* 35*  CREATININE 1.96* 2.10*  CALCIUM  8.9  --     GFR: Estimated Creatinine Clearance: 20.9 mL/min (A) (by C-G formula based on SCr of 2.1 mg/dL (H)).  Liver Function Tests: Recent Labs  Lab 08/15/23 1039  AST 20  ALT 14  ALKPHOS 79  BILITOT 0.9  PROT 7.1  ALBUMIN 3.4*    Urine analysis:    Component Value Date/Time   COLORURINE YELLOW 11/25/2020 1304   APPEARANCEUR HAZY (A) 11/25/2020 1304   LABSPEC 1.016 11/25/2020 1304   PHURINE 5.0 11/25/2020 1304   GLUCOSEU NEGATIVE 11/25/2020 1304   HGBUR NEGATIVE 11/25/2020 1304   BILIRUBINUR NEGATIVE 11/25/2020  1304   KETONESUR NEGATIVE 11/25/2020 1304   PROTEINUR 30 (A) 11/25/2020 1304   NITRITE NEGATIVE  11/25/2020 1304   LEUKOCYTESUR MODERATE (A) 11/25/2020 1304    Radiological Exams on Admission: CT ANGIO HEAD NECK W WO CM (CODE STROKE) Result Date: 08/15/2023 CLINICAL DATA:  89 year old female.  Code stroke. EXAM: CT ANGIOGRAPHY HEAD AND NECK WITH AND WITHOUT CONTRAST TECHNIQUE: Multidetector CT imaging of the head and neck was performed using the standard protocol during bolus administration of intravenous contrast. Multiplanar CT image reconstructions and MIPs were obtained to evaluate the vascular anatomy. Carotid stenosis measurements (when applicable) are obtained utilizing NASCET criteria, using the distal internal carotid diameter as the denominator. RADIATION DOSE REDUCTION: This exam was performed according to the departmental dose-optimization program which includes automated exposure control, adjustment of the mA and/or kV according to patient size and/or use of iterative reconstruction technique. CONTRAST:  75mL OMNIPAQUE IOHEXOL 350 MG/ML SOLN COMPARISON:  Plain head CT 1046 hours today. FINDINGS: CTA NECK Skeleton: No acute osseous abnormality identified. Absent maxillary dentition. Spine degeneration. Upper chest: Negative. Other neck: Nonvascular neck soft tissue spaces are within normal limits. Aortic arch: Calcified aortic atherosclerosis.  3 vessel arch. Right carotid system: Brachiocephalic artery and plaque without stenosis. Dense right subclavian venous contrast streak artifact affecting visualization of the proximal right CCA. However, before the level of the right carotid bifurcation there is relatively poor CCA enhancement compared to the left side (series 9, image 109). Faint enhancement through the right carotid bifurcation but the right ICA is definitely occluded by the level of the distal bulb (series 9, image 91). And there is no reconstitution to the skull base. Left carotid system: Tortuous proximal left CCA with mild calcified plaque at the origin. Mild for age calcified  plaque at the left carotid bifurcation. Tortuosity in the neck below the skull base but no significant stenosis in the neck. Vertebral arteries: Calcified right subclavian artery origin without stenosis. Calcified plaque near the right vertebral artery origin without significant stenosis. Right vertebral is patent to the skull base with no additional plaque or stenosis. Proximal left subclavian soft and calcified plaque without stenosis. Patent left vertebral artery origin. Tortuous left V1 segment. Fairly codominant left vertebral artery is patent to the skull base with no significant plaque or stenosis. CTA HEAD Posterior circulation: Distal ritual arteries, PICA origins, basilar artery, SCA and right PCA origins are patent without significant plaque or stenosis. There is a fetal type left PCA. A small right posterior communicating artery is present. Bilateral PCA branches are within normal limits. Anterior circulation: Right ICA siphon is occluded until reconstituted right MCA and ACA origins, right ICA terminus just distal to the right posterior communicating artery level. Slightly asymmetrically decreased right MCA and ACA compared to the left side. But relatively symmetric level of enhancement. No right MCA or ACA branch occlusion identified. Left ICA siphon is patent with calcified plaque most pronounced in the supraclinoid segment, mild supraclinoid stenosis suspected on series 11, image 100. Patent left ICA terminus. Normal left posterior communicating artery. Left ACA A1, anterior communicating artery, proximal ACA branches are within normal limits. Left MCA M1 is patent and bifurcates relatively early without stenosis (series 13, image 32. Dominant superior division at that bifurcation then bifurcates a 2nd time without stenosis on series 11, image 124. But distal to that middle MCA division branch is occluded at its origin on series 11, image 132. This occlusion is also visible on series 7, images 109  through  107. And there is decreased middle left MCA division vessel density when compared to the right side. Venous sinuses: Early contrast timing, not evaluated. Anatomic variants: Fetal type left PCA origin. Review of the MIP images confirms the above findings Preliminary report of the above discussed by telephone with Dr. Greg Leaks on 08/15/2023 at 11:03 . IMPRESSION: 1. Positive for suspected ELVO: Left MCA middle division branch seen best on series 11, image 132. This is an M3 versus very distal M2 occlusion in light of somewhat early Left MCA bifurcation upstream of this. 2. Positive also for completely occluded Right ICA at the distal bulb, poor upstream Right CCA enhancement, no reconstitution and told the right ICA terminus. But upon discussion with Dr. Greg Leaks , this is most likely chronic. And there is relatively good collateral enhancement of the right ACA and MCA. 3. Other atherosclerosis in the head and neck without significant stenosis, including Left ICA supraclinoid calcified plaque with mild stenosis. 4.  Aortic Atherosclerosis (ICD10-I70.0). Preliminary report of the above discussed by telephone with Dr. Greg Leaks on 08/15/2023 at 11:03 . Electronically Signed   By: Marlise Simpers M.D.   On: 08/15/2023 11:11   CT HEAD CODE STROKE WO CONTRAST Result Date: 08/15/2023 CLINICAL DATA:  Code stroke. 89 year old female with isolated aphasia. EXAM: CT HEAD WITHOUT CONTRAST TECHNIQUE: Contiguous axial images were obtained from the base of the skull through the vertex without intravenous contrast. RADIATION DOSE REDUCTION: This exam was performed according to the departmental dose-optimization program which includes automated exposure control, adjustment of the mA and/or kV according to patient size and/or use of iterative reconstruction technique. COMPARISON:  Head CT 11/25/2020. FINDINGS: Brain: Cerebral volume not significantly changed from 2022. No midline shift, ventriculomegaly, mass effect,  evidence of mass lesion, intracranial hemorrhage or evidence of cortically based acute infarction. Patchy chronic cerebral white matter hypodensity appears stable since 2022. Stable gray-white matter differentiation throughout the brain. Faint chronic basal ganglia vascular calcifications. Vascular: Calcified atherosclerosis at the skull base. No suspicious intracranial vascular hyperdensity. Skull: Stable and intact.  No acute osseous abnormality identified. Sinuses/Orbits: Visualized paranasal sinuses and mastoids are clear. Other: No acute orbit or scalp soft tissue finding. ASPECTS [facility] Stroke Program Early CT Score) Total score (0-10 with 10 being normal): 10 IMPRESSION: 1. No acute cortically based infarct or acute intracranial hemorrhage identified. ASPECTS 10. 2. Stable non contrast CT appearance of the brain since 2022. Mild for age white matter disease. 3. These results were communicated to Dr. Doretta Gant at 10:50 am on 08/15/2023 by text page via the [facility] messaging system. Electronically Signed   By: Marlise Simpers M.D.   On: 08/15/2023 10:51   EKG: Ordered in the ED but not yet available for review.  Assessment/Plan Active Problems:   Breast cancer of upper-outer quadrant of left female breast (HCC)   Chronic diastolic CHF (congestive heart failure) (HCC)   Essential hypertension   Permanent atrial fibrillation (HCC)   Symptomatic bradycardia   Pacemaker   Type 2 diabetes mellitus without complication, with long-term current use of insulin  (HCC)   Hypothyroidism   CKD (chronic kidney disease), stage III (HCC)   Morbid obesity (HCC)   Acute CVA > Presented with acute onset facial droop and aphasia/slurred speech. > Seen by neurology on arrival.   > CT head showed no acute Fredy Jer[place]o.  But CTA head and neck showed LVO of the left MCA branch and right ICA occlusion. > Initial plan was for IR intervention due to LVO  of the left MCA branch however symptoms improved in the ED prior to this likely  representing clot clearing. > On Eliquis  for atrial fibrillation.  Thought she was supposed to take her medication only daily at 2.5 mg; however is prescribed for the normal frequency of twice daily. - Appreciate neurology recommendations - Allow for permissive HTN (systolic < 220 and diastolic < 120)  - Antiplatelets per neurology - Statin pending lipid panel results  - Echocardiogram  - A1C  - Lipid panel  - Tele monitoring  - SLP eval - PT/OT ADDENDUM > After initial improvement in aphasia, patient noted to have mild aphasia again for me and continued to wax and wane at times being severe. > Repeat code stroke was called and patient imaging repeated.  Patient noted to have continued issues at her left M2 branch but improving.  Discussion was held between neurology, patient, family, neurointerventional radiology and decision was made to monitor closely in the ICU and hold off on intervention for now with vasculature improving though some clot remaining but nonocclusive. - Close monitoring in the ICU overnight with frequent neurochecks [facility] service will remain primary for now, if neurointervention required neurology service will take over his primary  Hypertension - Permissive hypertension as above  Diabetes - 5 units twice daily long-acting - SSI  Hypothyroidism - Continue home Synthroid   Atrial fibrillation - Continue home Eliquis  - Holding metoprolol  as above  Bradycardia > History of symptomatic bradycardia - Has pacemaker  CKD 3 > Creatinine stable at 1.96 from last year.  Was 1.62 years ago. - Trend renal function and electrolytes  Diastolic CHF > Last echo was in 2019 with EF 55-60%, indeterminate diastolic function, normal RV function. - Echocardiogram as above - Holding spironolactone , irbesartan , metoprolol  as above  History of breast cancer - Noted  Obesity - Noted   DVT prophylaxis: Eliquis  Code Status:   DNR/DNI  Family Communication:  None on  admission  Disposition Plan:   Patient is from:  Home  Anticipated DC to:  Home  Anticipated DC date:  1 to 3 days  Anticipated DC barriers: None  Consults called:  Neurology Admission status:  Observation, telemetry  Severity of Illness: The appropriate patient status for this patient is OBSERVATION. Observation status is judged to be reasonable and necessary in order to provide the required intensity of service to ensure the patient's safety. The patient's presenting symptoms, physical exam findings, and initial radiographic and laboratory data in the context of their medical condition is felt to place them at decreased risk for further clinical deterioration. Furthermore, it is anticipated that the patient will be medically stable for discharge from the hospital within 2 midnights of admission.    Johnetta Nab MD [facility]  How to contact the [facility] Attending or Consulting provider 7A - 7P or covering provider during after hours 7P -7A, for this patient?   Check the care team in [facility] and look for a) attending/consulting [facility] provider listed and b) the [facility] team listed Log into [email or web address] and use [place]'s universal password to access. If you do not have the password, please contact the hospital operator. Locate the [facility] provider you are looking for under [facility] and page to a number that you can be directly reached. If you still have difficulty reaching the provider, please page the [facility] (Director on Call) for the Hospitalists listed on amion for assistance.  08/15/2023, 12:29 PM

## 2023-08-15 NOTE — Consult Note (Signed)
 NEUROLOGY CONSULT NOTE   Date of service: August 15, 2023 Patient Name: Summer Hawkins MRN:  [id number] DOB:  [birth year] Chief Complaint: "Code stroke " Requesting Provider: Hershel Los, MD  History of Present Illness  Summer Hawkins is a 89 y.o. female with hx of PAfib on Eliquis  ( takes once per day), s/p PPM, CKD, DM, HTN, HLD, hypothyroidism, B12 deficiency who presents to [facility] ED via EMS. LKW [id number]. Code stroke activated by EMS. Per EMS an family friend Summer Hawkins, they were out eating breakfast at the restaurant when friend went to pay the bill and came back to table and found her with a facial droop and inability to speak. CT head with no acute process, aspects 10. CTA head and neck positive for LVO of left MCA middle division, M3 versus very distal M2 occlusion in light of somewhat early Left MCA bifurcation upstream  of this; Positive also for completely occluded Right ICA at the distal bulb, poor upstream Right CCA enhancement most likely chronic  NIHSS 12. Patient speech is greatly improved after CT scan and when reevaluated NIHSS 5  LKW: 0950 Modified rankin score: 3-Moderate disability-requires help but walks WITHOUT assistance IV Thrombolysis: No on Eliquis   EVT: Not a candidate due to symptoms rapidly improving   NIHSS components Score: Comment  1a Level of Conscious 0[x]  1[]  2[]  3[]      1b LOC Questions 0[]  1[x]  2[]       1c LOC Commands 0[x]  1[]  2[]       2 Best Gaze 0[x]  1[]  2[]       3 Visual 0[x]  1[]  2[]  3[]      4 Facial Palsy 0[]  1[x]  2[]  3[]      5a Motor Arm - left 0[x]  1[]  2[]  3[]  4[]  UN[]    5b Motor Arm - Right 0[x]  1[]  2[]  3[]  4[]  UN[]    6a Motor Leg - Left 0[]  1[]  2[]  3[x]  4[]  UN[]    6b Motor Leg - Right 0[]  1[]  2[x]  3[]  4[]  UN[]    7 Limb Ataxia 0[]  1[x]  2[]  UN[]      8 Sensory 0[x]  1[]  2[]  UN[]      9 Best Language 0[]  1[]  2[x]  3[]      10 Dysarthria 0[]  1[]  2[x]  UN[]      11 Extinct. and Inattention 0[x]  1[]  2[]       TOTAL: 12      ROS   Comprehensive ROS Unable to  ascertain due to aphasia   Past History   Past Medical History:  Diagnosis Date   Atrial fibrillation (HCC)    Breast cancer (HCC)    Breast cancer, left (HCC)    Chronic kidney disease    stage III - patient was unaware   Diabetes mellitus    Dyspnea    Dysrhythmia    Afib   H/O: hysterectomy    Hardware complicating wound infection (HCC) 08/28/2022   History of colonoscopy 01/25/2010   History of mammogram 08/04/2009   Hyperlipidemia    Hypertension    Hypothyroidism    Ketoacidosis, diabetic, no coma, non-insulin  dependent    Type II   Presence of permanent cardiac pacemaker    Proteus infection 08/28/2022   Pruritus 07/10/2022   Vaccine counseling 12/31/2022   Vitamin B12 deficiency     Past Surgical History:  Procedure Laterality Date   ABDOMINAL HYSTERECTOMY     BACK SURGERY     Bilateral foot surgery     BREAST EXCISIONAL BIOPSY Left 12/2015   BREAST LUMPECTOMY Left    2017  BREAST LUMPECTOMY WITH RADIOACTIVE SEED AND SENTINEL LYMPH NODE BIOPSY Left 01/19/2016   Procedure: LEFT BREAST LUMPECTOMY WITH RADIOACTIVE SEED AND SENTINEL LYMPH NODE BIOPSY;  Surgeon: Lillette Reid III, MD;  Location: [facility] OR;  Service: General;  Laterality: Left;   BREAST LUMPECTOMY WITH RADIOACTIVE SEED LOCALIZATION Left 01/19/2016   CARDIOVERSION  02/27/2011   Procedure: CARDIOVERSION;  Surgeon: Peder Bourdon, MD;  Location: [facility] OR;  Service: Cardiovascular;  Laterality: N/A;   INSERT / REPLACE / REMOVE PACEMAKER     LAPAROSCOPIC CHOLECYSTECTOMY     LUMBAR WOUND DEBRIDEMENT N/A 12/20/2020   Procedure: LUMBAR WOUND DEBRIDEMENT WITH PLACEMENTOF LUMBAR WOUND VAC;  Surgeon: Garry Kansas, MD;  Location: [facility] OR;  Service: Neurosurgery;  Laterality: N/A;   PACEMAKER IMPLANT N/A 12/02/2018   Procedure: PACEMAKER IMPLANT;  Surgeon: Tammie Fall, MD;  Location: [facility] INVASIVE CV LAB;  Service: Cardiovascular;  Laterality: N/A;   ROTATOR CUFF REPAIR Left    TUBAL LIGATION     WOUND EXPLORATION  N/A 01/19/2022   Procedure: REVISION OF LUMBAR WOUND;  Surgeon: Garry Kansas, MD;  Location: [facility] OR;  Service: Neurosurgery;  Laterality: N/A;  3C    Family History: Family History  Problem Relation Age of Onset   Heart failure Father 73       enlarged heart   Pneumonia Mother 58   Diabetes Mother 71   Breast cancer Sister    Cancer Neg Hx     Social History  reports that she has never smoked. She has never been exposed to tobacco smoke. She has never used smokeless tobacco. She reports that she does not drink alcohol  and does not use drugs.  Allergies  Allergen Reactions   Bee Venom Shortness Of Breath, Nausea And Vomiting and Other (See Comments)    Makes the patient feel faint, also   Penicillins Anaphylaxis, Hives, Swelling and Other (See Comments)    Has patient had a PCN reaction causing immediate rash, facial/tongue/throat swelling, SOB or lightheadedness with hypotension: Yes Has patient had a PCN reaction causing severe rash involving mucus membranes or skin necrosis: No Has patient had a PCN reaction that required hospitalization: No Has patient had a PCN reaction occurring within the last 10 years: No If all of the above answers are "NO", then may proceed with Cephalosporin use.    Lisinopril Cough   Zocor [Simvastatin] Other (See Comments)    memory changes   Liraglutide Rash and Other (See Comments)    Rash at injection site    Medications  No current facility-administered medications for this encounter.  Current Outpatient Medications:    amLODipine  (NORVASC ) 5 MG tablet, TAKE 1 TABLET BY MOUTH DAILY IN  THE MORNING, Disp: 90 tablet, Rfl: 3   apixaban  (ELIQUIS ) 2.5 MG TABS tablet, Take 2.5 mg by mouth 2 (two) times daily., Disp: , Rfl:    cefdinir  (OMNICEF ) 300 MG capsule, Take 1 capsule (300 mg total) by mouth daily., Disp: 30 capsule, Rfl: 5   Cyanocobalamin  (B-12 PO), Take 1 tablet by mouth daily., Disp: , Rfl:    ferrous sulfate  325 (65 FE) MG tablet,  Take 325 mg by mouth daily with breakfast., Disp: , Rfl:    insulin  aspart (NOVOLOG ) 100 UNIT/ML injection, 0-15 Units, Subcutaneous, 3 times daily with meals, CBG < 70: Implement Hypoglycemia measures CBG 70 - 120: 0 units CBG 121 - 150: 2 units CBG 151 - 200: 3 units CBG 201 - 250: 5 units CBG 251 - 300: 8 units CBG  301 - 350: 11 units CBG 351 - 400: 15 units CBG > 400: call MD, Disp: 10 mL, Rfl: 11   irbesartan  (AVAPRO ) 300 MG tablet, Take 300 mg by mouth daily., Disp: , Rfl:    levothyroxine  (SYNTHROID ) 137 MCG tablet, Take 137 mcg by mouth daily before breakfast., Disp: , Rfl:    metFORMIN  (GLUCOPHAGE ) 850 MG tablet, Take 850 mg by mouth 2 (two) times daily with a meal., Disp: , Rfl:    metoprolol  succinate (TOPROL -XL) 25 MG 24 hr tablet, TAKE 1 TABLET BY MOUTH ONCE  DAILY, Disp: 90 tablet, Rfl: 3   Multiple Vitamins-Minerals (MULTIVITAMIN WITH MINERALS) tablet, Take 1 tablet by mouth in the morning., Disp: , Rfl:    spironolactone  (ALDACTONE ) 25 MG tablet, Take 25 mg by mouth in the morning., Disp: , Rfl:    timolol  (TIMOPTIC ) 0.5 % ophthalmic solution, Place 1 drop into both eyes every morning., Disp: , Rfl:    TOUJEO  SOLOSTAR 300 UNIT/ML Solostar Pen, Inject 10-15 Units into the skin See admin instructions. Inject 15 units into the skin in the morning and then inject 10 units at night per patient, Disp: , Rfl:   Vitals  There were no vitals filed for this visit.  There is no height or weight on file to calculate BMI.  Physical Exam   Constitutional: Appears well-developed and well-nourished.   Psych: Affect appropriate to situation.   Eyes: No scleral injection.   HENT: No OP obstruction.   Head: Normocephalic.   Cardiovascular: Normal rate and regular rhythm.   Respiratory: Effort normal, non-labored breathing.   GI: Soft.  No distension. There is no tenderness.   Skin: WDI.    Neurologic Examination   Mental Status -  Level of arousal and orientation to self and age. Moderate  expressive aphasia.  Attention span and concentration were normal. Recent and remote memory were intact. Fund of Knowledge was assessed and was intact.  Cranial Nerves II - XII - II - Visual field intact OU . III, IV, VI - Extraocular movements intact . V - Facial sensation intact bilaterally . VII - right racial droop VIII - Hearing & vestibular intact bilaterally . X - Palate elevates symmetrically . XI - Chin turning & shoulder shrug intact bilaterally . XII - Tongue protrusion intact .  Motor Strength - bilateral uppers 5/5,   left leg unable to lift off bed, right leg with drift Bulk was normal and fasciculations were absent .   Motor Tone - Muscle tone was assessed at the neck and appendages and was normal . Sensory - Light touch, temperature/pinprick were assessed and were symmetrical.   Coordination - right arm ataxia Tremor was absent . Gait and Station - deferred.  Labs/Imaging/Neurodiagnostic studies   CBC:  Recent Labs  Lab 08-22-2023 1039 2023/08/22 1041  WBC 10.3  --   NEUTROABS 6.9  --   HGB 13.5 14.3  HCT 42.0 42.0  MCV 96.3  --   PLT 280  --    Basic Metabolic Panel:  Lab Results  Component Value Date   NA 139 08-22-23   K 4.4 2023-08-22   CO2 16 (L) 06/07/2022   GLUCOSE 223 (H) 2023/08/22   BUN 35 (H) 08-22-23   CREATININE 2.10 (H) 2023-08-22   CALCIUM  9.9 06/07/2022   GFRNONAA 24 (L) 01/18/2022   GFRAA 41 (L) 12/01/2018   Lipid Panel: No results found for: "LDLCALC" HgbA1c:  Lab Results  Component Value Date   HGBA1C 6.9 (H)  01/18/2022   Urine Drug Screen: No results found for: "LABOPIA", "COCAINSCRNUR", "LABBENZ", "AMPHETMU", "THCU", "LABBARB"  Alcohol  Level No results found for: "ETH" INR  Lab Results  Component Value Date   INR 1.0 08/15/2023   APTT  Lab Results  Component Value Date   APTT 30 08/15/2023   AED levels: No results found for: "PHENYTOIN", "ZONISAMIDE", "LAMOTRIGINE", "LEVETIRACETA"  CT Head without  contrast(Personally reviewed):  no acute process, aspects 10.  CT angio Head and Neck with contrast(Personally reviewed): positive for LVO of left MCA middle division, M3 versus very distal M2 occlusion in light of somewhat early Left MCA bifurcation upstream of this; Positive also for completely occluded Right ICA at the distal bulb, poor upstream Right CCA enhancement most likely chronic     ASSESSMENT   Summer Hawkins is a 89 y.o. female PAfib on Eliquis  ( takes once per day), s/p PPM, CKD, DM, HTN, HLD, hypothyroidism, B12 deficiency who presents to [facility] ED via EMS. LKW [id number].  CT head with no acute process, aspects 10. CTA head and neck positive for LVO of left MCA middle division, M3 versus very distal M2 occlusion in light of somewhat early Left MCA bifurcation upstream of this; Positive also for completely occluded Right ICA at the distal bulb, poor upstream Right CCA enhancement most likely chronic  L M2 intervention was considered however patient's aphasia nearly resolved therefore risk was felt to outweigh any benefit and was not performed.   RECOMMENDATIONS  - admit to medical hospitalist for stroke workup  - HgbA1c, fasting lipid panel - MRI of the brain without contrast - Frequent neuro checks - Echocardiogram - Hold Eliquis  at this time until after MRI brain obtained  - permissive hypertension  - Risk factor modification - Telemetry monitoring - PT consult, OT consult, Speech consult - Stroke team to follow ______________________________________________________________________    Signed, Laymond Priestly, NP [facility]   Attending Neurohospitalist Addendum Patient seen and examined with APP/Resident. Agree with the history and physical as documented above. Agree with the plan as documented, which I helped formulate. I have edited the note above to reflect my full findings and recommendations. I have independently reviewed the chart, obtained history,  review of systems and examined the patient.I have personally reviewed pertinent head/neck/spine imaging (CT/MRI). Please feel free to call with any questions.  -- Greg Leaks, MD [facility] [phone]  If 7pm- 7am, please page neurology on call as listed in AMION.

## 2023-08-15 NOTE — Evaluation (Signed)
 Speech Language Pathology Evaluation Patient Details Name: Summer Hawkins MRN: [id number] DOB: [birth year] Today's Date: 08/15/2023 Time: 9562-1308 SLP Time Calculation (min) (ACUTE ONLY): 27 min  Problem List:  Patient Active Problem List   Diagnosis Date Noted   Acute CVA (cerebrovascular accident) (HCC) 08/15/2023   Vaccine counseling 12/31/2022   Hardware complicating wound infection (HCC) 08/28/2022   Proteus infection 08/28/2022   Pruritus 07/10/2022   Open wound of lumbar region 01/19/2022   Arthritis of hip 11/01/2021   History of lumbar fusion 11/01/2021   Hip pain 11/01/2021   Postoperative complication of skin involving drainage from surgical wound 12/20/2020   Sepsis (HCC) 11/26/2020   Back pain with history of spinal surgery 11/26/2020   Gram-negative bacteremia 11/26/2020   Sepsis due to urinary tract infection (HCC) 11/25/2020   Acute metabolic encephalopathy 11/25/2020   Type 2 diabetes mellitus without complication, with long-term current use of insulin  (HCC) 11/25/2020   Hypothyroidism 11/25/2020   CKD (chronic kidney disease), stage III (HCC) 11/25/2020   Spondylolisthesis, lumbar region 11/17/2020   Posterior capsular opacification, left 03/31/2020   Posterior vitreous detachment of both eyes 03/31/2020   Intermediate stage nonexudative age-related macular degeneration of both eyes 09/25/2019   Moderate nonproliferative diabetic retinopathy of both eyes (HCC) 09/25/2019   Pacemaker 03/20/2019   Symptomatic bradycardia 11/30/2018   Syncope and collapse 11/21/2018   Carpal tunnel syndrome of right wrist 10/24/2018   Bradycardia 09/25/2017   Permanent atrial fibrillation (HCC)    Chronic diastolic CHF (congestive heart failure) (HCC) 08/08/2017   Essential hypertension 08/08/2017   Breast cancer of upper-outer quadrant of left female breast (HCC) 01/05/2016   Chronic atrial fibrillation (HCC) 01/05/2011   Morbid obesity (HCC) 12/29/2010   Past Medical  History:  Past Medical History:  Diagnosis Date   Atrial fibrillation (HCC)    Breast cancer (HCC)    Breast cancer, left (HCC)    Chronic kidney disease    stage III - patient was unaware   Diabetes mellitus    Dyspnea    Dysrhythmia    Afib   H/O: hysterectomy    Hardware complicating wound infection (HCC) 08/28/2022   History of colonoscopy 01/25/2010   History of mammogram 08/04/2009   Hyperlipidemia    Hypertension    Hypothyroidism    Ketoacidosis, diabetic, no coma, non-insulin  dependent    Type II   Presence of permanent cardiac pacemaker    Proteus infection 08/28/2022   Pruritus 07/10/2022   Vaccine counseling 12/31/2022   Vitamin B12 deficiency    Past Surgical History:  Past Surgical History:  Procedure Laterality Date   ABDOMINAL HYSTERECTOMY     BACK SURGERY     Bilateral foot surgery     BREAST EXCISIONAL BIOPSY Left 12/2015   BREAST LUMPECTOMY Left    2017   BREAST LUMPECTOMY WITH RADIOACTIVE SEED AND SENTINEL LYMPH NODE BIOPSY Left 01/19/2016   Procedure: LEFT BREAST LUMPECTOMY WITH RADIOACTIVE SEED AND SENTINEL LYMPH NODE BIOPSY;  Surgeon: Lillette Reid III, MD;  Location: [facility] OR;  Service: General;  Laterality: Left;   BREAST LUMPECTOMY WITH RADIOACTIVE SEED LOCALIZATION Left 01/19/2016   CARDIOVERSION  02/27/2011   Procedure: CARDIOVERSION;  Surgeon: Peder Bourdon, MD;  Location: [facility] OR;  Service: Cardiovascular;  Laterality: N/A;   INSERT / REPLACE / REMOVE PACEMAKER     LAPAROSCOPIC CHOLECYSTECTOMY     LUMBAR WOUND DEBRIDEMENT N/A 12/20/2020   Procedure: LUMBAR WOUND DEBRIDEMENT WITH PLACEMENTOF LUMBAR WOUND VAC;  Surgeon: Garry Kansas, MD;  Location: [facility] OR;  Service: Neurosurgery;  Laterality: N/A;   PACEMAKER IMPLANT N/A 12/02/2018   Procedure: PACEMAKER IMPLANT;  Surgeon: Tammie Fall, MD;  Location: [facility] INVASIVE CV LAB;  Service: Cardiovascular;  Laterality: N/A;   ROTATOR CUFF REPAIR Left    TUBAL LIGATION     WOUND EXPLORATION N/A 01/19/2022    Procedure: REVISION OF LUMBAR WOUND;  Surgeon: Garry Kansas, MD;  Location: [facility] OR;  Service: Neurosurgery;  Laterality: N/A;  3C   HPI:  Patient is an 89 y.o. female with PMH: a-fib, breast cancer, CKD, HLD, dyspnea, bradycardia, HTN. She presented to the [facility] ED on 08/15/2023 as code stroke with facial droop, slurred speech. In ED, BP in 130's-150's systolic, CT head negative for intracranial abnormality, CTA head and neck showed LVO at left MCA branch and right ICA occlusion. MRI brain is pending.   Assessment / Plan / Recommendation Clinical Impression  Patient is presenting with a mild dysarthria, a primary expressive aphasia but with receptive language appearing WFL. Basic level cognitive function appears WFL but complex level cognition was not assessed. Initially during assessment, patient exhibited phonemic paraphasias and dysfluencies with initial part word repetitions which she was not aware of. She was able to complete divergent naming task and picture description task accurately with mild amount of phonemic paraphasias only. In addition, she was able to have a conversation with SLP about her time working in [place] with a two year position working in the [employer] under [employer]. Approximately 20 minutes after evaluation had begun, patient with sudden and significant decline in her expressive language, with patient producing very few real words. She did demonstrate awareness to this and was able to get out some phrases such as "I know it", "it won't come out". After a few minutes, patient's expressive language did improve but did not return to the level it was when evaluation started. SLP informed patient's RN and MD. SLP will follow acutely and she will likely benefit from skilled SLP at next venue as well.    SLP Assessment  SLP Recommendation/Assessment: Patient needs continued Speech Lanaguage Pathology Services SLP Visit Diagnosis: Dysarthria and anarthria (R47.1);Aphasia (R47.01)     Recommendations for follow up therapy are one component of a multi-disciplinary discharge planning process, led by the attending physician.  Recommendations may be updated based on patient status, additional functional criteria and insurance authorization.    Follow Up Recommendations  Other (comment) (SLP at next venue of care)    Assistance Recommended at Discharge  PRN  Functional Status Assessment Patient has had a recent decline in their functional status and demonstrates the ability to make significant improvements in function in a reasonable and predictable amount of time.  Frequency and Duration min 2x/week  2 weeks      SLP Evaluation Cognition  Overall Cognitive Status: Difficult to assess Orientation Level: Oriented X4 Year: 2025 Month: June Day of Week: Correct Attention: Sustained Sustained Attention: Appears intact Safety/Judgment: Appears intact       Comprehension  Auditory Comprehension Overall Auditory Comprehension: Appears within functional limits for tasks assessed    Expression Expression Primary Mode of Expression: Verbal Verbal Expression Overall Verbal Expression: Impaired Initiation: No impairment Naming: Impairment Confrontation: Impaired Divergent: 75-100% accurate Verbal Errors: Phonemic paraphasias;Semantic paraphasias;Jargon;Other (comment) (aware of errors/difficulty in general but not specifically) Pragmatics: No impairment Non-Verbal Means of Communication: Not applicable Written Expression Dominant Hand: Right Written Expression: Not tested   Oral / Motor  Oral Motor/Sensory Function Overall Oral  Motor/Sensory Function: Mild impairment Lingual ROM: Reduced right;Reduced left Lingual Symmetry: Within Functional Limits Lingual Strength: Reduced Motor Speech Overall Motor Speech: Impaired Respiration: Within functional limits Phonation: Normal Resonance: Within functional limits Articulation: Impaired Level of Impairment:  Word Intelligibility: Intelligibility reduced Word: 50-74% accurate Phrase: 50-74% accurate Motor Speech Errors: Not applicable Effective Techniques: Slow rate           Jacqualine Mater, MA, CCC-SLP Speech Therapy

## 2023-08-16 ENCOUNTER — Observation Stay (HOSPITAL_BASED_OUTPATIENT_CLINIC_OR_DEPARTMENT_OTHER)

## 2023-08-16 ENCOUNTER — Observation Stay (HOSPITAL_COMMUNITY)

## 2023-08-16 ENCOUNTER — Telehealth: Payer: Self-pay | Admitting: *Deleted

## 2023-08-16 DIAGNOSIS — I639 Cerebral infarction, unspecified: Secondary | ICD-10-CM | POA: Diagnosis not present

## 2023-08-16 DIAGNOSIS — Z794 Long term (current) use of insulin: Secondary | ICD-10-CM | POA: Diagnosis not present

## 2023-08-16 DIAGNOSIS — I63512 Cerebral infarction due to unspecified occlusion or stenosis of left middle cerebral artery: Secondary | ICD-10-CM

## 2023-08-16 DIAGNOSIS — C50412 Malignant neoplasm of upper-outer quadrant of left female breast: Secondary | ICD-10-CM | POA: Diagnosis not present

## 2023-08-16 DIAGNOSIS — I129 Hypertensive chronic kidney disease with stage 1 through stage 4 chronic kidney disease, or unspecified chronic kidney disease: Secondary | ICD-10-CM | POA: Diagnosis not present

## 2023-08-16 DIAGNOSIS — I69391 Dysphagia following cerebral infarction: Secondary | ICD-10-CM | POA: Diagnosis not present

## 2023-08-16 DIAGNOSIS — N183 Chronic kidney disease, stage 3 unspecified: Secondary | ICD-10-CM | POA: Diagnosis not present

## 2023-08-16 DIAGNOSIS — R29703 NIHSS score 3: Secondary | ICD-10-CM

## 2023-08-16 DIAGNOSIS — N1832 Chronic kidney disease, stage 3b: Secondary | ICD-10-CM | POA: Diagnosis not present

## 2023-08-16 DIAGNOSIS — I6389 Other cerebral infarction: Secondary | ICD-10-CM | POA: Diagnosis not present

## 2023-08-16 DIAGNOSIS — E039 Hypothyroidism, unspecified: Secondary | ICD-10-CM | POA: Diagnosis not present

## 2023-08-16 DIAGNOSIS — E1122 Type 2 diabetes mellitus with diabetic chronic kidney disease: Secondary | ICD-10-CM | POA: Diagnosis not present

## 2023-08-16 DIAGNOSIS — Z7901 Long term (current) use of anticoagulants: Secondary | ICD-10-CM

## 2023-08-16 DIAGNOSIS — I6602 Occlusion and stenosis of left middle cerebral artery: Secondary | ICD-10-CM | POA: Diagnosis not present

## 2023-08-16 DIAGNOSIS — I4891 Unspecified atrial fibrillation: Secondary | ICD-10-CM | POA: Diagnosis not present

## 2023-08-16 DIAGNOSIS — E785 Hyperlipidemia, unspecified: Secondary | ICD-10-CM

## 2023-08-16 DIAGNOSIS — Z955 Presence of coronary angioplasty implant and graft: Secondary | ICD-10-CM | POA: Diagnosis not present

## 2023-08-16 DIAGNOSIS — I6782 Cerebral ischemia: Secondary | ICD-10-CM | POA: Diagnosis not present

## 2023-08-16 DIAGNOSIS — Z79899 Other long term (current) drug therapy: Secondary | ICD-10-CM | POA: Diagnosis not present

## 2023-08-16 DIAGNOSIS — I4821 Permanent atrial fibrillation: Secondary | ICD-10-CM | POA: Diagnosis not present

## 2023-08-16 DIAGNOSIS — I5032 Chronic diastolic (congestive) heart failure: Secondary | ICD-10-CM | POA: Diagnosis not present

## 2023-08-16 DIAGNOSIS — R29810 Facial weakness: Secondary | ICD-10-CM | POA: Diagnosis not present

## 2023-08-16 DIAGNOSIS — I130 Hypertensive heart and chronic kidney disease with heart failure and stage 1 through stage 4 chronic kidney disease, or unspecified chronic kidney disease: Secondary | ICD-10-CM | POA: Diagnosis not present

## 2023-08-16 LAB — CBC
HCT: 39 % (ref 36.0–46.0)
Hemoglobin: 12.4 g/dL (ref 12.0–15.0)
MCH: 30.9 pg (ref 26.0–34.0)
MCHC: 31.8 g/dL (ref 30.0–36.0)
MCV: 97.3 fL (ref 80.0–100.0)
Platelets: 251 10*3/uL (ref 150–400)
RBC: 4.01 MIL/uL (ref 3.87–5.11)
RDW: 13.5 % (ref 11.5–15.5)
WBC: 10.4 10*3/uL (ref 4.0–10.5)
nRBC: 0 % (ref 0.0–0.2)

## 2023-08-16 LAB — ECHOCARDIOGRAM COMPLETE
AR max vel: 0.91 cm2
AV Area VTI: 0.99 cm2
AV Area mean vel: 0.83 cm2
AV Mean grad: 3 mmHg
AV Peak grad: 6 mmHg
Ao pk vel: 1.22 m/s
Calc EF: 68.2 %
Height: 64 in
S' Lateral: 2.3 cm
Single Plane A2C EF: 66.4 %
Single Plane A4C EF: 70 %
Weight: 3287.5 [oz_av]

## 2023-08-16 LAB — GLUCOSE, CAPILLARY
Glucose-Capillary: 272 mg/dL — ABNORMAL HIGH (ref 70–99)
Glucose-Capillary: 160 mg/dL — ABNORMAL HIGH (ref 70–99)
Glucose-Capillary: 204 mg/dL — ABNORMAL HIGH (ref 70–99)

## 2023-08-16 LAB — COMPREHENSIVE METABOLIC PANEL WITH GFR
ALT: 18 U/L (ref 0–44)
AST: 21 U/L (ref 15–41)
Albumin: 3 g/dL — ABNORMAL LOW (ref 3.5–5.0)
Alkaline Phosphatase: 80 U/L (ref 38–126)
Anion gap: 8 (ref 5–15)
BUN: 31 mg/dL — ABNORMAL HIGH (ref 8–23)
CO2: 23 mmol/L (ref 22–32)
Calcium: 8.8 mg/dL — ABNORMAL LOW (ref 8.9–10.3)
Chloride: 106 mmol/L (ref 98–111)
Creatinine, Ser: 2.03 mg/dL — ABNORMAL HIGH (ref 0.44–1.00)
GFR, Estimated: 23 mL/min — ABNORMAL LOW (ref >60)
Glucose, Bld: 137 mg/dL — ABNORMAL HIGH (ref 70–99)
Potassium: 4.3 mmol/L (ref 3.5–5.1)
Sodium: 137 mmol/L (ref 135–145)
Total Bilirubin: 0.6 mg/dL (ref 0.0–1.2)
Total Protein: 6.4 g/dL — ABNORMAL LOW (ref 6.5–8.1)

## 2023-08-16 LAB — LIPID PANEL
Cholesterol: 148 mg/dL (ref 0–200)
HDL: 51 mg/dL (ref >40)
LDL Cholesterol: 79 mg/dL (ref 0–99)
Total CHOL/HDL Ratio: 2.9 ratio
Triglycerides: 90 mg/dL (ref <150)
VLDL: 18 mg/dL (ref 0–40)

## 2023-08-16 MED ORDER — INSULIN ASPART 100 UNIT/ML IJ SOLN
0.0000 [IU] | Freq: Every day | INTRAMUSCULAR | Status: DC
  Administered 2023-08-16: 2 [IU] via SUBCUTANEOUS

## 2023-08-16 MED ORDER — INSULIN ASPART 100 UNIT/ML IJ SOLN
0.0000 [IU] | Freq: Three times a day (TID) | INTRAMUSCULAR | Status: DC
  Administered 2023-08-16 – 2023-08-17 (×4): 3 [IU] via SUBCUTANEOUS

## 2023-08-16 MED ORDER — PERFLUTREN LIPID MICROSPHERE
1.0000 mL | INTRAVENOUS | Status: AC | PRN
  Administered 2023-08-16: 2 mL via INTRAVENOUS

## 2023-08-16 MED ORDER — INSULIN GLARGINE-YFGN 100 UNIT/ML ~~LOC~~ SOLN
10.0000 [IU] | Freq: Every day | SUBCUTANEOUS | Status: DC
  Administered 2023-08-16 – 2023-08-17 (×2): 10 [IU] via SUBCUTANEOUS
  Filled 2023-08-16 (×2): qty 0.1

## 2023-08-16 MED ORDER — ACETAMINOPHEN 325 MG PO TABS
650.0000 mg | ORAL_TABLET | Freq: Four times a day (QID) | ORAL | Status: DC | PRN
  Administered 2023-08-16 (×2): 650 mg via ORAL
  Filled 2023-08-16 (×2): qty 2

## 2023-08-16 NOTE — Telephone Encounter (Signed)
 Spoke with patient. Was having lunch with a friend. Started having right sided weakness and slurred speech.  States that these symptoms have cleared.  Plan is for her to go to [facility] tomorrow and start rehab for approx. 2 weeks.  Will follow up with visit once in snf.

## 2023-08-16 NOTE — Progress Notes (Signed)
 Speech Language Pathology Treatment: Dysphagia;Cognitive-Linquistic  Patient Details Name: KARSON CHICAS MRN: [id number] DOB: 1934-07-24 Today's Date: 08/16/2023 Time: 0820-0850 SLP Time Calculation (min) (ACUTE ONLY): 30 min  Assessment / Plan / Recommendation Clinical Impression  Patient seen by SLP for skilled treatment focused on dysphagia and speech-language goals. Patient was awake alert and breakfast tray had arrived. After assistance from RN and SLP to slide patient up in bed, she was then able to feed herself, requiring only assistance for opening containers. Mastication was timely, no oral residuals as had been observed yesterday during evaluation and no overt s/s aspiration with thin liquids or solids. SLP recommends advance to regular texture solids, continue with thin liquids.  Patient did not exhibit any phonemic or semantic paraphasias and during informal conversation with SLP, she was fluent and with only occasional word finding difficulty. Overall, she was able to fully express herself without needing cues or assistance from SLP. She did repeat herself at times, but this occurred infrequently. Although patient appears to be at or near her baseline, SLP plans to continue to follow in light of her having three instances of acute onset of transient aphasia previous date.    HPI HPI: Patient is an 89 y.o. female with PMH: a-fib, breast cancer, CKD, HLD, dyspnea, bradycardia, HTN. She presented to the [facility] ED on 08/15/2023 as code stroke with facial droop, slurred speech. In ED, BP in 130's-150's systolic, CT head negative for intracranial abnormality, CTA head and neck showed LVO at left MCA branch and right ICA occlusion. MRI brain is pending.      SLP Plan  Continue with current plan of care          Recommendations  Diet recommendations: Regular;Thin liquid Liquids provided via: Cup;Straw Medication Administration: Whole meds with liquid Supervision: Patient able to self  feed Compensations: Slow rate;Small sips/bites Postural Changes and/or Swallow Maneuvers: Seated upright 90 degrees                  Oral care BID   None Dysphagia, unspecified (R13.10);Aphasia (R47.01)     Continue with current plan of care     Jacqualine Mater, MA, CCC-SLP Speech Therapy

## 2023-08-16 NOTE — Progress Notes (Signed)
*  PRELIMINARY RESULTS* Echocardiogram 2D Echocardiogram has been performed.  Summer Hawkins 08/16/2023, 12:39 PM

## 2023-08-16 NOTE — TOC Initial Note (Addendum)
 Transition of Care [facility]) - Initial/Assessment Note    Patient Details  Name: Summer Hawkins MRN: [id number] Date of Birth: 13-Feb-1935  Transition of Care [facility]) CM/SW Contact:    Summer Mends, LCSW Phone Number: 08/16/2023, 10:54 AM  Clinical Narrative:                 10:54am-CSW received consult for possible SNF placement at time of discharge. Patient is requesting [facility] as she has been there before. Since patient is in observation status and Medicare will not pay for SNF, CSW emailed [facility] to see if patient qualifies for the [facility] SNF waiver.  CSW spoke with [facility] who is a part of the [facility] program and they should have a bed for patient if stable tomorrow as long as [facility] approves.    11:02 AM-CSW received confirmation from [facility] stating patient is eligible for the program. [facility] updated.   CSW met with patient who confirmed her Granddaughter, Summer Hawkins, can help with admission paperwork though she lives out of state. Patient requested [facility] for transport. DNR placed on hard chart to be signed; MD aware.   CSW spoke with granddaughter, Summer Hawkins, and she reported agreement with assisting with paperwork.   Skilled Nursing Rehab Facilities-   [email or web address]   Ratings out of 5 stars (5 the highest)  Name Address  Phone # Quality Care Staffing Health Inspection Overall  [facility] [address] [phone] 3 1 4 3   [facility] [address], [place] [phone] 5 2 4 5   [facility] [address], [place] [phone] 2 1 1 1   [facility] [address] [place] [phone] 4 3 4 4   [facility]  [address], [place] [phone] 5 3 5 5   [facility] [address] [facility] [phone] 5 3 2 3   [facility] [address] [place] [phone] 5 1 2 2   [facility] [address], [place] [phone] 2 3 3 3   [address] ([facility]) [address] [place] [phone] 1 3 3 2   [facility] [address] [place], [place] [phone] 3 1 2 1   [facility] [address], [place] [phone] 3 1 1 1   [facility] [address] [place] [phone] 3 3 4 4   [facility] [address], [place] [phone] 4 4 3 3   [facility] [address], [place] [phone] [facility] [address] [place] [phone] 3 1 5 4   [facility] [address] [place] [phone] 4 1 1 1   [facility]  [address], [place] [phone] 2 3 2 2   [facility] [address] [phone] 2 2 4 4   [facility] [address] [place] [address], [place] [phone] 2 2 3 3              Expected Discharge Plan: Skilled Nursing Facility Barriers to Discharge: Continued Medical Work up, English as a second language teacher (Using [facility] waiver)   Patient Goals and CMS Choice Patient states their goals for this hospitalization and ongoing recovery are:: Rehab CMS Medicare.gov Compare Post Acute Care list provided to:: Patient Choice offered to / list presented to : Patient  ownership interest in [facility].provided to:: Patient    Expected Discharge Plan and Services In-house Referral: Clinical Social Work   Post Acute Care Choice:  Skilled Nursing Facility Living arrangements for the past 2 months: Single Family Home                                      Prior Living Arrangements/Services Living arrangements for the past 2 months: Single Family Home Lives with:: Self Patient language and need for interpreter reviewed:: Yes Do you feel safe going back to the place where you live?: Yes      Need for Family Participation in Patient Care: No (Comment) Care giver support system in place?: No (comment) Current home services: DME Criminal Activity/Legal Involvement Pertinent to  Current Situation/Hospitalization: No - Comment as needed  Activities of Daily Living      Permission Sought/Granted Permission sought to share information with : Facility Industrial/product designer granted to share information with : Yes, Verbal Permission Granted     Permission granted to share info w AGENCY: SNF        Emotional Assessment Appearance:: Appears stated age Attitude/Demeanor/Rapport: Engaged Affect (typically observed): Accepting Orientation: : Oriented to Self, Oriented to Place, Oriented to  Time, Oriented to Situation Alcohol  / Substance Use: Not Applicable Psych Involvement: No (comment)  Admission diagnosis:  Acute ischemic stroke [facility]) [I63.9] Acute CVA (cerebrovascular accident) [facility]) [I63.9] Patient Active Problem List   Diagnosis Date Noted   Acute CVA (cerebrovascular accident) (HCC) 08/15/2023   Vaccine counseling 12/31/2022   Hardware complicating wound infection (HCC) 08/28/2022   Proteus infection 08/28/2022   Pruritus 07/10/2022   Open wound of lumbar region 01/19/2022   Arthritis of hip 11/01/2021   History of lumbar fusion 11/01/2021   Hip pain 11/01/2021   Postoperative complication of skin involving drainage from surgical wound 12/20/2020   Sepsis (HCC) 11/26/2020   Back pain with history of spinal surgery 11/26/2020   Gram-negative bacteremia 11/26/2020   Sepsis due to urinary tract infection (HCC) 11/25/2020   Acute metabolic encephalopathy 11/25/2020   Type 2 diabetes mellitus without complication, with long-term current use of insulin  (HCC) 11/25/2020   Hypothyroidism 11/25/2020   CKD (chronic kidney disease), stage III (HCC) 11/25/2020   Spondylolisthesis, lumbar region 11/17/2020   Posterior capsular opacification, left 03/31/2020   Posterior vitreous detachment of both eyes 03/31/2020   Intermediate stage nonexudative age-related macular degeneration of both eyes 09/25/2019   Moderate nonproliferative diabetic  retinopathy of both eyes (HCC) 09/25/2019   Pacemaker 03/20/2019   Symptomatic bradycardia 11/30/2018   Syncope and collapse 11/21/2018   Carpal tunnel syndrome of right wrist 10/24/2018   Bradycardia 09/25/2017   Permanent atrial fibrillation (HCC)    Chronic diastolic CHF (congestive heart failure) (HCC) 08/08/2017   Essential hypertension 08/08/2017   Breast cancer of upper-outer quadrant of left female breast (HCC) 01/05/2016   Chronic atrial fibrillation (HCC) 01/05/2011   Morbid obesity (HCC) 12/29/2010   PCP:  Tisovec, Richard W, MD Pharmacy:   [facility]) [place], [place] - [address] [facility] [address] [place] [address] [place] [address] Phone: [phone] Fax: [phone]  [id number] [place], [place] - [address] AT [facility] OF [facility] & [address] [place] [address] Phone: [phone] Fax: [phone]  [facility] - [place], [place] - [address] [place] [address] Phone: [phone] Fax: [phone]     Social Drivers  of Health (SDOH) Social History: SDOH Screenings   Food Insecurity: No Food Insecurity (08/16/2023)  Housing: Low Risk  (08/16/2023)  Transportation Needs: No Transportation Needs (08/16/2023)  Utilities: Not At Risk (08/16/2023)  Depression (PHQ2-9): Low Risk  (07/10/2022)  Social Connections: Moderately Isolated (08/16/2023)  Tobacco Use: Low Risk  (08/15/2023)   SDOH Interventions:     Readmission Risk Interventions     No data to display

## 2023-08-16 NOTE — Care Management Obs Status (Signed)
 MEDICARE OBSERVATION STATUS NOTIFICATION   Patient Details  Name: Summer Hawkins MRN: [id number] Date of Birth: Oct 06, 1934   Medicare Observation Status Notification Given:  Yes    Felix Host 08/16/2023, 9:52 AM

## 2023-08-16 NOTE — Progress Notes (Signed)
 PROGRESS NOTE    Summer Hawkins  ZOX:[id number] DOB: 18-Jul-1934 DOA: 08/15/2023 PCP: Suzzanne Estrin, MD    Brief Narrative:   Summer Hawkins is a 89 y.o. female with past medical history significant for HTN, DM2, hypothyroidism, paroxysmal atrial fibrillation, symptomatic bradycardia s/p PPM, CKD stage IIIb, history of breast cancer, B12 deficiency, obesity who presented to [facility] ED on 08/15/2023 via EMS with facial droop, aphasia.  Last known normal at 0950, was with a friend at a restaurant eating breakfast.  And route via EMS, her aphasia was improving.  Patient denies fever, no chills, no chest pain, no shortness of breath, no abdominal pain, no constipation, no nausea/vomiting/diarrhea.  In the ED, temperature 98.7 F, HR 75, RR 18, BP 156/78, SpO2 97% on room air.  WBC 10.3, hemoglobin 13.5, platelet count 280.  Sodium 138, potassium 4.5, chloride 107, CO2 20, glucose 222, BUN 32, creatinine 1.96.  AST 20, ALT 14, total bilirubin 0.9.  EtOH level less than 15. CT head showed no acute abnormality. CTA head and neck showed LVO at left MCA branch and right ICA occlusion which appeared to be chronic.  MRI unable to be performed as patient's pacemaker not MRI compatible.  Neurology was consulted; TRH consulted for admission for further evaluation and management of acute CVA.  Assessment & Plan:   Acute CVA Patient presenting to the ED via EMS after acute onset of aphasia, facial droop while eating breakfast with a friend at a restaurant.  Last known normal was at 0950 on day of admission.  CT head without contrast with no acute process.  CTA head/neck positive for LVO of left MCA middle division, M3 versus very distal M2 occlusion, positive for completely occluded right ICA at distal bulb likely chronic.  MRI unable to obtain due to noncompatible pacemaker.  Neurology was consulted and discussed with neurointerventional radiology for consideration of left M2 intervention however patient's aphasia  nearly resolved therefore discussion with patient and family, risk seem to outweigh any benefits and was not performed.  Hemoglobin A1c 7.1, LDL 79. -- Neurology following, appreciate assistance -- TTE: Pending -- SLP: Cleared for regular diet -- OT recommending SNF placement -- PT evaluation: Pending -- Allow permissive hypertension up to 220/120 -- Monitor on telemetry  HTN At baseline on amlodipine  5 mg p.o. daily, irbesartan  300 mg p.o. daily, metoprolol  succinate 25 mg p.o. daily --Hold home hypertensives and allow permissive hypertension up to 220/120  DM2 Hemoglobin A1c 7.1, fairly well-controlled.  Home regimen includes Toujeo  12 units [place] qAm and 10 units [place] at bedtime, metformin  850 mg p.o. daily. -- Moderate SSI for coverage -- CBG before every meal/at bedtime  Paroxysmal atrial fibrillation -- Holding home antihypertensives to allow permissive hypertension as above (metoprolol ) -- Eliquis  2.5 g p.o. twice daily  Symptomatic bradycardia s/p PPM Medtronic placed 12/02/2018.  Outpatient follow-up with cardiology.  Hypothyroidism -- Levothyroxine  137 mcg p.o. daily  CKD stage IIIb Baseline creatinine 2.00.  Creatinine on admission 1.96, stable. -- Avoid nephrotoxins, renally dose all medications  Obesity, class II Body mass index is 35.27 kg/m.   DVT prophylaxis: apixaban  (ELIQUIS ) tablet 2.5 mg Start: 08/15/23 2200 apixaban  (ELIQUIS ) tablet 2.5 mg    Code Status: Limited: Do not attempt resuscitation (DNR) -DNR-LIMITED -Do Not Intubate/DNI  Family Communication: No family present at bedside this morning  Disposition Plan:  Level of care: ICU Status is: Observation The patient remains OBS appropriate and will d/c before 2 midnights.    Consultants:  Neurology  Procedures:  TTE: Pending  Antimicrobials:  None   Subjective: Patient seen examined bedside, lying in bed.  Eating breakfast.  No family present.  Patient's aphasia remains resolved.  No  complaints this morning.  Awaiting therapy evaluation, hoping that she will be able to go to rehab.  No other complaints or concerns at this time.  Denies headache, no dizziness, no visual changes/disturbance, no chest pain, no palpitations, no shortness of breath, no abdominal pain, no fever/chills/night sweats, no nausea/vomiting/diarrhea, no focal weakness, no fatigue, no cough/congestion, no paresthesia.  No acute events overnight per nursing staff.  Objective: Vitals:   08/16/23 0700 08/16/23 0800 08/16/23 0900 08/16/23 1000  BP: (!) 162/68 (!) 127/109 (!) 177/85 (!) 154/83  Pulse: 69 70 70 77  Resp: (!) 21 (!) 22 14 (!) 24  Temp:  97.9 F (36.6 C)    TempSrc:  Oral    SpO2: 98% 96% 96% 96%  Weight:      Height:        Intake/Output Summary (Last 24 hours) at 08/16/2023 1102 Last data filed at 08/16/2023 0900 Gross per 24 hour  Intake 480 ml  Output 850 ml  Net -370 ml   Filed Weights   08/15/23 1119  Weight: 93.2 kg    Examination:  Physical Exam: GEN: NAD, alert and oriented x 3, elderly in appearance,obese HEENT: NCAT, PERRL, EOMI, sclera clear, MMM PULM: CTAB w/o wheezes/crackles, normal respiratory effort, on room air CV: RRR w/o M/G/R GI: abd soft, NTND, NABS, no R/G/M MSK: no peripheral edema, muscle strength globally intact 5/5 bilateral upper/lower extremities PSYCH: normal mood/affect Integumentary: dry/intact, no rashes or wounds  Neuro Exam Mental Status: A&O x4, no dysarthria, no aphasia, concentration and attention were appropriate Cranial Nerves: visual fields full, PERRL, EOMi, intact smooth pursuit, no nystagmus, no ptosis, facial sensation intact bilaterally, 5/5 jaw strength, nasolabial fold & smile symetric,  eyebrow  raise & 5/5 eye closure symetric, hearing symmetric and normal to rubbing fingers, palate elevates symmetrically, head turning and shoulder shrug intact and symetric bilaterally, tongue protrusion is midline Motor: no pronator drift, LUE  5/5,   LLE 5/5,   RUE 5/5,   RLE 5/5   R. patellar 2+     R. achilles 2+        L. patellar 2+     L. achilles 2+  normal muscle bulk no significant atrophy, normal tone, no spasticity or rigidity apperciated  Sensory: Sensation is intact to light touch all 4 extremities except diminished anterior surface left lower extremity which is chronic and unchanged per patient  coordination/Movement: no tremor noted, no dysmetria, on finger-to-nose  Data Reviewed: I have personally reviewed following labs and imaging studies  CBC: Recent Labs  Lab 08/15/23 1039 08/15/23 1041 08/16/23 0527  WBC 10.3  --  10.4  NEUTROABS 6.9  --   --   HGB 13.5 14.3 12.4  HCT 42.0 42.0 39.0  MCV 96.3  --  97.3  PLT 280  --  251   Basic Metabolic Panel: Recent Labs  Lab 08/15/23 1039 08/15/23 1041 08/16/23 0527  NA 138 139 137  K 4.5 4.4 4.3  CL 107 109 106  CO2 20*  --  23  GLUCOSE 222* 223* 137*  BUN 32* 35* 31*  CREATININE 1.96* 2.10* 2.03*  CALCIUM  8.9  --  8.8*   GFR: Estimated Creatinine Clearance: 21.6 mL/min (A) (by C-G formula based on SCr of 2.03 mg/dL (H)). Liver Function Tests:  Recent Labs  Lab 08/15/23 1039 08/16/23 0527  AST 20 21  ALT 14 18  ALKPHOS 79 80  BILITOT 0.9 0.6  PROT 7.1 6.4*  ALBUMIN 3.4* 3.0*   No results for input(s): "LIPASE", "AMYLASE" in the last 168 hours. No results for input(s): "AMMONIA" in the last 168 hours. Coagulation Profile: Recent Labs  Lab 08/15/23 1039  INR 1.0   Cardiac Enzymes: No results for input(s): "CKTOTAL", "CKMB", "CKMBINDEX", "TROPONINI" in the last 168 hours. BNP (last 3 results) No results for input(s): "PROBNP" in the last 8760 hours. HbA1C: Recent Labs    08/15/23 1944  HGBA1C 7.1*   CBG: Recent Labs  Lab 08/15/23 1037  GLUCAP 209*   Lipid Profile: Recent Labs    08/16/23 0527  CHOL 148  HDL 51  LDLCALC 79  TRIG 90  CHOLHDL 2.9   Thyroid  Function Tests: No results for input(s): "TSH", "T4TOTAL",  "FREET4", "T3FREE", "THYROIDAB" in the last 72 hours. Anemia Panel: No results for input(s): "VITAMINB12", "FOLATE", "FERRITIN", "TIBC", "IRON", "RETICCTPCT" in the last 72 hours. Sepsis Labs: No results for input(s): "PROCALCITON", "LATICACIDVEN" in the last 168 hours.  Recent Results (from the past 240 hours)  MRSA Next Gen by PCR, Nasal     Status: None   Collection Time: 08/15/23  7:05 PM   Specimen: Nasal Mucosa; Nasal Swab  Result Value Ref Range Status   MRSA by PCR Next Gen NOT DETECTED NOT DETECTED Final    Comment: (NOTE) The GeneXpert MRSA Assay (FDA approved for NASAL specimens only), is one component of a comprehensive MRSA colonization surveillance program. It is not intended to diagnose MRSA infection nor to guide or monitor treatment for MRSA infections. Test performance is not FDA approved in patients less than 58 years old. Performed at [facility], [address], [place], [place] [address]          Radiology Studies: CT ANGIO HEAD NECK W WO CM W PERF (CODE STROKE) Result Date: 08/15/2023 CLINICAL DATA:  Neuro deficit, concern for stroke, severe aphasia. Concern for left M2/M3 occlusion. EXAM: CT ANGIOGRAPHY HEAD AND NECK CT PERFUSION BRAIN TECHNIQUE: Multidetector CT imaging of the head and neck was performed using the standard protocol during bolus administration of intravenous contrast. Multiplanar CT image reconstructions and MIPs were obtained to evaluate the vascular anatomy. Carotid stenosis measurements (when applicable) are obtained utilizing NASCET criteria, using the distal internal carotid diameter as the denominator. Multiphase CT imaging of the brain was performed following IV bolus contrast injection. Subsequent parametric perfusion maps were calculated using RAPID software. RADIATION DOSE REDUCTION: This exam was performed according to the departmental dose-optimization program which includes automated exposure control, adjustment of the mA and/or  kV according to patient size and/or use of iterative reconstruction technique. CONTRAST:  100mL OMNIPAQUE IOHEXOL 350 MG/ML SOLN COMPARISON:  Earlier same day CTA head and neck. FINDINGS: CTA NECK FINDINGS Aortic arch: Standard configuration of the aortic arch. Imaged portion shows no evidence of aneurysm or dissection. Mild atherosclerosis. No significant stenosis of the major arch vessel origins. Pulmonary arteries: As permitted by contrast timing, there are no filling defects in the visualized pulmonary arteries. Subclavian arteries: Patent bilaterally. Atherosclerosis involving the proximal aspect of both subclavian arteries without significant stenosis. Right carotid system: The common carotid artery is patent from the origin to the carotid bifurcation. External carotid artery branches are patent. There is abrupt occlusion of the proximal right cervical ICA. Mild atherosclerosis at the carotid bifurcation without high-grade stenosis.  Left carotid system: Patent. Mild atherosclerosis at the carotid bifurcation without hemodynamically significant stenosis. No evidence of dissection. Vertebral arteries: Codominant. No evidence of dissection, stenosis (50% or greater), or occlusion. Skeleton: No acute findings. Degenerative changes in the cervical spine. Other neck: The visualized airway is patent. No cervical lymphadenopathy. Upper chest: Visualized lung apices are clear. Left-sided pacer device. Review of the MIP images confirms the above findings CTA HEAD FINDINGS ANTERIOR CIRCULATION: The right internal carotid artery is occluded from the cervical segment to the ICA terminus. Reconstitution of the right MCA and right ACA via the circle-of-Willis. The left ICA is patent from the skull base to the ICA terminus. Mild atherosclerosis of the left carotid siphon without high-grade stenosis. MCAs: The left M1 segment is patent. Redemonstrated occlusion of a distal M2 branch of the middle division of the left MCA noted  on axial image 94. Left MCA branches otherwise patent. Right M1 segment is patent. Right MCA branches are patent. ACAs: The anterior cerebral arteries are patent bilaterally. POSTERIOR CIRCULATION: No significant stenosis, proximal occlusion, aneurysm, or vascular malformation. PCAs: The posterior cerebral arteries are patent bilaterally. Pcomm: The posterior communicating arteries are visualized bilaterally. SCAs: The superior cerebellar arteries are patent bilaterally. Basilar artery: Patent AICAs: Patent PICAs: Patent Vertebral arteries: The intracranial vertebral arteries are patent. Venous sinuses: As permitted by contrast timing, patent. Anatomic variants: None Review of the MIP images confirms the above findings CT Brain Perfusion Findings: ASPECTS: 10 CBF (<30%) Volume: 0mL Perfusion (Tmax>6.0s) volume: 10mL Mismatch Volume: 10mL Infarction Location:None identified. Region of elevated T-max within the left frontal lobe in the left MCA territory. IMPRESSION: Redemonstrated occlusion of a distal M2 middle division branch of the left MCA. Associated region of elevated T-max within the left frontal lobe concerning for hypoperfusion. No core infarct identified on CT perfusion. Redemonstrated occlusion of the right ICA from the proximal cervical segment to the ICA terminus, likely chronic. Reconstitution of the right MCA and ACA via the circle-of-Willis. Aortic Atherosclerosis (ICD10-I70.0). These results were called by telephone at the time of interpretation on 08/15/2023 at 5:25 pm to provider [facility] , who verbally acknowledged these results. Electronically Signed   By: Denny Flack M.D.   On: 08/15/2023 17:39   CT ANGIO HEAD NECK W WO CM (CODE STROKE) Result Date: 08/15/2023 CLINICAL DATA:  89 year old female.  Code stroke. EXAM: CT ANGIOGRAPHY HEAD AND NECK WITH AND WITHOUT CONTRAST TECHNIQUE: Multidetector CT imaging of the head and neck was performed using the standard protocol during bolus  administration of intravenous contrast. Multiplanar CT image reconstructions and MIPs were obtained to evaluate the vascular anatomy. Carotid stenosis measurements (when applicable) are obtained utilizing NASCET criteria, using the distal internal carotid diameter as the denominator. RADIATION DOSE REDUCTION: This exam was performed according to the departmental dose-optimization program which includes automated exposure control, adjustment of the mA and/or kV according to patient size and/or use of iterative reconstruction technique. CONTRAST:  75mL OMNIPAQUE IOHEXOL 350 MG/ML SOLN COMPARISON:  Plain head CT 1046 hours today. FINDINGS: CTA NECK Skeleton: No acute osseous abnormality identified. Absent maxillary dentition. Spine degeneration. Upper chest: Negative. Other neck: Nonvascular neck soft tissue spaces are within normal limits. Aortic arch: Calcified aortic atherosclerosis.  3 vessel arch. Right carotid system: Brachiocephalic artery and plaque without stenosis. Dense right subclavian venous contrast streak artifact affecting visualization of the proximal right CCA. However, before the level of the right carotid bifurcation there is relatively poor CCA enhancement compared to the left side (series  9, image 109). Faint enhancement through the right carotid bifurcation but the right ICA is definitely occluded by the level of the distal bulb (series 9, image 91). And there is no reconstitution to the skull base. Left carotid system: Tortuous proximal left CCA with mild calcified plaque at the origin. Mild for age calcified plaque at the left carotid bifurcation. Tortuosity in the neck below the skull base but no significant stenosis in the neck. Vertebral arteries: Calcified right subclavian artery origin without stenosis. Calcified plaque near the right vertebral artery origin without significant stenosis. Right vertebral is patent to the skull base with no additional plaque or stenosis. Proximal left  subclavian soft and calcified plaque without stenosis. Patent left vertebral artery origin. Tortuous left V1 segment. Fairly codominant left vertebral artery is patent to the skull base with no significant plaque or stenosis. CTA HEAD Posterior circulation: Distal ritual arteries, PICA origins, basilar artery, SCA and right PCA origins are patent without significant plaque or stenosis. There is a fetal type left PCA. A small right posterior communicating artery is present. Bilateral PCA branches are within normal limits. Anterior circulation: Right ICA siphon is occluded until reconstituted right MCA and ACA origins, right ICA terminus just distal to the right posterior communicating artery level. Slightly asymmetrically decreased right MCA and ACA compared to the left side. But relatively symmetric level of enhancement. No right MCA or ACA branch occlusion identified. Left ICA siphon is patent with calcified plaque most pronounced in the supraclinoid segment, mild supraclinoid stenosis suspected on series 11, image 100. Patent left ICA terminus. Normal left posterior communicating artery. Left ACA A1, anterior communicating artery, proximal ACA branches are within normal limits. Left MCA M1 is patent and bifurcates relatively early without stenosis (series 13, image 32. Dominant superior division at that bifurcation then bifurcates a 2nd time without stenosis on series 11, image 124. But distal to that middle MCA division branch is occluded at its origin on series 11, image 132. This occlusion is also visible on series 7, images 109 through 107. And there is decreased middle left MCA division vessel density when compared to the right side. Venous sinuses: Early contrast timing, not evaluated. Anatomic variants: Fetal type left PCA origin. Review of the MIP images confirms the above findings Preliminary report of the above discussed by telephone with Dr. Greg Leaks on 08/15/2023 at 11:03 . IMPRESSION: 1. Positive  for suspected ELVO: Left MCA middle division branch seen best on series 11, image 132. This is an M3 versus very distal M2 occlusion in light of somewhat early Left MCA bifurcation upstream of this. 2. Positive also for completely occluded Right ICA at the distal bulb, poor upstream Right CCA enhancement, no reconstitution and told the right ICA terminus. But upon discussion with Dr. Greg Leaks , this is most likely chronic. And there is relatively good collateral enhancement of the right ACA and MCA. 3. Other atherosclerosis in the head and neck without significant stenosis, including Left ICA supraclinoid calcified plaque with mild stenosis. 4.  Aortic Atherosclerosis (ICD10-I70.0). Preliminary report of the above discussed by telephone with Dr. Greg Leaks on 08/15/2023 at 11:03 . Electronically Signed   By: Marlise Simpers M.D.   On: 08/15/2023 11:11   CT HEAD CODE STROKE WO CONTRAST Result Date: 08/15/2023 CLINICAL DATA:  Code stroke. 89 year old female with isolated aphasia. EXAM: CT HEAD WITHOUT CONTRAST TECHNIQUE: Contiguous axial images were obtained from the base of the skull through the vertex without intravenous contrast. RADIATION DOSE REDUCTION:  This exam was performed according to the departmental dose-optimization program which includes automated exposure control, adjustment of the mA and/or kV according to patient size and/or use of iterative reconstruction technique. COMPARISON:  Head CT 11/25/2020. FINDINGS: Brain: Cerebral volume not significantly changed from 2022. No midline shift, ventriculomegaly, mass effect, evidence of mass lesion, intracranial hemorrhage or evidence of cortically based acute infarction. Patchy chronic cerebral white matter hypodensity appears stable since 2022. Stable gray-white matter differentiation throughout the brain. Faint chronic basal ganglia vascular calcifications. Vascular: Calcified atherosclerosis at the skull base. No suspicious intracranial vascular  hyperdensity. Skull: Stable and intact.  No acute osseous abnormality identified. Sinuses/Orbits: Visualized paranasal sinuses and mastoids are clear. Other: No acute orbit or scalp soft tissue finding. ASPECTS [facility] Stroke Program Early CT Score) Total score (0-10 with 10 being normal): 10 IMPRESSION: 1. No acute cortically based infarct or acute intracranial hemorrhage identified. ASPECTS 10. 2. Stable non contrast CT appearance of the brain since 2022. Mild for age white matter disease. 3. These results were communicated to Dr. Doretta Gant at 10:50 am on 08/15/2023 by text page via the [facility] messaging system. Electronically Signed   By: Marlise Simpers M.D.   On: 08/15/2023 10:51        Scheduled Meds:  apixaban   2.5 mg Oral BID   Chlorhexidine  Gluconate Cloth  6 each Topical Daily   levothyroxine   137 mcg Oral QAC breakfast   Continuous Infusions:  sodium chloride        LOS: 0 days    Time spent: 53 minutes spent on 08/16/2023 caring for this patient face-to-face including chart review, ordering labs/tests, documenting, discussion with nursing staff, consultants, updating family and interview/physical exam    Rema Care [place], DO [facility] Available via Epic secure chat 7am-7pm After these hours, please refer to coverage provider listed on [email or web address] 08/16/2023, 11:02 AM

## 2023-08-16 NOTE — TOC CAGE-AID Note (Signed)
 Transition of Care [facility]) - CAGE-AID Screening   Patient Details  Name: Summer Hawkins MRN: [id number] Date of Birth: [birth year]  [facility] CM/SW Contact:    Shalyn Koral E Phoenyx Melka, LCSW Phone Number: 08/16/2023, 10:02 AM   Clinical Narrative:    CAGE-AID Screening:    Have You Ever Felt You Ought to Cut Down on Your Drinking or Drug Use?: No Have People Annoyed You By [employer] Your Drinking Or Drug Use?: No Have You Felt Bad Or Guilty About Your Drinking Or Drug Use?: No Have You Ever Had a Drink or Used Drugs First Thing In The Morning to Steady Your Nerves or to Get Rid of a Hangover?: No CAGE-AID Score: 0  Substance Abuse Education Offered: No

## 2023-08-16 NOTE — Inpatient Diabetes Management (Signed)
 Inpatient Diabetes Program Recommendations  AACE/ADA: New Consensus Statement on Inpatient Glycemic Control (2015)  Target Ranges:  Prepandial:   less than 140 mg/dL      Peak postprandial:   less than 180 mg/dL (1-2 hours)      Critically ill patients:  140 - 180 mg/dL   Lab Results  Component Value Date   GLUCAP 272 (H) 08/16/2023   HGBA1C 7.1 (H) 08/15/2023    Review of Glycemic Control  Latest Reference Range & Units 08/15/23 10:37 08/16/23 11:53  Glucose-Capillary 70 - 99 mg/dL 366 (H) 440 (H)   Diabetes history: DM 2 Outpatient Diabetes medications:  Metformin  850 mg once daily, Toujeo  12 units in the AM and Toujeo  10 units q PM Current orders for Inpatient glycemic control:  Novolog  0-15 units tid with meals and HS Inpatient Diabetes Program Recommendations:    Please add Semglee  10 units daily while in the hospital.   Thanks,  Josefa Ni, RN, BC-ADM Inpatient Diabetes Coordinator Pager [phone]  (8a-5p)

## 2023-08-16 NOTE — [place] FL2 (Signed)
 [place]  MEDICAID FL2 LEVEL OF CARE FORM     IDENTIFICATION  Patient Name: Summer Hawkins Birthdate: 1934/05/13 Sex: female Admission Date (Current Location): 08/15/2023  [facility] and [place] Number:  Producer, television/film/video and Address:  [facility], [address] [place], [place] [address]      Provider Number: [id number]  Attending Physician Name and Address:  [place], Rema Care, DO  Relative Name and Phone Number:       Current Level of Care: Hospital Recommended Level of Care: Skilled Nursing Facility Prior Approval Number:    Date Approved/Denied:   PASRR Number: [id number]  Discharge Plan: SNF    Current Diagnoses: Patient Active Problem List   Diagnosis Date Noted   Acute CVA (cerebrovascular accident) (HCC) 08/15/2023   Vaccine counseling 12/31/2022   Hardware complicating wound infection (HCC) 08/28/2022   Proteus infection 08/28/2022   Pruritus 07/10/2022   Open wound of lumbar region 01/19/2022   Arthritis of hip 11/01/2021   History of lumbar fusion 11/01/2021   Hip pain 11/01/2021   Postoperative complication of skin involving drainage from surgical wound 12/20/2020   Sepsis (HCC) 11/26/2020   Back pain with history of spinal surgery 11/26/2020   Gram-negative bacteremia 11/26/2020   Sepsis due to urinary tract infection (HCC) 11/25/2020   Acute metabolic encephalopathy 11/25/2020   Type 2 diabetes mellitus without complication, with long-term current use of insulin  (HCC) 11/25/2020   Hypothyroidism 11/25/2020   CKD (chronic kidney disease), stage III (HCC) 11/25/2020   Spondylolisthesis, lumbar region 11/17/2020   Posterior capsular opacification, left 03/31/2020   Posterior vitreous detachment of both eyes 03/31/2020   Intermediate stage nonexudative age-related macular degeneration of both eyes 09/25/2019   Moderate nonproliferative diabetic retinopathy of both eyes (HCC) 09/25/2019   Pacemaker 03/20/2019   Symptomatic  bradycardia 11/30/2018   Syncope and collapse 11/21/2018   Carpal tunnel syndrome of right wrist 10/24/2018   Bradycardia 09/25/2017   Permanent atrial fibrillation (HCC)    Chronic diastolic CHF (congestive heart failure) (HCC) 08/08/2017   Essential hypertension 08/08/2017   Breast cancer of upper-outer quadrant of left female breast (HCC) 01/05/2016   Chronic atrial fibrillation (HCC) 01/05/2011   Morbid obesity (HCC) 12/29/2010    Orientation RESPIRATION BLADDER Height & Weight     Self, Time, Situation, Place  Normal Continent, External catheter Weight: 205 lb 7.5 oz (93.2 kg) Height:  5\' 4"  (162.6 cm)  BEHAVIORAL SYMPTOMS/MOOD NEUROLOGICAL BOWEL NUTRITION STATUS      Continent Diet (See dc summary)  AMBULATORY STATUS COMMUNICATION OF NEEDS Skin   Limited Assist Verbally Normal                       Personal Care Assistance Level of Assistance  Bathing, Dressing, Feeding Bathing Assistance: Limited assistance Feeding assistance: Independent Dressing Assistance: Limited assistance     Functional Limitations Info  Sight Sight Info: Impaired (Glasses)        SPECIAL CARE FACTORS FREQUENCY  PT (By licensed PT), OT (By licensed OT)     PT Frequency: 5x/week OT Frequency: 5x/week            Contractures Contractures Info: Not present    Additional Factors Info  Code Status, Allergies Code Status Info: DNR Allergies Info: Bee Venom, Penicillins, Lisinopril, Zocor (Simvastatin), Liraglutide           Current Medications (08/16/2023):  This is the current hospital active medication list Current Facility-Administered Medications  Medication Dose Route Frequency Provider Last Rate Last Admin   0.9 %  sodium chloride  infusion   Intravenous Continuous Jonette Nestle A, NP       acetaminophen  (TYLENOL ) tablet 650 mg  650 mg Oral Q6H PRN Khaliqdina, Salman, MD   650 mg at 08/16/23 0150   apixaban  (ELIQUIS ) tablet 2.5 mg  2.5 mg Oral BID Melvin, Alexander B, MD    2.5 mg at 08/16/23 6063   Chlorhexidine  Gluconate Cloth 2 % PADS 6 each  6 each Topical Daily Melvin, Alexander B, MD   6 each at 08/16/23 0160   levothyroxine  (SYNTHROID ) tablet 137 mcg  137 mcg Oral QAC breakfast Johnetta Nab, MD   137 mcg at 08/16/23 0755   senna-docusate (Senokot-S) tablet 1 tablet  1 tablet Oral QHS PRN Melvin, Alexander B, MD         Discharge Medications: Please see discharge summary for a list of discharge medications.  Relevant Imaging Results:  Relevant Lab Results:   Additional Information SSN #[id number].  Sheilah Rayos S Annalisa Colonna, LCSW

## 2023-08-16 NOTE — Evaluation (Signed)
 Occupational Therapy Evaluation Patient Details Name: Summer Hawkins MRN: [id number] DOB: Apr 10, 1934 Today's Date: 08/16/2023   History of Present Illness   89 y.o. female adm 08/15/23 with medical history significant of hypertension, diabetes, hypothyroidism, atrial fibrillation, bradycardia, CKD 3, diastolic CHF, breast cancer, obesity presented with sudden onset facial droop and difficulty speaking, symptoms resolved 6/4 with neurology exam.  CT Angio:  Positive for suspected ELVO: Left MCA middle division branch and Positive for completely occluded Right ICA.     Clinical Impressions Patient admitted for the diagnosis above.  PTA she lives alone, has PRN assist from friends, primarily for community mobility.  Presents with deficits listed below.  Currently needing up to Min A for ADL completion and in room mobility/toileting.  OT will continue efforts in the acute setting to address deficits, and Patient will benefit from continued inpatient follow up therapy, <3 hours/day, to maximize functional status prior to returning home alone.       If plan is discharge home, recommend the following:   Assist for transportation;A little help with bathing/dressing/bathroom;A little help with walking and/or transfers;Help with stairs or ramp for entrance;Assistance with cooking/housework     Functional Status Assessment   Patient has had a recent decline in their functional status and demonstrates the ability to make significant improvements in function in a reasonable and predictable amount of time.     Equipment Recommendations   None recommended by OT     Recommendations for Other Services         Precautions/Restrictions   Precautions Precautions: Fall Restrictions Weight Bearing Restrictions Per Provider Order: No     Mobility Bed Mobility Overal bed mobility: Needs Assistance Bed Mobility: Supine to Sit     Supine to sit: Min assist          Transfers Overall  transfer level: Needs assistance Equipment used: Rolling walker (2 wheels) Transfers: Sit to/from Stand, Bed to chair/wheelchair/BSC Sit to Stand: Supervision     Step pivot transfers: Contact guard assist, Min assist     General transfer comment: Difficulty managing 2WRW      Balance Overall balance assessment: Needs assistance Sitting-balance support: Feet supported Sitting balance-Leahy Scale: Good     Standing balance support: Reliant on assistive device for balance Standing balance-Leahy Scale: Poor Standing balance comment: patient having to lean on counter for grooming task                           ADL either performed or assessed with clinical judgement   ADL Overall ADL's : Needs assistance/impaired Eating/Feeding: Independent;Sitting   Grooming: Wash/dry hands;Oral care;Contact guard assist;Standing   Upper Body Bathing: Set up;Sitting   Lower Body Bathing: Contact guard assist;Sit to/from stand   Upper Body Dressing : Set up;Sitting   Lower Body Dressing: Contact guard assist;Sit to/from stand   Toilet Transfer: Contact guard assist;Minimal assistance;Rolling walker (2 wheels)                   Vision Baseline Vision/History: 6 Macular Degeneration Patient Visual Report: No change from baseline       Perception Perception: Not tested       Praxis Praxis: Not tested       Pertinent Vitals/Pain Pain Assessment Pain Assessment: No/denies pain     Extremity/Trunk Assessment Upper Extremity Assessment Upper Extremity Assessment: Overall WFL for tasks assessed   Lower Extremity Assessment Lower Extremity Assessment: Defer to PT evaluation  Cervical / Trunk Assessment Cervical / Trunk Assessment: Kyphotic   Communication Communication Communication: No apparent difficulties   Cognition Arousal: Alert Behavior During Therapy: WFL for tasks assessed/performed Cognition: No apparent impairments                                Following commands: Intact       Cueing  General Comments   Cueing Techniques: Verbal cues   VSS on RA   Exercises     Shoulder Instructions      Home Living Family/patient expects to be discharged to:: Private residence Living Arrangements: Alone Available Help at Discharge: Friend(s);Available PRN/intermittently Type of Home: House Home Access: Ramped entrance     Home Layout: One level     Bathroom Shower/Tub: Producer, television/film/video: Handicapped height Bathroom Accessibility: Yes How Accessible: Accessible via walker Home Equipment: Rollator (4 wheels);BSC/3in1;Shower seat;Grab bars - tub/shower;Hand held shower head;Wheelchair - manual      Lives With: Alone    Prior Functioning/Environment Prior Level of Function : Independent/Modified Independent               ADLs Comments: Mod I for ADL and light iADL.  Cares for her own meds, does not drive.    OT Problem List: Impaired balance (sitting and/or standing)   OT Treatment/Interventions: Self-care/ADL training;Therapeutic activities;DME and/or AE instruction;Patient/family education;Balance training      OT Goals(Current goals can be found in the care plan section)   Acute Rehab OT Goals Patient Stated Goal: Eventually return home OT Goal Formulation: With patient Time For Goal Achievement: 08/30/23 Potential to Achieve Goals: Good ADL Goals Pt Will Perform Grooming: with modified independence;standing Pt Will Perform Lower Body Dressing: with modified independence;sit to/from stand Pt Will Transfer to Toilet: with modified independence;ambulating;regular height toilet   OT Frequency:  Min 2X/week    Co-evaluation              AM-PAC OT "6 Clicks" Daily Activity     Outcome Measure Help from another person eating meals?: None Help from another person taking care of personal grooming?: A Little Help from another person toileting, which includes using toliet,  bedpan, or urinal?: A Little Help from another person bathing (including washing, rinsing, drying)?: A Little Help from another person to put on and taking off regular upper body clothing?: None Help from another person to put on and taking off regular lower body clothing?: A Little 6 Click Score: 20   End of Session Equipment Utilized During Treatment: Rolling walker (2 wheels) Nurse Communication: Mobility status  Activity Tolerance: Patient tolerated treatment well Patient left: in chair;with call bell/phone within reach;with chair alarm set  OT Visit Diagnosis: Unsteadiness on feet (R26.81)                Time: 6045-4098 OT Time Calculation (min): 26 min Charges:  OT General Charges $OT Visit: 1 Visit OT Evaluation $OT Eval Moderate Complexity: 1 Mod OT Treatments $Self Care/Home Management : 8-22 mins  08/16/2023  RP, OTR/L  Acute Rehabilitation Services  Office:  [phone]   Benjamen Brand 08/16/2023, 9:49 AM

## 2023-08-16 NOTE — Evaluation (Signed)
 Physical Therapy Evaluation Patient Details Name: Summer Hawkins MRN: [id number] DOB: 08-Oct-1934 Today's Date: 08/16/2023  History of Present Illness  89 y.o. female adm 08/15/23 with sudden onset facial droop and difficulty speaking, symptoms resolved 6/4 with neurology exam.  CT Angio:  Positive for suspected ELVO: Left MCA middle division branch and Positive for completely occluded Right ICA. PMH: hypertension, diabetes, hypothyroidism, atrial fibrillation, bradycardia, CKD 3, diastolic CHF, breast cancer, obesity   Clinical Impression  Pt presents with condition above and deficits mentioned below, see PT Problem List. PTA, she was mod I utilizing a rollator for functional mobility, living alone in a 1-level house with a ramped entrance. The pt has chronic L leg weakness and sensory deficits from a prior back surgery. Currently, the pt appears to be and reports she is functioning near her baseline, but ambulating slower than usual. She is currently requiring CGA for safety with all functional mobility while utilizing a rollator to ambulate. She is currently reporting concern about discharging home alone. However, she is adamantly against wanting to go to an ILF/ALF long-term, but reports desire for short-term inpatient rehab, <3 hours/day, to improve her independence and safety with all functional mobility, which she could benefit from as she currently is at risk for falls. If she could acquire some HH services, like an aide, she may be able to d/c home with HHPT. She agrees. Notified TOC. Will continue to follow acutely.       If plan is discharge home, recommend the following: A little help with walking and/or transfers;A little help with bathing/dressing/bathroom;Assistance with cooking/housework;Assist for transportation   Can travel by private vehicle   Yes    Equipment Recommendations None recommended by PT  Recommendations for Other Services       Functional Status Assessment Patient  has had a recent decline in their functional status and demonstrates the ability to make significant improvements in function in a reasonable and predictable amount of time.     Precautions / Restrictions Precautions Precautions: Fall Restrictions Weight Bearing Restrictions Per Provider Order: No      Mobility  Bed Mobility               General bed mobility comments: Pt up in chair upon arrival and at end of session    Transfers Overall transfer level: Needs assistance Equipment used: Rollator (4 wheels) Transfers: Sit to/from Stand Sit to Stand: Contact guard assist           General transfer comment: Pt needed extra time and rocking of her trunk to gain momentum to transfer to stand each rep with CGA for safety    Ambulation/Gait Ambulation/Gait assistance: Contact guard assist Gait Distance (Feet): 80 Feet (x2 bouts of ~80 ft > ~15 ft) Assistive device: Rollator (4 wheels) Gait Pattern/deviations: Step-through pattern, Decreased stride length, Decreased step length - left, Decreased weight shift to left, Trunk flexed Gait velocity: reduced Gait velocity interpretation: <1.8 ft/sec, indicate of risk for recurrent falls   General Gait Details: Pt ambulates with decreased L step length and clearance along with a flexed posture. No LOB, even when cued to change speeds and head directions, but does slow speed with head position changes, CGA for safety  Stairs            Wheelchair Mobility     Tilt Bed    Modified Rankin (Stroke Patients Only) Modified Rankin (Stroke Patients Only) Pre-Morbid Rankin Score: Moderate disability Modified Rankin: Moderate disability  Balance Overall balance assessment: Needs assistance Sitting-balance support: No upper extremity supported, Feet supported Sitting balance-Leahy Scale: Good     Standing balance support: Bilateral upper extremity supported, During functional activity, Reliant on assistive device for  balance Standing balance-Leahy Scale: Poor Standing balance comment: reliant on rollator                             Pertinent Vitals/Pain Pain Assessment Pain Assessment: Faces Faces Pain Scale: No hurt Pain Intervention(s): Monitored during session    Home Living Family/patient expects to be discharged to:: Private residence Living Arrangements: Alone Available Help at Discharge: Friend(s);Available PRN/intermittently Type of Home: House Home Access: Ramped entrance       Home Layout: One level Home Equipment: Rollator (4 wheels);BSC/3in1;Shower seat;Grab bars - tub/shower;Hand held shower head;Wheelchair - manual      Prior Function Prior Level of Function : Independent/Modified Independent             Mobility Comments: uses rollator mod I ADLs Comments: Mod I for ADL and light iADL.  Cares for her own meds, does not drive.     Extremity/Trunk Assessment   Upper Extremity Assessment Upper Extremity Assessment: Defer to OT evaluation    Lower Extremity Assessment Lower Extremity Assessment: LLE deficits/detail LLE Deficits / Details: Hx of chronic L leg weakness from prior back surgery; MMT scores of 2+ hip flexion, 3+ knee extension, 4 dorsiflexion; reports numbness at anterior shin and superior thigh/hip LLE Sensation: decreased light touch    Cervical / Trunk Assessment Cervical / Trunk Assessment: Kyphotic  Communication   Communication Communication: No apparent difficulties    Cognition Arousal: Alert Behavior During Therapy: WFL for tasks assessed/performed   PT - Cognitive impairments: No apparent impairments                         Following commands: Intact       Cueing Cueing Techniques: Verbal cues     General Comments General comments (skin integrity, edema, etc.): VSS on RA    Exercises     Assessment/Plan    PT Assessment Patient needs continued PT services  PT Problem List Decreased strength;Decreased  balance;Decreased activity tolerance;Decreased mobility;Impaired sensation       PT Treatment Interventions DME instruction;Gait training;Functional mobility training;Therapeutic exercise;Therapeutic activities;Balance training;Neuromuscular re-education;Patient/family education    PT Goals (Current goals can be found in the Care Plan section)  Acute Rehab PT Goals Patient Stated Goal: to go to SNF PT Goal Formulation: With patient Time For Goal Achievement: 08/30/23 Potential to Achieve Goals: Good    Frequency Min 1X/week     Co-evaluation               AM-PAC PT "6 Clicks" Mobility  Outcome Measure Help needed turning from your back to your side while in a flat bed without using bedrails?: A Little Help needed moving from lying on your back to sitting on the side of a flat bed without using bedrails?: A Little Help needed moving to and from a bed to a chair (including a wheelchair)?: A Little Help needed standing up from a chair using your arms (e.g., wheelchair or bedside chair)?: A Little Help needed to walk in hospital room?: A Little Help needed climbing 3-5 steps with a railing? : Total 6 Click Score: 16    End of Session Equipment Utilized During Treatment: Gait belt Activity Tolerance: Patient tolerated treatment  well Patient left: with call bell/phone within reach;Other (comment) (on commode; educated to call nursing when done, she verbalized understanding, RN made aware of pt location) Nurse Communication: Mobility status;Other (comment) (pt on commode) PT Visit Diagnosis: Unsteadiness on feet (R26.81);Other abnormalities of gait and mobility (R26.89);Muscle weakness (generalized) (M62.81);Difficulty in walking, not elsewhere classified (R26.2)    Time: 4098-1191 PT Time Calculation (min) (ACUTE ONLY): 34 min   Charges:   PT Evaluation $PT Eval Moderate Complexity: 1 Mod PT Treatments $Gait Training: 8-22 mins PT General Charges $$ ACUTE PT VISIT: 1  Visit         Vernida Goodie, PT, DPT Acute Rehabilitation Services  Office: [phone]   Ellyn Hack 08/16/2023, 2:29 PM

## 2023-08-16 NOTE — Progress Notes (Addendum)
 STROKE TEAM PROGRESS NOTE   INTERIM HISTORY/SUBJECTIVE Patient presented with fluctuating symptoms of expressive aphasia and facial droop with CT angiogram showing very distal M2/M3 occlusion and was admitted to the ICU for observation.  She has remained stable overnight without any recurrence of the symptoms.  She does have A-fib and has been compliant with taking her Eliquis .   Feeling well this morning. No more episodes of aphasia CT Head since she is unable to have MRI at this time due to [facility]  OBJECTIVE  CBC    Component Value Date/Time   WBC 10.4 08/16/2023 0527   RBC 4.01 08/16/2023 0527   HGB 12.4 08/16/2023 0527   HGB 13.9 01/06/2016 1533   HCT 39.0 08/16/2023 0527   HCT 41.9 01/06/2016 1533   PLT 251 08/16/2023 0527   PLT 246 01/06/2016 1533   MCV 97.3 08/16/2023 0527   MCV 93.1 01/06/2016 1533   MCH 30.9 08/16/2023 0527   MCHC 31.8 08/16/2023 0527   RDW 13.5 08/16/2023 0527   RDW 12.7 01/06/2016 1533   LYMPHSABS 1.9 08/15/2023 1039   LYMPHSABS 3.1 01/06/2016 1533   MONOABS 1.0 08/15/2023 1039   MONOABS 0.9 01/06/2016 1533   EOSABS 0.4 08/15/2023 1039   EOSABS 0.7 (H) 01/06/2016 1533   BASOSABS 0.1 08/15/2023 1039   BASOSABS 0.1 01/06/2016 1533    BMET    Component Value Date/Time   NA 137 08/16/2023 0527   NA 140 05/15/2018 1106   NA 138 01/06/2016 1533   K 4.3 08/16/2023 0527   K 4.2 01/06/2016 1533   CL 106 08/16/2023 0527   CO2 23 08/16/2023 0527   CO2 24 01/06/2016 1533   GLUCOSE 137 (H) 08/16/2023 0527   GLUCOSE 234 (H) 01/06/2016 1533   BUN 31 (H) 08/16/2023 0527   BUN 26 05/15/2018 1106   BUN 16.8 01/06/2016 1533   CREATININE 2.03 (H) 08/16/2023 0527   CREATININE 2.06 (H) 06/07/2022 0338   CREATININE 0.9 01/06/2016 1533   CALCIUM  8.8 (L) 08/16/2023 0527   CALCIUM  9.1 01/06/2016 1533   EGFR 23 (L) 06/07/2022 0338   GFRNONAA 23 (L) 08/16/2023 0527    IMAGING past 24 hours CT ANGIO HEAD NECK W WO CM W PERF (CODE STROKE) Result Date:  08/15/2023 CLINICAL DATA:  Neuro deficit, concern for stroke, severe aphasia. Concern for left M2/M3 occlusion. EXAM: CT ANGIOGRAPHY HEAD AND NECK CT PERFUSION BRAIN TECHNIQUE: Multidetector CT imaging of the head and neck was performed using the standard protocol during bolus administration of intravenous contrast. Multiplanar CT image reconstructions and MIPs were obtained to evaluate the vascular anatomy. Carotid stenosis measurements (when applicable) are obtained utilizing NASCET criteria, using the distal internal carotid diameter as the denominator. Multiphase CT imaging of the brain was performed following IV bolus contrast injection. Subsequent parametric perfusion maps were calculated using RAPID software. RADIATION DOSE REDUCTION: This exam was performed according to the departmental dose-optimization program which includes automated exposure control, adjustment of the mA and/or kV according to patient size and/or use of iterative reconstruction technique. CONTRAST:  100mL OMNIPAQUE IOHEXOL 350 MG/ML SOLN COMPARISON:  Earlier same day CTA head and neck. FINDINGS: CTA NECK FINDINGS Aortic arch: Standard configuration of the aortic arch. Imaged portion shows no evidence of aneurysm or dissection. Mild atherosclerosis. No significant stenosis of the major arch vessel origins. Pulmonary arteries: As permitted by contrast timing, there are no filling defects in the visualized pulmonary arteries. Subclavian arteries: Patent bilaterally. Atherosclerosis involving the proximal aspect of both subclavian  arteries without significant stenosis. Right carotid system: The common carotid artery is patent from the origin to the carotid bifurcation. External carotid artery branches are patent. There is abrupt occlusion of the proximal right cervical ICA. Mild atherosclerosis at the carotid bifurcation without high-grade stenosis. Left carotid system: Patent. Mild atherosclerosis at the carotid bifurcation without  hemodynamically significant stenosis. No evidence of dissection. Vertebral arteries: Codominant. No evidence of dissection, stenosis (50% or greater), or occlusion. Skeleton: No acute findings. Degenerative changes in the cervical spine. Other neck: The visualized airway is patent. No cervical lymphadenopathy. Upper chest: Visualized lung apices are clear. Left-sided pacer device. Review of the MIP images confirms the above findings CTA HEAD FINDINGS ANTERIOR CIRCULATION: The right internal carotid artery is occluded from the cervical segment to the ICA terminus. Reconstitution of the right MCA and right ACA via the circle-of-Willis. The left ICA is patent from the skull base to the ICA terminus. Mild atherosclerosis of the left carotid siphon without high-grade stenosis. MCAs: The left M1 segment is patent. Redemonstrated occlusion of a distal M2 branch of the middle division of the left MCA noted on axial image 94. Left MCA branches otherwise patent. Right M1 segment is patent. Right MCA branches are patent. ACAs: The anterior cerebral arteries are patent bilaterally. POSTERIOR CIRCULATION: No significant stenosis, proximal occlusion, aneurysm, or vascular malformation. PCAs: The posterior cerebral arteries are patent bilaterally. Pcomm: The posterior communicating arteries are visualized bilaterally. SCAs: The superior cerebellar arteries are patent bilaterally. Basilar artery: Patent AICAs: Patent PICAs: Patent Vertebral arteries: The intracranial vertebral arteries are patent. Venous sinuses: As permitted by contrast timing, patent. Anatomic variants: None Review of the MIP images confirms the above findings CT Brain Perfusion Findings: ASPECTS: 10 CBF (<30%) Volume: 0mL Perfusion (Tmax>6.0s) volume: 10mL Mismatch Volume: 10mL Infarction Location:None identified. Region of elevated T-max within the left frontal lobe in the left MCA territory. IMPRESSION: Redemonstrated occlusion of a distal M2 middle division  branch of the left MCA. Associated region of elevated T-max within the left frontal lobe concerning for hypoperfusion. No core infarct identified on CT perfusion. Redemonstrated occlusion of the right ICA from the proximal cervical segment to the ICA terminus, likely chronic. Reconstitution of the right MCA and ACA via the circle-of-Willis. Aortic Atherosclerosis (ICD10-I70.0). These results were called by telephone at the time of interpretation on 08/15/2023 at 5:25 pm to provider [facility] , who verbally acknowledged these results. Electronically Signed   By: Denny Flack M.D.   On: 08/15/2023 17:39   CT ANGIO HEAD NECK W WO CM (CODE STROKE) Result Date: 08/15/2023 CLINICAL DATA:  89 year old female.  Code stroke. EXAM: CT ANGIOGRAPHY HEAD AND NECK WITH AND WITHOUT CONTRAST TECHNIQUE: Multidetector CT imaging of the head and neck was performed using the standard protocol during bolus administration of intravenous contrast. Multiplanar CT image reconstructions and MIPs were obtained to evaluate the vascular anatomy. Carotid stenosis measurements (when applicable) are obtained utilizing NASCET criteria, using the distal internal carotid diameter as the denominator. RADIATION DOSE REDUCTION: This exam was performed according to the departmental dose-optimization program which includes automated exposure control, adjustment of the mA and/or kV according to patient size and/or use of iterative reconstruction technique. CONTRAST:  75mL OMNIPAQUE IOHEXOL 350 MG/ML SOLN COMPARISON:  Plain head CT 1046 hours today. FINDINGS: CTA NECK Skeleton: No acute osseous abnormality identified. Absent maxillary dentition. Spine degeneration. Upper chest: Negative. Other neck: Nonvascular neck soft tissue spaces are within normal limits. Aortic arch: Calcified aortic atherosclerosis.  3 vessel  arch. Right carotid system: Brachiocephalic artery and plaque without stenosis. Dense right subclavian venous contrast streak artifact  affecting visualization of the proximal right CCA. However, before the level of the right carotid bifurcation there is relatively poor CCA enhancement compared to the left side (series 9, image 109). Faint enhancement through the right carotid bifurcation but the right ICA is definitely occluded by the level of the distal bulb (series 9, image 91). And there is no reconstitution to the skull base. Left carotid system: Tortuous proximal left CCA with mild calcified plaque at the origin. Mild for age calcified plaque at the left carotid bifurcation. Tortuosity in the neck below the skull base but no significant stenosis in the neck. Vertebral arteries: Calcified right subclavian artery origin without stenosis. Calcified plaque near the right vertebral artery origin without significant stenosis. Right vertebral is patent to the skull base with no additional plaque or stenosis. Proximal left subclavian soft and calcified plaque without stenosis. Patent left vertebral artery origin. Tortuous left V1 segment. Fairly codominant left vertebral artery is patent to the skull base with no significant plaque or stenosis. CTA HEAD Posterior circulation: Distal ritual arteries, PICA origins, basilar artery, SCA and right PCA origins are patent without significant plaque or stenosis. There is a fetal type left PCA. A small right posterior communicating artery is present. Bilateral PCA branches are within normal limits. Anterior circulation: Right ICA siphon is occluded until reconstituted right MCA and ACA origins, right ICA terminus just distal to the right posterior communicating artery level. Slightly asymmetrically decreased right MCA and ACA compared to the left side. But relatively symmetric level of enhancement. No right MCA or ACA branch occlusion identified. Left ICA siphon is patent with calcified plaque most pronounced in the supraclinoid segment, mild supraclinoid stenosis suspected on series 11, image 100. Patent left  ICA terminus. Normal left posterior communicating artery. Left ACA A1, anterior communicating artery, proximal ACA branches are within normal limits. Left MCA M1 is patent and bifurcates relatively early without stenosis (series 13, image 32. Dominant superior division at that bifurcation then bifurcates a 2nd time without stenosis on series 11, image 124. But distal to that middle MCA division branch is occluded at its origin on series 11, image 132. This occlusion is also visible on series 7, images 109 through 107. And there is decreased middle left MCA division vessel density when compared to the right side. Venous sinuses: Early contrast timing, not evaluated. Anatomic variants: Fetal type left PCA origin. Review of the MIP images confirms the above findings Preliminary report of the above discussed by telephone with Dr. Greg Leaks on 08/15/2023 at 11:03 . IMPRESSION: 1. Positive for suspected ELVO: Left MCA middle division branch seen best on series 11, image 132. This is an M3 versus very distal M2 occlusion in light of somewhat early Left MCA bifurcation upstream of this. 2. Positive also for completely occluded Right ICA at the distal bulb, poor upstream Right CCA enhancement, no reconstitution and told the right ICA terminus. But upon discussion with Dr. Greg Leaks , this is most likely chronic. And there is relatively good collateral enhancement of the right ACA and MCA. 3. Other atherosclerosis in the head and neck without significant stenosis, including Left ICA supraclinoid calcified plaque with mild stenosis. 4.  Aortic Atherosclerosis (ICD10-I70.0). Preliminary report of the above discussed by telephone with Dr. Greg Leaks on 08/15/2023 at 11:03 . Electronically Signed   By: Marlise Simpers M.D.   On: 08/15/2023 11:11  CT HEAD CODE STROKE WO CONTRAST Result Date: 08/15/2023 CLINICAL DATA:  Code stroke. 89 year old female with isolated aphasia. EXAM: CT HEAD WITHOUT CONTRAST TECHNIQUE: Contiguous  axial images were obtained from the base of the skull through the vertex without intravenous contrast. RADIATION DOSE REDUCTION: This exam was performed according to the departmental dose-optimization program which includes automated exposure control, adjustment of the mA and/or kV according to patient size and/or use of iterative reconstruction technique. COMPARISON:  Head CT 11/25/2020. FINDINGS: Brain: Cerebral volume not significantly changed from 2022. No midline shift, ventriculomegaly, mass effect, evidence of mass lesion, intracranial hemorrhage or evidence of cortically based acute infarction. Patchy chronic cerebral white matter hypodensity appears stable since 2022. Stable gray-white matter differentiation throughout the brain. Faint chronic basal ganglia vascular calcifications. Vascular: Calcified atherosclerosis at the skull base. No suspicious intracranial vascular hyperdensity. Skull: Stable and intact.  No acute osseous abnormality identified. Sinuses/Orbits: Visualized paranasal sinuses and mastoids are clear. Other: No acute orbit or scalp soft tissue finding. ASPECTS [facility] Stroke Program Early CT Score) Total score (0-10 with 10 being normal): 10 IMPRESSION: 1. No acute cortically based infarct or acute intracranial hemorrhage identified. ASPECTS 10. 2. Stable non contrast CT appearance of the brain since 2022. Mild for age white matter disease. 3. These results were communicated to Dr. Doretta Gant at 10:50 am on 08/15/2023 by text page via the [facility] messaging system. Electronically Signed   By: Marlise Simpers M.D.   On: 08/15/2023 10:51    Vitals:   08/16/23 0500 08/16/23 0600 08/16/23 0700 08/16/23 0800  BP: (!) 140/65 (!) 123/91 (!) 162/68 (!) 127/109  Pulse: 69 69 69 70  Resp: (!) 22 18 (!) 21 (!) 22  Temp:    97.9 F (36.6 C)  TempSrc:    Oral  SpO2: 96% 96% 98% 96%  Weight:      Height:         PHYSICAL EXAM General:  Alert, well-nourished, well-developed pleasant elderly Caucasian  lady in no acute distress Psych:  Mood and affect appropriate for situation CV: Regular rate and rhythm on monitor Respiratory:  Regular, unlabored respirations on room air GI: Abdomen soft and nontender   NEURO:  Mental Status: AA&Ox3, patient is able to give clear and coherent history Speech/Language: speech is without dysarthria or aphasia.  Naming, repetition, fluency, and comprehension intact.  Cranial Nerves:  II: PERRL. Visual fields full.  III, IV, VI: EOMI. Eyelids elevate symmetrically.  V: Sensation is intact to light touch and symmetrical to face.  VII: Face is symmetrical resting and smiling VIII: hearing intact to voice. IX, X: Palate elevates symmetrically. Phonation is normal.  ZO:[id number] shrug 5/5. XII: tongue is midline without fasciculations. Motor: Bilateral upper extremities full strength Left lower extremity 2/5  Right upper extremity 5/5 Tone: is normal and bulk is normal Sensation- Intact to light touch bilaterally. Extinction absent to light touch to DSS.   Coordination: FTN intact bilaterally, HKS: no ataxia in BLE.No drift.  Gait- deferred  Most Recent NIH 3     ASSESSMENT/PLAN  Summer Hawkins is a 89 y.o. female with history of  HTN, DM2, hypothyroidism, paroxysmal atrial fibrillation, symptomatic bradycardia s/p PPM, CKD stage IIIb, history of breast cancer, B12 deficiency, obesity who presented to [facility] ED.  NIH on Admission 12  Acute Ischemic Infarct:  left hemisphere infarct  Etiology:  M2 stenosis vs occlusion in the setting of afib on eliquis   Code Stroke CT head No acute abnormality. ASPECTS 10.  CTA head & neck M3 versus very distal M2 occlusion, positive for completely occluded right ICA at distal bulb likely chronic  2D Echo EF 60-65%, LA dilated  LDL 79 HgbA1c 7.1 VTE prophylaxis - Eliquis  Eliquis  (apixaban ) daily prior to admission, now on Eliquis  (apixaban ) daily  Therapy recommendations:  SNF Disposition:  Pending    Atrial fibrillation Home Meds: Eliquis   Continue telemetry monitoring Continue Eliquis    Hypertension Home meds: Norvasc  5 mg, irbesartan  300 mg, metoprolol  25 mg Stable Blood Pressure Goal: BP less than 220/110  Avoid hypotension  Hyperlipidemia LDL 79, goal < 70 Add Crestor  if patient agreeable, she has had intolerance to statin in the past Continue statin at discharge  Diabetes type II Uncontrolled Home meds: Insulin  HgbA1c 7.1, goal < 7.0 CBGs SSI Recommend close follow-up with PCP for better DM control  Dysphagia Patient has post-stroke dysphagia, SLP consulted    Diet   DIET DYS 3 Room service appropriate? Yes; Fluid consistency: Thin   Advance diet as tolerated  Other Stroke Risk Factors Obesity, Body mass index is 35.27 kg/m., BMI >/= 30 associated with increased stroke risk, recommend weight loss, diet and exercise as appropriate   Other Active Problems CKD stage III Cr 2.03 - appears near baseline  BMP in the morning Hypothyroidism Resume home meds   Hospital day # 0  Patient seen and examined by NP/APP with MD. MD to update note as needed.   Summer Mana, DNP, FNP-BC [facility] Pager: [phone]  I have personally obtained history,examined this patient, reviewed notes, independently viewed imaging studies, participated in medical decision making and plan of care.ROS completed by me personally and pertinent positives fully documented  I have made any additions or clarifications directly to the above note. Agree with note above.  Patient presented with fluctuating symptoms of expressive aphasia and facial droop due to left distal M2/M3 occlusion due to A-fib despite being on anticoagulation with Eliquis .  She has been admitted to ICU for close monitoring and has remained stable overnight.  Recommend repeat CT scan of the head as patient unable to obtain MRI due to incompatible pacemaker.  Mobilize out of bed.  Physical Occupational  Therapy speech therapy consults.  Continue ongoing stroke workup.  Aggressive risk factor modification.  Continue Eliquis  as there is no definitive data suggesting switching to an alternative anticoagulant is necessary superior.  In case patient has worsening or recurrent symptoms may need to consider switching to IV heparin  if anticoagulation is not effective.  Her neurological deficits are not severe enough to justify mechanical intervention which would be difficult anyhow given the distal location of the occluded vessel.    This patient is critically ill and at significant risk of neurological worsening, death and care requires constant monitoring of vital signs, hemodynamics,respiratory and cardiac monitoring, extensive review of multiple databases, frequent neurological assessment, discussion with family, other specialists and medical decision making of high complexity.I have made any additions or clarifications directly to the above note.This critical care time does not reflect procedure time, or teaching time or supervisory time of PA/NP/Med Resident etc but could involve care discussion time.  I spent 30 minutes of neurocritical care time  in the care of  this patient.     Ardella Beaver, MD Medical Director [facility] Pager: [phone] 08/16/2023 4:54 PM   To contact Stroke Continuity provider, please refer to [email or web address]. After hours, contact General Neurology

## 2023-08-17 DIAGNOSIS — R29703 NIHSS score 3: Secondary | ICD-10-CM | POA: Diagnosis not present

## 2023-08-17 DIAGNOSIS — I10 Essential (primary) hypertension: Secondary | ICD-10-CM | POA: Diagnosis not present

## 2023-08-17 DIAGNOSIS — I130 Hypertensive heart and chronic kidney disease with heart failure and stage 1 through stage 4 chronic kidney disease, or unspecified chronic kidney disease: Secondary | ICD-10-CM | POA: Diagnosis not present

## 2023-08-17 DIAGNOSIS — I6602 Occlusion and stenosis of left middle cerebral artery: Secondary | ICD-10-CM | POA: Diagnosis not present

## 2023-08-17 DIAGNOSIS — N183 Chronic kidney disease, stage 3 unspecified: Secondary | ICD-10-CM | POA: Diagnosis not present

## 2023-08-17 DIAGNOSIS — I639 Cerebral infarction, unspecified: Secondary | ICD-10-CM | POA: Diagnosis not present

## 2023-08-17 DIAGNOSIS — I480 Paroxysmal atrial fibrillation: Secondary | ICD-10-CM | POA: Diagnosis not present

## 2023-08-17 DIAGNOSIS — E785 Hyperlipidemia, unspecified: Secondary | ICD-10-CM | POA: Diagnosis not present

## 2023-08-17 DIAGNOSIS — R41841 Cognitive communication deficit: Secondary | ICD-10-CM | POA: Diagnosis not present

## 2023-08-17 DIAGNOSIS — I69398 Other sequelae of cerebral infarction: Secondary | ICD-10-CM | POA: Diagnosis not present

## 2023-08-17 DIAGNOSIS — I129 Hypertensive chronic kidney disease with stage 1 through stage 4 chronic kidney disease, or unspecified chronic kidney disease: Secondary | ICD-10-CM | POA: Diagnosis not present

## 2023-08-17 DIAGNOSIS — N1832 Chronic kidney disease, stage 3b: Secondary | ICD-10-CM | POA: Diagnosis not present

## 2023-08-17 DIAGNOSIS — C50412 Malignant neoplasm of upper-outer quadrant of left female breast: Secondary | ICD-10-CM | POA: Diagnosis not present

## 2023-08-17 DIAGNOSIS — R5381 Other malaise: Secondary | ICD-10-CM | POA: Diagnosis not present

## 2023-08-17 DIAGNOSIS — G459 Transient cerebral ischemic attack, unspecified: Secondary | ICD-10-CM | POA: Diagnosis not present

## 2023-08-17 DIAGNOSIS — R2681 Unsteadiness on feet: Secondary | ICD-10-CM | POA: Diagnosis not present

## 2023-08-17 DIAGNOSIS — Z7901 Long term (current) use of anticoagulants: Secondary | ICD-10-CM | POA: Diagnosis not present

## 2023-08-17 DIAGNOSIS — I4821 Permanent atrial fibrillation: Secondary | ICD-10-CM | POA: Diagnosis not present

## 2023-08-17 DIAGNOSIS — I4891 Unspecified atrial fibrillation: Secondary | ICD-10-CM | POA: Diagnosis not present

## 2023-08-17 DIAGNOSIS — R2689 Other abnormalities of gait and mobility: Secondary | ICD-10-CM | POA: Diagnosis not present

## 2023-08-17 DIAGNOSIS — I5032 Chronic diastolic (congestive) heart failure: Secondary | ICD-10-CM | POA: Diagnosis not present

## 2023-08-17 DIAGNOSIS — R29810 Facial weakness: Secondary | ICD-10-CM | POA: Diagnosis not present

## 2023-08-17 DIAGNOSIS — E1122 Type 2 diabetes mellitus with diabetic chronic kidney disease: Secondary | ICD-10-CM | POA: Diagnosis not present

## 2023-08-17 DIAGNOSIS — I69391 Dysphagia following cerebral infarction: Secondary | ICD-10-CM | POA: Diagnosis not present

## 2023-08-17 DIAGNOSIS — Z7401 Bed confinement status: Secondary | ICD-10-CM | POA: Diagnosis not present

## 2023-08-17 DIAGNOSIS — Z950 Presence of cardiac pacemaker: Secondary | ICD-10-CM | POA: Diagnosis not present

## 2023-08-17 DIAGNOSIS — R001 Bradycardia, unspecified: Secondary | ICD-10-CM | POA: Diagnosis not present

## 2023-08-17 DIAGNOSIS — M6281 Muscle weakness (generalized): Secondary | ICD-10-CM | POA: Diagnosis not present

## 2023-08-17 DIAGNOSIS — R4781 Slurred speech: Secondary | ICD-10-CM | POA: Diagnosis not present

## 2023-08-17 DIAGNOSIS — E039 Hypothyroidism, unspecified: Secondary | ICD-10-CM | POA: Diagnosis not present

## 2023-08-17 DIAGNOSIS — I63512 Cerebral infarction due to unspecified occlusion or stenosis of left middle cerebral artery: Secondary | ICD-10-CM | POA: Diagnosis not present

## 2023-08-17 LAB — CBC
HCT: 42.1 % (ref 36.0–46.0)
Hemoglobin: 13.8 g/dL (ref 12.0–15.0)
MCH: 30.9 pg (ref 26.0–34.0)
MCHC: 32.8 g/dL (ref 30.0–36.0)
MCV: 94.2 fL (ref 80.0–100.0)
Platelets: 259 10*3/uL (ref 150–400)
RBC: 4.47 MIL/uL (ref 3.87–5.11)
RDW: 13.2 % (ref 11.5–15.5)
WBC: 9.2 10*3/uL (ref 4.0–10.5)
nRBC: 0 % (ref 0.0–0.2)

## 2023-08-17 LAB — BASIC METABOLIC PANEL WITH GFR
Anion gap: 11 (ref 5–15)
BUN: 27 mg/dL — ABNORMAL HIGH (ref 8–23)
CO2: 21 mmol/L — ABNORMAL LOW (ref 22–32)
Calcium: 9.2 mg/dL (ref 8.9–10.3)
Chloride: 106 mmol/L (ref 98–111)
Creatinine, Ser: 1.79 mg/dL — ABNORMAL HIGH (ref 0.44–1.00)
GFR, Estimated: 27 mL/min — ABNORMAL LOW (ref >60)
Glucose, Bld: 147 mg/dL — ABNORMAL HIGH (ref 70–99)
Potassium: 4 mmol/L (ref 3.5–5.1)
Sodium: 138 mmol/L (ref 135–145)

## 2023-08-17 LAB — GLUCOSE, CAPILLARY
Glucose-Capillary: 154 mg/dL — ABNORMAL HIGH (ref 70–99)
Glucose-Capillary: 179 mg/dL — ABNORMAL HIGH (ref 70–99)
Glucose-Capillary: 214 mg/dL — ABNORMAL HIGH (ref 70–99)

## 2023-08-17 MED ORDER — ATORVASTATIN CALCIUM 10 MG PO TABS
20.0000 mg | ORAL_TABLET | Freq: Every day | ORAL | Status: DC
  Administered 2023-08-17: 20 mg via ORAL
  Filled 2023-08-17: qty 2

## 2023-08-17 MED ORDER — ATORVASTATIN CALCIUM 20 MG PO TABS: 20.0000 mg | ORAL_TABLET | Freq: Every day | ORAL | Status: DC

## 2023-08-17 NOTE — TOC Progression Note (Signed)
 Transition of Care [facility]) - Progression Note    Patient Details  Name: Summer Hawkins MRN: [id number] Date of Birth: [birth year]  Transition of Care [facility]) CM/SW Contact  Jannice Mends, LCSW Phone Number: 08/17/2023, 10:52 AM  Clinical Narrative:    Ricky Charter DC Summary to [facility]. They completed paperwork with granddaughter yesterday and are ready for patient. Will provide room number shortly.    Expected Discharge Plan: Skilled Nursing Facility Barriers to Discharge: Barriers Resolved  Expected Discharge Plan and Services In-house Referral: Clinical Social Work   Post Acute Care Choice: Skilled Nursing Facility Living arrangements for the past 2 months: Single Family Home Expected Discharge Date: 08/17/23                                     Social Determinants of Health (SDOH) Interventions SDOH Screenings   Food Insecurity: No Food Insecurity (08/16/2023)  Housing: Low Risk  (08/16/2023)  Transportation Needs: No Transportation Needs (08/16/2023)  Utilities: Not At Risk (08/16/2023)  Depression (PHQ2-9): Low Risk  (07/10/2022)  Social Connections: Moderately Isolated (08/16/2023)  Tobacco Use: Low Risk  (08/15/2023)    Readmission Risk Interventions     No data to display

## 2023-08-17 NOTE — TOC Transition Note (Signed)
 Transition of Care [facility]) - Discharge Note   Patient Details  Name: Summer Hawkins MRN: [id number] Date of Birth: [birth year]  [facility] of [facility]) CM/SW Contact:  Jannice Mends, LCSW Phone Number: 08/17/2023, 12:42 PM   Clinical Narrative:    Patient will DC to: [facility] Anticipated DC date: 08/17/23 Family notified: Granddaughter, Surveyor, minerals by: Lyna Sandhoff   Per MD patient ready for DC to [facility]. RN to call report prior to discharge [phone] room 114). RN, patient, patient's family, and facility notified of DC. Discharge Summary and FL2 sent to facility. DC packet on chart including signed DNR. Ambulance transport requested for patient.   CSW will sign off for now as social work intervention is no longer needed. Please consult us  again if new needs arise.     Final next level of care: Skilled Nursing Facility Barriers to Discharge: Barriers Resolved   Patient Goals and CMS Choice Patient states their goals for this hospitalization and ongoing recovery are:: Rehab CMS Medicare.gov Compare Post Acute Care list provided to:: Patient Choice offered to / list presented to : Patient [place] ownership interest in [facility].provided to:: Patient    Discharge Placement   Existing PASRR number confirmed : 08/17/23          Patient chooses bed at: [facility] Patient to be transferred to facility by: [facility] Name of family member notified: Granddaughter Patient and family notified of of transfer: 08/17/23  Discharge Plan and Services Additional resources added to the After Visit Summary for   In-house Referral: Clinical Social Work   Post Acute Care Choice: Skilled Nursing Facility                               Social Drivers of Health (SDOH) Interventions SDOH Screenings   Food Insecurity: No Food Insecurity (08/16/2023)  Housing: Low Risk  (08/16/2023)  Transportation Needs: No Transportation Needs (08/16/2023)  Utilities:  Not At Risk (08/16/2023)  Depression (PHQ2-9): Low Risk  (07/10/2022)  Social Connections: Moderately Isolated (08/16/2023)  Tobacco Use: Low Risk  (08/15/2023)     Readmission Risk Interventions     No data to display

## 2023-08-17 NOTE — Progress Notes (Signed)
 STROKE TEAM PROGRESS NOTE   INTERIM HISTORY/SUBJECTIVE Patient sitting up in bed.  She is neurologically stable without recurrence speech difficulties.  Repeat CT head shows no acute abnormality.  Vital signs stable. OBJECTIVE  CBC    Component Value Date/Time   WBC 9.2 08/17/2023 0734   RBC 4.47 08/17/2023 0734   HGB 13.8 08/17/2023 0734   HGB 13.9 01/06/2016 1533   HCT 42.1 08/17/2023 0734   HCT 41.9 01/06/2016 1533   PLT 259 08/17/2023 0734   PLT 246 01/06/2016 1533   MCV 94.2 08/17/2023 0734   MCV 93.1 01/06/2016 1533   MCH 30.9 08/17/2023 0734   MCHC 32.8 08/17/2023 0734   RDW 13.2 08/17/2023 0734   RDW 12.7 01/06/2016 1533   LYMPHSABS 1.9 08/15/2023 1039   LYMPHSABS 3.1 01/06/2016 1533   MONOABS 1.0 08/15/2023 1039   MONOABS 0.9 01/06/2016 1533   EOSABS 0.4 08/15/2023 1039   EOSABS 0.7 (H) 01/06/2016 1533   BASOSABS 0.1 08/15/2023 1039   BASOSABS 0.1 01/06/2016 1533    BMET    Component Value Date/Time   NA 138 08/17/2023 0734   NA 140 05/15/2018 1106   NA 138 01/06/2016 1533   K 4.0 08/17/2023 0734   K 4.2 01/06/2016 1533   CL 106 08/17/2023 0734   CO2 21 (L) 08/17/2023 0734   CO2 24 01/06/2016 1533   GLUCOSE 147 (H) 08/17/2023 0734   GLUCOSE 234 (H) 01/06/2016 1533   BUN 27 (H) 08/17/2023 0734   BUN 26 05/15/2018 1106   BUN 16.8 01/06/2016 1533   CREATININE 1.79 (H) 08/17/2023 0734   CREATININE 2.06 (H) 06/07/2022 0338   CREATININE 0.9 01/06/2016 1533   CALCIUM  9.2 08/17/2023 0734   CALCIUM  9.1 01/06/2016 1533   EGFR 23 (L) 06/07/2022 0338   GFRNONAA 27 (L) 08/17/2023 0734    IMAGING past 24 hours CT HEAD POST STROKE FOLLOWUP/TIMED/STAT READ Result Date: 08/16/2023 CLINICAL DATA:  Code stroke. Provided history: Neuro deficit, acute, stroke suspected. EXAM: CT HEAD WITHOUT CONTRAST TECHNIQUE: Contiguous axial images were obtained from the base of the skull through the vertex without intravenous contrast. RADIATION DOSE REDUCTION: This exam was  performed according to the departmental dose-optimization program which includes automated exposure control, adjustment of the mA and/or kV according to patient size and/or use of iterative reconstruction technique. COMPARISON:  Non-contrast head CT, CT angiogram head/neck and CT perfusion head examinations 08/15/2023. FINDINGS: Brain: Generalized cerebral atrophy. Patchy and ill-defined hypoattenuation within the cerebral white matter, nonspecific but compatible with mild-to-moderate chronic small vessel ischemic disease. There is no acute intracranial hemorrhage. No demarcated cortical infarct. No extra-axial fluid collection. No evidence of an intracranial mass. No midline shift. Vascular: See CTA head/neck examinations performed 08/15/2023. Skull: No calvarial fracture or aggressive osseous lesion. Sinuses/Orbits: No mass or acute finding within the imaged orbits. Trace mucosal thickening scattered within bilateral ethmoid air cells. IMPRESSION: 1. No acute intracranial hemorrhage or evidence of an acute infarct. 2. Parenchymal atrophy and chronic small vessel ischemic disease. Electronically Signed   By: Bascom Lily D.O.   On: 08/16/2023 17:47    Vitals:   08/17/23 1200 08/17/23 1208 08/17/23 1300 08/17/23 1400  BP:      Pulse: 70 71 69 70  Resp: 14 17 15 19   Temp:  97.8 F (36.6 C)    TempSrc:  Oral    SpO2: 98% 98% 97% 98%  Weight:      Height:         PHYSICAL  EXAM General:  Alert, well-nourished, well-developed pleasant elderly Caucasian lady in no acute distress Psych:  Mood and affect appropriate for situation CV: Regular rate and rhythm on monitor Respiratory:  Regular, unlabored respirations on room air GI: Abdomen soft and nontender   NEURO:  Mental Status: AA&Ox3, patient is able to give clear and coherent history Speech/Language: speech is without dysarthria or aphasia.  Naming, repetition, fluency, and comprehension intact.  Cranial Nerves:  II: PERRL. Visual fields  full.  III, IV, VI: EOMI. Eyelids elevate symmetrically.  V: Sensation is intact to light touch and symmetrical to face.  VII: Face is symmetrical resting and smiling VIII: hearing intact to voice. IX, X: Palate elevates symmetrically. Phonation is normal.  WU:[id number] shrug 5/5. XII: tongue is midline without fasciculations. Motor: Bilateral upper extremities full strength Left lower extremity 2/5  Right upper extremity 5/5 Tone: is normal and bulk is normal Sensation- Intact to light touch bilaterally. Extinction absent to light touch to DSS.   Coordination: FTN intact bilaterally, HKS: no ataxia in BLE.No drift.  Gait- deferred  Most Recent NIH 3     ASSESSMENT/PLAN  Ms. THEADORA NOYES is a 89 y.o. female with history of  HTN, DM2, hypothyroidism, paroxysmal atrial fibrillation, symptomatic bradycardia s/p PPM, CKD stage IIIb, history of breast cancer, B12 deficiency, obesity who presented to [facility] ED.  NIH on Admission 12  Acute Ischemic Infarct:  left hemisphere infarct  Etiology:  M2 stenosis vs occlusion in the setting of afib on eliquis   Code Stroke CT head No acute abnormality. ASPECTS 10.     CTA head & neck M3 versus very distal M2 occlusion, positive for completely occluded right ICA at distal bulb likely chronic  2D Echo EF 60-65%, LA dilated  LDL 79 HgbA1c 7.1 VTE prophylaxis - Eliquis  Eliquis  (apixaban ) daily prior to admission, now on Eliquis  (apixaban ) daily  Therapy recommendations:  SNF Disposition:  Pending   Atrial fibrillation Home Meds: Eliquis   Continue telemetry monitoring Continue Eliquis    Hypertension Home meds: Norvasc  5 mg, irbesartan  300 mg, metoprolol  25 mg Stable Blood Pressure Goal: BP less than 220/110  Avoid hypotension  Hyperlipidemia LDL 79, goal < 70 Add Crestor  if patient agreeable, she has had intolerance to statin in the past Continue statin at discharge  Diabetes type II Uncontrolled Home meds: Insulin  HgbA1c 7.1, goal <  7.0 CBGs SSI Recommend close follow-up with PCP for better DM control  Dysphagia Patient has post-stroke dysphagia, SLP consulted    Diet   Diet heart healthy/carb modified Room service appropriate? Yes with Assist; Fluid consistency: Thin   Advance diet as tolerated  Other Stroke Risk Factors Obesity, Body mass index is 35.27 kg/m., BMI >/= 30 associated with increased stroke risk, recommend weight loss, diet and exercise as appropriate   Other Active Problems CKD stage III Cr 2.03 - appears near baseline  BMP in the morning Hypothyroidism Resume home meds   Hospital day # 0   Patient presented with fluctuating symptoms of expressive aphasia and facial droop due to left distal M2/M3 occlusion due to A-fib despite being on anticoagulation with Eliquis .  She has been admitted to ICU for close monitoring and has remained stable overnight.  Recommend repeat CT scan of the head as patient unable to obtain MRI due to incompatible pacemaker.  Mobilize out of bed.  Physical Occupational Therapy speech therapy consults.  Continue ongoing stroke workup.  Aggressive risk factor modification.  Continue Eliquis  as there is no definitive data suggesting  switching to an alternative anticoagulant is necessary superior.  In case patient has worsening or recurrent symptoms may need to consider switching to IV heparin  if anticoagulation is not effective.  Her neurological deficits are not severe enough to justify mechanical intervention which would be difficult anyhow given the distal location of the occluded vessel.    Transfer out of ICU.  Mobilize out of bed.  Therapy consults.  Transferred back to skilled nursing facility when bed available.  Discussed with Dr. [place].  Stroke team will sign off.  Greater than 50% time during this 35-minute visit was spent in counseling and coordination of care and discussion with patient and care team and answering questions.    Ardella Beaver, MD Medical  Director [facility] Pager: [phone] 08/17/2023 3:13 PM   To contact Stroke Continuity provider, please refer to [email or web address]. After hours, contact General Neurology

## 2023-08-17 NOTE — Discharge Summary (Addendum)
 Physician Discharge Summary  Summer Hawkins WUJ:[id number] DOB: November 02, 1934 DOA: 08/15/2023  PCP: Suzzanne Estrin, MD  Admit date: 08/15/2023 Discharge date: 08/17/2023  Admitted From: Home Disposition: [facility]  Recommendations for Outpatient Follow-up:  Follow up with PCP in 1-2 weeks Outpatient follow-up with neurology 6-8 weeks Started on atorvastatin 20 mg p.o. daily per neurology   Discharge Condition: Stable CODE STATUS: DNR Diet recommendation: Heart healthy/consistent carbohydrate diet  History of present illness:  Summer Hawkins is a 89 y.o. female with past medical history significant for HTN, DM2, hypothyroidism, paroxysmal atrial fibrillation, symptomatic bradycardia s/p PPM, CKD stage IIIb, history of breast cancer, B12 deficiency, obesity who presented to [facility] ED on 08/15/2023 via EMS with facial droop, aphasia.  Last known normal at 0950, was with a friend at a restaurant eating breakfast.  And route via EMS, her aphasia was improving.  Patient denies fever, no chills, no chest pain, no shortness of breath, no abdominal pain, no constipation, no nausea/vomiting/diarrhea.   In the ED, temperature 98.7 F, HR 75, RR 18, BP 156/78, SpO2 97% on room air.  WBC 10.3, hemoglobin 13.5, platelet count 280.  Sodium 138, potassium 4.5, chloride 107, CO2 20, glucose 222, BUN 32, creatinine 1.96.  AST 20, ALT 14, total bilirubin 0.9.  EtOH level less than 15. CT head showed no acute abnormality. CTA head and neck showed LVO at left MCA branch and right ICA occlusion which appeared to be chronic.  MRI unable to be performed as patient's pacemaker not MRI compatible.  Neurology was consulted; TRH consulted for admission for further evaluation and management of acute CVA.  Hospital course:  Acute CVA Patient presenting to the ED via EMS after acute onset of aphasia, facial droop while eating breakfast with a friend at a restaurant.  Last known normal was at 0950 on day of admission.  CT  head without contrast with no acute process.  CTA head/neck positive for LVO of left MCA middle division, M3 versus very distal M2 occlusion, positive for completely occluded right ICA at distal bulb likely chronic.  MRI unable to obtain due to noncompatible pacemaker.  Neurology was consulted and discussed with neurointerventional radiology for consideration of left M2 intervention however patient's aphasia nearly resolved therefore discussion with patient and family, risk seem to outweigh any benefits and was not performed.  Hemoglobin A1c 7.1, LDL 79.  Neurology was consulted and followed during hospital course.  Patient was started on atorvastatin 20 mg p.o. daily by neurology.  Seen by PT/OT with recommendation of SNF placement.  SLP cleared for regular diet without restriction.  Outpatient follow-up with neurology 6-8 weeks.   HTN At baseline on amlodipine  5 mg p.o. daily, irbesartan  300 mg p.o. daily, metoprolol  succinate 25 mg p.o. daily   DM2 Hemoglobin A1c 7.1, fairly well-controlled.  Home regimen includes Toujeo  12 units [place] qAm and 10 units [place] at bedtime, metformin  850 mg p.o. daily.   Paroxysmal atrial fibrillation Metoprolol  succinate 25 mg p.o. daily, Eliquis  2.5 g p.o. twice daily   Symptomatic bradycardia s/p PPM [employer] placed cardiology on 12/02/2018.  Outpatient follow-up with cardiology.   Hypothyroidism Levothyroxine  137 mcg p.o. daily   CKD stage IIIb Baseline creatinine 2.00.  Creatinine on admission 1.96, stable.   Obesity, class II Body mass index is 35.27 kg/m.  Discharge Diagnoses:  Principal Problem:   Acute CVA (cerebrovascular accident) [facility]) Active Problems:   Breast cancer of upper-outer quadrant of left female breast (HCC)  Chronic diastolic CHF (congestive heart failure) (HCC)   Essential hypertension   Permanent atrial fibrillation (HCC)   Symptomatic bradycardia   Pacemaker   Type 2 diabetes mellitus without complication, with long-term current  use of insulin  (HCC)   Hypothyroidism   CKD (chronic kidney disease), stage III (HCC)   Morbid obesity [facility])    Discharge Instructions  Discharge Instructions     Ambulatory referral to Neurology   Complete by: As directed    An appointment is requested in approximately: 8 weeks   Call MD for:  difficulty breathing, headache or visual disturbances   Complete by: As directed    Call MD for:  extreme fatigue   Complete by: As directed    Call MD for:  persistant dizziness or light-headedness   Complete by: As directed    Call MD for:  persistant nausea and vomiting   Complete by: As directed    Call MD for:  severe uncontrolled pain   Complete by: As directed    Call MD for:  temperature >100.4   Complete by: As directed    Diet - low sodium heart healthy   Complete by: As directed    Increase activity slowly   Complete by: As directed       Allergies as of 08/17/2023       Reactions   Bee Venom Shortness Of Breath, Nausea And Vomiting, Other (See Comments)   Makes the patient feel faint, also   Penicillins Anaphylaxis, Hives, Swelling   Lisinopril Cough   Zocor [simvastatin] Other (See Comments)   memory changes   Liraglutide Rash, Other (See Comments)   Rash at injection site        Medication List     TAKE these medications    amLODipine  5 MG tablet Commonly known as: NORVASC  TAKE 1 TABLET BY MOUTH DAILY IN  THE MORNING What changed: when to take this   apixaban  2.5 MG Tabs tablet Commonly known as: ELIQUIS  Take 2.5 mg by mouth 2 (two) times daily.   atorvastatin 20 MG tablet Commonly known as: LIPITOR Take 1 tablet (20 mg total) by mouth daily. Start taking on: August 18, 2023   B-12 PO Take 1 tablet by mouth daily.   ferrous sulfate  325 (65 FE) MG tablet Take 325 mg by mouth daily with breakfast.   irbesartan  300 MG tablet Commonly known as: AVAPRO  Take 300 mg by mouth daily.   levothyroxine  125 MCG tablet Commonly known as: SYNTHROID  Take  125 mcg by mouth every evening.   metFORMIN  850 MG tablet Commonly known as: GLUCOPHAGE  Take 850 mg by mouth daily.   metoprolol  succinate 25 MG 24 hr tablet Commonly known as: TOPROL -XL TAKE 1 TABLET BY MOUTH ONCE  DAILY   multivitamin with minerals tablet Take 1 tablet by mouth in the morning.   timolol  0.5 % ophthalmic solution Commonly known as: TIMOPTIC  Place 1 drop into both eyes every morning.   Toujeo  SoloStar 300 UNIT/ML Solostar Pen Generic drug: insulin  glargine (1 Unit Dial ) Inject 10-12 Units into the skin See admin instructions. Inject 12 units into the skin in the morning and then inject 10 units at night        Follow-up Information     Tisovec, Kristina Pfeiffer, MD. Schedule an appointment as soon as possible for a visit in 1 week(s).   Specialty: Internal Medicine Contact information: [address] [place] [address] [phone]  Allergies  Allergen Reactions   Bee Venom Shortness Of Breath, Nausea And Vomiting and Other (See Comments)    Makes the patient feel faint, also   Penicillins Anaphylaxis, Hives and Swelling   Lisinopril Cough   Zocor [Simvastatin] Other (See Comments)    memory changes   Liraglutide Rash and Other (See Comments)    Rash at injection site    Consultations: Neurology, Dr. Janett Medin   Procedures/Studies: CT HEAD POST STROKE FOLLOWUP/TIMED/STAT READ Result Date: 08/16/2023 CLINICAL DATA:  Code stroke. Provided history: Neuro deficit, acute, stroke suspected. EXAM: CT HEAD WITHOUT CONTRAST TECHNIQUE: Contiguous axial images were obtained from the base of the skull through the vertex without intravenous contrast. RADIATION DOSE REDUCTION: This exam was performed according to the departmental dose-optimization program which includes automated exposure control, adjustment of the mA and/or kV according to patient size and/or use of iterative reconstruction technique. COMPARISON:  Non-contrast head CT, CT  angiogram head/neck and CT perfusion head examinations 08/15/2023. FINDINGS: Brain: Generalized cerebral atrophy. Patchy and ill-defined hypoattenuation within the cerebral white matter, nonspecific but compatible with mild-to-moderate chronic small vessel ischemic disease. There is no acute intracranial hemorrhage. No demarcated cortical infarct. No extra-axial fluid collection. No evidence of an intracranial mass. No midline shift. Vascular: See CTA head/neck examinations performed 08/15/2023. Skull: No calvarial fracture or aggressive osseous lesion. Sinuses/Orbits: No mass or acute finding within the imaged orbits. Trace mucosal thickening scattered within bilateral ethmoid air cells. IMPRESSION: 1. No acute intracranial hemorrhage or evidence of an acute infarct. 2. Parenchymal atrophy and chronic small vessel ischemic disease. Electronically Signed   By: Bascom Lily D.O.   On: 08/16/2023 17:47   ECHOCARDIOGRAM COMPLETE Result Date: 08/16/2023    ECHOCARDIOGRAM REPORT   Patient Name:   MIXTLI RENO Date of Exam: 08/16/2023 Medical Rec #:  [id number]      Height:       64.0 in Accession #:    [id number]     Weight:       205.5 lb Date of Birth:  [birth year]      BSA:          1.979 m Patient Age:    89 years       BP:           162/68 mmHg Patient Gender: F              HR:           70 bpm. Exam Location:  Inpatient Procedure: 2D Echo, Color Doppler and Cardiac Doppler (Both Spectral and Color            Flow Doppler were utilized during procedure). Indications:    Stroke  History:        Patient has prior history of Echocardiogram examinations, most                 recent 07/26/2017.  Sonographer:    Andrena Bang Referring Phys: [id number] Gayl Katos MELVIN IMPRESSIONS  1. Left ventricular ejection fraction, by estimation, is 60 to 65%. The left ventricle has normal function. The left ventricle has no regional wall motion abnormalities. There is mild left ventricular hypertrophy. Left ventricular diastolic  parameters were normal.  2. Right ventricular systolic function is mildly reduced. The right ventricular size is mildly enlarged. There is normal pulmonary artery systolic pressure. The estimated right ventricular systolic pressure is 28.2 mmHg.  3. Left atrial size was moderately dilated.  4. The mitral valve is normal in structure.  No evidence of mitral valve regurgitation. No evidence of mitral stenosis.  5. The aortic valve is normal in structure. Aortic valve regurgitation is not visualized. No aortic stenosis is present.  6. The inferior vena cava is normal in size with greater than 50% respiratory variability, suggesting right atrial pressure of 3 mmHg. FINDINGS  Left Ventricle: Left ventricular ejection fraction, by estimation, is 60 to 65%. The left ventricle has normal function. The left ventricle has no regional wall motion abnormalities. Definity  contrast agent was given IV to delineate the left ventricular  endocardial borders. The left ventricular internal cavity size was normal in size. There is mild left ventricular hypertrophy. Left ventricular diastolic parameters were normal. Right Ventricle: The right ventricular size is mildly enlarged. No increase in right ventricular wall thickness. Right ventricular systolic function is mildly reduced. There is normal pulmonary artery systolic pressure. The tricuspid regurgitant velocity  is 2.51 m/s, and with an assumed right atrial pressure of 3 mmHg, the estimated right ventricular systolic pressure is 28.2 mmHg. Left Atrium: Left atrial size was moderately dilated. Right Atrium: Right atrial size was normal in size. Pericardium: There is no evidence of pericardial effusion. Mitral Valve: The mitral valve is normal in structure. Mild mitral annular calcification. No evidence of mitral valve regurgitation. No evidence of mitral valve stenosis. Tricuspid Valve: The tricuspid valve is normal in structure. Tricuspid valve regurgitation is mild . No evidence of  tricuspid stenosis. Aortic Valve: The aortic valve is normal in structure. Aortic valve regurgitation is not visualized. No aortic stenosis is present. Aortic valve mean gradient measures 3.0 mmHg. Aortic valve peak gradient measures 6.0 mmHg. Aortic valve area, by VTI measures 0.99 cm. Pulmonic Valve: The pulmonic valve was normal in structure. Pulmonic valve regurgitation is not visualized. No evidence of pulmonic stenosis. Aorta: The aortic root is normal in size and structure. Venous: The inferior vena cava is normal in size with greater than 50% respiratory variability, suggesting right atrial pressure of 3 mmHg. IAS/Shunts: No atrial level shunt detected by color flow Doppler. Additional Comments: A device lead is visualized in the right ventricle.  LEFT VENTRICLE PLAX 2D LVIDd:         4.00 cm     Diastology LVIDs:         2.30 cm     LV e' lateral: 9.25 cm/s LV PW:         1.10 cm LV IVS:        1.20 cm LVOT diam:     1.50 cm LV SV:         26 LV SV Index:   13 LVOT Area:     1.77 cm  LV Volumes (MOD) LV vol d, MOD A2C: 87.5 ml LV vol d, MOD A4C: 83.6 ml LV vol s, MOD A2C: 29.4 ml LV vol s, MOD A4C: 25.1 ml LV SV MOD A2C:     58.1 ml LV SV MOD A4C:     83.6 ml LV SV MOD BP:      60.6 ml RIGHT VENTRICLE RV S prime:     9.68 cm/s TAPSE (M-mode): 1.9 cm LEFT ATRIUM             Index LA diam:        3.70 cm 1.87 cm/m LA Vol (A2C):   91.6 ml 46.29 ml/m LA Vol (A4C):   81.6 ml 41.23 ml/m LA Biplane Vol: 88.6 ml 44.77 ml/m  AORTIC VALVE AV Area (Vmax):    0.91  cm AV Area (Vmean):   0.83 cm AV Area (VTI):     0.99 cm AV Vmax:           122.00 cm/s AV Vmean:          78.600 cm/s AV VTI:            0.259 m AV Peak Grad:      6.0 mmHg AV Mean Grad:      3.0 mmHg LVOT Vmax:         62.50 cm/s LVOT Vmean:        36.900 cm/s LVOT VTI:          0.145 m LVOT/AV VTI ratio: 0.56  AORTA Ao Asc diam: 3.70 cm TRICUSPID VALVE TR Peak grad:   25.2 mmHg TR Vmax:        251.00 cm/s  SHUNTS Systemic VTI:  0.14 m Systemic  Diam: 1.50 cm Dorothye Gathers MD Electronically signed by Dorothye Gathers MD Signature Date/Time: 08/16/2023/1:14:42 PM    Final    CT ANGIO HEAD NECK W WO CM W PERF (CODE STROKE) Result Date: 08/15/2023 CLINICAL DATA:  Neuro deficit, concern for stroke, severe aphasia. Concern for left M2/M3 occlusion. EXAM: CT ANGIOGRAPHY HEAD AND NECK CT PERFUSION BRAIN TECHNIQUE: Multidetector CT imaging of the head and neck was performed using the standard protocol during bolus administration of intravenous contrast. Multiplanar CT image reconstructions and MIPs were obtained to evaluate the vascular anatomy. Carotid stenosis measurements (when applicable) are obtained utilizing NASCET criteria, using the distal internal carotid diameter as the denominator. Multiphase CT imaging of the brain was performed following IV bolus contrast injection. Subsequent parametric perfusion maps were calculated using RAPID software. RADIATION DOSE REDUCTION: This exam was performed according to the departmental dose-optimization program which includes automated exposure control, adjustment of the mA and/or kV according to patient size and/or use of iterative reconstruction technique. CONTRAST:  100mL OMNIPAQUE IOHEXOL 350 MG/ML SOLN COMPARISON:  Earlier same day CTA head and neck. FINDINGS: CTA NECK FINDINGS Aortic arch: Standard configuration of the aortic arch. Imaged portion shows no evidence of aneurysm or dissection. Mild atherosclerosis. No significant stenosis of the major arch vessel origins. Pulmonary arteries: As permitted by contrast timing, there are no filling defects in the visualized pulmonary arteries. Subclavian arteries: Patent bilaterally. Atherosclerosis involving the proximal aspect of both subclavian arteries without significant stenosis. Right carotid system: The common carotid artery is patent from the origin to the carotid bifurcation. External carotid artery branches are patent. There is abrupt occlusion of the proximal right  cervical ICA. Mild atherosclerosis at the carotid bifurcation without high-grade stenosis. Left carotid system: Patent. Mild atherosclerosis at the carotid bifurcation without hemodynamically significant stenosis. No evidence of dissection. Vertebral arteries: Codominant. No evidence of dissection, stenosis (50% or greater), or occlusion. Skeleton: No acute findings. Degenerative changes in the cervical spine. Other neck: The visualized airway is patent. No cervical lymphadenopathy. Upper chest: Visualized lung apices are clear. Left-sided pacer device. Review of the MIP images confirms the above findings CTA HEAD FINDINGS ANTERIOR CIRCULATION: The right internal carotid artery is occluded from the cervical segment to the ICA terminus. Reconstitution of the right MCA and right ACA via the circle-of-Willis. The left ICA is patent from the skull base to the ICA terminus. Mild atherosclerosis of the left carotid siphon without high-grade stenosis. MCAs: The left M1 segment is patent. Redemonstrated occlusion of a distal M2 branch of the middle division of the left MCA noted on axial image 94.  Left MCA branches otherwise patent. Right M1 segment is patent. Right MCA branches are patent. ACAs: The anterior cerebral arteries are patent bilaterally. POSTERIOR CIRCULATION: No significant stenosis, proximal occlusion, aneurysm, or vascular malformation. PCAs: The posterior cerebral arteries are patent bilaterally. Pcomm: The posterior communicating arteries are visualized bilaterally. SCAs: The superior cerebellar arteries are patent bilaterally. Basilar artery: Patent AICAs: Patent PICAs: Patent Vertebral arteries: The intracranial vertebral arteries are patent. Venous sinuses: As permitted by contrast timing, patent. Anatomic variants: None Review of the MIP images confirms the above findings CT Brain Perfusion Findings: ASPECTS: 10 CBF (<30%) Volume: 0mL Perfusion (Tmax>6.0s) volume: 10mL Mismatch Volume: 10mL Infarction  Location:None identified. Region of elevated T-max within the left frontal lobe in the left MCA territory. IMPRESSION: Redemonstrated occlusion of a distal M2 middle division branch of the left MCA. Associated region of elevated T-max within the left frontal lobe concerning for hypoperfusion. No core infarct identified on CT perfusion. Redemonstrated occlusion of the right ICA from the proximal cervical segment to the ICA terminus, likely chronic. Reconstitution of the right MCA and ACA via the circle-of-Willis. Aortic Atherosclerosis (ICD10-I70.0). These results were called by telephone at the time of interpretation on 08/15/2023 at 5:25 pm to provider [facility] , who verbally acknowledged these results. Electronically Signed   By: Denny Flack M.D.   On: 08/15/2023 17:39   CT ANGIO HEAD NECK W WO CM (CODE STROKE) Result Date: 08/15/2023 CLINICAL DATA:  89 year old female.  Code stroke. EXAM: CT ANGIOGRAPHY HEAD AND NECK WITH AND WITHOUT CONTRAST TECHNIQUE: Multidetector CT imaging of the head and neck was performed using the standard protocol during bolus administration of intravenous contrast. Multiplanar CT image reconstructions and MIPs were obtained to evaluate the vascular anatomy. Carotid stenosis measurements (when applicable) are obtained utilizing NASCET criteria, using the distal internal carotid diameter as the denominator. RADIATION DOSE REDUCTION: This exam was performed according to the departmental dose-optimization program which includes automated exposure control, adjustment of the mA and/or kV according to patient size and/or use of iterative reconstruction technique. CONTRAST:  75mL OMNIPAQUE IOHEXOL 350 MG/ML SOLN COMPARISON:  Plain head CT 1046 hours today. FINDINGS: CTA NECK Skeleton: No acute osseous abnormality identified. Absent maxillary dentition. Spine degeneration. Upper chest: Negative. Other neck: Nonvascular neck soft tissue spaces are within normal limits. Aortic arch:  Calcified aortic atherosclerosis.  3 vessel arch. Right carotid system: Brachiocephalic artery and plaque without stenosis. Dense right subclavian venous contrast streak artifact affecting visualization of the proximal right CCA. However, before the level of the right carotid bifurcation there is relatively poor CCA enhancement compared to the left side (series 9, image 109). Faint enhancement through the right carotid bifurcation but the right ICA is definitely occluded by the level of the distal bulb (series 9, image 91). And there is no reconstitution to the skull base. Left carotid system: Tortuous proximal left CCA with mild calcified plaque at the origin. Mild for age calcified plaque at the left carotid bifurcation. Tortuosity in the neck below the skull base but no significant stenosis in the neck. Vertebral arteries: Calcified right subclavian artery origin without stenosis. Calcified plaque near the right vertebral artery origin without significant stenosis. Right vertebral is patent to the skull base with no additional plaque or stenosis. Proximal left subclavian soft and calcified plaque without stenosis. Patent left vertebral artery origin. Tortuous left V1 segment. Fairly codominant left vertebral artery is patent to the skull base with no significant plaque or stenosis. CTA HEAD Posterior circulation: Distal ritual  arteries, PICA origins, basilar artery, SCA and right PCA origins are patent without significant plaque or stenosis. There is a fetal type left PCA. A small right posterior communicating artery is present. Bilateral PCA branches are within normal limits. Anterior circulation: Right ICA siphon is occluded until reconstituted right MCA and ACA origins, right ICA terminus just distal to the right posterior communicating artery level. Slightly asymmetrically decreased right MCA and ACA compared to the left side. But relatively symmetric level of enhancement. No right MCA or ACA branch occlusion  identified. Left ICA siphon is patent with calcified plaque most pronounced in the supraclinoid segment, mild supraclinoid stenosis suspected on series 11, image 100. Patent left ICA terminus. Normal left posterior communicating artery. Left ACA A1, anterior communicating artery, proximal ACA branches are within normal limits. Left MCA M1 is patent and bifurcates relatively early without stenosis (series 13, image 32. Dominant superior division at that bifurcation then bifurcates a 2nd time without stenosis on series 11, image 124. But distal to that middle MCA division branch is occluded at its origin on series 11, image 132. This occlusion is also visible on series 7, images 109 through 107. And there is decreased middle left MCA division vessel density when compared to the right side. Venous sinuses: Early contrast timing, not evaluated. Anatomic variants: Fetal type left PCA origin. Review of the MIP images confirms the above findings Preliminary report of the above discussed by telephone with Dr. Greg Leaks on 08/15/2023 at 11:03 . IMPRESSION: 1. Positive for suspected ELVO: Left MCA middle division branch seen best on series 11, image 132. This is an M3 versus very distal M2 occlusion in light of somewhat early Left MCA bifurcation upstream of this. 2. Positive also for completely occluded Right ICA at the distal bulb, poor upstream Right CCA enhancement, no reconstitution and told the right ICA terminus. But upon discussion with Dr. Greg Leaks , this is most likely chronic. And there is relatively good collateral enhancement of the right ACA and MCA. 3. Other atherosclerosis in the head and neck without significant stenosis, including Left ICA supraclinoid calcified plaque with mild stenosis. 4.  Aortic Atherosclerosis (ICD10-I70.0). Preliminary report of the above discussed by telephone with Dr. Greg Leaks on 08/15/2023 at 11:03 . Electronically Signed   By: Marlise Simpers M.D.   On: 08/15/2023 11:11   CT  HEAD CODE STROKE WO CONTRAST Result Date: 08/15/2023 CLINICAL DATA:  Code stroke. 89 year old female with isolated aphasia. EXAM: CT HEAD WITHOUT CONTRAST TECHNIQUE: Contiguous axial images were obtained from the base of the skull through the vertex without intravenous contrast. RADIATION DOSE REDUCTION: This exam was performed according to the departmental dose-optimization program which includes automated exposure control, adjustment of the mA and/or kV according to patient size and/or use of iterative reconstruction technique. COMPARISON:  Head CT 11/25/2020. FINDINGS: Brain: Cerebral volume not significantly changed from 2022. No midline shift, ventriculomegaly, mass effect, evidence of mass lesion, intracranial hemorrhage or evidence of cortically based acute infarction. Patchy chronic cerebral white matter hypodensity appears stable since 2022. Stable gray-white matter differentiation throughout the brain. Faint chronic basal ganglia vascular calcifications. Vascular: Calcified atherosclerosis at the skull base. No suspicious intracranial vascular hyperdensity. Skull: Stable and intact.  No acute osseous abnormality identified. Sinuses/Orbits: Visualized paranasal sinuses and mastoids are clear. Other: No acute orbit or scalp soft tissue finding. ASPECTS [facility] Stroke Program Early CT Score) Total score (0-10 with 10 being normal): 10 IMPRESSION: 1. No acute cortically based infarct or acute intracranial  hemorrhage identified. ASPECTS 10. 2. Stable non contrast CT appearance of the brain since 2022. Mild for age white matter disease. 3. These results were communicated to Dr. Doretta Gant at 10:50 am on 08/15/2023 by text page via the [facility] messaging system. Electronically Signed   By: Marlise Simpers M.D.   On: 08/15/2023 10:51     Subjective: Seen examined bedside, lying in bed.  No complaints this morning.  Discharging to SNF.  Denies headache, no dizziness, no chest pain, no palpitations, no shortness of breath, no  abdominal pain, no fever/chills/night sweats, no nausea/vomiting/diarrhea, no focal weakness, no fatigue, no paresthesia.  No acute events overnight per nursing staff.  Discharge Exam: Vitals:   08/17/23 0800 08/17/23 0807  BP: (!) 180/91   Pulse: 71   Resp: (!) 22   Temp:  98.5 F (36.9 C)  SpO2: 97%    Vitals:   08/17/23 0600 08/17/23 0700 08/17/23 0800 08/17/23 0807  BP: (!) 165/79 (!) 180/78 (!) 180/91   Pulse: 69 66 71   Resp: 20 17 (!) 22   Temp:    98.5 F (36.9 C)  TempSrc:    Oral  SpO2: 95% 97% 97%   Weight:      Height:        Physical Exam: GEN: NAD, alert and oriented x 3, elderly in appearance,obese HEENT: NCAT, PERRL, EOMI, sclera clear, MMM PULM: CTAB w/o wheezes/crackles, normal respiratory effort, on room air CV: RRR w/o M/G/R GI: abd soft, NTND, NABS, no R/G/M MSK: no peripheral edema, muscle strength globally intact 5/5 bilateral upper/lower extremities PSYCH: normal mood/affect Integumentary: dry/intact, no rashes or wounds   Neuro Exam Mental Status: A&O x4, no dysarthria, no aphasia, concentration and attention were appropriate Cranial Nerves: visual fields full, PERRL, EOMi, intact smooth pursuit, no nystagmus, no ptosis, facial sensation intact bilaterally, 5/5 jaw strength, nasolabial fold & smile symetric,  eyebrow  raise & 5/5 eye closure symetric, hearing symmetric and normal to rubbing fingers, palate elevates symmetrically, head turning and shoulder shrug intact and symetric bilaterally, tongue protrusion is midline Motor: no pronator drift, LUE 5/5,   LLE 5/5,   RUE 5/5,   RLE 5/5   R. patellar 2+     R. achilles 2+        L. patellar 2+     L. achilles 2+  normal muscle bulk no significant atrophy, normal tone, no spasticity or rigidity apperciated  Sensory: Sensation is intact to light touch all 4 extremities except diminished anterior surface left lower extremity which is chronic and unchanged per patient  coordination/Movement: no tremor  noted, no dysmetria, on finger-to-nose    The results of significant diagnostics from this hospitalization (including imaging, microbiology, ancillary and laboratory) are listed below for reference.     Microbiology: Recent Results (from the past 240 hours)  MRSA Next Gen by PCR, Nasal     Status: None   Collection Time: 08/15/23  7:05 PM   Specimen: Nasal Mucosa; Nasal Swab  Result Value Ref Range Status   MRSA by PCR Next Gen NOT DETECTED NOT DETECTED Final    Comment: (NOTE) The GeneXpert MRSA Assay (FDA approved for NASAL specimens only), is one component of a comprehensive MRSA colonization surveillance program. It is not intended to diagnose MRSA infection nor to guide or monitor treatment for MRSA infections. Test performance is not FDA approved in patients less than 67 years old. Performed at [facility], [address] [place], [place] [address]  Labs: BNP (last 3 results) No results for input(s): "BNP" in the last 8760 hours. Basic Metabolic Panel: Recent Labs  Lab 08/15/23 1039 08/15/23 1041 08/16/23 0527 08/17/23 0734  NA 138 139 137 138  K 4.5 4.4 4.3 4.0  CL 107 109 106 106  CO2 20*  --  23 21*  GLUCOSE 222* 223* 137* 147*  BUN 32* 35* 31* 27*  CREATININE 1.96* 2.10* 2.03* 1.79*  CALCIUM  8.9  --  8.8* 9.2   Liver Function Tests: Recent Labs  Lab 08/15/23 1039 08/16/23 0527  AST 20 21  ALT 14 18  ALKPHOS 79 80  BILITOT 0.9 0.6  PROT 7.1 6.4*  ALBUMIN 3.4* 3.0*   No results for input(s): "LIPASE", "AMYLASE" in the last 168 hours. No results for input(s): "AMMONIA" in the last 168 hours. CBC: Recent Labs  Lab 08/15/23 1039 08/15/23 1041 08/16/23 0527 08/17/23 0734  WBC 10.3  --  10.4 9.2  NEUTROABS 6.9  --   --   --   HGB 13.5 14.3 12.4 13.8  HCT 42.0 42.0 39.0 42.1  MCV 96.3  --  97.3 94.2  PLT 280  --  251 259   Cardiac Enzymes: No results for input(s): "CKTOTAL", "CKMB", "CKMBINDEX", "TROPONINI" in the last 168  hours. BNP: Invalid input(s): "POCBNP" CBG: Recent Labs  Lab 08/15/23 1037 08/16/23 1153 08/16/23 1756 08/16/23 2142 08/17/23 0708  GLUCAP 209* 272* 160* 204* 154*   D-Dimer No results for input(s): "DDIMER" in the last 72 hours. Hgb A1c Recent Labs    08/15/23 1944  HGBA1C 7.1*   Lipid Profile Recent Labs    08/16/23 0527  CHOL 148  HDL 51  LDLCALC 79  TRIG 90  CHOLHDL 2.9   Thyroid  function studies No results for input(s): "TSH", "T4TOTAL", "T3FREE", "THYROIDAB" in the last 72 hours.  Invalid input(s): "FREET3" Anemia work up No results for input(s): "VITAMINB12", "FOLATE", "FERRITIN", "TIBC", "IRON", "RETICCTPCT" in the last 72 hours. Urinalysis    Component Value Date/Time   COLORURINE YELLOW 11/25/2020 1304   APPEARANCEUR HAZY (A) 11/25/2020 1304   LABSPEC 1.016 11/25/2020 1304   PHURINE 5.0 11/25/2020 1304   GLUCOSEU NEGATIVE 11/25/2020 1304   HGBUR NEGATIVE 11/25/2020 1304   BILIRUBINUR NEGATIVE 11/25/2020 1304   KETONESUR NEGATIVE 11/25/2020 1304   PROTEINUR 30 (A) 11/25/2020 1304   NITRITE NEGATIVE 11/25/2020 1304   LEUKOCYTESUR MODERATE (A) 11/25/2020 1304   Sepsis Labs Recent Labs  Lab 08/15/23 1039 08/16/23 0527 08/17/23 0734  WBC 10.3 10.4 9.2   Microbiology Recent Results (from the past 240 hours)  MRSA Next Gen by PCR, Nasal     Status: None   Collection Time: 08/15/23  7:05 PM   Specimen: Nasal Mucosa; Nasal Swab  Result Value Ref Range Status   MRSA by PCR Next Gen NOT DETECTED NOT DETECTED Final    Comment: (NOTE) The GeneXpert MRSA Assay (FDA approved for NASAL specimens only), is one component of a comprehensive MRSA colonization surveillance program. It is not intended to diagnose MRSA infection nor to guide or monitor treatment for MRSA infections. Test performance is not FDA approved in patients less than 39 years old. Performed at [facility], [address] [place], [place] [address]      Time coordinating  discharge: Over 30 minutes  SIGNED:   Rema Care [place], DO  [facility] 08/17/2023, 10:39 AM

## 2023-08-17 NOTE — Progress Notes (Signed)
 Pt being transferred to [facility] facility with [facility]. Report giving to admitting facility. All belongings returned to pt, education complete, AVS, chart, and DNR forms given to PT[facility]. Ivs removed.

## 2023-08-20 ENCOUNTER — Other Ambulatory Visit: Payer: Self-pay | Admitting: *Deleted

## 2023-08-20 DIAGNOSIS — I639 Cerebral infarction, unspecified: Secondary | ICD-10-CM | POA: Diagnosis not present

## 2023-08-20 DIAGNOSIS — I4891 Unspecified atrial fibrillation: Secondary | ICD-10-CM | POA: Diagnosis not present

## 2023-08-20 DIAGNOSIS — I10 Essential (primary) hypertension: Secondary | ICD-10-CM | POA: Diagnosis not present

## 2023-08-20 DIAGNOSIS — R001 Bradycardia, unspecified: Secondary | ICD-10-CM | POA: Diagnosis not present

## 2023-08-20 DIAGNOSIS — M6281 Muscle weakness (generalized): Secondary | ICD-10-CM | POA: Diagnosis not present

## 2023-08-20 DIAGNOSIS — R2689 Other abnormalities of gait and mobility: Secondary | ICD-10-CM | POA: Diagnosis not present

## 2023-08-20 DIAGNOSIS — I480 Paroxysmal atrial fibrillation: Secondary | ICD-10-CM | POA: Diagnosis not present

## 2023-08-20 DIAGNOSIS — E039 Hypothyroidism, unspecified: Secondary | ICD-10-CM | POA: Diagnosis not present

## 2023-08-20 DIAGNOSIS — R5381 Other malaise: Secondary | ICD-10-CM | POA: Diagnosis not present

## 2023-08-20 NOTE — Patient Outreach (Signed)
 Mrs. Summer Hawkins admitted to [facility] under [facility] SN[facility] waiver. Screening for potential complex care management needs as benefit of health plan and PCP.   Secure communication sent to [facility] social worker to make aware Clinical research associate is following.  Nolberto Batty, MSN, RN, BSN [place]  [facility], [facility] RN Post- Acute Care Manager Direct Dial : [phone]

## 2023-08-21 DIAGNOSIS — I4891 Unspecified atrial fibrillation: Secondary | ICD-10-CM | POA: Diagnosis not present

## 2023-08-21 DIAGNOSIS — E039 Hypothyroidism, unspecified: Secondary | ICD-10-CM | POA: Diagnosis not present

## 2023-08-21 DIAGNOSIS — R001 Bradycardia, unspecified: Secondary | ICD-10-CM | POA: Diagnosis not present

## 2023-08-21 DIAGNOSIS — I639 Cerebral infarction, unspecified: Secondary | ICD-10-CM | POA: Diagnosis not present

## 2023-08-21 DIAGNOSIS — R5381 Other malaise: Secondary | ICD-10-CM | POA: Diagnosis not present

## 2023-08-21 DIAGNOSIS — I10 Essential (primary) hypertension: Secondary | ICD-10-CM | POA: Diagnosis not present

## 2023-08-22 ENCOUNTER — Encounter: Payer: Self-pay | Admitting: *Deleted

## 2023-08-22 DIAGNOSIS — E039 Hypothyroidism, unspecified: Secondary | ICD-10-CM | POA: Diagnosis not present

## 2023-08-22 DIAGNOSIS — R5381 Other malaise: Secondary | ICD-10-CM | POA: Diagnosis not present

## 2023-08-22 DIAGNOSIS — I10 Essential (primary) hypertension: Secondary | ICD-10-CM | POA: Diagnosis not present

## 2023-08-22 DIAGNOSIS — R001 Bradycardia, unspecified: Secondary | ICD-10-CM | POA: Diagnosis not present

## 2023-08-22 DIAGNOSIS — I639 Cerebral infarction, unspecified: Secondary | ICD-10-CM | POA: Diagnosis not present

## 2023-08-22 DIAGNOSIS — I4891 Unspecified atrial fibrillation: Secondary | ICD-10-CM | POA: Diagnosis not present

## 2023-08-22 NOTE — Congregational Nurse Program (Signed)
 Went to visit Summer Hawkins at [facility].  She was in good spirits and had just finished walking with physical therapy.   Lying bed relaxed.   Speech is clear and she can move all four extremities . Alert and fully reactive.  She states that she is scheduled to go home on Wednesday [id number] she/her/hers has home care set up and is cheerful about going home.  We did discuss Summer Hawkins getting medical alert necklace and leave it on at times when she is alone. The house is one level ranch style house.  Doors are big enough for the use of a wheelchair. Scatter rugs been removed and the house safe.  She also has the church to help transport and to check in on.

## 2023-08-23 DIAGNOSIS — I5032 Chronic diastolic (congestive) heart failure: Secondary | ICD-10-CM | POA: Diagnosis not present

## 2023-08-23 DIAGNOSIS — Z8673 Personal history of transient ischemic attack (TIA), and cerebral infarction without residual deficits: Secondary | ICD-10-CM | POA: Diagnosis not present

## 2023-08-23 DIAGNOSIS — I130 Hypertensive heart and chronic kidney disease with heart failure and stage 1 through stage 4 chronic kidney disease, or unspecified chronic kidney disease: Secondary | ICD-10-CM | POA: Diagnosis not present

## 2023-08-23 DIAGNOSIS — E538 Deficiency of other specified B group vitamins: Secondary | ICD-10-CM | POA: Diagnosis not present

## 2023-08-23 DIAGNOSIS — E039 Hypothyroidism, unspecified: Secondary | ICD-10-CM | POA: Diagnosis not present

## 2023-08-23 DIAGNOSIS — N183 Chronic kidney disease, stage 3 unspecified: Secondary | ICD-10-CM | POA: Diagnosis not present

## 2023-08-23 DIAGNOSIS — E1122 Type 2 diabetes mellitus with diabetic chronic kidney disease: Secondary | ICD-10-CM | POA: Diagnosis not present

## 2023-08-23 DIAGNOSIS — T8189XD Other complications of procedures, not elsewhere classified, subsequent encounter: Secondary | ICD-10-CM | POA: Diagnosis not present

## 2023-08-23 DIAGNOSIS — Z7984 Long term (current) use of oral hypoglycemic drugs: Secondary | ICD-10-CM | POA: Diagnosis not present

## 2023-08-23 DIAGNOSIS — I480 Paroxysmal atrial fibrillation: Secondary | ICD-10-CM | POA: Diagnosis not present

## 2023-08-23 DIAGNOSIS — Z6834 Body mass index (BMI) 34.0-34.9, adult: Secondary | ICD-10-CM | POA: Diagnosis not present

## 2023-08-27 ENCOUNTER — Ambulatory Visit (INDEPENDENT_AMBULATORY_CARE_PROVIDER_SITE_OTHER): Payer: Medicare Other

## 2023-08-27 ENCOUNTER — Other Ambulatory Visit: Payer: Self-pay | Admitting: *Deleted

## 2023-08-27 DIAGNOSIS — N183 Chronic kidney disease, stage 3 unspecified: Secondary | ICD-10-CM | POA: Diagnosis not present

## 2023-08-27 DIAGNOSIS — I5032 Chronic diastolic (congestive) heart failure: Secondary | ICD-10-CM | POA: Diagnosis not present

## 2023-08-27 DIAGNOSIS — E1122 Type 2 diabetes mellitus with diabetic chronic kidney disease: Secondary | ICD-10-CM | POA: Diagnosis not present

## 2023-08-27 DIAGNOSIS — I480 Paroxysmal atrial fibrillation: Secondary | ICD-10-CM | POA: Diagnosis not present

## 2023-08-27 DIAGNOSIS — I130 Hypertensive heart and chronic kidney disease with heart failure and stage 1 through stage 4 chronic kidney disease, or unspecified chronic kidney disease: Secondary | ICD-10-CM | POA: Diagnosis not present

## 2023-08-27 DIAGNOSIS — I4821 Permanent atrial fibrillation: Secondary | ICD-10-CM | POA: Diagnosis not present

## 2023-08-27 DIAGNOSIS — E039 Hypothyroidism, unspecified: Secondary | ICD-10-CM | POA: Diagnosis not present

## 2023-08-27 LAB — CUP PACEART REMOTE DEVICE CHECK
Battery Remaining Longevity: 99 mo
Battery Voltage: 2.98 V
Brady Statistic AP VP Percent: 0.1 %
Brady Statistic AP VS Percent: 99.84 %
Brady Statistic AS VP Percent: 0 %
Brady Statistic AS VS Percent: 0.06 %
Brady Statistic RA Percent Paced: 99.95 %
Brady Statistic RV Percent Paced: 0.1 %
Date Time Interrogation Session: 20250615195800
Implantable Lead Connection Status: 753985
Implantable Lead Connection Status: 753985
Implantable Lead Implant Date: 20200921
Implantable Lead Implant Date: 20200921
Implantable Lead Location: 753858
Implantable Lead Location: 753860
Implantable Lead Model: 3830
Implantable Lead Model: 5076
Implantable Pulse Generator Implant Date: 20200921
Lead Channel Impedance Value: 361 Ohm
Lead Channel Impedance Value: 399 Ohm
Lead Channel Impedance Value: 418 Ohm
Lead Channel Impedance Value: 532 Ohm
Lead Channel Sensing Intrinsic Amplitude: 11.5 mV
Lead Channel Sensing Intrinsic Amplitude: 11.625 mV
Lead Channel Sensing Intrinsic Amplitude: 11.625 mV
Lead Channel Sensing Intrinsic Amplitude: 17.25 mV
Lead Channel Setting Pacing Amplitude: 2 V
Lead Channel Setting Pacing Amplitude: 2.5 V
Lead Channel Setting Pacing Pulse Width: 0.4 ms
Lead Channel Setting Sensing Sensitivity: 1.2 mV
Zone Setting Status: 755011
Zone Setting Status: 755011

## 2023-08-27 NOTE — Patient Outreach (Signed)
 Post-Acute Care Manager follow up. Verified in [facility] Mrs. Warth discharged from [facility] on 08/22/23. [facility] SN[facility] waiver was utilized for admission.    [facility] was arranged. 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   No identifiable VBCI complex care management needs.   Nolberto Batty, MSN, RN, BSN [place]  [facility], [facility] RN Post- Acute Care Manager Direct Dial : [phone]

## 2023-08-30 ENCOUNTER — Ambulatory Visit: Payer: Self-pay | Admitting: Internal Medicine

## 2023-08-30 DIAGNOSIS — I5032 Chronic diastolic (congestive) heart failure: Secondary | ICD-10-CM | POA: Diagnosis not present

## 2023-08-30 DIAGNOSIS — I4821 Permanent atrial fibrillation: Secondary | ICD-10-CM | POA: Diagnosis not present

## 2023-08-30 DIAGNOSIS — E1122 Type 2 diabetes mellitus with diabetic chronic kidney disease: Secondary | ICD-10-CM | POA: Diagnosis not present

## 2023-08-30 DIAGNOSIS — N1832 Chronic kidney disease, stage 3b: Secondary | ICD-10-CM | POA: Diagnosis not present

## 2023-08-30 DIAGNOSIS — N183 Chronic kidney disease, stage 3 unspecified: Secondary | ICD-10-CM | POA: Diagnosis not present

## 2023-08-30 DIAGNOSIS — Z950 Presence of cardiac pacemaker: Secondary | ICD-10-CM | POA: Diagnosis not present

## 2023-08-30 DIAGNOSIS — E039 Hypothyroidism, unspecified: Secondary | ICD-10-CM | POA: Diagnosis not present

## 2023-08-30 DIAGNOSIS — R001 Bradycardia, unspecified: Secondary | ICD-10-CM | POA: Diagnosis not present

## 2023-08-30 DIAGNOSIS — Z794 Long term (current) use of insulin: Secondary | ICD-10-CM | POA: Diagnosis not present

## 2023-08-30 DIAGNOSIS — E113591 Type 2 diabetes mellitus with proliferative diabetic retinopathy without macular edema, right eye: Secondary | ICD-10-CM | POA: Diagnosis not present

## 2023-08-30 DIAGNOSIS — I509 Heart failure, unspecified: Secondary | ICD-10-CM | POA: Diagnosis not present

## 2023-08-30 DIAGNOSIS — Z7901 Long term (current) use of anticoagulants: Secondary | ICD-10-CM | POA: Diagnosis not present

## 2023-08-30 DIAGNOSIS — I480 Paroxysmal atrial fibrillation: Secondary | ICD-10-CM | POA: Diagnosis not present

## 2023-08-30 DIAGNOSIS — I130 Hypertensive heart and chronic kidney disease with heart failure and stage 1 through stage 4 chronic kidney disease, or unspecified chronic kidney disease: Secondary | ICD-10-CM | POA: Diagnosis not present

## 2023-08-30 DIAGNOSIS — Z8673 Personal history of transient ischemic attack (TIA), and cerebral infarction without residual deficits: Secondary | ICD-10-CM | POA: Diagnosis not present

## 2023-09-05 DIAGNOSIS — H35372 Puckering of macula, left eye: Secondary | ICD-10-CM | POA: Diagnosis not present

## 2023-09-05 DIAGNOSIS — H43813 Vitreous degeneration, bilateral: Secondary | ICD-10-CM | POA: Diagnosis not present

## 2023-09-05 DIAGNOSIS — E113393 Type 2 diabetes mellitus with moderate nonproliferative diabetic retinopathy without macular edema, bilateral: Secondary | ICD-10-CM | POA: Diagnosis not present

## 2023-09-05 DIAGNOSIS — H353123 Nonexudative age-related macular degeneration, left eye, advanced atrophic without subfoveal involvement: Secondary | ICD-10-CM | POA: Diagnosis not present

## 2023-09-05 DIAGNOSIS — H401133 Primary open-angle glaucoma, bilateral, severe stage: Secondary | ICD-10-CM | POA: Diagnosis not present

## 2023-09-05 DIAGNOSIS — H353113 Nonexudative age-related macular degeneration, right eye, advanced atrophic without subfoveal involvement: Secondary | ICD-10-CM | POA: Diagnosis not present

## 2023-09-06 DIAGNOSIS — I130 Hypertensive heart and chronic kidney disease with heart failure and stage 1 through stage 4 chronic kidney disease, or unspecified chronic kidney disease: Secondary | ICD-10-CM | POA: Diagnosis not present

## 2023-09-06 DIAGNOSIS — I480 Paroxysmal atrial fibrillation: Secondary | ICD-10-CM | POA: Diagnosis not present

## 2023-09-06 DIAGNOSIS — E039 Hypothyroidism, unspecified: Secondary | ICD-10-CM | POA: Diagnosis not present

## 2023-09-06 DIAGNOSIS — N183 Chronic kidney disease, stage 3 unspecified: Secondary | ICD-10-CM | POA: Diagnosis not present

## 2023-09-06 DIAGNOSIS — I5032 Chronic diastolic (congestive) heart failure: Secondary | ICD-10-CM | POA: Diagnosis not present

## 2023-09-06 DIAGNOSIS — E1122 Type 2 diabetes mellitus with diabetic chronic kidney disease: Secondary | ICD-10-CM | POA: Diagnosis not present

## 2023-09-12 DIAGNOSIS — E039 Hypothyroidism, unspecified: Secondary | ICD-10-CM | POA: Diagnosis not present

## 2023-09-12 DIAGNOSIS — N183 Chronic kidney disease, stage 3 unspecified: Secondary | ICD-10-CM | POA: Diagnosis not present

## 2023-09-12 DIAGNOSIS — I5032 Chronic diastolic (congestive) heart failure: Secondary | ICD-10-CM | POA: Diagnosis not present

## 2023-09-12 DIAGNOSIS — E1122 Type 2 diabetes mellitus with diabetic chronic kidney disease: Secondary | ICD-10-CM | POA: Diagnosis not present

## 2023-09-12 DIAGNOSIS — I480 Paroxysmal atrial fibrillation: Secondary | ICD-10-CM | POA: Diagnosis not present

## 2023-09-12 DIAGNOSIS — I130 Hypertensive heart and chronic kidney disease with heart failure and stage 1 through stage 4 chronic kidney disease, or unspecified chronic kidney disease: Secondary | ICD-10-CM | POA: Diagnosis not present

## 2023-09-14 ENCOUNTER — Other Ambulatory Visit: Payer: Self-pay | Admitting: Cardiovascular Disease

## 2023-09-19 DIAGNOSIS — I5032 Chronic diastolic (congestive) heart failure: Secondary | ICD-10-CM | POA: Diagnosis not present

## 2023-09-19 DIAGNOSIS — E039 Hypothyroidism, unspecified: Secondary | ICD-10-CM | POA: Diagnosis not present

## 2023-09-19 DIAGNOSIS — I130 Hypertensive heart and chronic kidney disease with heart failure and stage 1 through stage 4 chronic kidney disease, or unspecified chronic kidney disease: Secondary | ICD-10-CM | POA: Diagnosis not present

## 2023-09-19 DIAGNOSIS — E1122 Type 2 diabetes mellitus with diabetic chronic kidney disease: Secondary | ICD-10-CM | POA: Diagnosis not present

## 2023-09-19 DIAGNOSIS — I480 Paroxysmal atrial fibrillation: Secondary | ICD-10-CM | POA: Diagnosis not present

## 2023-09-19 DIAGNOSIS — N183 Chronic kidney disease, stage 3 unspecified: Secondary | ICD-10-CM | POA: Diagnosis not present

## 2023-09-22 DIAGNOSIS — E538 Deficiency of other specified B group vitamins: Secondary | ICD-10-CM | POA: Diagnosis not present

## 2023-09-22 DIAGNOSIS — Z8673 Personal history of transient ischemic attack (TIA), and cerebral infarction without residual deficits: Secondary | ICD-10-CM | POA: Diagnosis not present

## 2023-09-22 DIAGNOSIS — I130 Hypertensive heart and chronic kidney disease with heart failure and stage 1 through stage 4 chronic kidney disease, or unspecified chronic kidney disease: Secondary | ICD-10-CM | POA: Diagnosis not present

## 2023-09-22 DIAGNOSIS — E1122 Type 2 diabetes mellitus with diabetic chronic kidney disease: Secondary | ICD-10-CM | POA: Diagnosis not present

## 2023-09-22 DIAGNOSIS — Z6834 Body mass index (BMI) 34.0-34.9, adult: Secondary | ICD-10-CM | POA: Diagnosis not present

## 2023-09-22 DIAGNOSIS — N183 Chronic kidney disease, stage 3 unspecified: Secondary | ICD-10-CM | POA: Diagnosis not present

## 2023-09-22 DIAGNOSIS — I5032 Chronic diastolic (congestive) heart failure: Secondary | ICD-10-CM | POA: Diagnosis not present

## 2023-09-22 DIAGNOSIS — E039 Hypothyroidism, unspecified: Secondary | ICD-10-CM | POA: Diagnosis not present

## 2023-09-22 DIAGNOSIS — T8189XD Other complications of procedures, not elsewhere classified, subsequent encounter: Secondary | ICD-10-CM | POA: Diagnosis not present

## 2023-09-22 DIAGNOSIS — Z7984 Long term (current) use of oral hypoglycemic drugs: Secondary | ICD-10-CM | POA: Diagnosis not present

## 2023-09-22 DIAGNOSIS — I480 Paroxysmal atrial fibrillation: Secondary | ICD-10-CM | POA: Diagnosis not present

## 2023-09-27 DIAGNOSIS — E1122 Type 2 diabetes mellitus with diabetic chronic kidney disease: Secondary | ICD-10-CM | POA: Diagnosis not present

## 2023-09-27 DIAGNOSIS — I130 Hypertensive heart and chronic kidney disease with heart failure and stage 1 through stage 4 chronic kidney disease, or unspecified chronic kidney disease: Secondary | ICD-10-CM | POA: Diagnosis not present

## 2023-09-27 DIAGNOSIS — I5032 Chronic diastolic (congestive) heart failure: Secondary | ICD-10-CM | POA: Diagnosis not present

## 2023-09-27 DIAGNOSIS — E039 Hypothyroidism, unspecified: Secondary | ICD-10-CM | POA: Diagnosis not present

## 2023-09-27 DIAGNOSIS — I480 Paroxysmal atrial fibrillation: Secondary | ICD-10-CM | POA: Diagnosis not present

## 2023-09-27 DIAGNOSIS — N183 Chronic kidney disease, stage 3 unspecified: Secondary | ICD-10-CM | POA: Diagnosis not present

## 2023-09-28 DIAGNOSIS — H401131 Primary open-angle glaucoma, bilateral, mild stage: Secondary | ICD-10-CM | POA: Diagnosis not present

## 2023-09-28 DIAGNOSIS — H353133 Nonexudative age-related macular degeneration, bilateral, advanced atrophic without subfoveal involvement: Secondary | ICD-10-CM | POA: Diagnosis not present

## 2023-09-28 DIAGNOSIS — Z961 Presence of intraocular lens: Secondary | ICD-10-CM | POA: Diagnosis not present

## 2023-09-28 DIAGNOSIS — H5340 Unspecified visual field defects: Secondary | ICD-10-CM | POA: Diagnosis not present

## 2023-10-01 DIAGNOSIS — I130 Hypertensive heart and chronic kidney disease with heart failure and stage 1 through stage 4 chronic kidney disease, or unspecified chronic kidney disease: Secondary | ICD-10-CM | POA: Diagnosis not present

## 2023-10-01 DIAGNOSIS — E039 Hypothyroidism, unspecified: Secondary | ICD-10-CM | POA: Diagnosis not present

## 2023-10-01 DIAGNOSIS — T8189XD Other complications of procedures, not elsewhere classified, subsequent encounter: Secondary | ICD-10-CM | POA: Diagnosis not present

## 2023-10-01 DIAGNOSIS — E538 Deficiency of other specified B group vitamins: Secondary | ICD-10-CM | POA: Diagnosis not present

## 2023-10-01 DIAGNOSIS — Z6834 Body mass index (BMI) 34.0-34.9, adult: Secondary | ICD-10-CM | POA: Diagnosis not present

## 2023-10-01 DIAGNOSIS — N1832 Chronic kidney disease, stage 3b: Secondary | ICD-10-CM | POA: Diagnosis not present

## 2023-10-01 DIAGNOSIS — Z8673 Personal history of transient ischemic attack (TIA), and cerebral infarction without residual deficits: Secondary | ICD-10-CM | POA: Diagnosis not present

## 2023-10-01 DIAGNOSIS — Z7984 Long term (current) use of oral hypoglycemic drugs: Secondary | ICD-10-CM | POA: Diagnosis not present

## 2023-10-01 DIAGNOSIS — I480 Paroxysmal atrial fibrillation: Secondary | ICD-10-CM | POA: Diagnosis not present

## 2023-10-01 DIAGNOSIS — E1122 Type 2 diabetes mellitus with diabetic chronic kidney disease: Secondary | ICD-10-CM | POA: Diagnosis not present

## 2023-10-01 DIAGNOSIS — I5032 Chronic diastolic (congestive) heart failure: Secondary | ICD-10-CM | POA: Diagnosis not present

## 2023-10-04 DIAGNOSIS — I480 Paroxysmal atrial fibrillation: Secondary | ICD-10-CM | POA: Diagnosis not present

## 2023-10-04 DIAGNOSIS — E039 Hypothyroidism, unspecified: Secondary | ICD-10-CM | POA: Diagnosis not present

## 2023-10-04 DIAGNOSIS — I130 Hypertensive heart and chronic kidney disease with heart failure and stage 1 through stage 4 chronic kidney disease, or unspecified chronic kidney disease: Secondary | ICD-10-CM | POA: Diagnosis not present

## 2023-10-04 DIAGNOSIS — E1122 Type 2 diabetes mellitus with diabetic chronic kidney disease: Secondary | ICD-10-CM | POA: Diagnosis not present

## 2023-10-04 DIAGNOSIS — I5032 Chronic diastolic (congestive) heart failure: Secondary | ICD-10-CM | POA: Diagnosis not present

## 2023-10-04 DIAGNOSIS — N183 Chronic kidney disease, stage 3 unspecified: Secondary | ICD-10-CM | POA: Diagnosis not present

## 2023-10-04 NOTE — Progress Notes (Signed)
 Remote pacemaker transmission.

## 2023-10-10 ENCOUNTER — Encounter: Payer: Self-pay | Admitting: Physician Assistant

## 2023-10-10 ENCOUNTER — Ambulatory Visit: Attending: Cardiology | Admitting: Physician Assistant

## 2023-10-10 VITALS — BP 144/82 | HR 56 | Resp 16 | Ht 64.0 in | Wt 205.6 lb

## 2023-10-10 DIAGNOSIS — I4821 Permanent atrial fibrillation: Secondary | ICD-10-CM | POA: Insufficient documentation

## 2023-10-10 DIAGNOSIS — Z950 Presence of cardiac pacemaker: Secondary | ICD-10-CM | POA: Diagnosis not present

## 2023-10-10 DIAGNOSIS — R001 Bradycardia, unspecified: Secondary | ICD-10-CM | POA: Insufficient documentation

## 2023-10-10 DIAGNOSIS — I5032 Chronic diastolic (congestive) heart failure: Secondary | ICD-10-CM | POA: Insufficient documentation

## 2023-10-10 DIAGNOSIS — I10 Essential (primary) hypertension: Secondary | ICD-10-CM | POA: Insufficient documentation

## 2023-10-10 MED ORDER — IRBESARTAN 300 MG PO TABS: 300.0000 mg | ORAL_TABLET | Freq: Every day | ORAL | 3 refills | Status: AC

## 2023-10-10 MED ORDER — METOPROLOL SUCCINATE ER 25 MG PO TB24: 25.0000 mg | ORAL_TABLET | Freq: Every day | ORAL | 3 refills | Status: DC

## 2023-10-10 MED ORDER — APIXABAN 2.5 MG PO TABS: 2.5000 mg | ORAL_TABLET | Freq: Two times a day (BID) | ORAL | 5 refills | Status: DC

## 2023-10-10 MED ORDER — AMLODIPINE BESYLATE 5 MG PO TABS: 5.0000 mg | ORAL_TABLET | Freq: Every morning | ORAL | 3 refills | Status: DC

## 2023-10-10 MED ORDER — METOPROLOL SUCCINATE ER 25 MG PO TB24: 25.0000 mg | ORAL_TABLET | Freq: Every day | ORAL | 3 refills | Status: AC

## 2023-10-10 MED ORDER — IRBESARTAN 300 MG PO TABS: 300.0000 mg | ORAL_TABLET | Freq: Every day | ORAL | 3 refills | Status: DC

## 2023-10-10 MED ORDER — ATORVASTATIN CALCIUM 20 MG PO TABS: 20.0000 mg | ORAL_TABLET | Freq: Every day | ORAL | Status: AC

## 2023-10-10 MED ORDER — ATORVASTATIN CALCIUM 20 MG PO TABS: 20.0000 mg | ORAL_TABLET | Freq: Every day | ORAL | Status: DC

## 2023-10-10 NOTE — Progress Notes (Signed)
 Cardiology Office Note   Date:  10/10/2023  ID:  Summer, Hawkins 15-Oct-1934, MRN [id number] PCP: Vernadine Charlie ORN, MD  [facility] Providers Cardiologist:  Lonni Cash, MD Electrophysiologist:  Danelle Birmingham, MD   History of Present Illness Summer Hawkins is a 89 y.o. female with a past medical history of CHB status post PPM, syncope, chronic atrial fibrillation, chronic kidney disease, diabetes mellitus, hypertension, hyperlipidemia here for follow-up appointment.  Discussed the use of AI scribe software for clinical note transcription with the patient, who gave verbal consent to proceed.  History of Present Illness   Summer Hawkins is an 89 year old female with a history of stroke who presents for cardiovascular follow-up.  Three weeks ago, she experienced a stroke with facial droop and aphasia, but has no residual deficits. She was hospitalized promptly and has since been prescribed atorvastatin , which she has not started due to her lipid levels. Her current medications include metoprolol , Avapro , Eliquis , and amlodipine . She also takes Synthroid  for thyroid  function.  A pacemaker was placed following syncope due to bradycardia, improving her energy levels. She has a history of atrial fibrillation, which predates the pacemaker insertion.  Her kidney function is slightly above normal but has improved since hospitalization. She monitors sodium intake to manage swelling. She engages in regular physical exercises at home, including using barbells and leg strengthening exercises.  Reports no shortness of breath nor dyspnea on exertion. Reports no chest pain, pressure, or tightness. No edema, orthopnea, PND. Reports no palpitations.    ROS: pertinent ROS in HPI  Studies Reviewed     Echo 08/26/23 IMPRESSIONS     1. Left ventricular ejection fraction, by estimation, is 60 to 65%. The  left ventricle has normal function. The left ventricle has no regional  wall  motion abnormalities. There is mild left ventricular hypertrophy.  Left ventricular diastolic parameters  were normal.   2. Right ventricular systolic function is mildly reduced. The right  ventricular size is mildly enlarged. There is normal pulmonary artery  systolic pressure. The estimated right ventricular systolic pressure is  28.2 mmHg.   3. Left atrial size was moderately dilated.   4. The mitral valve is normal in structure. No evidence of mitral valve  regurgitation. No evidence of mitral stenosis.   5. The aortic valve is normal in structure. Aortic valve regurgitation is  not visualized. No aortic stenosis is present.   6. The inferior vena cava is normal in size with greater than 50%  respiratory variability, suggesting right atrial pressure of 3 mmHg.   FINDINGS   Left Ventricle: Left ventricular ejection fraction, by estimation, is 60  to 65%. The left ventricle has normal function. The left ventricle has no  regional wall motion abnormalities. Definity  contrast agent was given IV  to delineate the left ventricular   endocardial borders. The left ventricular internal cavity size was normal  in size. There is mild left ventricular hypertrophy. Left ventricular  diastolic parameters were normal.   Right Ventricle: The right ventricular size is mildly enlarged. No  increase in right ventricular wall thickness. Right ventricular systolic  function is mildly reduced. There is normal pulmonary artery systolic  pressure. The tricuspid regurgitant velocity   is 2.51 m/s, and with an assumed right atrial pressure of 3 mmHg, the  estimated right ventricular systolic pressure is 28.2 mmHg.   Left Atrium: Left atrial size was moderately dilated.   Right Atrium: Right atrial size was normal in size.  Pericardium: There is no evidence of pericardial effusion.   Mitral Valve: The mitral valve is normal in structure. Mild mitral annular  calcification. No evidence of mitral valve  regurgitation. No evidence of  mitral valve stenosis.   Tricuspid Valve: The tricuspid valve is normal in structure. Tricuspid  valve regurgitation is mild . No evidence of tricuspid stenosis.   Aortic Valve: The aortic valve is normal in structure. Aortic valve  regurgitation is not visualized. No aortic stenosis is present. Aortic  valve mean gradient measures 3.0 mmHg. Aortic valve peak gradient measures  6.0 mmHg. Aortic valve area, by VTI  measures 0.99 cm.   Pulmonic Valve: The pulmonic valve was normal in structure. Pulmonic valve  regurgitation is not visualized. No evidence of pulmonic stenosis.   Aorta: The aortic root is normal in size and structure.   Venous: The inferior vena cava is normal in size with greater than 50%  respiratory variability, suggesting right atrial pressure of 3 mmHg.   IAS/Shunts: No atrial level shunt detected by color flow Doppler.   Additional Comments: A device lead is visualized in the right ventricle.  Risk Assessment/Calculations  CHA2DS2-VASc Score = 5   This indicates a 7.2% annual risk of stroke. The patient's score is based upon: CHF History: 0 HTN History: 1 Diabetes History: 1 Stroke History: 0 Vascular Disease History: 0 Age Score: 2 Gender Score: 1      Physical Exam VS:  BP (!) 144/82 (BP Location: Left Arm, Patient Position: Sitting, Cuff Size: Large)   Pulse (!) 56   Resp 16   Ht 5' 4 (1.626 m)   Wt 205 lb 9.6 oz (93.3 kg)   LMP  (LMP Unknown)   SpO2 92%   BMI 35.29 kg/m        Wt Readings from Last 3 Encounters:  10/10/23 205 lb 9.6 oz (93.3 kg)  08/15/23 205 lb 7.5 oz (93.2 kg)  01/15/23 204 lb (92.5 kg)    GEN: Well nourished, well developed in no acute distress NECK: No JVD; No carotid bruits CARDIAC: RRR, no murmurs, rubs, gallops RESPIRATORY:  Clear to auscultation without rales, wheezing or rhonchi  ABDOMEN: Soft, non-tender, non-distended EXTREMITIES:  No edema; No deformity   ASSESSMENT AND  PLAN  Recent cerebral infarction (stroke) Recent stroke with no residual deficits. Stroke origin unclear. -continue PT/OT exercises at home, strength preserved  - Monitor lipid profile with primary care physician. - No atorvastatin  needed based on current lipid levels.  Atrial fibrillation with cardiac pacemaker Atrial fibrillation managed with pacemaker. Improved energy levels and heart rate control. No device issues. -She is in NSR today  Chronic kidney disease stage 4 Stage 4 CKD with improved creatinine level to 1.79.  Hypothyroidism on replacement therapy Hypothyroidism managed with Synthroid . No issues reported.   Diastolic CHF Euvolemic on exam today Continue current medications and daily weights are recommended Continue PT/OT exercises at home      Dispo: She can follow-up in a year with Dr. Verlin, Dr. Waddell in 3 months  Signed, Orren LOISE Fabry, PA-C

## 2023-10-10 NOTE — Patient Instructions (Signed)
 Medication Instructions:  NO CHANGES *If you need a refill on your cardiac medications before your next appointment, please call your pharmacy*  Lab Work: NO LABS If you have labs (blood work) drawn today and your tests are completely normal, you will receive your results only by: MyChart Message (if you have MyChart) OR A paper copy in the mail If you have any lab test that is abnormal or we need to change your treatment, we will call you to review the results.  Testing/Procedures: NO TESTING  Follow-Up: At [facility], you and your health needs are our priority.  As part of our continuing mission to provide you with exceptional heart care, our providers are all part of one team.  This team includes your primary Cardiologist (physician) and Advanced Practice Providers or APPs (Physician Assistants and Nurse Practitioners) who all work together to provide you with the care you need, when you need it.  Your next appointment:   1 year(s)  Provider:   Antoinette Batman, MD

## 2023-10-11 DIAGNOSIS — I130 Hypertensive heart and chronic kidney disease with heart failure and stage 1 through stage 4 chronic kidney disease, or unspecified chronic kidney disease: Secondary | ICD-10-CM | POA: Diagnosis not present

## 2023-10-11 DIAGNOSIS — I480 Paroxysmal atrial fibrillation: Secondary | ICD-10-CM | POA: Diagnosis not present

## 2023-10-11 DIAGNOSIS — N183 Chronic kidney disease, stage 3 unspecified: Secondary | ICD-10-CM | POA: Diagnosis not present

## 2023-10-11 DIAGNOSIS — E039 Hypothyroidism, unspecified: Secondary | ICD-10-CM | POA: Diagnosis not present

## 2023-10-11 DIAGNOSIS — E1122 Type 2 diabetes mellitus with diabetic chronic kidney disease: Secondary | ICD-10-CM | POA: Diagnosis not present

## 2023-10-11 DIAGNOSIS — I5032 Chronic diastolic (congestive) heart failure: Secondary | ICD-10-CM | POA: Diagnosis not present

## 2023-10-17 DIAGNOSIS — H43813 Vitreous degeneration, bilateral: Secondary | ICD-10-CM | POA: Diagnosis not present

## 2023-10-17 DIAGNOSIS — H401133 Primary open-angle glaucoma, bilateral, severe stage: Secondary | ICD-10-CM | POA: Diagnosis not present

## 2023-10-17 DIAGNOSIS — E113393 Type 2 diabetes mellitus with moderate nonproliferative diabetic retinopathy without macular edema, bilateral: Secondary | ICD-10-CM | POA: Diagnosis not present

## 2023-10-17 DIAGNOSIS — H4720 Unspecified optic atrophy: Secondary | ICD-10-CM | POA: Diagnosis not present

## 2023-10-17 DIAGNOSIS — H353113 Nonexudative age-related macular degeneration, right eye, advanced atrophic without subfoveal involvement: Secondary | ICD-10-CM | POA: Diagnosis not present

## 2023-10-17 DIAGNOSIS — H353123 Nonexudative age-related macular degeneration, left eye, advanced atrophic without subfoveal involvement: Secondary | ICD-10-CM | POA: Diagnosis not present

## 2023-10-17 DIAGNOSIS — H35372 Puckering of macula, left eye: Secondary | ICD-10-CM | POA: Diagnosis not present

## 2023-10-18 ENCOUNTER — Other Ambulatory Visit: Payer: Self-pay | Admitting: Internal Medicine

## 2023-10-18 DIAGNOSIS — Z1231 Encounter for screening mammogram for malignant neoplasm of breast: Secondary | ICD-10-CM

## 2023-10-18 DIAGNOSIS — N183 Chronic kidney disease, stage 3 unspecified: Secondary | ICD-10-CM | POA: Diagnosis not present

## 2023-10-18 DIAGNOSIS — E1122 Type 2 diabetes mellitus with diabetic chronic kidney disease: Secondary | ICD-10-CM | POA: Diagnosis not present

## 2023-10-18 DIAGNOSIS — I130 Hypertensive heart and chronic kidney disease with heart failure and stage 1 through stage 4 chronic kidney disease, or unspecified chronic kidney disease: Secondary | ICD-10-CM | POA: Diagnosis not present

## 2023-10-18 DIAGNOSIS — E039 Hypothyroidism, unspecified: Secondary | ICD-10-CM | POA: Diagnosis not present

## 2023-10-18 DIAGNOSIS — I5032 Chronic diastolic (congestive) heart failure: Secondary | ICD-10-CM | POA: Diagnosis not present

## 2023-10-18 DIAGNOSIS — I480 Paroxysmal atrial fibrillation: Secondary | ICD-10-CM | POA: Diagnosis not present

## 2023-11-05 ENCOUNTER — Ambulatory Visit
Admission: RE | Admit: 2023-11-05 | Discharge: 2023-11-05 | Disposition: A | Discharge status: Home / Self Care | Source: Ambulatory Visit | Attending: Internal Medicine | Admitting: Internal Medicine

## 2023-11-05 DIAGNOSIS — Z1231 Encounter for screening mammogram for malignant neoplasm of breast: Secondary | ICD-10-CM

## 2023-11-09 ENCOUNTER — Other Ambulatory Visit: Payer: Self-pay | Admitting: Internal Medicine

## 2023-11-09 DIAGNOSIS — R928 Other abnormal and inconclusive findings on diagnostic imaging of breast: Secondary | ICD-10-CM

## 2023-11-19 ENCOUNTER — Ambulatory Visit
Admission: RE | Admit: 2023-11-19 | Discharge: 2023-11-19 | Disposition: A | Discharge status: Home / Self Care | Source: Ambulatory Visit | Attending: Internal Medicine | Admitting: Internal Medicine

## 2023-11-19 DIAGNOSIS — R928 Other abnormal and inconclusive findings on diagnostic imaging of breast: Secondary | ICD-10-CM

## 2023-11-19 DIAGNOSIS — N6312 Unspecified lump in the right breast, upper inner quadrant: Secondary | ICD-10-CM | POA: Diagnosis not present

## 2023-11-20 ENCOUNTER — Other Ambulatory Visit: Payer: Self-pay | Admitting: Internal Medicine

## 2023-11-20 DIAGNOSIS — N6312 Unspecified lump in the right breast, upper inner quadrant: Secondary | ICD-10-CM

## 2023-11-20 DIAGNOSIS — I130 Hypertensive heart and chronic kidney disease with heart failure and stage 1 through stage 4 chronic kidney disease, or unspecified chronic kidney disease: Secondary | ICD-10-CM | POA: Diagnosis not present

## 2023-11-20 DIAGNOSIS — I4821 Permanent atrial fibrillation: Secondary | ICD-10-CM | POA: Diagnosis not present

## 2023-11-20 DIAGNOSIS — N1832 Chronic kidney disease, stage 3b: Secondary | ICD-10-CM | POA: Diagnosis not present

## 2023-11-20 DIAGNOSIS — Z23 Encounter for immunization: Secondary | ICD-10-CM | POA: Diagnosis not present

## 2023-11-20 DIAGNOSIS — I509 Heart failure, unspecified: Secondary | ICD-10-CM | POA: Diagnosis not present

## 2023-11-20 DIAGNOSIS — Z794 Long term (current) use of insulin: Secondary | ICD-10-CM | POA: Diagnosis not present

## 2023-11-20 DIAGNOSIS — E039 Hypothyroidism, unspecified: Secondary | ICD-10-CM | POA: Diagnosis not present

## 2023-11-20 DIAGNOSIS — E1122 Type 2 diabetes mellitus with diabetic chronic kidney disease: Secondary | ICD-10-CM | POA: Diagnosis not present

## 2023-11-20 DIAGNOSIS — Z950 Presence of cardiac pacemaker: Secondary | ICD-10-CM | POA: Diagnosis not present

## 2023-11-20 DIAGNOSIS — Z8673 Personal history of transient ischemic attack (TIA), and cerebral infarction without residual deficits: Secondary | ICD-10-CM | POA: Diagnosis not present

## 2023-11-20 DIAGNOSIS — Z6835 Body mass index (BMI) 35.0-35.9, adult: Secondary | ICD-10-CM | POA: Diagnosis not present

## 2023-11-20 DIAGNOSIS — E7800 Pure hypercholesterolemia, unspecified: Secondary | ICD-10-CM | POA: Diagnosis not present

## 2023-11-20 DIAGNOSIS — C50412 Malignant neoplasm of upper-outer quadrant of left female breast: Secondary | ICD-10-CM | POA: Diagnosis not present

## 2023-11-21 ENCOUNTER — Ambulatory Visit
Admission: RE | Admit: 2023-11-21 | Discharge: 2023-11-21 | Disposition: A | Discharge status: Home / Self Care | Source: Ambulatory Visit | Attending: Internal Medicine | Admitting: Internal Medicine

## 2023-11-21 DIAGNOSIS — N6312 Unspecified lump in the right breast, upper inner quadrant: Secondary | ICD-10-CM | POA: Diagnosis not present

## 2023-11-21 DIAGNOSIS — C50911 Malignant neoplasm of unspecified site of right female breast: Secondary | ICD-10-CM | POA: Diagnosis not present

## 2023-11-21 DIAGNOSIS — Z170 Estrogen receptor positive status [ER+]: Secondary | ICD-10-CM | POA: Diagnosis not present

## 2023-11-21 HISTORY — PX: BREAST BIOPSY: SHX20

## 2023-11-22 LAB — SURGICAL PATHOLOGY

## 2023-11-26 ENCOUNTER — Ambulatory Visit (INDEPENDENT_AMBULATORY_CARE_PROVIDER_SITE_OTHER): Payer: Medicare Other

## 2023-11-26 DIAGNOSIS — H35372 Puckering of macula, left eye: Secondary | ICD-10-CM | POA: Diagnosis not present

## 2023-11-26 DIAGNOSIS — E113393 Type 2 diabetes mellitus with moderate nonproliferative diabetic retinopathy without macular edema, bilateral: Secondary | ICD-10-CM | POA: Diagnosis not present

## 2023-11-26 DIAGNOSIS — H353113 Nonexudative age-related macular degeneration, right eye, advanced atrophic without subfoveal involvement: Secondary | ICD-10-CM | POA: Diagnosis not present

## 2023-11-26 DIAGNOSIS — H353123 Nonexudative age-related macular degeneration, left eye, advanced atrophic without subfoveal involvement: Secondary | ICD-10-CM | POA: Diagnosis not present

## 2023-11-26 DIAGNOSIS — H401133 Primary open-angle glaucoma, bilateral, severe stage: Secondary | ICD-10-CM | POA: Diagnosis not present

## 2023-11-26 DIAGNOSIS — H43813 Vitreous degeneration, bilateral: Secondary | ICD-10-CM | POA: Diagnosis not present

## 2023-11-26 DIAGNOSIS — I4821 Permanent atrial fibrillation: Secondary | ICD-10-CM | POA: Diagnosis not present

## 2023-11-27 LAB — CUP PACEART REMOTE DEVICE CHECK
Battery Remaining Longevity: 95 mo
Battery Voltage: 2.98 V
Brady Statistic AP VP Percent: 0.05 %
Brady Statistic AP VS Percent: 99.55 %
Brady Statistic AS VP Percent: 0 %
Brady Statistic AS VS Percent: 0.39 %
Brady Statistic RA Percent Paced: 99.6 %
Brady Statistic RV Percent Paced: 0.05 %
Date Time Interrogation Session: 20250914224245
Implantable Lead Connection Status: 753985
Implantable Lead Connection Status: 753985
Implantable Lead Implant Date: 20200921
Implantable Lead Implant Date: 20200921
Implantable Lead Location: 753858
Implantable Lead Location: 753860
Implantable Lead Model: 3830
Implantable Lead Model: 5076
Implantable Pulse Generator Implant Date: 20200921
Lead Channel Impedance Value: 323 Ohm
Lead Channel Impedance Value: 380 Ohm
Lead Channel Impedance Value: 380 Ohm
Lead Channel Impedance Value: 494 Ohm
Lead Channel Sensing Intrinsic Amplitude: 10.5 mV
Lead Channel Sensing Intrinsic Amplitude: 10.5 mV
Lead Channel Sensing Intrinsic Amplitude: 23.5 mV
Lead Channel Sensing Intrinsic Amplitude: 23.5 mV
Lead Channel Setting Pacing Amplitude: 2 V
Lead Channel Setting Pacing Amplitude: 2.5 V
Lead Channel Setting Pacing Pulse Width: 0.4 ms
Lead Channel Setting Sensing Sensitivity: 1.2 mV
Zone Setting Status: 755011
Zone Setting Status: 755011

## 2023-11-28 ENCOUNTER — Ambulatory Visit: Payer: Self-pay | Admitting: General Surgery

## 2023-11-28 DIAGNOSIS — Z170 Estrogen receptor positive status [ER+]: Secondary | ICD-10-CM

## 2023-11-28 DIAGNOSIS — C50211 Malignant neoplasm of upper-inner quadrant of right female breast: Secondary | ICD-10-CM | POA: Diagnosis not present

## 2023-11-29 ENCOUNTER — Telehealth: Payer: Self-pay

## 2023-11-29 NOTE — Telephone Encounter (Signed)
   Pre-operative Risk Assessment    Patient Name: Summer Hawkins  DOB: 1934-12-13 MRN: [id number]   Date of last office visit: 10/10/23 ORREN LASER, PA-C Date of next office visit: 01/17/24 DANELLE BIRMINGHAM, MD   Request for Surgical Clearance    Procedure:  LUMPECTOMY SURGERY  Date of Surgery:  Clearance TBD                                Surgeon:  DEWARD CURVIN MOULD, MD Surgeon's Group or Practice Name:  [facility] Phone number:  [phone] Fax number:  [phone]  ATTN: ROSALINE SPRANG, CMA   Type of Clearance Requested:   - Medical  - Pharmacy:  Hold Apixaban  (Eliquis )     Type of Anesthesia:  General    Additional requests/questions:    SignedLucie DELENA Ku   11/29/2023, 4:58 PM

## 2023-11-30 ENCOUNTER — Ambulatory Visit: Payer: Self-pay | Admitting: Internal Medicine

## 2023-11-30 ENCOUNTER — Other Ambulatory Visit: Payer: Self-pay | Admitting: *Deleted

## 2023-11-30 DIAGNOSIS — Z170 Estrogen receptor positive status [ER+]: Secondary | ICD-10-CM | POA: Insufficient documentation

## 2023-12-03 NOTE — Progress Notes (Signed)
 Remote PPM Transmission

## 2023-12-04 ENCOUNTER — Other Ambulatory Visit: Payer: Self-pay

## 2023-12-07 ENCOUNTER — Telehealth (HOSPITAL_BASED_OUTPATIENT_CLINIC_OR_DEPARTMENT_OTHER): Payer: Self-pay | Admitting: *Deleted

## 2023-12-07 ENCOUNTER — Inpatient Hospital Stay

## 2023-12-07 ENCOUNTER — Telehealth: Payer: Self-pay | Admitting: Adult Health

## 2023-12-07 ENCOUNTER — Encounter: Payer: Self-pay | Admitting: Hematology

## 2023-12-07 ENCOUNTER — Inpatient Hospital Stay: Attending: Hematology | Admitting: Hematology

## 2023-12-07 VITALS — BP 136/72 | HR 83 | Temp 97.8°F | Resp 17 | Ht 64.0 in | Wt 203.6 lb

## 2023-12-07 DIAGNOSIS — Z8673 Personal history of transient ischemic attack (TIA), and cerebral infarction without residual deficits: Secondary | ICD-10-CM | POA: Diagnosis not present

## 2023-12-07 DIAGNOSIS — Y838 Other surgical procedures as the cause of abnormal reaction of the patient, or of later complication, without mention of misadventure at the time of the procedure: Secondary | ICD-10-CM | POA: Insufficient documentation

## 2023-12-07 DIAGNOSIS — E1122 Type 2 diabetes mellitus with diabetic chronic kidney disease: Secondary | ICD-10-CM | POA: Diagnosis not present

## 2023-12-07 DIAGNOSIS — Z8052 Family history of malignant neoplasm of bladder: Secondary | ICD-10-CM | POA: Diagnosis not present

## 2023-12-07 DIAGNOSIS — N189 Chronic kidney disease, unspecified: Secondary | ICD-10-CM | POA: Insufficient documentation

## 2023-12-07 DIAGNOSIS — T8189XA Other complications of procedures, not elsewhere classified, initial encounter: Secondary | ICD-10-CM | POA: Diagnosis not present

## 2023-12-07 DIAGNOSIS — Z807 Family history of other malignant neoplasms of lymphoid, hematopoietic and related tissues: Secondary | ICD-10-CM | POA: Diagnosis not present

## 2023-12-07 DIAGNOSIS — Z7901 Long term (current) use of anticoagulants: Secondary | ICD-10-CM | POA: Diagnosis not present

## 2023-12-07 DIAGNOSIS — C50211 Malignant neoplasm of upper-inner quadrant of right female breast: Secondary | ICD-10-CM | POA: Insufficient documentation

## 2023-12-07 DIAGNOSIS — Z170 Estrogen receptor positive status [ER+]: Secondary | ICD-10-CM | POA: Insufficient documentation

## 2023-12-07 DIAGNOSIS — I4891 Unspecified atrial fibrillation: Secondary | ICD-10-CM | POA: Insufficient documentation

## 2023-12-07 DIAGNOSIS — Z803 Family history of malignant neoplasm of breast: Secondary | ICD-10-CM | POA: Diagnosis not present

## 2023-12-07 DIAGNOSIS — I129 Hypertensive chronic kidney disease with stage 1 through stage 4 chronic kidney disease, or unspecified chronic kidney disease: Secondary | ICD-10-CM | POA: Insufficient documentation

## 2023-12-07 DIAGNOSIS — G8314 Monoplegia of lower limb affecting left nondominant side: Secondary | ICD-10-CM

## 2023-12-07 NOTE — Telephone Encounter (Signed)
 Jerilynn Lamarr HERO, NP    12/07/23  5:06 PM Note    Name: Summer Hawkins  DOB: 1934/05/30  MRN: [id number]   Primary Cardiologist: Lonni Cash, MD     Preoperative team, please contact this patient and set up a phone call appointment for further preoperative risk assessment. Please obtain consent and complete medication review. Thank you for your help.   I confirm that guidance regarding antiplatelet and oral anticoagulation therapy has been completed and, if necessary, noted below.   I also confirmed the patient resides in the state of [place] . As per [facility] Medical Board telemedicine laws, the patient must reside in the state in which the provider is licensed.     Lamarr Jerilynn, NP 12/07/2023, 5:05 PM [facility]

## 2023-12-07 NOTE — Telephone Encounter (Signed)
   Name: Summer Hawkins  DOB: 1935-01-02  MRN: [id number]  Primary Cardiologist: Lonni Cash, MD   Preoperative team, please contact this patient and set up a phone call appointment for further preoperative risk assessment. Please obtain consent and complete medication review. Thank you for your help.  I confirm that guidance regarding antiplatelet and oral anticoagulation therapy has been completed and, if necessary, noted below.  I also confirmed the patient resides in the state of [place] . As per [facility] Medical Board telemedicine laws, the patient must reside in the state in which the provider is licensed.   Lamarr Satterfield, NP 12/07/2023, 5:05 PM [facility]

## 2023-12-07 NOTE — Progress Notes (Signed)
 [facility]   Telephone:([phone] Fax:([phone]   Clinic New Consult Note   Patient Care Team: Tisovec, Charlie ORN, MD as PCP - General (Internal Medicine) Verlin Lonni BIRCH, MD as PCP - Cardiology (Cardiology) Waddell Danelle ORN, MD as PCP - Electrophysiology (Cardiology) Curvin Deward MOULD, MD as Consulting Physician (General Surgery) Odean Potts, MD as Consulting Physician (Hematology and Oncology) Crawford Morna Pickle, NP as Nurse Practitioner (Hematology and Oncology) 12/07/2023  CHIEF COMPLAINTS/PURPOSE OF CONSULTATION:  Newly diagnosed right breast cancer  REFERRING PHYSICIAN: Dr. Curvin    Discussed the use of AI scribe software for clinical note transcription with the patient, who gave verbal consent to proceed.  History of Present Illness Summer Hawkins is an 89 year old female with a history of left breast cancer who presents for a new consult regarding new right breast cancer. She was referred by her surgeon for further evaluation and management of her breast cancer.  In August, a routine screening mammogram led to a diagnostic mammogram and ultrasound, revealing a 2.1 cm mass in the right breast at the two o'clock position. A biopsy on September 10th confirmed ductal carcinoma, grade 3, ER positive (80% weak staining), PR negative, and HER2 negative by FISH She previously had left breast cancer eight years ago, treated with lumpectomy and hormone therapy anastrozole  for 5 years, which was ER/PR positive and lower grade.  Her past medical history includes diabetes, atrial fibrillation managed with a pacemaker, hypertension, chronic kidney disease, and a stroke in June 2025, from which she fully recovered. She also has a history of back surgery and gallbladder surgery.  Her family history is significant for a sister with breast cancer, a son who died of leukemia and lymphoma at age 48, and a brother diagnosed with bladder cancer at age 69.  Her  current medications include amlodipine , Eliquis , atorvastatin , metoprolol , Toujeo  for diabetes, B12, iron, Synthroid , metformin , and a multivitamin.     MEDICAL HISTORY:  Past Medical History:  Diagnosis Date   Atrial fibrillation (HCC)    Breast cancer (HCC)    Breast cancer, left (HCC)    Chronic kidney disease    stage III - patient was unaware   Diabetes mellitus    Dyspnea    Dysrhythmia    Afib   H/O: hysterectomy    Hardware complicating wound infection 08/28/2022   History of colonoscopy 01/25/2010   History of mammogram 08/04/2009   Hyperlipidemia    Hypertension    Hypothyroidism    Ketoacidosis, diabetic, no coma, non-insulin  dependent    Type II   Presence of permanent cardiac pacemaker    Proteus infection 08/28/2022   Pruritus 07/10/2022   Vaccine counseling 12/31/2022   Vitamin B12 deficiency     SURGICAL HISTORY: Past Surgical History:  Procedure Laterality Date   ABDOMINAL HYSTERECTOMY     BACK SURGERY     Bilateral foot surgery     BREAST BIOPSY Right 11/21/2023   US  RT BREAST BX W LOC DEV 1ST LESION IMG BX SPEC US  GUIDE 11/21/2023 [facility] MAMMOGRAPHY   BREAST EXCISIONAL BIOPSY Left 12/2015   BREAST LUMPECTOMY Left    2017   BREAST LUMPECTOMY WITH RADIOACTIVE SEED AND SENTINEL LYMPH NODE BIOPSY Left 01/19/2016   Procedure: LEFT BREAST LUMPECTOMY WITH RADIOACTIVE SEED AND SENTINEL LYMPH NODE BIOPSY;  Surgeon: Deward Curvin III, MD;  Location: [facility] OR;  Service: General;  Laterality: Left;   BREAST LUMPECTOMY WITH RADIOACTIVE SEED LOCALIZATION Left 01/19/2016   CARDIOVERSION  02/27/2011   Procedure: CARDIOVERSION;  Surgeon: Ezra Shuck, MD;  Location: [facility] OR;  Service: Cardiovascular;  Laterality: N/A;   INSERT / REPLACE / REMOVE PACEMAKER     LAPAROSCOPIC CHOLECYSTECTOMY     LUMBAR WOUND DEBRIDEMENT N/A 12/20/2020   Procedure: LUMBAR WOUND DEBRIDEMENT WITH PLACEMENTOF LUMBAR WOUND VAC;  Surgeon: Mavis Purchase, MD;  Location: [facility] OR;  Service:  Neurosurgery;  Laterality: N/A;   PACEMAKER IMPLANT N/A 12/02/2018   Procedure: PACEMAKER IMPLANT;  Surgeon: Waddell Danelle ORN, MD;  Location: [facility] INVASIVE CV LAB;  Service: Cardiovascular;  Laterality: N/A;   ROTATOR CUFF REPAIR Left    TUBAL LIGATION     WOUND EXPLORATION N/A 01/19/2022   Procedure: REVISION OF LUMBAR WOUND;  Surgeon: Mavis Purchase, MD;  Location: [facility] OR;  Service: Neurosurgery;  Laterality: N/A;  3C    SOCIAL HISTORY: Social History   Socioeconomic History   Marital status: Widowed    Spouse name: Not on file   Number of children: 2   Years of education: Not on file   Highest education level: Not on file  Occupational History    Employer: OTHER    Comment: Worked at [employer]  Tobacco Use   Smoking status: Never    Passive exposure: Never   Smokeless tobacco: Never  Vaping Use   Vaping status: Never Used  Substance and Sexual Activity   Alcohol  use: No    Alcohol /week: 0.0 standard drinks of alcohol    Drug use: No   Sexual activity: Not on file  Other Topics Concern   Not on file  Social History Narrative   Patient since 77   Husband with prostate cancer   10-siblings-no cancer   Social Drivers of Health   Financial Resource Strain: Not on file  Food Insecurity: No Food Insecurity (12/07/2023)   Hunger Vital Sign    Worried About Running Out of Food in the Last Year: Never true    Ran Out of Food in the Last Year: Never true  Transportation Needs: No Transportation Needs (12/07/2023)   PRAPARE - Administrator, Civil Service (Medical): No    Lack of Transportation (Non-Medical): No  Physical Activity: Not on file  Stress: Not on file  Social Connections: Moderately Isolated (08/16/2023)   Social Connection and Isolation Panel    Frequency of Communication with Friends and Family: Once a week    Frequency of Social Gatherings with Friends and Family: Once a week    Attends Religious Services: More than 4 times per year    Active Member  of [employer] or Organizations: Yes    Attends Banker Meetings: 1 to 4 times per year    Marital Status: Widowed  Intimate Partner Violence: Not At Risk (12/07/2023)   Humiliation, Afraid, Rape, and Kick questionnaire    Fear of Current or Ex-Partner: No    Emotionally Abused: No    Physically Abused: No    Sexually Abused: No    FAMILY HISTORY: Family History  Problem Relation Age of Onset   Pneumonia Mother 67   Diabetes Mother 66   Heart failure Father 59       enlarged heart   Breast cancer Sister    Cancer Brother        bladder caner   Cancer Son        lymphoma and leukemia    ALLERGIES:  is allergic to bee venom, penicillins, lisinopril, zocor [simvastatin], and liraglutide.  MEDICATIONS:  Current Outpatient  Medications  Medication Sig Dispense Refill   amLODipine  (NORVASC ) 5 MG tablet Take 1 tablet (5 mg total) by mouth every morning. 90 tablet 3   apixaban  (ELIQUIS ) 2.5 MG TABS tablet Take 1 tablet (2.5 mg total) by mouth 2 (two) times daily. 60 tablet 5   atorvastatin  (LIPITOR) 20 MG tablet Take 1 tablet (20 mg total) by mouth daily.     Cyanocobalamin  (B-12 PO) Take 1 tablet by mouth daily.     ferrous sulfate  325 (65 FE) MG tablet Take 325 mg by mouth daily with breakfast.     irbesartan  (AVAPRO ) 300 MG tablet Take 1 tablet (300 mg total) by mouth daily. 90 tablet 3   levothyroxine  (SYNTHROID ) 125 MCG tablet Take 125 mcg by mouth every evening.     metFORMIN  (GLUCOPHAGE ) 850 MG tablet Take 850 mg by mouth daily.     metoprolol  succinate (TOPROL -XL) 25 MG 24 hr tablet Take 1 tablet (25 mg total) by mouth daily. 90 tablet 3   Multiple Vitamins-Minerals (MULTIVITAMIN WITH MINERALS) tablet Take 1 tablet by mouth in the morning.     timolol  (TIMOPTIC ) 0.5 % ophthalmic solution Place 1 drop into both eyes every morning.     TOUJEO  SOLOSTAR 300 UNIT/ML Solostar Pen Inject 10-12 Units into the skin See admin instructions. Inject 12 units into the skin in the  morning and then inject 10 units at night     No current facility-administered medications for this visit.    REVIEW OF SYSTEMS:   Constitutional: Denies fevers, chills or abnormal night sweats Eyes: Denies blurriness of vision, double vision or watery eyes Ears, nose, mouth, throat, and face: Denies mucositis or sore throat Respiratory: Denies cough, dyspnea or wheezes Cardiovascular: Denies palpitation, chest discomfort or lower extremity swelling Gastrointestinal:  Denies nausea, heartburn or change in bowel habits Skin: Denies abnormal skin rashes Lymphatics: Denies new lymphadenopathy or easy bruising Neurological:Denies numbness, tingling or new weaknesses Behavioral/Psych: Mood is stable, no new changes  All other systems were reviewed with the patient and are negative.  PHYSICAL EXAMINATION: ECOG PERFORMANCE STATUS: 1 - Symptomatic but completely ambulatory  Vitals:   12/07/23 1023  BP: 136/72  Pulse: 83  Resp: 17  Temp: 97.8 F (36.6 C)  SpO2: 96%   Filed Weights   12/07/23 1023  Weight: 203 lb 9.6 oz (92.4 kg)    GENERAL:alert, no distress and comfortable SKIN: skin color, texture, turgor are normal, no rashes or significant lesions EYES: normal, conjunctiva are pink and non-injected, sclera clear OROPHARYNX:no exudate, no erythema and lips, buccal mucosa, and tongue normal  NECK: supple, thyroid  normal size, non-tender, without nodularity LYMPH:  no palpable lymphadenopathy in the cervical, axillary or inguinal LUNGS: clear to auscultation and percussion with normal breathing effort HEART: regular rate & rhythm and no murmurs and no lower extremity edema ABDOMEN:abdomen soft, non-tender and normal bowel sounds Musculoskeletal:no cyanosis of digits and no clubbing  PSYCH: alert & oriented x 3 with fluent speech NEURO: no focal motor/sensory deficits  Physical Exam BREAST: Left breast post-surgical site with incision around nipple, healed well. Bruise from  biopsy on right breast. MUSCULOSKELETAL: Sore in mid-back, healed but weeps fluid.  LABORATORY DATA:  I have reviewed the data as listed    Latest Ref Rng & Units 08/17/2023    7:34 AM 08/16/2023    5:27 AM 08/15/2023   10:41 AM  CBC  WBC 4.0 - 10.5 K/uL 9.2  10.4    Hemoglobin 12.0 - 15.0 g/dL 13.8  12.4  14.3   Hematocrit 36.0 - 46.0 % 42.1  39.0  42.0   Platelets 150 - 400 K/uL 259  251      @cmpl @  RADIOGRAPHIC STUDIES: I have personally reviewed the radiological images as listed and agreed with the findings in the report. CUP PACEART REMOTE DEVICE CHECK Result Date: 11/27/2023 PPM Scheduled remote reviewed. Normal device function.  Presenting rhythm: AP/VS with safety pacing. Next remote 91 days. [facility], [facility]  US  RT BREAST BX W LOC DEV 1ST LESION IMG BX SPEC US  GUIDE Addendum Date: 11/22/2023 ADDENDUM REPORT: 11/22/2023 14:30 ADDENDUM: PATHOLOGY revealed: 1. Breast, right, needle core biopsy, venus - INVASIVE DUCTAL CARCINOMA WITH PROMINENT NECROSIS - OVERALL GRADE: 3 - LYMPHOVASCULAR INVASION: NOT IDENTIFIED - CANCER LENGTH: 1.3 CM - CALCIFICATIONS: NOT IDENTIFIED. Pathology results are CONCORDANT with imaging findings, per Dr. Alm Parkins. Pathology results and recommendations were discussed with patient via telephone on 11/22/2023 by Rock Hover RN. Patient reported biopsy site doing well with no adverse symptoms, and slight tenderness at the site. Post biopsy care instructions were reviewed, questions were answered and my direct phone number was provided. Patient was instructed to call [facility] for any additional questions or concerns related to biopsy site. RECOMMENDATIONS: 1. Surgical and oncological consultation. Request for surgical consultation relayed to Erminio Console at [facility] on 11/22/2023 by Rock Hover RN. Pathology results reported by Rock Hover RN on 11/22/2023. Electronically Signed   By: Alm Parkins M.D.   On: 11/22/2023 14:30    Result Date: 11/22/2023 CLINICAL DATA:  Patient presents for ultrasound-guided core needle biopsy of a right breast mass. EXAM: ULTRASOUND GUIDED RIGHT BREAST CORE NEEDLE BIOPSY COMPARISON:  Previous exam(s). PROCEDURE: I met with the patient and we discussed the procedure of ultrasound-guided biopsy, including benefits and alternatives. We discussed the high likelihood of a successful procedure. We discussed the risks of the procedure, including infection, bleeding, tissue injury, clip migration, and inadequate sampling. Informed written consent was given. The usual time-out protocol was performed immediately prior to the procedure. Lesion quadrant: Upper inner quadrant Using sterile technique and 1% Lidocaine  as local anesthetic, under direct ultrasound visualization, a 12 gauge spring-loaded device was used to perform biopsy of the 2.1 cm mass at 2 o'clock using an inferior approach. At the conclusion of the procedure a venus shaped tissue marker clip was deployed into the biopsy cavity. Follow up 2 view mammogram was performed and dictated separately. IMPRESSION: Ultrasound guided biopsy of a right breast mass. No apparent complications. Electronically Signed: By: Alm Parkins M.D. On: 11/21/2023 12:00   MM CLIP PLACEMENT RIGHT Result Date: 11/21/2023 CLINICAL DATA:  Assess post biopsy marker clip placement following ultrasound-guided core needle biopsy of a right breast mass. EXAM: 3D DIAGNOSTIC RIGHT MAMMOGRAM POST ULTRASOUND BIOPSY COMPARISON:  Previous exam(s). ACR Breast Density Category b: There are scattered areas of fibroglandular density. FINDINGS: 3D Mammographic images were obtained following ultrasound guided biopsy of a right breast mass. The biopsy marking clip is in expected position at the site of biopsy. IMPRESSION: Appropriate positioning of the venus shaped biopsy marking clip at the site of biopsywithin the mass in the upper inner right breast. Final Assessment: Post Procedure  Mammograms for Marker Placement Electronically Signed   By: Alm Parkins M.D.   On: 11/21/2023 12:14   MM 3D DIAGNOSTIC MAMMOGRAM UNILATERAL RIGHT BREAST Result Date: 11/19/2023 CLINICAL DATA:  Recall from screening for a possible right breast asymmetry. EXAM: DIGITAL DIAGNOSTIC UNILATERAL RIGHT  MAMMOGRAM WITH TOMOSYNTHESIS AND CAD; ULTRASOUND RIGHT BREAST LIMITED TECHNIQUE: Right digital diagnostic mammography and breast tomosynthesis was performed. The images were evaluated with computer-aided detection. ; Targeted ultrasound examination of the right breast was performed COMPARISON:  Previous exam(s). ACR Breast Density Category b: There are scattered areas of fibroglandular density. FINDINGS: On spot compression imaging, the possible asymmetry persists as an ill-defined mass measuring approximately 1.7 cm in size, projecting in the upper inner right breast at middle depth. On physical exam, there is a questionable small mass in the upper inner right breast near 2 o'clock. Targeted ultrasound is performed, showing a hypoechoic mass in the right breast at 2 o'clock, 2 cm the nipple, middle depth, measuring 2.1 x 1.2 x 1.5 cm. Margins are partly ill-defined and partly lobulated to irregular. There is minimal internal blood flow on color Doppler analysis. Sonographic imaging of the right axilla demonstrates normal lymph nodes. No enlarged or abnormal nodes. IMPRESSION: 1. Suspicious 2.1 cm mass in the right breast at 2 o'clock, 2 cm the nipple. Tissue sampling is indicated. RECOMMENDATION: 1. Ultrasound-guided core needle biopsy the 2.1 cm mass in the right breast at 2 o'clock. This procedure was scheduled prior to the patient leaving the Breast Center. I have discussed the findings and recommendations with the patient. If applicable, a reminder letter will be sent to the patient regarding the next appointment. BI-RADS CATEGORY  4: Suspicious. Electronically Signed   By: Alm Parkins M.D.   On: 11/19/2023 11:39    US  LIMITED ULTRASOUND INCLUDING AXILLA RIGHT BREAST Result Date: 11/19/2023 CLINICAL DATA:  Recall from screening for a possible right breast asymmetry. EXAM: DIGITAL DIAGNOSTIC UNILATERAL RIGHT MAMMOGRAM WITH TOMOSYNTHESIS AND CAD; ULTRASOUND RIGHT BREAST LIMITED TECHNIQUE: Right digital diagnostic mammography and breast tomosynthesis was performed. The images were evaluated with computer-aided detection. ; Targeted ultrasound examination of the right breast was performed COMPARISON:  Previous exam(s). ACR Breast Density Category b: There are scattered areas of fibroglandular density. FINDINGS: On spot compression imaging, the possible asymmetry persists as an ill-defined mass measuring approximately 1.7 cm in size, projecting in the upper inner right breast at middle depth. On physical exam, there is a questionable small mass in the upper inner right breast near 2 o'clock. Targeted ultrasound is performed, showing a hypoechoic mass in the right breast at 2 o'clock, 2 cm the nipple, middle depth, measuring 2.1 x 1.2 x 1.5 cm. Margins are partly ill-defined and partly lobulated to irregular. There is minimal internal blood flow on color Doppler analysis. Sonographic imaging of the right axilla demonstrates normal lymph nodes. No enlarged or abnormal nodes. IMPRESSION: 1. Suspicious 2.1 cm mass in the right breast at 2 o'clock, 2 cm the nipple. Tissue sampling is indicated. RECOMMENDATION: 1. Ultrasound-guided core needle biopsy the 2.1 cm mass in the right breast at 2 o'clock. This procedure was scheduled prior to the patient leaving the Breast Center. I have discussed the findings and recommendations with the patient. If applicable, a reminder letter will be sent to the patient regarding the next appointment. BI-RADS CATEGORY  4: Suspicious. Electronically Signed   By: Alm Parkins M.D.   On: 11/19/2023 11:39    Assessment & Plan Malignant neoplasm of upper inner quadrant of right breast, invasive ductal  carcinoma, grade 3, pT2N0M0, stage IIB, ER-positive (weak), PR-negative, HER2-negative The right breast cancer is a grade 3 invasive ductal carcinoma, more aggressive than her previous left breast cancer. It is ER-positive (weak), PR-negative, and HER2-negative. The cancer is considered early stage  but aggressive. Chemotherapy is not an option due to her age and recent stroke. - Proceed with surgery for right breast cancer - Initiate anti-estrogen therapy post-surgery, she previously tolerated anastrozole  well, plan to repeat for 5 years. - Consult radiation oncologist post-surgery to evaluate need for radiation therapy - Do not plan to perform oncotype testing  History of Stage I Left breast cancer, status post lumpectomy and hormone therapy She has a history of left breast cancer treated with lumpectomy and hormone therapy. The previous cancer was ER/PR strongly positive and lower grade, with a better prognosis compared to the current right breast cancer.  Atrial fibrillation on anticoagulation Atrial fibrillation is managed with anticoagulation therapy. - Discontinue Eliquis  prior to breast surgery  Type 2 diabetes mellitus, controlled Type 2 diabetes is controlled with current medication regimen.  Hypertension, controlled Hypertension is controlled with current medication regimen.  Chronic kidney disease, unspecified stage Chronic kidney disease is present.  Ischemic stroke, fully recovered She experienced an ischemic stroke in June, from which she has fully recovered. This history impacts the decision to avoid chemotherapy for her current breast cancer.  Left lower extremity paralysis, status post back surgery Left lower extremity paralysis persists following back surgery three years ago. She uses a wheelchair and walker for mobility and performs daily exercises to maintain strength.  Chronic non-healing wound, mid-back, post-surgical The chronic non-healing wound on her mid-back  continues to seep fluid. She manages it with daily cleaning and has seen multiple specialists without resolution.  Genetic testing consideration Given her history of two different types of breast cancer and family history of cancer, genetic testing is considered reasonable. - Schedule genetic testing at a future appointment  Plan - Recent breast imaging and biopsy reviewed and discussed with patient in detail - She will proceed with right lumpectomy in near future with Dr. Curvin, I agree with Dr. Curvin to skip sentinel lymph node biopsy due to her advanced age and clinical negative lymph nodes. - Plan to see her back after surgery and start her on anastrozole  - She is scheduled to see radiation oncologist Dr. Shannon next week   Orders Placed This Encounter  Procedures   Ambulatory referral to Genetics    Referral Priority:   Routine    Referral Type:   Consultation    Referral Reason:   Specialty Services Required    Number of Visits Requested:   1    All questions were answered. The patient knows to call the clinic with any problems, questions or concerns. I spent 40 minutes counseling the patient face to face. The total time spent in the appointment was 45 minutes including review of chart and various tests results, discussions about plan of care and coordination of care plan.     Onita Mattock, MD 12/07/2023 4:24 PM

## 2023-12-07 NOTE — Telephone Encounter (Signed)
 Patient with diagnosis of atrial fibrillation on Eliquis  for anticoagulation.    Procedure:  LUMPECTOMY SURGERY   Date of Surgery:  Clearance TBD         CHA2DS2-VASc Score = 7   This indicates a 11.2% annual risk of stroke. The patient's score is based upon: CHF History: 0 HTN History: 1 Diabetes History: 1 Stroke History: 2 Vascular Disease History: 0 Age Score: 2 Gender Score: 1   Patient had acute ischemic stroke 08/15/2023  CrCl 33  (25 with adjusted body weight) Platelet count 259  Patient has not had an Afib/aflutter ablation or Watchman within the last 3 months or DCCV within the last 30 days   Because has been < 6 months since her stroke, will need to review with primary cardiologist about holding Eliquis .  Because of low CrCl, would need 2-3 day hold to ensure medication clearance.    **This guidance is not considered finalized until pre-operative APP has relayed final recommendations.**

## 2023-12-07 NOTE — Telephone Encounter (Signed)
 Pt has been scheduled tele preop appt 12/11/23. Med rec and consent are done. Per pt surgeon office waiting on clearance before scheduling procedure for breast cancer.

## 2023-12-07 NOTE — Telephone Encounter (Signed)
 Pt has been scheduled tele preop appt 12/11/23. Med rec and consent are done. Per pt surgeon office waiting on clearance before scheduling procedure for breast cancer.     Patient Consent for Virtual Visit        [name] has provided verbal consent on 12/07/2023 for a virtual visit (video or telephone).   CONSENT FOR VIRTUAL VISIT FOR:  Summer Hawkins  By participating in this virtual visit I agree to the following:  I hereby voluntarily request, consent and authorize [facility] and its employed or contracted physicians, physician assistants, nurse practitioners or other licensed health care professionals (the Practitioner), to provide me with telemedicine health care services (the "Services) as deemed necessary by the treating Practitioner. I acknowledge and consent to receive the Services by the Practitioner via telemedicine. I understand that the telemedicine visit will involve communicating with the Practitioner through live audiovisual communication technology and the disclosure of certain medical information by electronic transmission. I acknowledge that I have been given the opportunity to request an in-person assessment or other available alternative prior to the telemedicine visit and am voluntarily participating in the telemedicine visit.  I understand that I have the right to withhold or withdraw my consent to the use of telemedicine in the course of my care at any time, without affecting my right to future care or treatment, and that the Practitioner or I may terminate the telemedicine visit at any time. I understand that I have the right to inspect all information obtained and/or recorded in the course of the telemedicine visit and may receive copies of available information for a reasonable fee.  I understand that some of the potential risks of receiving the Services via telemedicine include:  Delay or interruption in medical evaluation due to technological equipment  failure or disruption; Information transmitted may not be sufficient (e.g. poor resolution of images) to allow for appropriate medical decision making by the Practitioner; and/or  In rare instances, security protocols could fail, causing a breach of personal health information.  Furthermore, I acknowledge that it is my responsibility to provide information about my medical history, conditions and care that is complete and accurate to the best of my ability. I acknowledge that Practitioner's advice, recommendations, and/or decision may be based on factors not within their control, such as incomplete or inaccurate data provided by me or distortions of diagnostic images or specimens that may result from electronic transmissions. I understand that the practice of medicine is not an exact science and that Practitioner makes no warranties or guarantees regarding treatment outcomes. I acknowledge that a copy of this consent can be made available to me via my patient portal [facility] MyChart), or I can request a printed copy by calling the office of [facility].    I understand that my insurance will be billed for this visit.   I have read or had this consent read to me. I understand the contents of this consent, which adequately explains the benefits and risks of the Services being provided via telemedicine.  I have been provided ample opportunity to ask questions regarding this consent and the Services and have had my questions answered to my satisfaction. I give my informed consent for the services to be provided through the use of telemedicine in my medical care

## 2023-12-07 NOTE — Telephone Encounter (Signed)
   Name: Summer Hawkins  DOB: 08/21/1934  MRN: [id number]  Primary Cardiologist: Lonni Cash, MD  Chart reviewed as part of pre-operative protocol coverage. Because of Summer Hawkins's past medical history and time since last visit, she will require a follow-up in-office visit in order to better assess preoperative cardiovascular risk. Last seen by Dr. Cash on 05/29/2022 otherwise followed by EP.  Pre-op covering staff: - Please schedule appointment and call patient to inform them. If patient already had an upcoming appointment within acceptable timeframe, please add pre-op clearance to the appointment notes so provider is aware. - Please contact requesting surgeon's office via preferred method (i.e, phone, fax) to inform them of need for appointment prior to surgery.  Because has been < 6 months since her stroke, will need to review with primary cardiologist about holding Eliquis . Because of low CrCl, would need 2-3 day hold to ensure medication clearance.   Lamarr Satterfield, NP  12/07/2023, 3:36 PM

## 2023-12-07 NOTE — Progress Notes (Signed)
 Location of Breast Cancer:    Histology per Pathology Report:    Receptor Status: ER(80), PR (negative), Her2-neu (), Ki-67(60)  Did patient present with symptoms (if so, please note symptoms) or was this found on screening mammography?: mammogram  Past/Anticipated interventions by surgeon, if jwb:mphyu breast lumpectomy   Past/Anticipated interventions by medical oncology, if any:    Lymphedema issues, if any:  no    Pain issues, if any:  no    SAFETY ISSUES: Prior radiation? no Pacemaker/ICD? Yes, patient has pacemaker Possible current pregnancy?no Is the patient on methotrexate? no  Current Complaints / other details:    BP (!) 161/85 (BP Location: Left Arm, Patient Position: Sitting, Cuff Size: Large)   Pulse 95   Temp (!) 97.3 F (36.3 C)   Resp 20   Ht 5' 4 (1.626 m)   Wt 204 lb 12.8 oz (92.9 kg)   LMP  (LMP Unknown)   SpO2 100%   BMI 35.15 kg/m

## 2023-12-10 ENCOUNTER — Encounter: Payer: Self-pay | Admitting: *Deleted

## 2023-12-10 ENCOUNTER — Telehealth: Payer: Self-pay | Admitting: Cardiovascular Disease

## 2023-12-10 NOTE — Telephone Encounter (Signed)
 Patient returned Pre-op call.

## 2023-12-10 NOTE — Telephone Encounter (Signed)
 Returned call to the pt and left message for the pt call back. I did leave a message that she is scheduled for her tele preop appt 12/11/23.

## 2023-12-11 ENCOUNTER — Ambulatory Visit

## 2023-12-11 ENCOUNTER — Other Ambulatory Visit: Payer: Self-pay

## 2023-12-11 ENCOUNTER — Encounter: Payer: Self-pay | Admitting: Cardiology

## 2023-12-11 ENCOUNTER — Ambulatory Visit: Attending: Cardiology | Admitting: Cardiology

## 2023-12-11 VITALS — BP 130/72 | HR 73 | Ht 64.0 in | Wt 204.6 lb

## 2023-12-11 DIAGNOSIS — I482 Chronic atrial fibrillation, unspecified: Secondary | ICD-10-CM | POA: Insufficient documentation

## 2023-12-11 DIAGNOSIS — I10 Essential (primary) hypertension: Secondary | ICD-10-CM | POA: Diagnosis not present

## 2023-12-11 DIAGNOSIS — Z8673 Personal history of transient ischemic attack (TIA), and cerebral infarction without residual deficits: Secondary | ICD-10-CM | POA: Diagnosis not present

## 2023-12-11 DIAGNOSIS — R001 Bradycardia, unspecified: Secondary | ICD-10-CM | POA: Diagnosis not present

## 2023-12-11 DIAGNOSIS — Z950 Presence of cardiac pacemaker: Secondary | ICD-10-CM | POA: Insufficient documentation

## 2023-12-11 DIAGNOSIS — Z01818 Encounter for other preprocedural examination: Secondary | ICD-10-CM | POA: Insufficient documentation

## 2023-12-11 NOTE — Patient Instructions (Signed)
 Medication Instructions:  Your physician recommends that you continue on your current medications as directed. Please refer to the Current Medication list given to you today.  *If you need a refill on your cardiac medications before your next appointment, please call your pharmacy*  Follow-Up: At [facility], you and your health needs are our priority.  As part of our continuing mission to provide you with exceptional heart care, our providers are all part of one team.  This team includes your primary Cardiologist (physician) and Advanced Practice Providers or APPs (Physician Assistants and Nurse Practitioners) who all work together to provide you with the care you need, when you need it.  We recommend signing up for the patient portal called MyChart.  Sign up information is provided on this After Visit Summary.  MyChart is used to connect with patients for Virtual Visits (Telemedicine).  Patients are able to view lab/test results, encounter notes, upcoming appointments, etc.  Non-urgent messages can be sent to your provider as well.   To learn more about what you can do with MyChart, go to [email or web address].   Other Instructions Keep upcoming appointments

## 2023-12-11 NOTE — Progress Notes (Signed)
 Radiation Oncology         [phone] ________________________________  Initial Outpatient Consultation  Name: Summer Hawkins MRN: [id number]  Date: 12/12/2023  DOB: 05/08/1934  RR:[id number], Charlie ORN, MD  Curvin Deward MOULD, MD   REFERRING PHYSICIAN: Curvin Deward III, MD  DIAGNOSIS: There were no encounter diagnoses.  Malignant neoplasm of upper-inner quadrant of right breast in female, estrogen receptor positive (HCC)   HISTORY OF PRESENT ILLNESS::Summer Hawkins is a 89 y.o. female who is accompanied by ***. she is seen as a courtesy of Dr. Curvin for an opinion concerning radiation therapy as part of management for her recently diagnosed breast cancer. Patient medical history is significant for a history of left breast lumpectomy 8 years ago with sentinel node biopsy that was treated with antiestrogen therapy.    The patient presented to an annual bilateral screening mammogram on 11/05/23 showing possible asymmetry in the right breast. Therefore, she underwent a diagnostics mammogram on 11/19/23 showing a suspicious 2.1 cm mass in the right breast at 2 o'clock, 2 cm the nipple.   Subsequently, she then underwent an ultrasound -guided core needle biopsy the 2.1 cm mass in the right breast at 2 o'clock on 11/21/23 under the care of Dr. Avanell. Pathology indicated grade 3 invasive ductal carcinoma with prominent necrosis present. Prognostic indicators significant for: estrogen receptor 80%, positive, weak staining intensity; progesterone receptor negative; Proliferation marker Ki67 at 60%; Her2 status 2+ equivocal; Grade 3.   In light of findings, she was then referred to Dr. Curvin on 11/28/23 where he recommended undergoing right breast radioactive seed localized lumpectomy but opted to forego the node evaluation due to her significant heart disease.   Most recent follow up with  Dr. Lanny on 12/07/23, they opted to proceed with initiation of anti-estrogen therapy with anastrozole  following her procedure.       Her family history is significant for a sister with breast cancer, a son who died of leukemia and lymphoma at age 13, and a brother diagnosed with bladder cancer at age 36.   The patient underwent a biopsy on {date} showing: ***  PREVIOUS RADIATION THERAPY: {EXAM; YES/NO:19492::No}  PAST MEDICAL HISTORY:  Past Medical History:  Diagnosis Date   Atrial fibrillation (HCC)    Breast cancer (HCC)    Breast cancer, left (HCC)    Chronic kidney disease    stage III - patient was unaware   Diabetes mellitus    Dyspnea    Dysrhythmia    Afib   H/O: hysterectomy    Hardware complicating wound infection 08/28/2022   History of colonoscopy 01/25/2010   History of mammogram 08/04/2009   Hyperlipidemia    Hypertension    Hypothyroidism    Ketoacidosis, diabetic, no coma, non-insulin  dependent    Type II   Presence of permanent cardiac pacemaker    Proteus infection 08/28/2022   Pruritus 07/10/2022   Vaccine counseling 12/31/2022   Vitamin B12 deficiency     PAST SURGICAL HISTORY: Past Surgical History:  Procedure Laterality Date   ABDOMINAL HYSTERECTOMY     BACK SURGERY     Bilateral foot surgery     BREAST BIOPSY Right 11/21/2023   US  RT BREAST BX W LOC DEV 1ST LESION IMG BX SPEC US  GUIDE 11/21/2023 [facility] MAMMOGRAPHY   BREAST EXCISIONAL BIOPSY Left 12/2015   BREAST LUMPECTOMY Left    2017   BREAST LUMPECTOMY WITH RADIOACTIVE SEED AND SENTINEL LYMPH NODE BIOPSY Left 01/19/2016   Procedure: LEFT BREAST LUMPECTOMY  WITH RADIOACTIVE SEED AND SENTINEL LYMPH NODE BIOPSY;  Surgeon: [name], MD;  Location: [facility] OR;  Service: General;  Laterality: Left;   BREAST LUMPECTOMY WITH RADIOACTIVE SEED LOCALIZATION Left 01/19/2016   CARDIOVERSION  02/27/2011   Procedure: CARDIOVERSION;  Surgeon: Ezra Shuck, MD;  Location: [facility] OR;  Service: Cardiovascular;  Laterality: N/A;   INSERT / REPLACE / REMOVE PACEMAKER     LAPAROSCOPIC CHOLECYSTECTOMY     LUMBAR WOUND DEBRIDEMENT N/A  12/20/2020   Procedure: LUMBAR WOUND DEBRIDEMENT WITH PLACEMENTOF LUMBAR WOUND VAC;  Surgeon: Mavis Purchase, MD;  Location: [facility] OR;  Service: Neurosurgery;  Laterality: N/A;   PACEMAKER IMPLANT N/A 12/02/2018   Procedure: PACEMAKER IMPLANT;  Surgeon: Waddell Danelle ORN, MD;  Location: [facility] INVASIVE CV LAB;  Service: Cardiovascular;  Laterality: N/A;   ROTATOR CUFF REPAIR Left    TUBAL LIGATION     WOUND EXPLORATION N/A 01/19/2022   Procedure: REVISION OF LUMBAR WOUND;  Surgeon: Mavis Purchase, MD;  Location: [facility] OR;  Service: Neurosurgery;  Laterality: N/A;  3C    FAMILY HISTORY:  Family History  Problem Relation Age of Onset   Pneumonia Mother 69   Diabetes Mother 50   Heart failure Father 23       enlarged heart   Breast cancer Sister    Cancer Brother        bladder caner   Cancer Son        lymphoma and leukemia    SOCIAL HISTORY:  Social History   Tobacco Use   Smoking status: Never    Passive exposure: Never   Smokeless tobacco: Never  Vaping Use   Vaping status: Never Used  Substance Use Topics   Alcohol  use: No    Alcohol /week: 0.0 standard drinks of alcohol    Drug use: No    ALLERGIES:  Allergies  Allergen Reactions   Bee Venom Shortness Of Breath, Nausea And Vomiting and Other (See Comments)    Makes the patient feel faint, also   Penicillins Anaphylaxis, Hives and Swelling   Lisinopril Cough   Zocor [Simvastatin] Other (See Comments)    memory changes   Liraglutide Rash and Other (See Comments)    Rash at injection site    MEDICATIONS:  Current Outpatient Medications  Medication Sig Dispense Refill   amLODipine  (NORVASC ) 5 MG tablet Take 1 tablet (5 mg total) by mouth every morning. 90 tablet 3   apixaban  (ELIQUIS ) 2.5 MG TABS tablet Take 1 tablet (2.5 mg total) by mouth 2 (two) times daily. 60 tablet 5   atorvastatin  (LIPITOR) 20 MG tablet Take 1 tablet (20 mg total) by mouth daily.     Cyanocobalamin  (B-12 PO) Take 1 tablet by mouth daily.      ferrous sulfate  325 (65 FE) MG tablet Take 325 mg by mouth daily with breakfast.     irbesartan  (AVAPRO ) 300 MG tablet Take 1 tablet (300 mg total) by mouth daily. 90 tablet 3   levothyroxine  (SYNTHROID ) 125 MCG tablet Take 125 mcg by mouth every evening.     metFORMIN  (GLUCOPHAGE ) 850 MG tablet Take 850 mg by mouth daily.     metoprolol  succinate (TOPROL -XL) 25 MG 24 hr tablet Take 1 tablet (25 mg total) by mouth daily. 90 tablet 3   Multiple Vitamins-Minerals (MULTIVITAMIN WITH MINERALS) tablet Take 1 tablet by mouth in the morning.     timolol  (TIMOPTIC ) 0.5 % ophthalmic solution Place 1 drop into both eyes every morning.     TOUJEO  SOLOSTAR  300 UNIT/ML Solostar Pen Inject 10-12 Units into the skin See admin instructions. Inject 12 units into the skin in the morning and then inject 10 units at night     No current facility-administered medications for this visit.    REVIEW OF SYSTEMS:  A 10+ POINT REVIEW OF SYSTEMS WAS OBTAINED including neurology, dermatology, psychiatry, cardiac, respiratory, lymph, extremities, GI, GU, musculoskeletal, constitutional, reproductive, HEENT. ***   PHYSICAL EXAM:  vitals were not taken for this visit.   General: Alert and oriented, in no acute distress HEENT: Head is normocephalic. Extraocular movements are intact. Oropharynx is clear. Neck: Neck is supple, no palpable cervical or supraclavicular lymphadenopathy. Heart: Regular in rate and rhythm with no murmurs, rubs, or gallops. Chest: Clear to auscultation bilaterally, with no rhonchi, wheezes, or rales. Abdomen: Soft, nontender, nondistended, with no rigidity or guarding. Extremities: No cyanosis or edema. Lymphatics: see Neck Exam Skin: No concerning lesions. Musculoskeletal: symmetric strength and muscle tone throughout. Neurologic: Cranial nerves II through XII are grossly intact. No obvious focalities. Speech is fluent. Coordination is intact. Psychiatric: Judgment and insight are intact. Affect  is appropriate. ***  ECOG = ***  0 - Asymptomatic (Fully active, able to carry on all predisease activities without restriction)  1 - Symptomatic but completely ambulatory (Restricted in physically strenuous activity but ambulatory and able to carry out work of a light or sedentary nature. For example, light housework, office work)  2 - Symptomatic, <50% in bed during the day (Ambulatory and capable of all self care but unable to carry out any work activities. Up and about more than 50% of waking hours)  3 - Symptomatic, >50% in bed, but not bedbound (Capable of only limited self-care, confined to bed or chair 50% or more of waking hours)  4 - Bedbound (Completely disabled. Cannot carry on any self-care. Totally confined to bed or chair)  5 - Death   Raylene MM, Creech RH, Tormey DC, et al. [phone]). Toxicity and response criteria of the [facility]. Am. DOROTHA Bridges. Oncol. 5 (6): 649-55  LABORATORY DATA:  Lab Results  Component Value Date   WBC 9.2 08/17/2023   HGB 13.8 08/17/2023   HCT 42.1 08/17/2023   MCV 94.2 08/17/2023   PLT 259 08/17/2023   NEUTROABS 6.9 08/15/2023   Lab Results  Component Value Date   NA 138 08/17/2023   K 4.0 08/17/2023   CL 106 08/17/2023   CO2 21 (L) 08/17/2023   GLUCOSE 147 (H) 08/17/2023   BUN 27 (H) 08/17/2023   CREATININE 1.79 (H) 08/17/2023   CALCIUM  9.2 08/17/2023      RADIOGRAPHY: CUP PACEART REMOTE DEVICE CHECK Result Date: 11/27/2023 PPM Scheduled remote reviewed. Normal device function.  Presenting rhythm: AP/VS with safety pacing. Next remote 91 days. [facility], [facility]  US  RT BREAST BX W LOC DEV 1ST LESION IMG BX SPEC US  GUIDE Addendum Date: 11/22/2023 ADDENDUM REPORT: 11/22/2023 14:30 ADDENDUM: PATHOLOGY revealed: 1. Breast, right, needle core biopsy, venus - INVASIVE DUCTAL CARCINOMA WITH PROMINENT NECROSIS - OVERALL GRADE: 3 - LYMPHOVASCULAR INVASION: NOT IDENTIFIED - CANCER LENGTH: 1.3 CM - CALCIFICATIONS: NOT IDENTIFIED.  Pathology results are CONCORDANT with imaging findings, per Dr. Alm Parkins. Pathology results and recommendations were discussed with patient via telephone on 11/22/2023 by Rock Hover RN. Patient reported biopsy site doing well with no adverse symptoms, and slight tenderness at the site. Post biopsy care instructions were reviewed, questions were answered and my direct phone number was provided. Patient was instructed  to call [facility] for any additional questions or concerns related to biopsy site. RECOMMENDATIONS: 1. Surgical and oncological consultation. Request for surgical consultation relayed to Erminio Console at [facility] on 11/22/2023 by Rock Hover RN. Pathology results reported by Rock Hover RN on 11/22/2023. Electronically Signed   By: Alm Parkins M.D.   On: 11/22/2023 14:30   Result Date: 11/22/2023 CLINICAL DATA:  Patient presents for ultrasound-guided core needle biopsy of a right breast mass. EXAM: ULTRASOUND GUIDED RIGHT BREAST CORE NEEDLE BIOPSY COMPARISON:  Previous exam(s). PROCEDURE: I met with the patient and we discussed the procedure of ultrasound-guided biopsy, including benefits and alternatives. We discussed the high likelihood of a successful procedure. We discussed the risks of the procedure, including infection, bleeding, tissue injury, clip migration, and inadequate sampling. Informed written consent was given. The usual time-out protocol was performed immediately prior to the procedure. Lesion quadrant: Upper inner quadrant Using sterile technique and 1% Lidocaine  as local anesthetic, under direct ultrasound visualization, a 12 gauge spring-loaded device was used to perform biopsy of the 2.1 cm mass at 2 o'clock using an inferior approach. At the conclusion of the procedure a venus shaped tissue marker clip was deployed into the biopsy cavity. Follow up 2 view mammogram was performed and dictated separately. IMPRESSION: Ultrasound guided  biopsy of a right breast mass. No apparent complications. Electronically Signed: By: Alm Parkins M.D. On: 11/21/2023 12:00   MM CLIP PLACEMENT RIGHT Result Date: 11/21/2023 CLINICAL DATA:  Assess post biopsy marker clip placement following ultrasound-guided core needle biopsy of a right breast mass. EXAM: 3D DIAGNOSTIC RIGHT MAMMOGRAM POST ULTRASOUND BIOPSY COMPARISON:  Previous exam(s). ACR Breast Density Category b: There are scattered areas of fibroglandular density. FINDINGS: 3D Mammographic images were obtained following ultrasound guided biopsy of a right breast mass. The biopsy marking clip is in expected position at the site of biopsy. IMPRESSION: Appropriate positioning of the venus shaped biopsy marking clip at the site of biopsywithin the mass in the upper inner right breast. Final Assessment: Post Procedure Mammograms for Marker Placement Electronically Signed   By: Alm Parkins M.D.   On: 11/21/2023 12:14   MM 3D DIAGNOSTIC MAMMOGRAM UNILATERAL RIGHT BREAST Result Date: 11/19/2023 CLINICAL DATA:  Recall from screening for a possible right breast asymmetry. EXAM: DIGITAL DIAGNOSTIC UNILATERAL RIGHT MAMMOGRAM WITH TOMOSYNTHESIS AND CAD; ULTRASOUND RIGHT BREAST LIMITED TECHNIQUE: Right digital diagnostic mammography and breast tomosynthesis was performed. The images were evaluated with computer-aided detection. ; Targeted ultrasound examination of the right breast was performed COMPARISON:  Previous exam(s). ACR Breast Density Category b: There are scattered areas of fibroglandular density. FINDINGS: On spot compression imaging, the possible asymmetry persists as an ill-defined mass measuring approximately 1.7 cm in size, projecting in the upper inner right breast at middle depth. On physical exam, there is a questionable small mass in the upper inner right breast near 2 o'clock. Targeted ultrasound is performed, showing a hypoechoic mass in the right breast at 2 o'clock, 2 cm the nipple, middle  depth, measuring 2.1 x 1.2 x 1.5 cm. Margins are partly ill-defined and partly lobulated to irregular. There is minimal internal blood flow on color Doppler analysis. Sonographic imaging of the right axilla demonstrates normal lymph nodes. No enlarged or abnormal nodes. IMPRESSION: 1. Suspicious 2.1 cm mass in the right breast at 2 o'clock, 2 cm the nipple. Tissue sampling is indicated. RECOMMENDATION: 1. Ultrasound-guided core needle biopsy the 2.1 cm mass in the right breast at 2  o'clock. This procedure was scheduled prior to the patient leaving the Breast Center. I have discussed the findings and recommendations with the patient. If applicable, a reminder letter will be sent to the patient regarding the next appointment. BI-RADS CATEGORY  4: Suspicious. Electronically Signed   By: Alm Parkins M.D.   On: 11/19/2023 11:39   US  LIMITED ULTRASOUND INCLUDING AXILLA RIGHT BREAST Result Date: 11/19/2023 CLINICAL DATA:  Recall from screening for a possible right breast asymmetry. EXAM: DIGITAL DIAGNOSTIC UNILATERAL RIGHT MAMMOGRAM WITH TOMOSYNTHESIS AND CAD; ULTRASOUND RIGHT BREAST LIMITED TECHNIQUE: Right digital diagnostic mammography and breast tomosynthesis was performed. The images were evaluated with computer-aided detection. ; Targeted ultrasound examination of the right breast was performed COMPARISON:  Previous exam(s). ACR Breast Density Category b: There are scattered areas of fibroglandular density. FINDINGS: On spot compression imaging, the possible asymmetry persists as an ill-defined mass measuring approximately 1.7 cm in size, projecting in the upper inner right breast at middle depth. On physical exam, there is a questionable small mass in the upper inner right breast near 2 o'clock. Targeted ultrasound is performed, showing a hypoechoic mass in the right breast at 2 o'clock, 2 cm the nipple, middle depth, measuring 2.1 x 1.2 x 1.5 cm. Margins are partly ill-defined and partly lobulated to irregular.  There is minimal internal blood flow on color Doppler analysis. Sonographic imaging of the right axilla demonstrates normal lymph nodes. No enlarged or abnormal nodes. IMPRESSION: 1. Suspicious 2.1 cm mass in the right breast at 2 o'clock, 2 cm the nipple. Tissue sampling is indicated. RECOMMENDATION: 1. Ultrasound-guided core needle biopsy the 2.1 cm mass in the right breast at 2 o'clock. This procedure was scheduled prior to the patient leaving the Breast Center. I have discussed the findings and recommendations with the patient. If applicable, a reminder letter will be sent to the patient regarding the next appointment. BI-RADS CATEGORY  4: Suspicious. Electronically Signed   By: Alm Parkins M.D.   On: 11/19/2023 11:39      IMPRESSION: Malignant neoplasm of upper-inner quadrant of right breast in female, estrogen receptor positive (HCC)   ***  Today, I talked to the patient and family about the findings and work-up thus far.  We discussed the natural history of *** and general treatment, highlighting the role of radiotherapy in the management.  We discussed the available radiation techniques, and focused on the details of logistics and delivery.  We reviewed the anticipated acute and late sequelae associated with radiation in this setting.  The patient was encouraged to ask questions that I answered to the best of my ability. *** A patient consent form was discussed and signed.  We retained a copy for our records.  The patient would like to proceed with radiation and will be scheduled for CT simulation.  PLAN: ***    *** minutes of total time was spent for this patient encounter, including preparation, face-to-face counseling with the patient and coordination of care, physical exam, and documentation of the encounter.   ------------------------------------------------  Lynwood CHARM Nasuti, PhD, MD  This document serves as a record of services personally performed by Lynwood Nasuti, MD. It was  created on his behalf by Reymundo Cartwright, a trained medical scribe. The creation of this record is based on the scribe's personal observations and the provider's statements to them. This document has been checked and approved by the attending provider.

## 2023-12-11 NOTE — Progress Notes (Addendum)
 Cardiology Office Note   Date:  12/11/2023  ID:  Summer Hawkins, DOB [birth year], MRN [id number] PCP: Vernadine Charlie ORN, MD  [facility] Providers Cardiologist:  Lonni Cash, MD Electrophysiologist:  Danelle Birmingham, MD    History of Present Illness Summer Hawkins is a 89 y.o. female with a past medical history of persistent atrial fibrillation, symptomatic bradycardia s/p pacemaker placement, type 2 diabetes, hypothyroidism, hypertension, hyperlipidemia.  Patient followed by Dr. Cash and Dr. Birmingham.  Presents today as a same-day add-on for preoperative evaluation  Patient first diagnosed with atrial fibrillation in 2012.  Was cardioverted in 02/2011.  Had bradycardia for years and became symptomatic in 2020 at which time a pacemaker was implanted.  Pacemaker has been followed by Dr. Birmingham.  Most recent echo from 08/16/2023 showed EF 60-65%, no regional wall motion abnormalities, mild LVH, mildly reduced RV systolic function, normal PA systolic pressure, moderate left atrial dilation, no significant valvular abnormalities.  Most recent pacer device check showed normal device function.  She has been on Eliquis  2.5 mg twice daily and metoprolol  succinate 25 mg daily for her atrial fibrillation.  Has been on amlodipine  and irbesartan  for blood pressure.  Has a been on Lipitor for hyperlipidemia.  Patient is currently scheduled for lumpectomy.  Presents today for cardiology clearance.  Patient had an acute ischemic stroke in 08/2023.  Because it has been less than 6 months since her stroke, recommended that it be discussed with her cardiologist about holding Eliquis .  Because of low creatinine clearance, will need at least a 2-3-day hold to ensure medication clearance.  On interview, patient reports that she has been doing well from a cardiac perspective.  Denies chest pain, shortness of breath.  Denies palpitations, dizziness, syncope, near syncope.  She was diagnosed with breast  cancer about 3 weeks ago and is in the process of getting scheduled for a lumpectomy.  She is able to do housework and strength training exercises without developing chest pain or dyspnea on exertion.  She checks her blood pressure at home and reports it has been well-controlled, often in the 120s over 80s.  BP today 130/72.   Studies Reviewed Cardiac Studies & Procedures   ______________________________________________________________________________________________     ECHOCARDIOGRAM  ECHOCARDIOGRAM COMPLETE 08/16/2023  Narrative ECHOCARDIOGRAM REPORT    Patient Name:   Summer Hawkins Date of Exam: 08/16/2023 Medical Rec #:  [id number]      Height:       64.0 in Accession #:    [id number]     Weight:       205.5 lb Date of Birth:  [birth year]      BSA:          1.979 m Patient Age:    89 years       BP:           162/68 mmHg Patient Gender: F              HR:           70 bpm. Exam Location:  Inpatient  Procedure: 2D Echo, Color Doppler and Cardiac Doppler (Both Spectral and Color Flow Doppler were utilized during procedure).  Indications:    Stroke  History:        Patient has prior history of Echocardiogram examinations, most recent 07/26/2017.  Sonographer:    Eva Lash Referring Phys: [id number] MARSA NOVAK MELVIN  IMPRESSIONS   1. Left ventricular ejection fraction, by estimation, is 60 to 65%. The left  ventricle has normal function. The left ventricle has no regional wall motion abnormalities. There is mild left ventricular hypertrophy. Left ventricular diastolic parameters were normal. 2. Right ventricular systolic function is mildly reduced. The right ventricular size is mildly enlarged. There is normal pulmonary artery systolic pressure. The estimated right ventricular systolic pressure is 28.2 mmHg. 3. Left atrial size was moderately dilated. 4. The mitral valve is normal in structure. No evidence of mitral valve regurgitation. No evidence of mitral stenosis. 5. The  aortic valve is normal in structure. Aortic valve regurgitation is not visualized. No aortic stenosis is present. 6. The inferior vena cava is normal in size with greater than 50% respiratory variability, suggesting right atrial pressure of 3 mmHg.  FINDINGS Left Ventricle: Left ventricular ejection fraction, by estimation, is 60 to 65%. The left ventricle has normal function. The left ventricle has no regional wall motion abnormalities. Definity  contrast agent was given IV to delineate the left ventricular endocardial borders. The left ventricular internal cavity size was normal in size. There is mild left ventricular hypertrophy. Left ventricular diastolic parameters were normal.  Right Ventricle: The right ventricular size is mildly enlarged. No increase in right ventricular wall thickness. Right ventricular systolic function is mildly reduced. There is normal pulmonary artery systolic pressure. The tricuspid regurgitant velocity is 2.51 m/s, and with an assumed right atrial pressure of 3 mmHg, the estimated right ventricular systolic pressure is 28.2 mmHg.  Left Atrium: Left atrial size was moderately dilated.  Right Atrium: Right atrial size was normal in size.  Pericardium: There is no evidence of pericardial effusion.  Mitral Valve: The mitral valve is normal in structure. Mild mitral annular calcification. No evidence of mitral valve regurgitation. No evidence of mitral valve stenosis.  Tricuspid Valve: The tricuspid valve is normal in structure. Tricuspid valve regurgitation is mild . No evidence of tricuspid stenosis.  Aortic Valve: The aortic valve is normal in structure. Aortic valve regurgitation is not visualized. No aortic stenosis is present. Aortic valve mean gradient measures 3.0 mmHg. Aortic valve peak gradient measures 6.0 mmHg. Aortic valve area, by VTI measures 0.99 cm.  Pulmonic Valve: The pulmonic valve was normal in structure. Pulmonic valve regurgitation is not  visualized. No evidence of pulmonic stenosis.  Aorta: The aortic root is normal in size and structure.  Venous: The inferior vena cava is normal in size with greater than 50% respiratory variability, suggesting right atrial pressure of 3 mmHg.  IAS/Shunts: No atrial level shunt detected by color flow Doppler.  Additional Comments: A device lead is visualized in the right ventricle.   LEFT VENTRICLE PLAX 2D LVIDd:         4.00 cm     Diastology LVIDs:         2.30 cm     LV e' lateral: 9.25 cm/s LV PW:         1.10 cm LV IVS:        1.20 cm LVOT diam:     1.50 cm LV SV:         26 LV SV Index:   13 LVOT Area:     1.77 cm  LV Volumes (MOD) LV vol d, MOD A2C: 87.5 ml LV vol d, MOD A4C: 83.6 ml LV vol s, MOD A2C: 29.4 ml LV vol s, MOD A4C: 25.1 ml LV SV MOD A2C:     58.1 ml LV SV MOD A4C:     83.6 ml LV SV MOD BP:  60.6 ml  RIGHT VENTRICLE RV S prime:     9.68 cm/s TAPSE (M-mode): 1.9 cm  LEFT ATRIUM             Index LA diam:        3.70 cm 1.87 cm/m LA Vol (A2C):   91.6 ml 46.29 ml/m LA Vol (A4C):   81.6 ml 41.23 ml/m LA Biplane Vol: 88.6 ml 44.77 ml/m AORTIC VALVE AV Area (Vmax):    0.91 cm AV Area (Vmean):   0.83 cm AV Area (VTI):     0.99 cm AV Vmax:           122.00 cm/s AV Vmean:          78.600 cm/s AV VTI:            0.259 m AV Peak Grad:      6.0 mmHg AV Mean Grad:      3.0 mmHg LVOT Vmax:         62.50 cm/s LVOT Vmean:        36.900 cm/s LVOT VTI:          0.145 m LVOT/AV VTI ratio: 0.56  AORTA Ao Asc diam: 3.70 cm  TRICUSPID VALVE TR Peak grad:   25.2 mmHg TR Vmax:        251.00 cm/s  SHUNTS Systemic VTI:  0.14 m Systemic Diam: 1.50 cm  Oneil Parchment MD Electronically signed by Oneil Parchment MD Signature Date/Time: 08/16/2023/1:14:42 PM    Final    MONITORS  CARDIAC EVENT MONITOR 12/26/2017  Narrative Atrial fibrillation Rates between 31 beats per minute up to 140 beats per minute No long pauses.  She has been seen in  EP. Pacemaker not indicated currently in absence of symptoms that can be attributed to bradycardia. (syncope or worsened weakness).       ______________________________________________________________________________________________      Risk Assessment/Calculations  CHA2DS2-VASc Score = 7   This indicates a 11.2% annual risk of stroke. The patient's score is based upon: CHF History: 0 HTN History: 1 Diabetes History: 1 Stroke History: 2 Vascular Disease History: 0 Age Score: 2 Gender Score: 1            Physical Exam VS:  BP 130/72   Pulse 73   Ht 5' 4 (1.626 m)   Wt 204 lb 9.6 oz (92.8 kg)   LMP  (LMP Unknown)   SpO2 98%   BMI 35.12 kg/m        Wt Readings from Last 3 Encounters:  12/11/23 204 lb 9.6 oz (92.8 kg)  12/07/23 203 lb 9.6 oz (92.4 kg)  10/10/23 205 lb 9.6 oz (93.3 kg)    GEN: Well nourished, well developed in no acute distress.  Sitting comfortably in the chair NECK: No JVD  CARDIAC:  RRR, no murmurs, rubs, gallops.  RESPIRATORY:  Clear to auscultation without rales, wheezing or rhonchi.  Normal work of breathing on room air ABDOMEN: Soft, non-tender, non-distended EXTREMITIES:  No edema; No deformity   ASSESSMENT AND PLAN  Preoperative Evaluation  - Patient is an 89 year old female with a past medical history of persistent atrial fibrillation, symptomatic bradycardia s/p pacemaker, CKD stage IV, recent CVA.  Recently diagnosed with breast cancer and is pending lumpectomy - Patient denies chest pain, shortness of breath, palpitations, dizziness, syncope, near syncope.  Able to do her own housework and do strength training exercises without chest pain or shortness of breath - Most recent echocardiogram from 08/16/2023 showed EF 60-65%, no  regional wall motion abnormalities, mild LVH, mildly reduced RV systolic function, normal PA systolic pressure, no significant valvular abnormalities -Pacemaker interrogation from 11/25/2023 showed normal device  function - I have routed initial preop request to heart care device pool for recommendations - Patient is on insulin  and has CKD stage IV, both of which increase her risk of perioperative complications.  - As patient is able to complete greater than 4 METS physical activity without anginal symptoms, no further cardiac testing required prior to surgery.  EKG from 08/2023 showed V paced rhythm, heart rate 70 bpm - Patient has been on Eliquis  for persistent atrial fibrillation.  Had a stroke in 08/2023.  As stroke was less than 6 months ago, will reach out to Dr. Verlin about holding eliquis  - Discussed with Dr. Verlin, OK to hold eliquis  for 3 days prior to procedure and resume as soon as able   Persistent Atrial Fibrillation  - Heart rate well-controlled today.  Patient denies palpitations or tachycardia - Continue metoprolol  succinate 25 mg daily - Continue Eliquis  2.5 mg twice daily.  This is the correct dose for her based on age, renal function.  Denies bleeding on Eliquis   Recent CVA  - Patient had CVA in 08/2023.  She had been compliant with her Eliquis  at that time and was on the correct dose.  Question if this represents Eliquis  failure?  - Reviewed neurology notes from admission in 08/2023.  They did not comment on needing to transition from Eliquis  to alternative blood thinner. - Will discuss with Dr. Verlin if patient needs to be transitioned to an alternative anticoagulant -Echocardiogram 08/2023 showed EF 60-65%, no regional wall motion abnormalities, mild LVH, mildly reduced RV systolic function, no significant valvular abnormalities - Continue Lipitor 20 mg daily  Symptomatic bradycardia s/p PPM  - Follows with Dr. Waddell  Hypertension - BP well-controlled - Continue amlodipine  5 mg daily, irbesartan  300 mg daily, metoprolol  succinate 25 mg daily - Creatinine 1.79, potassium 4.0 in 08/2023  CKD stage IV  - Baseline creatinine appears to be around 1.9-2.0.  Had improved to 1.79  on 08/17/2023 - Follow-up with PCP  ADDENDUM 9/30 - Discussed with Dr. Verlin. OK to continue eliquis  2.5 mg BID for persistent atrial fibrillation. Prior to surgery, can hold eliquis  for 3 days.     Dispo: Follow up with Dr. Verlin in 09/2023   Signed, Rollo FABIENE Louder, PA-C

## 2023-12-12 ENCOUNTER — Ambulatory Visit
Admission: RE | Admit: 2023-12-12 | Discharge: 2023-12-12 | Disposition: A | Discharge status: Home / Self Care | Source: Ambulatory Visit | Attending: Radiation Oncology | Admitting: Radiation Oncology

## 2023-12-12 ENCOUNTER — Telehealth: Payer: Self-pay | Admitting: Cardiovascular Disease

## 2023-12-12 ENCOUNTER — Encounter: Payer: Self-pay | Admitting: Radiation Oncology

## 2023-12-12 VITALS — BP 161/85 | HR 95 | Temp 97.3°F | Resp 20 | Ht 64.0 in | Wt 204.8 lb

## 2023-12-12 DIAGNOSIS — Z794 Long term (current) use of insulin: Secondary | ICD-10-CM | POA: Diagnosis not present

## 2023-12-12 DIAGNOSIS — E538 Deficiency of other specified B group vitamins: Secondary | ICD-10-CM | POA: Insufficient documentation

## 2023-12-12 DIAGNOSIS — Z7984 Long term (current) use of oral hypoglycemic drugs: Secondary | ICD-10-CM | POA: Diagnosis not present

## 2023-12-12 DIAGNOSIS — Z79899 Other long term (current) drug therapy: Secondary | ICD-10-CM | POA: Diagnosis not present

## 2023-12-12 DIAGNOSIS — Z7901 Long term (current) use of anticoagulants: Secondary | ICD-10-CM | POA: Diagnosis not present

## 2023-12-12 DIAGNOSIS — E785 Hyperlipidemia, unspecified: Secondary | ICD-10-CM | POA: Diagnosis not present

## 2023-12-12 DIAGNOSIS — Z170 Estrogen receptor positive status [ER+]: Secondary | ICD-10-CM

## 2023-12-12 DIAGNOSIS — Z8052 Family history of malignant neoplasm of bladder: Secondary | ICD-10-CM | POA: Insufficient documentation

## 2023-12-12 DIAGNOSIS — Z806 Family history of leukemia: Secondary | ICD-10-CM | POA: Diagnosis not present

## 2023-12-12 DIAGNOSIS — E119 Type 2 diabetes mellitus without complications: Secondary | ICD-10-CM | POA: Insufficient documentation

## 2023-12-12 DIAGNOSIS — Z1721 Progesterone receptor positive status: Secondary | ICD-10-CM | POA: Diagnosis not present

## 2023-12-12 DIAGNOSIS — Z9049 Acquired absence of other specified parts of digestive tract: Secondary | ICD-10-CM | POA: Insufficient documentation

## 2023-12-12 DIAGNOSIS — C50211 Malignant neoplasm of upper-inner quadrant of right female breast: Secondary | ICD-10-CM | POA: Insufficient documentation

## 2023-12-12 DIAGNOSIS — Z79890 Hormone replacement therapy (postmenopausal): Secondary | ICD-10-CM | POA: Insufficient documentation

## 2023-12-12 DIAGNOSIS — Z803 Family history of malignant neoplasm of breast: Secondary | ICD-10-CM | POA: Diagnosis not present

## 2023-12-12 DIAGNOSIS — I10 Essential (primary) hypertension: Secondary | ICD-10-CM | POA: Insufficient documentation

## 2023-12-12 DIAGNOSIS — I4891 Unspecified atrial fibrillation: Secondary | ICD-10-CM | POA: Diagnosis not present

## 2023-12-12 DIAGNOSIS — E039 Hypothyroidism, unspecified: Secondary | ICD-10-CM | POA: Diagnosis not present

## 2023-12-12 DIAGNOSIS — Z9221 Personal history of antineoplastic chemotherapy: Secondary | ICD-10-CM | POA: Diagnosis not present

## 2023-12-12 DIAGNOSIS — E1122 Type 2 diabetes mellitus with diabetic chronic kidney disease: Secondary | ICD-10-CM | POA: Insufficient documentation

## 2023-12-12 DIAGNOSIS — N183 Chronic kidney disease, stage 3 unspecified: Secondary | ICD-10-CM | POA: Insufficient documentation

## 2023-12-12 DIAGNOSIS — C50412 Malignant neoplasm of upper-outer quadrant of left female breast: Secondary | ICD-10-CM

## 2023-12-12 DIAGNOSIS — Z1732 Human epidermal growth factor receptor 2 negative status: Secondary | ICD-10-CM | POA: Diagnosis not present

## 2023-12-12 NOTE — Telephone Encounter (Signed)
Left message to call back if needed.

## 2023-12-12 NOTE — Telephone Encounter (Signed)
 Pt returning call. Please advise.

## 2023-12-12 NOTE — Telephone Encounter (Signed)
 Patient states she was returning a call. Please dvise

## 2023-12-12 NOTE — Telephone Encounter (Signed)
 Pt made aware that I cannot find where our office called her.  Aware that if someone from our office is looking to speak with her, that they will attempt her again. She is aware that the surgeon who is performing her lumpectomy - that their office would advise her on what to hold and when. Explained our pre op process.  She appreciates the return call.

## 2023-12-17 ENCOUNTER — Other Ambulatory Visit: Payer: Self-pay | Admitting: General Surgery

## 2023-12-17 ENCOUNTER — Encounter: Payer: Self-pay | Admitting: *Deleted

## 2023-12-17 DIAGNOSIS — Z170 Estrogen receptor positive status [ER+]: Secondary | ICD-10-CM

## 2023-12-18 ENCOUNTER — Telehealth: Payer: Self-pay | Admitting: Hematology

## 2023-12-18 NOTE — Telephone Encounter (Signed)
 I contacted Summer Hawkins to inform her of her post op appointment scheduled 2 week after her surgery per the staff message received on 10/6.

## 2024-01-07 ENCOUNTER — Encounter (HOSPITAL_COMMUNITY): Payer: Self-pay

## 2024-01-07 NOTE — Progress Notes (Signed)
 Surgical Instructions   Your procedure is scheduled on Monday, 01/14/24. Report to [facility]trance A at  12:15 P.M., then check in with the Admitting office. Any questions or running late day of surgery: call [phone]  Questions prior to your surgery date: call [phone], Monday-Friday, 8am-4pm. If you experience any cold or flu symptoms such as cough, fever, chills, shortness of breath, etc. between now and your scheduled surgery, please notify us  at the above number.     Remember:  Do not eat after midnight the night before your surgery-Sunday   You may drink clear liquids until 11:15 AM, the morning of your surgery-Monday.   Clear liquids allowed are: Water, Non-Citrus Juices (without pulp), Carbonated Beverages, Clear Tea (no milk, honey, etc.), Black Coffee Only (NO MILK, CREAM OR POWDERED CREAMER of any kind), and Gatorade.    Take these medicines the morning of surgery with A SIP OF WATER: amLODipine  (NORVASC )  atorvastatin  (LIPITOR)  metoprolol  succinate (TOPROL -XL)   timolol  (TIMOPTIC ) eye drops  May take these medicines IF NEEDED: none  Diabetes Type 2 TOUJEO  SOLOSTAR (1/2 dose) - Inject 5 units the night before surgery and inject 6 units on the morning of surgery.    Do  not take Metformin  on the morning of surgery.  We ill check your blood sugar upon arrival to Short Stay Dept. and treat if needed.  Eliquis  - Hold Eliquis  for three days prior to procedure.  Last dose will be on Thursday, 02/10/24.   One week prior to surgery, STOP taking any Aspirin (unless otherwise instructed by your surgeon) Aleve, Naproxen, Ibuprofen, Motrin, Advil, Goody's, BC's, all herbal medications, fish oil, and non-prescription vitamins.                     Do NOT Smoke (Tobacco/Vaping) for 24 hours prior to your procedure.  If you use a CPAP at night, you may bring your mask/headgear for your overnight stay.   You will be asked to remove any contacts, glasses,  piercing's, hearing aid's, dentures/partials prior to surgery. Please bring cases for these items if needed.    Patients discharged the day of surgery will not be allowed to drive home, and someone needs to stay with them for 24 hours.  SURGICAL WAITING ROOM VISITATION Patients may have no more than 2 support people in the waiting area - these visitors may rotate.   Pre-op nurse will coordinate an appropriate time for 1 ADULT support person, who may not rotate, to accompany patient in pre-op.  Children under the age of 34 must have an adult with them who is not the patient and must remain in the main waiting area with an adult.  If the patient needs to stay at the hospital during part of their recovery, the visitor guidelines for inpatient rooms apply.  Please refer to the [facility] website for the visitor guidelines for any additional information.   If you received a COVID test during your pre-op visit  it is requested that you wear a mask when out in public, stay away from anyone that may not be feeling well and notify your surgeon if you develop symptoms. If you have been in contact with anyone that has tested positive in the last 10 days please notify you surgeon.      Pre-operative CHG Bathing Instructions   You can play a key role in reducing the risk of infection after surgery. Your skin needs to be as free of germs  as possible. You can reduce the number of germs on your skin by washing with CHG (chlorhexidine  gluconate) soap before surgery. CHG is an antiseptic soap that kills germs and continues to kill germs even after washing.   DO NOT use if you have an allergy to chlorhexidine /CHG or antibacterial soaps. If your skin becomes reddened or irritated, stop using the CHG and notify one of our RNs at [phone].              TAKE A SHOWER THE NIGHT BEFORE SURGERY   Please keep in mind the following:  DO NOT shave, including legs and underarms, 48 hours prior to surgery.   You  may shave your face before/day of surgery.  Place clean sheets on your bed the night before surgery Use a clean washcloth (not used since being washed) for shower. DO NOT sleep with pet's night before surgery.  CHG Shower Instructions:  Wash your face and private area with normal soap. If you choose to wash your hair, wash first with your normal shampoo.  After you use shampoo/soap, rinse your hair and body thoroughly to remove shampoo/soap residue.  Turn the water OFF and apply half the bottle of CHG soap to a CLEAN washcloth.  Apply CHG soap ONLY FROM YOUR NECK DOWN TO YOUR TOES (washing for 3-5 minutes)  DO NOT use CHG soap on face, private areas, open wounds, or sores.  Pay special attention to the area where your surgery is being performed.  If you are having back surgery, having someone wash your back for you may be helpful. Wait 2 minutes after CHG soap is applied, then you may rinse off the CHG soap.  Pat dry with a clean towel  Put on clean pajamas    Additional instructions for the day of surgery: If you choose, you may shower the morning of surgery with an antibacterial soap.  DO NOT APPLY any lotions, deodorants, cologne, or perfumes.   Do not wear jewelry or makeup Do not wear nail polish, gel polish, artificial nails, or any other type of covering on natural nails (fingers and toes) Do not bring valuables to the hospital. [facility] is not responsible for valuables/personal belongings. Put on clean/comfortable clothes.  Please brush your teeth.  Ask your nurse before applying any prescription medications to the skin.

## 2024-01-08 ENCOUNTER — Encounter: Payer: Self-pay | Admitting: Internal Medicine

## 2024-01-08 ENCOUNTER — Encounter (HOSPITAL_COMMUNITY): Payer: Self-pay

## 2024-01-08 ENCOUNTER — Other Ambulatory Visit: Payer: Self-pay

## 2024-01-08 ENCOUNTER — Encounter (HOSPITAL_COMMUNITY)
Admission: RE | Admit: 2024-01-08 | Discharge: 2024-01-08 | Disposition: A | Discharge status: Home / Self Care | Source: Ambulatory Visit | Attending: General Surgery | Admitting: General Surgery

## 2024-01-08 VITALS — BP 127/80 | HR 74 | Temp 98.5°F | Resp 17 | Ht 64.0 in | Wt 199.0 lb

## 2024-01-08 DIAGNOSIS — C50911 Malignant neoplasm of unspecified site of right female breast: Secondary | ICD-10-CM | POA: Diagnosis not present

## 2024-01-08 DIAGNOSIS — Z950 Presence of cardiac pacemaker: Secondary | ICD-10-CM | POA: Diagnosis not present

## 2024-01-08 DIAGNOSIS — Z01812 Encounter for preprocedural laboratory examination: Secondary | ICD-10-CM | POA: Diagnosis not present

## 2024-01-08 DIAGNOSIS — Z853 Personal history of malignant neoplasm of breast: Secondary | ICD-10-CM | POA: Diagnosis not present

## 2024-01-08 DIAGNOSIS — E785 Hyperlipidemia, unspecified: Secondary | ICD-10-CM | POA: Insufficient documentation

## 2024-01-08 DIAGNOSIS — I1310 Hypertensive heart and chronic kidney disease without heart failure, with stage 1 through stage 4 chronic kidney disease, or unspecified chronic kidney disease: Secondary | ICD-10-CM | POA: Insufficient documentation

## 2024-01-08 DIAGNOSIS — E119 Type 2 diabetes mellitus without complications: Secondary | ICD-10-CM

## 2024-01-08 DIAGNOSIS — Z8673 Personal history of transient ischemic attack (TIA), and cerebral infarction without residual deficits: Secondary | ICD-10-CM | POA: Diagnosis not present

## 2024-01-08 DIAGNOSIS — Z7901 Long term (current) use of anticoagulants: Secondary | ICD-10-CM | POA: Insufficient documentation

## 2024-01-08 DIAGNOSIS — I4891 Unspecified atrial fibrillation: Secondary | ICD-10-CM | POA: Diagnosis not present

## 2024-01-08 DIAGNOSIS — I700 Atherosclerosis of aorta: Secondary | ICD-10-CM | POA: Diagnosis not present

## 2024-01-08 DIAGNOSIS — Z794 Long term (current) use of insulin: Secondary | ICD-10-CM | POA: Insufficient documentation

## 2024-01-08 DIAGNOSIS — E039 Hypothyroidism, unspecified: Secondary | ICD-10-CM | POA: Diagnosis not present

## 2024-01-08 DIAGNOSIS — E1122 Type 2 diabetes mellitus with diabetic chronic kidney disease: Secondary | ICD-10-CM | POA: Diagnosis not present

## 2024-01-08 DIAGNOSIS — I5030 Unspecified diastolic (congestive) heart failure: Secondary | ICD-10-CM | POA: Diagnosis not present

## 2024-01-08 DIAGNOSIS — I6521 Occlusion and stenosis of right carotid artery: Secondary | ICD-10-CM | POA: Insufficient documentation

## 2024-01-08 DIAGNOSIS — Z7984 Long term (current) use of oral hypoglycemic drugs: Secondary | ICD-10-CM | POA: Diagnosis not present

## 2024-01-08 HISTORY — DX: Paralytic syndrome, unspecified: G83.9

## 2024-01-08 LAB — CBC
HCT: 43.7 % (ref 36.0–46.0)
Hemoglobin: 13.7 g/dL (ref 12.0–15.0)
MCH: 29.4 pg (ref 26.0–34.0)
MCHC: 31.4 g/dL (ref 30.0–36.0)
MCV: 93.8 fL (ref 80.0–100.0)
Platelets: 390 K/uL (ref 150–400)
RBC: 4.66 MIL/uL (ref 3.87–5.11)
RDW: 13.4 % (ref 11.5–15.5)
WBC: 12.2 K/uL — ABNORMAL HIGH (ref 4.0–10.5)
nRBC: 0 % (ref 0.0–0.2)

## 2024-01-08 LAB — GLUCOSE, CAPILLARY: Glucose-Capillary: 196 mg/dL — ABNORMAL HIGH (ref 70–99)

## 2024-01-08 LAB — HEMOGLOBIN A1C
Hgb A1c MFr Bld: 7 % — ABNORMAL HIGH (ref 4.8–5.6)
Mean Plasma Glucose: 154.2 mg/dL

## 2024-01-08 LAB — BASIC METABOLIC PANEL WITH GFR
Anion gap: 10 (ref 5–15)
BUN: 25 mg/dL — ABNORMAL HIGH (ref 8–23)
CO2: 21 mmol/L — ABNORMAL LOW (ref 22–32)
Calcium: 9.2 mg/dL (ref 8.9–10.3)
Chloride: 104 mmol/L (ref 98–111)
Creatinine, Ser: 2.03 mg/dL — ABNORMAL HIGH (ref 0.44–1.00)
GFR, Estimated: 23 mL/min — ABNORMAL LOW (ref >60)
Glucose, Bld: 143 mg/dL — ABNORMAL HIGH (ref 70–99)
Potassium: 4.6 mmol/L (ref 3.5–5.1)
Sodium: 135 mmol/L (ref 135–145)

## 2024-01-08 NOTE — Progress Notes (Signed)
 PERIOPERATIVE PRESCRIPTION FOR IMPLANTED CARDIAC DEVICE PROGRAMMING  Patient Information: Name:  Summer Hawkins  DOB:  1935/03/02  MRN:  [id number]  Planned Procedure:  Right Breast Radioactive Seed Localized Lumpectomy  Surgeon:  Dr Curvin  Date of Procedure:  01/14/24  Cautery will be used.  Position during surgery:  Supine  Device Information:  Clinic EP Physician:  Danelle Birmingham, MD   Device Type:  Pacemaker Manufacturer and Phone #:  [employer]: [id number] Pacemaker Dependent?:  Yes.   Date of Last Device Check:  11/25/2023 Normal Device Function?:  Yes.    Electrophysiologist's Recommendations:  Have magnet available. Provide continuous ECG monitoring when magnet is used or reprogramming is to be performed.  Procedure may interfere with device function.  Magnet should be placed over device during procedure.  Per Device Clinic Standing Orders, Delon DELENA Sharps, RN  2:59 PM 01/08/2024

## 2024-01-08 NOTE — Progress Notes (Signed)
 PCP -Vernadine Ade, MD  Cardiologist - Alana Bruckner, MD  PPM/ICD - Pacemaker Device Orders - requested on 01/08/2024 Rep Notified - Donley James by phone and email  Chest x-ray - n/a EKG - 08/16/2023 Stress Test - 01/05/2011 ECHO - 08/16/2023 Cardiac Cath - denies  Sleep Study - denies CPAP - n/a  Fasting Blood Sugar - 110-120 per pt; 196 at PAT Checks Blood Sugar 4 times a day  Last dose of GLP1 agonist-  n/a GLP1 instructions: n/a  Blood Thinner Instructions: hold Eliquis  for 3 days prior to surgery. Last dose on 01/10/2024 Aspirin Instructions: n/a  ERAS Protcol - yes, till 1115 PRE-SURGERY Ensure or G2- no  COVID TEST- n/a   Anesthesia review: yes, seed placement on 01/11/2024; cardiac history, pacemaker  Patient denies shortness of breath, fever, cough and chest pain at PAT appointment   All instructions explained to the patient, with a verbal understanding of the material. Patient agrees to go over the instructions while at home for a better understanding. Patient also instructed to self quarantine after being tested for COVID-19. The opportunity to ask questions was provided.

## 2024-01-09 NOTE — Progress Notes (Signed)
 Anesthesia Chart Review:  Case: [id number] Date/Time: 01/14/24 1400   Procedure: BREAST LUMPECTOMY WITH RADIOACTIVE SEED LOCALIZATION (Right: Breast) - RIGHT BREAST RADIOACTIVE SEED LOCALIZED LUMPECTOMY   Anesthesia type: General   Pre-op diagnosis: RIGHT BREAST CANCER   [facility] ROOM 02 / [facility]   Surgeons: Curvin Deward MOULD, MD       DISCUSSION: Patient is an 89 year old female scheduled for the above procedure.    History includes never smoker, HTN, HLD, afib (s/p DCCV 02/27/11; recurrent afib 06/2017), bradycardia/high grade AVB (s/p dual chamber Medtronic PPM 12/02/18), left breast cancer (s/p left breast lumpectomy 01/19/16, declined radiation; right breast IDC 11/21/2023), DM2, dyspnea, hypothyroidism, CKD (stage III), CVA (08/15/2023), carotid artery disease (RICA occlusion, likely chronic 08/15/2023 CTA), cholecystectomy (2006), spinal surgery (L3-5 PLIF 11/17/20, s/p I&D of wound with wound VAC placement 12/20/20 with revision of lumbar incision 01/19/2022).    [place] admission 08/15/2023 - 08/17/2023 for acute CVA. She was eating breakfast and developed facial droop and aphasia. CT head showed no acute abnormality. CTA head and neck showed LVO at left MCA branch and right ICA occlusion which appeared to be chronic. MRI unable to be performed as patient's pacemaker not MRI compatible. Neurology consulted and discussed consideration of left M2 intervention with Neurointerventional radiology, but deferred after symptoms nearly resolved. ST cleared for regular diet, thin liquids. Repeat CT head showed no acute abnormality. TTE showed LVEF 60-65%, dilated LA. A1c 7.1%. She was already on Eliquis  2.5 mg BID for PAF history. Per neurologist Dr. Eather Popp, Continue Eliquis  as there is no definitive data suggesting switching to an alternative anticoagulant is necessary superior. In case patient has worsening or recurrent symptoms may need to consider switching to IV heparin  if anticoagulation is not  effective. Her neurological deficits are not severe enough to justify mechanical intervention which would be difficult anyhow given the distal location of the occluded vessel. She was discharged to [facility] facility with recommendation for out-patient primary care follow-up and on-going cardiology follow-up. No specific neurology follow-up recommendations seen, and primary care note by Dr. Tisovec indicated she declined out-patient neurology follow-up and instead has been following up with him. Statin therapy was added.    She had previous left breast cancer in 2017, s/p lumpectomy. On 11/05/2023, she had a abnormal mammogram. 11/21/2023 right breast biopsy + grade 3 invasive ductal carcinoma with prominent necrosis. Chemotherapy not felt to be an option given her advanced age and recent CVA. She was referred to general surgery with plans to initiate anti-estrogen therapy post-operatively. Decision regarding radiation will be made after surgery.    She had preoperative cardiology evaluation on 12/11/2023 with Vicci Sauer, PA-C. She is followed for afib, HTN, HLD, diastolic HF, and bradycardia s/p PPM in 2020. CHA2DS2-VASc Score = 7. She noted CVA in June 2025. Also with underlying DM2 and CKD. Patient denied chest pain, SOB, palpitations, dizziness, syncope. Able to do her own housework, strength training exercises without CV symptoms. Most recent echo from 08/16/2023 showed EF 60-65%, no regional wall motion abnormalities, mild LVH, mildly reduced RV systolic function, normal PA systolic pressure, moderate left atrial dilation, no significant valvular abnormalities. Pacemaker interrogation from 11/25/2023 showed normal device function. On Eliquis  2.5 mg BID and metoprolol  succinate 25 mg daily for her atrial fibrillation, amlodipine  and irbesartan  for blood pressure, and Lipitor for hyperlipidemia. In regards to Preoperative Evaluation, she wrote, - As patient is able to complete greater  than 4 METS physical  activity without anginal symptoms, no further cardiac testing required prior to surgery.  EKG from 08/2023 showed V paced rhythm, heart rate 70 bpm - Patient has been on Eliquis  for persistent atrial fibrillation.  Had a stroke in 08/2023.  As stroke was less than 6 months ago, will reach out to Dr. Verlin about holding eliquis  - Discussed with Dr. Verlin, OK to hold eliquis  for 3 days prior to procedure and resume as soon as able. Last Eliquis  planned for 01/10/2024.    PPM is located on her left chest. EP Perioperative PPM Recommendations:    Device Type:  Pacemaker Manufacturer and Phone #:  Medtronic: [phone] Pacemaker Dependent?:  Yes.   Date of Last Device Check:  11/25/2023       Normal Device Function?:  Yes.     Electrophysiologist's Recommendations: Have magnet available. Provide continuous ECG monitoring when magnet is used or reprogramming is to be performed.  Procedure may interfere with device function.  Magnet should be placed over device during procedure.    Preoperative labs reviewed. BUN 25, Creatinine 2.03, eGFR 23. Results appear similar to labs dating back over the past 2 years. K 4.6. H/H 13.7/43.7, PLT 390. A1c 7.0%.   I reviewed with anesthesiologist Darlyn Rush, MD. CVA in June, but now with new right breast cancer. Cardiology gave permission to hold Eliquis . I also sent staff message to Dr. Curvin.  Anesthesia team to evaluate on the day of surgery. RSL is scheduled for 01/11/2024 at 11:30 AM.    VS: BP 127/80   Pulse 74   Temp 36.9 C   Resp 17   Ht 5' 4 (1.626 m)   Wt 90.3 kg   LMP  (LMP Unknown)   SpO2 98%   BMI 34.16 kg/m    PROVIDERS: Tisovec, Charlie ORN, MD is PCP  Verlin Bruckner, MD is cardiologist Waddell Lusher, MD is EP cardiologist Odean Potts, MD is HEM-ONC Shannon Agent, MD is RAD-ONC   LABS: Preoperative labs noted. See DISCUSSION. (all labs ordered are listed, but only abnormal results are  displayed)  Labs Reviewed  GLUCOSE, CAPILLARY - Abnormal; Notable for the following components:      Result Value   Glucose-Capillary 196 (*)    All other components within normal limits  BASIC METABOLIC PANEL WITH GFR - Abnormal; Notable for the following components:   CO2 21 (*)    Glucose, Bld 143 (*)    BUN 25 (*)    Creatinine, Ser 2.03 (*)    GFR, Estimated 23 (*)    All other components within normal limits  CBC - Abnormal; Notable for the following components:   WBC 12.2 (*)    All other components within normal limits  HEMOGLOBIN A1C - Abnormal; Notable for the following components:   Hgb A1c MFr Bld 7.0 (*)    All other components within normal limits    IMAGES: CT Head 08/16/2023: IMPRESSION: 1. No acute intracranial hemorrhage or evidence of an acute infarct. 2. Parenchymal atrophy and chronic small vessel ischemic disease.  CT Perfusion Brain & CTA Head/Neck 08/16/2023: IMPRESSION: - Redemonstrated occlusion of a distal M2 middle division branch of the left MCA. - Associated region of elevated T-max within the left frontal lobe concerning for hypoperfusion. No core infarct identified on CT perfusion. - Redemonstrated occlusion of the right ICA from the proximal cervical segment to the ICA terminus, likely chronic. Reconstitution of the right MCA and ACA via the circle-of-Willis. - Aortic Atherosclerosis (ICD10-I70.0).  EKG: EKG 08/15/2023: probable paced rhythm similar to prior EKG Confirmed by Lenor Hollering [phone]) on 08/15/2023 1:11:54 PM   CV: Echo 08/16/2023: IMPRESSIONS   1. Left ventricular ejection fraction, by estimation, is 60 to 65%. The  left ventricle has normal function. The left ventricle has no regional  wall motion abnormalities. There is mild left ventricular hypertrophy.  Left ventricular diastolic parameters  were normal.   2. Right ventricular systolic function is mildly reduced. The right  ventricular size is mildly enlarged. There is  normal pulmonary artery  systolic pressure. The estimated right ventricular systolic pressure is  28.2 mmHg.   3. Left atrial size was moderately dilated.   4. The mitral valve is normal in structure. No evidence of mitral valve  regurgitation. No evidence of mitral stenosis.   5. The aortic valve is normal in structure. Aortic valve regurgitation is  not visualized. No aortic stenosis is present.   6. The inferior vena cava is normal in size with greater than 50%  respiratory variability, suggesting right atrial pressure of 3 mmHg.     Nuclear stress test 01/05/11: Overall Impression:   Low risk study.  Small area of apical reversibility seen only on short axis and small area of midanterior wall reversibility seen only on vertical images.  This suggests artifact.  SDS 1 and normal wall motion   Past Medical History:  Diagnosis Date   Atrial fibrillation (HCC)    Breast cancer (HCC)    Breast cancer, left (HCC)    Chronic kidney disease    stage III - patient was unaware   Diabetes mellitus    Dyspnea    Dysrhythmia    Afib   H/O: hysterectomy    Hardware complicating wound infection 08/28/2022   History of colonoscopy 01/25/2010   History of mammogram 08/04/2009   Hyperlipidemia    Hypertension    Hypothyroidism    Ketoacidosis, diabetic, no coma, non-insulin  dependent    Type II   Paralysis (HCC)    left leg partially paralized   Presence of permanent cardiac pacemaker    Proteus infection 08/28/2022   Pruritus 07/10/2022   Vaccine counseling 12/31/2022   Vitamin B12 deficiency     Past Surgical History:  Procedure Laterality Date   ABDOMINAL HYSTERECTOMY     BACK SURGERY     Bilateral foot surgery     BREAST BIOPSY Right 11/21/2023   US  RT BREAST BX W LOC DEV 1ST LESION IMG BX SPEC US  GUIDE 11/21/2023 [facility] MAMMOGRAPHY   BREAST EXCISIONAL BIOPSY Left 12/2015   BREAST LUMPECTOMY Left    2017   BREAST LUMPECTOMY WITH RADIOACTIVE SEED AND SENTINEL LYMPH NODE  BIOPSY Left 01/19/2016   Procedure: LEFT BREAST LUMPECTOMY WITH RADIOACTIVE SEED AND SENTINEL LYMPH NODE BIOPSY;  Surgeon: [name], MD;  Location: [facility] OR;  Service: General;  Laterality: Left;   BREAST LUMPECTOMY WITH RADIOACTIVE SEED LOCALIZATION Left 01/19/2016   CARDIOVERSION  02/27/2011   Procedure: CARDIOVERSION;  Surgeon: Ezra Shuck, MD;  Location: [facility] OR;  Service: Cardiovascular;  Laterality: N/A;   INSERT / REPLACE / REMOVE PACEMAKER     LAPAROSCOPIC CHOLECYSTECTOMY     LUMBAR WOUND DEBRIDEMENT N/A 12/20/2020   Procedure: LUMBAR WOUND DEBRIDEMENT WITH PLACEMENTOF LUMBAR WOUND VAC;  Surgeon: Mavis Purchase, MD;  Location[facility] OR;  Service: Neurosurgery;  Laterality: N/A;   PACEMAKER IMPLANT N/A 12/02/2018   Procedure: PACEMAKER IMPLANT;  Surgeon: Waddell Danelle ORN, MD;  Location: [facility] INVASIVE CV LAB;  Service: Cardiovascular;  Laterality: N/A;   ROTATOR CUFF REPAIR Left    TUBAL LIGATION     WOUND EXPLORATION N/A 01/19/2022   Procedure: REVISION OF LUMBAR WOUND;  Surgeon: Mavis Purchase, MD;  Location[facility] OR;  Service: Neurosurgery;  Laterality: N/A;  3C    MEDICATIONS:  amLODipine  (NORVASC ) 5 MG tablet   apixaban  (ELIQUIS ) 2.5 MG TABS tablet   atorvastatin  (LIPITOR) 20 MG tablet   Cyanocobalamin  (B-12 PO)   ferrous sulfate  325 (65 FE) MG tablet   irbesartan  (AVAPRO ) 300 MG tablet   levothyroxine  (SYNTHROID ) 125 MCG tablet   metFORMIN  (GLUCOPHAGE ) 850 MG tablet   metoprolol  succinate (TOPROL -XL) 25 MG 24 hr tablet   Multiple Vitamins-Minerals (MULTIVITAMIN WITH MINERALS) tablet   timolol  (TIMOPTIC ) 0.5 % ophthalmic solution   TOUJEO  SOLOSTAR 300 UNIT/ML Solostar Pen   No current facility-administered medications for this encounter.    Isaiah Ruder, PA-C Surgical Short Stay/Anesthesiology [facility] Phone [phone] [facility] Phone [phone] 01/09/2024 6:23 PM

## 2024-01-09 NOTE — Anesthesia Preprocedure Evaluation (Addendum)
 Anesthesia Evaluation  Patient identified by MRN, date of birth, ID band Patient awake    Reviewed: Allergy & Precautions, NPO status , Patient's Chart, lab work & pertinent test results, reviewed documented beta blocker date and time   Airway Mallampati: III  TM Distance: >3 FB Neck ROM: Full    Dental  (+) Dental Advisory Given, Edentulous Upper   Pulmonary neg pulmonary ROS   Pulmonary exam normal breath sounds clear to auscultation       Cardiovascular hypertension (157/89 preop, per pt normally 120s- took all 3 BP meds this AM per pt including ARB), Pt. on medications and Pt. on home beta blockers Normal cardiovascular exam+ dysrhythmias (eliquis ) Atrial Fibrillation + pacemaker (2020)  Rhythm:Regular Rate:Normal  EKG: EKG 08/15/2023: probable paced rhythm similar to prior EKG Confirmed by Lenor Hollering [phone]) on 08/15/2023 1:11:54 PM     CV: Echo 08/16/2023: IMPRESSIONS   1. Left ventricular ejection fraction, by estimation, is 60 to 65%. The  left ventricle has normal function. The left ventricle has no regional  wall motion abnormalities. There is mild left ventricular hypertrophy.  Left ventricular diastolic parameters  were normal.   2. Right ventricular systolic function is mildly reduced. The right  ventricular size is mildly enlarged. There is normal pulmonary artery  systolic pressure. The estimated right ventricular systolic pressure is  28.2 mmHg.   3. Left atrial size was moderately dilated.   4. The mitral valve is normal in structure. No evidence of mitral valve  regurgitation. No evidence of mitral stenosis.   5. The aortic valve is normal in structure. Aortic valve regurgitation is  not visualized. No aortic stenosis is present.   6. The inferior vena cava is normal in size with greater than 50%  respiratory variability, suggesting right atrial pressure of 3 mmHg.     Neuro/Psych CT Head  08/16/2023: IMPRESSION: 1. No acute intracranial hemorrhage or evidence of an acute infarct. 2. Parenchymal atrophy and chronic small vessel ischemic disease.   CT Perfusion Brain & CTA Head/Neck 08/16/2023: IMPRESSION: - Redemonstrated occlusion of a distal M2 middle division branch of the left MCA. - Associated region of elevated T-max within the left frontal lobe concerning for hypoperfusion. No core infarct identified on CT perfusion. - Redemonstrated occlusion of the right ICA from the proximal cervical segment to the ICA terminus, likely chronic. Reconstitution of the right MCA and ACA via the circle-of-Willis. - Aortic Atherosclerosis (ICD10-I70.0).  CVA (08/2023), Residual Symptoms    GI/Hepatic negative GI ROS, Neg liver ROS,,,  Endo/Other  diabetes, Well Controlled, Type 2, Oral Hypoglycemic Agents, Insulin  DependentHypothyroidism  BMI 34 FS 144 preop Took normal dose of insulin  last night   Renal/GU CRFRenal disease (cr 2.03)     Musculoskeletal  (+) Arthritis , Osteoarthritis,  Multiple back surgeries   Abdominal  (+) + obese  Peds  Hematology negative hematology ROS (+)   Anesthesia Other Findings   Reproductive/Obstetrics                              Anesthesia Physical Anesthesia Plan  ASA: 3  Anesthesia Plan: General   Post-op Pain Management: Tylenol  PO (pre-op)*   Induction: Intravenous  PONV Risk Score and Plan: 3 and Ondansetron , Dexamethasone  and Treatment may vary due to age or medical condition  Airway Management Planned: LMA  Additional Equipment: None  Intra-op Plan:   Post-operative Plan: Extubation in OR  Informed Consent: I have  reviewed the patients History and Physical, chart, labs and discussed the procedure including the risks, benefits and alternatives for the proposed anesthesia with the patient or authorized representative who has indicated his/her understanding and acceptance.     Dental advisory  given  Plan Discussed with: CRNA  Anesthesia Plan Comments: (PAT note written 01/09/2024 by Allison Zelenak, PA-C.  )         Anesthesia Quick Evaluation

## 2024-01-11 ENCOUNTER — Other Ambulatory Visit: Payer: Self-pay | Admitting: General Surgery

## 2024-01-11 ENCOUNTER — Ambulatory Visit
Admission: RE | Admit: 2024-01-11 | Discharge: 2024-01-11 | Disposition: A | Discharge status: Home / Self Care | Source: Ambulatory Visit | Attending: General Surgery | Admitting: General Surgery

## 2024-01-11 DIAGNOSIS — C50211 Malignant neoplasm of upper-inner quadrant of right female breast: Secondary | ICD-10-CM

## 2024-01-11 HISTORY — PX: BREAST BIOPSY: SHX20

## 2024-01-14 ENCOUNTER — Encounter (HOSPITAL_COMMUNITY)
Admission: RE | Disposition: A | Discharge status: Home / Self Care | Payer: Self-pay | Source: Home / Self Care | Attending: General Surgery

## 2024-01-14 ENCOUNTER — Ambulatory Visit (HOSPITAL_COMMUNITY): Payer: Self-pay | Admitting: Anesthesiology

## 2024-01-14 ENCOUNTER — Ambulatory Visit (HOSPITAL_COMMUNITY)
Admission: RE | Admit: 2024-01-14 | Discharge: 2024-01-14 | Disposition: A | Discharge status: Home / Self Care | Attending: General Surgery | Admitting: General Surgery

## 2024-01-14 ENCOUNTER — Ambulatory Visit
Admission: RE | Admit: 2024-01-14 | Discharge: 2024-01-14 | Disposition: A | Discharge status: Home / Self Care | Source: Ambulatory Visit | Attending: General Surgery | Admitting: General Surgery

## 2024-01-14 ENCOUNTER — Ambulatory Visit (HOSPITAL_COMMUNITY): Payer: Self-pay | Admitting: Vascular Surgery

## 2024-01-14 ENCOUNTER — Encounter (HOSPITAL_COMMUNITY): Payer: Self-pay | Admitting: General Surgery

## 2024-01-14 DIAGNOSIS — C50211 Malignant neoplasm of upper-inner quadrant of right female breast: Secondary | ICD-10-CM | POA: Diagnosis not present

## 2024-01-14 DIAGNOSIS — Z1722 Progesterone receptor negative status: Secondary | ICD-10-CM | POA: Diagnosis not present

## 2024-01-14 DIAGNOSIS — E039 Hypothyroidism, unspecified: Secondary | ICD-10-CM | POA: Diagnosis not present

## 2024-01-14 DIAGNOSIS — Z79899 Other long term (current) drug therapy: Secondary | ICD-10-CM | POA: Diagnosis not present

## 2024-01-14 DIAGNOSIS — Z794 Long term (current) use of insulin: Secondary | ICD-10-CM | POA: Diagnosis not present

## 2024-01-14 DIAGNOSIS — C50911 Malignant neoplasm of unspecified site of right female breast: Secondary | ICD-10-CM | POA: Diagnosis not present

## 2024-01-14 DIAGNOSIS — E1122 Type 2 diabetes mellitus with diabetic chronic kidney disease: Secondary | ICD-10-CM

## 2024-01-14 DIAGNOSIS — I1310 Hypertensive heart and chronic kidney disease without heart failure, with stage 1 through stage 4 chronic kidney disease, or unspecified chronic kidney disease: Secondary | ICD-10-CM | POA: Diagnosis not present

## 2024-01-14 DIAGNOSIS — Z79890 Hormone replacement therapy (postmenopausal): Secondary | ICD-10-CM | POA: Diagnosis not present

## 2024-01-14 DIAGNOSIS — Z170 Estrogen receptor positive status [ER+]: Secondary | ICD-10-CM | POA: Diagnosis not present

## 2024-01-14 DIAGNOSIS — E785 Hyperlipidemia, unspecified: Secondary | ICD-10-CM | POA: Insufficient documentation

## 2024-01-14 DIAGNOSIS — Z803 Family history of malignant neoplasm of breast: Secondary | ICD-10-CM | POA: Insufficient documentation

## 2024-01-14 DIAGNOSIS — E119 Type 2 diabetes mellitus without complications: Secondary | ICD-10-CM

## 2024-01-14 DIAGNOSIS — E669 Obesity, unspecified: Secondary | ICD-10-CM | POA: Insufficient documentation

## 2024-01-14 DIAGNOSIS — N183 Chronic kidney disease, stage 3 unspecified: Secondary | ICD-10-CM | POA: Diagnosis not present

## 2024-01-14 DIAGNOSIS — Z6834 Body mass index (BMI) 34.0-34.9, adult: Secondary | ICD-10-CM | POA: Insufficient documentation

## 2024-01-14 DIAGNOSIS — I482 Chronic atrial fibrillation, unspecified: Secondary | ICD-10-CM | POA: Diagnosis not present

## 2024-01-14 DIAGNOSIS — I129 Hypertensive chronic kidney disease with stage 1 through stage 4 chronic kidney disease, or unspecified chronic kidney disease: Secondary | ICD-10-CM

## 2024-01-14 DIAGNOSIS — N189 Chronic kidney disease, unspecified: Secondary | ICD-10-CM | POA: Insufficient documentation

## 2024-01-14 DIAGNOSIS — I4891 Unspecified atrial fibrillation: Secondary | ICD-10-CM | POA: Diagnosis not present

## 2024-01-14 DIAGNOSIS — Z7901 Long term (current) use of anticoagulants: Secondary | ICD-10-CM | POA: Insufficient documentation

## 2024-01-14 DIAGNOSIS — Z1732 Human epidermal growth factor receptor 2 negative status: Secondary | ICD-10-CM | POA: Diagnosis not present

## 2024-01-14 DIAGNOSIS — Z7984 Long term (current) use of oral hypoglycemic drugs: Secondary | ICD-10-CM | POA: Diagnosis not present

## 2024-01-14 DIAGNOSIS — Z833 Family history of diabetes mellitus: Secondary | ICD-10-CM | POA: Insufficient documentation

## 2024-01-14 DIAGNOSIS — M1990 Unspecified osteoarthritis, unspecified site: Secondary | ICD-10-CM | POA: Insufficient documentation

## 2024-01-14 DIAGNOSIS — R921 Mammographic calcification found on diagnostic imaging of breast: Secondary | ICD-10-CM | POA: Diagnosis not present

## 2024-01-14 HISTORY — PX: BREAST LUMPECTOMY WITH RADIOACTIVE SEED LOCALIZATION: SHX6424

## 2024-01-14 LAB — GLUCOSE, CAPILLARY
Glucose-Capillary: 144 mg/dL — ABNORMAL HIGH (ref 70–99)
Glucose-Capillary: 168 mg/dL — ABNORMAL HIGH (ref 70–99)
Glucose-Capillary: 140 mg/dL — ABNORMAL HIGH (ref 70–99)

## 2024-01-14 SURGERY — BREAST LUMPECTOMY WITH RADIOACTIVE SEED LOCALIZATION
Anesthesia: General | Site: Breast | Laterality: Right

## 2024-01-14 MED ORDER — CHLORHEXIDINE GLUCONATE CLOTH 2 % EX PADS: 6.0000 | MEDICATED_PAD | Freq: Once | CUTANEOUS | Status: DC

## 2024-01-14 MED ORDER — VANCOMYCIN HCL IN DEXTROSE 1-5 GM/200ML-% IV SOLN
1000.0000 mg | INTRAVENOUS | Status: AC
  Administered 2024-01-14: 1000 mg via INTRAVENOUS
  Filled 2024-01-14: qty 200

## 2024-01-14 MED ORDER — PHENYLEPHRINE HCL-NACL 20-0.9 MG/250ML-% IV SOLN
INTRAVENOUS | Status: DC | PRN
  Administered 2024-01-14: 30 ug/min via INTRAVENOUS

## 2024-01-14 MED ORDER — ONDANSETRON HCL 4 MG/2ML IJ SOLN
INTRAMUSCULAR | Status: DC | PRN
Start: 2024-01-14 — End: 2024-01-14
  Administered 2024-01-14: 4 mg via INTRAVENOUS

## 2024-01-14 MED ORDER — BUPIVACAINE-EPINEPHRINE 0.25% -1:200000 IJ SOLN
INTRAMUSCULAR | Status: DC | PRN
  Administered 2024-01-14: 20 mL

## 2024-01-14 MED ORDER — LIDOCAINE 2% (20 MG/ML) 5 ML SYRINGE
INTRAMUSCULAR | Status: AC
  Filled 2024-01-14: qty 5

## 2024-01-14 MED ORDER — PHENYLEPHRINE 80 MCG/ML (10ML) SYRINGE FOR IV PUSH (FOR BLOOD PRESSURE SUPPORT)
PREFILLED_SYRINGE | INTRAVENOUS | Status: DC | PRN
  Administered 2024-01-14: 80 ug via INTRAVENOUS
  Administered 2024-01-14: 160 ug via INTRAVENOUS
  Administered 2024-01-14 (×2): 80 ug via INTRAVENOUS

## 2024-01-14 MED ORDER — ORAL CARE MOUTH RINSE: 15.0000 mL | Freq: Once | OROMUCOSAL | Status: AC

## 2024-01-14 MED ORDER — FENTANYL CITRATE (PF) 100 MCG/2ML IJ SOLN
INTRAMUSCULAR | Status: DC | PRN
Start: 2024-01-14 — End: 2024-01-14
  Administered 2024-01-14 (×2): 25 ug via INTRAVENOUS

## 2024-01-14 MED ORDER — ONDANSETRON HCL 4 MG/2ML IJ SOLN
INTRAMUSCULAR | Status: AC
  Filled 2024-01-14: qty 2

## 2024-01-14 MED ORDER — PROPOFOL 10 MG/ML IV BOLUS
INTRAVENOUS | Status: AC
  Filled 2024-01-14: qty 20

## 2024-01-14 MED ORDER — 0.9 % SODIUM CHLORIDE (POUR BTL) OPTIME
TOPICAL | Status: DC | PRN
  Administered 2024-01-14: 1000 mL

## 2024-01-14 MED ORDER — CHLORHEXIDINE GLUCONATE CLOTH 2 % EX PADS
6.0000 | MEDICATED_PAD | Freq: Once | CUTANEOUS | Status: AC
  Administered 2024-01-14: 6 via TOPICAL

## 2024-01-14 MED ORDER — LACTATED RINGERS IV SOLN: INTRAVENOUS | Status: DC | PRN

## 2024-01-14 MED ORDER — INSULIN ASPART 100 UNIT/ML IJ SOLN: 0.0000 [IU] | INTRAMUSCULAR | Status: DC | PRN

## 2024-01-14 MED ORDER — ACETAMINOPHEN 500 MG PO TABS
1000.0000 mg | ORAL_TABLET | Freq: Once | ORAL | Status: AC
  Administered 2024-01-14: 1000 mg via ORAL
  Filled 2024-01-14: qty 2

## 2024-01-14 MED ORDER — LIDOCAINE 2% (20 MG/ML) 5 ML SYRINGE
INTRAMUSCULAR | Status: DC | PRN
  Administered 2024-01-14: 60 mg via INTRAVENOUS

## 2024-01-14 MED ORDER — BUPIVACAINE-EPINEPHRINE (PF) 0.25% -1:200000 IJ SOLN
INTRAMUSCULAR | Status: AC
Start: 2024-01-14 — End: 2024-01-14
  Filled 2024-01-14: qty 30

## 2024-01-14 MED ORDER — GABAPENTIN 100 MG PO CAPS
100.0000 mg | ORAL_CAPSULE | ORAL | Status: AC
  Administered 2024-01-14: 100 mg via ORAL
  Filled 2024-01-14: qty 1

## 2024-01-14 MED ORDER — PROPOFOL 10 MG/ML IV BOLUS
INTRAVENOUS | Status: DC | PRN
Start: 2024-01-14 — End: 2024-01-14
  Administered 2024-01-14: 130 mg via INTRAVENOUS

## 2024-01-14 MED ORDER — PHENYLEPHRINE 80 MCG/ML (10ML) SYRINGE FOR IV PUSH (FOR BLOOD PRESSURE SUPPORT)
PREFILLED_SYRINGE | INTRAVENOUS | Status: AC
  Filled 2024-01-14: qty 10

## 2024-01-14 MED ORDER — FENTANYL CITRATE (PF) 100 MCG/2ML IJ SOLN
INTRAMUSCULAR | Status: AC
  Filled 2024-01-14: qty 2

## 2024-01-14 MED ORDER — OXYCODONE HCL 5 MG PO TABS: 5.0000 mg | ORAL_TABLET | Freq: Four times a day (QID) | ORAL | 0 refills | Status: DC | PRN

## 2024-01-14 MED ORDER — ACETAMINOPHEN 500 MG PO TABS: 1000.0000 mg | ORAL_TABLET | ORAL | Status: DC

## 2024-01-14 MED ORDER — CHLORHEXIDINE GLUCONATE 0.12 % MT SOLN
15.0000 mL | Freq: Once | OROMUCOSAL | Status: AC
Start: 2024-01-14 — End: 2024-01-14
  Administered 2024-01-14: 15 mL via OROMUCOSAL
  Filled 2024-01-14: qty 15

## 2024-01-14 SURGICAL SUPPLY — 28 items
BAG COUNTER SPONGE SURGICOUNT (BAG) IMPLANT
BINDER BREAST LRG (GAUZE/BANDAGES/DRESSINGS) IMPLANT
BINDER BREAST XLRG (GAUZE/BANDAGES/DRESSINGS) IMPLANT
CANISTER SUCTION 3000ML PPV (SUCTIONS) ×1 IMPLANT
CHLORAPREP W/TINT 26 (MISCELLANEOUS) ×1 IMPLANT
CLIP APPLIE 9.375 MED OPEN (MISCELLANEOUS) IMPLANT
COVER PROBE W GEL 5X96 (DRAPES) ×1 IMPLANT
COVER SURGICAL LIGHT HANDLE (MISCELLANEOUS) ×1 IMPLANT
DERMABOND ADVANCED .7 DNX12 (GAUZE/BANDAGES/DRESSINGS) ×1 IMPLANT
DEVICE DUBIN SPECIMEN MAMMOGRA (MISCELLANEOUS) ×1 IMPLANT
DRAPE CHEST BREAST 15X10 FENES (DRAPES) ×1 IMPLANT
ELECT COATED BLADE 2.86 ST (ELECTRODE) ×1 IMPLANT
ELECTRODE REM PT RTRN 9FT ADLT (ELECTROSURGICAL) ×1 IMPLANT
GLOVE BIO SURGEON STRL SZ7.5 (GLOVE) ×2 IMPLANT
GOWN STRL REUS W/ TWL LRG LVL3 (GOWN DISPOSABLE) ×2 IMPLANT
KIT BASIN OR (CUSTOM PROCEDURE TRAY) ×1 IMPLANT
KIT MARKER MARGIN INK (KITS) ×1 IMPLANT
LIGHT WAVEGUIDE WIDE FLAT (MISCELLANEOUS) IMPLANT
NDL HYPO 25GX1X1/2 BEV (NEEDLE) ×1 IMPLANT
NEEDLE HYPO 25GX1X1/2 BEV (NEEDLE) ×1 IMPLANT
PACK GENERAL/GYN (CUSTOM PROCEDURE TRAY) ×1 IMPLANT
SOLN 0.9% NACL POUR BTL 1000ML (IV SOLUTION) ×1 IMPLANT
SUT MNCRL AB 4-0 PS2 18 (SUTURE) ×1 IMPLANT
SUT SILK 2 0 SH (SUTURE) IMPLANT
SUT VIC AB 3-0 SH 18 (SUTURE) ×1 IMPLANT
SYR CONTROL 10ML LL (SYRINGE) ×1 IMPLANT
TOWEL GREEN STERILE (TOWEL DISPOSABLE) ×1 IMPLANT
TOWEL GREEN STERILE FF (TOWEL DISPOSABLE) ×1 IMPLANT

## 2024-01-14 NOTE — Op Note (Signed)
 01/14/2024  3:28 PM  PATIENT:  Summer Hawkins  89 y.o. female  PRE-OPERATIVE DIAGNOSIS:  RIGHT BREAST CANCER  POST-OPERATIVE DIAGNOSIS:  RIGHT BREAST CANCER  PROCEDURE:  Procedure(s) with comments: RIGHT BREAST LUMPECTOMY WITH RADIOACTIVE SEED LOCALIZATION (Right)  SURGEON:  Surgeons and Role:    * Curvin Deward MOULD, MD - Primary  PHYSICIAN ASSISTANT:   ASSISTANTS: none   ANESTHESIA:   local and general  EBL:  minimal   BLOOD ADMINISTERED:none  DRAINS: none   LOCAL MEDICATIONS USED:  MARCAINE      SPECIMEN:  Source of Specimen:  right breast tissue with additional deep and superior margins  DISPOSITION OF SPECIMEN:  PATHOLOGY  COUNTS:  YES  TOURNIQUET:  * No tourniquets in log *  DICTATION: .Dragon Dictation  After informed consent was obtained the patient was brought to the operating room and placed in the supine position on the operating table.  After adequate induction of general anesthesia the patient's right breast was prepped with ChloraPrep, allowed to dry, and draped in usual sterile manner.  An appropriate timeout was performed.  Previously an I-125 seed was placed in the upper inner portion of the central right breast to mark an area of invasive breast cancer.  The neoprobe was set to I-125 and the area of radioactivity was readily identified.  The area around this was infiltrated with quarter percent Marcaine .  A curvilinear incision was made along the upper inner edge of the areola of the right breast with a 15 blade knife.  The incision was carried through the skin and subcutaneous tissue sharply with the electrocautery.  Dissection was then carried throughout the upper inner quadrant between the breast tissue and the subcutaneous fat and skin.  Once this dissection was beyond the area of the cancer I then removed a circular portion of breast tissue sharply with the electrocautery around the radioactive seed while checking the area of radioactivity frequently.  Once  the tissue was removed it was oriented with the appropriate paint colors.  A specimen radiograph was obtained that showed the clip and seed to be near the center of the specimen.  I did elect to take an additional deep and superior margin and these were marked appropriately.  All of the tissue was then sent to pathology for further evaluation.  Hemostasis was achieved using the Bovie electrocautery.  The cavity was marked with clips.  The wound was irrigated with saline and infiltrated with more quarter percent Marcaine .  The deep layer of the wound was then closed with layers of interrupted 3-0 Vicryl stitches.  The skin was then closed with interrupted 4-0 Monocryl subcuticular stitches.  Dermabond dressings were applied.  The patient tolerated the procedure well.  At the end of the case all needle sponge and instrument counts were correct.  The patient was then awakened and taken recovery in stable condition.  PLAN OF CARE: Discharge to home after PACU  PATIENT DISPOSITION:  PACU - hemodynamically stable.   Delay start of Pharmacological VTE agent (>24hrs) due to surgical blood loss or risk of bleeding: not applicable

## 2024-01-14 NOTE — Interval H&P Note (Signed)
 History and Physical Interval Note:  01/14/2024 1:54 PM  Summer Hawkins  has presented today for surgery, with the diagnosis of RIGHT BREAST CANCER.  The various methods of treatment have been discussed with the patient and family. After consideration of risks, benefits and other options for treatment, the patient has consented to  Procedure(s) with comments: BREAST LUMPECTOMY WITH RADIOACTIVE SEED LOCALIZATION (Right) - RIGHT BREAST RADIOACTIVE SEED LOCALIZED LUMPECTOMY as a surgical intervention.  The patient's history has been reviewed, patient examined, no change in status, stable for surgery.  I have reviewed the patient's chart and labs.  Questions were answered to the patient's satisfaction.     [name]

## 2024-01-14 NOTE — Transfer of Care (Signed)
 Immediate Anesthesia Transfer of Care Note  Patient: Charlette Hennings Benett  Procedure(s) Performed: BREAST LUMPECTOMY WITH RADIOACTIVE SEED LOCALIZATION (Right: Breast)  Patient Location: PACU  Anesthesia Type:General  Level of Consciousness: awake, alert , and oriented  Airway & Oxygen  Therapy: Patient Spontanous Breathing and Patient connected to face mask oxygen   Post-op Assessment: Report given to RN, Post -op Vital signs reviewed and stable, Patient moving all extremities X 4, and Patient able to stick tongue midline  Post vital signs: Reviewed and stable  Last Vitals:  Vitals Value Taken Time  BP 105/91 01/14/24 15:38  Temp 97.8   Pulse 71 01/14/24 15:40  Resp 15 01/14/24 15:40  SpO2 95 % 01/14/24 15:40  Vitals shown include unfiled device data.  Last Pain:  Vitals:   01/14/24 1317  TempSrc:   PainSc: 0-No pain         Complications: No notable events documented.

## 2024-01-14 NOTE — H&P (Signed)
 REFERRING PHYSICIAN: Tisovec, Richard, MD PROVIDER: DEWARD GARNETTE NULL, MD MRN: [id number] DOB: December 23, 1934 Subjective   Chief Complaint: New Consultation (Rt breast invasive ductal carcinoma w/prominent necrosis-grade 3)  History of Present Illness: Summer Hawkins is a 89 y.o. female who is seen today as an office consultation for evaluation of New Consultation (Rt breast invasive ductal carcinoma w/prominent necrosis-grade 3)  We are asked to see the patient in consultation by Dr. Richard Tisovec to evaluate her for a new right breast cancer. The patient is a 89 year old white female who recently went for a routine screening mammogram. At that time she was found to have a 2.1 cm mass in the upper inner central right breast. The axilla looked normal. The mass was biopsied and came back as a grade 3 invasive ductal cancer that was weakly ER positive and PR negative. The HER2 is pending. The Ki-67 was 60%. She does have a history of left breast lumpectomy 8 years ago with sentinel node biopsy that was treated with antiestrogen therapy. She does have some significant heart disease with pacemaker. She does take Eliquis  and is followed by Dr. Verlin  Review of Systems: A complete review of systems was obtained from the patient. I have reviewed this information and discussed as appropriate with the patient. See HPI as well for other ROS.  ROS   Medical History: Past Medical History:  Diagnosis Date  Atrial fibrillation (CMS/HHS-HCC)  Breast cancer (CMS/HHS-HCC)  Breast cancer, left (CMS/HHS-HCC)  Chronic kidney disease  Diabetes mellitus without complication (CMS/HHS-HCC)  Dyspnea  Dysrhythmia  H/O: hysterectomy  Hardware complicating wound infection () 08/28/2022  History of colonoscopy 01/25/2010  History of mammogram 08/04/2009  Hyperlipidemia  Hypertension  Hypothyroidism  Presence of permanent cardiac pacemaker  Proteus infection 08/28/2022  Pruritus 07/10/2022  Vitamin B12  deficiency   Patient Active Problem List  Diagnosis  Malignant neoplasm of upper-inner quadrant of right breast in female, estrogen receptor positive (CMS/HHS-HCC)   Past Surgical History:  Procedure Laterality Date  .Cardioversion 02/27/2011  BREAST EXCISIONAL BIOPSY Left 12/2015  Breast lumpectomy with radioactive seed and sentinel lymph node biopsy Left 01/19/2016  Breast lumpectomy with radioactive seed localization Left 01/29/2016  .Pacemaker implant 12/02/2018  Lumbar wound debridement 12/20/2020  BREAST EXCISIONAL BIOPSY Right 11/21/2023  ABDOMINAL HYSTERECTOMY  ARTHROSCOPIC ROTATOR CUFF REPAIR Left  Back surgery  Bilateral foot surgery  Breast lumpectomy Left  Insert / replace / remove pacemaker  LAPAROSCOPIC CHOLECYSTECTOMY  LAPAROSCOPIC TUBAL LIGATION    Allergies  Allergen Reactions  Penicillins Anaphylaxis, Hives, Swelling and Unknown  Bee Venom Protein (Honey Bee) Unknown  Lisinopril Cough  Simvastatin Other (See Comments)  memory changes  Liraglutide Other (See Comments) and Rash  Rash at injection site   Current Outpatient Medications on File Prior to Visit  Medication Sig Dispense Refill  amLODIPine  (NORVASC ) 5 MG tablet Take 5 mg by mouth every morning  apixaban  (ELIQUIS ) 2.5 mg tablet Take 2.5 mg by mouth 2 (two) times daily  atorvastatin  (LIPITOR) 20 MG tablet Take 20 mg by mouth once daily  CONCENTRATED insulin  glargine (TOUJEO  SOLOSTAR U-300 INSULIN ) pen injector (concentration 300 units/mL) Inject 10-12 Units subcutaneously  ferrous sulfate  325 (65 FE) MG tablet Take 325 mg by mouth daily with breakfast  irbesartan  (AVAPRO ) 300 MG tablet Take 300 mg by mouth once daily  levothyroxine  (SYNTHROID ) 125 MCG tablet Take 125 mcg by mouth once daily  metFORMIN  (GLUCOPHAGE ) 850 MG tablet Take 850 mg by mouth daily with breakfast  metoprolol  SUCCinate (TOPROL -XL) 25  MG XL tablet Take 25 mg by mouth once daily  multivitamin with minerals tablet Take 1 tablet  by mouth every morning   No current facility-administered medications on file prior to visit.   Family History  Problem Relation Age of Onset  Pneumonia Mother  Diabetes Mother  Heart failure Father  Breast cancer Sister    Social History   Tobacco Use  Smoking Status Never  Passive exposure: Never  Smokeless Tobacco Never    Social History   Socioeconomic History  Marital status: Widowed  Tobacco Use  Smoking status: Never  Passive exposure: Never  Smokeless tobacco: Never  Substance and Sexual Activity  Alcohol  use: Never  Drug use: Defer   Social Drivers of Health   Food Insecurity: No Food Insecurity (08/16/2023)  Received from [facility]  Hunger Vital Sign  Within the past 12 months, you worried that your food would run out before you got the money to buy more.: Never true  Within the past 12 months, the food you bought just didn't last and you didn't have money to get more.: Never true  Transportation Needs: No Transportation Needs (08/16/2023)  Received from [facility] - Transportation  Lack of Transportation (Medical): No  Lack of Transportation (Non-Medical): No  Social Connections: Moderately Isolated (08/16/2023)  Received from [facility]  Social Connection and Isolation Panel  In a typical week, how many times do you talk on the phone with family, friends, or neighbors?: Once a week  How often do you get together with friends or relatives?: Once a week  How often do you attend church or religious services?: More than 4 times per year  Do you belong to any clubs or organizations such as church groups, unions, fraternal or athletic groups, or school groups?: Yes  How often do you attend meetings of the clubs or organizations you belong to?: 1 to 4 times per year  Are you married, widowed, divorced, separated, never married, or living with a partner?: Widowed  Housing Stability: Unknown (11/28/2023)  Housing Stability Vital Sign  Homeless in the  Last Year: No   Objective:   Vitals:  BP: (!) 159/87  Pulse: 84  Temp: 36.7 C (98.1 F)  TempSrc: Temporal  SpO2: 99%  Weight: 93.4 kg (206 lb)  Height: 162.6 cm (5' 4)  PainSc: 0-No pain   Body mass index is 35.36 kg/m.  Physical Exam Vitals reviewed.  Constitutional:  General: She is not in acute distress. Appearance: Normal appearance.  HENT:  Head: Normocephalic and atraumatic.  Right Ear: External ear normal.  Left Ear: External ear normal.  Nose: Nose normal.  Mouth/Throat:  Mouth: Mucous membranes are moist.  Pharynx: Oropharynx is clear.  Eyes:  General: No scleral icterus. Extraocular Movements: Extraocular movements intact.  Conjunctiva/sclera: Conjunctivae normal.  Pupils: Pupils are equal, round, and reactive to light.  Cardiovascular:  Rate and Rhythm: Normal rate and regular rhythm.  Pulses: Normal pulses.  Heart sounds: Normal heart sounds.  Pulmonary:  Effort: Pulmonary effort is normal. No respiratory distress.  Breath sounds: Normal breath sounds.  Abdominal:  General: Bowel sounds are normal.  Palpations: Abdomen is soft.  Tenderness: There is no abdominal tenderness.  Musculoskeletal:  General: No swelling, tenderness or deformity. Normal range of motion.  Cervical back: Normal range of motion and neck supple.  Comments: There is a pacemaker on the left upper chest wall. She walks with a walker  Skin: General: Skin is warm and dry.  Coloration:  Skin is not jaundiced.  Neurological:  General: No focal deficit present.  Mental Status: She is alert and oriented to person, place, and time.  Psychiatric:  Mood and Affect: Mood normal.  Behavior: Behavior normal.     Breast: There is no palpable mass in either breast. There is no palpable axillary, supraclavicular, or cervical lymphadenopathy  Labs, Imaging and Diagnostic Testing:  Assessment and Plan:   Diagnoses and all orders for this visit:  Malignant neoplasm of upper-inner  quadrant of right breast in female, estrogen receptor positive (CMS/HHS-HCC) - Ambulatory Referral to Oncology-Medical - Ambulatory Referral to Radiation Oncology - [facility] Case Posting Request; Future   The patient appears to have a 2.1 cm cancer in the upper inner quadrant of the right breast with clinically negative nodes. I have discussed with her in detail the different options for treatment and at this point I would favor lumpectomy. Since her lymph nodes are clinically normal and she has some significant heart disease I think we could forego the node evaluation after consulting with medical oncology. I will plan for a right breast radioactive seed localized lumpectomy. I have discussed with her in detail the risks and benefits of the operation as well as some of the technical aspects and she understands and wishes to proceed. I will refer her to medical and radiation oncology to discuss adjuvant therapy. I will also get cardiac clearance from her cardiologist and then we can move forward with scheduling.

## 2024-01-14 NOTE — Anesthesia Procedure Notes (Signed)
 Procedure Name: LMA Insertion Date/Time: 01/14/2024 2:31 PM  Performed by: Harrold Macintosh, CRNAPre-anesthesia Checklist: Patient identified, Emergency Drugs available, Suction available, Patient being monitored and Timeout performed Patient Re-evaluated:Patient Re-evaluated prior to induction Oxygen  Delivery Method: Circle system utilized Preoxygenation: Pre-oxygenation with 100% oxygen  Induction Type: IV induction LMA: LMA inserted LMA Size: 4.0 Number of attempts: 1 Placement Confirmation: positive ETCO2 and breath sounds checked- equal and bilateral Tube secured with: Tape Dental Injury: Teeth and Oropharynx as per pre-operative assessment

## 2024-01-14 NOTE — Anesthesia Postprocedure Evaluation (Signed)
 Anesthesia Post Note  Patient: Summer Hawkins  Procedure(s) Performed: BREAST LUMPECTOMY WITH RADIOACTIVE SEED LOCALIZATION (Right: Breast)     Patient location during evaluation: PACU Anesthesia Type: General Level of consciousness: awake and alert, oriented and patient cooperative Pain management: pain level controlled Vital Signs Assessment: post-procedure vital signs reviewed and stable Respiratory status: spontaneous breathing, nonlabored ventilation and respiratory function stable Cardiovascular status: blood pressure returned to baseline and stable Postop Assessment: no apparent nausea or vomiting Anesthetic complications: no   No notable events documented.  Last Vitals:  Vitals:   01/14/24 1545 01/14/24 1600  BP: 121/72 119/75  Pulse: 69 69  Resp: 15 17  Temp:    SpO2: 93% 93%    Last Pain:  Vitals:   01/14/24 1600  TempSrc:   PainSc: 0-No pain                 Almarie CHRISTELLA Marchi

## 2024-01-15 ENCOUNTER — Encounter (HOSPITAL_COMMUNITY): Payer: Self-pay | Admitting: General Surgery

## 2024-01-16 LAB — SURGICAL PATHOLOGY

## 2024-01-17 ENCOUNTER — Ambulatory Visit: Payer: Self-pay | Admitting: General Surgery

## 2024-01-17 ENCOUNTER — Encounter: Payer: Self-pay | Admitting: *Deleted

## 2024-01-17 ENCOUNTER — Encounter: Admitting: Internal Medicine

## 2024-01-22 ENCOUNTER — Telehealth: Payer: Self-pay | Admitting: Radiation Oncology

## 2024-01-22 NOTE — Telephone Encounter (Signed)
 Left message with rescheduled upcoming appointment due to CT Sim schedule.

## 2024-01-27 NOTE — Assessment & Plan Note (Addendum)
-  pT2N0M0, stage IIb. -Diagnosed in 10/2023.  -discovered by screening MM. US  and MM showed a 2.1 cm mass in the right breast at the two o'clock position. A biopsy on September 10th confirmed ductal carcinoma, grade 3, ER positive (80% weak staining), PR negative, and HER2 negative by FISH  -She underwent right lumpectomy on 01/14/2024, surgical margins negative.

## 2024-01-28 ENCOUNTER — Inpatient Hospital Stay: Attending: Hematology | Admitting: Hematology

## 2024-01-28 VITALS — BP 120/75 | HR 73 | Temp 98.2°F | Resp 15 | Ht 64.0 in | Wt 199.0 lb

## 2024-01-28 DIAGNOSIS — Z803 Family history of malignant neoplasm of breast: Secondary | ICD-10-CM | POA: Diagnosis not present

## 2024-01-28 DIAGNOSIS — C50211 Malignant neoplasm of upper-inner quadrant of right female breast: Secondary | ICD-10-CM | POA: Diagnosis not present

## 2024-01-28 DIAGNOSIS — C50412 Malignant neoplasm of upper-outer quadrant of left female breast: Secondary | ICD-10-CM | POA: Diagnosis not present

## 2024-01-28 DIAGNOSIS — Z79811 Long term (current) use of aromatase inhibitors: Secondary | ICD-10-CM | POA: Insufficient documentation

## 2024-01-28 DIAGNOSIS — Z170 Estrogen receptor positive status [ER+]: Secondary | ICD-10-CM | POA: Insufficient documentation

## 2024-01-28 DIAGNOSIS — Z853 Personal history of malignant neoplasm of breast: Secondary | ICD-10-CM | POA: Insufficient documentation

## 2024-01-28 MED ORDER — ANASTROZOLE 1 MG PO TABS: 1.0000 mg | ORAL_TABLET | Freq: Every day | ORAL | 1 refills | Status: DC

## 2024-01-28 NOTE — Progress Notes (Signed)
 [facility]   [phone] [phone]   Clinic Follow up Note   Patient Care Team: Tisovec, Charlie ORN, MD as PCP - General (Internal Medicine) Verlin Lonni BIRCH, MD as PCP - Cardiology (Cardiology) Waddell Danelle ORN, MD as PCP - Electrophysiology (Cardiology) Curvin Deward MOULD, MD as Consulting Physician (General Surgery) Lanny Callander, MD as Consulting Physician (Hematology) Shannon Agent, MD as Consulting Physician (Radiation Oncology) Tyree Nanetta SAILOR, RN as Oncology Nurse Navigator Gerome Devere HERO, RN as Oncology Nurse Navigator  Date of Service:  01/28/2024  CHIEF COMPLAINT: f/u of right breast cancer  CURRENT THERAPY:  Pending adjuvant anastrozole   Oncology History   Malignant neoplasm of upper-inner quadrant of right breast in female, estrogen receptor positive (HCC)  -pT2N0M0, stage IIb. -Diagnosed in 10/2023.  -discovered by screening MM. US  and MM showed a 2.1 cm mass in the right breast at the two o'clock position. A biopsy on September 10th confirmed ductal carcinoma, grade 3, ER positive (80% weak staining), PR negative, and HER2 negative by FISH  -She underwent right lumpectomy on 01/14/2024, surgical margins negative.  Breast cancer of upper-outer quadrant of left female breast (HCC) pT1cN0M0, stage IA, G2, ER+/PR+/HER2-, Oncotype RS 8  12/10/2015: Left breast biopsy: Grade 1-2 IDC with calcifications with DCIS, ER 100%, PR 90%, Ki-67 5%, HER-2 negative ratio 1.45; left breast mass 1.9 x 1 x 1.1 cm; T1 CN 0 stage IA clinical stage 01/19/2016 Left lumpectomy: IDC with calcifications, 1.8 cms, grade 2, DCIS with calcifications, 0/1 lymph node negative, T1 CN 0 stage IA Oncotype DX score 8: Risk of recurrence 6% Patient refused radiation therapy She completed 5 years of adjuvant anastrozole .  Assessment & Plan High-grade right breast cancer, post-surgical management Status post a lumpectomy.  I reviewed her surgical pathology, which  showed grade 3 right breast ductal cancer, ER 80% weak positive, PR negative, HER2 negative by FISH.  She tolerated surgery very well.  Tumor was small but aggressive. Tumor is hormone-sensitive. Chemotherapy not recommended due to age. Anastrozole  therapy recommended due to hormone sensitivity. Radiation therapy considered but may be skipped due to age and logistical challenges. Genetic testing recommended due to family history of breast cancer. - Prescribed anastrozole  once daily for five years. - Referred to radiation oncologist for evaluation of radiation therapy. -Due to her personal history of breast cancer twice, will refer her to genetics.  She agrees. - Scheduled follow-up in six months.  Survivorship in 3 months.  Plan - Surgical path reviewed - I called in anastrozole  1 mg daily, she can start in the next few weeks.  She previously tolerated well for left breast cancer. - Will refer her back to Dr. Shannon to finalize adjuvant breast radiation. - Will schedule survivorship in 3 months and follow-up with me in 6 months.     SUMMARY OF ONCOLOGIC HISTORY: Oncology History  Breast cancer of upper-outer quadrant of left female breast (HCC)  12/15/2015 Initial Diagnosis   Left breast biopsy: Grade 1-2 IDC with calcifications with DCIS, ER 100%, PR 90%, Ki-67 5%, HER-2 negative ratio 1.45; left breast mass 1.9 x 1 x 1.1 cm; T1 CN 0 stage IA clinical stage   01/19/2016 Surgery   Left lumpectomy: IDC with calcifications, 1.8 cm, grade 2, DCIS with calcifications, 0/1 lymph node negative, T1 CN 0 stage IA   01/26/2016 Oncotype testing   Oncotype DX score 8: risk of recurrence 6%    Radiation Therapy   Refused radiation therapy   04/13/2016 -  Anti-estrogen oral therapy   Anastrozole  1 mg daily   Malignant neoplasm of upper-inner quadrant of right breast in female, estrogen receptor positive (HCC)  11/21/2023 Cancer Staging   Staging form: Breast, AJCC 8th Edition - Clinical stage from  11/21/2023: Stage IIB (cT2, cN0, cM0, G3, ER+, PR-, HER2-) - Signed by Lanny Callander, MD on 12/07/2023 Stage prefix: Initial diagnosis Histologic grading system: 3 grade system   11/30/2023 Initial Diagnosis   Malignant neoplasm of upper-inner quadrant of right breast in female, estrogen receptor positive (HCC)   01/14/2024 Cancer Staging   Staging form: Breast, AJCC 8th Edition - Pathologic stage from 01/14/2024: Stage Unknown (pT2, pNX, G3, ER+, PR-, HER2-) - Signed by Lanny Callander, MD on 01/27/2024 Stage prefix: Initial diagnosis Histologic grading system: 3 grade system Residual tumor (R): R0      Discussed the use of AI scribe software for clinical note transcription with the patient, who gave verbal consent to proceed.  History of Present Illness Summer Hawkins is an 89 year old female with breast cancer who presents for follow-up after surgery.  She underwent surgery on January 14, 2024, for the removal of a breast tumor. The tumor was initially detected as 1.7 cm on mammogram and measured 2.5 cm at the time of surgery. It is a high-grade, grade three tumor. She reports no problems with the incision and is not taking any pain medication.  She was previously diagnosed with breast cancer in 2017 and treated with anastrozole  for five years, which she tolerated well.  Her family history is significant for cancer, including a sister with breast cancer who underwent radiation and hormonal therapy, and a son who had lymphoma and passed away after treatment at [facility].  She has a history of back surgery that resulted in paralysis of her left leg, necessitating the use of a wheelchair for mobility outside the home and a rollator at home. She experiences pain in her left leg, which she can lift only slightly.     All other systems were reviewed with the patient and are negative.  MEDICAL HISTORY:  Past Medical History:  Diagnosis Date   Atrial fibrillation (HCC)    Breast cancer (HCC)     Breast cancer, left (HCC)    Chronic kidney disease    stage III - patient was unaware   Diabetes mellitus    Dyspnea    Dysrhythmia    Afib   H/O: hysterectomy    Hardware complicating wound infection 08/28/2022   History of colonoscopy 01/25/2010   History of mammogram 08/04/2009   Hyperlipidemia    Hypertension    Hypothyroidism    Ketoacidosis, diabetic, no coma, non-insulin  dependent    Type II   Paralysis (HCC)    left leg partially paralized   Presence of permanent cardiac pacemaker    Proteus infection 08/28/2022   Pruritus 07/10/2022   Vaccine counseling 12/31/2022   Vitamin B12 deficiency     SURGICAL HISTORY: Past Surgical History:  Procedure Laterality Date   ABDOMINAL HYSTERECTOMY     BACK SURGERY     Bilateral foot surgery     BREAST BIOPSY Right 11/21/2023   US  RT BREAST BX W LOC DEV 1ST LESION IMG BX SPEC US  GUIDE 11/21/2023 [facility]   BREAST BIOPSY  01/11/2024   US  RT RADIOACTIVE SEED LOC 01/11/2024 [facility]   BREAST EXCISIONAL BIOPSY Left 12/2015   BREAST LUMPECTOMY Left    2017   BREAST LUMPECTOMY WITH RADIOACTIVE SEED  AND SENTINEL LYMPH NODE BIOPSY Left 01/19/2016   Procedure: LEFT BREAST LUMPECTOMY WITH RADIOACTIVE SEED AND SENTINEL LYMPH NODE BIOPSY;  Surgeon: [name], MD;  Location: [facility] OR;  Service: General;  Laterality: Left;   BREAST LUMPECTOMY WITH RADIOACTIVE SEED LOCALIZATION Left 01/19/2016   BREAST LUMPECTOMY WITH RADIOACTIVE SEED LOCALIZATION Right 01/14/2024   Procedure: BREAST LUMPECTOMY WITH RADIOACTIVE SEED LOCALIZATION;  Surgeon: Null Deward MOULD, MD;  Location: [facility] OR;  Service: General;  Laterality: Right;  RIGHT BREAST RADIOACTIVE SEED LOCALIZED LUMPECTOMY   CARDIOVERSION  02/27/2011   Procedure: CARDIOVERSION;  Surgeon: Ezra Shuck, MD;  Location: [facility] OR;  Service: Cardiovascular;  Laterality: N/A;   INSERT / REPLACE / REMOVE PACEMAKER     LAPAROSCOPIC CHOLECYSTECTOMY     LUMBAR WOUND DEBRIDEMENT N/A  12/20/2020   Procedure: LUMBAR WOUND DEBRIDEMENT WITH PLACEMENTOF LUMBAR WOUND VAC;  Surgeon: Mavis Purchase, MD;  Location: [facility] OR;  Service: Neurosurgery;  Laterality: N/A;   PACEMAKER IMPLANT N/A 12/02/2018   Procedure: PACEMAKER IMPLANT;  Surgeon: Waddell Danelle ORN, MD;  Location: [facility] INVASIVE CV LAB;  Service: Cardiovascular;  Laterality: N/A;   ROTATOR CUFF REPAIR Left    TUBAL LIGATION     WOUND EXPLORATION N/A 01/19/2022   Procedure: REVISION OF LUMBAR WOUND;  Surgeon: Mavis Purchase, MD;  Location: [facility] OR;  Service: Neurosurgery;  Laterality: N/A;  3C    I have reviewed the social history and family history with the patient and they are unchanged from previous note.  ALLERGIES:  is allergic to bee venom, penicillins, lisinopril, zocor [simvastatin], and liraglutide.  MEDICATIONS:  Current Outpatient Medications  Medication Sig Dispense Refill   amLODipine  (NORVASC ) 5 MG tablet Take 1 tablet (5 mg total) by mouth every morning. 90 tablet 3   anastrozole  (ARIMIDEX ) 1 MG tablet Take 1 tablet (1 mg total) by mouth daily. 90 tablet 1   apixaban  (ELIQUIS ) 2.5 MG TABS tablet Take 1 tablet (2.5 mg total) by mouth 2 (two) times daily. 60 tablet 5   atorvastatin  (LIPITOR) 20 MG tablet Take 1 tablet (20 mg total) by mouth daily.     Cyanocobalamin  (B-12 PO) Take 1 tablet by mouth daily.     ferrous sulfate  325 (65 FE) MG tablet Take 325 mg by mouth daily with breakfast.     irbesartan  (AVAPRO ) 300 MG tablet Take 1 tablet (300 mg total) by mouth daily. 90 tablet 3   levothyroxine  (SYNTHROID ) 125 MCG tablet Take 125 mcg by mouth every evening.     metFORMIN  (GLUCOPHAGE ) 850 MG tablet Take 850 mg by mouth daily.     metoprolol  succinate (TOPROL -XL) 25 MG 24 hr tablet Take 1 tablet (25 mg total) by mouth daily. 90 tablet 3   Multiple Vitamins-Minerals (MULTIVITAMIN WITH MINERALS) tablet Take 1 tablet by mouth in the morning.     oxyCODONE  (ROXICODONE ) 5 MG immediate release tablet Take 1 tablet  (5 mg total) by mouth every 6 (six) hours as needed. 10 tablet 0   timolol  (TIMOPTIC ) 0.5 % ophthalmic solution Place 1 drop into both eyes every morning.     TOUJEO  SOLOSTAR 300 UNIT/ML Solostar Pen Inject 10-12 Units into the skin See admin instructions. Inject 12 units into the skin in the morning and then inject 10 units at night     No current facility-administered medications for this visit.    PHYSICAL EXAMINATION: ECOG PERFORMANCE STATUS: 1 - Symptomatic but completely ambulatory  Vitals:   01/28/24 1206  BP: 120/75  Pulse: 73  Resp: 15  Temp: 98.2 F (36.8 C)  SpO2: 98%   Wt Readings from Last 3 Encounters:  01/28/24 199 lb (90.3 kg)  01/14/24 199 lb (90.3 kg)  01/08/24 199 lb (90.3 kg)     GENERAL:alert, no distress and comfortable SKIN: skin color, texture, turgor are normal, no rashes or significant lesions EYES: normal, Conjunctiva are pink and non-injected, sclera clear NECK: supple, thyroid  normal size, non-tender, without nodularity LYMPH:  no palpable lymphadenopathy in the cervical, axillary  LUNGS: clear to auscultation and percussion with normal breathing effort HEART: regular rate & rhythm and no murmurs and no lower extremity edema ABDOMEN:abdomen soft, non-tender and normal bowel sounds Musculoskeletal:no cyanosis of digits and no clubbing  NEURO: alert & oriented x 3 with fluent speech, no focal motor/sensory deficits  Physical Exam    LABORATORY DATA:  I have reviewed the data as listed    Latest Ref Rng & Units 01/08/2024    2:30 PM 08/17/2023    7:34 AM 08/16/2023    5:27 AM  CBC  WBC 4.0 - 10.5 K/uL 12.2  9.2  10.4   Hemoglobin 12.0 - 15.0 g/dL 86.2  86.1  87.5   Hematocrit 36.0 - 46.0 % 43.7  42.1  39.0   Platelets 150 - 400 K/uL 390  259  251         Latest Ref Rng & Units 01/08/2024    2:30 PM 08/17/2023    7:34 AM 08/16/2023    5:27 AM  CMP  Glucose 70 - 99 mg/dL 856  852  862   BUN 8 - 23 mg/dL 25  27  31    Creatinine 0.44 - 1.00  mg/dL 7.96  8.20  7.96   Sodium 135 - 145 mmol/L 135  138  137   Potassium 3.5 - 5.1 mmol/L 4.6  4.0  4.3   Chloride 98 - 111 mmol/L 104  106  106   CO2 22 - 32 mmol/L 21  21  23    Calcium  8.9 - 10.3 mg/dL 9.2  9.2  8.8   Total Protein 6.5 - 8.1 g/dL   6.4   Total Bilirubin 0.0 - 1.2 mg/dL   0.6   Alkaline Phos 38 - 126 U/L   80   AST 15 - 41 U/L   21   ALT 0 - 44 U/L   18       RADIOGRAPHIC STUDIES: I have personally reviewed the radiological images as listed and agreed with the findings in the report. No results found.    Orders Placed This Encounter  Procedures   Ambulatory referral to Genetics    Referral Priority:   Routine    Referral Type:   Consultation    Referral Reason:   Specialty Services Required    Number of Visits Requested:   1   All questions were answered. The patient knows to call the clinic with any problems, questions or concerns. No barriers to learning was detected. The total time spent in the appointment was 30 minutes, including review of chart and various tests results, discussions about plan of care and coordination of care plan     Onita Mattock, MD 01/28/2024

## 2024-01-28 NOTE — Assessment & Plan Note (Signed)
 pT1cN0M0, stage IA, G2, ER+/PR+/HER2-, Oncotype RS 8  12/10/2015: Left breast biopsy: Grade 1-2 IDC with calcifications with DCIS, ER 100%, PR 90%, Ki-67 5%, HER-2 negative ratio 1.45; left breast mass 1.9 x 1 x 1.1 cm; T1 CN 0 stage IA clinical stage 01/19/2016 Left lumpectomy: IDC with calcifications, 1.8 cms, grade 2, DCIS with calcifications, 0/1 lymph node negative, T1 CN 0 stage IA Oncotype DX score 8: Risk of recurrence 6% Patient refused radiation therapy She completed 5 years of adjuvant anastrozole .

## 2024-02-05 NOTE — Progress Notes (Signed)
 Location of Breast Cancer:right   Histology per Pathology Report:     Receptor Status: ER(80), PR (-), Her2-neu (), Ki-67(60)  Did patient present with symptoms (if so, please note symptoms) or was this found on screening mammography?:   Past/Anticipated interventions by surgeon, if any:  BREAST LUMPECTOMY WITH RADIOACTIVE SEED LOCALIZATION (Right: Breast)    Past/Anticipated interventions by medical oncology, if any:    Lymphedema issues, if any:   No   Skin: healing well.  Pain issues, if any:  yes, reports having left ear ache. Rating 8/10. Denies pain to breast.     SAFETY ISSUES: Prior radiation? no Pacemaker/ICD? Yes. Pacemaker  Possible current pregnancy?no Is the patient on methotrexate? no  Current Complaints / other details:   BP (!) 152/99 (BP Location: Left Arm, Patient Position: Sitting, Cuff Size: Large)   Pulse 79   Temp 97.8 F (36.6 C)   Resp 20   Ht 5' 4 (1.626 m)   Wt 199 lb 9.6 oz (90.5 kg)   LMP  (LMP Unknown)   SpO2 100%   BMI 34.26 kg/m

## 2024-02-09 NOTE — Progress Notes (Signed)
 Radiation Oncology         [phone] ________________________________  Name: Summer Hawkins MRN: [id number]  Date: 02/11/2024  DOB: 20-Jul-1934  Re-Evaluation Note  CC: Tisovec, Charlie ORN, MD  Lanny Callander, MD    ICD-10-CM   1. Malignant neoplasm of upper-inner quadrant of right breast in female, estrogen receptor positive (HCC)  C50.211    Z17.0       Diagnosis: Right Breast UIQ, Invasive Ductal Carcinoma, ER+ / PR- / Her2-, Grade 3: s/p right breast lumpectomy without nodal excisions    Cancer Staging  Breast cancer of upper-outer quadrant of left female breast (HCC) Staging form: Breast, AJCC 8th Edition - Pathologic: Stage IA (pT1c, pN0, cM0, G2, ER+, PR+, HER2-, Oncotype DX score: 8) - Unsigned  Malignant neoplasm of upper-inner quadrant of right breast in female, estrogen receptor positive (HCC) Staging form: Breast, AJCC 8th Edition - Clinical stage from 11/21/2023: Stage IIB (cT2, cN0, cM0, G3, ER+, PR-, HER2-) - Signed by Lanny Callander, MD on 12/07/2023 - Pathologic stage from 01/14/2024: Stage Unknown (pT2, pNX, G3, ER+, PR-, HER2-) - Signed by Lanny Callander, MD on 01/27/2024  Narrative:  The patient returns today to discuss radiation treatment options. She was seen in consultation on 12/12/23.   Since her consultation date, she opted to proceed with a right breast lumpectomy without nodal biopsies on 01/14/24 under the care of Dr. Curvin. Pathology from the procedure revealed: tumor the size of 2.5 cm; histology of grade 3 invasive ductal carcinoma; all margins negative for invasive carcinoma; margin status to invasive disease of 5 mm from the inferior margin; no lymph nodes were examined. Prognostic indicators (from prior biopsy specimen) significant for: estrogen receptor 80% positive with weak staining intensity; progesterone receptor 0% negative; Proliferation marker Ki67 at 60%; Her2 status negative; Grade 3.   She was most recently seen by Dr. Lanny on 11/17 to review her final  pathology and to discuss adjuvant treatment options. Chemotherapy is not recommended based on her age. Dr. Lanny has ultimately recommended that she proceed with antiestrogen therapy with anastrozole  which she has already began. Dr. Lanny has also recommended a referral to genetics for testing in light of her personal history of breast cancer.  On review of systems, the patient reports to be doing well overall today. She denies breast pain, swelling, issues with range of motion in her right shoulder and any other symptoms.    Allergies:  is allergic to bee venom, penicillins, lisinopril, zocor [simvastatin], and liraglutide.  Meds: Current Outpatient Medications  Medication Sig Dispense Refill   amLODipine  (NORVASC ) 5 MG tablet Take 1 tablet (5 mg total) by mouth every morning. 90 tablet 3   anastrozole  (ARIMIDEX ) 1 MG tablet Take 1 tablet (1 mg total) by mouth daily. 90 tablet 1   apixaban  (ELIQUIS ) 2.5 MG TABS tablet Take 1 tablet (2.5 mg total) by mouth 2 (two) times daily. 60 tablet 5   atorvastatin  (LIPITOR) 20 MG tablet Take 1 tablet (20 mg total) by mouth daily.     Cyanocobalamin  (B-12 PO) Take 1 tablet by mouth daily.     ferrous sulfate  325 (65 FE) MG tablet Take 325 mg by mouth daily with breakfast.     irbesartan  (AVAPRO ) 300 MG tablet Take 1 tablet (300 mg total) by mouth daily. 90 tablet 3   levothyroxine  (SYNTHROID ) 125 MCG tablet Take 125 mcg by mouth every evening.     metFORMIN  (GLUCOPHAGE ) 850 MG tablet Take 850 mg by mouth daily.  metoprolol  succinate (TOPROL -XL) 25 MG 24 hr tablet Take 1 tablet (25 mg total) by mouth daily. 90 tablet 3   Multiple Vitamins-Minerals (MULTIVITAMIN WITH MINERALS) tablet Take 1 tablet by mouth in the morning.     oxyCODONE  (ROXICODONE ) 5 MG immediate release tablet Take 1 tablet (5 mg total) by mouth every 6 (six) hours as needed. 10 tablet 0   timolol  (TIMOPTIC ) 0.5 % ophthalmic solution Place 1 drop into both eyes every morning.     TOUJEO   SOLOSTAR 300 UNIT/ML Solostar Pen Inject 10-12 Units into the skin See admin instructions. Inject 12 units into the skin in the morning and then inject 10 units at night     No current facility-administered medications for this encounter.    Physical Findings: The patient is in no acute distress. Patient is alert and oriented.  height is 5' 4 (1.626 m) and weight is 199 lb 9.6 oz (90.5 kg). Her temperature is 97.8 F (36.6 C). Her blood pressure is 152/99 (abnormal) and her pulse is 79. Her respiration is 20 and oxygen  saturation is 100%.    In general this is a well appearing female in no acute distress. She's alert and oriented x4 and appropriate throughout the examination. Cardiopulmonary assessment is negative for acute distress and she exhibits normal effort.     Right Breast: Surgical incision with well-approximated skin edges. Full ROM in right shoulder.    Lab Findings: Lab Results  Component Value Date   WBC 12.2 (H) 01/08/2024   HGB 13.7 01/08/2024   HCT 43.7 01/08/2024   MCV 93.8 01/08/2024   PLT 390 01/08/2024    Radiographic Findings: MM Breast Surgical Specimen Result Date: 01/15/2024 CLINICAL DATA:  Specimen radiograph. EXAM: SPECIMEN RADIOGRAPH OF THE RIGHT BREAST COMPARISON:  Previous exam(s). FINDINGS: Status post excision of the right breast. The radioactive seed and biopsy marker clip are present, completely intact, and were marked for pathology. IMPRESSION: Specimen radiograph of the right breast. Electronically Signed   By: Harrie Deis M.D.   On: 01/15/2024 08:24    Impression: Right Breast UIQ, Invasive Ductal Carcinoma, ER+ / PR- / Her2-, Grade 3: s/p right breast lumpectomy without nodal excisions   Patient continues to heal from her lumpectomy. Surgical pathology demonstrates an aggressive tumor with Ki-67 of 60% and ER weakly positive. Given high risk features, Dr. Shannon recommends hypofractionated radiation therapy in order to decrease her risk of  recurrent breast cancer in the right breast.    We discussed the option of standard hypofractionation (15 treatments) and ultrahypofractionation (5 treatments).  I reviewed the logistics, benefits, risks, and potential side effects of these treatments in detail. Risks may include but not necessary be limited to acute and late injury tissue in the radiation fields such as skin irritation (change in color/pigmentation, itching, dryness, pain, peeling). She may experience fatigue. We also discussed possible risk of long term cosmetic changes, swelling, or scar tissue that she is at a slightly increased risk for with ultra hypofractionated radiation therapy (given over 5 treatments) in comparison to traditional standard hypofractionated radiation therapy (given over 15 treatments).  The patient asked good questions which we answered to her satisfaction. She is enthusiastic about proceeding with treatment. A consent form has been signed and placed in her chart.  The patient understands that ultra hypofractionation is not as standard or quite as well studied, but clinical trials thus far have demonstrated promising results for patients with her type of breast cancer. She wishes to pursue this  technique because of her age and convenience.   Plan:  Patient is scheduled for CT simulation on 02/13/2024. Dr. Shannon anticipates 28.5 Gy in 5 fractions given once a week for five consecutive weeks directed at the right breast.   -----------------------------------   Leeroy Due, PA-C   Lynwood CHARM Shannon, PhD, MD   [facility]  Radiation Oncology Direct Dial : [phone]  Fax: [phone] [email or web address]    This document serves as a record of services personally performed by Lynwood Shannon, MD. It was created on his behalf by Dorthy Fuse, a trained medical scribe. The creation of this record is based on the scribe's personal observations and the provider's statements to them. This document has been checked and  approved by the attending provider.

## 2024-02-11 ENCOUNTER — Ambulatory Visit
Admission: RE | Admit: 2024-02-11 | Discharge: 2024-02-11 | Disposition: A | Source: Ambulatory Visit | Attending: Radiation Oncology | Admitting: Radiation Oncology

## 2024-02-11 ENCOUNTER — Encounter: Payer: Self-pay | Admitting: Radiation Oncology

## 2024-02-11 VITALS — BP 152/99 | HR 79 | Temp 97.8°F | Resp 20 | Ht 64.0 in | Wt 199.6 lb

## 2024-02-11 DIAGNOSIS — C50412 Malignant neoplasm of upper-outer quadrant of left female breast: Secondary | ICD-10-CM | POA: Diagnosis not present

## 2024-02-11 DIAGNOSIS — C50211 Malignant neoplasm of upper-inner quadrant of right female breast: Secondary | ICD-10-CM | POA: Insufficient documentation

## 2024-02-11 DIAGNOSIS — Z794 Long term (current) use of insulin: Secondary | ICD-10-CM | POA: Diagnosis not present

## 2024-02-11 DIAGNOSIS — Z79899 Other long term (current) drug therapy: Secondary | ICD-10-CM | POA: Insufficient documentation

## 2024-02-11 DIAGNOSIS — Z1732 Human epidermal growth factor receptor 2 negative status: Secondary | ICD-10-CM | POA: Insufficient documentation

## 2024-02-11 DIAGNOSIS — Z7984 Long term (current) use of oral hypoglycemic drugs: Secondary | ICD-10-CM | POA: Insufficient documentation

## 2024-02-11 DIAGNOSIS — Z7901 Long term (current) use of anticoagulants: Secondary | ICD-10-CM | POA: Insufficient documentation

## 2024-02-11 DIAGNOSIS — Z79811 Long term (current) use of aromatase inhibitors: Secondary | ICD-10-CM | POA: Insufficient documentation

## 2024-02-11 DIAGNOSIS — Z1722 Progesterone receptor negative status: Secondary | ICD-10-CM | POA: Diagnosis not present

## 2024-02-11 DIAGNOSIS — Z170 Estrogen receptor positive status [ER+]: Secondary | ICD-10-CM | POA: Diagnosis not present

## 2024-02-13 ENCOUNTER — Inpatient Hospital Stay

## 2024-02-13 ENCOUNTER — Ambulatory Visit
Admission: RE | Admit: 2024-02-13 | Discharge: 2024-02-13 | Disposition: A | Source: Ambulatory Visit | Attending: Radiation Oncology | Admitting: Radiation Oncology

## 2024-02-13 ENCOUNTER — Encounter: Payer: Self-pay | Admitting: Genetic Counselor

## 2024-02-13 ENCOUNTER — Inpatient Hospital Stay: Attending: Hematology | Admitting: Genetic Counselor

## 2024-02-13 DIAGNOSIS — Z510 Encounter for antineoplastic radiation therapy: Secondary | ICD-10-CM | POA: Diagnosis present

## 2024-02-13 DIAGNOSIS — Z170 Estrogen receptor positive status [ER+]: Secondary | ICD-10-CM | POA: Diagnosis present

## 2024-02-13 DIAGNOSIS — C50412 Malignant neoplasm of upper-outer quadrant of left female breast: Secondary | ICD-10-CM

## 2024-02-13 DIAGNOSIS — C50211 Malignant neoplasm of upper-inner quadrant of right female breast: Secondary | ICD-10-CM | POA: Diagnosis present

## 2024-02-13 DIAGNOSIS — Z803 Family history of malignant neoplasm of breast: Secondary | ICD-10-CM

## 2024-02-13 NOTE — Progress Notes (Signed)
 REFERRING PROVIDER: Lanny Callander, MD [address] [place],  [place] [address]  PRIMARY PROVIDER:  Tisovec, Charlie ORN, MD  PRIMARY REASON FOR VISIT:  1. Malignant neoplasm of upper-inner quadrant of right breast in female, estrogen receptor positive (HCC)   2. Malignant neoplasm of upper-outer quadrant of left breast in female, estrogen receptor positive (HCC)   3. Family history of malignant neoplasm of breast      HISTORY OF PRESENT ILLNESS:   Summer Hawkins, a 89 y.o. female, was seen for a [place] cancer genetics consultation at the request of Lanny Callander, MD due to a personal and family history of cancer.  Summer Hawkins presents to clinic today to discuss the possibility of a hereditary predisposition to cancer, to discuss genetic testing, and to further clarify her future cancer risks, as well as potential cancer risks for family members.   In 2017, at the age of 59, Summer Hawkins was diagnosed with left breast cancer, IDC ER+, PR+, Her2-, s/p lumpectomy and five years on anastrozole . In 2025, at the age of 33 Summer Hawkins was diagnosed with right breast cancer, IDC ER+, PR-, Her2-, s/p lumpectomy. Anastrozole  prescribed, radiation oncology consult later today.    CANCER HISTORY:  Oncology History  Breast cancer of upper-outer quadrant of left female breast (HCC)  12/15/2015 Initial Diagnosis   Left breast biopsy: Grade 1-2 IDC with calcifications with DCIS, ER 100%, PR 90%, Ki-67 5%, HER-2 negative ratio 1.45; left breast mass 1.9 x 1 x 1.1 cm; T1 CN 0 stage IA clinical stage   01/19/2016 Surgery   Left lumpectomy: IDC with calcifications, 1.8 cm, grade 2, DCIS with calcifications, 0/1 lymph node negative, T1 CN 0 stage IA   01/26/2016 Oncotype testing   Oncotype DX score 8: risk of recurrence 6%    Radiation Therapy   Refused radiation therapy   04/13/2016 -  Anti-estrogen oral therapy   Anastrozole  1 mg daily   Malignant neoplasm of upper-inner quadrant of right breast in  female, estrogen receptor positive (HCC)  11/21/2023 Cancer Staging   Staging form: Breast, AJCC 8th Edition - Clinical stage from 11/21/2023: Stage IIB (cT2, cN0, cM0, G3, ER+, PR-, HER2-) - Signed by Lanny Callander, MD on 12/07/2023 Stage prefix: Initial diagnosis Histologic grading system: 3 grade system   11/30/2023 Initial Diagnosis   Malignant neoplasm of upper-inner quadrant of right breast in female, estrogen receptor positive (HCC)   01/14/2024 Cancer Staging   Staging form: Breast, AJCC 8th Edition - Pathologic stage from 01/14/2024: Stage Unknown (pT2, pNX, G3, ER+, PR-, HER2-) - Signed by Lanny Callander, MD on 01/27/2024 Stage prefix: Initial diagnosis Histologic grading system: 3 grade system Residual tumor (R): R0      RELEVANT MEDICAL HISTORY:  Ovaries intact: yes.  Uterus intact: no.  Mammogram within the last year: yes.   Past Medical History:  Diagnosis Date   Atrial fibrillation (HCC)    Breast cancer (HCC)    Breast cancer, left (HCC)    Chronic kidney disease    stage III - patient was unaware   Diabetes mellitus    Dyspnea    Dysrhythmia    Afib   H/O: hysterectomy    Hardware complicating wound infection 08/28/2022   History of colonoscopy 01/25/2010   History of mammogram 08/04/2009   Hyperlipidemia    Hypertension    Hypothyroidism    Ketoacidosis, diabetic, no coma, non-insulin  dependent    Type II   Paralysis (HCC)    left leg  partially paralized   Presence of permanent cardiac pacemaker    Proteus infection 08/28/2022   Pruritus 07/10/2022   Vaccine counseling 12/31/2022   Vitamin B12 deficiency     Past Surgical History:  Procedure Laterality Date   ABDOMINAL HYSTERECTOMY     BACK SURGERY     Bilateral foot surgery     BREAST BIOPSY Right 11/21/2023   US  RT BREAST BX W LOC DEV 1ST LESION IMG BX SPEC US  GUIDE 11/21/2023 [facility]   BREAST BIOPSY  01/11/2024   US  RT RADIOACTIVE SEED LOC 01/11/2024 [facility]   BREAST EXCISIONAL  BIOPSY Left 12/2015   BREAST LUMPECTOMY Left    2017   BREAST LUMPECTOMY WITH RADIOACTIVE SEED AND SENTINEL LYMPH NODE BIOPSY Left 01/19/2016   Procedure: LEFT BREAST LUMPECTOMY WITH RADIOACTIVE SEED AND SENTINEL LYMPH NODE BIOPSY;  Surgeon: [name], MD;  Location: [facility] OR;  Service: General;  Laterality: Left;   BREAST LUMPECTOMY WITH RADIOACTIVE SEED LOCALIZATION Left 01/19/2016   BREAST LUMPECTOMY WITH RADIOACTIVE SEED LOCALIZATION Right 01/14/2024   Procedure: BREAST LUMPECTOMY WITH RADIOACTIVE SEED LOCALIZATION;  Surgeon: Null Deward MOULD, MD;  Location: [facility] OR;  Service: General;  Laterality: Right;  RIGHT BREAST RADIOACTIVE SEED LOCALIZED LUMPECTOMY   CARDIOVERSION  02/27/2011   Procedure: CARDIOVERSION;  Surgeon: Ezra Shuck, MD;  Location: [facility] OR;  Service: Cardiovascular;  Laterality: N/A;   INSERT / REPLACE / REMOVE PACEMAKER     LAPAROSCOPIC CHOLECYSTECTOMY     LUMBAR WOUND DEBRIDEMENT N/A 12/20/2020   Procedure: LUMBAR WOUND DEBRIDEMENT WITH PLACEMENTOF LUMBAR WOUND VAC;  Surgeon: Mavis Purchase, MD;  Location: [facility] OR;  Service: Neurosurgery;  Laterality: N/A;   PACEMAKER IMPLANT N/A 12/02/2018   Procedure: PACEMAKER IMPLANT;  Surgeon: Waddell Danelle ORN, MD;  Location: [facility] INVASIVE CV LAB;  Service: Cardiovascular;  Laterality: N/A;   ROTATOR CUFF REPAIR Left    TUBAL LIGATION     WOUND EXPLORATION N/A 01/19/2022   Procedure: REVISION OF LUMBAR WOUND;  Surgeon: Mavis Purchase, MD;  Location: [facility] OR;  Service: Neurosurgery;  Laterality: N/A;  3C    Social History   Socioeconomic History   Marital status: Widowed    Spouse name: Not on file   Number of children: 2   Years of education: Not on file   Highest education level: Not on file  Occupational History    Employer: OTHER    Comment: Worked at [employer]  Tobacco Use   Smoking status: Never    Passive exposure: Never   Smokeless tobacco: Never  Vaping Use   Vaping status: Never Used  Substance and Sexual Activity    Alcohol  use: No    Alcohol /week: 0.0 standard drinks of alcohol    Drug use: No   Sexual activity: Not on file  Other Topics Concern   Not on file  Social History Narrative   Patient since 45   Husband with prostate cancer   10-siblings-no cancer   Social Drivers of Corporate Investment Banker Strain: Not on file  Food Insecurity: No Food Insecurity (12/12/2023)   Hunger Vital Sign    Worried About Running Out of Food in the Last Year: Never true    Ran Out of Food in the Last Year: Never true  Transportation Needs: No Transportation Needs (12/12/2023)   PRAPARE - Administrator, Civil Service (Medical): No    Lack of Transportation (Non-Medical): No  Physical Activity: Not on file  Stress: Not on file  Social  Connections: Moderately Isolated (08/16/2023)   Social Connection and Isolation Panel    Frequency of Communication with Friends and Family: Once a week    Frequency of Social Gatherings with Friends and Family: Once a week    Attends Religious Services: More than 4 times per year    Active Member of [employer] or Organizations: Yes    Attends Banker Meetings: 1 to 4 times per year    Marital Status: Widowed     FAMILY HISTORY:  We obtained a detailed, 4-generation family history.  Significant diagnoses are listed below: Family History  Problem Relation Age of Onset   Pneumonia Mother 68   Diabetes Mother 36   Heart failure Father 57       enlarged heart   Breast cancer Sister 43   Breast cancer Sister 63   Cancer Son        lymphoma and leukemia   Breast cancer Niece 40   Bladder Cancer Nephew 95    Summer Hawkins is unaware of previous family history of genetic testing for hereditary cancer risks. There is no reported Ashkenazi Jewish ancestry.    GENETIC COUNSELING ASSESSMENT: Summer Hawkins is a 89 y.o. female with a personal and family history of cancer which is somewhat suggestive of a hereditary predisposition to cancer given her personal  history of bilateral breast cancer and family history of three close relatives diagnosed with breast cancer. We, therefore, discussed and recommended the following at today's visit.   DISCUSSION: We discussed that 5 - 10% of cancer is hereditary, with most cases of breast associated with pathogenic variants in BRCA1/2.  There are other genes that can be associated with hereditary breast cancer syndromes.  We discussed that testing is beneficial for several reasons including knowing how to follow individuals after completing their treatment, identifying whether potential treatment options would be beneficial, and understanding if other family members could be at risk for cancer and allowing them to undergo genetic testing.   We reviewed the characteristics, features and inheritance patterns of hereditary cancer syndromes. We also discussed genetic testing, including the appropriate family members to test, the process of testing, insurance coverage and turn-around-time for results. We discussed the implications of a negative, positive, carrier and/or variant of uncertain significant result. We recommended Summer Hawkins pursue genetic testing for a panel that includes genes associated with breast cancer.   Summer Hawkins  was offered a common hereditary cancer panel (40+ genes) and an expanded pan-cancer panel (70+ genes). Summer Hawkins was informed of the benefits and limitations of each panel, including that expanded pan-cancer panels contain genes that do not have clear management guidelines at this point in time.  We also discussed that as the number of genes included on a panel increases, the chances of variants of uncertain significance increases. After considering the benefits and limitations of each gene panel, Summer Hawkins elected to have [facility]'s Common Hereditary Cancer panel.  Based on Summer Hawkins's personal and family history of cancer, she meets medical criteria for genetic testing. Despite that she  meets criteria, she may still have an out of pocket cost. We discussed that if her out of pocket cost for testing is over $250, the laboratory should contact them to discuss self-pay prices, patient pay assistance programs, if applicable, and other billing options.  We discussed that some people do not want to undergo genetic testing due to fear of genetic discrimination.  A federal law called the Genetic Information Non-Discrimination Act (GINA)  of 2008 helps protect individuals against genetic discrimination based on their genetic test results.  It impacts both health insurance and employment.  With health insurance, it protects against increased premiums, being kicked off insurance or being forced to take a test in order to be insured.  For employment it protects against hiring, firing and promoting decisions based on genetic test results.  GINA does not apply to those in the [employer], those who work for companies with less than 15 employees, and new life insurance or long-term disability insurance policies.  Health status due to a cancer diagnosis is not protected under GINA.  PLAN: After considering the risks, benefits, and limitations, Summer Hawkins provided informed consent to pursue genetic testing through [facility] for analysis of the Common Hereditary cancers panel. Saliva kit collected in clinic due to Summer Hawkins's concerns with blood draw. Results should be available within approximately 2-3 weeks' time, at which point they will be disclosed by telephone to Summer Hawkins, as will any additional recommendations warranted by these results. Summer Hawkins will receive a summary of her genetic counseling visit and a copy of her results once available. This information will also be available in Epic.   Lastly, we encouraged Summer Hawkins to remain in contact with cancer genetics annually so that we can continuously update the family history and inform her of any changes in cancer genetics and testing  that may be of benefit for this family.   Ms. Berland questions were answered to her satisfaction today. Our contact information was provided should additional questions or concerns arise. Thank you for the referral and allowing us  to share in the care of your patient.   Burnard Ogren, MS, [facility] Licensed, [email or web address] phone: [phone]   60 minutes were spent on the date of the encounter in service to the patient including preparation, face-to-face consultation, documentation and care coordination.  The patient was seen alone.  Drs. Gudena and/or Lanny were available to discuss this case as needed.   _______________________________________________________________________ For Office Staff:  Number of people involved in session: 1 Was an Intern/ student involved with case: no

## 2024-02-15 ENCOUNTER — Telehealth: Payer: Self-pay | Admitting: Genetic Counselor

## 2024-02-15 NOTE — Telephone Encounter (Signed)
 Received notice from [facility] that patient had called to cancel germline genetic testing. Called to speak to patient to confirm accuracy, patient expressed confusion regarding testing and process. Reviewed purpose of testing. Summer Hawkins requested that we ask [facility] to re-initiate testing. Patient does not want to cancel.

## 2024-02-17 ENCOUNTER — Emergency Department (HOSPITAL_COMMUNITY)

## 2024-02-17 ENCOUNTER — Inpatient Hospital Stay (HOSPITAL_COMMUNITY)
Admission: EM | Admit: 2024-02-17 | Discharge: 2024-02-22 | DRG: 312 | Disposition: A | Discharge status: Skilled Nursing Facility | Attending: Family Medicine | Admitting: Family Medicine

## 2024-02-17 DIAGNOSIS — S069XAA Unspecified intracranial injury with loss of consciousness status unknown, initial encounter: Secondary | ICD-10-CM | POA: Diagnosis present

## 2024-02-17 DIAGNOSIS — E039 Hypothyroidism, unspecified: Secondary | ICD-10-CM | POA: Diagnosis present

## 2024-02-17 DIAGNOSIS — I517 Cardiomegaly: Secondary | ICD-10-CM | POA: Diagnosis not present

## 2024-02-17 DIAGNOSIS — Z950 Presence of cardiac pacemaker: Secondary | ICD-10-CM | POA: Diagnosis not present

## 2024-02-17 DIAGNOSIS — M542 Cervicalgia: Secondary | ICD-10-CM | POA: Diagnosis not present

## 2024-02-17 DIAGNOSIS — M47814 Spondylosis without myelopathy or radiculopathy, thoracic region: Secondary | ICD-10-CM | POA: Diagnosis not present

## 2024-02-17 DIAGNOSIS — I4821 Permanent atrial fibrillation: Secondary | ICD-10-CM | POA: Diagnosis present

## 2024-02-17 DIAGNOSIS — M1612 Unilateral primary osteoarthritis, left hip: Secondary | ICD-10-CM | POA: Diagnosis not present

## 2024-02-17 DIAGNOSIS — R739 Hyperglycemia, unspecified: Secondary | ICD-10-CM | POA: Diagnosis not present

## 2024-02-17 DIAGNOSIS — I10 Essential (primary) hypertension: Secondary | ICD-10-CM | POA: Diagnosis not present

## 2024-02-17 DIAGNOSIS — Z170 Estrogen receptor positive status [ER+]: Secondary | ICD-10-CM

## 2024-02-17 DIAGNOSIS — M5134 Other intervertebral disc degeneration, thoracic region: Secondary | ICD-10-CM | POA: Diagnosis not present

## 2024-02-17 DIAGNOSIS — M8580 Other specified disorders of bone density and structure, unspecified site: Secondary | ICD-10-CM | POA: Diagnosis not present

## 2024-02-17 DIAGNOSIS — G319 Degenerative disease of nervous system, unspecified: Secondary | ICD-10-CM | POA: Diagnosis not present

## 2024-02-17 DIAGNOSIS — W19XXXA Unspecified fall, initial encounter: Secondary | ICD-10-CM | POA: Diagnosis not present

## 2024-02-17 DIAGNOSIS — R52 Pain, unspecified: Secondary | ICD-10-CM | POA: Diagnosis present

## 2024-02-17 DIAGNOSIS — Z981 Arthrodesis status: Secondary | ICD-10-CM | POA: Diagnosis not present

## 2024-02-17 DIAGNOSIS — M549 Dorsalgia, unspecified: Secondary | ICD-10-CM

## 2024-02-17 DIAGNOSIS — S299XXA Unspecified injury of thorax, initial encounter: Secondary | ICD-10-CM | POA: Diagnosis not present

## 2024-02-17 DIAGNOSIS — E119 Type 2 diabetes mellitus without complications: Secondary | ICD-10-CM

## 2024-02-17 DIAGNOSIS — N184 Chronic kidney disease, stage 4 (severe): Secondary | ICD-10-CM | POA: Diagnosis present

## 2024-02-17 DIAGNOSIS — M545 Low back pain, unspecified: Secondary | ICD-10-CM | POA: Diagnosis not present

## 2024-02-17 DIAGNOSIS — S199XXA Unspecified injury of neck, initial encounter: Secondary | ICD-10-CM | POA: Diagnosis not present

## 2024-02-17 DIAGNOSIS — M47816 Spondylosis without myelopathy or radiculopathy, lumbar region: Secondary | ICD-10-CM | POA: Diagnosis not present

## 2024-02-17 DIAGNOSIS — S0990XA Unspecified injury of head, initial encounter: Secondary | ICD-10-CM | POA: Diagnosis not present

## 2024-02-17 DIAGNOSIS — S3993XA Unspecified injury of pelvis, initial encounter: Secondary | ICD-10-CM | POA: Diagnosis not present

## 2024-02-17 LAB — I-STAT CHEM 8, ED
BUN: 41 mg/dL — ABNORMAL HIGH (ref 8–23)
Calcium, Ion: 1.12 mmol/L — ABNORMAL LOW (ref 1.15–1.40)
Chloride: 101 mmol/L (ref 98–111)
Creatinine, Ser: 2.2 mg/dL — ABNORMAL HIGH (ref 0.44–1.00)
Glucose, Bld: 314 mg/dL — ABNORMAL HIGH (ref 70–99)
HCT: 45 % (ref 36.0–46.0)
Hemoglobin: 15.3 g/dL — ABNORMAL HIGH (ref 12.0–15.0)
Potassium: 4.4 mmol/L (ref 3.5–5.1)
Sodium: 135 mmol/L (ref 135–145)
TCO2: 22 mmol/L (ref 22–32)

## 2024-02-17 LAB — I-STAT CG4 LACTIC ACID, ED: Lactic Acid, Venous: 3.2 mmol/L (ref 0.5–1.9)

## 2024-02-17 LAB — CBC
HCT: 42.4 % (ref 36.0–46.0)
Hemoglobin: 13.4 g/dL (ref 12.0–15.0)
MCH: 29.6 pg (ref 26.0–34.0)
MCHC: 31.6 g/dL (ref 30.0–36.0)
MCV: 93.6 fL (ref 80.0–100.0)
Platelets: 349 K/uL (ref 150–400)
RBC: 4.53 MIL/uL (ref 3.87–5.11)
RDW: 14.1 % (ref 11.5–15.5)
WBC: 10.6 K/uL — ABNORMAL HIGH (ref 4.0–10.5)
nRBC: 0 % (ref 0.0–0.2)

## 2024-02-17 LAB — COMPREHENSIVE METABOLIC PANEL WITH GFR
ALT: 14 U/L (ref 0–44)
AST: 19 U/L (ref 15–41)
Albumin: 3.8 g/dL (ref 3.5–5.0)
Alkaline Phosphatase: 115 U/L (ref 38–126)
Anion gap: 13 (ref 5–15)
BUN: 39 mg/dL — ABNORMAL HIGH (ref 8–23)
CO2: 22 mmol/L (ref 22–32)
Calcium: 9 mg/dL (ref 8.9–10.3)
Chloride: 99 mmol/L (ref 98–111)
Creatinine, Ser: 2.09 mg/dL — ABNORMAL HIGH (ref 0.44–1.00)
GFR, Estimated: 23 mL/min — ABNORMAL LOW (ref >60)
Glucose, Bld: 310 mg/dL — ABNORMAL HIGH (ref 70–99)
Potassium: 4.4 mmol/L (ref 3.5–5.1)
Sodium: 134 mmol/L — ABNORMAL LOW (ref 135–145)
Total Bilirubin: 0.8 mg/dL (ref 0.0–1.2)
Total Protein: 7.9 g/dL (ref 6.5–8.1)

## 2024-02-17 LAB — PROTIME-INR
INR: 1.1 (ref 0.8–1.2)
Prothrombin Time: 14.9 s (ref 11.4–15.2)

## 2024-02-17 LAB — SAMPLE TO BLOOD BANK

## 2024-02-17 LAB — ETHANOL: Alcohol, Ethyl (B): <15 mg/dL (ref <15)

## 2024-02-17 MED ORDER — FENTANYL CITRATE (PF) 50 MCG/ML IJ SOSY
50.0000 ug | PREFILLED_SYRINGE | Freq: Once | INTRAMUSCULAR | Status: AC
  Administered 2024-02-17: 50 ug via INTRAVENOUS
  Filled 2024-02-17: qty 1

## 2024-02-17 MED ORDER — SODIUM CHLORIDE 0.9 % IV BOLUS (SEPSIS)
1000.0000 mL | Freq: Once | INTRAVENOUS | Status: AC
  Administered 2024-02-18: 1000 mL via INTRAVENOUS

## 2024-02-17 MED ORDER — FENTANYL CITRATE (PF) 50 MCG/ML IJ SOSY
50.0000 ug | PREFILLED_SYRINGE | Freq: Once | INTRAMUSCULAR | Status: AC
  Administered 2024-02-18: 50 ug via INTRAVENOUS
  Filled 2024-02-17: qty 1

## 2024-02-17 NOTE — Progress Notes (Signed)
 Orthopedic Tech Progress Note Patient Details:  Summer Hawkins 29-Oct-1934 [id number]  Level II trauma, no ortho tech orders at this time.  Patient ID: Summer Hawkins, female   DOB: 11-07-1934, 89 y.o.   MRN: [id number]  Summer Hawkins 02/17/2024, 6:58 PM

## 2024-02-17 NOTE — ED Triage Notes (Signed)
 Pt bib ems from church after fall. Pt was sitting in rollator walker when the person pushing her collapsed, she fell backwards onto the rollator and hit her head on the ground. Denies LOC. Pt with back, neck and head pain. Pt on eliquis . VSS  with ems.

## 2024-02-17 NOTE — ED Provider Notes (Signed)
 [place] EMERGENCY DEPARTMENT AT [facility] Provider Note   CSN: [id number] Arrival date & time: 02/17/24  1755     Patient presents with: Summer Hawkins   Summer Hawkins is a 89 y.o. female.  {Add pertinent medical, surgical, social history, OB history to YEP:67052} The history is provided by the patient, the EMS personnel and medical records. No language interpreter was used.  Fall This is a new problem. The current episode started less than 1 hour ago. The problem has not changed since onset.Associated symptoms include chest pain, abdominal pain and headaches. Pertinent negatives include no shortness of breath. Nothing aggravates the symptoms. Nothing relieves the symptoms. She has tried nothing for the symptoms. The treatment provided no relief.       Prior to Admission medications   Medication Sig Start Date End Date Taking? Authorizing Provider  amLODipine  (NORVASC ) 5 MG tablet Take 1 tablet (5 mg total) by mouth every morning. 10/10/23   Conte, Tessa N, PA-C  anastrozole  (ARIMIDEX ) 1 MG tablet Take 1 tablet (1 mg total) by mouth daily. 01/28/24   Lanny Callander, MD  apixaban  (ELIQUIS ) 2.5 MG TABS tablet Take 1 tablet (2.5 mg total) by mouth 2 (two) times daily. 10/10/23   Lucien Orren SAILOR, PA-C  atorvastatin  (LIPITOR) 20 MG tablet Take 1 tablet (20 mg total) by mouth daily. 10/10/23   Lucien Orren SAILOR, PA-C  Cyanocobalamin  (B-12 PO) Take 1 tablet by mouth daily.    [provider]  ferrous sulfate  325 (65 FE) MG tablet Take 325 mg by mouth daily with breakfast.    [provider]  irbesartan  (AVAPRO ) 300 MG tablet Take 1 tablet (300 mg total) by mouth daily. 10/10/23   Conte, Tessa N, PA-C  levothyroxine  (SYNTHROID ) 125 MCG tablet Take 125 mcg by mouth every evening.    [provider]  metFORMIN  (GLUCOPHAGE ) 850 MG tablet Take 850 mg by mouth daily.    [provider]  metoprolol  succinate (TOPROL -XL) 25 MG 24 hr tablet Take 1 tablet (25 mg total) by  mouth daily. 10/10/23   Conte, Tessa N, PA-C  Multiple Vitamins-Minerals (MULTIVITAMIN WITH MINERALS) tablet Take 1 tablet by mouth in the morning.    [provider]  oxyCODONE  (ROXICODONE ) 5 MG immediate release tablet Take 1 tablet (5 mg total) by mouth every 6 (six) hours as needed. 01/14/24 01/13/25  Curvin Mt III, MD  timolol  (TIMOPTIC ) 0.5 % ophthalmic solution Place 1 drop into both eyes every morning. 05/22/20   [provider]  TOUJEO  SOLOSTAR 300 UNIT/ML Solostar Pen Inject 10-12 Units into the skin See admin instructions. Inject 12 units into the skin in the morning and then inject 10 units at night 09/23/21   [provider]    Allergies: Bee venom, Penicillins, Lisinopril, Zocor [simvastatin], and Liraglutide    Review of Systems  Constitutional:  Negative for chills, fatigue and fever.  HENT:  Negative for congestion.   Eyes:  Negative for visual disturbance.  Respiratory:  Negative for cough, chest tightness, shortness of breath and wheezing.   Cardiovascular:  Positive for chest pain. Negative for palpitations.  Gastrointestinal:  Positive for abdominal pain. Negative for constipation, diarrhea, nausea and vomiting.  Genitourinary:  Negative for dysuria.  Musculoskeletal:  Positive for back pain and neck pain. Negative for neck stiffness.  Skin:  Negative for rash and wound.  Neurological:  Positive for weakness (at baseline), numbness (at baseline) and headaches. Negative for light-headedness.  Psychiatric/Behavioral:  Negative for agitation  and confusion.   All other systems reviewed and are negative.   Updated Vital Signs BP 130/68   Pulse 70   Temp 98 F (36.7 C)   LMP  (LMP Unknown)   SpO2 100%   Physical Exam Vitals and nursing note reviewed.  Constitutional:      General: She is not in acute distress.    Appearance: She is well-developed. She is not ill-appearing, toxic-appearing or diaphoretic.  HENT:     Head: Normocephalic and  atraumatic.     Nose: No congestion or rhinorrhea.     Mouth/Throat:     Mouth: Mucous membranes are moist.     Pharynx: No oropharyngeal exudate or posterior oropharyngeal erythema.  Eyes:     Extraocular Movements: Extraocular movements intact.     Conjunctiva/sclera: Conjunctivae normal.     Pupils: Pupils are equal, round, and reactive to light.  Neck:     Comments: In c collar Cardiovascular:     Rate and Rhythm: Normal rate and regular rhythm.     Heart sounds: No murmur heard. Pulmonary:     Effort: Pulmonary effort is normal. No respiratory distress.     Breath sounds: Normal breath sounds. No wheezing, rhonchi or rales.  Chest:     Chest wall: Tenderness present.  Abdominal:     General: Abdomen is flat.     Palpations: Abdomen is soft.     Tenderness: There is abdominal tenderness.  Musculoskeletal:        General: Tenderness present. No swelling.  Skin:    General: Skin is warm and dry.     Capillary Refill: Capillary refill takes less than 2 seconds.     Findings: No erythema or rash.  Neurological:     Mental Status: She is alert. Mental status is at baseline.     Sensory: Sensory deficit present.     Motor: Weakness present.  Psychiatric:        Mood and Affect: Mood normal.     (all labs ordered are listed, but only abnormal results are displayed) Labs Reviewed - No data to display  EKG: None  Radiology: No results found.  {Document cardiac monitor, telemetry assessment procedure when appropriate:32947} Procedures   Medications Ordered in the ED - No data to display    {Click here for ABCD2, HEART and other calculators REFRESH Note before signing:1}                              Medical Decision Making   Summer Hawkins is a 89 y.o. female with a past medical history significant for atrial fibrillation on Eliquis  therapy, hypertension, hyperlipidemia, CKD, previous breast cancer, and left leg paralysis from previous surgery who presents with  fall.  According to patient, she was in a rollator walker being pushed when someone collapsed behind her and she fell backwards landing on the rollator and then pavement.  She reports her head did bounce in the ground and she has some headache.  She is also complaining of significant pain in her mid and lower back where she has had surgery before.  She also reports pain across her lower chest and her upper abdomen.  She denies nausea, vomiting and denies loss of consciousness.  She reports she was feeling well before the fall.  Denies new pain in her extremities.  She reports she has baseline numbness and weakness to her left leg and is unchanged from  baseline.  Denies any other neurologic complaints.  On exam, airway is intact on arrival.  Lungs were clear.  Patient does have tenderness across her chest and upper abdomen.  She has tenderness across her back and has no laceration to her head.  Pupils symmetric and reactive with normal extraocular moods.  Symmetric smile.  Clear speech.  Symmetric grip strength.  No tenderness to the upper extremities.  Due to her blood thinner use and fall we agreed to get CT imaging of her head, neck and chest/abdomen/pelvis with no charge imaging of the T and L-spine  as well given her previous back surgery and the worsened back pain.  Will get portable image of the chest and pelvis.  Anticipate reassessment after workup to determine disposition.     {Document critical care time when appropriate  Document review of labs and clinical decision tools ie CHADS2VASC2, etc  Document your independent review of radiology images and any outside records  Document your discussion with family members, caretakers and with consultants  Document social determinants of health affecting pt's care  Document your decision making why or why not admission, treatments were needed:32947:::1}   Final diagnoses:  None    ED Discharge Orders     None

## 2024-02-17 NOTE — ED Notes (Signed)
 Pt transported to CT on portable monitor with primary RN at bedside.

## 2024-02-18 ENCOUNTER — Telehealth: Payer: Self-pay | Admitting: *Deleted

## 2024-02-18 ENCOUNTER — Encounter (HOSPITAL_COMMUNITY): Payer: Self-pay | Admitting: Family Medicine

## 2024-02-18 ENCOUNTER — Other Ambulatory Visit: Payer: Self-pay

## 2024-02-18 DIAGNOSIS — Z794 Long term (current) use of insulin: Secondary | ICD-10-CM | POA: Diagnosis not present

## 2024-02-18 DIAGNOSIS — C50211 Malignant neoplasm of upper-inner quadrant of right female breast: Secondary | ICD-10-CM

## 2024-02-18 DIAGNOSIS — R443 Hallucinations, unspecified: Secondary | ICD-10-CM | POA: Diagnosis not present

## 2024-02-18 DIAGNOSIS — Z833 Family history of diabetes mellitus: Secondary | ICD-10-CM | POA: Diagnosis not present

## 2024-02-18 DIAGNOSIS — I6521 Occlusion and stenosis of right carotid artery: Secondary | ICD-10-CM | POA: Diagnosis present

## 2024-02-18 DIAGNOSIS — N184 Chronic kidney disease, stage 4 (severe): Secondary | ICD-10-CM | POA: Diagnosis not present

## 2024-02-18 DIAGNOSIS — S069X0A Unspecified intracranial injury without loss of consciousness, initial encounter: Secondary | ICD-10-CM

## 2024-02-18 DIAGNOSIS — Z79811 Long term (current) use of aromatase inhibitors: Secondary | ICD-10-CM | POA: Diagnosis not present

## 2024-02-18 DIAGNOSIS — W19XXXA Unspecified fall, initial encounter: Secondary | ICD-10-CM | POA: Diagnosis present

## 2024-02-18 DIAGNOSIS — Z7984 Long term (current) use of oral hypoglycemic drugs: Secondary | ICD-10-CM | POA: Diagnosis not present

## 2024-02-18 DIAGNOSIS — S069XAA Unspecified intracranial injury with loss of consciousness status unknown, initial encounter: Secondary | ICD-10-CM | POA: Diagnosis present

## 2024-02-18 DIAGNOSIS — E119 Type 2 diabetes mellitus without complications: Secondary | ICD-10-CM | POA: Diagnosis not present

## 2024-02-18 DIAGNOSIS — M549 Dorsalgia, unspecified: Secondary | ICD-10-CM | POA: Diagnosis present

## 2024-02-18 DIAGNOSIS — Z170 Estrogen receptor positive status [ER+]: Secondary | ICD-10-CM | POA: Diagnosis not present

## 2024-02-18 DIAGNOSIS — I129 Hypertensive chronic kidney disease with stage 1 through stage 4 chronic kidney disease, or unspecified chronic kidney disease: Secondary | ICD-10-CM | POA: Diagnosis present

## 2024-02-18 DIAGNOSIS — Z79890 Hormone replacement therapy (postmenopausal): Secondary | ICD-10-CM | POA: Diagnosis not present

## 2024-02-18 DIAGNOSIS — I10 Essential (primary) hypertension: Secondary | ICD-10-CM

## 2024-02-18 DIAGNOSIS — R55 Syncope and collapse: Secondary | ICD-10-CM | POA: Diagnosis not present

## 2024-02-18 DIAGNOSIS — E785 Hyperlipidemia, unspecified: Secondary | ICD-10-CM | POA: Diagnosis present

## 2024-02-18 DIAGNOSIS — R296 Repeated falls: Secondary | ICD-10-CM | POA: Diagnosis present

## 2024-02-18 DIAGNOSIS — R52 Pain, unspecified: Secondary | ICD-10-CM | POA: Diagnosis not present

## 2024-02-18 DIAGNOSIS — I4821 Permanent atrial fibrillation: Secondary | ICD-10-CM | POA: Diagnosis not present

## 2024-02-18 DIAGNOSIS — Z950 Presence of cardiac pacemaker: Secondary | ICD-10-CM | POA: Diagnosis not present

## 2024-02-18 DIAGNOSIS — Z66 Do not resuscitate: Secondary | ICD-10-CM | POA: Diagnosis present

## 2024-02-18 DIAGNOSIS — G8929 Other chronic pain: Secondary | ICD-10-CM | POA: Diagnosis present

## 2024-02-18 DIAGNOSIS — I951 Orthostatic hypotension: Secondary | ICD-10-CM | POA: Diagnosis present

## 2024-02-18 DIAGNOSIS — E039 Hypothyroidism, unspecified: Secondary | ICD-10-CM

## 2024-02-18 DIAGNOSIS — E1122 Type 2 diabetes mellitus with diabetic chronic kidney disease: Secondary | ICD-10-CM | POA: Diagnosis present

## 2024-02-18 DIAGNOSIS — G8314 Monoplegia of lower limb affecting left nondominant side: Secondary | ICD-10-CM | POA: Diagnosis present

## 2024-02-18 DIAGNOSIS — Z7901 Long term (current) use of anticoagulants: Secondary | ICD-10-CM | POA: Diagnosis not present

## 2024-02-18 DIAGNOSIS — Z8249 Family history of ischemic heart disease and other diseases of the circulatory system: Secondary | ICD-10-CM | POA: Diagnosis not present

## 2024-02-18 LAB — BASIC METABOLIC PANEL WITH GFR
Anion gap: 11 (ref 5–15)
BUN: 33 mg/dL — ABNORMAL HIGH (ref 8–23)
CO2: 21 mmol/L — ABNORMAL LOW (ref 22–32)
Calcium: 8.8 mg/dL — ABNORMAL LOW (ref 8.9–10.3)
Chloride: 106 mmol/L (ref 98–111)
Creatinine, Ser: 1.72 mg/dL — ABNORMAL HIGH (ref 0.44–1.00)
GFR, Estimated: 28 mL/min — ABNORMAL LOW (ref >60)
Glucose, Bld: 154 mg/dL — ABNORMAL HIGH (ref 70–99)
Potassium: 4.3 mmol/L (ref 3.5–5.1)
Sodium: 138 mmol/L (ref 135–145)

## 2024-02-18 LAB — CBG MONITORING, ED
Glucose-Capillary: 133 mg/dL — ABNORMAL HIGH (ref 70–99)
Glucose-Capillary: 197 mg/dL — ABNORMAL HIGH (ref 70–99)

## 2024-02-18 LAB — LACTIC ACID, PLASMA: Lactic Acid, Venous: 1.3 mmol/L (ref 0.5–1.9)

## 2024-02-18 LAB — CBC
HCT: 39.5 % (ref 36.0–46.0)
Hemoglobin: 12.7 g/dL (ref 12.0–15.0)
MCH: 29.6 pg (ref 26.0–34.0)
MCHC: 32.2 g/dL (ref 30.0–36.0)
MCV: 92.1 fL (ref 80.0–100.0)
Platelets: 315 K/uL (ref 150–400)
RBC: 4.29 MIL/uL (ref 3.87–5.11)
RDW: 14 % (ref 11.5–15.5)
WBC: 11.1 K/uL — ABNORMAL HIGH (ref 4.0–10.5)
nRBC: 0 % (ref 0.0–0.2)

## 2024-02-18 LAB — GLUCOSE, CAPILLARY
Glucose-Capillary: 172 mg/dL — ABNORMAL HIGH (ref 70–99)
Glucose-Capillary: 177 mg/dL — ABNORMAL HIGH (ref 70–99)

## 2024-02-18 MED ORDER — ATORVASTATIN CALCIUM 10 MG PO TABS
20.0000 mg | ORAL_TABLET | Freq: Every day | ORAL | Status: DC
  Administered 2024-02-18 – 2024-02-22 (×5): 20 mg via ORAL
  Filled 2024-02-18 (×5): qty 2

## 2024-02-18 MED ORDER — METOPROLOL SUCCINATE ER 25 MG PO TB24
25.0000 mg | ORAL_TABLET | Freq: Every day | ORAL | Status: DC
  Administered 2024-02-18 – 2024-02-22 (×5): 25 mg via ORAL
  Filled 2024-02-18 (×5): qty 1

## 2024-02-18 MED ORDER — CYCLOBENZAPRINE HCL 5 MG PO TABS
5.0000 mg | ORAL_TABLET | Freq: Three times a day (TID) | ORAL | Status: AC | PRN
  Administered 2024-02-18 – 2024-02-22 (×7): 5 mg via ORAL
  Filled 2024-02-18 (×7): qty 1

## 2024-02-18 MED ORDER — SENNA 8.6 MG PO TABS: 1.0000 | ORAL_TABLET | Freq: Every day | ORAL | Status: DC | PRN

## 2024-02-18 MED ORDER — ACETAMINOPHEN 650 MG RE SUPP: 650.0000 mg | Freq: Four times a day (QID) | RECTAL | Status: DC | PRN

## 2024-02-18 MED ORDER — OXYCODONE HCL 5 MG PO TABS
5.0000 mg | ORAL_TABLET | ORAL | Status: DC | PRN
  Administered 2024-02-18 – 2024-02-21 (×2): 5 mg via ORAL
  Filled 2024-02-18 (×3): qty 1

## 2024-02-18 MED ORDER — SODIUM CHLORIDE 0.9% FLUSH
3.0000 mL | Freq: Two times a day (BID) | INTRAVENOUS | Status: DC
  Administered 2024-02-18 – 2024-02-21 (×8): 3 mL via INTRAVENOUS

## 2024-02-18 MED ORDER — APIXABAN 2.5 MG PO TABS
2.5000 mg | ORAL_TABLET | Freq: Two times a day (BID) | ORAL | Status: DC
  Administered 2024-02-18 – 2024-02-22 (×9): 2.5 mg via ORAL
  Filled 2024-02-18 (×9): qty 1

## 2024-02-18 MED ORDER — ANASTROZOLE 1 MG PO TABS: 1.0000 mg | ORAL_TABLET | Freq: Every day | ORAL | Status: DC

## 2024-02-18 MED ORDER — INSULIN ASPART 100 UNIT/ML IJ SOLN
0.0000 [IU] | Freq: Every day | INTRAMUSCULAR | Status: DC
  Administered 2024-02-20: 2 [IU] via SUBCUTANEOUS
  Filled 2024-02-18: qty 1

## 2024-02-18 MED ORDER — LEVOTHYROXINE SODIUM 25 MCG PO TABS
125.0000 ug | ORAL_TABLET | Freq: Every day | ORAL | Status: AC
  Administered 2024-02-18 – 2024-02-22 (×5): 125 ug via ORAL
  Filled 2024-02-18 (×5): qty 1

## 2024-02-18 MED ORDER — AMLODIPINE BESYLATE 5 MG PO TABS
5.0000 mg | ORAL_TABLET | Freq: Every morning | ORAL | Status: DC
  Administered 2024-02-18: 5 mg via ORAL
  Filled 2024-02-18: qty 1

## 2024-02-18 MED ORDER — OXYCODONE HCL 5 MG PO TABS
5.0000 mg | ORAL_TABLET | ORAL | Status: DC | PRN
  Administered 2024-02-18: 5 mg via ORAL
  Filled 2024-02-18: qty 1

## 2024-02-18 MED ORDER — INSULIN ASPART 100 UNIT/ML IJ SOLN
0.0000 [IU] | Freq: Three times a day (TID) | INTRAMUSCULAR | Status: DC
  Administered 2024-02-18 – 2024-02-19 (×4): 1 [IU] via SUBCUTANEOUS
  Administered 2024-02-20: 2 [IU] via SUBCUTANEOUS
  Administered 2024-02-20: 3 [IU] via SUBCUTANEOUS
  Administered 2024-02-21 – 2024-02-22 (×5): 1 [IU] via SUBCUTANEOUS
  Filled 2024-02-18: qty 2
  Filled 2024-02-18 (×4): qty 1
  Filled 2024-02-18: qty 2
  Filled 2024-02-18: qty 1
  Filled 2024-02-18: qty 2
  Filled 2024-02-18 (×2): qty 1

## 2024-02-18 MED ORDER — FENTANYL CITRATE (PF) 50 MCG/ML IJ SOSY: 25.0000 ug | PREFILLED_SYRINGE | INTRAMUSCULAR | Status: DC | PRN

## 2024-02-18 MED ORDER — FENTANYL CITRATE (PF) 50 MCG/ML IJ SOSY
50.0000 ug | PREFILLED_SYRINGE | Freq: Once | INTRAMUSCULAR | Status: AC
  Administered 2024-02-18: 50 ug via INTRAVENOUS
  Filled 2024-02-18: qty 1

## 2024-02-18 MED ORDER — IRBESARTAN 300 MG PO TABS
300.0000 mg | ORAL_TABLET | Freq: Every day | ORAL | Status: DC
  Administered 2024-02-18 – 2024-02-22 (×5): 300 mg via ORAL
  Filled 2024-02-18 (×5): qty 1

## 2024-02-18 MED ORDER — ACETAMINOPHEN 325 MG PO TABS
650.0000 mg | ORAL_TABLET | Freq: Four times a day (QID) | ORAL | Status: DC | PRN
  Administered 2024-02-18: 650 mg via ORAL
  Filled 2024-02-18: qty 2

## 2024-02-18 MED ORDER — INSULIN GLARGINE 100 UNIT/ML ~~LOC~~ SOLN
10.0000 [IU] | Freq: Two times a day (BID) | SUBCUTANEOUS | Status: DC
  Administered 2024-02-18 – 2024-02-19 (×3): 10 [IU] via SUBCUTANEOUS
  Filled 2024-02-18 (×4): qty 0.1

## 2024-02-18 NOTE — H&P (Signed)
 History and Physical    Summer Hawkins FMW:[id number] DOB: [birth year] DOA: 02/17/2024  PCP: Vernadine Charlie ORN, MD   Patient coming from: Home   Chief Complaint: Fall with headache and pain in back, shoulders, and hips   HPI: Summer Hawkins is a 89 y.o. female with medical history significant for hypertension, type 2 diabetes mellitus, atrial fibrillation on Eliquis , CKD stage IV, symptomatic bradycardia with pacemaker, chronic, left leg weakness, and breast cancer who presents with headache and pain in her back, shoulders, and hips after a fall.  Patient was in her usual state of health and having an uneventful day when someone was pushing her while she sat on her rollator.  She believes that the wheels of her rollator hit a bump which caused her to fall onto asphalt.  She reports landing hard and hitting the back of her head.  She denies losing consciousness and denies any new focal numbness or weakness, but has severe increase in her chronic back pain as well as pain in the bilateral shoulders and hips.  ED Course: Upon arrival to the ED, patient is found to be afebrile and saturating well on room air with normal RR, normal HR, and stable BP.  Labs are most notable for creatinine 2.09, glucose 310, and lactic acid 3.2.  There are no acute findings on chest x-ray, plain radiographs of the pelvis, head CT, or CT of the cervical spine, thoracic spine, lumbar spine, chest, abdomen, or pelvis.  She was treated with a liter of saline and multiple doses of IV fentanyl  in the ED.  She continues to have severe pain with any movement and is feeling lightheaded and dizzy when she tries to get up.  Review of Systems:  All other systems reviewed and apart from HPI, are negative.  Past Medical History:  Diagnosis Date   Atrial fibrillation (HCC)    Breast cancer (HCC)    Breast cancer, left (HCC)    Chronic kidney disease    stage III - patient was unaware   Diabetes mellitus    Dyspnea     Dysrhythmia    Afib   H/O: hysterectomy    Hardware complicating wound infection 08/28/2022   History of colonoscopy 01/25/2010   History of mammogram 08/04/2009   Hyperlipidemia    Hypertension    Hypothyroidism    Ketoacidosis, diabetic, no coma, non-insulin  dependent    Type II   Paralysis (HCC)    left leg partially paralized   Presence of permanent cardiac pacemaker    Proteus infection 08/28/2022   Pruritus 07/10/2022   Vaccine counseling 12/31/2022   Vitamin B12 deficiency     Past Surgical History:  Procedure Laterality Date   ABDOMINAL HYSTERECTOMY     BACK SURGERY     Bilateral foot surgery     BREAST BIOPSY Right 11/21/2023   US  RT BREAST BX W LOC DEV 1ST LESION IMG BX SPEC US  GUIDE 11/21/2023 [facility]   BREAST BIOPSY  01/11/2024   US  RT RADIOACTIVE SEED LOC 01/11/2024 [facility]   BREAST EXCISIONAL BIOPSY Left 12/2015   BREAST LUMPECTOMY Left    2017   BREAST LUMPECTOMY WITH RADIOACTIVE SEED AND SENTINEL LYMPH NODE BIOPSY Left 01/19/2016   Procedure: LEFT BREAST LUMPECTOMY WITH RADIOACTIVE SEED AND SENTINEL LYMPH NODE BIOPSY;  Surgeon: [name], MD;  Location: [facility] OR;  Service: General;  Laterality: Left;   BREAST LUMPECTOMY WITH RADIOACTIVE SEED LOCALIZATION Left 01/19/2016   BREAST LUMPECTOMY WITH RADIOACTIVE  SEED LOCALIZATION Right 01/14/2024   Procedure: BREAST LUMPECTOMY WITH RADIOACTIVE SEED LOCALIZATION;  Surgeon: Curvin Deward MOULD, MD;  Location: [facility] OR;  Service: General;  Laterality: Right;  RIGHT BREAST RADIOACTIVE SEED LOCALIZED LUMPECTOMY   CARDIOVERSION  02/27/2011   Procedure: CARDIOVERSION;  Surgeon: Ezra Shuck, MD;  Location[facility];  Service: Cardiovascular;  Laterality: N/A;   INSERT / REPLACE / REMOVE PACEMAKER     LAPAROSCOPIC CHOLECYSTECTOMY     LUMBAR WOUND DEBRIDEMENT N/A 12/20/2020   Procedure: LUMBAR WOUND DEBRIDEMENT WITH PLACEMENTOF LUMBAR WOUND VAC;  Surgeon: Mavis Purchase, MD;  Location: [facility] OR;  Service:  Neurosurgery;  Laterality: N/A;   PACEMAKER IMPLANT N/A 12/02/2018   Procedure: PACEMAKER IMPLANT;  Surgeon: Waddell Danelle ORN, MD;  Location: [facility] INVASIVE CV LAB;  Service: Cardiovascular;  Laterality: N/A;   ROTATOR CUFF REPAIR Left    TUBAL LIGATION     WOUND EXPLORATION N/A 01/19/2022   Procedure: REVISION OF LUMBAR WOUND;  Surgeon: Mavis Purchase, MD;  Location: [facility] OR;  Service: Neurosurgery;  Laterality: N/A;  3C    Social History:   reports that she has never smoked. She has never been exposed to tobacco smoke. She has never used smokeless tobacco. She reports that she does not drink alcohol  and does not use drugs.  Allergies  Allergen Reactions   Bee Venom Shortness Of Breath, Nausea And Vomiting and Other (See Comments)    Makes the patient feel faint, also   Penicillins Anaphylaxis, Hives and Swelling   Lisinopril Cough   Zocor [Simvastatin] Other (See Comments)    memory changes   Liraglutide Rash and Other (See Comments)    Rash at injection site    Family History  Problem Relation Age of Onset   Pneumonia Mother 44   Diabetes Mother 34   Heart failure Father 42       enlarged heart   Breast cancer Sister 68   Breast cancer Sister 53   Cancer Son        lymphoma and leukemia   Breast cancer Niece 44   Bladder Cancer Nephew 70     Prior to Admission medications   Medication Sig Start Date End Date Taking? Authorizing Provider  amLODipine  (NORVASC ) 5 MG tablet Take 1 tablet (5 mg total) by mouth every morning. 10/10/23  Yes Conte, Tessa N, PA-C  apixaban  (ELIQUIS ) 2.5 MG TABS tablet Take 1 tablet (2.5 mg total) by mouth 2 (two) times daily. 10/10/23  Yes Conte, Tessa N, PA-C  atorvastatin  (LIPITOR) 20 MG tablet Take 1 tablet (20 mg total) by mouth daily. 10/10/23  Yes Conte, Tessa N, PA-C  Cyanocobalamin  (B-12 PO) Take 1 tablet by mouth daily.   Yes [provider]  ferrous sulfate  325 (65 FE) MG tablet Take 325 mg by mouth daily with breakfast.   Yes  [provider]  irbesartan  (AVAPRO ) 300 MG tablet Take 1 tablet (300 mg total) by mouth daily. 10/10/23  Yes Conte, Tessa N, PA-C  levothyroxine  (SYNTHROID ) 125 MCG tablet Take 125 mcg by mouth every evening.   Yes [provider]  metFORMIN  (GLUCOPHAGE ) 850 MG tablet Take 850 mg by mouth daily.   Yes [provider]  metoprolol  succinate (TOPROL -XL) 25 MG 24 hr tablet Take 1 tablet (25 mg total) by mouth daily. 10/10/23  Yes Conte, Tessa N, PA-C  Multiple Vitamins-Minerals (MULTIVITAMIN WITH MINERALS) tablet Take 1 tablet by mouth in the morning.   Yes [provider]  timolol  (TIMOPTIC ) 0.5 %  ophthalmic solution Place 1 drop into both eyes every morning. 05/22/20  Yes [provider]  TOUJEO  SOLOSTAR 300 UNIT/ML Solostar Pen Inject 10-12 Units into the skin See admin instructions. Inject 12 units into the skin in the morning and then inject 10 units at night 09/23/21  Yes [provider]  anastrozole  (ARIMIDEX ) 1 MG tablet Take 1 tablet (1 mg total) by mouth daily. Patient not taking: Reported on 02/18/2024 01/28/24   Lanny Callander, MD    Physical Exam: Vitals:   02/18/24 0245 02/18/24 0300 02/18/24 0315 02/18/24 0330  BP: 118/62 (!) 114/59 122/67 126/77  Pulse: 70 70 70 70  Resp: 12 15 16 17   Temp:      TempSrc:      SpO2: 98% 97% 98% 98%    Constitutional: NAD, calm  Eyes: PERTLA, lids and conjunctivae normal ENMT: Mucous membranes are moist. Posterior pharynx clear of any exudate or lesions.   Neck: supple, no masses  Respiratory: no wheezing, no crackles. No accessory muscle use.  Cardiovascular: S1 & S2 heard, regular rate and rhythm. No JVD. Abdomen: No tenderness, soft. Bowel sounds active.  Musculoskeletal: no clubbing / cyanosis. No joint deformity upper and lower extremities.   Skin: no significant rashes, lesions, ulcers. Warm, dry, well-perfused. Neurologic: CN 2-12 grossly intact. Moving all extremities. Alert and completely  oriented.  Psychiatric: Pleasant. Cooperative.    Labs and Imaging on Admission: I have personally reviewed following labs and imaging studies  CBC: Recent Labs  Lab 02/17/24 1817 02/17/24 1820  WBC 10.6*  --   HGB 13.4 15.3*  HCT 42.4 45.0  MCV 93.6  --   PLT 349  --    Basic Metabolic Panel: Recent Labs  Lab 02/17/24 1817 02/17/24 1820  NA 134* 135  K 4.4 4.4  CL 99 101  CO2 22  --   GLUCOSE 310* 314*  BUN 39* 41*  CREATININE 2.09* 2.20*  CALCIUM  9.0  --    GFR: Estimated Creatinine Clearance: 19.6 mL/min (A) (by C-G formula based on SCr of 2.2 mg/dL (H)). Liver Function Tests: Recent Labs  Lab 02/17/24 1817  AST 19  ALT 14  ALKPHOS 115  BILITOT 0.8  PROT 7.9  ALBUMIN 3.8   No results for input(s): LIPASE, AMYLASE in the last 168 hours. No results for input(s): AMMONIA in the last 168 hours. Coagulation Profile: Recent Labs  Lab 02/17/24 1817  INR 1.1   Cardiac Enzymes: No results for input(s): CKTOTAL, CKMB, CKMBINDEX, TROPONINI in the last 168 hours. BNP (last 3 results) No results for input(s): PROBNP in the last 8760 hours. HbA1C: No results for input(s): HGBA1C in the last 72 hours. CBG: No results for input(s): GLUCAP in the last 168 hours. Lipid Profile: No results for input(s): CHOL, HDL, LDLCALC, TRIG, CHOLHDL, LDLDIRECT in the last 72 hours. Thyroid  Function Tests: No results for input(s): TSH, T4TOTAL, FREET4, T3FREE, THYROIDAB in the last 72 hours. Anemia Panel: No results for input(s): VITAMINB12, FOLATE, FERRITIN, TIBC, IRON, RETICCTPCT in the last 72 hours. Urine analysis:    Component Value Date/Time   COLORURINE YELLOW 11/25/2020 1304   APPEARANCEUR HAZY (A) 11/25/2020 1304   LABSPEC 1.016 11/25/2020 1304   PHURINE 5.0 11/25/2020 1304   GLUCOSEU NEGATIVE 11/25/2020 1304   HGBUR NEGATIVE 11/25/2020 1304   BILIRUBINUR NEGATIVE 11/25/2020 1304   KETONESUR NEGATIVE  11/25/2020 1304   PROTEINUR 30 (A) 11/25/2020 1304   NITRITE NEGATIVE 11/25/2020 1304   LEUKOCYTESUR MODERATE (A) 11/25/2020 1304  Sepsis Labs: @LABRCNTIP (procalcitonin:4,lacticidven:4) )No results found for this or any previous visit (from the past 240 hours).   Radiological Exams on Admission: CT T-SPINE NO CHARGE Result Date: 02/17/2024 EXAM: CT THORACIC SPINE WITHOUT CONTRAST 02/17/2024 09:16:19 PM TECHNIQUE: CT of the thoracic spine was performed without the administration of intravenous contrast. Multiplanar reformatted images are provided for review. Automated exposure control, iterative reconstruction, and/or weight based adjustment of the mA/kV was utilized to reduce the radiation dose to as low as reasonably achievable. COMPARISON: None available. CLINICAL HISTORY: FINDINGS: BONES AND ALIGNMENT: Osseous structures are diffusely osteopenic. Mild thoracic dextrocurvature. Normal thoracic kyphosis. No listhesis. Normal vertebral body heights. No acute fracture of the thoracic spine. No suspicious bone lesion. DEGENERATIVE CHANGES: Changes of advanced degenerative disc disease with bridging disc osteophytes involving the vertebral bodies of T4-T11. Endplate remodeling and vacuum disc phenomenon at T11-12 in keeping with change of advanced degenerative disc disease. No high-grade canal stenosis. No high-grade neuroforaminal narrowing. SOFT TISSUES: No paraspinal fluid collection or inflammatory change identified. No acute abnormality. IMPRESSION: 1. No acute abnormality of the thoracic spine. 2. Advanced degenerative disc disease with bridging disc osteophytes involving T4-T11, with endplate remodeling and vacuum disc phenomenon at T11-12, without high-grade canal stenosis or neuroforaminal narrowing. 3. Diffuse osteopenia. Electronically signed by: Dorethia Molt MD 02/17/2024 09:38 PM EST RP Workstation: [id number]   CT L-SPINE NO CHARGE Result Date: 02/17/2024 EXAM: CT OF THE LUMBAR SPINE  WITHOUT CONTRAST 02/17/2024 09:16:19 PM TECHNIQUE: CT of the lumbar spine was performed without the administration of intravenous contrast. Multiplanar reformatted images are provided for review. Automated exposure control, iterative reconstruction, and/or weight based adjustment of the mA/kV was utilized to reduce the radiation dose to as low as reasonably achievable. COMPARISON: None available. CLINICAL HISTORY: FINDINGS: BONES AND ALIGNMENT: L3-L5 anterior and posterior lumbar fusion with instrumentation. Prosthetic lucencies surrounding the right L3 pedicle screw suggest motion at this level. No definite bridging callus identified involving the vertebral body right L3-4 facet joint. Left L3 hemilaminectomy. Bilateral L4 laminotomy. Mild straightening along the fused segments. Normal vertebral body heights. No acute fracture. No listhesis. No suspicious bone lesion. DEGENERATIVE CHANGES: Ankylosis of the facet joints bilaterally at L4-5. No high-grade canal stenosis. No high-grade neural foraminal narrowing. SOFT TISSUES: No acute abnormality. IMPRESSION: 1. No acute findings. 2. L3-L5 anterior and posterior lumbar fusion with instrumentation, with periprosthetic lucency surrounding the right L3 pedicle screw suggesting motion at this level, and no definite bridging callus identified involving the vertebral body right L3-4 facet joint. 3. Left L3 hemilaminectomy and bilateral L4 laminotomy. 4. Ankylosis of the facet joints bilaterally at L4-5. Electronically signed by: Dorethia Molt MD 02/17/2024 09:35 PM EST RP Workstation: [id number]   CT CHEST ABDOMEN PELVIS WO CONTRAST Result Date: 02/17/2024 EXAM: CT CHEST, ABDOMEN AND PELVIS WITHOUT CONTRAST 02/17/2024 09:16:19 PM TECHNIQUE: CT of the chest, abdomen and pelvis was performed without the administration of intravenous contrast. Multiplanar reformatted images are provided for review. Automated exposure control, iterative reconstruction, and/or weight based  adjustment of the mA/kV was utilized to reduce the radiation dose to as low as reasonably achievable. COMPARISON: Prior examination of 12/09/2014. CLINICAL HISTORY: Fall backwards, head injury, neck and back pain. FINDINGS: CHEST: MEDIASTINUM AND LYMPH NODES: Looks like a subclavian single lead pacemaker is in place, but its lead is within the right ventricle toward the basilar septum. Global cardiac size is at the upper limits of normal. Extensive multivessel coronary artery calcification. No pericardial effusion. The pulmonary arteries are of normal caliber.  Moderate atherosclerotic calcification within the thoracic aorta. No aortic aneurysm. The central airways are clear. No pathologic thoracic adenopathy. The esophagus is unremarkable. Visualized thyroid  is unremarkable. LUNGS AND PLEURA: No focal consolidation or pulmonary edema. No pleural effusion or pneumothorax. ABDOMEN AND PELVIS: LIVER: The liver is unremarkable. GALLBLADDER AND BILE DUCTS: Status post cholecystectomy. No biliary ductal dilatation. SPLEEN: No acute abnormality. PANCREAS: No acute abnormality. ADRENAL GLANDS: No acute abnormality. KIDNEYS, URETERS AND BLADDER: 10 mm exophytic lesion arising from the anterior interpolar region of the right kidney demonstrates a mean density of 71 Hounsfield units, compatible with a hyperdense cortical cyst. The kidneys are otherwise unremarkable. No stones in the kidneys or ureters. No hydronephrosis. No perinephric or periureteral stranding. Urinary bladder is unremarkable. GI AND BOWEL: Few scattered diverticula are seen within the sigmoid colon without superimposed acute inflammatory change. The appendix is normal. The stomach, small bowel, and large bowel are otherwise unremarkable. There is no bowel obstruction. REPRODUCTIVE ORGANS: Status post hysterectomy. No adnexal mass. PERITONEUM AND RETROPERITONEUM: No ascites. No free air. Tiny fat-containing umbilical hernia. VASCULATURE: Aorta is normal in  caliber. Iliac atherosclerotic calcification. No aortic aneurysm. ABDOMINAL AND PELVIS LYMPH NODES: No lymphadenopathy. BONES AND SOFT TISSUES: Soft tissue infiltration within the right breast with associated surgical clips noted, consistent with surgical changes of partial right breast resection noted on 01/14/2024. Advanced degenerative changes are seen within the thoracic spine. L3-L5 lumbar fusion with instrumentation has been performed. Advanced asymmetric degenerative changes are seen within the left hip. Osseous structures are diffusely osteopenic. No acute bone abnormality within the thorax, abdomen, and pelvis. No focal soft tissue abnormality in the abdomen and pelvis. IMPRESSION: 1. No acute abnormality of the chest, abdomen, and pelvis related to the fall with head, neck, and back pain. 2. Single-lead right ventricular pacemaker in place. Global cardiac size at the upper limits of normal. Extensive multivessel coronary artery calcification 3. Postsurgical changes of partial right breast resection with associated soft tissue infiltration and surgical clips. Electronically signed by: Dorethia Molt MD 02/17/2024 09:28 PM EST RP Workstation: [id number]   CT Head Wo Contrast Result Date: 02/17/2024 EXAM: CT HEAD AND CERVICAL SPINE 02/17/2024 07:58:18 PM TECHNIQUE: CT of the head and cervical spine was performed without the administration of intravenous contrast. Multiplanar reformatted images are provided for review. Automated exposure control, iterative reconstruction, and/or weight based adjustment of the mA/kV was utilized to reduce the radiation dose to as low as reasonably achievable. COMPARISON: None available. CLINICAL HISTORY: trauma trauma FINDINGS: CT HEAD BRAIN AND VENTRICLES: No acute intracranial hemorrhage. No mass effect or midline shift. No abnormal extra-axial fluid collection. No evidence of acute infarct. No hydrocephalus. Cerebral atrophy. ORBITS: No acute abnormality. SINUSES AND  MASTOIDS: No acute abnormality. SOFT TISSUES AND SKULL: No acute skull fracture. No acute soft tissue abnormality. CT CERVICAL SPINE BONES AND ALIGNMENT: No acute fracture or traumatic malalignment. DEGENERATIVE CHANGES: Multilevel degenerative changes greatest at C5-C6 where there is severe degenerative disc disease with disc height loss and endplate spurring. Also, facet/uncovertebral hypertrophy at this level with potentially severe foraminal stenosis. SOFT TISSUES: No prevertebral soft tissue swelling. IMPRESSION: 1. No acute intracranial abnormality. 2. No acute fracture or traumatic malalignment of the cervical spine. 3. Severe C5-C6 degenerative change. Electronically signed by: Gilmore Molt MD 02/17/2024 08:09 PM EST RP Workstation: [id number]   CT Cervical Spine Wo Contrast Result Date: 02/17/2024 EXAM: CT HEAD AND CERVICAL SPINE 02/17/2024 07:58:18 PM TECHNIQUE: CT of the head and cervical spine was performed without the  administration of intravenous contrast. Multiplanar reformatted images are provided for review. Automated exposure control, iterative reconstruction, and/or weight based adjustment of the mA/kV was utilized to reduce the radiation dose to as low as reasonably achievable. COMPARISON: None available. CLINICAL HISTORY: trauma trauma FINDINGS: CT HEAD BRAIN AND VENTRICLES: No acute intracranial hemorrhage. No mass effect or midline shift. No abnormal extra-axial fluid collection. No evidence of acute infarct. No hydrocephalus. Cerebral atrophy. ORBITS: No acute abnormality. SINUSES AND MASTOIDS: No acute abnormality. SOFT TISSUES AND SKULL: No acute skull fracture. No acute soft tissue abnormality. CT CERVICAL SPINE BONES AND ALIGNMENT: No acute fracture or traumatic malalignment. DEGENERATIVE CHANGES: Multilevel degenerative changes greatest at C5-C6 where there is severe degenerative disc disease with disc height loss and endplate spurring. Also, facet/uncovertebral hypertrophy at this  level with potentially severe foraminal stenosis. SOFT TISSUES: No prevertebral soft tissue swelling. IMPRESSION: 1. No acute intracranial abnormality. 2. No acute fracture or traumatic malalignment of the cervical spine. 3. Severe C5-C6 degenerative change. Electronically signed by: Gilmore Molt MD 02/17/2024 08:09 PM EST RP Workstation: [id number]   DG Chest Port 1 View Result Date: 02/17/2024 EXAM: 1 VIEW(S) XRAY OF THE CHEST 02/17/2024 06:39:00 PM COMPARISON: 12/23/2020 CLINICAL HISTORY: Trauma Trauma FINDINGS: LUNGS AND PLEURA: No focal pulmonary opacity. No pleural effusion. No pneumothorax. HEART AND MEDIASTINUM: Stable cardiomegaly. Left-sided pacemaker is unchanged. BONES AND SOFT TISSUES: No acute osseous abnormality. IMPRESSION: 1. No acute cardiopulmonary process. Stable cardiomegaly and unchanged left-sided pacemaker. Electronically signed by: Lynwood Seip MD 02/17/2024 06:44 PM EST RP Workstation: [id number]   DG Pelvis Portable Result Date: 02/17/2024 EXAM: 1 or 2 VIEW(S) XRAY OF THE PELVIS 02/17/2024 06:39:00 PM COMPARISON: None available. CLINICAL HISTORY: Trauma FINDINGS: BONES AND JOINTS: No acute fracture. No malalignment. Severe degenerative change is seen involving the left hip joint. SOFT TISSUES: The soft tissues are unremarkable. IMPRESSION: 1. No evidence of acute traumatic injury. 2. Severe left hip osteoarthritis. Electronically signed by: Lynwood Seip MD 02/17/2024 06:43 PM EST RP Workstation: [id number]    EKG: Independently reviewed. Paced rhythm.   Assessment/Plan   1. Intractable pain  - No acute findings on trauma imaging  - Continue pain-control    2. Mild TBI  - No acute findings on head CT   - Continue supportive care    3. Atrial fibrillation  - Continue Eliquis  and metoprolol     4. Type II DM - A1c was 7.0% in October 2025  - Check CBGs, continue basal insulin , add correctional sliding-scale for now    5. HTN - Continue Norvasc  and ARB   6. CKD  IV  - Appears close to baseline  - Renally-dose medications    7. Breast cancer  - Followed by Dr. Lanny, started on anastrozole  and scheduled to start radiation this week per patient     DVT prophylaxis: Eliquis   Code Status: Full  Level of Care: Level of care: Telemetry Family Communication: None present  Disposition Plan:  Patient is from: Home  Anticipated d/c is to: TBD Anticipated d/c date is: 12/9 or 02/20/24  Patient currently: Pending pain-control, disposition planning  Consults called: none  Admission status: Observation     Evalene GORMAN Sprinkles, MD [facility]  02/18/2024, 4:45 AM

## 2024-02-18 NOTE — ED Notes (Signed)
 Pt stood at bedside for a brief period, pt attempted to take a step using rolling walker and should I'm fainting pt returned to bed safely. MD aware.

## 2024-02-18 NOTE — Evaluation (Signed)
 Physical Therapy Evaluation Patient Details Name: Summer Hawkins MRN: [id number] DOB: 02/04/1935 Today's Date: 02/18/2024  History of Present Illness  89 yo F adm 12/7 s/p fall with pain back shoulders and hip Pt with workup for mild TBI. Pt currently with breast CA treatment.  PMH: breast cancer, CKD III, DMII, HTN, pacemaker, RC repair. Atrial fib on eliquis , back surgery with residual LLE weakness  Clinical Impression  Pt lives alone with some intermittent support of neighbors. Currently not quite back to baseline after fall with upper back soreness and being on the stretcher for 12 hours. Expect with another day and some more therapy and mobility pt will be able to return home at close to her baseline with HHPT.       If plan is discharge home, recommend the following: A little help with walking and/or transfers;A little help with bathing/dressing/bathroom;Assistance with cooking/housework;Assist for transportation;Help with stairs or ramp for entrance   Can travel by private vehicle        Equipment Recommendations None recommended by PT  Recommendations for Other Services       Functional Status Assessment Patient has had a recent decline in their functional status and demonstrates the ability to make significant improvements in function in a reasonable and predictable amount of time.     Precautions / Restrictions Precautions Precautions: Fall Restrictions Weight Bearing Restrictions Per Provider Order: No      Mobility  Bed Mobility Overal bed mobility: Needs Assistance Bed Mobility: Supine to Sit, Sit to Supine     Supine to sit: Mod assist Sit to supine: Mod assist   General bed mobility comments: Assist to elevate trunk into sitting and bring legs back up onto stretcher    Transfers Overall transfer level: Needs assistance Equipment used: Rollator (4 wheels) Transfers: Sit to/from Stand Sit to Stand: Contact guard assist, From elevated surface            General transfer comment: Coming off high stretcher pt needed CGA for safety    Ambulation/Gait Ambulation/Gait assistance: Contact guard assist Gait Distance (Feet): 40 Feet (x 2) Assistive device: Rollator (4 wheels) Gait Pattern/deviations: Step-to pattern, Decreased step length - left, Shuffle, Trendelenburg Gait velocity: decr Gait velocity interpretation: <1.31 ft/sec, indicative of household ambulator   General Gait Details: Assist for safety. Pt with dragging motion of LLE due to chronic weakness  Stairs            Wheelchair Mobility     Tilt Bed    Modified Rankin (Stroke Patients Only)       Balance Overall balance assessment: Needs assistance Sitting-balance support: No upper extremity supported, Feet supported Sitting balance-Leahy Scale: Fair     Standing balance support: Bilateral upper extremity supported, During functional activity, Single extremity supported Standing balance-Leahy Scale: Poor Standing balance comment: UE support and supervision for static standing                             Pertinent Vitals/Pain Pain Assessment Pain Assessment: Faces Faces Pain Scale: Hurts a little bit Pain Location: upper back/neck Pain Descriptors / Indicators: Sore, Spasm Pain Intervention(s): Limited activity within patient's tolerance, Repositioned    Home Living Family/patient expects to be discharged to:: Private residence Living Arrangements: Alone Available Help at Discharge: Friend(s) Type of Home: House Home Access: Ramped entrance       Home Layout: One level Home Equipment: Rollator (4 wheels);BSC/3in1;Shower seat;Grab bars - tub/shower;Hand held  shower head;Wheelchair - manual;Lift chair      Prior Function Prior Level of Function : Independent/Modified Independent;Needs assist       Physical Assist : ADLs (physical)   ADLs (physical): IADLs Mobility Comments: Modified independent with rollator        Extremity/Trunk Assessment   Upper Extremity Assessment Upper Extremity Assessment: Defer to OT evaluation    Lower Extremity Assessment Lower Extremity Assessment: Generalized weakness;LLE deficits/detail LLE Deficits / Details: chronic weakness from back surgery/problems       Communication   Communication Communication: Impaired Factors Affecting Communication: Hearing impaired    Cognition Arousal: Alert Behavior During Therapy: WFL for tasks assessed/performed   PT - Cognitive impairments: No apparent impairments                         Following commands: Intact       Cueing Cueing Techniques: Verbal cues     General Comments General comments (skin integrity, edema, etc.): VSS on RA    Exercises     Assessment/Plan    PT Assessment Patient needs continued PT services  PT Problem List Decreased strength;Decreased activity tolerance;Decreased balance;Decreased mobility;Pain       PT Treatment Interventions DME instruction;Gait training;Functional mobility training;Therapeutic activities;Therapeutic exercise;Balance training;Patient/family education    PT Goals (Current goals can be found in the Care Plan section)  Acute Rehab PT Goals Patient Stated Goal: return home when stronger PT Goal Formulation: With patient Time For Goal Achievement: 02/25/24 Potential to Achieve Goals: Good    Frequency Min 3X/week     Co-evaluation               AM-PAC PT 6 Clicks Mobility  Outcome Measure Help needed turning from your back to your side while in a flat bed without using bedrails?: A Little Help needed moving from lying on your back to sitting on the side of a flat bed without using bedrails?: A Lot Help needed moving to and from a bed to a chair (including a wheelchair)?: A Little Help needed standing up from a chair using your arms (e.g., wheelchair or bedside chair)?: A Little Help needed to walk in hospital room?: A Little Help  needed climbing 3-5 steps with a railing? : Total 6 Click Score: 15    End of Session Equipment Utilized During Treatment: Gait belt Activity Tolerance: Patient limited by fatigue Patient left: in bed;with call bell/phone within reach;with nursing/sitter in room Nurse Communication: Mobility status PT Visit Diagnosis: Unsteadiness on feet (R26.81);Other abnormalities of gait and mobility (R26.89);Muscle weakness (generalized) (M62.81);History of falling (Z91.81);Pain Pain - part of body:  (upper back)    Time: 8971-8954 PT Time Calculation (min) (ACUTE ONLY): 17 min   Charges:   PT Evaluation $PT Eval Moderate Complexity: 1 Mod   PT General Charges $$ ACUTE PT VISIT: 1 Visit         [facility] PT Acute Rehabilitation Services Office [phone]   Rodgers ORN [facility] 02/18/2024, 11:33 AM

## 2024-02-18 NOTE — ED Provider Notes (Signed)
 I assumed care at signout to reassess patient's pain.  In brief, patient presented after mechanical fall currently on Eliquis .  Patient with extensive imaging does not reveal any acute traumatic injuries However patient still has significant pain with standing. After IV analgesics, patient stood up with nursing staff and had an immediate pain in her lower abdomen and had a near syncopal episode EKG reveals paced rhythm,?  Prolonged QT Since we are unable to control her pain and patient is a fall risk, she will be admitted for further evaluation  On exam patient is awake and alert.  She reports chronic weakness to the left leg Pelvis stable, no signs of any lower extremity trauma. Mild diffuse abdominal tenderness.  ED ECG REPORT   Date: 02/18/2024 0220am  Rate: 70  Rhythm: paced rhythm  QRS Axis: normal  Intervals: QT prolonged  ST/T Wave abnormalities: normal  Conduction Disutrbances:none  Narrative Interpretation:   Old EKG Reviewed:qt appears more prolonged  I have personally reviewed the EKG tracing and agree with the computerized printout as noted.  Discussed the case with Dr. Charlton with [facility] hospitalist.  Patient will need to remain monitor, treat pain.  Would recommend repeat EKG   Midge Golas, MD 02/18/24 [phone]

## 2024-02-18 NOTE — Telephone Encounter (Signed)
 [place].  Was being pushed while sitting.  In er for possible tbi and hip pain.  Will follow for needs.

## 2024-02-18 NOTE — Evaluation (Signed)
 Occupational Therapy Evaluation Patient Details Name: Summer Hawkins MRN: [id number] DOB: November 22, 1934 Today's Date: 02/18/2024   History of Present Illness   89 yo F adm 12/7 s/p fall with pain back shoulders and hip Pt with workup for mild TBI. Pt currently with breast CA treatment.  PMH: breast cancer, CKD III, DMII, HTN, pacemaker, RC repair. Atrial fib on eliquis , back surgery with residual LLE weakness     Clinical Impressions Pt lives alone, walks with a rollator, drives and is modified independent in ADLs and light IADLs at baseline. Presents with upper back pain/spasms, generalized weakness and impaired standing balance. She needds CGA to stand from high surfaces, CGA from low and CGA for ambulation with rollator. Pt requires set up to moderate assistance in ED environment. Anticipate pt will progress well as soreness improves. Will follow acutely. Recommend HHOT upon discharge.     If plan is discharge home, recommend the following:   Assist for transportation;Help with stairs or ramp for entrance;Assistance with cooking/housework     Functional Status Assessment   Patient has had a recent decline in their functional status and demonstrates the ability to make significant improvements in function in a reasonable and predictable amount of time.     Equipment Recommendations   None recommended by OT     Recommendations for Other Services         Precautions/Restrictions   Precautions Precautions: Fall Recall of Precautions/Restrictions: Intact Restrictions Weight Bearing Restrictions Per Provider Order: No     Mobility Bed Mobility Overal bed mobility: Needs Assistance Bed Mobility: Sit to Supine       Sit to supine: Mod assist   General bed mobility comments: assist for LEs back onto stretcher    Transfers Overall transfer level: Needs assistance Equipment used: Rollator (4 wheels) Transfers: Sit to/from Stand Sit to Stand: Mod assist            General transfer comment: mod from low toilet      Balance Overall balance assessment: Needs assistance   Sitting balance-Leahy Scale: Fair     Standing balance support: Bilateral upper extremity supported, During functional activity, Single extremity supported Standing balance-Leahy Scale: Poor Standing balance comment: flexed posture at sink during hand washing, no LOB                           ADL either performed or assessed with clinical judgement   ADL Overall ADL's : Needs assistance/impaired Eating/Feeding: Independent;Bed level   Grooming: Minimal assistance;Standing;Wash/dry hands   Upper Body Bathing: Set up;Sitting   Lower Body Bathing: Moderate assistance;Sit to/from stand   Upper Body Dressing : Set up;Sitting   Lower Body Dressing: Moderate assistance;Sit to/from stand   Toilet Transfer: Rollator (4 wheels);Moderate assistance;Regular Teacher, Adult Education Details (indicate cue type and reason): assist to rise from low toilet                 Vision Baseline Vision/History: 1 Wears glasses Ability to See in Adequate Light: 0 Adequate Patient Visual Report: No change from baseline       Perception         Praxis         Pertinent Vitals/Pain Pain Assessment Pain Assessment: Faces Faces Pain Scale: Hurts a little bit Pain Location: upper back/neck Pain Descriptors / Indicators: Sore, Spasm Pain Intervention(s): Monitored during session, Repositioned     Extremity/Trunk Assessment Upper Extremity Assessment Upper Extremity Assessment: Generalized weakness;Right hand  dominant   Lower Extremity Assessment Lower Extremity Assessment: Defer to PT evaluation LLE Deficits / Details: chronic weakness from back surgery/problems   Cervical / Trunk Assessment Cervical / Trunk Assessment: Kyphotic   Communication Communication Communication: Impaired Factors Affecting Communication: Hearing impaired   Cognition Arousal:  Alert Behavior During Therapy: WFL for tasks assessed/performed Cognition: No apparent impairments                               Following commands: Intact       Cueing  General Comments   Cueing Techniques: Verbal cues  VSS on RA   Exercises     Shoulder Instructions      Home Living Family/patient expects to be discharged to:: Private residence Living Arrangements: Alone Available Help at Discharge: Friend(s);Available PRN/intermittently Type of Home: House Home Access: Ramped entrance     Home Layout: One level     Bathroom Shower/Tub: Producer, Television/film/video: Standard (3 in 1 over)     Home Equipment: Rollator (4 wheels);BSC/3in1;Shower seat;Grab bars - tub/shower;Hand held shower head;Wheelchair - manual;Lift chair          Prior Functioning/Environment Prior Level of Function : Needs assist;Driving       Physical Assist : ADLs (physical)   ADLs (physical): IADLs Mobility Comments: Modified independent with rollator ADLs Comments: Mod I for ADL and light iADL.  manages her own meds, neighbors help with putting out trash, heavier IADLs    OT Problem List: Decreased strength;Impaired balance (sitting and/or standing);Pain   OT Treatment/Interventions: Self-care/ADL training;DME and/or AE instruction;Therapeutic activities;Patient/family education;Balance training      OT Goals(Current goals can be found in the care plan section)   Acute Rehab OT Goals OT Goal Formulation: With patient Time For Goal Achievement: 03/03/24 Potential to Achieve Goals: Good ADL Goals Pt Will Perform Grooming: with modified independence;standing Pt Will Perform Lower Body Bathing: with modified independence;sit to/from stand Pt Will Perform Lower Body Dressing: with modified independence;sit to/from stand Pt Will Transfer to Toilet: with modified independence;ambulating;bedside commode (over toilet) Pt Will Perform Toileting - Clothing  Manipulation and hygiene: with modified independence;sit to/from stand Additional ADL Goal #1: Pt will be aware of AE for LB bathing and dressing. Additional ADL Goal #2: Pt will complete bed mobility mod I in preparation for ADLs.   OT Frequency:  Min 2X/week    Co-evaluation              AM-PAC OT 6 Clicks Daily Activity     Outcome Measure Help from another person eating meals?: None Help from another person taking care of personal grooming?: A Little Help from another person toileting, which includes using toliet, bedpan, or urinal?: A Lot Help from another person bathing (including washing, rinsing, drying)?: A Lot Help from another person to put on and taking off regular upper body clothing?: A Little Help from another person to put on and taking off regular lower body clothing?: A Lot 6 Click Score: 16   End of Session Equipment Utilized During Treatment: Gait belt;Rollator (4 wheels)  Activity Tolerance: Patient tolerated treatment well Patient left: in bed;with call bell/phone within reach  OT Visit Diagnosis: Unsteadiness on feet (R26.81);Other abnormalities of gait and mobility (R26.89);Pain;Muscle weakness (generalized) (M62.81)                Time: 8954-8895 OT Time Calculation (min): 19 min Charges:  OT General Charges $OT Visit: 1  Visit OT Evaluation $OT Eval Moderate Complexity: 1 Mod  Summer Hawkins, OTR/L Acute Rehabilitation Services Office: [phone]   Kennth Summer Helling 02/18/2024, 11:55 AM

## 2024-02-18 NOTE — Plan of Care (Signed)
  Problem: Education: Goal: Ability to describe self-care measures that may prevent or decrease complications (Diabetes Survival Skills Education) will improve Outcome: Progressing   Problem: Skin Integrity: Goal: Risk for impaired skin integrity will decrease Outcome: Progressing   Problem: Activity: Goal: Risk for activity intolerance will decrease Outcome: Progressing

## 2024-02-19 ENCOUNTER — Other Ambulatory Visit: Payer: Self-pay

## 2024-02-19 ENCOUNTER — Encounter: Payer: Self-pay | Admitting: *Deleted

## 2024-02-19 ENCOUNTER — Ambulatory Visit: Admitting: Physician Assistant

## 2024-02-19 DIAGNOSIS — Z170 Estrogen receptor positive status [ER+]: Secondary | ICD-10-CM

## 2024-02-19 LAB — CBC
HCT: 41 % (ref 36.0–46.0)
Hemoglobin: 13.3 g/dL (ref 12.0–15.0)
MCH: 30 pg (ref 26.0–34.0)
MCHC: 32.4 g/dL (ref 30.0–36.0)
MCV: 92.3 fL (ref 80.0–100.0)
Platelets: 314 K/uL (ref 150–400)
RBC: 4.44 MIL/uL (ref 3.87–5.11)
RDW: 14.2 % (ref 11.5–15.5)
WBC: 10.1 K/uL (ref 4.0–10.5)
nRBC: 0 % (ref 0.0–0.2)

## 2024-02-19 LAB — BASIC METABOLIC PANEL WITH GFR
Anion gap: 16 — ABNORMAL HIGH (ref 5–15)
BUN: 31 mg/dL — ABNORMAL HIGH (ref 8–23)
CO2: 17 mmol/L — ABNORMAL LOW (ref 22–32)
Calcium: 8.7 mg/dL — ABNORMAL LOW (ref 8.9–10.3)
Chloride: 102 mmol/L (ref 98–111)
Creatinine, Ser: 1.86 mg/dL — ABNORMAL HIGH (ref 0.44–1.00)
GFR, Estimated: 26 mL/min — ABNORMAL LOW (ref >60)
Glucose, Bld: 85 mg/dL (ref 70–99)
Potassium: 4.4 mmol/L (ref 3.5–5.1)
Sodium: 135 mmol/L (ref 135–145)

## 2024-02-19 LAB — GLUCOSE, CAPILLARY
Glucose-Capillary: 85 mg/dL (ref 70–99)
Glucose-Capillary: 183 mg/dL — ABNORMAL HIGH (ref 70–99)
Glucose-Capillary: 193 mg/dL — ABNORMAL HIGH (ref 70–99)
Glucose-Capillary: 183 mg/dL — ABNORMAL HIGH (ref 70–99)

## 2024-02-19 MED ORDER — INSULIN GLARGINE 100 UNIT/ML ~~LOC~~ SOLN
5.0000 [IU] | Freq: Two times a day (BID) | SUBCUTANEOUS | Status: DC
  Administered 2024-02-19 – 2024-02-22 (×6): 5 [IU] via SUBCUTANEOUS
  Filled 2024-02-19 (×7): qty 0.05

## 2024-02-19 MED ORDER — LACTATED RINGERS IV BOLUS
1000.0000 mL | Freq: Once | INTRAVENOUS | Status: AC
  Administered 2024-02-19: 1000 mL via INTRAVENOUS

## 2024-02-19 NOTE — TOC Transition Note (Signed)
 Transition of Care [facility]) - Discharge Note   Patient Details  Name: Summer Hawkins MRN: [id number] Date of Birth: 04-22-1934  [facility]) CM/SW Contact:  Rosalva Jon Bloch, RN Phone Number: 02/19/2024, 11:16 AM   Clinical Narrative:    Patient will DC to: home Anticipated DC date: 02/19/2024 Family notified: yes Transport by: car     - Presents s/p fall with pain back shoulders and hip  Per MD patient ready for DC today pending therapy clearance. RN, and patient  notified of DC. Pt from home alone.  Pt states has good family/friend.  Pt agreeable to home health services. Provider preference: [facility]. Referral made with [facility] and accepted. Pt without DME needs noted. States has RW and rollator @ home.  Pt without transportation issues or RX med concerns.    RNCM will sign off for now as intervention is no longer needed. Please consult us  again if new needs arise.   Final next level of care: Home w Home Health Services Barriers to Discharge: No Barriers Identified   Patient Goals and CMS Choice     Choice offered to / list presented to : Patient      Discharge Placement                       Discharge Plan and Services Additional resources added to the After Visit Summary for                            [facility] Arranged: PT, OT, RN [facility] Agency: [facility] Date [facility] Agency Contacted: 02/19/24 Time HH Agency Contacted: 1116 Representative spoke with at [facility] Agency: Arna  Social Drivers of Health (SDOH) Interventions SDOH Screenings   Food Insecurity: No Food Insecurity (12/12/2023)  Housing: Low Risk  (12/12/2023)  Transportation Needs: No Transportation Needs (12/12/2023)  Utilities: Not At Risk (12/12/2023)  Depression (PHQ2-9): Low Risk  (01/28/2024)  Social Connections: Moderately Isolated (08/16/2023)  Tobacco Use: Low Risk  (02/18/2024)     Readmission Risk Interventions     No data to display

## 2024-02-19 NOTE — Hospital Course (Signed)
 89 y.o. female with medical history significant for hypertension, type 2 diabetes mellitus, atrial fibrillation on Eliquis , CKD stage IV, symptomatic bradycardia with pacemaker, chronic, left leg weakness, and breast cancer who presents with headache and pain in her back, shoulders, and hips after a fall.    Assessment and Plan:   Possible orthostatic hypotension and syncope - Patient relating that she had passed out yesterday.  Had an episode of orthostatic syncope this morning getting up from the bed with OT.  IV fluid bolus initiated.  Will discontinue amlodipine .  Monitor closely.   Multiple falls with intractable pain - No acute findings on trauma imaging.  Pain control and cyclobenzaprine  added.  Showing marked improvement this morning.  Evaluated by PT/OT.  Initially recommending home health however repeat evaluation this morning showing needing max assist with some tasks.  Patient lives alone at home.  Likely pursue SNF.   Atrial fibrillation - Eliquis  and metoprolol  on board.   Diabetes mellitus - Insulin  sliding scale.   Hypertension - Amlodipine  discontinued.  Continue ARB.   CKD 4 - Creatinine appears around baseline.  Will monitor urine output recheck BMP in AM.   Breast cancer - Followed by Dr. Lanny.  Currently on anastrozole .  Scheduled to start radiation this week per patient.

## 2024-02-19 NOTE — Progress Notes (Signed)
 Physical Therapy Treatment Patient Details Name: Summer Hawkins MRN: [id number] DOB: October 02, 1934 Today's Date: 02/19/2024   History of Present Illness 89 yo F adm 12/7 s/p fall with pain back shoulders and hip Pt with workup for mild TBI. Pt currently with breast CA treatment.  PMH: breast cancer, CKD III, DMII, HTN, pacemaker, RC repair. Atrial fib on eliquis , back surgery with residual LLE weakness    PT Comments  Pt received in supine after working with OT, pt agreeable to therapy session, noted memory deficits with pt unable to recall working with PT previous date on her evaluation in ED. With simulated home bed set-up, pt unable to achieve EOB unassisted, with episode of mild distress while pt attempting to roll and sit to R EOB initially, needing to roll back to supine, RN/MD notified of episode, pt states I'm going out but remains responsive, had been appearing to hold her breath and c/o decreased vision when rolling toward her R side. Pt given increased assist, +2 modA and increased time to transfer to R EOB after this and when turning head to R/L at EOB, pt denies darkening of vision, but does c/o some neck pain, but moreso mid-thoracic and underneath shoulder blade cramping type pain bilaterally, possibly worse on her R side. Pt needing up to minA (+2 safety) with mod safety/sequencing cues for transfers from bed height to rollator and to sit in chair. Pt performed gait trial with rest break halfway through (chair follow for safety) and CGA, HR to 90's bpm with exertion, had been ~70 bpm resting. Given pt lives alone and currently unable to get OOB or transfer without lift assist from staff this date, recommend short term post-acute rehab, pt agreeable when discussed during session; update discussed with supervising PT Ashly C as well. Patient will benefit from continued inpatient follow up therapy, <3 hours/day.  BP- Lying 140/70 ((91) prior to attempting to sit EOB)  Pulse- Lying 69  Oxygen   Therapy  SpO2 100 %   Pulse Rate 70  BP (!) 151/91 (110)  BP Location Left Arm  BP Method Automatic  Patient Position (if appropriate) Lying (after episode detailed above, prior to sitting EOB)  SBP taken sitting EOB reading 152, similar to above reading, no other symptoms   If plan is discharge home, recommend the following: A little help with bathing/dressing/bathroom;Assistance with cooking/housework;Assist for transportation;Help with stairs or ramp for entrance;A lot of help with walking and/or transfers;Supervision due to cognitive status   Can travel by private vehicle      (may have difficulty with SUV type vehicle)  Equipment Recommendations  None recommended by PT (TBD)    Recommendations for Other Services       Precautions / Restrictions Precautions Precautions: Fall Recall of Precautions/Restrictions: Intact Precaution/Restrictions Comments: recent hx of vasovagal or syncopal type episodes, correlating with back pain/spasms while moving Restrictions Weight Bearing Restrictions Per Provider Order: No     Mobility  Bed Mobility Overal bed mobility: Needs Assistance Bed Mobility: Rolling, Sidelying to Sit Rolling: Min assist, Used rails Sidelying to sit: Mod assist, +2 for physical assistance, Used rails       General bed mobility comments: Pt attempted to roll to R EOB (per home set-up with rail on R side) but had episode of my vision's going dark, I'm going out and grimaced, rolled back to supine (RN present and witnessed episode). At time of episode, pt was being cued for rolling unassisted and appeared anxious, unsure if it was panic  related or related to lying on her R side with increased effort and body position causing vagal type symptoms, as pt BP appeared slightly elevated from exertion but all other VS WFL. Pt rested for a couple mins then agreeable to reattempt transfer to R EOB, needing increased assist, up to modA +2 for BLE and trunk lifting assist  and assist to scoot forward to foot flat. Pt denies dizziness sitting EOB but does report frequent cramps/mid and upper back pain under shoulder blades sitting EOB, RN/MD notified.    Transfers Overall transfer level: Needs assistance Equipment used: Rollator (4 wheels) Transfers: Sit to/from Stand Sit to Stand: Min assist, +2 safety/equipment           General transfer comment: from EOB to rollator, mod cues for safe technique, increased time to initiate and perform, pt aware of brakes and needed reminder to push from EOB. x2 attempts as pt unable to stand initially when attempting without lift assist. Pt also sat to recliner with cues for safety/UE placement needed.    Ambulation/Gait Ambulation/Gait assistance: Contact guard assist Gait Distance (Feet): 80 Feet (x2) Assistive device: Rollator (4 wheels) Gait Pattern/deviations: Step-to pattern, Decreased step length - left, Drifts right/left       General Gait Details: Assist for safety. Pt with LLE stiffness and decreased L hip and knee flexion, pt reports this is from prior injury. Pt not shuffling and has some bil foot clearance this date. Some drift to L/R side of hallway but not severe. Fair financial planner. Needs cues for activity pacing.   Stairs             Wheelchair Mobility     Tilt Bed    Modified Rankin (Stroke Patients Only)       Balance Overall balance assessment: Needs assistance Sitting-balance support: Single extremity supported, Feet supported Sitting balance-Leahy Scale: Fair Sitting balance - Comments: EOB   Standing balance support: Bilateral upper extremity supported, During functional activity, Single extremity supported Standing balance-Leahy Scale: Poor Standing balance comment: reliant on BUE support today, less stable when c/o mid and upper back spasms                            Communication Communication Communication: Impaired Factors Affecting  Communication: Hearing impaired  Cognition Arousal: Alert Behavior During Therapy: Anxious, Lability   PT - Cognitive impairments: Memory, Sequencing, Initiation, Problem solving, Safety/Judgement                       PT - Cognition Comments: Pt not able to recall working with PT in ED prior to getting up to Mgm mirage. Per MD, she was also unable to recall when he discussed PT working with her before. Pt becomes more anxious while trying to get OOB unassisted with similar environmental set-up to her home, and states I'm going out/vision going dark while attempting to roll to her R side unassisted, then lays back to supine with decreased responsiveness for a few seconds, then pt states I had a cramp in my R shoulder/back. Pt often having back spasms/cramps in mid and upper back while seated during session, holding her breath, needing to wait prior to attending to task. RN/MD notified of episodes. Pt encouraged to not hold her breath, which could exacerbate her symptoms. Following commands: Impaired Following commands impaired: Follows one step commands with increased time    Cueing Cueing Techniques: Verbal cues, Gestural cues,  Tactile cues  Exercises Other Exercises Other Exercises: seated BLE AROM: LAQ x10 reps ea    General Comments General comments (skin integrity, edema, etc.): Since pt had syncopal type episode earlier in the day with nursing staff, BP checked with multiple postures, slightly elevated but WFL, HR/SpO2 WFL, entered into flowsheet. No dizziness or nausea sitting or standing.      Pertinent Vitals/Pain Pain Assessment Pain Assessment: PAINAD Breathing: occasional labored breathing, short period of hyperventilation Negative Vocalization: occasional moan/groan, low speech, negative/disapproving quality Facial Expression: facial grimacing Body Language: tense, distressed pacing, fidgeting Consolability: distracted or reassured by voice/touch PAINAD  Score: 6 Pain Location: upper and mid-back, just under my shoulder blades, more c/o R side than L side, also LLE pain with AA Pain Descriptors / Indicators: Sore, Spasm, Grimacing, Guarding, Cramping, Moaning Pain Intervention(s): Limited activity within patient's tolerance, Monitored during session, Repositioned, Patient requesting pain meds-RN notified    Home Living Family/patient expects to be discharged to:: Private residence Living Arrangements: Alone                      Prior Function            PT Goals (current goals can now be found in the care plan section) Acute Rehab PT Goals Patient Stated Goal: Return home when stronger, be able to get around my house on my own PT Goal Formulation: With patient Time For Goal Achievement: 02/25/24 Progress towards PT goals: Progressing toward goals    Frequency    Min 3X/week      PT Plan      Co-evaluation              AM-PAC PT 6 Clicks Mobility   Outcome Measure  Help needed turning from your back to your side while in a flat bed without using bedrails?: A Lot Help needed moving from lying on your back to sitting on the side of a flat bed without using bedrails?: Total (+2 today) Help needed moving to and from a bed to a chair (including a wheelchair)?: A Little Help needed standing up from a chair using your arms (e.g., wheelchair or bedside chair)?: A Little Help needed to walk in hospital room?: A Little Help needed climbing 3-5 steps with a railing? : Total 6 Click Score: 13    End of Session Equipment Utilized During Treatment: Gait belt Activity Tolerance: Patient tolerated treatment well;Patient limited by pain;Other (comment) (c/o mid-back pain and spasms frequently) Patient left: in chair;with call bell/phone within reach;with chair alarm set;Other (comment) (recliner) Nurse Communication: Mobility status;Precautions;Other (comment) (BP stable during PT, pt can use RW to pivot or amb in  room) PT Visit Diagnosis: Unsteadiness on feet (R26.81);Other abnormalities of gait and mobility (R26.89);Muscle weakness (generalized) (M62.81);History of falling (Z91.81);Pain Pain - Right/Left:  (middle and bilateral) Pain - part of body:  (back and under her shoulderblades)     Time: 8787-8742 PT Time Calculation (min) (ACUTE ONLY): 45 min  Charges:    $Gait Training: 8-22 mins $Therapeutic Activity: 23-37 mins PT General Charges $$ ACUTE PT VISIT: 1 Visit                     Jessice Madill P., PTA Acute Rehabilitation Services Secure Chat Preferred 9a-5:30pm Office: [phone]    [facility] 02/19/2024, 2:24 PM

## 2024-02-19 NOTE — TOC Progression Note (Addendum)
 Transition of Care [facility]) - Progression Note    Patient Details  Name: Summer Hawkins MRN: [id number] Date of Birth: Jul 15, 1934  Transition [facility] CM/SW Contact  Bridget Cordella Simmonds, LCSW Phone Number: 02/19/2024, 3:12 PM  Clinical Narrative:   Pt with existing passr: [phone] [id number]  Bed offers provided to pt.  She will review.    Expected Discharge Plan: Skilled Nursing Facility Barriers to Discharge: Other (must enter comment) (SNF workup)               Expected Discharge Plan and Services                                   HH Arranged: PT, OT, RN [facility] Agency: [facility] Date [facility] Agency Contacted: 02/19/24 Time HH Agency Contacted: [phone] Representative spoke with at [facility] Agency: Arna   Social Drivers of Health (SDOH) Interventions SDOH Screenings   Food Insecurity: Unknown (02/19/2024)  Housing: Unknown (02/19/2024)  Transportation Needs: No Transportation Needs (02/19/2024)  Utilities: Not At Risk (02/19/2024)  Depression (PHQ2-9): Low Risk  (01/28/2024)  Social Connections: Moderately Isolated (02/19/2024)  Tobacco Use: Low Risk  (02/18/2024)    Readmission Risk Interventions     No data to display

## 2024-02-19 NOTE — Progress Notes (Signed)
 Progress Note   Patient: Summer Hawkins FMW:[id number] DOB: October 17, 1934 DOA: 02/17/2024  DOS: the patient was seen and examined on 02/19/2024   Brief hospital course:  89 y.o. female with medical history significant for hypertension, type 2 diabetes mellitus, atrial fibrillation on Eliquis , CKD stage IV, symptomatic bradycardia with pacemaker, chronic, left leg weakness, and breast cancer who presents with headache and pain in her back, shoulders, and hips after a fall.   Assessment and Plan:  Possible orthostatic hypotension and syncope - Patient relating that she had passed out yesterday.  Had an episode of orthostatic syncope this morning getting up from the bed with OT.  IV fluid bolus initiated.  Will discontinue amlodipine .  Monitor closely.  Multiple falls with intractable pain - No acute findings on trauma imaging.  Pain control and cyclobenzaprine  added.  Showing marked improvement this morning.  Evaluated by PT/OT.  Initially recommending home health however repeat evaluation this morning showing needing max assist with some tasks.  Patient lives alone at home.  Likely pursue SNF.  Atrial fibrillation - Eliquis  and metoprolol  on board.  Diabetes mellitus - Insulin  sliding scale.  Hypertension - Amlodipine  discontinued.  Continue ARB.  CKD 4 - Creatinine appears around baseline.  Will monitor urine output recheck BMP in AM.  Breast cancer - Followed by Dr. Lanny.  Currently on anastrozole .  Scheduled to start radiation this week per patient.   Subjective: Patient resting comfortably this morning.  States her pain is much improved.  Had some memory difficulties, she worked with PT yesterday.  Denies any fever, chills, chest pain, shortness of breath nausea, vomiting, abdominal pain.  Somewhat eager to be discharged if possible.  Physical Exam:  Vitals:   02/18/24 1522 02/18/24 2123 02/19/24 0442 02/19/24 0809  BP: 134/65 (!) 146/77 136/80 118/65  Pulse: 72 72 71 69   Resp: 17 18 18    Temp: 98 F (36.7 C) 97.7 F (36.5 C) 98.1 F (36.7 C) 97.8 F (36.6 C)  TempSrc: Oral Oral Oral   SpO2: 100% 99% 97% 98%    GENERAL:  Alert, pleasant, no acute distress, frail HEENT:  EOMI CARDIOVASCULAR: Irregularly irregular RESPIRATORY:  Clear to auscultation, no wheezing, rales, or rhonchi GASTROINTESTINAL:  Soft, nontender, nondistended EXTREMITIES:  No LE edema bilaterally NEURO:  No new focal deficits appreciated SKIN:  No rashes noted PSYCH:  Appropriate mood and affect     Data Reviewed:  Imaging Studies: CT T-SPINE NO CHARGE Result Date: 02/17/2024 EXAM: CT THORACIC SPINE WITHOUT CONTRAST 02/17/2024 09:16:19 PM TECHNIQUE: CT of the thoracic spine was performed without the administration of intravenous contrast. Multiplanar reformatted images are provided for review. Automated exposure control, iterative reconstruction, and/or weight based adjustment of the mA/kV was utilized to reduce the radiation dose to as low as reasonably achievable. COMPARISON: None available. CLINICAL HISTORY: FINDINGS: BONES AND ALIGNMENT: Osseous structures are diffusely osteopenic. Mild thoracic dextrocurvature. Normal thoracic kyphosis. No listhesis. Normal vertebral body heights. No acute fracture of the thoracic spine. No suspicious bone lesion. DEGENERATIVE CHANGES: Changes of advanced degenerative disc disease with bridging disc osteophytes involving the vertebral bodies of T4-T11. Endplate remodeling and vacuum disc phenomenon at T11-12 in keeping with change of advanced degenerative disc disease. No high-grade canal stenosis. No high-grade neuroforaminal narrowing. SOFT TISSUES: No paraspinal fluid collection or inflammatory change identified. No acute abnormality. IMPRESSION: 1. No acute abnormality of the thoracic spine. 2. Advanced degenerative disc disease with bridging disc osteophytes involving T4-T11, with endplate remodeling and vacuum disc phenomenon  at T11-12, without  high-grade canal stenosis or neuroforaminal narrowing. 3. Diffuse osteopenia. Electronically signed by: Dorethia Molt MD 02/17/2024 09:38 PM EST RP Workstation: [id number]   CT L-SPINE NO CHARGE Result Date: 02/17/2024 EXAM: CT OF THE LUMBAR SPINE WITHOUT CONTRAST 02/17/2024 09:16:19 PM TECHNIQUE: CT of the lumbar spine was performed without the administration of intravenous contrast. Multiplanar reformatted images are provided for review. Automated exposure control, iterative reconstruction, and/or weight based adjustment of the mA/kV was utilized to reduce the radiation dose to as low as reasonably achievable. COMPARISON: None available. CLINICAL HISTORY: FINDINGS: BONES AND ALIGNMENT: L3-L5 anterior and posterior lumbar fusion with instrumentation. Prosthetic lucencies surrounding the right L3 pedicle screw suggest motion at this level. No definite bridging callus identified involving the vertebral body right L3-4 facet joint. Left L3 hemilaminectomy. Bilateral L4 laminotomy. Mild straightening along the fused segments. Normal vertebral body heights. No acute fracture. No listhesis. No suspicious bone lesion. DEGENERATIVE CHANGES: Ankylosis of the facet joints bilaterally at L4-5. No high-grade canal stenosis. No high-grade neural foraminal narrowing. SOFT TISSUES: No acute abnormality. IMPRESSION: 1. No acute findings. 2. L3-L5 anterior and posterior lumbar fusion with instrumentation, with periprosthetic lucency surrounding the right L3 pedicle screw suggesting motion at this level, and no definite bridging callus identified involving the vertebral body right L3-4 facet joint. 3. Left L3 hemilaminectomy and bilateral L4 laminotomy. 4. Ankylosis of the facet joints bilaterally at L4-5. Electronically signed by: Dorethia Molt MD 02/17/2024 09:35 PM EST RP Workstation: [id number]   CT CHEST ABDOMEN PELVIS WO CONTRAST Result Date: 02/17/2024 EXAM: CT CHEST, ABDOMEN AND PELVIS WITHOUT CONTRAST 02/17/2024  09:16:19 PM TECHNIQUE: CT of the chest, abdomen and pelvis was performed without the administration of intravenous contrast. Multiplanar reformatted images are provided for review. Automated exposure control, iterative reconstruction, and/or weight based adjustment of the mA/kV was utilized to reduce the radiation dose to as low as reasonably achievable. COMPARISON: Prior examination of 12/09/2014. CLINICAL HISTORY: Fall backwards, head injury, neck and back pain. FINDINGS: CHEST: MEDIASTINUM AND LYMPH NODES: Looks like a subclavian single lead pacemaker is in place, but its lead is within the right ventricle toward the basilar septum. Global cardiac size is at the upper limits of normal. Extensive multivessel coronary artery calcification. No pericardial effusion. The pulmonary arteries are of normal caliber. Moderate atherosclerotic calcification within the thoracic aorta. No aortic aneurysm. The central airways are clear. No pathologic thoracic adenopathy. The esophagus is unremarkable. Visualized thyroid  is unremarkable. LUNGS AND PLEURA: No focal consolidation or pulmonary edema. No pleural effusion or pneumothorax. ABDOMEN AND PELVIS: LIVER: The liver is unremarkable. GALLBLADDER AND BILE DUCTS: Status post cholecystectomy. No biliary ductal dilatation. SPLEEN: No acute abnormality. PANCREAS: No acute abnormality. ADRENAL GLANDS: No acute abnormality. KIDNEYS, URETERS AND BLADDER: 10 mm exophytic lesion arising from the anterior interpolar region of the right kidney demonstrates a mean density of 71 Hounsfield units, compatible with a hyperdense cortical cyst. The kidneys are otherwise unremarkable. No stones in the kidneys or ureters. No hydronephrosis. No perinephric or periureteral stranding. Urinary bladder is unremarkable. GI AND BOWEL: Few scattered diverticula are seen within the sigmoid colon without superimposed acute inflammatory change. The appendix is normal. The stomach, small bowel, and large  bowel are otherwise unremarkable. There is no bowel obstruction. REPRODUCTIVE ORGANS: Status post hysterectomy. No adnexal mass. PERITONEUM AND RETROPERITONEUM: No ascites. No free air. Tiny fat-containing umbilical hernia. VASCULATURE: Aorta is normal in caliber. Iliac atherosclerotic calcification. No aortic aneurysm. ABDOMINAL AND PELVIS LYMPH NODES: No lymphadenopathy. BONES  AND SOFT TISSUES: Soft tissue infiltration within the right breast with associated surgical clips noted, consistent with surgical changes of partial right breast resection noted on 01/14/2024. Advanced degenerative changes are seen within the thoracic spine. L3-L5 lumbar fusion with instrumentation has been performed. Advanced asymmetric degenerative changes are seen within the left hip. Osseous structures are diffusely osteopenic. No acute bone abnormality within the thorax, abdomen, and pelvis. No focal soft tissue abnormality in the abdomen and pelvis. IMPRESSION: 1. No acute abnormality of the chest, abdomen, and pelvis related to the fall with head, neck, and back pain. 2. Single-lead right ventricular pacemaker in place. Global cardiac size at the upper limits of normal. Extensive multivessel coronary artery calcification 3. Postsurgical changes of partial right breast resection with associated soft tissue infiltration and surgical clips. Electronically signed by: Dorethia Molt MD 02/17/2024 09:28 PM EST RP Workstation: [id number]   CT Head Wo Contrast Result Date: 02/17/2024 EXAM: CT HEAD AND CERVICAL SPINE 02/17/2024 07:58:18 PM TECHNIQUE: CT of the head and cervical spine was performed without the administration of intravenous contrast. Multiplanar reformatted images are provided for review. Automated exposure control, iterative reconstruction, and/or weight based adjustment of the mA/kV was utilized to reduce the radiation dose to as low as reasonably achievable. COMPARISON: None available. CLINICAL HISTORY: trauma trauma  FINDINGS: CT HEAD BRAIN AND VENTRICLES: No acute intracranial hemorrhage. No mass effect or midline shift. No abnormal extra-axial fluid collection. No evidence of acute infarct. No hydrocephalus. Cerebral atrophy. ORBITS: No acute abnormality. SINUSES AND MASTOIDS: No acute abnormality. SOFT TISSUES AND SKULL: No acute skull fracture. No acute soft tissue abnormality. CT CERVICAL SPINE BONES AND ALIGNMENT: No acute fracture or traumatic malalignment. DEGENERATIVE CHANGES: Multilevel degenerative changes greatest at C5-C6 where there is severe degenerative disc disease with disc height loss and endplate spurring. Also, facet/uncovertebral hypertrophy at this level with potentially severe foraminal stenosis. SOFT TISSUES: No prevertebral soft tissue swelling. IMPRESSION: 1. No acute intracranial abnormality. 2. No acute fracture or traumatic malalignment of the cervical spine. 3. Severe C5-C6 degenerative change. Electronically signed by: Gilmore Molt MD 02/17/2024 08:09 PM EST RP Workstation: [id number]   CT Cervical Spine Wo Contrast Result Date: 02/17/2024 EXAM: CT HEAD AND CERVICAL SPINE 02/17/2024 07:58:18 PM TECHNIQUE: CT of the head and cervical spine was performed without the administration of intravenous contrast. Multiplanar reformatted images are provided for review. Automated exposure control, iterative reconstruction, and/or weight based adjustment of the mA/kV was utilized to reduce the radiation dose to as low as reasonably achievable. COMPARISON: None available. CLINICAL HISTORY: trauma trauma FINDINGS: CT HEAD BRAIN AND VENTRICLES: No acute intracranial hemorrhage. No mass effect or midline shift. No abnormal extra-axial fluid collection. No evidence of acute infarct. No hydrocephalus. Cerebral atrophy. ORBITS: No acute abnormality. SINUSES AND MASTOIDS: No acute abnormality. SOFT TISSUES AND SKULL: No acute skull fracture. No acute soft tissue abnormality. CT CERVICAL SPINE BONES AND  ALIGNMENT: No acute fracture or traumatic malalignment. DEGENERATIVE CHANGES: Multilevel degenerative changes greatest at C5-C6 where there is severe degenerative disc disease with disc height loss and endplate spurring. Also, facet/uncovertebral hypertrophy at this level with potentially severe foraminal stenosis. SOFT TISSUES: No prevertebral soft tissue swelling. IMPRESSION: 1. No acute intracranial abnormality. 2. No acute fracture or traumatic malalignment of the cervical spine. 3. Severe C5-C6 degenerative change. Electronically signed by: Gilmore Molt MD 02/17/2024 08:09 PM EST RP Workstation: [id number]   DG Chest Port 1 View Result Date: 02/17/2024 EXAM: 1 VIEW(S) XRAY OF THE CHEST 02/17/2024 06:39:00 PM COMPARISON:  12/23/2020 CLINICAL HISTORY: Trauma Trauma FINDINGS: LUNGS AND PLEURA: No focal pulmonary opacity. No pleural effusion. No pneumothorax. HEART AND MEDIASTINUM: Stable cardiomegaly. Left-sided pacemaker is unchanged. BONES AND SOFT TISSUES: No acute osseous abnormality. IMPRESSION: 1. No acute cardiopulmonary process. Stable cardiomegaly and unchanged left-sided pacemaker. Electronically signed by: Lynwood Seip MD 02/17/2024 06:44 PM EST RP Workstation: [id number]   DG Pelvis Portable Result Date: 02/17/2024 EXAM: 1 or 2 VIEW(S) XRAY OF THE PELVIS 02/17/2024 06:39:00 PM COMPARISON: None available. CLINICAL HISTORY: Trauma FINDINGS: BONES AND JOINTS: No acute fracture. No malalignment. Severe degenerative change is seen involving the left hip joint. SOFT TISSUES: The soft tissues are unremarkable. IMPRESSION: 1. No evidence of acute traumatic injury. 2. Severe left hip osteoarthritis. Electronically signed by: Lynwood Seip MD 02/17/2024 06:43 PM EST RP Workstation: [id number]    There are no new results to review at this time.  Previous records (including but not limited to H&P, progress notes, nursing notes, TOC management) were reviewed in assessment of this  patient.  Labs: CBC: Recent Labs  Lab 02/17/24 1817 02/17/24 1820 02/18/24 0531 02/19/24 0549  WBC 10.6*  --  11.1* 10.1  HGB 13.4 15.3* 12.7 13.3  HCT 42.4 45.0 39.5 41.0  MCV 93.6  --  92.1 92.3  PLT 349  --  315 314   Basic Metabolic Panel: Recent Labs  Lab 02/17/24 1817 02/17/24 1820 02/18/24 0531 02/19/24 0549  NA 134* 135 138 135  K 4.4 4.4 4.3 4.4  CL 99 101 106 102  CO2 22  --  21* 17*  GLUCOSE 310* 314* 154* 85  BUN 39* 41* 33* 31*  CREATININE 2.09* 2.20* 1.72* 1.86*  CALCIUM  9.0  --  8.8* 8.7*   Liver Function Tests: Recent Labs  Lab 02/17/24 1817  AST 19  ALT 14  ALKPHOS 115  BILITOT 0.8  PROT 7.9  ALBUMIN 3.8   CBG: Recent Labs  Lab 02/18/24 1220 02/18/24 1619 02/18/24 2126 02/19/24 0629 02/19/24 1118  GLUCAP 197* 172* 177* 85 183*    Scheduled Meds:  anastrozole   1 mg Oral Daily   apixaban   2.5 mg Oral BID   atorvastatin   20 mg Oral Daily   insulin  aspart  0-5 Units Subcutaneous QHS   insulin  aspart  0-6 Units Subcutaneous TID WC   insulin  glargine  10 Units Subcutaneous BID   irbesartan   300 mg Oral Daily   levothyroxine   125 mcg Oral QAC breakfast   metoprolol  succinate  25 mg Oral Daily   sodium chloride  flush  3 mL Intravenous Q12H   Continuous Infusions: PRN Meds:.acetaminophen  **OR** acetaminophen , cyclobenzaprine , oxyCODONE , senna  Family Communication: None at bedside  Disposition: Status is: Inpatient Remains inpatient appropriate because: Falls, syncope     Time spent: 36 minutes  Length of inpatient stay: 1 days  Author: Carliss LELON Canales, DO 02/19/2024 12:20 PM  For on call review [email or web address].

## 2024-02-19 NOTE — Plan of Care (Signed)
  Problem: Metabolic: Goal: Ability to maintain appropriate glucose levels will improve Outcome: Progressing   Problem: Skin Integrity: Goal: Risk for impaired skin integrity will decrease Outcome: Progressing   Problem: Pain Managment: Goal: General experience of comfort will improve and/or be controlled Outcome: Progressing

## 2024-02-19 NOTE — TOC Progression Note (Signed)
 Transition of Care [facility]) - Progression Note    Patient Details  Name: Summer Hawkins MRN: [id number] Date of Birth: December 10, 1934  [facility] CM/SW Contact  Rosalva Jon Bloch, RN Phone Number: 02/19/2024, 12:59 PM  Clinical Narrative:    RNCM received consult for possible SNF placement at time of discharge. RNCM spoke with patient regarding PT recommendation of SNF placement at time of discharge. Patient reports she is currently unable to care for self independently at home given current physical needs and fall risk. Patient expressed understanding of PT recommendation and is agreeable to SNF placement at time of discharge. Patient without preference. RNCM discussed insurance authorization process and provided Medicare SNF ratings list. Patient expressed being hopeful for rehab and to feel better soon. No further questions reported at this time.  IP CM team will continue following and assist with discharge planning needs.    Expected Discharge Plan: Skilled Nursing Facility Barriers to Discharge: Other (must enter comment) (SNF workup)               Expected Discharge Plan and Services                                   HH Arranged: PT, OT, RN [facility] Agency: [facility] Date [facility] Agency Contacted: 02/19/24 Time HH Agency Contacted: 1116 Representative spoke with at [facility] Agency: Arna   Social Drivers of Health (SDOH) Interventions SDOH Screenings   Food Insecurity: No Food Insecurity (12/12/2023)  Housing: Low Risk  (12/12/2023)  Transportation Needs: No Transportation Needs (12/12/2023)  Utilities: Not At Risk (12/12/2023)  Depression (PHQ2-9): Low Risk  (01/28/2024)  Social Connections: Moderately Isolated (08/16/2023)  Tobacco Use: Low Risk  (02/18/2024)    Readmission Risk Interventions     No data to display

## 2024-02-19 NOTE — NC FL2 (Addendum)
 [place]  MEDICAID FL2 LEVEL OF CARE FORM     IDENTIFICATION  Patient Name: Summer Hawkins Birthdate: February 24, 1935 Sex: female Admission Date (Current Location): 02/17/2024  [facility] and [place] Number:  Producer, Television/film/video and Address:  [facility] [place], [address] [place], [place] [address]      Provider Number: [id number]  Attending Physician Name and Address:  Arlon Carliss ORN, DO  Relative Name and Phone Number:       Current Level of Care: Hospital Recommended Level of Care: Skilled Nursing Facility Prior Approval Number:    Date Approved/Denied:   PASRR Number: [id number]  Discharge Plan: SNF    Current Diagnoses: Patient Active Problem List   Diagnosis Date Noted   Mild TBI (traumatic brain injury) (HCC) 02/18/2024   Intractable pain 02/18/2024   Malignant neoplasm of upper-inner quadrant of right breast in female, estrogen receptor positive (HCC) 11/30/2023   Acute CVA (cerebrovascular accident) (HCC) 08/15/2023   Vaccine counseling 12/31/2022   Hardware complicating wound infection 08/28/2022   Proteus infection 08/28/2022   Pruritus 07/10/2022   Open wound of lumbar region 01/19/2022   Arthritis of hip 11/01/2021   History of lumbar fusion 11/01/2021   Hip pain 11/01/2021   Postoperative complication of skin involving drainage from surgical wound 12/20/2020   Sepsis (HCC) 11/26/2020   Back pain with history of spinal surgery 11/26/2020   Gram-negative bacteremia 11/26/2020   Sepsis due to urinary tract infection (HCC) 11/25/2020   Acute metabolic encephalopathy 11/25/2020   Type 2 diabetes mellitus without complication, with long-term current use of insulin  (HCC) 11/25/2020   Hypothyroidism 11/25/2020   CKD (chronic kidney disease), stage IV (HCC) 11/25/2020   Spondylolisthesis, lumbar region 11/17/2020   Posterior capsular opacification, left 03/31/2020   Posterior vitreous detachment of both eyes 03/31/2020   Intermediate  stage nonexudative age-related macular degeneration of both eyes 09/25/2019   Moderate nonproliferative diabetic retinopathy of both eyes (HCC) 09/25/2019   Pacemaker 03/20/2019   Symptomatic bradycardia 11/30/2018   Near syncope 11/21/2018   Carpal tunnel syndrome of right wrist 10/24/2018   Bradycardia 09/25/2017   Permanent atrial fibrillation (HCC)    Chronic diastolic CHF (congestive heart failure) (HCC) 08/08/2017   Essential hypertension 08/08/2017   Breast cancer of upper-outer quadrant of left female breast (HCC) 01/05/2016   Chronic atrial fibrillation (HCC) 01/05/2011   Morbid obesity (HCC) 12/29/2010    Orientation RESPIRATION BLADDER Height & Weight     Self, Time, Situation, Place  Normal External catheter Weight:   Height:     BEHAVIORAL SYMPTOMS/MOOD NEUROLOGICAL BOWEL NUTRITION STATUS      Continent Diet (refer to d/c summary)  AMBULATORY STATUS COMMUNICATION OF NEEDS Skin   Extensive Assist Verbally Normal                       Personal Care Assistance Level of Assistance  Bathing, Feeding, Dressing Bathing Assistance: Maximum assistance Feeding assistance: Independent Dressing Assistance: Maximum assistance     Functional Limitations Info  Sight, Hearing, Speech Sight Info: Impaired (wears prescription glasses) Hearing Info: Adequate Speech Info: Adequate    SPECIAL CARE FACTORS FREQUENCY  PT (By licensed PT), OT (By licensed OT)     PT Frequency: 5x/week, evaluate and treat OT Frequency: 5x/week, evaluate and treat            Contractures Contractures Info: Not present    Additional Factors Info  Allergies   Allergies Info: Bee Venom,  Penicillins, Lisinopril, Zocor (Simvastatin), Liraglutide           Current Medications (02/19/2024):  This is the current hospital active medication list Current Facility-Administered Medications  Medication Dose Route Frequency Provider Last Rate Last Admin   acetaminophen  (TYLENOL ) tablet 650 mg   650 mg Oral Q6H PRN Opyd, Timothy S, MD   650 mg at 02/18/24 9262   Or   acetaminophen  (TYLENOL ) suppository 650 mg  650 mg Rectal Q6H PRN Opyd, Timothy S, MD       anastrozole  (ARIMIDEX ) tablet 1 mg  1 mg Oral Daily Opyd, Timothy S, MD       apixaban  (ELIQUIS ) tablet 2.5 mg  2.5 mg Oral BID Opyd, Timothy S, MD   2.5 mg at 02/19/24 1048   atorvastatin  (LIPITOR) tablet 20 mg  20 mg Oral Daily Opyd, Timothy S, MD   20 mg at 02/19/24 1048   cyclobenzaprine  (FLEXERIL ) tablet 5 mg  5 mg Oral TID PRN Arlon Carliss ORN, DO   5 mg at 02/18/24 2133   insulin  aspart (novoLOG ) injection 0-5 Units  0-5 Units Subcutaneous QHS Opyd, Timothy S, MD       insulin  aspart (novoLOG ) injection 0-6 Units  0-6 Units Subcutaneous TID WC Opyd, Timothy S, MD   1 Units at 02/19/24 1158   insulin  glargine (LANTUS ) injection 5 Units  5 Units Subcutaneous BID Arlon Carliss W, DO       irbesartan  (AVAPRO ) tablet 300 mg  300 mg Oral Daily Opyd, Timothy S, MD   300 mg at 02/19/24 1048   levothyroxine  (SYNTHROID ) tablet 125 mcg  125 mcg Oral QAC breakfast Opyd, Timothy S, MD   125 mcg at 02/19/24 0557   metoprolol  succinate (TOPROL -XL) 24 hr tablet 25 mg  25 mg Oral Daily Opyd, Timothy S, MD   25 mg at 02/19/24 1048   oxyCODONE  (Oxy IR/ROXICODONE ) immediate release tablet 5 mg  5 mg Oral Q4H PRN Arlon Carliss ORN, DO   5 mg at 02/18/24 2226   senna (SENOKOT) tablet 8.6 mg  1 tablet Oral Daily PRN Opyd, Timothy S, MD       sodium chloride  flush (NS) 0.9 % injection 3 mL  3 mL Intravenous Q12H Opyd, Timothy S, MD   3 mL at 02/19/24 1055     Discharge Medications: Please see discharge summary for a list of discharge medications.  Relevant Imaging Results:  Relevant Lab Results:   Additional Information SSN #[id number].  Rosalva Jon Bloch, RN

## 2024-02-19 NOTE — Progress Notes (Signed)
 Occupational Therapy Treatment Patient Details Name: Summer Hawkins MRN: [id number] DOB: Feb 13, 1935 Today's Date: 02/19/2024   History of present illness 89 yo F adm 12/7 s/p fall with pain back shoulders and hip Pt with workup for mild TBI. Pt currently with breast CA treatment.  PMH: breast cancer, CKD III, DMII, HTN, pacemaker, RC repair. Atrial fib on eliquis , back surgery with residual LLE weakness   OT comments  Pt is slowly progressing towards OT goals. Focus of session on progressing functional mobility and increasing independent engagement in ADL tasks. Pt required up to Max A for functional transfers this date and continues to require Max A for ADL tasks. Pt expressed concern related to returning home alone. Based on Pt current level of function, OT to recommend continued inpatient follow up therapy, <3 hours/day with goal of safe return home.       If plan is discharge home, recommend the following:  A lot of help with walking and/or transfers;A lot of help with bathing/dressing/bathroom;Assistance with cooking/housework;Assist for transportation;Help with stairs or ramp for entrance   Equipment Recommendations  Other (comment) (defer to next venue)    Recommendations for Other Services      Precautions / Restrictions Precautions Precautions: Fall Recall of Precautions/Restrictions: Intact Precaution/Restrictions Comments: recent hx of vasovagal or syncopal type episodes, correlating with back pain/spasms while moving Restrictions Weight Bearing Restrictions Per Provider Order: No       Mobility Bed Mobility Overal bed mobility: Needs Assistance Bed Mobility: Rolling, Sidelying to Sit Rolling: Mod assist, Used rails Sidelying to sit: Max assist   Sit to supine: Mod assist   General bed mobility comments: Max A to sit on EOB on L side. Pt required management of BLE and Max A to elevate trunk from bed. Pt required Mod A to return to bed for management of BLE and  repositioning    Transfers Overall transfer level: Needs assistance Equipment used: Rolling walker (2 wheels) Transfers: Sit to/from Stand Sit to Stand: Mod assist, From elevated surface           General transfer comment: Mod A to power up from bed slightly elevated. Pt required Mod A to take two side steps to the L towards HOB. Increased need for management of RW and cues for initiating steps.     Balance Overall balance assessment: Needs assistance Sitting-balance support: Single extremity supported, Feet supported Sitting balance-Leahy Scale: Fair Sitting balance - Comments: close supervision   Standing balance support: Bilateral upper extremity supported, During functional activity, Reliant on assistive device for balance Standing balance-Leahy Scale: Poor Standing balance comment: Dependent on RW and external support                           ADL either performed or assessed with clinical judgement   ADL Overall ADL's : Needs assistance/impaired Eating/Feeding: Set up;Sitting   Grooming: Minimal assistance;Sitting   Upper Body Bathing: Minimal assistance;Sitting   Lower Body Bathing: Maximal assistance;Sitting/lateral leans   Upper Body Dressing : Minimal assistance;Sitting   Lower Body Dressing: Maximal assistance;Sitting/lateral leans   Toilet Transfer: Moderate assistance;Stand-pivot;BSC/3in1   Toileting- Clothing Manipulation and Hygiene: Total assistance Toileting - Clothing Manipulation Details (indicate cue type and reason): purewick            Extremity/Trunk Assessment Upper Extremity Assessment Upper Extremity Assessment: Generalized weakness            Vision       Perception  Praxis     Communication Communication Communication: Impaired Factors Affecting Communication: Hearing impaired   Cognition Arousal: Alert Behavior During Therapy: Anxious Cognition: No apparent impairments             OT - Cognition  Comments: Pt anxious regarding return home                 Following commands: Impaired Following commands impaired: Follows one step commands with increased time      Cueing   Cueing Techniques: Verbal cues, Gestural cues, Visual cues  Exercises      Shoulder Instructions       General Comments Educated on LB AE.    Pertinent Vitals/ Pain       Pain Assessment Pain Assessment: Faces Faces Pain Scale: Hurts even more Pain Location: lower back Pain Descriptors / Indicators: Moaning, Discomfort, Grimacing Pain Intervention(s): Limited activity within patient's tolerance, Monitored during session, Repositioned  Home Living Family/patient expects to be discharged to:: Private residence Living Arrangements: Alone                                      Prior Functioning/Environment              Frequency  Min 2X/week        Progress Toward Goals  OT Goals(current goals can now be found in the care plan section)  Progress towards OT goals: Progressing toward goals  Acute Rehab OT Goals OT Goal Formulation: With patient Time For Goal Achievement: 03/03/24 Potential to Achieve Goals: Good ADL Goals Pt Will Perform Grooming: with modified independence;standing Pt Will Perform Lower Body Bathing: with modified independence;sit to/from stand Pt Will Perform Lower Body Dressing: with modified independence;sit to/from stand Pt Will Transfer to Toilet: with modified independence;ambulating;bedside commode Pt Will Perform Toileting - Clothing Manipulation and hygiene: with modified independence;sit to/from stand Additional ADL Goal #1: Pt will be aware of AE for LB bathing and dressing. Additional ADL Goal #2: Pt will complete bed mobility mod I in preparation for ADLs.  Plan      Co-evaluation                 AM-PAC OT 6 Clicks Daily Activity     Outcome Measure   Help from another person eating meals?: A Little Help from another  person taking care of personal grooming?: A Little Help from another person toileting, which includes using toliet, bedpan, or urinal?: A Lot Help from another person bathing (including washing, rinsing, drying)?: A Lot Help from another person to put on and taking off regular upper body clothing?: A Little Help from another person to put on and taking off regular lower body clothing?: A Lot 6 Click Score: 15    End of Session Equipment Utilized During Treatment: Gait belt;Rolling walker (2 wheels)  OT Visit Diagnosis: Unsteadiness on feet (R26.81);Other abnormalities of gait and mobility (R26.89);Pain;Muscle weakness (generalized) (M62.81)   Activity Tolerance Patient limited by lethargy;Patient limited by pain   Patient Left in bed;with call bell/phone within reach   Nurse Communication          Time: 8892-8854 OT Time Calculation (min): 38 min  Charges: OT General Charges $OT Visit: 1 Visit OT Treatments $Therapeutic Activity: 38-52 mins  Maurilio CROME, OTR/L.  [facility] Acute Rehabilitation  Office: [phone]   Maurilio PARAS Melodi Happel 02/19/2024, 1:54 PM

## 2024-02-20 ENCOUNTER — Inpatient Hospital Stay (HOSPITAL_COMMUNITY)

## 2024-02-20 ENCOUNTER — Other Ambulatory Visit: Payer: Self-pay | Admitting: *Deleted

## 2024-02-20 ENCOUNTER — Telehealth: Payer: Self-pay | Admitting: *Deleted

## 2024-02-20 DIAGNOSIS — I6521 Occlusion and stenosis of right carotid artery: Secondary | ICD-10-CM

## 2024-02-20 DIAGNOSIS — M549 Dorsalgia, unspecified: Secondary | ICD-10-CM

## 2024-02-20 DIAGNOSIS — R55 Syncope and collapse: Secondary | ICD-10-CM

## 2024-02-20 LAB — CBC
HCT: 42.7 % (ref 36.0–46.0)
Hemoglobin: 13.8 g/dL (ref 12.0–15.0)
MCH: 29.7 pg (ref 26.0–34.0)
MCHC: 32.3 g/dL (ref 30.0–36.0)
MCV: 92 fL (ref 80.0–100.0)
Platelets: 327 K/uL (ref 150–400)
RBC: 4.64 MIL/uL (ref 3.87–5.11)
RDW: 14 % (ref 11.5–15.5)
WBC: 11.4 K/uL — ABNORMAL HIGH (ref 4.0–10.5)
nRBC: 0 % (ref 0.0–0.2)

## 2024-02-20 LAB — BASIC METABOLIC PANEL WITH GFR
Anion gap: 10 (ref 5–15)
BUN: 29 mg/dL — ABNORMAL HIGH (ref 8–23)
CO2: 26 mmol/L (ref 22–32)
Calcium: 9.2 mg/dL (ref 8.9–10.3)
Chloride: 98 mmol/L (ref 98–111)
Creatinine, Ser: 1.75 mg/dL — ABNORMAL HIGH (ref 0.44–1.00)
GFR, Estimated: 28 mL/min — ABNORMAL LOW (ref >60)
Glucose, Bld: 210 mg/dL — ABNORMAL HIGH (ref 70–99)
Potassium: 4.9 mmol/L (ref 3.5–5.1)
Sodium: 134 mmol/L — ABNORMAL LOW (ref 135–145)

## 2024-02-20 LAB — GLUCOSE, CAPILLARY
Glucose-Capillary: 146 mg/dL — ABNORMAL HIGH (ref 70–99)
Glucose-Capillary: 208 mg/dL — ABNORMAL HIGH (ref 70–99)
Glucose-Capillary: 223 mg/dL — ABNORMAL HIGH (ref 70–99)
Glucose-Capillary: 249 mg/dL — ABNORMAL HIGH (ref 70–99)

## 2024-02-20 NOTE — Progress Notes (Signed)
 Mobility Specialist Progress Note:    02/20/24 1439  Mobility  Activity Ambulated with assistance (In hallway)  Level of Assistance Moderate assist, patient does 50-74% (+2 and chair follow)  Assistive Device Front wheel walker  Distance Ambulated (ft) 75 ft  Activity Response Tolerated well  Mobility Referral Yes  Mobility visit 1 Mobility  Mobility Specialist Start Time (ACUTE ONLY) 1422  Mobility Specialist Stop Time (ACUTE ONLY) 1437  Mobility Specialist Time Calculation (min) (ACUTE ONLY) 15 min   Received pt EOB and agreeable to mobility. Pt required ModA+2 STS and chair follow for safety. No c/o. Pt had x1 seated rest break and returned to room via chair. Assisted pt to bed. Personal belongings and call light within reach. All needs met.  Lavanda Pollack Mobility Specialist  Please contact via [employer] or  Rehab Office [phone]

## 2024-02-20 NOTE — Progress Notes (Signed)
 Carotid artery duplex has been completed. Preliminary results can be found in CV Proc through chart review.   02/20/24 11:04 AM Cathlyn Collet RVT

## 2024-02-20 NOTE — Telephone Encounter (Signed)
 121025/note to staff.  Patient lives alone.  Reporting that she does have dizzy spells.  Does not admit to falls due to this.  Possible plan is for rehab for dc.  [facility]regational RN feels like this ould be an excellent outcome.

## 2024-02-20 NOTE — TOC Progression Note (Signed)
 Transition of Care [facility]) - Progression Note    Patient Details  Name: Summer Hawkins MRN: [id number] Date of Birth: February 17, 1935  [facility] CM/SW Contact  Bridget Cordella Simmonds, LCSW Phone Number: 02/20/2024, 1:41 PM  Clinical Narrative:   CSW spoke with pt regarding SNF choice: she accepts offer at [facility].  CSW confirmed with [facility]: they can receive pt.  Medicare payer with inpt order 02/18/24.    Expected Discharge Plan: Skilled Nursing Facility Barriers to Discharge: Other (must enter comment) (SNF workup)               Expected Discharge Plan and Services                                   HH Arranged: PT, OT, RN [facility] Agency: [facility] Date [place] Agency Contacted: 02/19/24 Time HH Agency Contacted: [phone] Representative spoke with at [facility] Agency: [facility]   Social Drivers of Health (SDOH) Interventions SDOH Screenings   Food Insecurity: Unknown (02/19/2024)  Housing: Unknown (02/19/2024)  Transportation Needs: No Transportation Needs (02/19/2024)  Utilities: Not At Risk (02/19/2024)  Depression (PHQ2-9): Low Risk  (01/28/2024)  Social Connections: Moderately Isolated (02/19/2024)  Tobacco Use: Low Risk  (02/18/2024)    Readmission Risk Interventions     No data to display

## 2024-02-20 NOTE — Progress Notes (Signed)
 Progress Note   Patient: Summer Hawkins FMW:[id number] DOB: 1934-09-11 DOA: 02/17/2024  DOS: the patient was seen and examined on 02/20/2024   Brief hospital course:  89 y.o. female with medical history significant for hypertension, type 2 diabetes mellitus, atrial fibrillation on Eliquis , CKD stage IV, symptomatic bradycardia with pacemaker, chronic, left leg weakness, and breast cancer who presents with headache and pain in her back, shoulders, and hips after a fall.    Assessment and Plan:   Possible orthostatic hypotension and syncope - Patient relating that she had passed out yesterday.  Had an episode of orthostatic syncope this morning getting up from the bed with OT.  IV fluid bolus initiated.  Will discontinue amlodipine .  Monitor closely.   Multiple falls with intractable pain - No acute findings on trauma imaging.  Pain control and cyclobenzaprine  added.  Showing marked improvement.  Evaluated by PT/OT.  Initially recommending home health however repeat evaluation showing needing max assist with some tasks.  Patient lives alone at home.  Likely pursue SNF.   Atrial fibrillation - Eliquis  and metoprolol  on board.   Diabetes mellitus - Insulin  sliding scale.   Hypertension - Amlodipine  discontinued.  Continue ARB.  Right carotid stenosis - Carotid ultrasound suggestive of right high-grade stenosis.  Already on Eliquis .  May benefit from outpatient consultation for endarterectomy.   CKD 4 - Creatinine appears around baseline.  Will monitor urine output recheck BMP in AM.   Breast cancer - Followed by Dr. Lanny.  Currently on anastrozole .  Scheduled to start radiation this week per patient.   Subjective: Patient feeling well this morning.  Has not yet moved around with PT.  Motivated to go home if possible but is willing to capitulate to SNF if she is too weak to be home by herself.  Denies any fever, chills, chest pain, nausea, vomiting, abdominal pain.  Physical  Exam:  Vitals:   02/19/24 1752 02/19/24 1940 02/20/24 0413 02/20/24 0727  BP:  (!) 152/69 (!) 160/82 (!) 158/86  Pulse:  70 70 69  Resp:  15 16 16   Temp:  98.5 F (36.9 C) 98.5 F (36.9 C) 97.8 F (36.6 C)  TempSrc:  Oral  Oral  SpO2:  96% 95% 91%  Height: 5' 4 (1.626 m)       GENERAL:  Alert, pleasant, no acute distress, frail HEENT:  EOMI CARDIOVASCULAR: Irregularly irregular RESPIRATORY:  Clear to auscultation, no wheezing, rales, or rhonchi GASTROINTESTINAL:  Soft, nontender, nondistended EXTREMITIES:  No LE edema bilaterally NEURO:  No new focal deficits appreciated SKIN:  No rashes noted PSYCH:  Appropriate mood and affect    Data Reviewed:  Imaging Studies: VAS US  CAROTID Result Date: 02/20/2024 Carotid Arterial Duplex Study Patient Name:  Summer Hawkins  Date of Exam:   02/20/2024 Medical Rec #: [id number]       Accession #:    [id number] Date of Birth: 11/28/1934       Patient Gender: F Patient Age:   57 years Exam Location:  [facility] Procedure:      VAS US  CAROTID Referring Phys: CARLISS CANALES --------------------------------------------------------------------------------  Indications:       Syncope. Risk Factors:      Hypertension, Diabetes. Comparison Study:  No prior studies. Performing Technologist: Cordella Collet RVT  Examination Guidelines: A complete evaluation includes B-mode imaging, spectral Doppler, color Doppler, and power Doppler as needed of all accessible portions of each vessel. Bilateral testing is considered an integral part of a complete examination.  Limited examinations for reoccurring indications may be performed as noted.  Right Carotid Findings: +----------+-------+--------+--------+---------------------+-------------------+           PSV    EDV cm/sStenosisPlaque Description   Comments                      cm/s                                                             +----------+-------+--------+--------+---------------------+-------------------+ CCA Prox  45     5                                                        +----------+-------+--------+--------+---------------------+-------------------+ CCA Distal31     4               smooth and                                                                heterogenous                             +----------+-------+--------+--------+---------------------+-------------------+ ICA Prox  15     0                                    High resistant flow +----------+-------+--------+--------+---------------------+-------------------+ ICA Mid   11                                          High resistant flow +----------+-------+--------+--------+---------------------+-------------------+ ICA Distal12     0                                    Highl resistant                                                           flow                +----------+-------+--------+--------+---------------------+-------------------+ ECA       55     8                                                        +----------+-------+--------+--------+---------------------+-------------------+ +----------+--------+-------+--------+-------------------+           PSV cm/sEDV cmsDescribeArm Pressure (  mmHG) +----------+--------+-------+--------+-------------------+ [id number]                                         +----------+--------+-------+--------+-------------------+ +---------+--------+--+--------+-+---------+ VertebralPSV cm/s30EDV cm/s5Antegrade +---------+--------+--+--------+-+---------+  Left Carotid Findings: +----------+--------+--------+--------+-----------------------+--------+           PSV cm/sEDV cm/sStenosisPlaque Description     Comments +----------+--------+--------+--------+-----------------------+--------+ CCA Prox  50      10                                               +----------+--------+--------+--------+-----------------------+--------+ CCA Distal49      12              smooth and heterogenous         +----------+--------+--------+--------+-----------------------+--------+ ICA Prox  42      17              smooth and heterogenous         +----------+--------+--------+--------+-----------------------+--------+ ICA Mid   58      21                                     tortuous +----------+--------+--------+--------+-----------------------+--------+ ICA Distal52      13                                     tortuous +----------+--------+--------+--------+-----------------------+--------+ ECA       58      6                                               +----------+--------+--------+--------+-----------------------+--------+ +----------+--------+--------+--------+-------------------+           PSV cm/sEDV cm/sDescribeArm Pressure (mmHG) +----------+--------+--------+--------+-------------------+ Subclavian89                                          +----------+--------+--------+--------+-------------------+ +---------+--------+--+--------+-+---------+ VertebralPSV cm/s31EDV cm/s9Antegrade +---------+--------+--+--------+-+---------+   Summary: Right Carotid: High resistant flow is noted in the proximal, mid, and distal                aspects of the ICA. High resistant flow is suggestive of a distal                high grade stenosis/occlusion. Left Carotid: Velocities in the left ICA are consistent with a 1-39% stenosis. Vertebrals: Bilateral vertebral arteries demonstrate antegrade flow. *See table(s) above for measurements and observations.     Preliminary    CT T-SPINE NO CHARGE Result Date: 02/17/2024 EXAM: CT THORACIC SPINE WITHOUT CONTRAST 02/17/2024 09:16:19 PM TECHNIQUE: CT of the thoracic spine was performed without the administration of intravenous contrast. Multiplanar reformatted images are provided for  review. Automated exposure control, iterative reconstruction, and/or weight based adjustment of the mA/kV was utilized to reduce the radiation dose to as low as reasonably achievable. COMPARISON: None available. CLINICAL HISTORY: FINDINGS: BONES AND ALIGNMENT: Osseous structures are diffusely osteopenic. Mild thoracic dextrocurvature. Normal thoracic kyphosis. No  listhesis. Normal vertebral body heights. No acute fracture of the thoracic spine. No suspicious bone lesion. DEGENERATIVE CHANGES: Changes of advanced degenerative disc disease with bridging disc osteophytes involving the vertebral bodies of T4-T11. Endplate remodeling and vacuum disc phenomenon at T11-12 in keeping with change of advanced degenerative disc disease. No high-grade canal stenosis. No high-grade neuroforaminal narrowing. SOFT TISSUES: No paraspinal fluid collection or inflammatory change identified. No acute abnormality. IMPRESSION: 1. No acute abnormality of the thoracic spine. 2. Advanced degenerative disc disease with bridging disc osteophytes involving T4-T11, with endplate remodeling and vacuum disc phenomenon at T11-12, without high-grade canal stenosis or neuroforaminal narrowing. 3. Diffuse osteopenia. Electronically signed by: Dorethia Molt MD 02/17/2024 09:38 PM EST RP Workstation: [id number]   CT L-SPINE NO CHARGE Result Date: 02/17/2024 EXAM: CT OF THE LUMBAR SPINE WITHOUT CONTRAST 02/17/2024 09:16:19 PM TECHNIQUE: CT of the lumbar spine was performed without the administration of intravenous contrast. Multiplanar reformatted images are provided for review. Automated exposure control, iterative reconstruction, and/or weight based adjustment of the mA/kV was utilized to reduce the radiation dose to as low as reasonably achievable. COMPARISON: None available. CLINICAL HISTORY: FINDINGS: BONES AND ALIGNMENT: L3-L5 anterior and posterior lumbar fusion with instrumentation. Prosthetic lucencies surrounding the right L3 pedicle screw  suggest motion at this level. No definite bridging callus identified involving the vertebral body right L3-4 facet joint. Left L3 hemilaminectomy. Bilateral L4 laminotomy. Mild straightening along the fused segments. Normal vertebral body heights. No acute fracture. No listhesis. No suspicious bone lesion. DEGENERATIVE CHANGES: Ankylosis of the facet joints bilaterally at L4-5. No high-grade canal stenosis. No high-grade neural foraminal narrowing. SOFT TISSUES: No acute abnormality. IMPRESSION: 1. No acute findings. 2. L3-L5 anterior and posterior lumbar fusion with instrumentation, with periprosthetic lucency surrounding the right L3 pedicle screw suggesting motion at this level, and no definite bridging callus identified involving the vertebral body right L3-4 facet joint. 3. Left L3 hemilaminectomy and bilateral L4 laminotomy. 4. Ankylosis of the facet joints bilaterally at L4-5. Electronically signed by: Dorethia Molt MD 02/17/2024 09:35 PM EST RP Workstation: [id number]   CT CHEST ABDOMEN PELVIS WO CONTRAST Result Date: 02/17/2024 EXAM: CT CHEST, ABDOMEN AND PELVIS WITHOUT CONTRAST 02/17/2024 09:16:19 PM TECHNIQUE: CT of the chest, abdomen and pelvis was performed without the administration of intravenous contrast. Multiplanar reformatted images are provided for review. Automated exposure control, iterative reconstruction, and/or weight based adjustment of the mA/kV was utilized to reduce the radiation dose to as low as reasonably achievable. COMPARISON: Prior examination of 12/09/2014. CLINICAL HISTORY: Fall backwards, head injury, neck and back pain. FINDINGS: CHEST: MEDIASTINUM AND LYMPH NODES: Looks like a subclavian single lead pacemaker is in place, but its lead is within the right ventricle toward the basilar septum. Global cardiac size is at the upper limits of normal. Extensive multivessel coronary artery calcification. No pericardial effusion. The pulmonary arteries are of normal caliber. Moderate  atherosclerotic calcification within the thoracic aorta. No aortic aneurysm. The central airways are clear. No pathologic thoracic adenopathy. The esophagus is unremarkable. Visualized thyroid  is unremarkable. LUNGS AND PLEURA: No focal consolidation or pulmonary edema. No pleural effusion or pneumothorax. ABDOMEN AND PELVIS: LIVER: The liver is unremarkable. GALLBLADDER AND BILE DUCTS: Status post cholecystectomy. No biliary ductal dilatation. SPLEEN: No acute abnormality. PANCREAS: No acute abnormality. ADRENAL GLANDS: No acute abnormality. KIDNEYS, URETERS AND BLADDER: 10 mm exophytic lesion arising from the anterior interpolar region of the right kidney demonstrates a mean density of 71 Hounsfield units, compatible with a hyperdense cortical cyst. The  kidneys are otherwise unremarkable. No stones in the kidneys or ureters. No hydronephrosis. No perinephric or periureteral stranding. Urinary bladder is unremarkable. GI AND BOWEL: Few scattered diverticula are seen within the sigmoid colon without superimposed acute inflammatory change. The appendix is normal. The stomach, small bowel, and large bowel are otherwise unremarkable. There is no bowel obstruction. REPRODUCTIVE ORGANS: Status post hysterectomy. No adnexal mass. PERITONEUM AND RETROPERITONEUM: No ascites. No free air. Tiny fat-containing umbilical hernia. VASCULATURE: Aorta is normal in caliber. Iliac atherosclerotic calcification. No aortic aneurysm. ABDOMINAL AND PELVIS LYMPH NODES: No lymphadenopathy. BONES AND SOFT TISSUES: Soft tissue infiltration within the right breast with associated surgical clips noted, consistent with surgical changes of partial right breast resection noted on 01/14/2024. Advanced degenerative changes are seen within the thoracic spine. L3-L5 lumbar fusion with instrumentation has been performed. Advanced asymmetric degenerative changes are seen within the left hip. Osseous structures are diffusely osteopenic. No acute bone  abnormality within the thorax, abdomen, and pelvis. No focal soft tissue abnormality in the abdomen and pelvis. IMPRESSION: 1. No acute abnormality of the chest, abdomen, and pelvis related to the fall with head, neck, and back pain. 2. Single-lead right ventricular pacemaker in place. Global cardiac size at the upper limits of normal. Extensive multivessel coronary artery calcification 3. Postsurgical changes of partial right breast resection with associated soft tissue infiltration and surgical clips. Electronically signed by: Dorethia Molt MD 02/17/2024 09:28 PM EST RP Workstation: [id number]   CT Head Wo Contrast Result Date: 02/17/2024 EXAM: CT HEAD AND CERVICAL SPINE 02/17/2024 07:58:18 PM TECHNIQUE: CT of the head and cervical spine was performed without the administration of intravenous contrast. Multiplanar reformatted images are provided for review. Automated exposure control, iterative reconstruction, and/or weight based adjustment of the mA/kV was utilized to reduce the radiation dose to as low as reasonably achievable. COMPARISON: None available. CLINICAL HISTORY: trauma trauma FINDINGS: CT HEAD BRAIN AND VENTRICLES: No acute intracranial hemorrhage. No mass effect or midline shift. No abnormal extra-axial fluid collection. No evidence of acute infarct. No hydrocephalus. Cerebral atrophy. ORBITS: No acute abnormality. SINUSES AND MASTOIDS: No acute abnormality. SOFT TISSUES AND SKULL: No acute skull fracture. No acute soft tissue abnormality. CT CERVICAL SPINE BONES AND ALIGNMENT: No acute fracture or traumatic malalignment. DEGENERATIVE CHANGES: Multilevel degenerative changes greatest at C5-C6 where there is severe degenerative disc disease with disc height loss and endplate spurring. Also, facet/uncovertebral hypertrophy at this level with potentially severe foraminal stenosis. SOFT TISSUES: No prevertebral soft tissue swelling. IMPRESSION: 1. No acute intracranial abnormality. 2. No acute  fracture or traumatic malalignment of the cervical spine. 3. Severe C5-C6 degenerative change. Electronically signed by: Gilmore Molt MD 02/17/2024 08:09 PM EST RP Workstation: [id number]   CT Cervical Spine Wo Contrast Result Date: 02/17/2024 EXAM: CT HEAD AND CERVICAL SPINE 02/17/2024 07:58:18 PM TECHNIQUE: CT of the head and cervical spine was performed without the administration of intravenous contrast. Multiplanar reformatted images are provided for review. Automated exposure control, iterative reconstruction, and/or weight based adjustment of the mA/kV was utilized to reduce the radiation dose to as low as reasonably achievable. COMPARISON: None available. CLINICAL HISTORY: trauma trauma FINDINGS: CT HEAD BRAIN AND VENTRICLES: No acute intracranial hemorrhage. No mass effect or midline shift. No abnormal extra-axial fluid collection. No evidence of acute infarct. No hydrocephalus. Cerebral atrophy. ORBITS: No acute abnormality. SINUSES AND MASTOIDS: No acute abnormality. SOFT TISSUES AND SKULL: No acute skull fracture. No acute soft tissue abnormality. CT CERVICAL SPINE BONES AND ALIGNMENT: No acute fracture or  traumatic malalignment. DEGENERATIVE CHANGES: Multilevel degenerative changes greatest at C5-C6 where there is severe degenerative disc disease with disc height loss and endplate spurring. Also, facet/uncovertebral hypertrophy at this level with potentially severe foraminal stenosis. SOFT TISSUES: No prevertebral soft tissue swelling. IMPRESSION: 1. No acute intracranial abnormality. 2. No acute fracture or traumatic malalignment of the cervical spine. 3. Severe C5-C6 degenerative change. Electronically signed by: Gilmore Molt MD 02/17/2024 08:09 PM EST RP Workstation: [id number]   DG Chest Port 1 View Result Date: 02/17/2024 EXAM: 1 VIEW(S) XRAY OF THE CHEST 02/17/2024 06:39:00 PM COMPARISON: 12/23/2020 CLINICAL HISTORY: Trauma Trauma FINDINGS: LUNGS AND PLEURA: No focal pulmonary opacity.  No pleural effusion. No pneumothorax. HEART AND MEDIASTINUM: Stable cardiomegaly. Left-sided pacemaker is unchanged. BONES AND SOFT TISSUES: No acute osseous abnormality. IMPRESSION: 1. No acute cardiopulmonary process. Stable cardiomegaly and unchanged left-sided pacemaker. Electronically signed by: Lynwood Seip MD 02/17/2024 06:44 PM EST RP Workstation: [id number]   DG Pelvis Portable Result Date: 02/17/2024 EXAM: 1 or 2 VIEW(S) XRAY OF THE PELVIS 02/17/2024 06:39:00 PM COMPARISON: None available. CLINICAL HISTORY: Trauma FINDINGS: BONES AND JOINTS: No acute fracture. No malalignment. Severe degenerative change is seen involving the left hip joint. SOFT TISSUES: The soft tissues are unremarkable. IMPRESSION: 1. No evidence of acute traumatic injury. 2. Severe left hip osteoarthritis. Electronically signed by: Lynwood Seip MD 02/17/2024 06:43 PM EST RP Workstation: [id number]    There are no new results to review at this time.  Previous records (including but not limited to H&P, progress notes, nursing notes, TOC management) were reviewed in assessment of this patient.  Labs: CBC: Recent Labs  Lab 02/17/24 1817 02/17/24 1820 02/18/24 0531 02/19/24 0549 02/20/24 0751  WBC 10.6*  --  11.1* 10.1 11.4*  HGB 13.4 15.3* 12.7 13.3 13.8  HCT 42.4 45.0 39.5 41.0 42.7  MCV 93.6  --  92.1 92.3 92.0  PLT 349  --  315 314 327   Basic Metabolic Panel: Recent Labs  Lab 02/17/24 1817 02/17/24 1820 02/18/24 0531 02/19/24 0549 02/20/24 0751  NA 134* 135 138 135 134*  K 4.4 4.4 4.3 4.4 4.9  CL 99 101 106 102 98  CO2 22  --  21* 17* 26  GLUCOSE 310* 314* 154* 85 210*  BUN 39* 41* 33* 31* 29*  CREATININE 2.09* 2.20* 1.72* 1.86* 1.75*  CALCIUM  9.0  --  8.8* 8.7* 9.2   Liver Function Tests: Recent Labs  Lab 02/17/24 1817  AST 19  ALT 14  ALKPHOS 115  BILITOT 0.8  PROT 7.9  ALBUMIN 3.8   CBG: Recent Labs  Lab 02/19/24 1118 02/19/24 1602 02/19/24 2130 02/20/24 0652 02/20/24 1128   GLUCAP 183* 193* 183* 146* 208*    Scheduled Meds:  anastrozole   1 mg Oral Daily   apixaban   2.5 mg Oral BID   atorvastatin   20 mg Oral Daily   insulin  aspart  0-5 Units Subcutaneous QHS   insulin  aspart  0-6 Units Subcutaneous TID WC   insulin  glargine  5 Units Subcutaneous BID   irbesartan   300 mg Oral Daily   levothyroxine   125 mcg Oral QAC breakfast   metoprolol  succinate  25 mg Oral Daily   sodium chloride  flush  3 mL Intravenous Q12H   Continuous Infusions: PRN Meds:.acetaminophen  **OR** acetaminophen , cyclobenzaprine , oxyCODONE , senna  Family Communication: None at bedside  Disposition: Status is: Inpatient Remains inpatient appropriate because: Falls, syncope     Time spent: 33 minutes  Length of inpatient stay: 2 days  Author:  Carliss LELON Canales, DO 02/20/2024 1:31 PM  For on call review [email or web address].

## 2024-02-20 NOTE — Plan of Care (Signed)

## 2024-02-21 ENCOUNTER — Ambulatory Visit: Admitting: Physician Assistant

## 2024-02-21 DIAGNOSIS — I951 Orthostatic hypotension: Secondary | ICD-10-CM

## 2024-02-21 LAB — GLUCOSE, CAPILLARY
Glucose-Capillary: 176 mg/dL — ABNORMAL HIGH (ref 70–99)
Glucose-Capillary: 186 mg/dL — ABNORMAL HIGH (ref 70–99)
Glucose-Capillary: 189 mg/dL — ABNORMAL HIGH (ref 70–99)
Glucose-Capillary: 169 mg/dL — ABNORMAL HIGH (ref 70–99)
Glucose-Capillary: 171 mg/dL — ABNORMAL HIGH (ref 70–99)

## 2024-02-21 LAB — BLOOD GAS, VENOUS
Acid-Base Excess: 4.5 mmol/L — ABNORMAL HIGH (ref 0.0–2.0)
Bicarbonate: 29.8 mmol/L — ABNORMAL HIGH (ref 20.0–28.0)
O2 Saturation: 50.4 %
Patient temperature: 37
pCO2, Ven: 46 mmHg (ref 44–60)
pH, Ven: 7.42 (ref 7.25–7.43)
pO2, Ven: <31 mmHg — CL (ref 32–45)

## 2024-02-21 LAB — URINALYSIS, ROUTINE W REFLEX MICROSCOPIC
Bilirubin Urine: NEGATIVE
Glucose, UA: NEGATIVE mg/dL
Ketones, ur: NEGATIVE mg/dL
Nitrite: POSITIVE — AB
Protein, ur: 30 mg/dL — AB
Specific Gravity, Urine: 1.01 (ref 1.005–1.030)
pH: 6 (ref 5.0–8.0)

## 2024-02-21 LAB — CBC
HCT: 43.6 % (ref 36.0–46.0)
Hemoglobin: 14.4 g/dL (ref 12.0–15.0)
MCH: 29.6 pg (ref 26.0–34.0)
MCHC: 33 g/dL (ref 30.0–36.0)
MCV: 89.5 fL (ref 80.0–100.0)
Platelets: 339 K/uL (ref 150–400)
RBC: 4.87 MIL/uL (ref 3.87–5.11)
RDW: 13.9 % (ref 11.5–15.5)
WBC: 12.2 K/uL — ABNORMAL HIGH (ref 4.0–10.5)
nRBC: 0 % (ref 0.0–0.2)

## 2024-02-21 LAB — URINALYSIS, MICROSCOPIC (REFLEX): WBC, UA: >50 WBC/hpf (ref 0–5)

## 2024-02-21 LAB — BASIC METABOLIC PANEL WITH GFR
Anion gap: 8 (ref 5–15)
BUN: 30 mg/dL — ABNORMAL HIGH (ref 8–23)
CO2: 27 mmol/L (ref 22–32)
Calcium: 9.1 mg/dL (ref 8.9–10.3)
Chloride: 98 mmol/L (ref 98–111)
Creatinine, Ser: 1.72 mg/dL — ABNORMAL HIGH (ref 0.44–1.00)
GFR, Estimated: 28 mL/min — ABNORMAL LOW (ref >60)
Glucose, Bld: 177 mg/dL — ABNORMAL HIGH (ref 70–99)
Potassium: 4.2 mmol/L (ref 3.5–5.1)
Sodium: 133 mmol/L — ABNORMAL LOW (ref 135–145)

## 2024-02-21 LAB — TSH: TSH: 45.24 u[IU]/mL — ABNORMAL HIGH (ref 0.350–4.500)

## 2024-02-21 LAB — FOLATE: Folate: >20 ng/mL (ref >5.9)

## 2024-02-21 LAB — VITAMIN B12: Vitamin B-12: 1448 pg/mL — ABNORMAL HIGH (ref 180–914)

## 2024-02-21 NOTE — Plan of Care (Signed)
   Problem: Education: Goal: Ability to describe self-care measures that may prevent or decrease complications (Diabetes Survival Skills Education) will improve Outcome: Progressing Goal: Individualized Educational Video(s) Outcome: Progressing   Problem: Coping: Goal: Ability to adjust to condition or change in health will improve Outcome: Progressing

## 2024-02-21 NOTE — Progress Notes (Deleted)
 Cardiology Office Note Date:  02/21/2024  Patient ID:  Summer, Hawkins [birth year], MRN [id number] PCP:  Vernadine Charlie ORN, MD  Cardiologist:  Dr. Verlin Electrophysiologist: Dr. Inocencio     Chief Complaint:  *** annual visit  History of Present Illness: Summer Hawkins is a 89 y.o. female with history of  HTN, HLD, DM, obesity, CKD III, hypothyroidism. permanent AFib, chronic CHF (diastolic) stroke Symptomatic bradycardia w/PPM  She saw Dr. Waddell Nov 2024, trouble with macular degeneration,and a recent back surgery with poor healing. Pacer functioning normally No changes made  Last saw cards team 12/11/23, pending lumpectomy. Did discuss June 2025 stroke despite appropriately dosed Eliquis  and medication compliance.  In d/w MD, no changes recommended  Admitted 02/18/24, after a mechanical fall, multiple orthopedic pain, during her stay suffered a syncopal event, described as orthostatic. ***  Pending start of XRT for her breast cancer (R side)  TODAY  *** eliquis , dose, bleeding, labs *** rates *** symptoms *** given distance from PPM, no particular device concerns, though would see mid and at completion of therapy  Device information MDT dual chamber PPM with HIS and RV lead, implanted 12/02/2018, Dr. Waddell  Past Medical History:  Diagnosis Date   Atrial fibrillation (HCC)    Breast cancer (HCC)    Breast cancer, left (HCC)    Chronic kidney disease    stage III - patient was unaware   Diabetes mellitus    Dyspnea    Dysrhythmia    Afib   H/O: hysterectomy    Hardware complicating wound infection 08/28/2022   History of colonoscopy 01/25/2010   History of mammogram 08/04/2009   Hyperlipidemia    Hypertension    Hypothyroidism    Ketoacidosis, diabetic, no coma, non-insulin  dependent    Type II   Paralysis (HCC)    left leg partially paralized   Presence of permanent cardiac pacemaker    Proteus infection 08/28/2022   Pruritus 07/10/2022    Vaccine counseling 12/31/2022   Vitamin B12 deficiency     Past Surgical History:  Procedure Laterality Date   ABDOMINAL HYSTERECTOMY     BACK SURGERY     Bilateral foot surgery     BREAST BIOPSY Right 11/21/2023   US  RT BREAST BX W LOC DEV 1ST LESION IMG BX SPEC US  GUIDE 11/21/2023 [facility]   BREAST BIOPSY  01/11/2024   US  RT RADIOACTIVE SEED LOC 01/11/2024 [facility]   BREAST EXCISIONAL BIOPSY Left 12/2015   BREAST LUMPECTOMY Left    2017   BREAST LUMPECTOMY WITH RADIOACTIVE SEED AND SENTINEL LYMPH NODE BIOPSY Left 01/19/2016   Procedure: LEFT BREAST LUMPECTOMY WITH RADIOACTIVE SEED AND SENTINEL LYMPH NODE BIOPSY;  Surgeon: [name], MD;  Location: [facility] OR;  Service: General;  Laterality: Left;   BREAST LUMPECTOMY WITH RADIOACTIVE SEED LOCALIZATION Left 01/19/2016   BREAST LUMPECTOMY WITH RADIOACTIVE SEED LOCALIZATION Right 01/14/2024   Procedure: BREAST LUMPECTOMY WITH RADIOACTIVE SEED LOCALIZATION;  Surgeon: Null Deward MOULD, MD;  Location: [facility] OR;  Service: General;  Laterality: Right;  RIGHT BREAST RADIOACTIVE SEED LOCALIZED LUMPECTOMY   CARDIOVERSION  02/27/2011   Procedure: CARDIOVERSION;  Surgeon: Ezra Shuck, MD;  Location: [facility] OR;  Service: Cardiovascular;  Laterality: N/A;   INSERT / REPLACE / REMOVE PACEMAKER     LAPAROSCOPIC CHOLECYSTECTOMY     LUMBAR WOUND DEBRIDEMENT N/A 12/20/2020   Procedure: LUMBAR WOUND DEBRIDEMENT WITH PLACEMENTOF LUMBAR WOUND VAC;  Surgeon: Mavis Purchase, MD;  Location: [facility] OR;  Service:  Neurosurgery;  Laterality: N/A;   PACEMAKER IMPLANT N/A 12/02/2018   Procedure: PACEMAKER IMPLANT;  Surgeon: Waddell Danelle ORN, MD;  Location: [facility] INVASIVE [facility];  Service: Cardiovascular;  Laterality: N/A;   ROTATOR CUFF REPAIR Left    TUBAL LIGATION     WOUND EXPLORATION N/A 01/19/2022   Procedure: REVISION OF LUMBAR WOUND;  Surgeon: Mavis Purchase, MD;  Location: [facility] OR;  Service: Neurosurgery;  Laterality: N/A;  3C    No current  facility-administered medications for this visit.   No current outpatient medications on file.   Facility-Administered Medications Ordered in Other Visits  Medication Dose Route Frequency Provider Last Rate Last Admin   acetaminophen  (TYLENOL ) tablet 650 mg  650 mg Oral Q6H PRN Opyd, Timothy S, MD   650 mg at 02/18/24 9262   Or   acetaminophen  (TYLENOL ) suppository 650 mg  650 mg Rectal Q6H PRN Opyd, Timothy S, MD       apixaban  (ELIQUIS ) tablet 2.5 mg  2.5 mg Oral BID Opyd, Timothy S, MD   2.5 mg at 02/21/24 0920   atorvastatin  (LIPITOR) tablet 20 mg  20 mg Oral Daily Opyd, Timothy S, MD   20 mg at 02/21/24 9078   cyclobenzaprine  (FLEXERIL ) tablet 5 mg  5 mg Oral TID PRN Arlon Carliss ORN, DO   5 mg at 02/21/24 9688   insulin  aspart (novoLOG ) injection 0-5 Units  0-5 Units Subcutaneous QHS Opyd, Timothy S, MD   2 Units at 02/20/24 2121   insulin  aspart (novoLOG ) injection 0-6 Units  0-6 Units Subcutaneous TID WC Opyd, Timothy S, MD   1 Units at 02/21/24 1157   insulin  glargine (LANTUS ) injection 5 Units  5 Units Subcutaneous BID Arlon Carliss ORN, DO   5 Units at 02/21/24 9078   irbesartan  (AVAPRO ) tablet 300 mg  300 mg Oral Daily Opyd, Timothy S, MD   300 mg at 02/21/24 0920   levothyroxine  (SYNTHROID ) tablet 125 mcg  125 mcg Oral QAC breakfast Opyd, Timothy S, MD   125 mcg at 02/21/24 0920   metoprolol  succinate (TOPROL -XL) 24 hr tablet 25 mg  25 mg Oral Daily Opyd, Timothy S, MD   25 mg at 02/21/24 0920   oxyCODONE  (Oxy IR/ROXICODONE ) immediate release tablet 5 mg  5 mg Oral Q4H PRN Arlon Carliss ORN, DO   5 mg at 02/18/24 2226   senna (SENOKOT) tablet 8.6 mg  1 tablet Oral Daily PRN Opyd, Timothy S, MD       sodium chloride  flush (NS) 0.9 % injection 3 mL  3 mL Intravenous Q12H Opyd, Timothy S, MD   3 mL at 02/21/24 9078    Allergies:   Bee venom, Penicillins, Lisinopril, Zocor [simvastatin], and Liraglutide   Social History:  The patient  reports that she has never smoked. She has never  been exposed to tobacco smoke. She has never used smokeless tobacco. She reports that she does not drink alcohol  and does not use drugs.   Family History:  The patient's family history includes Bladder Cancer (age of onset: 2) in her nephew; Breast cancer (age of onset: 46) in her niece; Breast cancer (age of onset: 82) in her sister; Breast cancer (age of onset: 54) in her sister; Cancer in her son; Diabetes (age of onset: 1) in her mother; Heart failure (age of onset: 74) in her father; Pneumonia (age of onset: 70) in her mother.  ROS:  Please see the history of present illness.  All other systems are reviewed and  otherwise negative.   PHYSICAL EXAM:  VS:  LMP  (LMP Unknown)  BMI: There is no height or weight on file to calculate BMI. Well nourished, well developed, in no acute distress  HEENT: normocephalic, atraumatic  Neck: no JVD, carotid bruits or masses Cardiac:  *** RRR; (paced)no significant murmurs, no rubs, or gallops Lungs: *** CTA b/l, no wheezing, rhonchi or rales  Abd: soft, nontender MS: no deformity or atrophy Ext: *** no edema  Skin: warm and dry, no rash Neuro:  No gross deficits appreciated Psych: euthymic mood, full affect  *** PPM site: no erythema, edema, tethering  or fluctuation.    EKG:  Not done today 02/18/24: personally reviewed is: V paced, QRS  PPM interrogation done today and reviewed by myself:  *** battery and lead measurements are good ***  08/16/23: TTE IMPRESSIONS 1. Left ventricular ejection fraction, by estimation, is 60 to 65%. The left ventricle has normal function. The left ventricle has no regional wall motion abnormalities. There is mild left ventricular hypertrophy. Left ventricular diastolic parameters were normal. 2. Right ventricular systolic function is mildly reduced. The right ventricular size is mildly enlarged. There is normal pulmonary artery systolic pressure. The estimated right ventricular systolic pressure is 28.2  mmHg. 3. Left atrial size was moderately dilated. 4. The mitral valve is normal in structure. No evidence of mitral valve regurgitation. No evidence of mitral stenosis. 5. The aortic valve is normal in structure. Aortic valve regurgitation is not visualized. No aortic stenosis is present. 6. The inferior vena cava is normal in size with greater than 50% respiratory variability, suggesting right atrial pressure of 3 mmHg.  07/26/17: TTE Study Conclusions - Left ventricle: The cavity size was normal. Wall thickness was   increased in a pattern of mild LVH. Systolic function was normal.   The estimated ejection fraction was in the range of 55% to 60%.   Wall motion was normal; there were no regional wall motion   abnormalities. - Mitral valve: Mildly to moderately calcified annulus. Mildly   thickened leaflets . - Left atrium: The atrium was mildly dilated. - Pulmonary arteries: Systolic pressure was mildly increased. PA   peak pressure: 36 mm Hg (S).  Recent Labs: 02/17/2024: ALT 14 02/21/2024: BUN 30; Creatinine, Ser 1.72; Hemoglobin 14.4; Platelets 339; Potassium 4.2; Sodium 133; TSH 45.240  08/16/2023: Cholesterol 148; HDL 51; LDL Cholesterol 79; Total CHOL/HDL Ratio 2.9; Triglycerides 90; VLDL 18   Estimated Creatinine Clearance: 25.1 mL/min (A) (by C-G formula based on SCr of 1.72 mg/dL (H)).   Wt Readings from Last 3 Encounters:  02/11/24 199 lb 9.6 oz (90.5 kg)  01/28/24 199 lb (90.3 kg)  01/14/24 199 lb (90.3 kg)     Other studies reviewed: Additional studies/records reviewed today include: summarized above  ASSESSMENT AND PLAN:  1. PPM     *** intact function     *** No programming changes made  2. Permanent AFib     CHA2DS2Vasc is 4, on Eliquis , *** appropriately dosed     *** rates  3. HTN     *** No changes today  4. Chronic CHF (diastolic)        *** No symptoms or exam findings to suggest fluid OL        Disposition: ***  Current medicines are reviewed at  length with the patient today.  The patient did not have any concerns regarding medicines.  Bonney Charlies Arthur, PA-C 02/21/2024 1:07 PM  [facility] [address] [place] [address] [phone] (office)  [phone] (fax)

## 2024-02-21 NOTE — Progress Notes (Addendum)
 Critical lab called at [phone] for a PO2 level of less than 31. A Chavez paged to make aware  PA Donati-Garmon called, and messaged as well.

## 2024-02-21 NOTE — Progress Notes (Addendum)
 Pt is appears more confused than at the start of shift A/O times 4. Now A/O to self and place. BP is currently 142/110. Pt is also having visual and auditory hallucinations. Only med given is flexeril . On call provider A. Chavez notified of findings.  Vitals retaken and updated 165/96, CBG 176 PT offered PO intake, reoriented, and education about possible increased confusion with flexeril  and Pt stated she would try something else to make her sleep and relax PT educated continued. Pt Resp even and unlabored denies any chest pain and is currently without acute distress.

## 2024-02-21 NOTE — Progress Notes (Addendum)
° °      Overnight   NAME: Summer Hawkins MRN: [id number] DOB : June 14, 1934    Date of Service   02/21/2024   HPI/Events of Note   HPI: 89 year old with medical history significant for hypertension type 2 diabetes mellitus A-fib on Eliquis  chronic kidney disease stage IV, bradycardia with pacemaker chronic left leg weakness and breast cancer presented with headache and pain in her back shoulders hips after fall.    Events of note: RN notified of mental status change patient is alert and oriented x 2.  Patient reports  hallucinations. VSS.  RN declines any one-sided weakness or strokelike symptoms. RN unaware of ETOH status of patient.  Concerned for delirium vs acute infection vs vitamin deficiency. ordered Vit b12, TSH, VBG, Folate and UA.    Interventions/ Plan   Supportive environment for delirium precautions UA to rule out infectious cause  TSH, b12, VBG, and folate to rule out lab abnormalities.       Laneta Piles reports ati- Clinical Cytogeneticist Acute Care Nurse Practitioner [facility]

## 2024-02-21 NOTE — Progress Notes (Addendum)
 PROGRESS NOTE    Summer Hawkins  FMW:[id number] DOB: 22-Aug-1934 DOA: 02/17/2024 PCP: Vernadine Charlie ORN, MD   Brief Narrative:  This 89 yrs old female with medical history significant for hypertension, type 2 diabetes mellitus, atrial fibrillation on Eliquis , CKD stage IV, symptomatic bradycardia with pacemaker, chronic left leg weakness, and breast cancer who presents with headache and pain in her back, shoulders, and hips after a fall.  No acute findings noted on trauma imaging.  Patient admitted for further evaluation.  Assessment & Plan:   Principal Problem:   Syncope due to orthostatic hypotension Active Problems:   Essential hypertension   Permanent atrial fibrillation (HCC)   Type 2 diabetes mellitus without complication, with long-term current use of insulin  (HCC)   Hypothyroidism   CKD (chronic kidney disease), stage IV (HCC)   Malignant neoplasm of upper-inner quadrant of right breast in female, estrogen receptor positive (HCC)   Intractable pain   Carotid stenosis, right   Acute back pain   Possible orthostatic hypotension and syncope: Patient reports that she had passed out at home. Had an episode of orthostatic syncope yesterday morning,  getting up from the bed with OT.  Continue IV fluids.  Will discontinue amlodipine .  Monitor closely.   Multiple falls with intractable pain: No acute findings on trauma imaging.  Continue Pain control and cyclobenzaprine  added.  Showing marked improvement.  Evaluated by PT/OT.  Initially recommending home health however repeat evaluation showing needing max assist with some tasks.  Patient lives alone at home.  Likely pursue SNF.   Atrial fibrillation: Continue Eliquis  and metoprolol .  Heart rate is controlled.   Diabetes mellitus: - Insulin  sliding scale. Continue Lantus  5 units daily.   Hypertension: Amlodipine  discontinued.  Continue ARB.   Right carotid stenosis: Carotid ultrasound suggestive of right high-grade stenosis.   Already on Eliquis .   May benefit from outpatient consultation for endarterectomy. D/w Dr. Magda, She has stenosis from 09/05/23, seen on CT at that time No role for intervention . Just recommend ASA / Statin for secondary prevention.    CKD 4: Creatinine appears around baseline.  Will monitor urine output recheck BMP in AM.   Breast cancer: Followed by Dr. Lanny.  Currently on anastrozole .  Scheduled to start radiation this week per patient.  Hypothyroidism: Continue levothyroxine .  DVT prophylaxis: Eliquis  Code Status: DNR Family Communication: No family at bedside Disposition Plan:    Status is: Inpatient Remains inpatient appropriate because: Not medically ready,  needs PT OT evaluation.   Consultants:  None  Procedures:.  Antimicrobials:  Anti-infectives (From admission, onward)    None      Subjective: Patient was seen and examined at bedside. Overnight events noted. Patient reports feeling better,  She reports having pain otherwise mental status seems improved.  Objective: Vitals:   02/21/24 0351 02/21/24 0536 02/21/24 0632 02/21/24 0824  BP: (!) 142/110 (!) 165/96  (!) 140/72  Pulse: 70 73  79  Resp: 15 18  16   Temp: 98 F (36.7 C) 97.8 F (36.6 C) 98.6 F (37 C) 98.4 F (36.9 C)  TempSrc: Oral Oral  Oral  SpO2: 98% 99%  100%  Height:        Intake/Output Summary (Last 24 hours) at 02/21/2024 1326 Last data filed at 02/21/2024 0305 Gross per 24 hour  Intake --  Output 2000 ml  Net -2000 ml   There were no vitals filed for this visit.  Examination:  General exam: Appears calm and comfortable, not in  any acute distress, deconditioned Respiratory system: Clear to auscultation. Respiratory effort normal.  RR 14 Cardiovascular system: S1 & S2 heard, RRR. No JVD, murmurs, rubs, gallops or clicks.  Gastrointestinal system: Abdomen is non distended, soft and non tender. Normal bowel sounds heard. Central nervous system: Alert and oriented X 2. No  focal neurological deficits. Extremities: No edema, no cyanosis, no clubbing. Skin: No rashes, lesions or ulcers Psychiatry: Judgement and insight appear normal. Mood & affect appropriate.     Data Reviewed: I have personally reviewed following labs and imaging studies  CBC: Recent Labs  Lab 02/17/24 1817 02/17/24 1820 02/18/24 0531 02/19/24 0549 02/20/24 0751 02/21/24 0456  WBC 10.6*  --  11.1* 10.1 11.4* 12.2*  HGB 13.4 15.3* 12.7 13.3 13.8 14.4  HCT 42.4 45.0 39.5 41.0 42.7 43.6  MCV 93.6  --  92.1 92.3 92.0 89.5  PLT 349  --  315 314 327 339   Basic Metabolic Panel: Recent Labs  Lab 02/17/24 1817 02/17/24 1820 02/18/24 0531 02/19/24 0549 02/20/24 0751 02/21/24 0456  NA 134* 135 138 135 134* 133*  K 4.4 4.4 4.3 4.4 4.9 4.2  CL 99 101 106 102 98 98  CO2 22  --  21* 17* 26 27  GLUCOSE 310* 314* 154* 85 210* 177*  BUN 39* 41* 33* 31* 29* 30*  CREATININE 2.09* 2.20* 1.72* 1.86* 1.75* 1.72*  CALCIUM  9.0  --  8.8* 8.7* 9.2 9.1   GFR: Estimated Creatinine Clearance: 25.1 mL/min (A) (by C-G formula based on SCr of 1.72 mg/dL (H)). Liver Function Tests: Recent Labs  Lab 02/17/24 1817  AST 19  ALT 14  ALKPHOS 115  BILITOT 0.8  PROT 7.9  ALBUMIN 3.8   No results for input(s): LIPASE, AMYLASE in the last 168 hours. No results for input(s): AMMONIA in the last 168 hours. Coagulation Profile: Recent Labs  Lab 02/17/24 1817  INR 1.1   Cardiac Enzymes: No results for input(s): CKTOTAL, CKMB, CKMBINDEX, TROPONINI in the last 168 hours. BNP (last 3 results) No results for input(s): PROBNP in the last 8760 hours. HbA1C: No results for input(s): HGBA1C in the last 72 hours. CBG: Recent Labs  Lab 02/20/24 1630 02/20/24 2053 02/21/24 0544 02/21/24 0903 02/21/24 1138  GLUCAP 223* 249* 176* 186* 189*   Lipid Profile: No results for input(s): CHOL, HDL, LDLCALC, TRIG, CHOLHDL, LDLDIRECT in the last 72 hours. Thyroid  Function  Tests: Recent Labs    02/21/24 0634  TSH 45.240*   Anemia Panel: Recent Labs    02/21/24 0634  VITAMINB12 1,448*  FOLATE >20.0   Sepsis Labs: Recent Labs  Lab 02/17/24 1821 02/18/24 0531  LATICACIDVEN 3.2* 1.3    No results found for this or any previous visit (from the past 240 hours).   Radiology Studies: VAS US  CAROTID Result Date: 02/20/2024 Carotid Arterial Duplex Study Patient Name:  Summer Hawkins  Date of Exam:   02/20/2024 Medical Rec #: [id number]       Accession #:    [id number] Date of Birth: 1934/12/05       Patient Gender: F Patient Age:   59 years Exam Location:  [facility] Procedure:      VAS US  CAROTID Referring Phys: CARLISS CANALES --------------------------------------------------------------------------------  Indications:       Syncope. Risk Factors:      Hypertension, Diabetes. Comparison Study:  No prior studies. Performing Technologist: Cordella Collet RVT  Examination Guidelines: A complete evaluation includes B-mode imaging, spectral Doppler, color Doppler, and  power Doppler as needed of all accessible portions of each vessel. Bilateral testing is considered an integral part of a complete examination. Limited examinations for reoccurring indications may be performed as noted.  Right Carotid Findings: +----------+-------+--------+--------+---------------------+-------------------+           PSV    EDV cm/sStenosisPlaque Description   Comments                      cm/s                                                            +----------+-------+--------+--------+---------------------+-------------------+ CCA Prox  45     5                                                        +----------+-------+--------+--------+---------------------+-------------------+ CCA Distal31     4               smooth and                                                                heterogenous                              +----------+-------+--------+--------+---------------------+-------------------+ ICA Prox  15     0                                    High resistant flow +----------+-------+--------+--------+---------------------+-------------------+ ICA Mid   11                                          High resistant flow +----------+-------+--------+--------+---------------------+-------------------+ ICA Distal12     0                                    Highl resistant                                                           flow                +----------+-------+--------+--------+---------------------+-------------------+ ECA       55     8                                                        +----------+-------+--------+--------+---------------------+-------------------+ +----------+--------+-------+--------+-------------------+  PSV cm/sEDV cmsDescribeArm Pressure (mmHG) +----------+--------+-------+--------+-------------------+ [id number]                                         +----------+--------+-------+--------+-------------------+ +---------+--------+--+--------+-+---------+ VertebralPSV cm/s30EDV cm/s5Antegrade +---------+--------+--+--------+-+---------+  Left Carotid Findings: +----------+--------+--------+--------+-----------------------+--------+           PSV cm/sEDV cm/sStenosisPlaque Description     Comments +----------+--------+--------+--------+-----------------------+--------+ CCA Prox  50      10                                              +----------+--------+--------+--------+-----------------------+--------+ CCA Distal49      12              smooth and heterogenous         +----------+--------+--------+--------+-----------------------+--------+ ICA Prox  42      17              smooth and heterogenous         +----------+--------+--------+--------+-----------------------+--------+ ICA Mid   58      21                                      tortuous +----------+--------+--------+--------+-----------------------+--------+ ICA Distal52      13                                     tortuous +----------+--------+--------+--------+-----------------------+--------+ ECA       58      6                                               +----------+--------+--------+--------+-----------------------+--------+ +----------+--------+--------+--------+-------------------+           PSV cm/sEDV cm/sDescribeArm Pressure (mmHG) +----------+--------+--------+--------+-------------------+ Subclavian89                                          +----------+--------+--------+--------+-------------------+ +---------+--------+--+--------+-+---------+ VertebralPSV cm/s31EDV cm/s9Antegrade +---------+--------+--+--------+-+---------+   Summary: Right Carotid: High resistant flow is noted in the proximal, mid, and distal                aspects of the ICA. High resistant flow is suggestive of a distal                high grade stenosis/occlusion. Left Carotid: Velocities in the left ICA are consistent with a 1-39% stenosis. Vertebrals: Bilateral vertebral arteries demonstrate antegrade flow. *See table(s) above for measurements and observations.  Electronically signed by Debby Robertson on 02/20/2024 at 10:12:49 PM.    Final    Scheduled Meds:  apixaban   2.5 mg Oral BID   atorvastatin   20 mg Oral Daily   insulin  aspart  0-5 Units Subcutaneous QHS   insulin  aspart  0-6 Units Subcutaneous TID WC   insulin  glargine  5 Units Subcutaneous BID   irbesartan   300 mg Oral Daily   levothyroxine   125 mcg Oral QAC breakfast   metoprolol  succinate  25 mg Oral  Daily   sodium chloride  flush  3 mL Intravenous Q12H   Continuous Infusions:   LOS: 3 days    Time spent: 50 mins    Darcel Dawley, MD [facility]   If 7PM-7AM, please contact night-coverage

## 2024-02-21 NOTE — Progress Notes (Addendum)
 Physical Therapy Treatment Patient Details Name: Summer Hawkins MRN: [id number] DOB: 12-08-1934 Today's Date: 02/21/2024   History of Present Illness 89 yo F adm 12/7 s/p fall with pain back shoulders and hip Pt with workup for mild TBI. Pt currently with breast CA treatment.  PMH: breast cancer, CKD III, DMII, HTN, pacemaker, RC repair. Atrial fib on eliquis , back surgery with residual LLE weakness    PT Comments  Pt received in supine, agreeable to work with SPTA. Pt alert but not consistently oriented, often thinking she is at her own home during session, able to be reoriented in the moment. Pt requiring max A+2 for bed mobility, and up to mod A+2 for transfers. Pt CGA for gait with RW, limited distance with gait due to delirium and inability to follow all commands, chair follow provided for pt safety. Pt c/o 5/10 resting pain and 8/10 pain with movement, stating it hurt everywhere she hit in the accident. Pt agreeable to sit up in chair to finish her lunch, chair alarm set and call bell within reach, RN/NT notified PTA placed briefs on her while pt mobilizing and up in chair, will need briefs and skin checked later for moisture when she gets back to bed. Patient will benefit from continued inpatient follow up therapy, <3 hours/day.     If plan is discharge home, recommend the following: A lot of help with bathing/dressing/bathroom;Assistance with cooking/housework;Assist for transportation;Help with stairs or ramp for entrance;Supervision due to cognitive status;Direct supervision/assist for medications management;Direct supervision/assist for financial management;A lot of help with walking and/or transfers   Can travel by private vehicle        Equipment Recommendations  None recommended by PT (TBD)    Recommendations for Other Services       Precautions / Restrictions Precautions Precautions: Fall Recall of Precautions/Restrictions: Intact Precaution/Restrictions Comments: recent hx  of vasovagal or syncopal type episodes, correlating with back pain/spasms while moving. Pt does better looking to left due to R ICA occlusion. Restrictions Weight Bearing Restrictions Per Provider Order: No     Mobility  Bed Mobility Overal bed mobility: Needs Assistance Bed Mobility: Rolling, Sidelying to Sit Rolling: Max assist, +2 for physical assistance Sidelying to sit: Max assist, +2 for physical assistance, Used rails, HOB elevated       General bed mobility comments: Pt requring more assistance today with max A for bed mobility, requiring more verbal cues and LE assist.    Transfers Overall transfer level: Needs assistance Equipment used: Rolling walker (2 wheels) Transfers: Sit to/from Stand, Bed to chair/wheelchair/BSC Sit to Stand: Mod assist, +2 safety/equipment         General transfer comment: From EOB to RW, chair to RW, and then RW to recliner. Pt required mod A to stand from EOB today and mod +2 for safety when standing from chair without arm rest.    Ambulation/Gait Ambulation/Gait assistance: Contact guard assist, +2 safety/equipment (chair follow) Gait Distance (Feet): 25 Feet x2 with seated break between trials Assistive device: Rolling walker (2 wheels) Gait Pattern/deviations: Step-to pattern, Decreased step length - left, Drifts right/left Gait velocity: decr     General Gait Details: Assist for safety due to delirium, pt requiring repeated verbal cues for directions, limited distance due to delirium and lack of ability to follow all commands today.   Stairs             Wheelchair Mobility     Tilt Bed    Modified Rankin (Stroke Patients Only)  Balance Overall balance assessment: Needs assistance Sitting-balance support: Feet supported, Bilateral upper extremity supported Sitting balance-Leahy Scale: Fair Sitting balance - Comments: EOB   Standing balance support: Bilateral upper extremity supported, During functional  activity Standing balance-Leahy Scale: Poor Standing balance comment: Reliant on BUE support from RW, pt hesitant to let go of walker to sit in chair even with +2 assist and support.                            Communication Communication Communication: Impaired Factors Affecting Communication: Hearing impaired  Cognition Arousal: Alert Behavior During Therapy: WFL for tasks assessed/performed   PT - Cognitive impairments: Safety/Judgement, Problem solving, Sequencing, Orientation, Awareness, Memory, Initiation   Orientation impairments: Place, Time                   PT - Cognition Comments: Pt presenting with delerium,  recalls her fall from rollator and able to list all holidays in December including [place]  bombing. Pt then stating When I was downstairs this morning there were so many people around and they were all mute, then my nurse took blood right there in front of everyone. Pt stated she had been having bad dreams and hallucinations recently, so possible she was recalling a hallucination she had. Pt kept mistaking being in her home, offering pears that were on the counter and telling us  where her briefs were in the house and to go get them. Pt unaware of the time of day mistaking her lunch for dinner, I thought it was about 5pm. Following commands: Impaired Following commands impaired: Follows one step commands with increased time, Follows one step commands inconsistently    Cueing Cueing Techniques: Verbal cues, Gestural cues, Tactile cues  Exercises      General Comments General comments (skin integrity, edema, etc.): Pt requring more assist with bed mobility and trasnfers today. Pt presenting with increased delirium and inability to follow commands.      Pertinent Vitals/Pain Pain Assessment Pain Assessment: 0-10 Pain Score: 8  Pain Location: Back and everywhere I hit in the accident Pain Descriptors / Indicators: Sore, Spasm, Grimacing,  Guarding, Cramping, Moaning Pain Intervention(s): Limited activity within patient's tolerance, Monitored during session, Repositioned    Home Living                          Prior Function            PT Goals (current goals can now be found in the care plan section) Acute Rehab PT Goals Patient Stated Goal: Return home when stronger, be able to get around my house on my own PT Goal Formulation: With patient Time For Goal Achievement: 02/25/24 Progress towards PT goals: Progressing toward goals    Frequency    Min 3X/week      PT Plan      Co-evaluation              AM-PAC PT 6 Clicks Mobility   Outcome Measure   Help needed turning from your back to your side while in a flat bed without using bedrails?: Total Help needed moving from lying on your back to sitting on the side of a flat bed without using bedrails?: Total Help needed moving to and from a bed to a chair (including a wheelchair)?: A Lot Help needed standing up from a chair using your arms (e.g., wheelchair or bedside chair)?: A Lot  Help needed to walk in hospital room?: A Lot Help needed climbing 3-5 steps with a railing? : Total  6 Click Score: 9  Consider Recommendation of Discharge To: SNF    End of Session Equipment Utilized During Treatment: Gait belt Activity Tolerance: Patient tolerated treatment well;Other (comment) (Pt required more assist today but was able to participate, limited due to delirium.) Patient left: in chair;with call bell/phone within reach;with chair alarm set;Other (comment) Nurse Communication: Mobility status;Other (comment) (Pt more delirius today) PT Visit Diagnosis: Unsteadiness on feet (R26.81);Other abnormalities of gait and mobility (R26.89);Muscle weakness (generalized) (M62.81);History of falling (Z91.81);Pain     Time: 8794-8764 PT Time Calculation (min) (ACUTE ONLY): 30 min  Charges:    $Gait Training: 8-22 mins $Therapeutic Activity: 8-22  mins PT General Charges $$ ACUTE PT VISIT: 1 Visit                      Johnnie Gaynelle RADDLE    [facility] Eulanda Dorion 02/21/2024, 2:47 PM

## 2024-02-22 ENCOUNTER — Telehealth: Payer: Self-pay | Admitting: *Deleted

## 2024-02-22 ENCOUNTER — Other Ambulatory Visit (HOSPITAL_COMMUNITY): Payer: Self-pay

## 2024-02-22 ENCOUNTER — Ambulatory Visit: Attending: Physician Assistant | Admitting: Physician Assistant

## 2024-02-22 LAB — BASIC METABOLIC PANEL WITH GFR
Anion gap: 12 (ref 5–15)
BUN: 33 mg/dL — ABNORMAL HIGH (ref 8–23)
CO2: 23 mmol/L (ref 22–32)
Calcium: 8.8 mg/dL — ABNORMAL LOW (ref 8.9–10.3)
Chloride: 99 mmol/L (ref 98–111)
Creatinine, Ser: 1.89 mg/dL — ABNORMAL HIGH (ref 0.44–1.00)
GFR, Estimated: 25 mL/min — ABNORMAL LOW (ref >60)
Glucose, Bld: 159 mg/dL — ABNORMAL HIGH (ref 70–99)
Potassium: 4.9 mmol/L (ref 3.5–5.1)
Sodium: 134 mmol/L — ABNORMAL LOW (ref 135–145)

## 2024-02-22 LAB — CBC
HCT: 42.2 % (ref 36.0–46.0)
Hemoglobin: 14 g/dL (ref 12.0–15.0)
MCH: 30.1 pg (ref 26.0–34.0)
MCHC: 33.2 g/dL (ref 30.0–36.0)
MCV: 90.8 fL (ref 80.0–100.0)
Platelets: 324 K/uL (ref 150–400)
RBC: 4.65 MIL/uL (ref 3.87–5.11)
RDW: 14.1 % (ref 11.5–15.5)
WBC: 12.2 K/uL — ABNORMAL HIGH (ref 4.0–10.5)
nRBC: 0 % (ref 0.0–0.2)

## 2024-02-22 LAB — GLUCOSE, CAPILLARY
Glucose-Capillary: 153 mg/dL — ABNORMAL HIGH (ref 70–99)
Glucose-Capillary: 165 mg/dL — ABNORMAL HIGH (ref 70–99)

## 2024-02-22 LAB — MAGNESIUM: Magnesium: 2.1 mg/dL (ref 1.7–2.4)

## 2024-02-22 LAB — PHOSPHORUS: Phosphorus: 3.8 mg/dL (ref 2.5–4.6)

## 2024-02-22 LAB — T4, FREE: Free T4: 1.01 ng/dL (ref 0.61–1.12)

## 2024-02-22 LAB — TSH: TSH: 57.377 u[IU]/mL — ABNORMAL HIGH (ref 0.350–4.500)

## 2024-02-22 MED ORDER — CYCLOBENZAPRINE HCL 5 MG PO TABS: 5.0000 mg | ORAL_TABLET | Freq: Three times a day (TID) | ORAL | 0 refills | Status: AC | PRN

## 2024-02-22 MED ORDER — LEVOTHYROXINE SODIUM 137 MCG PO TABS: 125.0000 ug | ORAL_TABLET | Freq: Every evening | ORAL | 0 refills | Status: AC

## 2024-02-22 MED ORDER — OXYCODONE HCL 5 MG PO TABS: 5.0000 mg | ORAL_TABLET | ORAL | 0 refills | Status: DC | PRN

## 2024-02-22 NOTE — TOC Transition Note (Signed)
 Transition of Care [facility]) - Discharge Note   Patient Details  Name: Summer Hawkins MRN: [id number] Date of Birth: 20-Aug-1934  [facility] CM/SW Contact:  Bridget Cordella Simmonds, LCSW Phone Number: 02/22/2024, 2:16 PM   Clinical Narrative:   Pt discharging to [place], room 106p. RN report to [phone]   PTAR called [phone].  Final next level of care: Skilled Nursing Facility Barriers to Discharge: Barriers Resolved   Patient Goals and CMS Choice     Choice offered to / list presented to : Patient      Discharge Placement              Patient chooses bed at: [facility] Patient to be transferred to facility by: ptar Name of family member notified: granddaughter jennifer Patient and family notified of of transfer: 02/22/24  Discharge Plan and Services Additional resources added to the After Visit Summary for                            [facility] Arranged: PT, OT, RN [facility] Agency: [facility] Date [facility] Agency Contacted: 02/19/24 Time HH Agency Contacted: [phone] Representative spoke with at [facility] Agency: Arna  Social Drivers of Health (SDOH) Interventions SDOH Screenings   Food Insecurity: Unknown (02/19/2024)  Housing: Unknown (02/19/2024)  Transportation Needs: No Transportation Needs (02/19/2024)  Utilities: Not At Risk (02/19/2024)  Depression (PHQ2-9): Low Risk (01/28/2024)  Social Connections: Moderately Isolated (02/19/2024)  Tobacco Use: Low Risk (02/18/2024)     Readmission Risk Interventions     No data to display

## 2024-02-22 NOTE — Discharge Summary (Addendum)
 Physician Discharge Summary  Summer Hawkins FMW:[id number] DOB: 08/13/1934 DOA: 02/17/2024  PCP: Tisovec, Richard W, MD  Admit date: 02/17/2024  Discharge date: 02/22/2024  Admitted From: Home  Disposition:  SNF  Recommendations for Outpatient Follow-up:  Follow up with PCP in 1-2 weeks. Please obtain BMP/CBC in one week. Advised to increase levothyroxine  to 137 mcg daily. Request PCP to recheck TSH, FT3-T4 in 4 to 6 weeks and adjust medicine accordingly. Advised to discontinue amlodipine .  Home Health:None Equipment/Devices:None  Discharge Condition: Stable CODE STATUS:DNR Diet recommendation: Heart Healthy   Brief [facility] Course: This 89 yrs old female with medical history significant for hypertension, type 2 diabetes mellitus, atrial fibrillation on Eliquis , CKD stage IV, symptomatic bradycardia with pacemaker, chronic left leg weakness, and breast cancer who presents with headache and pain in her back, shoulders, and hips after a fall.  No acute findings noted on trauma imaging.  Patient admitted for further evaluation.  Patient was continued on pain control.  She has made significant improvement.  PT and OT recommended skilled nursing facility for rehab.  Patient was continued on Eliquis  and metoprolol .  Amlodipine  was discontinued due to syncope.  Patient found to have high-grade stenosis on carotid duplex, discussed with Dr. Vonzell, he has reviewed at the prior carotid duplex, states patient has carotid stenosis from 6 /25.  There is no role for intervention,  just recommended aspirin and statin for secondary prevention.  Since patient is already on Eliquis ,  we will continue with Eliquis  and statin.  Patient's TSH was found to be elevated.  Levothyroxine  dose increased to 137 mcg daily,  advised PCP to readjust based on follow-up labs. Insurance authorization approved.  Patient being discharged to SNF.   Discharge Diagnoses:  Principal Problem:   Syncope due to  orthostatic hypotension Active Problems:   Essential hypertension   Permanent atrial fibrillation (HCC)   Type 2 diabetes mellitus without complication, with long-term current use of insulin  (HCC)   Hypothyroidism   CKD (chronic kidney disease), stage IV (HCC)   Malignant neoplasm of upper-inner quadrant of right breast in female, estrogen receptor positive (HCC)   Intractable pain   Carotid stenosis, right   Acute back pain  Possible orthostatic hypotension and syncope: Patient reports that she had passed out at home. Had an episode of orthostatic syncope yesterday morning,  getting up from the bed with OT.  Continue IV fluids.  Will discontinue amlodipine .  Monitor closely.   Multiple falls with intractable pain: No acute findings on trauma imaging.  Continue Pain control and cyclobenzaprine  added.  Showing marked improvement.  Evaluated by PT/OT.  Initially recommending home health however repeat evaluation showing needing max assist with some tasks.  Patient lives alone at home.  Patient approved for SNF placement.   Atrial fibrillation: Continue Eliquis  and metoprolol .  Heart rate is controlled.   Diabetes mellitus: - Insulin  sliding scale. Continue Lantus  5 units daily.   Hypertension: Amlodipine  discontinued.  Continue ARB.   Right carotid stenosis: Carotid ultrasound suggestive of right high-grade stenosis.  Already on Eliquis .   May benefit from outpatient consultation for endarterectomy. D/w Dr. Magda, She has stenosis from 09/05/23, seen on CT at that time No role for intervention . Just recommend ASA / Statin for secondary prevention.    CKD 4: Creatinine appears around baseline.     Breast cancer: Followed by Dr. Lanny.  Currently on anastrozole .  Scheduled to start radiation this week per patient.   Hypothyroidism: TSH found to  be elevated. Levothyroxine  increased to 137 mcg daily. Request PCP to readjust based on levels.  Discharge Instructions  Discharge  Instructions     Call MD for:  difficulty breathing, headache or visual disturbances   Complete by: As directed    Call MD for:  persistant dizziness or light-headedness   Complete by: As directed    Call MD for:  persistant nausea and vomiting   Complete by: As directed    Diet - low sodium heart healthy   Complete by: As directed    Discharge instructions   Complete by: As directed    Advised to follow-up with primary care physician in 1 week. Advised to increase levothyroxine  to 137 mcg daily. Request PCP to recheck TSH T3-T4 in 4 to 6 weeks and adjust medicine accordingly. Advised to discontinue amlodipine .   Discharge wound care:   Complete by: As directed    Wound care and nursing home.   Increase activity slowly   Complete by: As directed       Allergies as of 02/22/2024       Reactions   Bee Venom Shortness Of Breath, Nausea And Vomiting, Other (See Comments)   Makes the patient feel faint, also   Penicillins Anaphylaxis, Hives, Swelling   Lisinopril Cough   Zocor [simvastatin] Other (See Comments)   memory changes   Liraglutide Rash, Other (See Comments)   Rash at injection site        Medication List     STOP taking these medications    amLODipine  5 MG tablet Commonly known as: NORVASC    anastrozole  1 MG tablet Commonly known as: ARIMIDEX        TAKE these medications    apixaban  2.5 MG Tabs tablet Commonly known as: ELIQUIS  Take 1 tablet (2.5 mg total) by mouth 2 (two) times daily.   atorvastatin  20 MG tablet Commonly known as: LIPITOR Take 1 tablet (20 mg total) by mouth daily.   B-12 PO Take 1 tablet by mouth daily.   cyclobenzaprine  5 MG tablet Commonly known as: FLEXERIL  Take 1 tablet (5 mg total) by mouth 3 (three) times daily as needed for muscle spasms.   ferrous sulfate  325 (65 FE) MG tablet Take 325 mg by mouth daily with breakfast.   irbesartan  300 MG tablet Commonly known as: AVAPRO  Take 1 tablet (300 mg total) by mouth  daily.   levothyroxine  137 MCG tablet Commonly known as: SYNTHROID  Take 1 tablet (137 mcg total) by mouth every evening. What changed:  medication strength how much to take   metFORMIN  850 MG tablet Commonly known as: GLUCOPHAGE  Take 850 mg by mouth daily.   metoprolol  succinate 25 MG 24 hr tablet Commonly known as: TOPROL -XL Take 1 tablet (25 mg total) by mouth daily.   multivitamin with minerals tablet Take 1 tablet by mouth in the morning.   timolol  0.5 % ophthalmic solution Commonly known as: TIMOPTIC  Place 1 drop into both eyes every morning.   Toujeo  SoloStar 300 UNIT/ML Solostar Pen Generic drug: insulin  glargine (1 Unit Dial ) Inject 10-12 Units into the skin See admin instructions. Inject 12 units into the skin in the morning and then inject 10 units at night               Discharge Care Instructions  (From admission, onward)           Start     Ordered   02/22/24 0000  Discharge wound care:       Comments:  Wound care and nursing home.   02/22/24 1236            Contact information for follow-up providers     Tisovec, Charlie ORN, MD Follow up.   Specialty: Internal Medicine Contact information: [address] [place] [address] [phone]              Contact information for after-discharge care     Destination     [facility] [place] .   Service: Skilled Nursing Contact information: [address] [place]  [phone]             Home Medical Care     [facility]) .   Service: Home Health Services Contact information: [phone] [place]  [address] [phone]                    Allergies[1]  Consultations: None   Procedures/Studies: VAS US  CAROTID Result Date: 02/20/2024 Carotid Arterial Duplex Study Patient Name:  Summer Hawkins  Date of Exam:   02/20/2024 Medical Rec #: [id number]       Accession #:     [id number] Date of Birth: 02/05/1935       Patient Gender: F Patient Age:   65 years Exam Location:  [facility] Procedure:      VAS US  CAROTID Referring Phys: CARLISS CANALES --------------------------------------------------------------------------------  Indications:       Syncope. Risk Factors:      Hypertension, Diabetes. Comparison Study:  No prior studies. Performing Technologist: Cordella Collet RVT  Examination Guidelines: A complete evaluation includes B-mode imaging, spectral Doppler, color Doppler, and power Doppler as needed of all accessible portions of each vessel. Bilateral testing is considered an integral part of a complete examination. Limited examinations for reoccurring indications may be performed as noted.  Right Carotid Findings: +----------+-------+--------+--------+---------------------+-------------------+           PSV    EDV cm/sStenosisPlaque Description   Comments                      cm/s                                                            +----------+-------+--------+--------+---------------------+-------------------+ CCA Prox  45     5                                                        +----------+-------+--------+--------+---------------------+-------------------+ CCA Distal31     4               smooth and                                                                heterogenous                             +----------+-------+--------+--------+---------------------+-------------------+  ICA Prox  15     0                                    High resistant flow +----------+-------+--------+--------+---------------------+-------------------+ ICA Mid   11                                          High resistant flow +----------+-------+--------+--------+---------------------+-------------------+ ICA Distal12     0                                    Highl resistant                                                            flow                +----------+-------+--------+--------+---------------------+-------------------+ ECA       55     8                                                        +----------+-------+--------+--------+---------------------+-------------------+ +----------+--------+-------+--------+-------------------+           PSV cm/sEDV cmsDescribeArm Pressure (mmHG) +----------+--------+-------+--------+-------------------+ [id number]                                         +----------+--------+-------+--------+-------------------+ +---------+--------+--+--------+-+---------+ VertebralPSV cm/s30EDV cm/s5Antegrade +---------+--------+--+--------+-+---------+  Left Carotid Findings: +----------+--------+--------+--------+-----------------------+--------+           PSV cm/sEDV cm/sStenosisPlaque Description     Comments +----------+--------+--------+--------+-----------------------+--------+ CCA Prox  50      10                                              +----------+--------+--------+--------+-----------------------+--------+ CCA Distal49      12              smooth and heterogenous         +----------+--------+--------+--------+-----------------------+--------+ ICA Prox  42      17              smooth and heterogenous         +----------+--------+--------+--------+-----------------------+--------+ ICA Mid   58      21                                     tortuous +----------+--------+--------+--------+-----------------------+--------+ ICA Distal52      13                                     tortuous +----------+--------+--------+--------+-----------------------+--------+ ECA  58      6                                               +----------+--------+--------+--------+-----------------------+--------+ +----------+--------+--------+--------+-------------------+           PSV cm/sEDV cm/sDescribeArm Pressure (mmHG)  +----------+--------+--------+--------+-------------------+ Subclavian89                                          +----------+--------+--------+--------+-------------------+ +---------+--------+--+--------+-+---------+ VertebralPSV cm/s31EDV cm/s9Antegrade +---------+--------+--+--------+-+---------+   Summary: Right Carotid: High resistant flow is noted in the proximal, mid, and distal                aspects of the ICA. High resistant flow is suggestive of a distal                high grade stenosis/occlusion. Left Carotid: Velocities in the left ICA are consistent with a 1-39% stenosis. Vertebrals: Bilateral vertebral arteries demonstrate antegrade flow. *See table(s) above for measurements and observations.  Electronically signed by Debby Robertson on 02/20/2024 at 10:12:49 PM.    Final    CT T-SPINE NO CHARGE Result Date: 02/17/2024 EXAM: CT THORACIC SPINE WITHOUT CONTRAST 02/17/2024 09:16:19 PM TECHNIQUE: CT of the thoracic spine was performed without the administration of intravenous contrast. Multiplanar reformatted images are provided for review. Automated exposure control, iterative reconstruction, and/or weight based adjustment of the mA/kV was utilized to reduce the radiation dose to as low as reasonably achievable. COMPARISON: None available. CLINICAL HISTORY: FINDINGS: BONES AND ALIGNMENT: Osseous structures are diffusely osteopenic. Mild thoracic dextrocurvature. Normal thoracic kyphosis. No listhesis. Normal vertebral body heights. No acute fracture of the thoracic spine. No suspicious bone lesion. DEGENERATIVE CHANGES: Changes of advanced degenerative disc disease with bridging disc osteophytes involving the vertebral bodies of T4-T11. Endplate remodeling and vacuum disc phenomenon at T11-12 in keeping with change of advanced degenerative disc disease. No high-grade canal stenosis. No high-grade neuroforaminal narrowing. SOFT TISSUES: No paraspinal fluid collection or inflammatory  change identified. No acute abnormality. IMPRESSION: 1. No acute abnormality of the thoracic spine. 2. Advanced degenerative disc disease with bridging disc osteophytes involving T4-T11, with endplate remodeling and vacuum disc phenomenon at T11-12, without high-grade canal stenosis or neuroforaminal narrowing. 3. Diffuse osteopenia. Electronically signed by: Dorethia Molt MD 02/17/2024 09:38 PM EST RP Workstation: [id number]   CT L-SPINE NO CHARGE Result Date: 02/17/2024 EXAM: CT OF THE LUMBAR SPINE WITHOUT CONTRAST 02/17/2024 09:16:19 PM TECHNIQUE: CT of the lumbar spine was performed without the administration of intravenous contrast. Multiplanar reformatted images are provided for review. Automated exposure control, iterative reconstruction, and/or weight based adjustment of the mA/kV was utilized to reduce the radiation dose to as low as reasonably achievable. COMPARISON: None available. CLINICAL HISTORY: FINDINGS: BONES AND ALIGNMENT: L3-L5 anterior and posterior lumbar fusion with instrumentation. Prosthetic lucencies surrounding the right L3 pedicle screw suggest motion at this level. No definite bridging callus identified involving the vertebral body right L3-4 facet joint. Left L3 hemilaminectomy. Bilateral L4 laminotomy. Mild straightening along the fused segments. Normal vertebral body heights. No acute fracture. No listhesis. No suspicious bone lesion. DEGENERATIVE CHANGES: Ankylosis of the facet joints bilaterally at L4-5. No high-grade canal stenosis. No high-grade neural foraminal narrowing. SOFT TISSUES: No acute abnormality. IMPRESSION: 1. No acute findings. 2. L3-L5 anterior  and posterior lumbar fusion with instrumentation, with periprosthetic lucency surrounding the right L3 pedicle screw suggesting motion at this level, and no definite bridging callus identified involving the vertebral body right L3-4 facet joint. 3. Left L3 hemilaminectomy and bilateral L4 laminotomy. 4. Ankylosis of the  facet joints bilaterally at L4-5. Electronically signed by: Dorethia Molt MD 02/17/2024 09:35 PM EST RP Workstation: [id number]   CT CHEST ABDOMEN PELVIS WO CONTRAST Result Date: 02/17/2024 EXAM: CT CHEST, ABDOMEN AND PELVIS WITHOUT CONTRAST 02/17/2024 09:16:19 PM TECHNIQUE: CT of the chest, abdomen and pelvis was performed without the administration of intravenous contrast. Multiplanar reformatted images are provided for review. Automated exposure control, iterative reconstruction, and/or weight based adjustment of the mA/kV was utilized to reduce the radiation dose to as low as reasonably achievable. COMPARISON: Prior examination of 12/09/2014. CLINICAL HISTORY: Fall backwards, head injury, neck and back pain. FINDINGS: CHEST: MEDIASTINUM AND LYMPH NODES: Looks like a subclavian single lead pacemaker is in place, but its lead is within the right ventricle toward the basilar septum. Global cardiac size is at the upper limits of normal. Extensive multivessel coronary artery calcification. No pericardial effusion. The pulmonary arteries are of normal caliber. Moderate atherosclerotic calcification within the thoracic aorta. No aortic aneurysm. The central airways are clear. No pathologic thoracic adenopathy. The esophagus is unremarkable. Visualized thyroid  is unremarkable. LUNGS AND PLEURA: No focal consolidation or pulmonary edema. No pleural effusion or pneumothorax. ABDOMEN AND PELVIS: LIVER: The liver is unremarkable. GALLBLADDER AND BILE DUCTS: Status post cholecystectomy. No biliary ductal dilatation. SPLEEN: No acute abnormality. PANCREAS: No acute abnormality. ADRENAL GLANDS: No acute abnormality. KIDNEYS, URETERS AND BLADDER: 10 mm exophytic lesion arising from the anterior interpolar region of the right kidney demonstrates a mean density of 71 Hounsfield units, compatible with a hyperdense cortical cyst. The kidneys are otherwise unremarkable. No stones in the kidneys or ureters. No hydronephrosis. No  perinephric or periureteral stranding. Urinary bladder is unremarkable. GI AND BOWEL: Few scattered diverticula are seen within the sigmoid colon without superimposed acute inflammatory change. The appendix is normal. The stomach, small bowel, and large bowel are otherwise unremarkable. There is no bowel obstruction. REPRODUCTIVE ORGANS: Status post hysterectomy. No adnexal mass. PERITONEUM AND RETROPERITONEUM: No ascites. No free air. Tiny fat-containing umbilical hernia. VASCULATURE: Aorta is normal in caliber. Iliac atherosclerotic calcification. No aortic aneurysm. ABDOMINAL AND PELVIS LYMPH NODES: No lymphadenopathy. BONES AND SOFT TISSUES: Soft tissue infiltration within the right breast with associated surgical clips noted, consistent with surgical changes of partial right breast resection noted on 01/14/2024. Advanced degenerative changes are seen within the thoracic spine. L3-L5 lumbar fusion with instrumentation has been performed. Advanced asymmetric degenerative changes are seen within the left hip. Osseous structures are diffusely osteopenic. No acute bone abnormality within the thorax, abdomen, and pelvis. No focal soft tissue abnormality in the abdomen and pelvis. IMPRESSION: 1. No acute abnormality of the chest, abdomen, and pelvis related to the fall with head, neck, and back pain. 2. Single-lead right ventricular pacemaker in place. Global cardiac size at the upper limits of normal. Extensive multivessel coronary artery calcification 3. Postsurgical changes of partial right breast resection with associated soft tissue infiltration and surgical clips. Electronically signed by: Dorethia Molt MD 02/17/2024 09:28 PM EST RP Workstation: [id number]   CT Head Wo Contrast Result Date: 02/17/2024 EXAM: CT HEAD AND CERVICAL SPINE 02/17/2024 07:58:18 PM TECHNIQUE: CT of the head and cervical spine was performed without the administration of intravenous contrast. Multiplanar reformatted images are provided  for review.  Automated exposure control, iterative reconstruction, and/or weight based adjustment of the mA/kV was utilized to reduce the radiation dose to as low as reasonably achievable. COMPARISON: None available. CLINICAL HISTORY: trauma trauma FINDINGS: CT HEAD BRAIN AND VENTRICLES: No acute intracranial hemorrhage. No mass effect or midline shift. No abnormal extra-axial fluid collection. No evidence of acute infarct. No hydrocephalus. Cerebral atrophy. ORBITS: No acute abnormality. SINUSES AND MASTOIDS: No acute abnormality. SOFT TISSUES AND SKULL: No acute skull fracture. No acute soft tissue abnormality. CT CERVICAL SPINE BONES AND ALIGNMENT: No acute fracture or traumatic malalignment. DEGENERATIVE CHANGES: Multilevel degenerative changes greatest at C5-C6 where there is severe degenerative disc disease with disc height loss and endplate spurring. Also, facet/uncovertebral hypertrophy at this level with potentially severe foraminal stenosis. SOFT TISSUES: No prevertebral soft tissue swelling. IMPRESSION: 1. No acute intracranial abnormality. 2. No acute fracture or traumatic malalignment of the cervical spine. 3. Severe C5-C6 degenerative change. Electronically signed by: Gilmore Molt MD 02/17/2024 08:09 PM EST RP Workstation: [id number]   CT Cervical Spine Wo Contrast Result Date: 02/17/2024 EXAM: CT HEAD AND CERVICAL SPINE 02/17/2024 07:58:18 PM TECHNIQUE: CT of the head and cervical spine was performed without the administration of intravenous contrast. Multiplanar reformatted images are provided for review. Automated exposure control, iterative reconstruction, and/or weight based adjustment of the mA/kV was utilized to reduce the radiation dose to as low as reasonably achievable. COMPARISON: None available. CLINICAL HISTORY: trauma trauma FINDINGS: CT HEAD BRAIN AND VENTRICLES: No acute intracranial hemorrhage. No mass effect or midline shift. No abnormal extra-axial fluid collection. No evidence  of acute infarct. No hydrocephalus. Cerebral atrophy. ORBITS: No acute abnormality. SINUSES AND MASTOIDS: No acute abnormality. SOFT TISSUES AND SKULL: No acute skull fracture. No acute soft tissue abnormality. CT CERVICAL SPINE BONES AND ALIGNMENT: No acute fracture or traumatic malalignment. DEGENERATIVE CHANGES: Multilevel degenerative changes greatest at C5-C6 where there is severe degenerative disc disease with disc height loss and endplate spurring. Also, facet/uncovertebral hypertrophy at this level with potentially severe foraminal stenosis. SOFT TISSUES: No prevertebral soft tissue swelling. IMPRESSION: 1. No acute intracranial abnormality. 2. No acute fracture or traumatic malalignment of the cervical spine. 3. Severe C5-C6 degenerative change. Electronically signed by: Gilmore Molt MD 02/17/2024 08:09 PM EST RP Workstation: [id number]   DG Chest Port 1 View Result Date: 02/17/2024 EXAM: 1 VIEW(S) XRAY OF THE CHEST 02/17/2024 06:39:00 PM COMPARISON: 12/23/2020 CLINICAL HISTORY: Trauma Trauma FINDINGS: LUNGS AND PLEURA: No focal pulmonary opacity. No pleural effusion. No pneumothorax. HEART AND MEDIASTINUM: Stable cardiomegaly. Left-sided pacemaker is unchanged. BONES AND SOFT TISSUES: No acute osseous abnormality. IMPRESSION: 1. No acute cardiopulmonary process. Stable cardiomegaly and unchanged left-sided pacemaker. Electronically signed by: Lynwood Seip MD 02/17/2024 06:44 PM EST RP Workstation: [id number]   DG Pelvis Portable Result Date: 02/17/2024 EXAM: 1 or 2 VIEW(S) XRAY OF THE PELVIS 02/17/2024 06:39:00 PM COMPARISON: None available. CLINICAL HISTORY: Trauma FINDINGS: BONES AND JOINTS: No acute fracture. No malalignment. Severe degenerative change is seen involving the left hip joint. SOFT TISSUES: The soft tissues are unremarkable. IMPRESSION: 1. No evidence of acute traumatic injury. 2. Severe left hip osteoarthritis. Electronically signed by: Lynwood Seip MD 02/17/2024 06:43 PM EST RP  Workstation: [id number]     Subjective: Patient was seen and examined at bedside.  Overnight events noted. Patient reports feeling better and patient being discharged to SNF today.  Discharge Exam: Vitals:   02/22/24 0303 02/22/24 1024  BP: (!) 146/79 (!) 151/83  Pulse: 74 70  Resp:  18  Temp:  98.2 F (36.8 C)   SpO2: 99% 98%   Vitals:   02/21/24 1951 02/21/24 2134 02/22/24 0303 02/22/24 1024  BP: (!) 154/135 (!) 142/92 (!) 146/79 (!) 151/83  Pulse: 67 74 74 70  Resp:    18  Temp: 98.5 F (36.9 C)  98.2 F (36.8 C)   TempSrc: Oral  Oral   SpO2: 100%  99% 98%  Height:        General: Pt is alert, awake, not in acute distress Cardiovascular: RRR, S1/S2 +, no rubs, no gallops Respiratory: CTA bilaterally, no wheezing, no rhonchi Abdominal: Soft, NT, ND, bowel sounds + Extremities: no edema, no cyanosis    The results of significant diagnostics from this hospitalization (including imaging, microbiology, ancillary and laboratory) are listed below for reference.     Microbiology: No results found for this or any previous visit (from the past 240 hours).   Labs: BNP (last 3 results) No results for input(s): BNP in the last 8760 hours. Basic Metabolic Panel: Recent Labs  Lab 02/18/24 0531 02/19/24 0549 02/20/24 0751 02/21/24 0456 02/22/24 0434  NA 138 135 134* 133* 134*  K 4.3 4.4 4.9 4.2 4.9  CL 106 102 98 98 99  CO2 21* 17* 26 27 23   GLUCOSE 154* 85 210* 177* 159*  BUN 33* 31* 29* 30* 33*  CREATININE 1.72* 1.86* 1.75* 1.72* 1.89*  CALCIUM  8.8* 8.7* 9.2 9.1 8.8*  MG  --   --   --   --  2.1  PHOS  --   --   --   --  3.8   Liver Function Tests: Recent Labs  Lab 02/17/24 1817  AST 19  ALT 14  ALKPHOS 115  BILITOT 0.8  PROT 7.9  ALBUMIN 3.8   No results for input(s): LIPASE, AMYLASE in the last 168 hours. No results for input(s): AMMONIA in the last 168 hours. CBC: Recent Labs  Lab 02/18/24 0531 02/19/24 0549 02/20/24 0751  02/21/24 0456 02/22/24 0434  WBC 11.1* 10.1 11.4* 12.2* 12.2*  HGB 12.7 13.3 13.8 14.4 14.0  HCT 39.5 41.0 42.7 43.6 42.2  MCV 92.1 92.3 92.0 89.5 90.8  PLT 315 314 327 339 324   Cardiac Enzymes: No results for input(s): CKTOTAL, CKMB, CKMBINDEX, TROPONINI in the last 168 hours. BNP: Invalid input(s): POCBNP CBG: Recent Labs  Lab 02/21/24 1138 02/21/24 1648 02/21/24 2127 02/22/24 0625 02/22/24 1112  GLUCAP 189* 169* 171* 153* 165*   D-Dimer No results for input(s): DDIMER in the last 72 hours. Hgb A1c No results for input(s): HGBA1C in the last 72 hours. Lipid Profile No results for input(s): CHOL, HDL, LDLCALC, TRIG, CHOLHDL, LDLDIRECT in the last 72 hours. Thyroid  function studies Recent Labs    02/21/24 0634  TSH 45.240*   Anemia work up Recent Labs    02/21/24 0634  VITAMINB12 1,448*  FOLATE >20.0   Urinalysis    Component Value Date/Time   COLORURINE YELLOW 02/21/2024 1312   APPEARANCEUR TURBID (A) 02/21/2024 1312   LABSPEC 1.010 02/21/2024 1312   PHURINE 6.0 02/21/2024 1312   GLUCOSEU NEGATIVE 02/21/2024 1312   HGBUR SMALL (A) 02/21/2024 1312   BILIRUBINUR NEGATIVE 02/21/2024 1312   KETONESUR NEGATIVE 02/21/2024 1312   PROTEINUR 30 (A) 02/21/2024 1312   NITRITE POSITIVE (A) 02/21/2024 1312   LEUKOCYTESUR LARGE (A) 02/21/2024 1312   Sepsis Labs Recent Labs  Lab 02/19/24 0549 02/20/24 0751 02/21/24 0456 02/22/24 0434  WBC 10.1 11.4* 12.2* 12.2*   Microbiology No results  found for this or any previous visit (from the past 240 hours).   Time coordinating discharge: Over 30 minutes  SIGNED:   Darcel Dawley, MD  [facility] 02/22/2024, 1:28 PM Pager   If 7PM-7AM, please contact night-coverage       [1]  Allergies Allergen Reactions   Bee Venom Shortness Of Breath, Nausea And Vomiting and Other (See Comments)    Makes the patient feel faint, also   Penicillins Anaphylaxis, Hives and Swelling    Lisinopril Cough   Zocor [Simvastatin] Other (See Comments)    memory changes   Liraglutide Rash and Other (See Comments)    Rash at injection site

## 2024-02-22 NOTE — TOC Progression Note (Signed)
 Transition of Care [facility]) - Progression Note    Patient Details  Name: Summer Hawkins MRN: [id number] Date of Birth: [birth year]  Transition of Care [facility]) CM/SW Contact  Bridget Cordella Simmonds, LCSW Phone Number: 02/22/2024, 1:30 PM  Clinical Narrative:   CSW confirmed with [facility]: they can receive pt today.  Per PT, pt does require ambulance transport.    Expected Discharge Plan: Skilled Nursing Facility Barriers to Discharge: Other (must enter comment) (SNF workup)               Expected Discharge Plan and Services         Expected Discharge Date: 02/22/24                         HH Arranged: PT, OT, RN HH Agency: [facility] Date [facility] Agency Contacted: 02/19/24 Time HH Agency Contacted: 1116 Representative spoke with at [facility] Agency: Arna   Social Drivers of Health (SDOH) Interventions SDOH Screenings   Food Insecurity: Unknown (02/19/2024)  Housing: Unknown (02/19/2024)  Transportation Needs: No Transportation Needs (02/19/2024)  Utilities: Not At Risk (02/19/2024)  Depression (PHQ2-9): Low Risk (01/28/2024)  Social Connections: Moderately Isolated (02/19/2024)  Tobacco Use: Low Risk (02/18/2024)    Readmission Risk Interventions     No data to display

## 2024-02-22 NOTE — Discharge Instructions (Signed)
 Advised to follow-up with primary care physician in 1 week. Advised to increase levothyroxine  to 137 mcg daily. Request PCP to recheck TSH T3-T4 in 4 to 6 weeks and adjust medicine accordingly. Advised to discontinue amlodipine .

## 2024-02-22 NOTE — Progress Notes (Signed)
 Report given to Interior And Spatial [employer]er at [facility].

## 2024-02-22 NOTE — Progress Notes (Signed)
 Mobility Specialist Progress Note:    02/22/24 1228  Mobility  Activity Ambulated with assistance  Level of Assistance Contact guard assist, steadying assist  Assistive Device Front wheel walker  Distance Ambulated (ft) 8 ft  Activity Response Tolerated well  Mobility Referral Yes  Mobility visit 1 Mobility  Mobility Specialist Start Time (ACUTE ONLY) 1203  Mobility Specialist Stop Time (ACUTE ONLY) 1219  Mobility Specialist Time Calculation (min) (ACUTE ONLY) 16 min   Returned to assist patient out of BR and attempt ambulation. Pt was able to stand w/o physical assistance from the toilet. Only able to ambulate short distance w/ a chair follow d/t dizziness. BP was 102/59 (72). Left in chair w/ call bell and personal belongings in reach. All needs met. Chair alarm on.  Thersia Minder Mobility Specialist  Please contact vis Secure Chat or  Rehab Office [phone]

## 2024-02-22 NOTE — Telephone Encounter (Signed)
 Tct-Refugia/ left message that I understood that she is to be transferred to [facility] today.  Assusred her that I will follow for needs.

## 2024-02-22 NOTE — Progress Notes (Signed)
 Mobility Specialist Progress Note:    02/22/24 1130  Mobility  Activity Ambulated with assistance  Level of Assistance Moderate assist, patient does 50-74% (+2)  Assistive Device Front wheel walker  Distance Ambulated (ft) 12 ft  Activity Response Tolerated well  Mobility Referral Yes  Mobility visit 1 Mobility  Mobility Specialist Start Time (ACUTE ONLY) 1108  Mobility Specialist Stop Time (ACUTE ONLY) 1125  Mobility Specialist Time Calculation (min) (ACUTE ONLY) 17 min   Pt received in bed agreeable to mobility. ModA +2 for bed mobility and STS. Pt requested to use the BR. Upon standing pt had urinary incontinence. Once in the BR, MS assisted w/ gown and sock change as well as washing up legs. Pt requested some privacy, instructed to use call light when finished.  Thersia Minder Mobility Specialist  Please contact vis Secure Chat or  Rehab Office [phone]

## 2024-02-22 NOTE — Plan of Care (Signed)
   Problem: Education: Goal: Ability to describe self-care measures that may prevent or decrease complications (Diabetes Survival Skills Education) will improve Outcome: Progressing Goal: Individualized Educational Video(s) Outcome: Progressing   Problem: Coping: Goal: Ability to adjust to condition or change in health will improve Outcome: Progressing

## 2024-02-22 NOTE — Care Management Important Message (Signed)
 Important Message  Patient Details  Name: Summer Hawkins MRN: [id number] Date of Birth: September 04, 1934   Important Message Given:  Yes - Medicare IM     Jennie Laneta Dragon 02/22/2024, 2:01 PM

## 2024-02-22 NOTE — Progress Notes (Signed)
 Attempted report x2 to [facility] place with no response . Will attempt again prior to discharge.

## 2024-02-23 LAB — T3, FREE: T3, Free: 1.3 pg/mL — ABNORMAL LOW (ref 2.0–4.4)

## 2024-02-25 ENCOUNTER — Ambulatory Visit: Payer: Medicare Other

## 2024-02-25 ENCOUNTER — Telehealth: Payer: Self-pay

## 2024-02-25 DIAGNOSIS — M4987 Spondylopathy in diseases classified elsewhere, lumbosacral region: Secondary | ICD-10-CM | POA: Diagnosis not present

## 2024-02-25 DIAGNOSIS — C50211 Malignant neoplasm of upper-inner quadrant of right female breast: Secondary | ICD-10-CM | POA: Diagnosis not present

## 2024-02-25 DIAGNOSIS — R2681 Unsteadiness on feet: Secondary | ICD-10-CM | POA: Diagnosis not present

## 2024-02-25 DIAGNOSIS — R001 Bradycardia, unspecified: Secondary | ICD-10-CM

## 2024-02-25 DIAGNOSIS — I951 Orthostatic hypotension: Secondary | ICD-10-CM | POA: Diagnosis not present

## 2024-02-25 DIAGNOSIS — Z9181 History of falling: Secondary | ICD-10-CM | POA: Diagnosis not present

## 2024-02-25 NOTE — Telephone Encounter (Signed)
 Call placed to [facility] place to make aware of patients upcoming appointment with [facility] at [facility]. 02/27/24 @ 335p Spoke with Harlene whom confirmed  patient would be transported. Patient to start radiation treatment after cardiac clearance received.

## 2024-02-26 ENCOUNTER — Ambulatory Visit: Admitting: Radiation Oncology

## 2024-02-26 ENCOUNTER — Ambulatory Visit

## 2024-02-26 NOTE — Progress Notes (Unsigned)
 Electrophysiology Office Note:   Date:  02/27/2024  ID:  Summer Hawkins, DOB 1935-01-05, MRN [id number]  Primary Cardiologist: Lonni Cash, MD Primary Heart Failure: None Electrophysiologist: Danelle Birmingham, MD       History of Present Illness:   Summer Hawkins is a 89 y.o. female with h/o permanent AF, symptomatic bradycardia s/p PPM AF, HFpEF, HTN, HLD, CVA, DM II, CKD III, hypothyroidism, obesity seen today for routine electrophysiology followup.   Last saw cards team 12/11/23 & was pending lumpectomy. Did discuss June 2025 stroke despite appropriately dosed Eliquis  and medication compliance.  In d/w MD, no changes recommended.  Admitted 02/18/24, after a mechanical fall, multiple orthopedic pain, during her stay suffered a syncopal event, described as orthostatic.  Pending start of XRT for her breast cancer (R side)  Since last being seen in our clinic the patient reports doing ok. She is in Rehab currently and expects to go home on Friday 12/19.  She does not have a start date for her XRT.  She states she is planned to have 5 weeks of mega dose radiation (1x weekly).    She denies chest pain, palpitations, dyspnea, PND, orthopnea, nausea, vomiting, dizziness, syncope, edema, weight gain, or early satiety.  Review of systems complete and found to be negative unless listed in HPI.    EP Information / Studies Reviewed:    EKG is not ordered today. EKG from 02/18/24 reviewed which showed VP 70 bpm      PPM Interrogation-  reviewed in detail today,  See PACEART report.  Device History: Medtronic Dual Chamber PPM implanted 12/02/18 for Symptomatic bradycardia > has HIS Bundle in atrial port and and RV lead (no atrial lead as permanent AF)  Risk Assessment/Calculations:    CHA2DS2-VASc Score = 7   This indicates a 11.2% annual risk of stroke. The patient's score is based upon: CHF History: 0 HTN History: 1 Diabetes History: 1 Stroke History: 2 Vascular Disease History:  0 Age Score: 2 Gender Score: 1             Physical Exam:   VS:  BP 132/68   Pulse 78   Ht 5' 4 (1.626 m)   Wt 193 lb (87.5 kg)   LMP  (LMP Unknown)   SpO2 97%   BMI 33.13 kg/m    Wt Readings from Last 3 Encounters:  02/27/24 193 lb (87.5 kg)  02/11/24 199 lb 9.6 oz (90.5 kg)  01/28/24 199 lb (90.3 kg)     GEN: Well nourished, well developed in no acute distress NECK: No JVD; No carotid bruits CARDIAC: Regular rate and rhythm, no murmurs, rubs, gallops. PPM site wnl, no tethering.  RESPIRATORY:  Clear to auscultation without rales, wheezing or rhonchi  ABDOMEN: Soft, non-tender, non-distended EXTREMITIES:  No edema; No deformity   ASSESSMENT AND PLAN:    Symptomatic bradycardia s/p Medtronic PPM  -Normal PPM function -See Pace Art report -No changes today -plan for device check in 6 weeks (anticipate this will be middle of her XRT) and again post  Permanent Atrial Fibrillation  CHA2DS2-VASc 7 -OAC for stroke prophylaxis  -Toprol  25 mg daily   Secondary Hypercoagulable State  -continue Eliquis  2.5mg  BID, dose reviewed and appropriately reduced by age / Cr   Hypertension  -well controlled on current regimen     Disposition:   Follow up with EP APP 6 weeks for repeat device check mid XRT.    Signed, Daphne Barrack, NP-C, AGACNP-BC [facility] -  Electrophysiology  02/27/2024, 4:00 PM

## 2024-02-27 ENCOUNTER — Ambulatory Visit: Attending: Pulmonary Disease | Admitting: Pulmonary Disease

## 2024-02-27 ENCOUNTER — Encounter: Payer: Self-pay | Admitting: Pulmonary Disease

## 2024-02-27 VITALS — BP 132/68 | HR 78 | Ht 64.0 in | Wt 193.0 lb

## 2024-02-27 DIAGNOSIS — C50211 Malignant neoplasm of upper-inner quadrant of right female breast: Secondary | ICD-10-CM | POA: Diagnosis not present

## 2024-02-27 DIAGNOSIS — I951 Orthostatic hypotension: Secondary | ICD-10-CM | POA: Diagnosis not present

## 2024-02-27 DIAGNOSIS — Z950 Presence of cardiac pacemaker: Secondary | ICD-10-CM | POA: Insufficient documentation

## 2024-02-27 DIAGNOSIS — R001 Bradycardia, unspecified: Secondary | ICD-10-CM | POA: Insufficient documentation

## 2024-02-27 DIAGNOSIS — S2341XD Sprain of ribs, subsequent encounter: Secondary | ICD-10-CM | POA: Diagnosis not present

## 2024-02-27 DIAGNOSIS — I10 Essential (primary) hypertension: Secondary | ICD-10-CM | POA: Diagnosis present

## 2024-02-27 DIAGNOSIS — Z9181 History of falling: Secondary | ICD-10-CM | POA: Diagnosis not present

## 2024-02-27 DIAGNOSIS — R2681 Unsteadiness on feet: Secondary | ICD-10-CM | POA: Diagnosis not present

## 2024-02-27 DIAGNOSIS — M4987 Spondylopathy in diseases classified elsewhere, lumbosacral region: Secondary | ICD-10-CM | POA: Diagnosis not present

## 2024-02-27 LAB — CUP PACEART INCLINIC DEVICE CHECK
Date Time Interrogation Session: 20251217165043
Implantable Lead Connection Status: 753985
Implantable Lead Connection Status: 753985
Implantable Lead Implant Date: 20200921
Implantable Lead Implant Date: 20200921
Implantable Lead Location: 753858
Implantable Lead Location: 753860
Implantable Lead Model: 3830
Implantable Lead Model: 5076
Implantable Pulse Generator Implant Date: 20200921

## 2024-02-27 LAB — CUP PACEART REMOTE DEVICE CHECK
Battery Remaining Longevity: 91 mo
Battery Voltage: 2.98 V
Brady Statistic AP VP Percent: 0.07 %
Brady Statistic AP VS Percent: 99.73 %
Brady Statistic AS VP Percent: 0 %
Brady Statistic AS VS Percent: 0.2 %
Brady Statistic RA Percent Paced: 99.8 %
Brady Statistic RV Percent Paced: 0.07 %
Date Time Interrogation Session: 20251215023355
Implantable Lead Connection Status: 753985
Implantable Lead Connection Status: 753985
Implantable Lead Implant Date: 20200921
Implantable Lead Implant Date: 20200921
Implantable Lead Location: 753858
Implantable Lead Location: 753860
Implantable Lead Model: 3830
Implantable Lead Model: 5076
Implantable Pulse Generator Implant Date: 20200921
Lead Channel Impedance Value: 361 Ohm
Lead Channel Impedance Value: 418 Ohm
Lead Channel Impedance Value: 418 Ohm
Lead Channel Impedance Value: 532 Ohm
Lead Channel Sensing Intrinsic Amplitude: 25.125 mV
Lead Channel Sensing Intrinsic Amplitude: 25.125 mV
Lead Channel Sensing Intrinsic Amplitude: 9.5 mV
Lead Channel Sensing Intrinsic Amplitude: 9.5 mV
Lead Channel Setting Pacing Amplitude: 2 V
Lead Channel Setting Pacing Amplitude: 2.5 V
Lead Channel Setting Pacing Pulse Width: 0.4 ms
Lead Channel Setting Sensing Sensitivity: 1.2 mV
Zone Setting Status: 755011
Zone Setting Status: 755011

## 2024-02-27 NOTE — Patient Instructions (Signed)
 Medication Instructions:  Your physician recommends that you continue on your current medications as directed. Please refer to the Current Medication list given to you today.  *If you need a refill on your cardiac medications before your next appointment, please call your pharmacy*  Lab Work: None ordered If you have labs (blood work) drawn today and your tests are completely normal, you will receive your results only by: MyChart Message (if you have MyChart) OR A paper copy in the mail If you have any lab test that is abnormal or we need to change your treatment, we will call you to review the results.  Follow-Up: At [facility], you and your health needs are our priority.  As part of our continuing mission to provide you with exceptional heart care, our providers are all part of one team.  This team includes your primary Cardiologist (physician) and Advanced Practice Providers or APPs (Physician Assistants and Nurse Practitioners) who all work together to provide you with the care you need, when you need it.  Your next appointment:   6 week(s)  Provider:   Daphne Barrack, NP

## 2024-02-27 NOTE — Telephone Encounter (Signed)
 LVM for patient asking for call back to determine how to proceed with genetic testing.   Received notice from [facility] that they had a sample failure with del/dup analysis. Ideally, would send an additional sample (either repeat saliva or blood) for complete analysis.

## 2024-02-29 ENCOUNTER — Encounter: Payer: Self-pay | Admitting: Internal Medicine

## 2024-02-29 NOTE — Progress Notes (Signed)
 Remote PPM Transmission

## 2024-02-29 NOTE — Progress Notes (Signed)
 TO BE COMPLETED BY RADIATION ONCOLOGIST OFFICE:   Patient Name: ISHA SEEFELD   Date of Birth: Nov 18, 1934   Radiation Oncologist: Dr. Lynwood Nasuti   Site to be Treated: Right breast   Will x-rays >10 MV be used? No   Will the radiation be >10 cm from the device? yes   Planned Treatment Start Date: CT sim 02/13/24   TO BE COMPLETED BY CARDIOLOGIST OFFICE:   Device Information:  Pacemaker [x]      ICD []    Brand: Medtronic: [phone]  Model #: Medtronic [id number] [address] MRI  Serial Number: [id number]     Date of Placement: 12/02/2018  Site of Placement: Chest  Remote Device Check--Frequency: Every 91 days  Last Check: 02/27/2024  Is the Patient Pacer Dependent?:  Yes [x]   No []   Does cardiologist request Radiation Oncology to schedule device testing by vendor for the following:  Prior to the Initiation of Treatments?  Yes []  No [x]  During Treatments?  Yes []  No [x]  Post Radiation Treatments?  Yes []  No [x]   Is device monitoring necessary by vendor/cardiologist team during treatments?  Yes []   No [x]   Is cardiac monitoring by Radiation Oncology nursing necessary during treatments? Yes []   No [x]   Do you recommend device be relocated prior to Radiation Treatment? Yes []   No [x]   **PLEASE LIST ANY NOTES OR SPECIAL REQUESTS:       CARDIOLOGIST SIGNATURE:  Dr. Danelle Birmingham Per Device Clinic Standing Orders, Almarie ONEIDA Shutter  02/29/2024 9:17 AM  **Please route completed form back to Radiation Oncology Nursing and P [facility] RAD ONC ADMIN, OR send an update if there will be a delay in having form completed by expected start date.  **Call [phone] if you have any questions or do not get an in-basket response from a Radiation Oncology staff member

## 2024-03-04 ENCOUNTER — Ambulatory Visit
Admission: RE | Admit: 2024-03-04 | Discharge: 2024-03-04 | Disposition: A | Discharge status: Home / Self Care | Source: Ambulatory Visit | Attending: Radiation Oncology | Admitting: Radiation Oncology

## 2024-03-04 ENCOUNTER — Other Ambulatory Visit: Payer: Self-pay

## 2024-03-04 DIAGNOSIS — C50211 Malignant neoplasm of upper-inner quadrant of right female breast: Secondary | ICD-10-CM

## 2024-03-04 DIAGNOSIS — Z170 Estrogen receptor positive status [ER+]: Secondary | ICD-10-CM | POA: Diagnosis present

## 2024-03-04 DIAGNOSIS — Z510 Encounter for antineoplastic radiation therapy: Secondary | ICD-10-CM | POA: Diagnosis present

## 2024-03-04 LAB — RAD ONC ARIA SESSION SUMMARY
Course Elapsed Days: 0
Plan Fractions Treated to Date: 1
Plan Prescribed Dose Per Fraction: 5.7 Gy
Plan Total Fractions Prescribed: 5
Plan Total Prescribed Dose: 28.5 Gy
Reference Point Dosage Given to Date: 5.7 Gy
Reference Point Session Dosage Given: 5.7 Gy
Session Number: 1

## 2024-03-04 MED ORDER — ALRA NON-METALLIC DEODORANT (RAD-ONC)
1.0000 | Freq: Once | TOPICAL | Status: AC
  Administered 2024-03-04: 1 via TOPICAL

## 2024-03-04 MED ORDER — RADIAPLEXRX EX GEL: Freq: Once | CUTANEOUS | Status: AC

## 2024-03-09 ENCOUNTER — Ambulatory Visit: Payer: Self-pay | Admitting: Internal Medicine

## 2024-03-11 ENCOUNTER — Ambulatory Visit
Admission: RE | Admit: 2024-03-11 | Discharge: 2024-03-11 | Disposition: A | Discharge status: Home / Self Care | Source: Ambulatory Visit | Attending: Radiation Oncology | Admitting: Radiation Oncology

## 2024-03-11 ENCOUNTER — Other Ambulatory Visit: Payer: Self-pay

## 2024-03-11 DIAGNOSIS — Z510 Encounter for antineoplastic radiation therapy: Secondary | ICD-10-CM | POA: Diagnosis not present

## 2024-03-11 LAB — RAD ONC ARIA SESSION SUMMARY
Course Elapsed Days: 7
Plan Fractions Treated to Date: 2
Plan Prescribed Dose Per Fraction: 5.7 Gy
Plan Total Fractions Prescribed: 5
Plan Total Prescribed Dose: 28.5 Gy
Reference Point Dosage Given to Date: 11.4 Gy
Reference Point Session Dosage Given: 5.7 Gy
Session Number: 2

## 2024-03-17 ENCOUNTER — Other Ambulatory Visit: Payer: Self-pay | Admitting: Physician Assistant

## 2024-03-17 ENCOUNTER — Encounter: Payer: Self-pay | Admitting: *Deleted

## 2024-03-17 NOTE — Congregational Nurse Program (Unsigned)
 Tcf Shawna-requesting transportation from home to [facility] for radiation therapy. Agreed to go with her to the appointment as requester and to transport visit will be Tuesday [id number] at 1100 am

## 2024-03-17 NOTE — Patient Instructions (Signed)
 Follow allinstruction for after care. Patient has my number and family's numbers.

## 2024-03-18 ENCOUNTER — Ambulatory Visit
Admission: RE | Admit: 2024-03-18 | Discharge: 2024-03-18 | Disposition: A | Discharge status: Home / Self Care | Source: Ambulatory Visit | Attending: Radiation Oncology | Admitting: Radiation Oncology

## 2024-03-18 ENCOUNTER — Other Ambulatory Visit: Payer: Self-pay

## 2024-03-18 DIAGNOSIS — C50211 Malignant neoplasm of upper-inner quadrant of right female breast: Secondary | ICD-10-CM | POA: Diagnosis present

## 2024-03-18 DIAGNOSIS — Z170 Estrogen receptor positive status [ER+]: Secondary | ICD-10-CM | POA: Diagnosis present

## 2024-03-18 LAB — RAD ONC ARIA SESSION SUMMARY
Course Elapsed Days: 14
Plan Fractions Treated to Date: 3
Plan Prescribed Dose Per Fraction: 5.7 Gy
Plan Total Fractions Prescribed: 5
Plan Total Prescribed Dose: 28.5 Gy
Reference Point Dosage Given to Date: 17.1 Gy
Reference Point Session Dosage Given: 5.7 Gy
Session Number: 3

## 2024-03-18 NOTE — Telephone Encounter (Signed)
 Prescription refill request for Eliquis  received. Indication:afib Last office visit:12/25 Scr: 1.89  12/25 Age:89 Weight:87.5  kg  Prescription refilled

## 2024-03-24 NOTE — Telephone Encounter (Signed)
 Unable to reach patient. New sample will need to be submitted if still interested in completing genetic testing for hereditary cancer. LVM explaining, requested call back to coordinate.

## 2024-03-25 ENCOUNTER — Ambulatory Visit
Admission: RE | Admit: 2024-03-25 | Discharge: 2024-03-25 | Disposition: A | Source: Ambulatory Visit | Attending: Radiation Oncology | Admitting: Radiation Oncology

## 2024-03-25 ENCOUNTER — Ambulatory Visit

## 2024-03-25 ENCOUNTER — Other Ambulatory Visit: Payer: Self-pay

## 2024-03-25 DIAGNOSIS — C50211 Malignant neoplasm of upper-inner quadrant of right female breast: Secondary | ICD-10-CM | POA: Diagnosis not present

## 2024-03-25 LAB — RAD ONC ARIA SESSION SUMMARY
Course Elapsed Days: 21
Plan Fractions Treated to Date: 4
Plan Prescribed Dose Per Fraction: 5.7 Gy
Plan Total Fractions Prescribed: 5
Plan Total Prescribed Dose: 28.5 Gy
Reference Point Dosage Given to Date: 22.8 Gy
Reference Point Session Dosage Given: 5.7 Gy
Session Number: 4

## 2024-04-01 ENCOUNTER — Ambulatory Visit
Admission: RE | Admit: 2024-04-01 | Discharge: 2024-04-01 | Disposition: A | Source: Ambulatory Visit | Attending: Radiation Oncology | Admitting: Radiation Oncology

## 2024-04-01 ENCOUNTER — Other Ambulatory Visit: Payer: Self-pay

## 2024-04-01 DIAGNOSIS — Z170 Estrogen receptor positive status [ER+]: Secondary | ICD-10-CM

## 2024-04-01 DIAGNOSIS — C50211 Malignant neoplasm of upper-inner quadrant of right female breast: Secondary | ICD-10-CM | POA: Diagnosis not present

## 2024-04-01 LAB — RAD ONC ARIA SESSION SUMMARY
Course Elapsed Days: 28
Plan Fractions Treated to Date: 5
Plan Prescribed Dose Per Fraction: 5.7 Gy
Plan Total Fractions Prescribed: 5
Plan Total Prescribed Dose: 28.5 Gy
Reference Point Dosage Given to Date: 28.5 Gy
Reference Point Session Dosage Given: 5.7 Gy
Session Number: 5

## 2024-04-01 MED ORDER — RADIAPLEXRX EX GEL: Freq: Once | CUTANEOUS | Status: AC

## 2024-04-02 NOTE — Radiation Completion Notes (Addendum)
" °  Radiation Oncology         [phone] ________________________________  Name: Summer Hawkins MRN: [id number]  Date of Service: 04/01/2024  DOB: 1934/06/03  End of Treatment Note  Diagnosis: Stage IIB (cT2, N0, M0) High Grade Invasive ductal carcinoma of the right breast; ER+/ PR+/ HER2- Intent: Curative     ==========DELIVERED PLANS==========  First Treatment Date: 2024-03-04 Last Treatment Date: 2024-04-01   Plan Name: Breast_R Site: Breast, Right Technique: 3D Mode: Photon Dose Per Fraction: 5.7 Gy Prescribed Dose (Delivered / Prescribed): 28.5 Gy / 28.5 Gy Prescribed Fxs (Delivered / Prescribed): 5 / 5     ====================================   The patient tolerated radiation. She developed fatigue and anticipated skin changes in the treatment field.   The patient will return in one month and will continue follow up with Dr. Lanny as well.      Ronita Due, PA-C "

## 2024-04-17 ENCOUNTER — Ambulatory Visit: Admitting: Pulmonary Disease

## 2024-04-29 ENCOUNTER — Inpatient Hospital Stay: Attending: Hematology | Admitting: Nurse Practitioner

## 2024-05-06 ENCOUNTER — Ambulatory Visit: Admitting: Radiology

## 2024-06-06 ENCOUNTER — Ambulatory Visit: Admitting: Pulmonary Disease

## 2024-07-17 ENCOUNTER — Ambulatory Visit: Admitting: Cardiovascular Disease

## 2024-07-28 ENCOUNTER — Inpatient Hospital Stay: Attending: Hematology

## 2024-07-28 ENCOUNTER — Inpatient Hospital Stay: Admitting: Hematology
